# Patient Record
Sex: Female | Born: 1980 | Race: Black or African American | Hispanic: No | Marital: Single | Smoking: Never smoker
Health system: Southern US, Community
[De-identification: ages and names within clinical notes are randomized; demographics above are authoritative.]

## PROBLEM LIST (undated history)

## (undated) DIAGNOSIS — E119 Type 2 diabetes mellitus without complications: Secondary | ICD-10-CM

## (undated) DIAGNOSIS — R112 Nausea with vomiting, unspecified: Secondary | ICD-10-CM

## (undated) DIAGNOSIS — Z98811 Dental restoration status: Secondary | ICD-10-CM

## (undated) DIAGNOSIS — D352 Benign neoplasm of pituitary gland: Secondary | ICD-10-CM

## (undated) DIAGNOSIS — Z9889 Other specified postprocedural states: Secondary | ICD-10-CM

## (undated) DIAGNOSIS — F419 Anxiety disorder, unspecified: Secondary | ICD-10-CM

## (undated) DIAGNOSIS — G4730 Sleep apnea, unspecified: Secondary | ICD-10-CM

## (undated) DIAGNOSIS — N63 Unspecified lump in unspecified breast: Secondary | ICD-10-CM

## (undated) DIAGNOSIS — I10 Essential (primary) hypertension: Secondary | ICD-10-CM

## (undated) DIAGNOSIS — K8591 Acute pancreatitis with uninfected necrosis, unspecified: Secondary | ICD-10-CM

## (undated) DIAGNOSIS — T8859XA Other complications of anesthesia, initial encounter: Secondary | ICD-10-CM

## (undated) DIAGNOSIS — J302 Other seasonal allergic rhinitis: Secondary | ICD-10-CM

## (undated) DIAGNOSIS — G43909 Migraine, unspecified, not intractable, without status migrainosus: Secondary | ICD-10-CM

## (undated) HISTORY — DX: Sleep apnea, unspecified: G47.30

## (undated) HISTORY — DX: Essential (primary) hypertension: I10

## (undated) HISTORY — PX: TONSILLECTOMY: SUR1361

## (undated) HISTORY — DX: Benign neoplasm of pituitary gland: D35.2

## (undated) HISTORY — PX: MICROLARYNGOSCOPY WITH LASER: SHX5972

---

## 1999-12-31 ENCOUNTER — Encounter: Payer: Self-pay | Admitting: Otolaryngology

## 1999-12-31 ENCOUNTER — Encounter: Admission: RE | Admit: 1999-12-31 | Discharge: 1999-12-31 | Payer: Self-pay | Admitting: Otolaryngology

## 2000-04-28 ENCOUNTER — Ambulatory Visit (HOSPITAL_BASED_OUTPATIENT_CLINIC_OR_DEPARTMENT_OTHER): Admission: RE | Admit: 2000-04-28 | Discharge: 2000-04-28 | Payer: Self-pay | Admitting: Otolaryngology

## 2000-12-17 ENCOUNTER — Ambulatory Visit (HOSPITAL_BASED_OUTPATIENT_CLINIC_OR_DEPARTMENT_OTHER): Admission: RE | Admit: 2000-12-17 | Discharge: 2000-12-17 | Payer: Self-pay | Admitting: Otolaryngology

## 2000-12-17 ENCOUNTER — Encounter (INDEPENDENT_AMBULATORY_CARE_PROVIDER_SITE_OTHER): Payer: Self-pay | Admitting: *Deleted

## 2001-03-31 ENCOUNTER — Encounter: Payer: Self-pay | Admitting: *Deleted

## 2001-03-31 ENCOUNTER — Emergency Department (HOSPITAL_COMMUNITY): Admission: EM | Admit: 2001-03-31 | Discharge: 2001-03-31 | Payer: Self-pay | Admitting: *Deleted

## 2001-08-03 ENCOUNTER — Ambulatory Visit (HOSPITAL_COMMUNITY): Admission: RE | Admit: 2001-08-03 | Discharge: 2001-08-03 | Payer: Self-pay | Admitting: Family Medicine

## 2001-08-03 ENCOUNTER — Encounter: Payer: Self-pay | Admitting: Family Medicine

## 2001-08-25 ENCOUNTER — Other Ambulatory Visit: Admission: RE | Admit: 2001-08-25 | Discharge: 2001-08-25 | Payer: Self-pay | Admitting: Family Medicine

## 2001-12-31 ENCOUNTER — Emergency Department (HOSPITAL_COMMUNITY): Admission: EM | Admit: 2001-12-31 | Discharge: 2001-12-31 | Payer: Self-pay | Admitting: Emergency Medicine

## 2001-12-31 ENCOUNTER — Encounter: Payer: Self-pay | Admitting: Emergency Medicine

## 2002-01-13 ENCOUNTER — Encounter (HOSPITAL_COMMUNITY): Admission: RE | Admit: 2002-01-13 | Discharge: 2002-02-12 | Payer: Self-pay | Admitting: Family Medicine

## 2002-02-27 ENCOUNTER — Emergency Department (HOSPITAL_COMMUNITY): Admission: EM | Admit: 2002-02-27 | Discharge: 2002-02-27 | Payer: Self-pay | Admitting: Emergency Medicine

## 2003-02-21 ENCOUNTER — Encounter: Payer: Self-pay | Admitting: Family Medicine

## 2003-02-21 ENCOUNTER — Ambulatory Visit (HOSPITAL_COMMUNITY): Admission: RE | Admit: 2003-02-21 | Discharge: 2003-02-21 | Payer: Self-pay | Admitting: Family Medicine

## 2004-08-22 ENCOUNTER — Ambulatory Visit: Payer: Self-pay | Admitting: Family Medicine

## 2004-10-23 ENCOUNTER — Ambulatory Visit: Payer: Self-pay | Admitting: Family Medicine

## 2004-12-23 ENCOUNTER — Ambulatory Visit: Payer: Self-pay | Admitting: Family Medicine

## 2004-12-24 ENCOUNTER — Encounter (INDEPENDENT_AMBULATORY_CARE_PROVIDER_SITE_OTHER): Payer: Self-pay | Admitting: *Deleted

## 2004-12-24 LAB — CONVERTED CEMR LAB: Pap Smear: NORMAL

## 2004-12-26 ENCOUNTER — Ambulatory Visit (HOSPITAL_COMMUNITY): Admission: RE | Admit: 2004-12-26 | Discharge: 2004-12-26 | Payer: Self-pay | Admitting: Family Medicine

## 2005-03-28 ENCOUNTER — Ambulatory Visit: Payer: Self-pay | Admitting: Family Medicine

## 2005-04-29 ENCOUNTER — Ambulatory Visit: Payer: Self-pay | Admitting: Family Medicine

## 2005-05-06 ENCOUNTER — Ambulatory Visit (HOSPITAL_COMMUNITY): Admission: RE | Admit: 2005-05-06 | Discharge: 2005-05-06 | Payer: Self-pay | Admitting: Family Medicine

## 2005-07-28 ENCOUNTER — Ambulatory Visit: Payer: Self-pay | Admitting: Family Medicine

## 2006-06-10 ENCOUNTER — Ambulatory Visit: Payer: Self-pay | Admitting: Family Medicine

## 2006-10-27 ENCOUNTER — Ambulatory Visit (HOSPITAL_COMMUNITY): Admission: RE | Admit: 2006-10-27 | Discharge: 2006-10-27 | Payer: Self-pay | Admitting: Family Medicine

## 2006-10-27 ENCOUNTER — Ambulatory Visit: Payer: Self-pay | Admitting: Family Medicine

## 2006-12-20 ENCOUNTER — Emergency Department (HOSPITAL_COMMUNITY): Admission: EM | Admit: 2006-12-20 | Discharge: 2006-12-20 | Payer: Self-pay | Admitting: Emergency Medicine

## 2007-01-14 ENCOUNTER — Ambulatory Visit: Payer: Self-pay | Admitting: Family Medicine

## 2007-01-14 LAB — HM MAMMOGRAPHY: HM Mammogram: NORMAL

## 2007-01-16 ENCOUNTER — Encounter: Payer: Self-pay | Admitting: Family Medicine

## 2007-02-23 ENCOUNTER — Ambulatory Visit: Payer: Self-pay | Admitting: Family Medicine

## 2007-10-21 ENCOUNTER — Encounter: Payer: Self-pay | Admitting: Family Medicine

## 2008-02-08 ENCOUNTER — Ambulatory Visit: Payer: Self-pay | Admitting: Family Medicine

## 2008-02-17 ENCOUNTER — Encounter (INDEPENDENT_AMBULATORY_CARE_PROVIDER_SITE_OTHER): Payer: Self-pay | Admitting: *Deleted

## 2008-02-17 DIAGNOSIS — D352 Benign neoplasm of pituitary gland: Secondary | ICD-10-CM | POA: Insufficient documentation

## 2008-02-17 DIAGNOSIS — D353 Benign neoplasm of craniopharyngeal duct: Secondary | ICD-10-CM

## 2008-02-17 DIAGNOSIS — J329 Chronic sinusitis, unspecified: Secondary | ICD-10-CM | POA: Insufficient documentation

## 2008-02-17 DIAGNOSIS — E669 Obesity, unspecified: Secondary | ICD-10-CM | POA: Insufficient documentation

## 2008-02-17 DIAGNOSIS — J302 Other seasonal allergic rhinitis: Secondary | ICD-10-CM | POA: Insufficient documentation

## 2008-02-17 DIAGNOSIS — N62 Hypertrophy of breast: Secondary | ICD-10-CM | POA: Insufficient documentation

## 2008-02-17 DIAGNOSIS — J3089 Other allergic rhinitis: Secondary | ICD-10-CM | POA: Insufficient documentation

## 2008-05-03 ENCOUNTER — Encounter: Payer: Self-pay | Admitting: Family Medicine

## 2008-05-03 LAB — CONVERTED CEMR LAB
ALT: 20 units/L (ref 0–35)
AST: 15 units/L (ref 0–37)
Albumin: 4.4 g/dL (ref 3.5–5.2)
Alkaline Phosphatase: 39 units/L (ref 39–117)
BUN: 10 mg/dL (ref 6–23)
Basophils Absolute: 0.1 10*3/uL (ref 0.0–0.1)
Basophils Relative: 1 % (ref 0–1)
Bilirubin, Direct: 0.1 mg/dL (ref 0.0–0.3)
CO2: 22 meq/L (ref 19–32)
Calcium: 9.7 mg/dL (ref 8.4–10.5)
Chloride: 105 meq/L (ref 96–112)
Cholesterol: 177 mg/dL (ref 0–200)
Creatinine, Ser: 0.68 mg/dL (ref 0.40–1.20)
Eosinophils Absolute: 0.1 10*3/uL (ref 0.0–0.7)
Eosinophils Relative: 2 % (ref 0–5)
Glucose, Bld: 94 mg/dL (ref 70–99)
HCT: 41.5 % (ref 36.0–46.0)
HDL: 37 mg/dL — ABNORMAL LOW (ref >39)
Hemoglobin: 12.9 g/dL (ref 12.0–15.0)
Indirect Bilirubin: 0.5 mg/dL (ref 0.0–0.9)
LDL Cholesterol: 121 mg/dL — ABNORMAL HIGH (ref 0–99)
Lymphocytes Relative: 45 % (ref 12–46)
Lymphs Abs: 2.6 10*3/uL (ref 0.7–4.0)
MCHC: 31.1 g/dL (ref 30.0–36.0)
MCV: 80.9 fL (ref 78.0–100.0)
Monocytes Absolute: 0.5 10*3/uL (ref 0.1–1.0)
Monocytes Relative: 8 % (ref 3–12)
Neutro Abs: 2.6 10*3/uL (ref 1.7–7.7)
Neutrophils Relative %: 44 % (ref 43–77)
Platelets: 402 10*3/uL — ABNORMAL HIGH (ref 150–400)
Potassium: 3.9 meq/L (ref 3.5–5.3)
Prolactin: 84.3 ng/mL
RBC: 5.13 M/uL — ABNORMAL HIGH (ref 3.87–5.11)
RDW: 13.4 % (ref 11.5–15.5)
Sodium: 141 meq/L (ref 135–145)
TSH: 2.292 microintl units/mL (ref 0.350–4.50)
Total Bilirubin: 0.6 mg/dL (ref 0.3–1.2)
Total CHOL/HDL Ratio: 4.8
Total Protein: 7 g/dL (ref 6.0–8.3)
Triglycerides: 94 mg/dL (ref <150)
VLDL: 19 mg/dL (ref 0–40)
WBC: 5.8 10*3/uL (ref 4.0–10.5)

## 2008-05-04 ENCOUNTER — Ambulatory Visit: Payer: Self-pay | Admitting: Family Medicine

## 2008-05-04 ENCOUNTER — Encounter: Payer: Self-pay | Admitting: Family Medicine

## 2008-05-04 ENCOUNTER — Other Ambulatory Visit: Admission: RE | Admit: 2008-05-04 | Discharge: 2008-05-04 | Payer: Self-pay | Admitting: Family Medicine

## 2008-05-06 ENCOUNTER — Encounter: Payer: Self-pay | Admitting: Family Medicine

## 2008-05-06 LAB — CONVERTED CEMR LAB
Candida species: NEGATIVE
Chlamydia, DNA Probe: NEGATIVE
GC Probe Amp, Genital: NEGATIVE
Gardnerella vaginalis: NEGATIVE
Trichomonal Vaginitis: NEGATIVE

## 2008-05-10 LAB — CONVERTED CEMR LAB: Pap Smear: NORMAL

## 2008-05-11 ENCOUNTER — Ambulatory Visit (HOSPITAL_COMMUNITY): Admission: RE | Admit: 2008-05-11 | Discharge: 2008-05-11 | Payer: Self-pay | Admitting: Family Medicine

## 2008-05-31 ENCOUNTER — Ambulatory Visit: Payer: Self-pay | Admitting: Endocrinology

## 2008-05-31 DIAGNOSIS — R519 Headache, unspecified: Secondary | ICD-10-CM | POA: Insufficient documentation

## 2008-05-31 DIAGNOSIS — E229 Hyperfunction of pituitary gland, unspecified: Secondary | ICD-10-CM | POA: Insufficient documentation

## 2008-05-31 DIAGNOSIS — R51 Headache: Secondary | ICD-10-CM

## 2008-05-31 DIAGNOSIS — N643 Galactorrhea not associated with childbirth: Secondary | ICD-10-CM | POA: Insufficient documentation

## 2008-06-05 ENCOUNTER — Telehealth (INDEPENDENT_AMBULATORY_CARE_PROVIDER_SITE_OTHER): Payer: Self-pay | Admitting: *Deleted

## 2008-07-03 ENCOUNTER — Ambulatory Visit: Payer: Self-pay | Admitting: Endocrinology

## 2008-07-17 ENCOUNTER — Ambulatory Visit: Payer: Self-pay | Admitting: Family Medicine

## 2008-07-17 DIAGNOSIS — N949 Unspecified condition associated with female genital organs and menstrual cycle: Secondary | ICD-10-CM

## 2008-07-17 DIAGNOSIS — N925 Other specified irregular menstruation: Secondary | ICD-10-CM | POA: Insufficient documentation

## 2008-07-17 DIAGNOSIS — I10 Essential (primary) hypertension: Secondary | ICD-10-CM | POA: Insufficient documentation

## 2008-07-17 DIAGNOSIS — N938 Other specified abnormal uterine and vaginal bleeding: Secondary | ICD-10-CM | POA: Insufficient documentation

## 2008-08-22 ENCOUNTER — Encounter: Payer: Self-pay | Admitting: Endocrinology

## 2008-08-24 ENCOUNTER — Encounter: Payer: Self-pay | Admitting: Family Medicine

## 2008-08-24 ENCOUNTER — Ambulatory Visit: Payer: Self-pay | Admitting: Endocrinology

## 2008-08-24 LAB — CONVERTED CEMR LAB: Prolactin: 92.8 ng/mL

## 2008-09-19 ENCOUNTER — Ambulatory Visit: Payer: Self-pay | Admitting: Family Medicine

## 2008-09-19 DIAGNOSIS — M542 Cervicalgia: Secondary | ICD-10-CM | POA: Insufficient documentation

## 2009-05-01 ENCOUNTER — Ambulatory Visit: Payer: Self-pay | Admitting: Family Medicine

## 2009-05-06 DIAGNOSIS — J0100 Acute maxillary sinusitis, unspecified: Secondary | ICD-10-CM | POA: Insufficient documentation

## 2009-07-26 ENCOUNTER — Ambulatory Visit: Payer: Self-pay | Admitting: Family Medicine

## 2009-07-26 DIAGNOSIS — N760 Acute vaginitis: Secondary | ICD-10-CM | POA: Insufficient documentation

## 2009-07-26 DIAGNOSIS — R5383 Other fatigue: Secondary | ICD-10-CM

## 2009-07-26 DIAGNOSIS — R5381 Other malaise: Secondary | ICD-10-CM | POA: Insufficient documentation

## 2009-07-27 ENCOUNTER — Encounter: Payer: Self-pay | Admitting: Family Medicine

## 2009-07-27 LAB — CONVERTED CEMR LAB
Chlamydia, DNA Probe: NEGATIVE
GC Probe Amp, Genital: NEGATIVE

## 2009-07-28 ENCOUNTER — Encounter: Payer: Self-pay | Admitting: Family Medicine

## 2009-08-03 ENCOUNTER — Encounter: Payer: Self-pay | Admitting: Family Medicine

## 2009-08-08 LAB — CONVERTED CEMR LAB
Candida species: NEGATIVE
Gardnerella vaginalis: POSITIVE — AB

## 2009-08-21 ENCOUNTER — Telehealth: Payer: Self-pay | Admitting: Family Medicine

## 2009-10-24 ENCOUNTER — Encounter: Payer: Self-pay | Admitting: Family Medicine

## 2009-10-24 ENCOUNTER — Encounter: Payer: Self-pay | Admitting: Endocrinology

## 2009-10-25 ENCOUNTER — Other Ambulatory Visit: Admission: RE | Admit: 2009-10-25 | Discharge: 2009-10-25 | Payer: Self-pay | Admitting: Family Medicine

## 2009-10-25 ENCOUNTER — Ambulatory Visit: Payer: Self-pay | Admitting: Family Medicine

## 2009-11-12 ENCOUNTER — Telehealth: Payer: Self-pay | Admitting: Family Medicine

## 2009-11-13 ENCOUNTER — Ambulatory Visit: Payer: Self-pay | Admitting: Endocrinology

## 2009-11-14 ENCOUNTER — Encounter: Payer: Self-pay | Admitting: Family Medicine

## 2010-08-27 ENCOUNTER — Telehealth: Payer: Self-pay | Admitting: Family Medicine

## 2010-08-27 ENCOUNTER — Emergency Department (HOSPITAL_COMMUNITY): Admission: EM | Admit: 2010-08-27 | Discharge: 2010-08-27 | Payer: Self-pay | Admitting: Emergency Medicine

## 2010-09-27 ENCOUNTER — Telehealth (INDEPENDENT_AMBULATORY_CARE_PROVIDER_SITE_OTHER): Payer: Self-pay | Admitting: *Deleted

## 2010-10-02 ENCOUNTER — Telehealth (INDEPENDENT_AMBULATORY_CARE_PROVIDER_SITE_OTHER): Payer: Self-pay | Admitting: *Deleted

## 2010-10-16 ENCOUNTER — Ambulatory Visit: Payer: Self-pay | Admitting: Family Medicine

## 2010-10-17 LAB — HM MAMMOGRAPHY

## 2010-11-10 ENCOUNTER — Encounter: Payer: Self-pay | Admitting: Family Medicine

## 2010-11-19 NOTE — Letter (Signed)
Summary: x rays  x rays   Imported By: Lind Guest 03/01/2010 14:18:34  _____________________________________________________________________  External Attachment:    Type:   Image     Comment:   External Document

## 2010-11-19 NOTE — Assessment & Plan Note (Signed)
Summary: f/u appt/#/cd   Vital Signs:  Patient profile:   30 year old female Menstrual status:  irregular Height:      64.5 inches (163.83 cm) Weight:      209.38 pounds (95.17 kg) O2 Sat:      97 % on Room air Temp:     97.5 degrees F (36.39 degrees C) oral Pulse rate:   94 / minute BP sitting:   118 / 72  (left arm) Cuff size:   large  Vitals Entered By: Josph Macho CMA (November 13, 2009 1:12 PM)  O2 Flow:  Room air CC: Follow-up visit/ CF   Referring Provider:  Dr. Lodema Hong Primary Provider:  Syliva Overman MD  CC:  Follow-up visit/ CF.  History of Present Illness: pt has been off cabergoline "for a long time--i do not even remember."  she still has intermittent headache.  she says she is not as risk for pregnancy, and does not wish to have a pregnancy.  she denies galactorrhea.  her last menstrual period was 2005.    Current Medications (verified): 1)  Maxzide-25 37.5-25 Mg Tabs (Triamterene-Hctz) .... Take 1 Tablet By Mouth Once A Day 2)  Ibuprofen 800 Mg Tabs (Ibuprofen) .... Take 1 Tablet By Mouth Two Times A Day As Needed Maximum Twice Weekly For Severe Headache1 3)  Restoril 30 Mg Caps (Temazepam) .... Take 1 Capsule By Mouth At Bedtime 4)  Singulair 10 Mg Tabs (Montelukast Sodium) .... Take 1 Tablet By Mouth Once A Day 5)  Astepro 0.15 % Soln (Azelastine Hcl) .... 2 Puffs Per Nostril Twice Daily  Allergies (verified): 1)  ! 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    Physical Exam  General:  obese.  no distress  Msk:  gait is normal and steady. Extremities:  no edema. Additional Exam:  tsh=2.4 prolactin=115   Impression & Recommendations:  Problem # 1:  HYPERPROLACTINEMIA (ICD-253.1) needs  increased rx  Medications Added to Medication List This Visit: 1)  Cabergoline 0.5 Mg Tabs (Cabergoline) .Marland Kitchen.. 1 tab twice a week  Other Orders: Est. Patient Level III (04540)  Patient Instructions: 1)  resume cabergoline 1 mg twice a week (the first 2 weeks, take just 1/2 pill twice a week). 2)  return here 6 weeks.   3)  cc dr cousins and to [facility]. 4)  please note the side effects of the cabergoline:  nausea and dizziness--they resolve with time.  this is why you should start with 1/2 pill at a time. 5)  you should also be prepared for possible resumption of your menstrual periods and fertility.   Prescriptions: CABERGOLINE 0.5 MG TABS (CABERGOLINE) 1 tab twice a week  #10 x 5   Entered and Authorized by:   Minus Breeding MD   Signed by:   Minus Breeding MD on 11/13/2009   Method used:   [facility] [address] [phone]* (retail)       [address]       [place], [place]  [address]       Ph: [phone] or [phone]       Fax: [phone]   RxID:   [id number]

## 2010-11-19 NOTE — Assessment & Plan Note (Signed)
Summary: office visit   Vital Signs:  Patient profile:   30 year old female Menstrual status:  irregular Height:      64.5 inches Weight:      204.56 pounds BMI:     34.70 O2 Sat:      97 % on Room air Pulse rate:   89 / minute Pulse rhythm:   regular Resp:     16 per minute BP sitting:   140 / 100  (left arm) Cuff size:   large  Vitals Entered By: Everitt Amber (May 01, 2009 1:57 PM)  Nutrition Counseling: Patient's BMI is greater than 25 and therefore counseled on weight management options.  O2 Flow:  Room air CC: she has been having extremely bad headaches that began yesterday in the forehead area and sometimes on the side Is Patient Diabetic? No  years   days  Menstrual Status irregular Last PAP Result normal   Primary Care Provider:  Syliva Overman MD  CC:  she has been having extremely bad headaches that began yesterday in the forehead area and sometimes on the side.  History of Present Illness: 2 day h/o disabling frontal headache, pounding, associated with nausea, photo and phonphobia. She is unable to work as a result of her pain. She denies localised weakness weakness or numbness. She denies using prophylactic meds for her jeadaches and seems surprised to hear about this option, which I seriouslydoubt as she has seen me on several occasions about her headaches as well as 2 disfferent neurologists. The impt of compliance with prophylaxis is stressed. She dneies any recent fever or chills. She has been having incsinus pressure with yellow post nasal drainage in the past week. She is inconsistent in lifstyle change and has gained weight.    Current Medications (verified): 1)  Benadryl 25 Mg  Tabs (Diphenhydramine Hcl) .... Take 1 Qhs 2)  Klor-Con M20 20 Meq  Cr-Tabs (Potassium Chloride Crys Cr) .... Take 1 By Mouth Qd 3)  Hydrochlorothiazide 25 Mg  Tabs (Hydrochlorothiazide) .... Take 1 By Mouth Qd  Allergies (verified): 1)  ! Parlodel (Bromocriptine  Mesylate)  Review of Systems      See HPI ENT:  Complains of nasal congestion and sinus pressure; chronic stuffiness and drainage with right max pressure, yellow nasal drainage for 1 week. CV:  Denies chest pain or discomfort, difficulty breathing while lying down, swelling of feet, and swelling of hands. Resp:  Complains of cough and sputum productive; clear phlegm. GI:  Complains of nausea; denies abdominal pain, constipation, diarrhea, and vomiting. GU:  Denies dysuria and urinary frequency. MS:  Denies joint pain and stiffness. Neuro:  Complains of headaches; denies poor balance, seizures, sensation of room spinning, and tingling; 2 day h/o uncontrolled 8 plus headache pressure, nasal area and occiput, nausea, phono and photophobia no emesis, disab;ing, she has been using 2 advil daily for weeks/months, no prophylaxis, no specific migraine med.Marland Kitchen Psych:  Denies anxiety and easily angered. Allergy:  Complains of itching eyes, seasonal allergies, and sneezing; year- round uncontrolled, has had normal allergy testing.  Physical Exam  General:  alert, well-hydrated, and overweight-appearing.  HEENT: No facial asymmetry,  EOMI,R maxillary sinus tenderness, TM's Clear, oropharynx  pink and moist.   Chest: Clear to auscultation bilaterally.  CVS: S1, S2, No murmurs, No S3.   Abd: Soft, Nontender.  MS: Adequate ROM spine, hips, shoulders and knees.  Ext: No edema.   CNS: CN 2-12 intact, power tone and sensation normal throughout.  Skin: Intact, no visible lesions or rashes.  Psych: Good eye contact, normal affect.  Memory intact, not anxious or depressed appearing.     Impression & Recommendations:  Problem # 1:  HEADACHE (ICD-784.0) Assessment Deteriorated  The following medications were removed from the medication list:    Ibuprofen 800 Mg Tabs (Ibuprofen) .Marland Kitchen... Take 1 tablet by mouth two times a day Her updated medication list for this problem includes:    Ibuprofen 800 Mg Tabs  (Ibuprofen) .Marland Kitchen... Take 1 tablet by mouth two times a day as needed maximum twice weekly for severe headache1  Orders: Depo- Medrol 80mg  (J1040) Ketorolac-Toradol 15mg  (Z6109) Admin of Therapeutic Inj  intramuscular or subcutaneous (60454)  Problem # 2:  HYPERTENSION, ESSENTIAL (ICD-401.9) Assessment: Deteriorated  The following medications were removed from the medication list:    Hydrochlorothiazide 25 Mg Tabs (Hydrochlorothiazide) .Marland Kitchen... Take 1 by mouth qd Her updated medication list for this problem includes:    Maxzide-25 37.5-25 Mg Tabs (Triamterene-hctz) .Marland Kitchen... Take 1 tablet by mouth once a day  BP today: 140/100 Prior BP: 124/90 (09/19/2008)  Labs Reviewed: K+: 3.9 (05/03/2008) Creat: : 0.68 (05/03/2008)   Chol: 177 (05/03/2008)   HDL: 37 (05/03/2008)   LDL: 121 (05/03/2008)   TG: 94 (05/03/2008)  Problem # 3:  OBESITY (ICD-278.00) Assessment: Deteriorated  Orders: T-Lipid Profile (09811-91478)  Ht: 64.5 (05/01/2009)   Wt: 204.56 (05/01/2009)   BMI: 34.70 (05/01/2009)  Problem # 4:  ACUTE MAXILLARY SINUSITIS (ICD-461.0) Assessment: Comment Only  Her updated medication list for this problem includes:    Septra Ds 800-160 Mg Tabs (Sulfamethoxazole-trimethoprim) .Marland Kitchen... Take 1 tablet by mouth two times a day  Complete Medication List: 1)  Benadryl 25 Mg Tabs (Diphenhydramine hcl) .... Take 1 qhs 2)  Prednisone (pak) 5 Mg Tabs (Prednisone) .... Use as directed 3)  Topiramate 25 Mg Tabs (Topiramate) .... Take 1 tablet by mouth two times a day 4)  Septra Ds 800-160 Mg Tabs (Sulfamethoxazole-trimethoprim) .... Take 1 tablet by mouth two times a day 5)  Maxzide-25 37.5-25 Mg Tabs (Triamterene-hctz) .... Take 1 tablet by mouth once a day 6)  Fluconazole 150 Mg Tabs (Fluconazole) .... Take 1 tablet by mouth once a day as needed 7)  Ibuprofen 800 Mg Tabs (Ibuprofen) .... Take 1 tablet by mouth two times a day as needed maximum twice weekly for severe headache1  Other  Orders: T-Basic Metabolic Panel [phone]) T-CBC w/Diff ([phone])  Patient Instructions: 1)  Please schedule a complete physical exam  in 2 months. 2)  It is important that you exercise regularly at least 30 minutes 6 times a week. If you develop chest pain, have severe difficulty breathing, or feel very tired , stop exercising immediately and seek medical attention. 3)  You need to lose weight. Consider a lower calorie diet and regular exercise.  4)  You are being treated for migraines, sinusitis and uncontrolled blood pressure. Your new BP med is sent in , pls d/c the HCTZ and potassium. 5)  pls start a headache diary Prescriptions: IBUPROFEN 800 MG TABS (IBUPROFEN) Take 1 tablet by mouth two times a day as needed maximum twice weekly for severe headache1  #40 x 1   Entered and Authorized by:   Syliva Overman MD   Signed by:   Syliva Overman MD on 05/01/2009   Method used:   [facility]        [facility] [phone][facility] (retail)       [address]  [place], [place]  [address]       Ph: [phone] or [phone]       Fax: [phone]   RxID:   [id number] FLUCONAZOLE 150 MG TABS (FLUCONAZOLE) Take 1 tablet by mouth once a day as needed  #3 x 0   Entered and Authorized by:   Syliva Overman MD   Signed by:   Syliva Overman MD on 05/01/2009   Method used:   [facility]ronically to        [facility] [phone]* (retail)       [address]       [place], [place]  [address]       Ph: [phone] or [phone]       Fax: [phone]   RxID:   [id number] MAXZIDE-25 37.5-25 MG TABS (TRIAMTERENE-HCTZ) Take 1 tablet by mouth once a day  #30 x 3   Entered and Authorized by:   Syliva Overman MD   Signed by:   Syliva Overman MD on 05/01/2009   Method used:   [facility]        [facility] [address] [phone]* (retail)       [address]       [place], [place]  [address]       Ph: [phone] or [phone]        Fax: [phone]   RxID:   [id number] SEPTRA DS 800-160 MG TABS (SULFAMETHOXAZOLE-TRIMETHOPRIM) Take 1 tablet by mouth two times a day  #20 x 0   Entered and Authorized by:   Syliva Overman MD   Signed by:   Syliva Overman MD on 05/01/2009   Method used:   [facility]        [facility] [address] [phone]* (retail)       [address]       [place], [place]  [address]       Ph: [phone] or [phone]       Fax: [phone]   RxID:   [id number] TOPIRAMATE 25 MG TABS (TOPIRAMATE) Take 1 tablet by mouth two times a day  #60 x 3   Entered and Authorized by:   Syliva Overman MD   Signed by:   Syliva Overman MD on 05/01/2009   Method used:   [facility]ronically to        [facility] [phone]* (retail)       [address]       [place], [place]  [address]       Ph: [phone] or [phone]       Fax: [phone]   RxID:   [id number] PREDNISONE (PAK) 5 MG TABS (PREDNISONE) Use as directed  #21 x 0   Entered and Authorized by:   Syliva Overman MD   Signed by:   Syliva Overman MD on 05/01/2009   Method used:   Electronically to        [facility] [phone]* (retail)       [address]       [place], [place]  [address]       Ph: [phone] or [phone]       Fax: [phone]   RxID:   [id number]    Medication Administration  Injection #  1:    Medication: Depo- Medrol 80mg     Diagnosis: HEADACHE (ICD-784.0)    Route: IM    Site: RUOQ gluteus    Exp Date: 12/12    Lot #: [id number]    Mfr: Pharmacia    Patient tolerated injection without complications    Given by: Worthy Keeler LPN (May 01, 2009 2:47 PM)  Injection # 2:    Medication: Ketorolac-Toradol 15mg     Diagnosis: HEADACHE (ICD-784.0)    Route: IM    Site: LUOQ gluteus    Exp Date: 11/20/2010    Lot #: [id number]    Mfr: novaplus    Comments: toradol 60mg  given    Patient tolerated injection without complications    Given by: Worthy Keeler  LPN (May 01, 2009 2:48 PM)  Orders Added: 1)  Est. Patient Level IV [99214] 2)  T-Basic Metabolic Panel [45409-81191] 3)  T-Lipid Profile [80061-22930] 4)  T-CBC w/Diff [478[address]-56213] 5)  Depo- Medrol 80mg  [J1040] 6)  Ketorolac-Toradol 15mg  [J1885] 7)  Admin of Therapeutic Inj  intramuscular or subcutaneous [08657]

## 2010-11-19 NOTE — Letter (Signed)
Summary: office notes  office notes   Imported By: Lind Guest 03/01/2010 14:15:47  _____________________________________________________________________  External Attachment:    Type:   Image     Comment:   External Document

## 2010-11-19 NOTE — Assessment & Plan Note (Signed)
Summary: office visit   Vital Signs:  Patient Profile:   30 Years Old Female Height:     64.5 inches Weight:      197 pounds O2 Sat:      98 % Pulse rate:   120 / minute Resp:     16 per minute BP sitting:   124 / 90  (left arm)  Vitals Entered By: Everitt Amber (September 19, 2008 4:45 PM)                 Referred by:  Dr. Lodema Hong PCP:  Syliva Overman MD  Chief Complaint:  Follow up and neck and head hurting for almost 2 weeks.  History of Present Illness: Pt. c/o increased headache, primarily posterior assoc with neck spasm. She denies vision loss or muscle weakness. The pt denies depression , anxiety or insomnia. Lowella Bandy has been very inconsitent in lifestyle changes foir weight loss with no real change in her weight, she understands the need to change this behavior.     Current Allergies: ! PARLODEL (BROMOCRIPTINE MESYLATE)     Review of Systems  ENT      Denies hoarseness and nasal congestion.  CV      Denies chest pain or discomfort, palpitations, and swelling of feet.  Resp      Denies cough, sputum productive, and wheezing.  GI      Denies abdominal pain, constipation, diarrhea, nausea, and vomiting.  GU      Complains of abnormal vaginal bleeding.      Denies dysuria and urinary hesitancy.  MS      Complains of joint pain and stiffness.  Neuro      Complains of headaches.      Denies falling down, seizures, and sensation of room spinning.  Psych      Denies anxiety and depression.  Endo      Denies cold intolerance, excessive hunger, excessive thirst, excessive urination, heat intolerance, polyuria, and weight change.  Heme      Denies abnormal bruising, bleeding, enlarge lymph nodes, fevers, pallor, and skin discoloration.  Allergy      Complains of seasonal allergies.   Physical Exam  General:     obese in no distress Head:     Normocephalic and atraumatic without obvious abnormalities. No apparent alopecia or balding. Eyes:    vision grossly intact.   Ears:     External ear exam shows no significant lesions or deformities.  Otoscopic examination reveals clear canals, tympanic membranes are intact bilaterally without bulging, retraction, inflammation or discharge. Hearing is grossly normal bilaterally. Nose:     no external deformity and no nasal discharge.   Mouth:     Oral mucosa and oropharynx without lesions or exudates.  Teeth in good repair. Neck:     No deformities, masses, or tenderness noted. Lungs:     Normal respiratory effort, chest expands symmetrically. Lungs are clear to auscultation, no crackles or wheezes. Heart:     Normal rate and regular rhythm. S1 and S2 normal without gallop, murmur, click, rub or other extra sounds. Abdomen:     soft and non-tender.   Msk:     decreased RONM neck with bilateral trapezius spasm Extremities:     No clubbing, cyanosis, edema, or deformity noted with normal full range of motion of all joints.   Neurologic:     alert & oriented X3, cranial nerves II-XII intact, strength normal in all extremities, and sensation intact to light touch.  Skin:     Intact without suspicious lesions or rashes Psych:     Cognition and judgment appear intact. Alert and cooperative with normal attention span and concentration. No apparent delusions, illusions, hallucinations    Impression & Recommendations:  Problem # 1:  HYPERTENSION, ESSENTIAL (ICD-401.9) Assessment: Comment Only  Her updated medication list for this problem includes:    Hydrochlorothiazide 25 Mg Tabs (Hydrochlorothiazide) .Marland Kitchen... Take 1 by mouth qd  Orders: T-Lipid Profile (95621-30865)  BP today: 124/90 Prior BP: 136/92 (08/24/2008)  BP is still uncontrolled, lifestyl modification only at this time  Labs Reviewed: Creat: 0.68 (05/03/2008) Chol: 177 (05/03/2008)   HDL: 37 (05/03/2008)   LDL: 121 (05/03/2008)   TG: 94 (05/03/2008)   Problem # 2:  HEADACHE (ICD-784.0) Assessment:  Deteriorated  Her updated medication list for this problem includes:    Ibuprofen 800 Mg Tabs (Ibuprofen) .Marland Kitchen... Take 1 tablet by mouth two times a day   Problem # 3:  GYNECOMASTIA (ICD-611.1) Assessment: Comment Only pt still awaiting approval from her ins for surgery  Problem # 4:  OBESITY (ICD-278.00) Assessment: Unchanged advised if no significant weight loss, then no more phentermine will be prescribed  Problem # 5:  NECK PAIN (ICD-723.1) Assessment: Comment Only  Her updated medication list for this problem includes:    Ibuprofen 800 Mg Tabs (Ibuprofen) .Marland Kitchen... Take 1 tablet by mouth two times a day    Flexeril 10 Mg Tabs (Cyclobenzaprine hcl) .Marland Kitchen... Take 1 tab by mouth at bedtime as needed   Complete Medication List: 1)  Benadryl 25 Mg Tabs (Diphenhydramine hcl) .... Take 1 qhs 2)  Klor-con M20 20 Meq Cr-tabs (Potassium chloride crys cr) .... Take 1 by mouth qd 3)  Hydrochlorothiazide 25 Mg Tabs (Hydrochlorothiazide) .... Take 1 by mouth qd 4)  Cabergoline 0.5 Mg Tabs (Cabergoline) .Marland Kitchen.. 1 tab biw 5)  Phentermine Hcl 37.5 Mg Caps (Phentermine hcl) .... Take 1 capsule by mouth once a day 6)  Ibuprofen 800 Mg Tabs (Ibuprofen) .... Take 1 tablet by mouth two times a day 7)  Flexeril 10 Mg Tabs (Cyclobenzaprine hcl) .... Take 1 tab by mouth at bedtime as needed   Patient Instructions: 1)  Please schedule a follow-up appointment in 4 months. 2)  It is important that you exercise regularly at least 20 minutes 5 times a week. If you develop chest pain, have severe difficulty breathing, or feel very tired , stop exercising immediately and seek medical attention. 3)  You need to lose weight. Consider a lower calorie diet and regular exercise.  4)  BP today 1s124/90, this needs to be lower. 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  6)  Lipid Panel prior to visit,  ICD-9:   Prescriptions: FLEXERIL 10 MG TABS (CYCLOBENZAPRINE HCL) Take 1 tab by mouth at bedtime as needed  #30 x 3   Entered by:   Everitt Amber   Authorized by:   Syliva Overman MD   Signed by:   Syliva Overman MD on 09/24/2008   Method used:   Handwritten   RxID:   [id number] IBUPROFEN 800 MG TABS (IBUPROFEN) Take 1 tablet by mouth two times a day  #40 x 1   Entered and Authorized by:   Syliva Overman MD   Signed by:   Syliva Overman MD on 09/24/2008   Method used:   Handwritten   RxID:  [id number]  ]  

## 2010-11-19 NOTE — Progress Notes (Signed)
Summary: flu  Phone Note Call from Patient   Summary of Call: thinks she has the flu   Initial call taken by: Lind Guest,  August 27, 2010 8:07 AM  Follow-up for Phone Call        patient was advised to go to er or urgent care Follow-up by: Adella Hare LPN,  August 27, 2010 10:05 AM

## 2010-11-19 NOTE — Letter (Signed)
Summary: Out of Work  All     ,     Phone:   Fax:     May 01, 2009   Employee:  [employer]OWRON    To Whom It May Concern:   For Medical reasons, please excuse the above named employee from work for the following dates:  Start:   05/01/09 End:   05/02/09  If you need additional information, please feel free to contact our office.         Sincerely,    Worthy Keeler LPN

## 2010-11-19 NOTE — Letter (Signed)
Summary: x rays  x rays   Imported By: Lind Guest 03/01/2010 14:12:04  _____________________________________________________________________  External Attachment:    Type:   Image     Comment:   External Document

## 2010-11-19 NOTE — Letter (Signed)
Summary: consults  consults   Imported By: Lind Guest 03/01/2010 14:06:46  _____________________________________________________________________  External Attachment:    Type:   Image     Comment:   External Document

## 2010-11-19 NOTE — Progress Notes (Signed)
Summary: referral  Phone Note Other Incoming   Caller: dr simpsonm Details for Reason: needs appt with endo Summary of Call: pls contact the pt, let her know i discussed her ase with dr cousins, she needs to see the endocrinologist about her lack of a period , she can either return to dr. Everardo All wo has seen her in the past , or get anew appt with another endo Dr. Lurene Shadow, unless she takes the treatment form the endo, her periods will not improve. It is very impt that you speak directly with her , if you need to get me to the phone, if cant get her lv her a msg and mail letter for her to call us pls Initial call taken by: Syliva Overman MD,  August 21, 2009 9:29 AM  Follow-up for Phone Call        called pt and told her what message said. she stated that she would go to dr. Everardo All and would call his office and make appt and call me back with date and time. pt aware Follow-up by: Rudene Anda,  August 22, 2009 4:24 PM

## 2010-11-19 NOTE — Progress Notes (Signed)
Summary:  PARLODEL  causing headcahes  Phone Note Call from Patient Call back at Home Phone [phone]   Caller: Patient/[phone] Call For: dr ellsion Complaint: Headache Summary of Call: per pt call stated the medication  PARLODEL 2.5 MG  TABS (BROMOCRIPTINE MESYLATE) qhs   is  causing  her  to have very bad headache can he  change her medication she stop taking it    [facility] #[id number] [address] [place], [place]  [address] Ph: [phone] or [phone] Fax: [phone] Initial call taken by: Shelbie Proctor,  June 05, 2008 9:12 AM  Follow-up for Phone Call        is this headache different from before the pralodel ??? - she was apparently having headache before taking the parlodel per dr Everardo All Follow-up by: Corwin Levins MD,  June 05, 2008 9:29 AM  Additional Follow-up for Phone Call Additional follow up Details #1::        per pt call the headache are very extreme , only during the  night , sharp pain  Additional Follow-up by: Shelbie Proctor,  June 05, 2008 10:24 AM    Additional Follow-up for Phone Call Additional follow up Details #2::    same question as before ; the question was not answered Follow-up by: Corwin Levins MD,  June 05, 2008 10:33 AM  Additional Follow-up for Phone Call Additional follow up Details #3:: Details for Additional Follow-up Action Taken: ...yes pain is different they are extreme , only during the  night , sharp pain  after she takes the pralodel per pt  Additional Follow-up by: Shelbie Proctor,  June 05, 2008 10:38 AM  ok to stop the parlodel for now. will let dr Everardo All know  dr Jonny Ruiz Additional Follow-up by: Shelbie Proctor,  June 05, 2008 11: 07AM called pt to inform left msg on machine of what dr Jonny Ruiz advised   Appended Document:  PARLODEL  causing headcahes please advise her to stop x 1 week, then resume at just 1/2 tab at bedtime ret as scheduled  Appended Document:  PARLODEL  causing  headcahes  called  pt to inform  spoke with pt made aware

## 2010-11-19 NOTE — Progress Notes (Signed)
Summary: DR. COUSINS  DR. COUSINS   Imported By: Lind Guest 12/26/2008 09:38:20  _____________________________________________________________________  External Attachment:    Type:   Image     Comment:   External Document

## 2010-11-19 NOTE — Letter (Signed)
Summary: phone notes  phone notes   Imported By: Lind Guest 03/01/2010 14:10:54  _____________________________________________________________________  External Attachment:    Type:   Image     Comment:   External Document

## 2010-11-19 NOTE — Letter (Signed)
Summary: demo  demo   Imported By: Lind Guest 03/01/2010 14:23:10  _____________________________________________________________________  External Attachment:    Type:   Image     Comment:   External Document

## 2010-11-19 NOTE — Assessment & Plan Note (Signed)
Summary: office visit   Vital Signs:  Patient Profile:   30 Years Old Female Height:     64.5 inches Weight:      196 pounds BMI:     33.24 O2 Sat:      97 % Pulse rate:   97 / minute Resp:     16 per minute BP sitting:   118 / 88  Pt. in pain?   no  Vitals Entered By: Everitt Amber (July 17, 2008 4:21 PM)                  Referred by:  Dr. Lodema Hong PCP:  Syliva Overman MD  Chief Complaint:  Follow up.  History of Present Illness: Kaylee Montgomery is here to f/ou on obesity for which she is now taking phentermine with only a 2 pound weight loss since her last visit. She does report inconsistency in both exercise and dietary change but plans to change this. The pt. is with a new endocrinologist and reports satifaction with her care..she is still awaiting a final decision on surgery for gynecomastia. She still has had no menstrual cycle in several years and is interested in a gynae eval.    Current Allergies: ! PARLODEL (BROMOCRIPTINE MESYLATE)    Risk Factors:  PAP Smear History:     Date of Last PAP Smear:  05/10/2008    Results:  normal    Review of Systems  ENT      Denies nasal congestion, sinus pressure, and sore throat.  CV      Denies chest pain or discomfort, palpitations, shortness of breath with exertion, and swelling of feet.  Resp      Denies cough, shortness of breath, sputum productive, and wheezing.  GI      Denies abdominal pain, constipation, diarrhea, nausea, and vomiting.  GU      Denies dysuria and urinary frequency.  MS      Denies joint pain and stiffness.   Physical Exam  General:     overweight-appearing.   Head:     Normocephalic and atraumatic without obvious abnormalities. No apparent alopecia or balding. Eyes:     vision grossly intact.   Ears:     R ear normal and L ear normal.   Nose:     no external erythema and no nasal discharge.   Mouth:     Oral mucosa and oropharynx without lesions or exudates.  Teeth in good  repair. Neck:     No deformities, masses, or tenderness noted. Lungs:     Normal respiratory effort, chest expands symmetrically. Lungs are clear to auscultation, no crackles or wheezes. Heart:     Normal rate and regular rhythm. S1 and S2 normal without gallop, murmur, click, rub or other extra sounds. Abdomen:     soft and non-tender.   Extremities:     No clubbing, cyanosis, edema, or deformity noted with normal full range of motion of all joints.      Impression & Recommendations:  Problem # 1:  HYPERTENSION, ESSENTIAL (ICD-401.9) Assessment: Improved  Her updated medication list for this problem includes:    Hydrochlorothiazide 25 Mg Tabs (Hydrochlorothiazide) .Marland Kitchen... Take 1 by mouth qd   Problem # 2:  DYSFUNCTIONAL UTERINE BLEEDING (ICD-626.8) Assessment: Comment Only  Orders: Gynecologic Referral (Gyn) pt. haas amenoreah for >4 years. 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 One tab by mouth once daily 2)  Benadryl 25 Mg Tabs (Diphenhydramine hcl) .... Take 1 qhs 3)  Amoxicillin 500 Mg Caps (Amoxicillin) .... Take 2 qd 4)  Klor-con M20 20 Meq Cr-tabs (Potassium chloride crys cr) .... Take 1 by mouth qd 5)  Hydrochlorothiazide 25 Mg Tabs (Hydrochlorothiazide) .... Take 1 by mouth qd 6)  Cabergoline 0.5 Mg Tabs (Cabergoline) .... 1/2 biw 7)  Phentermine Hcl 37.5 Mg Caps (Phentermine hcl) .... Take 1 capsule by mouth once a day   Patient Instructions: 1)  Please schedule a follow-up appointment in 2 months. 2)  It is important that you exercise regularly at least 20 minutes 5 times a week. If you develop chest pain, have severe difficulty breathing, or feel very tired , stop exercising immediately and seek medical attention. 3)  You need to lose weight. Consider a lower calorie diet and regular exercise.  4)  You  are being referred to Dr. Cherly Hensen about your cycles. 5)  Tarfget  weight loss is a minimum of 6 weeks.   Prescriptions: PHENTERMINE HCL 37.5 MG CAPS (PHENTERMINE HCL) Take 1 capsule by mouth once a day  #42 x 0   Entered and Authorized by:   Syliva Overman MD   Signed by:   Worthy Keeler LPN on 95/62/1308   Method used:   Printed then faxed to ...       [facility]  [address] [phone][facility] (retail)       [address]       [place], [place]  [address]       Ph: [phone] or [phone]       Fax: [phone]   RxID:   [id number]  ]  Preventive Care Screening  Pap Smear:    Date:  05/10/2008    Next Due:  05/2009    Results:  normal

## 2010-11-19 NOTE — Progress Notes (Signed)
Summary: needs a letter  Phone Note Call from Patient   Summary of Call: HER AND DR HAD TALKED ABOUT HER SEATING ARRANGEMENTS AT WORK and she needs a letter from the dr about this  call [phone] Initial call taken by: Lind Guest,  November 12, 2009 12:53 PM  Follow-up for Phone Call        needs new seating at work, air is hitting her causing recurrent sinus trouble Follow-up by: Worthy Keeler LPN,  November 12, 2009 1:49 PM  Additional Follow-up for Phone Call Additional follow up Details #1::        letter written and in your box, I will review and signe Additional Follow-up by: Syliva Overman MD,  November 12, 2009 6:47 PM    Additional Follow-up for Phone Call Additional follow up Details #2::    letter typed and provided to doctor Follow-up by: Worthy Keeler LPN,  November 13, 2009 2:59 PM

## 2010-11-19 NOTE — Letter (Signed)
Summary: Letter  Letter   Imported By: Lind Guest 11/14/2009 09:06:35  _____________________________________________________________________  External Attachment:    Type:   Image     Comment:   External Document

## 2010-11-19 NOTE — Assessment & Plan Note (Signed)
Summary: 21-30-DY FU---$50---STC   Vital Signs:  Patient Profile:   30 Years Old Female Height:     64.5 inches Weight:      195.0 pounds O2 Sat:      97 % Temp:     97.4 degrees F oral Pulse rate:   97 / minute BP sitting:   136 / 92  (left arm) Cuff size:   regular  Pt. in pain?   no  Vitals Entered By: Orlan Leavens (August 24, 2008 10:07 AM)                  Referred by:  Dr. Lodema Hong PCP:  Syliva Overman MD  Chief Complaint:  1 month follow-up.  History of Present Illness: pt says she temporarily stopped the cabergoline due to an intercurrent illness, but has otherwise taken it as rx'ed.  other than the illness, has had no side-effects from it.    Prior Medications Reviewed Using: Patient Recall  Current Allergies (reviewed today): ! 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 1 tab biw   Patient Instructions: 1)  increase cabergoline to 1 mg biw 2)  ret 2 mos   Prescriptions: CABERGOLINE 0.5 MG TABS (CABERGOLINE) 1 tab biw  #9 x 11   Entered and Authorized by:   Minus Breeding MD   Signed by:   Minus Breeding MD on 08/27/2008   Method used:   [facility]        [facility] [address] [phone]* (retail)       [address]       [place], [place]  [address]       Ph: [phone] or [phone]       Fax: [phone]   RxID:   [id number]  ]

## 2010-11-19 NOTE — Letter (Signed)
Summary: Letter  Letter   Imported By: Lind Guest 08/03/2009 14:12:19  _____________________________________________________________________  External Attachment:    Type:   Image     Comment:   External Document

## 2010-11-19 NOTE — Assessment & Plan Note (Signed)
Summary: office visit   Vital Signs:  Patient profile:   30 year old female Menstrual status:  irregular Height:      64.5 inches Weight:      206.56 pounds Pulse rate:   99 / minute Pulse rhythm:   regular Resp:     16 per minute BP sitting:   120 / 80  (left arm)  Vitals Entered By: Worthy Keeler LPN (July 26, 2009 3:55 PM) CC: follow-up visit Is Patient Diabetic? No Pain Assessment Patient in pain? no        Primary Care Provider:  Syliva Overman MD  CC:  follow-up visit.  History of Present Illness: Pt has had uncontrolled headache for approx 3 days and also c/o increased frequency and severity of her headaches. She should have been re-evaluated by neurologysome time back, for this problem, but unfortunately did not get to the appointment. Her situation iscompounded by the fact that shehas a pituitary tumor, a past h/o galactorreah and has had no cycle for 6 yrs. She ahs in the past seen endocrine , gynae and neurology for these probs, but nothing has changed. She also c/o worseningupper backand neck pain, i believe this is dueto gynecomastia, and suggest reptd attempt on her part  to gwet clearnace for breast reduction surgery.    Current Medications (verified): 1)  Benadryl 25 Mg  Tabs (Diphenhydramine Hcl) .... Take 1 Qhs 2)  Maxzide-25 37.5-25 Mg Tabs (Triamterene-Hctz) .... Take 1 Tablet By Mouth Once A Day 3)  Ibuprofen 800 Mg Tabs (Ibuprofen) .... Take 1 Tablet By Mouth Two Times A Day As Needed Maximum Twice Weekly For Severe Headache1  Allergies (verified): 1)  ! Parlodel (Bromocriptine Mesylate)  Review of Systems      See HPI General:  Complains of fatigue; denies chills and fever. ENT:  Denies hoarseness, nasal congestion, sinus pressure, and sore throat. CV:  Denies chest pain or discomfort, difficulty breathing while lying down, palpitations, and swelling of feet. Resp:  Denies cough and sputum productive. GI:  Complains of nausea; denies  abdominal pain, constipation, diarrhea, and vomiting. GU:  Complains of discharge; no menses in 6 yrs, known pituitary tumor and headaches.Dyspauerenia. MS:  Complains of thoracic pain; neck andupper back pain, cant get breast reduction surgery done. Neuro:  Complains of headaches; sore feeling in the crownof her head, this Monday she had very bad  headache on Monday, and went to work late, left periorbital pain evry night. Psych:  Denies anxiety and depression. Allergy:  Complains of seasonal allergies.  Physical Exam  General:  Well-developed,obes female in no acute distress; alert,appropriate and cooperative throughout examination. pt in pain from headache. HEENT: No facial asymmetry,  EOMI, No sinus tenderness, TM's Clear, oropharynx  pink and moist.   Chest: Clear to auscultation bilaterally.  CVS: S1, S2, No murmurs, No S3.   Abd: Soft, Nontender.  MS: Adequate ROM spine, hips, shoulders and knees.  Ext: No edema.   CNS: CN 2-12 intact, power tone and sensation normal throughout. Fundoscopy is negative for hemmorhage or papiledema  Skin: Intact, no visible lesions or rashes.  Psych: Good eye contact, normal affect.  Memory intact, not anxious or depressed appearing.    Impression & Recommendations:  Problem # 1:  VULVOVAGINITIS (ICD-616.10) Assessment Comment Only  The following medications were removed from the medication list:    Septra Ds 800-160 Mg Tabs (Sulfamethoxazole-trimethoprim) .Marland Kitchen... Take 1 tablet by mouth two times a day  Orders: T-Wet Prep by [employer]  Probe [id number]) T-Chlamydia & GC Probe, Genital ([id number])  Problem # 2:  HEADACHE (ICD-784.0) Assessment: Deteriorated  Her updated medication list for this problem includes:    Ibuprofen 800 Mg Tabs (Ibuprofen) .Marland Kitchen... Take 1 tablet by mouth two times a day as needed maximum twice weekly for severe headache1 increase dose of topamax and neurology re-eval,   Problem # 3:  HYPERTENSION, ESSENTIAL  (ICD-401.9) Assessment: Improved  Her updated medication list for this problem includes:    Maxzide-25 37.5-25 Mg Tabs (Triamterene-hctz) .Marland Kitchen... Take 1 tablet by mouth once a day  Orders: T-Basic Metabolic Panel [id number])  BP today: 120/80 Prior BP: 140/100 (05/01/2009)  Labs Reviewed: K+: 3.9 (05/03/2008) Creat: : 0.68 (05/03/2008)   Chol: 177 (05/03/2008)   HDL: 37 (05/03/2008)   LDL: 121 (05/03/2008)   TG: 94 (05/03/2008)  Problem # 4:  GYNECOMASTIA (ICD-611.1) Assessment: Unchanged recommend contacting her ins company for approval again, she needs this n medical grounds  Problem # 5:  OBESITY (ICD-278.00) Assessment: Deteriorated  Orders: T-Hepatic Function ([id number])  Ht: 64.5 (07/26/2009)   Wt: 206.56 (07/26/2009)   BMI: 34.70 (05/01/2009)  Complete Medication List: 1)  Benadryl 25 Mg Tabs (Diphenhydramine hcl) .... Take 1 qhs 2)  Maxzide-25 37.5-25 Mg Tabs (Triamterene-hctz) .... Take 1 tablet by mouth once a day 3)  Ibuprofen 800 Mg Tabs (Ibuprofen) .... Take 1 tablet by mouth two times a day as needed maximum twice weekly for severe headache1 4)  Topamax 50 Mg Tabs (Topiramate) .... Take 1 tablet by mouth two times a day  Other Orders: T-Lipid Profile ([id number]) T-CBC w/Diff [id number]) T-TSH [id number]) T-Prolactin [id number]) T-LH [phone]) T-FSH [id number]) T-CBC w/Diff [id number]) T-Prolactin [id number]) T-TSH [phone]) T-LH [phone]) T-FSH [id number]) T-Lipid Profile ([id number]) Neurology Referral (Neuro)  Patient Instructions: 1)  cPE in 2 months 2)  BMP prior to visit, ICD-9: 3)  Hepatic Panel prior to visit, ICD-9: 4)  Lipid Panel prior to visit, ICD-9: 5)  TSH prior to visit, ICD-9: 6)  CBC w/ Diff prior to visit, ICD-9: 7)  prlactin level, FSH and LH 8)  you will be referred to a neurologist and I recommend you call about breast reduction surgery.  9)  I will call gynae about your cycles. 10)  Dose  increase on the topamax Prescriptions: TOPAMAX 50 MG TABS (TOPIRAMATE) Take 1 tablet by mouth two times a day  #60 x 4   Entered and Authorized by:   Syliva Overman MD   Signed by:   Syliva Overman MD on 07/26/2009   Method used:   Electronically to        [facility] [phone]* (retail)       [address]       [place], [place]  [address]       Ph: [phone] or [phone]       Fax: [phone]   RxID:   [id number]

## 2010-11-19 NOTE — Assessment & Plan Note (Signed)
Summary: 1 MTH FU/$50/JSS   Vital Signs:  Patient Profile:   30 Years Old Female Weight:      198.4 pounds Temp:     98.1 degrees F oral Pulse rate:   102 / minute BP sitting:   128 / 90  (left arm) Cuff size:   regular  Pt. in pain?   no  Vitals Entered By: Orlan Leavens (July 03, 2008 10:02 AM)                  Referred by:  Dr. Lodema Hong PCP:  Syliva Overman MD  Chief Complaint:  1 month follow-up/ Had to sto taking Parlodel gave her bad headaches.  History of Present Illness: stopped parlodel due to persistent headache.  no associated n/v.    Current Allergies: ! 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     Physical Exam  General:     obese.   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 1/2 biw   Patient Instructions: 1)  cabergoline 1/2 of 0.5 mg twice a week 2)  ret 21-30d   Prescriptions: CABERGOLINE 0.5 MG TABS (CABERGOLINE) 1/2 biw  #5 x 1   Entered and Authorized by:   Minus Breeding MD   Signed by:   Minus Breeding MD on 07/03/2008   Method used:   [facility]ically to        [facility]  [address] [phone]* (retail)       [address]       [place], [place]  [address]       Ph: [phone] or [phone]       Fax: [phone]   RxID:   [id number]  ]

## 2010-11-19 NOTE — Assessment & Plan Note (Signed)
Summary: NEW ENDO/[employer]/PAPER WORK/$50/PN   Vital Signs:  Patient Profile:   30 Years Old Female Weight:      198.0 pounds Temp:     98.4 degrees F oral Pulse rate:   85 / minute BP sitting:   120 / 86  (left arm) Cuff size:   regular  Pt. in pain?   no  Vitals Entered By: Orlan Leavens (May 31, 2008 9:46 AM)                  Referred by:  Dr. Lodema Hong PCP:  Syliva Overman MD  Chief Complaint:  NEW ENDO.  History of Present Illness: pt states 30d of intermittent moderate bilat galactorrhea.  also amenorrhea x 3 years.  has a 70 year old child.  no h/o infertility or urolithiasis.   also chronic headaches.      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  The patient denies depression.         denies eye pain, palpitations, sob, diarrhea, excessive tremor, anxiety, bruising, rhinorrhea, hypoglycemia.  has occ mild myalgias, and excessive diaphoresis    Physical Exam  General:     obese.   Head:     head is normocephalic eyes: no scleral icterus no periorbital swelling perrl no acromegalic features external ears are normal nose normal externally mouth has no lesion, including normal tongue  Neck:     no masses, thyromegaly, or abnormal cervical nodes Lungs:     clear to auscultation.  no respiratory distress  Heart:     regular rate  and rhythm, S1, S2 without murmurs, rubs, gallops, or clicks Abdomen:     abdomen is soft, nontender.  no hepatosplenomegaly.   not distended.  no hernia  Msk:     no deformity or scoliosis noted with normal posture and gait.  no acromegalic features Neurologic:     cn 2-12 grossly intact.   readily moves all 4's.   sensation is intact to touch on the feet  Skin:     normal texture and temp.  no rash.  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 Take 1 qhs 2)  Amoxicillin 500 Mg Caps (Amoxicillin) .... Take 2 qd 3)  Klor-con M20 20 Meq Cr-tabs (Potassium chloride crys cr) .... Take 1 by mouth qd 4)  Hydrochlorothiazide 25 Mg Tabs (Hydrochlorothiazide) .... Take 1 by mouth qd 5)  Parlodel 2.5 Mg Tabs (Bromocriptine mesylate) .... Qhs   Patient Instructions: 1)  parlodel 2.5 mg 1/2 at bedtime x7 days, then 1 thereafter 2)  ret 30d 3)  pt advised to be prepared for  menstruation and fertility to resume   Prescriptions: PARLODEL 2.5 MG  TABS (BROMOCRIPTINE MESYLATE) qhs  #30 x 1   Entered and Authorized by:   Minus Breeding MD   Signed by:   Minus Breeding MD on 05/31/2008   Method used:   Electronically sent to ...       [facility]. [phone]*       [address]       [place], [place]  [address]       Ph: [phone] or [phone]       Fax: [phone]   RxID:   [id number]  ]

## 2010-11-19 NOTE — Letter (Signed)
Summary: misc  misc   Imported By: Lind Guest 03/01/2010 14:08:36  _____________________________________________________________________  External Attachment:    Type:   Image     Comment:   External Document

## 2010-11-19 NOTE — Letter (Signed)
Summary: labs  labs   Imported By: Lind Guest 03/01/2010 14:13:43  _____________________________________________________________________  External Attachment:    Type:   Image     Comment:   External Document

## 2010-11-19 NOTE — Assessment & Plan Note (Signed)
Summary: physical   Vital Signs:  Patient profile:   30 year old female Menstrual status:  irregular Height:      64.5 inches Weight:      207.50 pounds BMI:     35.19 O2 Sat:      98 % on Room air Pulse rate:   106 / minute Pulse rhythm:   regular Resp:     16 per minute BP sitting:   114 / 84  Vitals Entered By: Everitt Amber (October 25, 2009 4:19 PM)  Nutrition Counseling: Patient's BMI is greater than 25 and therefore counseled on weight management options.  O2 Flow:  Room air CC: CPE  Vision Screening:Left eye w/o correction: 20 / 25 Right Eye w/o correction: 20 / 25 Both eyes w/o correction:  20/ 20  Color vision testing: normal      Vision Entered By: Everitt Amber (October 25, 2009 4:21 PM)   Primary Care Provider:  Syliva Overman MD  CC:  CPE.  History of Present Illness: Reports  that  she has been doing fairly well. she does however have concerns about her headaches which are daily, and are uncoontrolled. She was referred to neurology, states she never heard from the office, and she herself did not call to f/u on the appt. She brings in colored pictures showing the different aras of her head that she experiences pain, which are mainly right sided, from frontal area to occiput and nape. She is still amenorreic , and she does have an upcoming appt withendocrine for f/u on her pituitary tumor she understands that for her menses to return she has to have this under control . Denies recent fever or chills. Denies ear pain or sore throat. Denies chest congestion, or cough productive of sputum. Denies chest pain, palpitations, PND, orthopnea or leg swelling. Denies abdominal pain, nausea, vomitting, diarrhea or constipation. Denies change in bowel movements or bloody stool. Denies dysuria , frequency, incontinence or hesitancy. Denies  joint pain, swelling, or reduced mobility.  Denies depression, anxiety or insomnia. Denies  rash, lesions, or itch. she is  not exercising regularly , and has not really changed her diet to facilitat weight loss.     Current Medications (verified): 1)  Benadryl 25 Mg  Tabs (Diphenhydramine Hcl) .... Take 1 Qhs 2)  Maxzide-25 37.5-25 Mg Tabs (Triamterene-Hctz) .... Take 1 Tablet By Mouth Once A Day 3)  Ibuprofen 800 Mg Tabs (Ibuprofen) .... Take 1 Tablet By Mouth Two Times A Day As Needed Maximum Twice Weekly For Severe Headache1  Allergies (verified): 1)  ! Parlodel (Bromocriptine Mesylate)  Review of Systems      See HPI General:  Complains of sleep disorder; unable to stay asleep progtressively worsening, multiple awakenings , on avg 4 to 5 times . ENT:  Complains of hoarseness, nasal congestion, postnasal drainage, and sinus pressure; nasal congestion and sinus pressure conc stant, temp sensitive. MS:  Complains of joint pain; popping in left knee when climbing stairs . Neuro:  Complains of headaches; denies seizures and sensation of room spinning; uncontrolled headaches in different areas of the head  and neck. Endo:  Denies cold intolerance, excessive hunger, excessive thirst, excessive urination, heat intolerance, polyuria, and weight change. Heme:  Denies abnormal bruising and bleeding. Allergy:  Complains of seasonal allergies; uncontrolled, chronic head pressure, nasal cogestion .  Physical Exam  General:  Well-developed,obese,in no acute distress; alert,appropriate and cooperative throughout examination Head:  Normocephalic and atraumatic without obvious abnormalities. No apparent  alopecia or balding. Eyes:  vision grossly intact, pupils equal, pupils round, and pupils reactive to light.   Ears:  External ear exam shows no significant lesions or deformities.  Otoscopic examination reveals clear canals, tympanic membranes are intact bilaterally without bulging, retraction, inflammation or discharge. Hearing is grossly normal bilaterally. Nose:  External nasal examination shows no deformity or  inflammation. Nasal mucosa erythematous andedmatous. Mouth:  Oral mucosa and oropharynx without lesions or exudates.  Teeth in good repair. Neck:  No deformities, masses, or tenderness noted. Chest Wall:  No deformities, masses, or tenderness noted. Breasts:  No mass, nodules, thickening, tenderness, bulging, retraction, inflamation, nipple discharge or skin changes noted.   Lungs:  Normal respiratory effort, chest expands symmetrically. Lungs are clear to auscultation, no crackles or wheezes. Heart:  Normal rate and regular rhythm. S1 and S2 normal without gallop, murmur, click, rub or other extra sounds. Abdomen:  Bowel sounds positive,abdomen soft and non-tender without masses, organomegaly or hernias noted. Genitalia:  Normal introitus for age, no external lesions, no vaginal discharge, mucosa pink and moist, no vaginal or cervical lesions, no vaginal atrophy, no friaility or hemorrhage, normal uterus size and position, no adnexal masses or tenderness Msk:  No deformity or scoliosis noted of thoracic or lumbar spine.   Pulses:  R and L carotid,radial,femoral,dorsalis pedis and posterior tibial pulses are full and equal bilaterally Extremities:  No clubbing, cyanosis, edema, or deformity noted with normal full range of motion of all joints.   Neurologic:  No cranial nerve deficits noted. Station and gait are normal. Plantar reflexes are down-going bilaterally. DTRs are symmetrical throughout. Sensory, motor and coordinative functions appear intact. Skin:  Intact without suspicious lesions or rashes Cervical Nodes:  No lymphadenopathy noted Axillary Nodes:  No palpable lymphadenopathy Inguinal Nodes:  No significant adenopathy Psych:  Cognition and judgment appear intact. Alert and cooperative with normal attention span and concentration. No apparent delusions, illusions, hallucinations   Impression & Recommendations:  Problem # 1:  PHYSICAL EXAMINATION (ICD-V70.0) Assessment Comment  Only pap sent. Safe sex counselling done. Impt of regular exercise and weight loss through reduced caloric intake stressed.  Problem # 2:  HEADACHE (ICD-784.0) Assessment: Deteriorated  Her updated medication list for this problem includes:    Ibuprofen 800 Mg Tabs (Ibuprofen) .Marland Kitchen... Take 1 tablet by mouth two times a day as needed maximum twice weekly for severe headache1 pt to call neurologist's office about appt which was requested since last Fall  Problem # 3:  HYPERTENSION, ESSENTIAL (ICD-401.9) Assessment: Unchanged  Her updated medication list for this problem includes:    Maxzide-25 37.5-25 Mg Tabs (Triamterene-hctz) .Marland Kitchen... Take 1 tablet by mouth once a day  Orders: T-Basic Metabolic Panel [id number])  BP today: 114/84 Prior BP: 120/80 (07/26/2009)  Labs Reviewed: K+: 3.9 (05/03/2008) Creat: : 0.68 (05/03/2008)   Chol: 177 (05/03/2008)   HDL: 37 (05/03/2008)   LDL: 121 (05/03/2008)   TG: 94 (05/03/2008)  Problem # 4:  OBESITY (ICD-278.00) Assessment: Unchanged  Ht: 64.5 (10/25/2009)   Wt: 207.50 (10/25/2009)   BMI: 35.19 (10/25/2009)  Problem # 5:  ALLERGIC RHINITIS (ICD-477.9) Assessment: Deteriorated  The following medications were removed from the medication list:    Benadryl 25 Mg Tabs (Diphenhydramine hcl) .Marland Kitchen... Take 1 qhs Her updated medication list for this problem includes:    Astepro 0.15 % Soln (Azelastine hcl) .Marland Kitchen... 2 puffs per nostril twice daily  Complete Medication List: 1)  Maxzide-25 37.5-25 Mg Tabs (Triamterene-hctz) .... Take 1 tablet by mouth  once a day 2)  Ibuprofen 800 Mg Tabs (Ibuprofen) .... Take 1 tablet by mouth two times a day as needed maximum twice weekly for severe headache1 3)  Restoril 30 Mg Caps (Temazepam) .... Take 1 capsule by mouth at bedtime 4)  Singulair 10 Mg Tabs (Montelukast sodium) .... Take 1 tablet by mouth once a day 5)  Astepro 0.15 % Soln (Azelastine hcl) .... 2 puffs per nostril twice daily  Other Orders: T-Lipid  Profile ([id number]) T-CBC w/Diff ([id number]) T-TSH [id number])  Patient Instructions: 1)  Please schedule a follow-up appointment in 4 months. 2)  It is important that you exercise regularly at least 20 minutes 5 times a week. If you develop chest pain, have severe difficulty breathing, or feel very tired , stop exercising immediately and seek medical attention. 3)  You need to lose weight. Consider a lower calorie diet and regular exercise.  4)  BMP prior to visit, ICD-9: 5)  Lipid Panel prior to visit, ICD-9:  fasting asap 6)  TSH prior to visit, ICD-9: 7)  CBC w/ Diff prior to visit, ICD-9: 8)  please use your new meds for allergies every day. 9)  pls call the neurologist for appt re your headaches. 10)  New med for sleep Prescriptions: ASTEPRO 0.15 % SOLN (AZELASTINE HCL) 2 puffs per nostril twice daily  #1 x 3   Entered and Authorized by:   Syliva Overman MD   Signed by:   Syliva Overman MD on 10/25/2009   Method used:   [facility]        [facility] [address] [phone]* (retail)       [address]       [place], [place]  [address]       Ph: [phone] or [phone]       Fax: [phone]   RxID:   [id number] SINGULAIR 10 MG TABS (MONTELUKAST SODIUM) Take 1 tablet by mouth once a day  #30 x 3   Entered and Authorized by:   Syliva Overman MD   Signed by:   Syliva Overman MD on 10/25/2009   Method used:   [facility]        [facility] [address] [phone]* (retail)       [address]       [place], [place]  [address]       Ph: [phone] or [phone]       Fax: [phone]   RxID:   [id number] RESTORIL 30 MG CAPS (TEMAZEPAM) Take 1 capsule by mouth at bedtime  #30 x 5   Entered and Authorized by:   Syliva Overman MD   Signed by:   Syliva Overman MD on 10/25/2009   Method used:   Printed then [facility]ed [facility] ...       [facility]  [address] [phone]* (retail)       [address]       [place], [place]   [address]       Ph: [phone] or [phone]       Fax: [phone]   RxID:   [id number]   Prevention & Chronic Care Immunizations   Influenza vaccine: Not documented    Tetanus booster: 10/20/1997: Td    Pneumococcal vaccine: Not documented  Other Screening   Pap smear: normal  (05/10/2008)   Pap smear due: 05/2009   Smoking status: never  (  02/17/2008)  Hypertension   Last Blood Pressure: 114 / 84  (10/25/2009)   Serum creatinine: 0.68  (05/03/2008)   Serum potassium 3.9  (05/03/2008)  Self-Management Support :    Hypertension self-management support: Not documented

## 2010-11-21 NOTE — Assessment & Plan Note (Signed)
Summary: ov   Vital Signs:  Patient profile:   30 year old female Menstrual status:  irregular Height:      64.5 inches Weight:      218.25 pounds BMI:     37.02 O2 Sat:      98 % on Room air Pulse rate:   118 / minute Pulse rhythm:   regular Resp:     16 per minute BP sitting:   120 / 80  (left arm)  Vitals Entered By: Adella Hare LPN (October 16, 2010 1:39 PM)  Nutrition Counseling: Patient's BMI is greater than 25 and therefore counseled on weight management options.  O2 Flow:  Room air CC: follow-up visit Is Patient Diabetic? No   Primary Care Provider:  Syliva Overman MD  CC:  follow-up visit.  History of Present Illness: Reports  that she has not beendoing well as far as consitence in lifestyle change is concerned, her weight has increased. She continues to have headaches daily, denies current galactorreah, but has not ben following up with the neurologist, as she should Denies recent fever or chills. Denies sinus pressure, nasal congestion , ear pain or sore throat. Denies chest congestion, or cough productive of sputum. Denies chest pain, palpitations, PND, orthopnea or leg swelling. Denies abdominal pain, nausea, vomitting, diarrhea or constipation. Denies change in bowel movements or bloody stool. Denies dysuria , frequency, incontinence or hesitancy. Denies  joint pain, swelling, or reduced mDenies headaches, vertigo, seizures. Denies depression, anxiety or insomnia. Denies  rash, lesions, or itch.     Current Medications (verified): 1)  Maxzide-25 37.5-25 Mg Tabs (Triamterene-Hctz) .... Take 1 Tablet By Mouth Once A Day 2)  Ibuprofen 800 Mg Tabs (Ibuprofen) .... Take 1 Tablet By Mouth Two Times A Day As Needed Maximum Twice Weekly For Severe Headache1  Allergies (verified): 1)  ! 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 Eyes:  Denies blurring, discharge, eye pain, and red eye. Endo:  Denies cold intolerance, excessive hunger, excessive thirst, and heat intolerance. Heme:  Denies abnormal bruising and bleeding. Allergy:  Complains of seasonal allergies.  Physical Exam  General:  Well-developed,obese,in no acute distress; alert,appropriate and cooperative throughout examination HEENT: No facial asymmetry,  EOMI, No sinus tenderness, TM's Clear, oropharynx  pink and moist.   Chest: Clear to auscultation bilaterally.  CVS: S1, S2, No murmurs, No S3.   Abd: Soft, Nontender.  MS: Adequate ROM spine, hips, shoulders and knees.  Ext: No edema.   CNS: CN 2-12 intact, power tone and sensation normal throughout.   Skin: Intact, no visible lesions or rashes.  Psych: Good eye contact, normal affect.  Memory intact, not anxious or depressed appearing.    Impression & Recommendations:  Problem # 1:  HEADACHE (ICD-784.0) Assessment Unchanged  Her updated medication list for this problem includes:    Ibuprofen 800 Mg Tabs (Ibuprofen) .Marland Kitchen... Take 1 tablet by mouth two times a day as needed maximum twice weekly for severe headache1 pt advised to be re-evaluated by neurology  Problem # 2:  OBESITY (ICD-278.00) Assessment: Deteriorated  Ht: 64.5 (10/16/2010)   Wt: 218.25 (10/16/2010)   BMI: 37.02 (10/16/2010) therapeutic  lifestyle change discussed and encouraged pt to start phentermine half daily, this doescurb her apetitiew and she can tolerate the med  Problem # 3:  HYPERPROLACTINEMIA (ICD-253.1) Assessment: Comment Only  Orders: T-Prolactin ([id number])pt to return to endocrinologist/neurologist for f/u, she will call  Problem  # 4:  HYPERTENSION, ESSENTIAL (ICD-401.9) Assessment: Unchanged  Her updated medication list for this problem includes:    Maxzide-25 37.5-25 Mg Tabs (Triamterene-hctz) .Marland Kitchen... Take 1 tablet by mouth once a day  Orders: T-Basic Metabolic Panel [id number])  BP today: 120/80 Prior BP: 118/72 (11/13/2009)  Labs Reviewed: K+: 3.9 (05/03/2008) Creat: : 0.68 (05/03/2008)   Chol: 177 (05/03/2008)   HDL: 37 (05/03/2008)   LDL: 121 (05/03/2008)   TG: 94 (05/03/2008)  Complete Medication List: 1)  Maxzide-25 37.5-25 Mg Tabs (Triamterene-hctz) .... Take 1 tablet by mouth once a day 2)  Ibuprofen 800 Mg Tabs (Ibuprofen) .... Take 1 tablet by mouth two times a day as needed maximum twice weekly for severe headache1 3)  Phentermine Hcl 37.5 Mg Tabs (Phentermine hcl) .... Take 1 tablet by mouth once a day  Other Orders: T-Lipid Profile ([id number]) T-CBC w/Diff [phone]) T- Hemoglobin A1C ([id number]) T-TSH ([id number])  Patient Instructions: 1)  cPE in 2 months. 2)  BMP prior to visit, ICD-9: 3)  Lipid Panel prior to visit, ICD-9: 4)  TSH prior to visit, ICD-9:  fasting in 2 months 5)  CBC w/ Diff prior to visit, ICD-9: 6)  HbgA1C prior to visit, 7)  prolactin level ICD-9: 8)  med as discusssed. 9)  pls call the neurologist for re-evaluation of headaches and the pituitary tumor Prescriptions: PHENTERMINE HCL 37.5 MG TABS (PHENTERMINE HCL) Take 1 tablet by mouth once a day  #30 x 0   Entered and Authorized by:   Syliva Overman MD   Signed by:   Syliva Overman MD on 10/16/2010   Method used:   Print[facility] ...       [facility]  [address] [phone]* (retail)       [address]       [place], [place]  [address]       Ph: [phone] or [phone]       Fax: [phone]   RxID:   [id number]    Orders Added: 1)  T-Basic Metabolic Panel [id number] 2)  T-Lipid Profile [[id number]] 3)  T-CBC w/Diff [[id number]] 4)  T- Hemoglobin A1C  [83036-23375] 5)  T-Prolactin [[id number]] 6)  T-TSH [02585-27782] 7)  Est. Patient Level IV [42353]

## 2010-11-21 NOTE — Progress Notes (Signed)
Summary: bp pills  Phone Note Call from Patient   Summary of Call: made a appt for 12.28.11 can she get her bp pills sent in  call back and leave message to let her know  Initial call taken by: Lind Guest,  October 02, 2010 8:49 AM  Follow-up for Phone Call        please ask patient who prescribed the pills, i do not have record of this Follow-up by: Adella Hare LPN,  October 02, 2010 4:00 PM  Additional Follow-up for Phone Call Additional follow up Details #1::        Dr. Lodema Hong prescribed them to her.  The name of them are Triam/hctz 37.5/25 tablet. Additional Follow-up by: Curtis Sites,  October 04, 2010 10:10 AM    Prescriptions: MAXZIDE-25 37.5-25 MG TABS (TRIAMTERENE-HCTZ) Take 1 tablet by mouth once a day  #30 Tablet x 0   Entered by:   Adella Hare LPN   Authorized by:   Syliva Overman MD   Signed by:   Adella Hare LPN on 16/07/9603   Method used:   Electronically to        [facility] [phone]* (retail)       [address]       [place], [place]  [address]       Ph: [phone] or [phone]       Fax: [phone]   RxID:   [id number]

## 2010-11-21 NOTE — Progress Notes (Signed)
Summary: triamterene  Phone Note Call from Patient   Summary of Call: [facility] [place] [facility] please I told her from now on that she would need to have her pharmacy fax over refill request. Initial call taken by: Curtis Sites,  September 27, 2010 4:51 PM  Follow-up for Phone Call        patient needs ov before refill Follow-up by: Adella Hare LPN,  September 28, 2010 3:22 PM  Additional Follow-up for Phone Call Additional follow up Details #1::        left msg for patient to return my call so I can schedule her an appt. Additional Follow-up by: Curtis Sites,  September 30, 2010 3:54 PM    Additional Follow-up for Phone Call Additional follow up Details #2::    appt. 121.28.11 Follow-up by: Lind Guest,  October 02, 2010 8:40 AM

## 2010-12-18 ENCOUNTER — Encounter: Payer: Self-pay | Admitting: Family Medicine

## 2010-12-18 ENCOUNTER — Encounter (INDEPENDENT_AMBULATORY_CARE_PROVIDER_SITE_OTHER): Payer: 59 | Admitting: Family Medicine

## 2010-12-18 DIAGNOSIS — B379 Candidiasis, unspecified: Secondary | ICD-10-CM

## 2010-12-18 DIAGNOSIS — E669 Obesity, unspecified: Secondary | ICD-10-CM

## 2010-12-23 ENCOUNTER — Other Ambulatory Visit: Payer: Self-pay | Admitting: Family Medicine

## 2010-12-23 DIAGNOSIS — R519 Headache, unspecified: Secondary | ICD-10-CM

## 2010-12-23 DIAGNOSIS — M542 Cervicalgia: Secondary | ICD-10-CM

## 2010-12-27 ENCOUNTER — Ambulatory Visit (HOSPITAL_COMMUNITY)
Admission: RE | Admit: 2010-12-27 | Discharge: 2010-12-27 | Disposition: A | Discharge status: Home / Self Care | Payer: 59 | Source: Ambulatory Visit | Attending: Family Medicine | Admitting: Family Medicine

## 2010-12-27 ENCOUNTER — Telehealth: Payer: Self-pay | Admitting: Family Medicine

## 2010-12-27 DIAGNOSIS — R51 Headache: Secondary | ICD-10-CM | POA: Insufficient documentation

## 2010-12-27 DIAGNOSIS — E236 Other disorders of pituitary gland: Secondary | ICD-10-CM | POA: Insufficient documentation

## 2010-12-27 DIAGNOSIS — M542 Cervicalgia: Secondary | ICD-10-CM

## 2010-12-27 DIAGNOSIS — R519 Headache, unspecified: Secondary | ICD-10-CM

## 2010-12-27 LAB — CREATININE, SERUM
Creatinine, Ser: 0.64 mg/dL (ref 0.4–1.2)
GFR calc Af Amer: >60 mL/min (ref >60)
GFR calc non Af Amer: >60 mL/min (ref >60)

## 2010-12-27 MED ORDER — GADOBENATE DIMEGLUMINE 529 MG/ML IV SOLN: 10.0000 mL | Freq: Once | INTRAVENOUS | Status: DC | PRN

## 2010-12-31 NOTE — Progress Notes (Signed)
Summary: creatine [facility]  Phone Note Call from Patient   Summary of Call: need a stat creatine sent to [facility]. pt there now! Initial call taken by: Rudene Anda,  December 27, 2010 10:06 AM  Follow-up for Phone Call        order sent Follow-up by: Adella Hare LPN,  December 27, 2010 10:27 AM

## 2010-12-31 NOTE — Assessment & Plan Note (Signed)
Summary: cpe 2 mths   Vital Signs:  Patient profile:   30 year old female Menstrual status:  irregular Height:      64.5 inches Weight:      221 pounds BMI:     37.48 O2 Sat:      96 % Pulse rate:   83 / minute Pulse rhythm:   regular Resp:     16 per minute BP sitting:   130 / 98  (left arm) Cuff size:   large  Vitals Entered By: Everitt Amber LPN (December 18, 2010 3:43 PM)  Nutrition Counseling: Patient's BMI is greater than 25 and therefore counseled on weight management options. CC: Follow up, Wants to reschedule CPE    Primary Care Provider:  Syliva Overman MD  CC:  Follow up and Wants to reschedule CPE .  History of Present Illness: Skin complaint, intermittent puritic rash under the breasts, worse in the warm months, no current flare  States ever since throat surgery, x 2 she has had progressive difficulty with vomitting as though she cannot breathe, also states she has pain at the nape of her head when she coughs. 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 Reports neck pain to both shoulders daily    Denies recent fever or chills. Denies sinus pressure, nasal congestion , ear pain or sore throat. Denies chest congestion, or cough productive of sputum. Denies chest pain, palpitations, PND, orthopnea or leg swelling. Denies abdominal pain, nausea, vomitting, diarrhea or constipation. Denies change in bowel movements or bloody stool. Denies dysuria , frequency, incontinence or hesitancy. Denies  joint pain, swelling, or reduced mobility. Denies headaches, vertigo, seizures. Denies depression, anxiety or insomnia.    Current Medications (verified): 1)  Maxzide-25 37.5-25 Mg Tabs (Triamterene-Hctz) .... Take 1 Tablet By Mouth Once A Day 2)  Ibuprofen 800 Mg Tabs (Ibuprofen) .... Take 1 Tablet By Mouth Two Times A Day As Needed Maximum Twice Weekly For Severe Headache1 3)  Phentermine Hcl 37.5 Mg Tabs (Phentermine  Hcl) .... Take 1 Tablet By Mouth Once A Day  Allergies (verified): 1)  ! Parlodel (Bromocriptine Mesylate)  Past History:  Past Surgical History: Caesarean section in 1996 Tonsillectomy  1999, pt reports she had to have corrective surgery following the tonsillectomy by a second surgeon  Review of Systems      See HPI Eyes:  Denies blurring and discharge. Derm:  Complains of rash; puritic malodoros rash under breastsintermittently x 3 weeks, with redness, pt has used neosporin . Neuro:  Complains of headaches. Endo:  Denies cold intolerance, excessive hunger, excessive thirst, and excessive urination. Heme:  Denies abnormal bruising, bleeding, enlarge lymph nodes, and fevers. Allergy:  Complains of seasonal allergies; denies hives or rash and itching eyes.  Physical Exam  General:  Well-developed,obese,in no acute distress; alert,appropriate and cooperative throughout examination HEENT: No facial asymmetry,  EOMI, No sinus tenderness, TM's Clear, oropharynx  pink and moist. decreased ROM neck  Chest: Clear to auscultation bilaterally.  CVS: S1, S2, No murmurs, No S3.   Abd: Soft, Nontender.  MS: Adequate ROM spine, hips, shoulders and knees.  Ext: No edema.   CNS: CN 2-12 intact, power tone and sensation normal throughout.   Skin: Intact, no visible lesions or rashes. h/o fungal /yeast rash under breasts intermittently Psych: Good eye contact, normal affect.  Memory intact, not anxious or depressed appearing.    Impression & Recommendations:  Problem #  1:  NECK PAIN (ICD-723.1) Assessment Deteriorated  Her updated medication list for this problem includes:    Ibuprofen 800 Mg Tabs (Ibuprofen) .Marland Kitchen... Take 1 tablet by mouth two times a day as needed maximum twice weekly for severe headache1  Future Orders: Radiology Referral (Radiology) ... 12/23/2010  Problem # 2:  HEADACHE (ICD-784.0) Assessment: Deteriorated  Her updated medication list for this problem includes:     Ibuprofen 800 Mg Tabs (Ibuprofen) .Marland Kitchen... Take 1 tablet by mouth two times a day as needed maximum twice weekly for severe headache1  Future Orders: Radiology Referral (Radiology) ... 12/23/2010  Problem # 3:  CANDIDIASIS (ICD-112.9) Assessment: Comment Only nystatin prescribed for as needed use  Problem # 4:  OBESITY (ICD-278.00) Assessment: Unchanged  Ht: 64.5 (12/18/2010)   Wt: 221 (12/18/2010)   BMI: 37.48 (12/18/2010) therapeutic lifestyle change discussed and encouraged  Complete Medication List: 1)  Maxzide-25 37.5-25 Mg Tabs (Triamterene-hctz) .... Take 1 tablet by mouth once a day 2)  Ibuprofen 800 Mg Tabs (Ibuprofen) .... Take 1 tablet by mouth two times a day as needed maximum twice weekly for severe headache1 3)  Phentermine Hcl 37.5 Mg Tabs (Phentermine hcl) .... Take 1 tablet by mouth once a day 4)  Nystop 100000 Unit/gm Powd (Nystatin) .... Apply twice daily for 5 days to rash under breast when needed  Patient Instructions: 1)  cPE in 2 months. 2)  you are being referred for a brain and neck scan. 3)  Fasting labs asap 4)  Nystatin powder is sent in for use for 5 days when the rash flares up, otherwise pls use a cornstarch based powder, and keep the area as dry as possible Prescriptions: MAXZIDE-25 37.5-25 MG TABS (TRIAMTERENE-HCTZ) Take 1 tablet by mouth once a day  #30 Tablet x 3   Entered by:   Adella Hare LPN   Authorized by:   Syliva Overman MD   Signed by:   Adella Hare LPN on 10/22/7251   Method used:   [facility]        [facility] [phone]* (retail)       [address]       [place], [place]  [address]       Ph: [phone]       Fax: [phone]   RxID:   [id number] NYSTOP 100000 UNIT/GM POWD (NYSTATIN) apply twice daily for 5 days to rash under breast when needed  #60gm1 x 1   Entered and Authorized by:   Syliva Overman MD   Signed by:   Syliva Overman MD on 12/18/2010   Method used:   [facility] to         [facility] [phone]* (retail)       [address]       [place], [place]  [address]       Ph: [phone]       Fax: [phone]   RxID:   [id number]    Orders Added: 1)  Est. Patient Level IV [03500] 2)  Radiology Referral [Radiology] 3)  Radiology Referral [Radiology]

## 2011-01-03 ENCOUNTER — Telehealth: Payer: Self-pay | Admitting: Family Medicine

## 2011-01-06 ENCOUNTER — Telehealth: Payer: Self-pay | Admitting: Family Medicine

## 2011-01-16 NOTE — Progress Notes (Signed)
Summary: returning call  Phone Note Call from Patient   Summary of Call: pt returning call [phone] Initial call taken by: Rudene Anda,  January 06, 2011 4:34 PM  Follow-up for Phone Call        see previous msg Follow-up by: Everitt Amber LPN,  January 06, 2011 4:43 PM

## 2011-01-16 NOTE — Progress Notes (Signed)
Summary: please advise  Phone Note Call from Patient   Summary of Call: Patient called in wanted to talk to the nurse, about discussing with Dr. Lodema Hong something for pain she states she is hurting in her back.  [facility] [place] [facility] # is [phone] Initial call taken by: Curtis Sites,  January 03, 2011 2:06 PM  Follow-up for Phone Call        returned call, went straight to voicemail Follow-up by: Adella Hare LPN,  January 03, 2011 2:28 PM  Additional Follow-up for Phone Call Additional follow up Details #1::        please notify the patient and the pharmacy of the medication . The new script is entered historically, please send after speaking with the patient.  Additional Follow-up by: Syliva Overman MD,  January 04, 2011 11:34 PM    Additional Follow-up for Phone Call Additional follow up Details #2::    called patient left message  Follow-up by: Everitt Amber LPN,  January 06, 2011 12:58 PM  Additional Follow-up for Phone Call Additional follow up Details #3:: Details for Additional Follow-up Action Taken: called patient no answer Additional Follow-up by: Everitt Amber LPN,  January 06, 2011 3:22 PM  New/Updated Medications: TRAMADOL HCL 50 MG TABS (TRAMADOL HCL) Take 1 tablet by mouth two times a day as needed for uncontrolled pain Prescriptions: TRAMADOL HCL 50 MG TABS (TRAMADOL HCL) Take 1 tablet by mouth two times a day as needed for uncontrolled pain  #40 x 0   Entered by:   Everitt Amber LPN   Authorized by:   Syliva Overman MD   Signed by:   Everitt Amber LPN on 45/40/9811   Method used:   Electronically to        [facility] [phone]* (retail)       [address]       [place], [place]  [address]       Ph: [phone]       Fax: [phone]   RxID:   [id number] TRAMADOL HCL 50 MG TABS (TRAMADOL HCL) Take 1 tablet by mouth two times a day as needed for uncontrolled pain  #40 x 0   Entered and Authorized by:   Syliva Overman MD  Signed by:   Syliva Overman MD on 01/04/2011   Method used:   Historical   RxID:   [id number]

## 2011-02-12 ENCOUNTER — Encounter: Payer: Self-pay | Admitting: Family Medicine

## 2011-02-17 ENCOUNTER — Encounter: Payer: Self-pay | Admitting: Family Medicine

## 2011-03-07 NOTE — Op Note (Signed)
[place]  Patient:    Kaylee Montgomery, Kaylee Montgomery                  MRN: [id number] Proc. Date: 04/28/00 Attending:  Fernande Boyden                           Operative Report  PREOPERATIVE DIAGNOSIS:  Left laryngeal intubation granuloma.  POSTOPERATIVE DIAGNOSIS:  Left laryngeal internal laryngocele.  PROCEDURE PERFORMED:  Microdirect laryngoscopy and biopsy and then laser excision, left laryngocele ______  ______ .  BLOOD LOSS:  Minimal.  COMPLICATIONS:  None.   INCOMPLETE DICTATION. DD:  04/29/99 TD:  04/28/00 Job: [id number]

## 2011-03-07 NOTE — Op Note (Signed)
. [facility]  Patient:    SHAHED, Kaylee Montgomery                  MRN: [id number] Proc. Date: 12/17/00 Adm. Date:  [id number] Attending:  Fernande Boyden CC:         Kerri Perches, MD, [place]   Operative Report  PREOPERATIVE DIAGNOSIS:  Left laryngeal false vocal cord granuloma, status post prior surgery.  POSTOPERATIVE DIAGNOSIS:  Left laryngeal false vocal cord granuloma, status post prior surgery.  OPERATION PERFORMED:  Microdirect laryngoscopy and laser excision, laryngeal granuloma.  SURGEON:  Gloris Manchester. Lazarus Salines, M.D.  ANESTHESIA:  General orotracheal.  ESTIMATED BLOOD LOSS:  Minimal.  COMPLICATIONS:  None.  FINDINGS:  A 4 to 5 mm pedunculated granuloma of the anterior left false vocal cord at the previous surgical site.  Otherwise, normal vocal cords.  DESCRIPTION OF PROCEDURE:  With the patient in a comfortable supine position, general orotracheal anesthesia was administered using a metal endotracheal tube.  At an appropriate level, the table was turned 90 degrees and the patient was placed in a slightly reversed Trendelenburg.  Saline saturated eye pads were placed over minimal taping.  Head drape was used to secure these in position.  A rubber tooth guard was placed.  Taking care to protect lips, teeth and endotracheal tube, the Dedo laryngoscope was introduced, passed into the endolarynx.  The lesion was visualized.  The laryncoscope was suspended in the standard fashion.  The patients entire face including the laryngoscope were draped out in saline saturated towels.  The laser had been previously tested for aim and function.  All personnel in the room had protective eye gear.  A saline moistened pledget was placed in the subglottis to protect the endotracheal tube cuff.  4% cocaine moistened cottonoid was placed against the lesion for intraoperative hemostasis for several minutes.  This was removed. Photographs  were taken.  Using the laser on a 10 watt 0.05 second pulse mode, the lesion was grasped and retracted medially and its base was amputated using the laser.  The lesion was delivered piecemeal and sent for specimen.  The remaining nubbins of lesion were coagulated using the laser.  Hemostasis was observed.  A cocaine cottonoid was removed and photos were taken once again. At this point the procedure was completed.  The laryngoscope was unsuspended and removed.  The tooth guard was removed.  The dental status was intact.  The patient was returned to anesthesia, awakened, extubated and transferred to recovery in stable condition.  COMMENT:  [name] with a finding eight months ago of an internal laryngocele of the left false vocal cord removed/marsupialized with laser.  In the last month or two, she has developed increased hoarseness and on examination had a pedunculated granuloma, hence the indication for todays procedure.  Anticipate a routine postoperative recovery with attention to analgesia, voice rest and use of proton pump inhibitors to prevent any aggravation from reflux.  Given low anticipated risk of postanesthetic or postsurgical complications, I feel an outpatient venue is appropriate. DD:  12/17/00 TD:  12/17/00 Job: [id number]

## 2011-03-15 ENCOUNTER — Other Ambulatory Visit: Payer: Self-pay | Admitting: Family Medicine

## 2011-03-15 LAB — LIPID PANEL
Cholesterol: 187 mg/dL (ref 0–200)
HDL: 38 mg/dL — ABNORMAL LOW (ref >39)
LDL Cholesterol: 136 mg/dL — ABNORMAL HIGH (ref 0–99)
Total CHOL/HDL Ratio: 4.9 Ratio
Triglycerides: 65 mg/dL (ref <150)
VLDL: 13 mg/dL (ref 0–40)

## 2011-03-15 LAB — BASIC METABOLIC PANEL
BUN: 7 mg/dL (ref 6–23)
CO2: 21 mEq/L (ref 19–32)
Calcium: 9.5 mg/dL (ref 8.4–10.5)
Chloride: 104 mEq/L (ref 96–112)
Creat: 0.59 mg/dL (ref 0.40–1.20)
Glucose, Bld: 93 mg/dL (ref 70–99)
Potassium: 3.8 mEq/L (ref 3.5–5.3)
Sodium: 138 mEq/L (ref 135–145)

## 2011-03-15 LAB — CBC WITH DIFFERENTIAL/PLATELET
Basophils Absolute: 0 10*3/uL (ref 0.0–0.1)
Basophils Relative: 1 % (ref 0–1)
Eosinophils Absolute: 0.1 10*3/uL (ref 0.0–0.7)
Eosinophils Relative: 1 % (ref 0–5)
HCT: 37.5 % (ref 36.0–46.0)
Hemoglobin: 12.3 g/dL (ref 12.0–15.0)
Lymphocytes Relative: 45 % (ref 12–46)
Lymphs Abs: 2.2 10*3/uL (ref 0.7–4.0)
MCH: 25.6 pg — ABNORMAL LOW (ref 26.0–34.0)
MCHC: 32.8 g/dL (ref 30.0–36.0)
MCV: 78.1 fL (ref 78.0–100.0)
Monocytes Absolute: 0.3 10*3/uL (ref 0.1–1.0)
Monocytes Relative: 6 % (ref 3–12)
Neutro Abs: 2.3 10*3/uL (ref 1.7–7.7)
Neutrophils Relative %: 47 % (ref 43–77)
Platelets: 412 10*3/uL — ABNORMAL HIGH (ref 150–400)
RBC: 4.8 MIL/uL (ref 3.87–5.11)
RDW: 13.1 % (ref 11.5–15.5)
WBC: 4.9 10*3/uL (ref 4.0–10.5)

## 2011-03-15 LAB — TSH: TSH: 1.52 u[IU]/mL (ref 0.350–4.500)

## 2011-03-15 LAB — PROLACTIN: Prolactin: 99.3 ng/mL

## 2011-03-15 LAB — HEMOGLOBIN A1C
Hgb A1c MFr Bld: 5.9 % — ABNORMAL HIGH (ref <5.7)
Mean Plasma Glucose: 123 mg/dL — ABNORMAL HIGH (ref <117)

## 2011-03-26 ENCOUNTER — Encounter: Payer: Self-pay | Admitting: Family Medicine

## 2011-03-27 ENCOUNTER — Encounter: Payer: Self-pay | Admitting: Family Medicine

## 2011-03-27 ENCOUNTER — Other Ambulatory Visit (HOSPITAL_COMMUNITY)
Admission: RE | Admit: 2011-03-27 | Discharge: 2011-03-27 | Disposition: A | Discharge status: Home / Self Care | Payer: 59 | Source: Ambulatory Visit | Attending: Family Medicine | Admitting: Family Medicine

## 2011-03-27 ENCOUNTER — Ambulatory Visit (INDEPENDENT_AMBULATORY_CARE_PROVIDER_SITE_OTHER): Payer: 59 | Admitting: Family Medicine

## 2011-03-27 VITALS — BP 110/80 | HR 65 | Resp 16 | Ht 65.0 in | Wt 217.1 lb

## 2011-03-27 DIAGNOSIS — Z0000 Encounter for general adult medical examination without abnormal findings: Secondary | ICD-10-CM

## 2011-03-27 DIAGNOSIS — Z124 Encounter for screening for malignant neoplasm of cervix: Secondary | ICD-10-CM

## 2011-03-27 DIAGNOSIS — N62 Hypertrophy of breast: Secondary | ICD-10-CM

## 2011-03-27 DIAGNOSIS — E669 Obesity, unspecified: Secondary | ICD-10-CM

## 2011-03-27 DIAGNOSIS — Z01419 Encounter for gynecological examination (general) (routine) without abnormal findings: Secondary | ICD-10-CM | POA: Insufficient documentation

## 2011-03-27 DIAGNOSIS — N760 Acute vaginitis: Secondary | ICD-10-CM

## 2011-03-27 DIAGNOSIS — I10 Essential (primary) hypertension: Secondary | ICD-10-CM

## 2011-03-27 DIAGNOSIS — J309 Allergic rhinitis, unspecified: Secondary | ICD-10-CM

## 2011-03-27 MED ORDER — METHYLPREDNISOLONE ACETATE 80 MG/ML IJ SUSP
80.0000 mg | Freq: Once | INTRAMUSCULAR | Status: AC
  Administered 2011-03-27: 80 mg via INTRAMUSCULAR

## 2011-03-27 MED ORDER — PREDNISONE (PAK) 5 MG PO TABS: 5.0000 mg | ORAL_TABLET | ORAL | Status: DC

## 2011-03-27 NOTE — Patient Instructions (Addendum)
F/U in 4 months  It is important that you exercise regularly at least 30 minutes 5 times a week. If you develop chest pain, have severe difficulty breathing, or feel very tired, stop exercising immediately and seek medical attention  A healthy diet is rich in fruit, vegetables and whole grains. Poultry fish, nuts and beans are a healthy choice for protein rather then red meat. A low sodium diet and drinking 64 ounces of water daily is generally recommended. Oils and sweet should be limited. Carbohydrates especially for those who are diabetic or overweight, should be limited to 34-45 gram per meal. It is important to eat on a regular schedule, at least 3 times daily. Snacks should be primarily fruits, vegetables or nuts.   HBA1C and chem7 in 4 months.   For  uncontrolled allergies,you will get injection in the office and meds a re sent to the pharmacy

## 2011-03-29 LAB — WET PREP BY MOLECULAR PROBE
Candida species: NEGATIVE
Gardnerella vaginalis: NEGATIVE
Trichomonas vaginosis: NEGATIVE

## 2011-03-29 LAB — GC/CHLAMYDIA PROBE AMP, GENITAL
Chlamydia, DNA Probe: NEGATIVE
GC Probe Amp, Genital: NEGATIVE

## 2011-04-01 ENCOUNTER — Other Ambulatory Visit: Payer: Self-pay

## 2011-04-01 ENCOUNTER — Telehealth: Payer: Self-pay | Admitting: Family Medicine

## 2011-04-01 ENCOUNTER — Encounter: Payer: Self-pay | Admitting: *Deleted

## 2011-04-01 DIAGNOSIS — J309 Allergic rhinitis, unspecified: Secondary | ICD-10-CM

## 2011-04-01 MED ORDER — PREDNISONE (PAK) 5 MG PO TABS: 5.0000 mg | ORAL_TABLET | ORAL | Status: AC

## 2011-04-01 NOTE — Telephone Encounter (Signed)
Resent in

## 2011-04-05 NOTE — Assessment & Plan Note (Signed)
Unchanged, has unsuccesfully attempted in the past to have reduction

## 2011-04-05 NOTE — Assessment & Plan Note (Signed)
Controlled, no change in medication  

## 2011-04-05 NOTE — Progress Notes (Signed)
  Subjective:    Patient ID: Kaylee Montgomery, female    DOB: [birth year], 30 y.o.   MRN: [id number]  HPI The PT is here for annual exam  and re-evaluation of chronic medical conditions, medication management and review of any  radiology data.  Preventive health is updated, specifically  Cancer screening, and Immunization.   Questions or concerns regarding consultations or procedures which the PT has had in the interim are  addressed. The PT denies any adverse reactions to current medications since the last visit.  C/o increased and uncontrolled allergies, nasal congestion, difficulty breathing, denies fever or chills or wheezing. Frustrated with inability to [employer]	[place] despite attempts    Review of Systems Denies recent fever or chills. c/o sinus pressure, nasal congestion,  And difficulty breathing. Denies chest congestion, productive cough or wheezing. Denies chest pains, palpitations, paroxysmal nocturnal dyspnea, orthopnea and leg swelling Denies abdominal pain, nausea, vomiting,diarrhea or constipation.  Denies rectal bleeding or change in bowel movement. Denies dysuria, frequency, hesitancy or incontinence. Denies joint pain, swelling and limitation in mobility. Denies headaches, seizure, numbness, or tingling. Denies depression, anxiety or insomnia. Denies skin break down or rash.       Objective:   Physical Exam Pleasant well nourished female, alert and oriented x 3, in no cardio-pulmonary distress. Afebrile. HEENT No facial trauma or asymetry.  No sinus tenderness. Erythema and edema of nasal mucosa EOMI, PERTL, fundoscopic exam is normal, no hemorhage or exudate. No papiledema External ears normal, tympanic membranes clear. Oropharynx moist, no exudate, good dentition. Neck: supple, no adenopathy,JVD or thyromegaly.No bruits.  Chest: Clear to ascultation bilaterally.No crackles or wheezes. Non tender to palpation  Breast: No asymetry,no masses. No nipple  discharge or inversion. No axillary or supraclavicular adenopathy  Cardiovascular system; Heart sounds normal,  S1 and  S2 ,no S3.  No murmur, or thrill. Apical beat not displaced Peripheral pulses normal.  Abdomen: Soft, non tender, no organomegaly or masses. No bruits. Bowel sounds normal. No guarding, tenderness or rebound.  GU: External genitalia normal. No lesions. Vaginal canal normal.No discharge. Uterus normal size, no adnexal masses, no cervical motion or adnexal tenderness.  Musculoskeletal exam: Full ROM of spine, hips , shoulders and knees. No deformity ,swelling or crepitus noted. No muscle wasting or atrophy.   Neurologic: Cranial nerves 2 to 12 intact. Power, tone ,sensation and reflexes normal throughout. No disturbance in gait. No tremor.  Skin: Intact, no ulceration, erythema , scaling or rash noted. Pigmentation normal throughout  Psych; Normal mood and affect. Judgement and concentration normal        Assessment & Plan:

## 2011-04-05 NOTE — Assessment & Plan Note (Signed)
Uncontrolled prednisone prescribed

## 2011-04-24 ENCOUNTER — Encounter: Payer: Self-pay | Admitting: Family Medicine

## 2011-04-24 ENCOUNTER — Encounter: Payer: Self-pay | Admitting: *Deleted

## 2011-04-24 ENCOUNTER — Ambulatory Visit (INDEPENDENT_AMBULATORY_CARE_PROVIDER_SITE_OTHER): Payer: 59 | Admitting: Family Medicine

## 2011-04-24 VITALS — BP 132/94 | HR 91 | Resp 16 | Ht 65.5 in | Wt 219.0 lb

## 2011-04-24 DIAGNOSIS — R51 Headache: Secondary | ICD-10-CM

## 2011-04-24 DIAGNOSIS — M542 Cervicalgia: Secondary | ICD-10-CM

## 2011-04-24 DIAGNOSIS — E669 Obesity, unspecified: Secondary | ICD-10-CM

## 2011-04-24 DIAGNOSIS — I10 Essential (primary) hypertension: Secondary | ICD-10-CM

## 2011-04-24 MED ORDER — TIZANIDINE HCL 4 MG PO TABS: ORAL_TABLET | ORAL | Status: DC

## 2011-04-24 MED ORDER — IBUPROFEN 800 MG PO TABS: 800.0000 mg | ORAL_TABLET | Freq: Three times a day (TID) | ORAL | Status: DC | PRN

## 2011-04-24 MED ORDER — METHYLPREDNISOLONE ACETATE 80 MG/ML IJ SUSP
80.0000 mg | Freq: Once | INTRAMUSCULAR | Status: AC
  Administered 2011-04-24: 80 mg via INTRAMUSCULAR

## 2011-04-24 MED ORDER — KETOROLAC TROMETHAMINE 60 MG/2ML IM SOLN
60.0000 mg | Freq: Once | INTRAMUSCULAR | Status: AC
  Administered 2011-04-24: 60 mg via INTRAMUSCULAR

## 2011-04-24 MED ORDER — HYDROCODONE-ACETAMINOPHEN 5-500 MG PO TABS: ORAL_TABLET | ORAL | Status: AC

## 2011-04-24 NOTE — Assessment & Plan Note (Signed)
Deteriorated, however no change in med, I believe  eelvated due to pain

## 2011-04-24 NOTE — Assessment & Plan Note (Signed)
Deteriorated , toradol and depomedrol in the office, and vicodin prescribed for as needed use, pt to use muscle relaxer at night

## 2011-04-24 NOTE — Assessment & Plan Note (Signed)
Deteriorated. Patient re-educated about  the importance of commitment to a  minimum of 150 minutes of exercise per week. The importance of healthy food choices with portion control discussed. Encouraged to start a food diary, count calories and to consider  joining a support group. Sample diet sheets offered. Goals set by the patient for the next several months.    

## 2011-04-24 NOTE — Patient Instructions (Signed)
You are being treated for headache, and will get injections in the office, toradol and depomedrol, als med is sent to your pharmacy.  There are no neurologic deficits on exam, however , if you have worsening of headache or repeat episode of wekaness or numbness you need to go the ED,   You are being referred to neurology.Impt tht you go.  Work excuse for 7/5 and 7/6 to return 04/26/2011.

## 2011-04-24 NOTE — Progress Notes (Signed)
  Subjective:    Patient ID: Kaylee Montgomery, female    DOB: 04-12-1981, 30 y.o.   MRN: [id number]  HPI Pt was awakened from her sleep approx 5 am yesterday with sharp pains, primarily ibvolvmng the right side of her head down right arm to the fingers, rated 10 plus at the time. Used hot compresses, took 800mg  ibuprofen around 12 md, at which time she had started feeling better, however by 5p she started feeling worse again, did not feel like herself, as though she would pass out, chills occured , but no documented fever. She denies any recent symptoms of infection, however , she just generally felt so badly, she wondered if she had been bitten by a spider, or a tick, she has examined her body and found no puncture sites.   Review of Systems Denies recent fever or chills. Denies sinus pressure, nasal congestion, ear pain or sore throat. Denies chest congestion, productive cough or wheezing. Denies chest pains, palpitations, paroxysmal nocturnal dyspnea, orthopnea and leg swelling Denies abdominal pain, nausea, vomiting,diarrhea or constipation.  . Denies dysuria, frequency, hesitancy or incontinence. C/o tenderness over right posterior shoulder , with slight limitation in movement of her neck to the sides Denies  seizure, numbness, or tingling. Denies depression, anxiety or insomnia. Denies skin break down or rash.        Objective:   Physical Exam Patient alert and oriented and in no Cardiopulmonary distress.Pt in pain  HEENT: No facial asymmetry, EOMI, no sinus tenderness, TM's clear, Oropharynx pink and moist.  Neck supple no adenopathy.Fundoscopy negative for hemorhage, vessels appear normal  Chest: Clear to auscultation bilaterally.  CVS: S1, S2 no murmurs, no S3.  ABD: Soft non tender. Bowel sounds normal.  Ext: No edema  MS: Adequate ROM spine, shoulders, hips and knees.  Skin: Intact, no ulcerations or rash noted.  Psych: Good eye contact, normal affect. Memory  intact not anxious or depressed appearing.  CNS: CN 2-12 intact, power, tone and sensation normal throughout.        Assessment & Plan:

## 2011-04-28 ENCOUNTER — Telehealth: Payer: Self-pay

## 2011-04-28 NOTE — Telephone Encounter (Signed)
Patient states she received shot in office last week in the right hip and its a slightly raised area and it itches. I asked if she'd ever received the shots before and she didn't think so. I told her the doc was out of the office this week so she could go to the urgent care or use some cream for the itching and I would let the doctor know

## 2011-05-09 NOTE — Telephone Encounter (Signed)
Message left to f/u on complaint, I had called earlier this week, also, no msg left at that time, no answer when call was made

## 2011-07-02 ENCOUNTER — Other Ambulatory Visit: Payer: Self-pay | Admitting: Family Medicine

## 2011-07-29 ENCOUNTER — Encounter: Payer: Self-pay | Admitting: Family Medicine

## 2011-08-23 ENCOUNTER — Other Ambulatory Visit: Payer: Self-pay | Admitting: Family Medicine

## 2011-09-08 ENCOUNTER — Encounter: Payer: Self-pay | Admitting: Family Medicine

## 2011-09-16 ENCOUNTER — Encounter: Payer: Self-pay | Admitting: Family Medicine

## 2011-09-16 ENCOUNTER — Ambulatory Visit: Payer: 59 | Admitting: Family Medicine

## 2011-11-06 ENCOUNTER — Ambulatory Visit (INDEPENDENT_AMBULATORY_CARE_PROVIDER_SITE_OTHER): Payer: 59 | Admitting: Family Medicine

## 2011-11-06 ENCOUNTER — Encounter: Payer: Self-pay | Admitting: Family Medicine

## 2011-11-06 VITALS — BP 112/84 | HR 111 | Temp 99.4°F | Resp 16 | Ht 65.5 in | Wt 223.4 lb

## 2011-11-06 DIAGNOSIS — I10 Essential (primary) hypertension: Secondary | ICD-10-CM

## 2011-11-06 DIAGNOSIS — J209 Acute bronchitis, unspecified: Secondary | ICD-10-CM | POA: Insufficient documentation

## 2011-11-06 DIAGNOSIS — E669 Obesity, unspecified: Secondary | ICD-10-CM

## 2011-11-06 DIAGNOSIS — J329 Chronic sinusitis, unspecified: Secondary | ICD-10-CM

## 2011-11-06 DIAGNOSIS — R7301 Impaired fasting glucose: Secondary | ICD-10-CM

## 2011-11-06 DIAGNOSIS — J40 Bronchitis, not specified as acute or chronic: Secondary | ICD-10-CM

## 2011-11-06 MED ORDER — BENZONATATE 100 MG PO CAPS: 100.0000 mg | ORAL_CAPSULE | Freq: Four times a day (QID) | ORAL | Status: DC | PRN

## 2011-11-06 MED ORDER — PENICILLIN V POTASSIUM 500 MG PO TABS: 500.0000 mg | ORAL_TABLET | Freq: Three times a day (TID) | ORAL | Status: AC

## 2011-11-06 MED ORDER — PREDNISONE (PAK) 5 MG PO TABS: 5.0000 mg | ORAL_TABLET | ORAL | Status: DC

## 2011-11-06 MED ORDER — FLUCONAZOLE 150 MG PO TABS
ORAL_TABLET | ORAL | Status: AC
Start: 2011-11-06 — End: 2011-11-07

## 2011-11-06 MED ORDER — PROMETHAZINE-DM 6.25-15 MG/5ML PO SYRP
ORAL_SOLUTION | ORAL | Status: AC
Start: 2011-11-06 — End: 2011-11-13

## 2011-11-06 NOTE — Patient Instructions (Signed)
cPE June 8 or after.  You are being treated for sinusitis, and bronchitis, medication is sent to your pharmacy  HBA1C  Today.or as soon as possible  Please use "my fitness pal to assist with weight loss Work excuse to return 01/21 from 11/03/2011  It is important that you exercise regularly at least 30 minutes 5 times a week. If you develop chest pain, have severe difficulty breathing, or feel very tired, stop exercising immediately and seek medical attention  A healthy diet is rich in fruit, vegetables and whole grains. Poultry fish, nuts and beans are a healthy choice for protein rather then red meat. A low sodium diet and drinking 64 ounces of water daily is generally recommended. Oils and sweet should be limited. Carbohydrates especially for those who are diabetic or overweight, should be limited to 34-45 gram per meal. It is important to eat on a regular schedule, at least 3 times daily. Snacks should be primarily fruits, vegetables or nuts.

## 2011-11-07 ENCOUNTER — Other Ambulatory Visit: Payer: Self-pay | Admitting: Family Medicine

## 2011-11-07 ENCOUNTER — Telehealth: Payer: Self-pay

## 2011-11-07 LAB — HEMOGLOBIN A1C
Hgb A1c MFr Bld: 5.8 % — ABNORMAL HIGH (ref <5.7)
Mean Plasma Glucose: 120 mg/dL — ABNORMAL HIGH (ref <117)

## 2011-11-07 MED ORDER — ALBUTEROL SULFATE HFA 108 (90 BASE) MCG/ACT IN AERS: 2.0000 | INHALATION_SPRAY | Freq: Four times a day (QID) | RESPIRATORY_TRACT | Status: DC | PRN

## 2011-11-07 NOTE — Telephone Encounter (Signed)
States she has been taking the medicine but she is still having sob and wheezing and wants to know if [facility] will [facility]

## 2011-11-07 NOTE — Telephone Encounter (Signed)
Med has been sent pls let her know

## 2011-11-07 NOTE — Telephone Encounter (Signed)
Patient aware.

## 2011-11-08 NOTE — Assessment & Plan Note (Signed)
Acute episode antibiotic prescribed 

## 2011-11-08 NOTE — Assessment & Plan Note (Signed)
Acute episode, decongestants, antibiotics, inhaler and cough suppressant prescribed

## 2011-11-08 NOTE — Assessment & Plan Note (Signed)
Controlled, no change in medication  

## 2011-11-08 NOTE — Assessment & Plan Note (Signed)
Deteriorated. Patient re-educated about  the importance of commitment to a  minimum of 150 minutes of exercise per week. The importance of healthy food choices with portion control discussed. Encouraged to start a food diary, count calories and to consider  joining a support group. Sample diet sheets offered. Goals set by the patient for the next several months.    

## 2011-11-08 NOTE — Progress Notes (Signed)
  Subjective:    Patient ID: Kaylee Montgomery, female    DOB: 05/26/1981, 31 y.o.   MRN: [id number]  HPI 4 day h/o increased head and chest congestion with yellow nasal drainage, sore throat and cough productive of yellow sputum, symptoms have worsened, she reports excessive nocturnal cough disturbing her sleep, and also wheezing and chest tightness.she reports intermittent chills, no documented fever, generalized aches. Prior to this she has been well, headaches still occur, she has not returned to neurology, despite having this in the plan.and being referred on more than one occasion. He has not started her weight program yet either   Review of Systems See HPI  Denies chest pains, palpitations and leg swelling Denies abdominal pain, nausea, vomiting,diarrhea or constipation.   Denies dysuria, frequency, hesitancy or incontinence.  Denies headaches, seizures, numbness, or tingling. Denies depression, anxiety or insomnia. Denies skin break down or rash.        Objective:   Physical Exam Patient alert and oriented and in no cardiopulmonary distress.  HEENT: No facial asymmetry, EOMI, frontal and maxillary sinus tenderness,  oropharynx pink and moist.  Neck supple bilateral cervical adenopathy.  Chest: decreased air entry, scattered crackles and wheezes  CVS: S1, S2 no murmurs, no S3.  ABD: Soft non tender. Bowel sounds normal.  Ext: No edema  MS: Adequate ROM spine, shoulders, hips and knees.  Skin: Intact, no ulcerations or rash noted.  Psych: Good eye contact, normal affect. Memory intact not anxious or depressed appearing.  CNS: CN 2-12 intact, power, tone and sensation normal throughout.        Assessment & Plan:

## 2011-11-09 ENCOUNTER — Emergency Department (HOSPITAL_COMMUNITY)
Admission: EM | Admit: 2011-11-09 | Discharge: 2011-11-09 | Disposition: A | Discharge status: Home / Self Care | Payer: 59 | Attending: Emergency Medicine | Admitting: Emergency Medicine

## 2011-11-09 ENCOUNTER — Emergency Department (HOSPITAL_COMMUNITY): Payer: 59

## 2011-11-09 ENCOUNTER — Encounter (HOSPITAL_COMMUNITY): Payer: Self-pay

## 2011-11-09 DIAGNOSIS — J329 Chronic sinusitis, unspecified: Secondary | ICD-10-CM | POA: Insufficient documentation

## 2011-11-09 DIAGNOSIS — J069 Acute upper respiratory infection, unspecified: Secondary | ICD-10-CM | POA: Insufficient documentation

## 2011-11-09 DIAGNOSIS — Z79899 Other long term (current) drug therapy: Secondary | ICD-10-CM | POA: Insufficient documentation

## 2011-11-09 DIAGNOSIS — H9209 Otalgia, unspecified ear: Secondary | ICD-10-CM | POA: Insufficient documentation

## 2011-11-09 DIAGNOSIS — R0602 Shortness of breath: Secondary | ICD-10-CM | POA: Insufficient documentation

## 2011-11-09 LAB — RAPID STREP SCREEN (MED CTR MEBANE ONLY): Streptococcus, Group A Screen (Direct): NEGATIVE

## 2011-11-09 MED ORDER — PSEUDOEPHEDRINE HCL 60 MG PO TABS
60.0000 mg | ORAL_TABLET | Freq: Once | ORAL | Status: AC
  Administered 2011-11-09: 60 mg via ORAL
  Filled 2011-11-09: qty 1

## 2011-11-09 MED ORDER — PSEUDOEPHEDRINE HCL 60 MG PO TABS: 60.0000 mg | ORAL_TABLET | Freq: Four times a day (QID) | ORAL | Status: AC | PRN

## 2011-11-09 MED ORDER — HYDROCODONE-ACETAMINOPHEN 5-325 MG PO TABS: ORAL_TABLET | ORAL | Status: DC

## 2011-11-09 MED ORDER — HYDROCODONE-ACETAMINOPHEN 5-325 MG PO TABS
2.0000 | ORAL_TABLET | Freq: Once | ORAL | Status: AC
  Administered 2011-11-09: 2 via ORAL
  Filled 2011-11-09: qty 2

## 2011-11-09 NOTE — ED Notes (Signed)
Pt presents with cough, congestion, runny nose, right ear pain, sore throat, and SOB x 1 week. Pt has been seen by PMD and started taking PCN since Thursday with no improvement in symptoms. NAD at this time.

## 2011-11-09 NOTE — ED Notes (Signed)
Pt a/ox4. Resp even and unlabored. NAD at this time. D/C instructions reviewed with pt. Pt verbalized understanding. Pt ambulated to lobby with steady gate.  

## 2011-11-09 NOTE — ED Provider Notes (Signed)
History     CSN: [id number]  Arrival date & time 11/09/11  1129   None     Chief Complaint  Patient presents with  . Cough  . Sore Throat  . Shortness of Breath  . Otalgia  . Nasal Congestion    (Consider location/radiation/quality/duration/timing/severity/associated sxs/prior treatment) HPI Comments: Pt has had cold symptoms for the past 7 days. She was seen by Dr Lodema Hong 3 days ago  Patient is a 31 y.o. female presenting with cough, pharyngitis, shortness of breath, and ear pain.  Cough This is a new problem. The current episode started more than 1 week ago. The problem occurs hourly. The problem has been gradually worsening. The cough is productive of sputum. The maximum temperature recorded prior to her arrival was 100 to 100.9 F. Associated symptoms include chills, sweats, ear pain, headaches, rhinorrhea, sore throat, myalgias and shortness of breath. She has tried cough syrup for the symptoms. The treatment provided no relief. She is not a smoker. Her past medical history is significant for bronchitis. Her past medical history does not include pneumonia, emphysema or asthma.  Sore Throat Associated symptoms include chills, coughing, headaches, myalgias and a sore throat.  Shortness of Breath  Associated symptoms include rhinorrhea, sore throat, cough and shortness of breath. Her past medical history does not include asthma.  Otalgia Associated symptoms include headaches, rhinorrhea, sore throat and cough.    Past Medical History  Diagnosis Date  . Pituitary adenoma   . Gynecomastia   . ALLERGIC RHINITIS   . Sinusitis   . Obesity     Past Surgical History  Procedure Date  . Caesarean   . Tonsillectomy     No family history on file.  History  Substance Use Topics  . Smoking status: Never Smoker   . Smokeless tobacco: Not on file  . Alcohol Use: No    OB History    Grav Para Term Preterm Abortions TAB SAB Ect Mult Living                  Review of  Systems  Constitutional: Positive for chills.  HENT: Positive for ear pain, sore throat and rhinorrhea.   Respiratory: Positive for cough and shortness of breath.   Musculoskeletal: Positive for myalgias.  Neurological: Positive for headaches.    Allergies  Bromocriptine mesylate  Home Medications   Current Outpatient Rx  Name Route Sig Dispense Refill  . ALBUTEROL SULFATE HFA 108 (90 BASE) MCG/ACT IN AERS Inhalation Inhale 2 puffs into the lungs every 6 (six) hours as needed for wheezing. 18 g 0  . BENZONATATE 100 MG PO CAPS Oral Take 1 capsule (100 mg total) by mouth every 6 (six) hours as needed for cough. 30 capsule 0  . HYDROCODONE-ACETAMINOPHEN 5-325 MG PO TABS  1 or 2 po q4h prn pain 20 tablet 0  . IBUPROFEN 800 MG PO TABS  TAKE 1 TABLET BY MOUTH EVERY 8 HOURS AS NEEDED FOR PAIN. 30 tablet 3  . NYSTOP 100000 UNIT/GM EX POWD Topical Apply topically. Apply twice a day for 5 days to rash under the breast when needed     . PENICILLIN V POTASSIUM 500 MG PO TABS Oral Take 1 tablet (500 mg total) by mouth 3 (three) times daily. 30 tablet 0  . PHENTERMINE HCL 37.5 MG PO CAPS Oral Take 37.5 mg by mouth daily. Phentermine HCL 37.5 mg tabs     . PREDNISONE (PAK) 5 MG PO TABS Oral Take 1 tablet (  5 mg total) by mouth as directed. Use as directed 21 tablet 0  . PROMETHAZINE-DM 6.25-15 MG/5ML PO SYRP  5 cc every 8 hours as needed for cough 240 mL 0  . PSEUDOEPHEDRINE HCL 60 MG PO TABS Oral Take 1 tablet (60 mg total) by mouth every 6 (six) hours as needed for congestion. 30 tablet 0  . TIZANIDINE HCL 4 MG PO TABS  One tablet at bedtime for shoulder spasm, and neck spasm for 1 week, then as needed 30 tablet 0  . TRAMADOL HCL 50 MG PO TABS Oral Take 50 mg by mouth. Take one tablet by mouth two times a day as needed for uncontrolled pain     . TRIAMTERENE-HCTZ 37.5-25 MG PO TABS  TAKE 1 TABLET BY MOUTH ONCE A DAY 30 tablet 3    BP 138/96  Pulse 107  Temp(Src) 99.9 F (37.7 C) (Oral)  Resp 22   Ht 5\' 5"  (1.651 m)  Wt 223 lb (101.152 kg)  BMI 37.11 kg/m2  SpO2 97%  Physical Exam  Nursing note and vitals reviewed. Constitutional: She is oriented to person, place, and time. She appears well-developed and well-nourished.  Non-toxic appearance.  HENT:  Head: Normocephalic.  Right Ear: Tympanic membrane and external ear normal.  Left Ear: Tympanic membrane and external ear normal.       Nasal congestion. Uvula mild to moderately enlarged. No exudate. No bulging TM. Mild redness of the right TM.  Eyes: EOM and lids are normal. Pupils are equal, round, and reactive to light.  Neck: Normal range of motion. Neck supple. Carotid bruit is not present.  Cardiovascular: Normal rate, regular rhythm, normal heart sounds, intact distal pulses and normal pulses.   Pulmonary/Chest: Breath sounds normal. No respiratory distress.       Scattered rhonchi noted.   Abdominal: Soft. Bowel sounds are normal. There is no tenderness. There is no guarding.  Musculoskeletal: Normal range of motion.  Lymphadenopathy:       Head (right side): No submandibular adenopathy present.       Head (left side): No submandibular adenopathy present.    She has no cervical adenopathy.  Neurological: She is alert and oriented to person, place, and time. She has normal strength. No cranial nerve deficit or sensory deficit.  Skin: Skin is warm and dry.  Psychiatric: She has a normal mood and affect. Her speech is normal.    ED Course  Procedures (including critical care time)   Labs Reviewed  RAPID STREP SCREEN   Dg Chest 2 View  11/09/2011  *RADIOLOGY REPORT*  Clinical Data: Cough  CHEST - 2 VIEW  Comparison: 08/27/2010  Findings: Lungs are clear.  No pleural effusion or pneumothorax.  The heart is top normal in size.  Visualized osseous structures are within normal limits.  IMPRESSION: No evidence of acute cardiopulmonary disease.  Original Report Authenticated By: Charline Bills, M.D.     1. Sinusitis   2.  URI (upper respiratory infection)       MDM  I have reviewed nursing notes, vital signs, and all appropriate lab and imaging results for this patient.  Pt to continue meds from Dr Lodema Hong. Add Norco and sudafed.      Kathie Dike, PA 11/11/11 1610  Kathie Dike, PA 11/11/11 0900

## 2011-11-09 NOTE — ED Notes (Signed)
Pt presents with cough, sore throat, nasal congestion, SOB and right ear pain x 1 week. Pt saw PMD Thursday and was placed on PCN, Phenergan cough syrup and Prednisone. Pt states medications have not improved symptoms. Pt actively coughing. Pt with slight wheezing. NAD at this time.

## 2011-11-11 NOTE — ED Provider Notes (Signed)
Medical screening examination/treatment/procedure(s) were performed by non-physician practitioner and as supervising physician I was immediately available for consultation/collaboration. Devoria Albe, MD, Armando Gang   Ward Givens, MD 11/11/11 1032

## 2012-02-09 ENCOUNTER — Encounter: Payer: Self-pay | Admitting: Family Medicine

## 2012-02-09 ENCOUNTER — Ambulatory Visit (INDEPENDENT_AMBULATORY_CARE_PROVIDER_SITE_OTHER): Payer: 59 | Admitting: Family Medicine

## 2012-02-09 VITALS — BP 134/98 | HR 122 | Temp 99.7°F | Resp 16 | Ht 65.5 in | Wt 217.0 lb

## 2012-02-09 DIAGNOSIS — J329 Chronic sinusitis, unspecified: Secondary | ICD-10-CM

## 2012-02-09 DIAGNOSIS — R5383 Other fatigue: Secondary | ICD-10-CM

## 2012-02-09 DIAGNOSIS — R5381 Other malaise: Secondary | ICD-10-CM

## 2012-02-09 DIAGNOSIS — E669 Obesity, unspecified: Secondary | ICD-10-CM

## 2012-02-09 DIAGNOSIS — Z13220 Encounter for screening for lipoid disorders: Secondary | ICD-10-CM

## 2012-02-09 DIAGNOSIS — J40 Bronchitis, not specified as acute or chronic: Secondary | ICD-10-CM

## 2012-02-09 DIAGNOSIS — I10 Essential (primary) hypertension: Secondary | ICD-10-CM

## 2012-02-09 DIAGNOSIS — J0110 Acute frontal sinusitis, unspecified: Secondary | ICD-10-CM | POA: Insufficient documentation

## 2012-02-09 DIAGNOSIS — Z139 Encounter for screening, unspecified: Secondary | ICD-10-CM

## 2012-02-09 MED ORDER — IBUPROFEN 800 MG PO TABS: ORAL_TABLET | ORAL | Status: DC

## 2012-02-09 MED ORDER — SULFAMETHOXAZOLE-TRIMETHOPRIM 800-160 MG PO TABS: 1.0000 | ORAL_TABLET | Freq: Two times a day (BID) | ORAL | Status: AC

## 2012-02-09 MED ORDER — TRIAMTERENE-HCTZ 37.5-25 MG PO TABS: ORAL_TABLET | ORAL | Status: DC

## 2012-02-09 MED ORDER — BENZONATATE 100 MG PO CAPS: 100.0000 mg | ORAL_CAPSULE | Freq: Four times a day (QID) | ORAL | Status: DC | PRN

## 2012-02-09 MED ORDER — PHENTERMINE HCL 37.5 MG PO CAPS: 37.5000 mg | ORAL_CAPSULE | Freq: Every day | ORAL | Status: DC

## 2012-02-09 NOTE — Patient Instructions (Signed)
cPE in 3 month.  Fasting lipid, cmp, HBA1C and TSH and vit d in 3 month  You are treated for acute sinusitis and bronchitis, med is sent to your pharmacy.  Work excuse from 4/22 to 02/10/2012  It is important that you exercise regularly at least 30 minutes 5 times a week. If you develop chest pain, have severe difficulty breathing, or feel very tired, stop exercising immediately and seek medical attention   A healthy diet is rich in fruit, vegetables and whole grains. Poultry fish, nuts and beans are a healthy choice for protein rather then red meat. A low sodium diet and drinking 64 ounces of water daily is generally recommended. Oils and sweet should be limited. Carbohydrates especially for those who are diabetic or overweight, should be limited to 0-45 gram per meal. It is important to eat on a regular schedule, at least 3 times daily. Snacks should be primarily fruits, vegetables or nuts.  Weight loss goal of 3 to 4 pounds per month

## 2012-02-09 NOTE — Progress Notes (Signed)
  Subjective:    Patient ID: Kaylee Montgomery, female    DOB: [birth year], 31 y.o.   MRN: [id number]  HPI 3 day h/o head and chest congestion, brown nasal discharge and sputum, chills, fatigue , no fever documented   Review of Systems See HPI Denies chest pains, palpitations and leg swelling Denies abdominal pain, nausea, vomiting,diarrhea or constipation.   Denies dysuria, frequency, hesitancy or incontinence. Denies joint pain, swelling and limitation in mobility. Denies headaches, seizures, numbness, or tingling. Denies depression, anxiety or insomnia. Denies skin break down or rash.        Objective:   Physical Exam Patient alert and oriented and in no cardiopulmonary distress.Ill appearing  HEENT: No facial asymmetry, EOMI, frontal  sinus tenderness,  oropharynx pink and moist.  Neck supple bilateral anterior cervical  adenopathy.  Chest: decreased air entry, scattered crackles, few wheezes CVS: S1, S2 no murmurs, no S3.  ABD: Soft non tender. Bowel sounds normal.  Ext: No edema  MS: Adequate ROM spine, shoulders, hips and knees.  Skin: Intact, no ulcerations or rash noted.  Psych: Good eye contact, normal affect. Memory intact not anxious or depressed appearing.  CNS: CN 2-12 intact, power, tone and sensation normal throughout.        Assessment & Plan:

## 2012-02-23 NOTE — Assessment & Plan Note (Signed)
Antibiotics prescribed 

## 2012-02-23 NOTE — Assessment & Plan Note (Signed)
Antibiotic and decongestant prescribed 

## 2012-02-23 NOTE — Assessment & Plan Note (Signed)
Controlled, no change in medication  

## 2012-02-23 NOTE — Assessment & Plan Note (Signed)
Improved. Pt applauded on succesful weight loss through lifestyle change, and encouraged to continue same. Weight loss goal set for the next several months.  

## 2012-05-06 ENCOUNTER — Ambulatory Visit (INDEPENDENT_AMBULATORY_CARE_PROVIDER_SITE_OTHER): Payer: 59 | Admitting: Family Medicine

## 2012-05-06 ENCOUNTER — Encounter: Payer: Self-pay | Admitting: Family Medicine

## 2012-05-06 VITALS — BP 120/80 | HR 88 | Resp 16 | Ht 65.5 in | Wt 224.8 lb

## 2012-05-06 DIAGNOSIS — R5383 Other fatigue: Secondary | ICD-10-CM

## 2012-05-06 DIAGNOSIS — M542 Cervicalgia: Secondary | ICD-10-CM

## 2012-05-06 DIAGNOSIS — B009 Herpesviral infection, unspecified: Secondary | ICD-10-CM

## 2012-05-06 DIAGNOSIS — J309 Allergic rhinitis, unspecified: Secondary | ICD-10-CM

## 2012-05-06 DIAGNOSIS — I10 Essential (primary) hypertension: Secondary | ICD-10-CM

## 2012-05-06 DIAGNOSIS — M899 Disorder of bone, unspecified: Secondary | ICD-10-CM

## 2012-05-06 DIAGNOSIS — J329 Chronic sinusitis, unspecified: Secondary | ICD-10-CM

## 2012-05-06 DIAGNOSIS — R5381 Other malaise: Secondary | ICD-10-CM

## 2012-05-06 DIAGNOSIS — M949 Disorder of cartilage, unspecified: Secondary | ICD-10-CM

## 2012-05-06 DIAGNOSIS — E669 Obesity, unspecified: Secondary | ICD-10-CM

## 2012-05-06 DIAGNOSIS — B001 Herpesviral vesicular dermatitis: Secondary | ICD-10-CM

## 2012-05-06 MED ORDER — IBUPROFEN 800 MG PO TABS: ORAL_TABLET | ORAL | Status: DC

## 2012-05-06 MED ORDER — PREDNISONE (PAK) 5 MG PO TABS: 5.0000 mg | ORAL_TABLET | ORAL | Status: DC

## 2012-05-06 MED ORDER — DIAZEPAM 5 MG PO TABS: ORAL_TABLET | ORAL | Status: DC

## 2012-05-06 NOTE — Patient Instructions (Addendum)
cpe in 3 month  CBC, fasting lipid, chem 7, tSH, vit D and HbA1C in 3 month before next visit  You are being treated with anti inflammatories and muscle relaxant for neck pain and spasm Tylenol , 2 tablets will be given in the office.  Prednisone will help sinus pressure, please do saline nasal flushes once  daily and you may take sudafed one daily for uncontrolled pressure and drainage  Blood pressure is good  Medication is sent in for fever blister  It is important that you exercise regularly at least 30 minutes 5 times a week. If you develop chest pain, have severe difficulty breathing, or feel very tired, stop exercising immediately and seek medical attention   A healthy diet is rich in fruit, vegetables and whole grains. Poultry fish, nuts and beans are a healthy choice for protein rather then red meat. A low sodium diet and drinking 64 ounces of water daily is generally recommended. Oils and sweet should be limited. Carbohydrates especially for those who are diabetic or overweight, should be limited to 30-45 gram per meal. It is important to eat on a regular schedule, at least 3 times daily. Snacks should be primarily fruits, vegetables or nuts.

## 2012-05-06 NOTE — Progress Notes (Signed)
  Subjective:    Patient ID: Kaylee Montgomery, female    DOB: 06-23-1981, 31 y.o.   MRN: [id number]  HPI 1 week h/o neck painn radiating up the back of head and to both shoulders, rated 6, assocd with muscle spasm in the shoulders, no blurry vision, weakness or numbness, no fever or chills , no neck stiffness. 3 day h/o painful blister at corner of right upper lip Still uncomited to regular exercise and dietary change hence weight is unchanged   Review of Systems See HPI Denies recent fever or chills. C/o frontal  sinus pressure and  nasal congestion,denies ear pain  or sore throat.Drainage , when obtained , is clear Denies chest congestion, productive cough or wheezing. Denies chest pains, palpitations and leg swelling Denies abdominal pain, nausea, vomiting,diarrhea or constipation.   Denies dysuria, frequency, hesitancy or incontinence.  Denies headaches, seizures, numbness, or tingling. Denies depression, anxiety or insomnia.       Objective:   Physical Exam  Patient alert and oriented and in no cardiopulmonary distress.  HEENT: No facial asymmetry, EOMI, no sinus tenderness,  oropharynx pink and moist.  Neck decreased ROM, spasm in shoulder muscles, no adenopathy.Nasal mucosa erythematous and edematous, TM clear  Chest: Clear to auscultation bilaterally.  CVS: S1, S2 no murmurs, no S3.  ABD: Soft non tender. Bowel sounds normal.  Ext: No edema  MS: Adequate ROM spine, shoulders, hips and knees.  Skin: Intact, no ulcerations or rash noted.  Psych: Good eye contact, normal affect. Memory intact not anxious or depressed appearing.  CNS: CN 2-12 intact, power, tone and sensation normal throughout.       Assessment & Plan:

## 2012-05-07 ENCOUNTER — Telehealth: Payer: Self-pay | Admitting: *Deleted

## 2012-05-07 DIAGNOSIS — B001 Herpesviral vesicular dermatitis: Secondary | ICD-10-CM | POA: Insufficient documentation

## 2012-05-07 MED ORDER — ACYCLOVIR 400 MG PO TABS: 400.0000 mg | ORAL_TABLET | Freq: Three times a day (TID) | ORAL | Status: AC

## 2012-05-07 NOTE — Telephone Encounter (Signed)
pls let pt know acyclovir has been called in and valium also sent

## 2012-05-07 NOTE — Telephone Encounter (Signed)
Pt aware.

## 2012-05-08 NOTE — Assessment & Plan Note (Signed)
Prednisone dose pack and nasal flushes at least once daily

## 2012-05-08 NOTE — Assessment & Plan Note (Signed)
unchanged Patient re-educated about  the importance of commitment to a  minimum of 150 minutes of exercise per week. The importance of healthy food choices with portion control discussed. Encouraged to start a food diary, count calories and to consider  joining a support group. Sample diet sheets offered. Goals set by the patient for the next several months.    

## 2012-05-08 NOTE — Assessment & Plan Note (Signed)
Controlled, no change in medication  

## 2012-05-08 NOTE — Assessment & Plan Note (Signed)
Acute episode right upper lip, antiviral prescribed

## 2012-05-08 NOTE — Assessment & Plan Note (Signed)
Acute flare with spasm, anti inflammatories and muscle relaxant prscribed

## 2012-05-10 ENCOUNTER — Encounter: Payer: 59 | Admitting: Family Medicine

## 2012-05-12 ENCOUNTER — Other Ambulatory Visit: Payer: Self-pay | Admitting: Family Medicine

## 2012-05-12 ENCOUNTER — Telehealth: Payer: Self-pay | Admitting: Family Medicine

## 2012-05-12 DIAGNOSIS — R51 Headache: Secondary | ICD-10-CM

## 2012-05-12 NOTE — Telephone Encounter (Signed)
Referral entered pls sched appt with nerurologist of pt's choice

## 2012-05-25 ENCOUNTER — Other Ambulatory Visit: Payer: Self-pay | Admitting: Family Medicine

## 2012-05-26 ENCOUNTER — Telehealth: Payer: Self-pay | Admitting: Family Medicine

## 2012-05-28 NOTE — Telephone Encounter (Signed)
Patient is aware 

## 2012-06-16 ENCOUNTER — Ambulatory Visit (INDEPENDENT_AMBULATORY_CARE_PROVIDER_SITE_OTHER): Payer: 59 | Admitting: Family Medicine

## 2012-06-16 ENCOUNTER — Encounter: Payer: Self-pay | Admitting: Family Medicine

## 2012-06-16 VITALS — BP 120/88 | HR 95 | Resp 16 | Ht 65.5 in | Wt 223.4 lb

## 2012-06-16 DIAGNOSIS — E669 Obesity, unspecified: Secondary | ICD-10-CM

## 2012-06-16 DIAGNOSIS — M542 Cervicalgia: Secondary | ICD-10-CM

## 2012-06-16 DIAGNOSIS — I10 Essential (primary) hypertension: Secondary | ICD-10-CM

## 2012-06-16 DIAGNOSIS — R51 Headache: Secondary | ICD-10-CM

## 2012-06-16 MED ORDER — METHYLPREDNISOLONE ACETATE 80 MG/ML IJ SUSP
80.0000 mg | Freq: Once | INTRAMUSCULAR | Status: AC
  Administered 2012-06-16: 80 mg via INTRAMUSCULAR

## 2012-06-16 MED ORDER — CYCLOBENZAPRINE HCL 10 MG PO TABS: 10.0000 mg | ORAL_TABLET | Freq: Three times a day (TID) | ORAL | Status: DC | PRN

## 2012-06-16 MED ORDER — KETOROLAC TROMETHAMINE 60 MG/2ML IJ SOLN
60.0000 mg | Freq: Once | INTRAMUSCULAR | Status: AC
  Administered 2012-06-16: 60 mg via INTRAMUSCULAR

## 2012-06-16 MED ORDER — PREDNISONE (PAK) 5 MG PO TABS: 5.0000 mg | ORAL_TABLET | ORAL | Status: DC

## 2012-06-16 NOTE — Patient Instructions (Signed)
F/u as before.  You have received toradol and depo medrol in the office today for headache.I am also sending in a prednisone dose pack You need to take muscle relaxant 3 times daily for the next 3 days, I will also let Dr Anne Hahn know of your current flare, to see if he wants to see you sooner.  I will refer you for physical therapy for your neck   You are referred for Brain and neck MRI due to increased frequency and severity in your headaches in the past 4 to 6 months  Work excuse from 8/28 to return 06/21/2012  Continue medication for headache control as before , from the neurologist.  Use vicodin from the dentist only if you have severe pain

## 2012-06-17 ENCOUNTER — Ambulatory Visit (HOSPITAL_COMMUNITY)
Admission: RE | Admit: 2012-06-17 | Discharge: 2012-06-17 | Disposition: A | Discharge status: Home / Self Care | Payer: 59 | Source: Ambulatory Visit | Attending: Family Medicine | Admitting: Family Medicine

## 2012-06-17 DIAGNOSIS — R51 Headache: Secondary | ICD-10-CM | POA: Insufficient documentation

## 2012-06-17 DIAGNOSIS — M542 Cervicalgia: Secondary | ICD-10-CM | POA: Insufficient documentation

## 2012-06-17 DIAGNOSIS — M25519 Pain in unspecified shoulder: Secondary | ICD-10-CM | POA: Insufficient documentation

## 2012-06-21 NOTE — Progress Notes (Signed)
  Subjective:    Patient ID: Kaylee Montgomery, female    DOB: 13-May-1981, 31 y.o.   MRN: [id number]  HPI 3 day h/o uncontroled and severe posterior headache radiating down neck, no associated trauma, increased stress, or localized neurologic symptoms Recently evaluated by neurology, started on tricyclic and referred for PT for neck pain She denies fever, chills , or sinus drainage. States unable to work due t symptoms, in particular neck stiffness, neck pain radiates down upper extremities  Review of Systems See HPI Denies recent fever or chills. Denies sinus pressure, nasal congestion, ear pain or sore throat. Denies chest congestion, productive cough or wheezing. Denies chest pains, palpitations and leg swelling Denies abdominal pain, nausea, vomiting,diarrhea or constipation.   Denies dysuria, frequency, hesitancy or incontinence.  Denies depression, anxiety or insomnia. Denies skin break down or rash.        Objective:   Physical Exam Patient alert and oriented and in no cardiopulmonary distress.Pt in pain  HEENT: No facial asymmetry, EOMI, no sinus tenderness,  oropharynx pink and moist.  Neck decreased ROM, with spasm, no adenopathy.Fundoscopy: no hemmorhage noted  Chest: Clear to auscultation bilaterally.  CVS: S1, S2 no murmurs, no S3.  ABD: Soft non tender. Bowel sounds normal.  Ext: No edema  MS: Adequate ROM thoracolumbar spine, shoulders, hips and knees.  Skin: Intact, no ulcerations or rash noted.  Psych: Good eye contact, normal affect. Memory intact mildlyxious not depressed appearing.  CNS: CN 2-12 intact, power, tone and sensation normal throughout.        Assessment & Plan:

## 2012-06-21 NOTE — Assessment & Plan Note (Addendum)
Unchanged. Patient re-educated about  the importance of commitment to a  minimum of 150 minutes of exercise per week. The importance of healthy food choices with portion control discussed. Encouraged to start a food diary, count calories and to consider  joining a support group. Sample diet sheets offered. Goals set by the patient for the next several months.    

## 2012-06-21 NOTE — Assessment & Plan Note (Signed)
Controlled, no change in medication  

## 2012-06-21 NOTE — Assessment & Plan Note (Signed)
Severe and disabling, symptoms of radiculopathy, sched MRI neck, also refer to PT, aggressive course of muscle relaxants also

## 2012-06-21 NOTE — Assessment & Plan Note (Signed)
Deteriorated and uncontrolled, will refer for brain scan, anti inflammatories  In office with aggressive oral course, also muscle relaxants Work excuse based on symptomatology and exam

## 2012-06-24 ENCOUNTER — Ambulatory Visit (HOSPITAL_COMMUNITY)
Admission: RE | Admit: 2012-06-24 | Discharge: 2012-06-24 | Disposition: A | Discharge status: Home / Self Care | Payer: 59 | Source: Ambulatory Visit | Attending: Family Medicine | Admitting: Family Medicine

## 2012-06-24 DIAGNOSIS — M542 Cervicalgia: Secondary | ICD-10-CM | POA: Insufficient documentation

## 2012-06-24 DIAGNOSIS — R519 Headache, unspecified: Secondary | ICD-10-CM | POA: Diagnosis present

## 2012-06-24 DIAGNOSIS — IMO0001 Reserved for inherently not codable concepts without codable children: Secondary | ICD-10-CM | POA: Insufficient documentation

## 2012-06-24 DIAGNOSIS — R51 Headache: Secondary | ICD-10-CM | POA: Insufficient documentation

## 2012-06-24 DIAGNOSIS — M6281 Muscle weakness (generalized): Secondary | ICD-10-CM | POA: Insufficient documentation
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
## 2012-06-28 ENCOUNTER — Ambulatory Visit (HOSPITAL_COMMUNITY)
Admission: RE | Admit: 2012-06-28 | Discharge: 2012-06-28 | Disposition: A | Discharge status: Home / Self Care | Payer: 59 | Source: Ambulatory Visit | Attending: Family Medicine | Admitting: Family Medicine

## 2012-06-28 NOTE — Progress Notes (Signed)
Physical Therapy Treatment Patient Details  Name: Kaylee Montgomery MRN: [id number] Date of Birth: [birth year]  Today's Date: 06/28/2012 Time: 0807-0900 PT Time Calculation (min): 53 min  Visit#: 2  of 12   Re-eval: 07/24/12 Charges: Therex x 24' Manual x 15' MHP x 10'  Subjective: Symptoms/Limitations Symptoms: Pt reports HEP compliance. Pain Assessment Currently in Pain?: Yes Pain Score:   5 Pain Location: Neck Pain Orientation: Right  Exercise/Treatments Stretches Upper Trapezius Stretch: 2 reps;30 seconds Levator Stretch: 2 reps;30 seconds Corner Stretch: 2 reps;30 seconds Theraband Exercises Scapula Retraction: 10 reps;Green Shoulder Extension: 10 reps;Green Rows: 10 reps;Green Standing Exercises Other Standing Exercises: Corner elbow press 10x 5" Seated Exercises Neck Retraction: 5 reps Other Seated Exercise: Scap retraction x10 Supine Exercises Neck Retraction: 10 reps;5 secs  Manual Therapy Manual Therapy: Massage Massage: STM with some MFR techniques to B upper trap and subocciptial region; focus greater on R than L  Physical Therapy Assessment and Plan PT Assessment and Plan Clinical Impression Statement: Pt requires multimodal cueing with scapular strengthening exercise to avoid UT compensation. Pt presents with forward head posture and requires frequent cueing to correct this while completing therex. Massage completed to B upper trap and suboccipital region to decrease tightness and pain. MHP applied at end of session to decreased pain. Pt reports pain decrease to 3-4/10 at end of session. Pt will benefit from skilled therapeutic intervention in order to improve on the following deficits:  (musculoskeletal headaches) PT Plan: Continue to decrease pain and improve strength/posture per PT POC.    Goals    Problem List Patient Active Problem List  Diagnosis  . PITUITARY ADENOMA  . HYPERPROLACTINEMIA  . OBESITY  . HYPERTENSION, ESSENTIAL  . SINUSITIS  .  ALLERGIC RHINITIS  . GYNECOMASTIA  . GALACTORRHEA  . VULVOVAGINITIS  . DYSFUNCTIONAL UTERINE BLEEDING  . NECK PAIN  . FATIGUE  . Headache  . Bronchitis  . Fever blister    PT - End of Session Activity Tolerance: Patient tolerated treatment well General Behavior During Session: [facility] for tasks performed Cognition: [facility] for tasks performed PT Plan of Care PT Patient Instructions: Pt educated on log rolling technique when going from supine to sit to decrease stress on neck and back.  Seth Bake, PTA 06/28/2012, 9:31 AM

## 2012-06-30 ENCOUNTER — Ambulatory Visit (HOSPITAL_COMMUNITY)
Admission: RE | Admit: 2012-06-30 | Discharge: 2012-06-30 | Disposition: A | Discharge status: Home / Self Care | Payer: 59 | Source: Ambulatory Visit | Attending: Family Medicine | Admitting: Family Medicine

## 2012-06-30 NOTE — Progress Notes (Signed)
Physical Therapy Treatment Patient Details  Name: Kaylee Montgomery MRN: [id number] Date of Birth: 1981/02/12  Today's Date: 06/30/2012 Time: 0810-0900 PT Time Calculation (min): 50 min  Visit#: 3  of 12   Re-eval: 07/24/12  Charge: Therex 23', Manual 15, MHP 10'  Authorization:    Authorization Time Period:    Authorization Visit#:   of     Subjective: Symptoms/Limitations Symptoms: Pt 10 minutes late for apt.  Pt stated pain free at entrance today. Pain Assessment Currently in Pain?: No/denies  Objective:  Exercise/Treatments Stretches Upper Trapezius Stretch: 2 reps;30 seconds Levator Stretch: 2 reps;30 seconds Machines for Strengthening UBE (Upper Arm Bike): 4' backwards for posture Theraband Exercises Scapula Retraction: 10 reps;Green Shoulder Extension: 10 reps;Green Rows: 10 reps;Green Seated Exercises Neck Retraction: 10 reps W Back: 10 reps Postural Training: Educated on proper sitting posture Other Seated Exercise: Scap retraction x10  Modalities Modalities: Moist Heat Manual Therapy Massage: STM with some MFR techniques to B upper trap and subocciptial region; focus greater on R than L; Suboccipital release 3 reps x 2 min each= total 15' Moist Heat Therapy Number Minutes Moist Heat: 10 Minutes Moist Heat Location:  (neck)  Physical Therapy Assessment and Plan PT Assessment and Plan Clinical Impression Statement: Added postural strengthening exercises for scapular strengthening with multimodal cueing required to avoid UT compensation and for posture with sitting activities.  Suboccipital release complete at end of session wtih good results, pt encouraged to drink extra water throughout the day to reduce risk of headaches. PT Plan: Continue to decrease pain and improve strength/posture per PT POC    Goals    Problem List Patient Active Problem List  Diagnosis  . PITUITARY ADENOMA  . HYPERPROLACTINEMIA  . OBESITY  . HYPERTENSION, ESSENTIAL  .  SINUSITIS  . ALLERGIC RHINITIS  . GYNECOMASTIA  . GALACTORRHEA  . VULVOVAGINITIS  . DYSFUNCTIONAL UTERINE BLEEDING  . NECK PAIN  . FATIGUE  . Headache  . Bronchitis  . Fever blister    PT - End of Session Activity Tolerance: Patient tolerated treatment well General Behavior During Session: [facility] for tasks performed Cognition: [facility] for tasks performed  GP    Juel Burrow 06/30/2012, 12:07 PM

## 2012-07-01 ENCOUNTER — Ambulatory Visit (HOSPITAL_COMMUNITY)
Admission: RE | Admit: 2012-07-01 | Discharge: 2012-07-01 | Disposition: A | Discharge status: Home / Self Care | Payer: 59 | Source: Ambulatory Visit | Attending: Family Medicine | Admitting: Family Medicine

## 2012-07-01 NOTE — Progress Notes (Signed)
Physical Therapy Treatment Patient Details  Name: LAQUIDA COTRELL MRN: [id number] Date of Birth: [birth year]  Today's Date: 07/01/2012 Time: 0811-0853 PT Time Calculation (min): 42 min Charges: 1 hot pack, 25' Manual, 17' NMR Visit#: 4  of 12   Re-eval: 07/24/12   Subjective: Symptoms/Limitations Symptoms: Pt is 11 minutes late for apt today. She reports that she is not sure if it is the therapy or her migrane medication, but her migraines have not been as bad lately.  Pain Assessment Pain Score:   3 Pain Location: Head  Exercise/Treatments Seated Exercises Neck Retraction: 5 reps;Limitations (w/capital flexion ) Neck Retraction Limitations: cueing for diaphragmatic breathing between each rep Supine Exercises Other Supine Exercise: Star Gazers 3x1 min hold out, 3x10 sec hold in w/cueing for diaphragmatic breathing throughout   Manual Therapy Manual Therapy: Myofascial release Myofascial Release: To bilateral ear, cervical, pterygoid, forehead region w/STM after to decrease fascial restrcitons, subocciptial release 3x2-5 minute holds Other Manual Therapy: manual cervical traction 3x2 minu hold.  Manual x 25 minutes  Cervical Hot Pack x10 minutes to neck w/cueing for diaphragmatic breathing, tongue placement and relaxation techniques  Physical Therapy Assessment and Plan PT Assessment and Plan Clinical Impression Statement: Pt presented with significant fascial restrictions throughout entire cervical and fascial region which decreased by 75% after manual treatment.  Pt has increased anxiousness with manual therapy to anterior cervical region.  She had increased pain w/capital flexion exercises (likely due to muscle weakness).  She continues to have greatest limitations with posture.  Educated pt on posture in sleeping, sitting, standing.  Pt able to verbally report proper posture techniques.  PT Plan: Continue to decrease pain and improve strength/posture    Goals    Problem  List Patient Active Problem List  Diagnosis  . PITUITARY ADENOMA  . HYPERPROLACTINEMIA  . OBESITY  . HYPERTENSION, ESSENTIAL  . SINUSITIS  . ALLERGIC RHINITIS  . GYNECOMASTIA  . GALACTORRHEA  . VULVOVAGINITIS  . DYSFUNCTIONAL UTERINE BLEEDING  . NECK PAIN  . FATIGUE  . Headache  . Bronchitis  . Fever blister    PT - End of Session Activity Tolerance: Patient tolerated treatment well General Behavior During Session: [facility] for tasks performed Cognition: [facility] for tasks performed PT Plan of Care PT Patient Instructions: Educated pt on diaphragmatic breathing, tongue position, ear, shoulder hip position during stressful times.  Consulted and Agree with Plan of Care: Patient  Annett Fabian, PT 07/01/2012, 8:52 AM

## 2012-07-05 ENCOUNTER — Ambulatory Visit (HOSPITAL_COMMUNITY)
Admission: RE | Admit: 2012-07-05 | Discharge: 2012-07-05 | Disposition: A | Discharge status: Home / Self Care | Payer: 59 | Source: Ambulatory Visit | Attending: Family Medicine | Admitting: Family Medicine

## 2012-07-05 NOTE — Progress Notes (Signed)
Physical Therapy Treatment Patient Details  Name: Kaylee Montgomery MRN: [id number] Date of Birth: Jan 25, 1981  Today's Date: 07/05/2012 Time: 1478-2956 PT Time Calculation (min): 42 min  Visit#: 5  of 12   Re-eval: 07/24/12 Charges: Therex x 15' Manual x 25'   Subjective: Symptoms/Limitations Symptoms: Pt staets that she had a tooth restracted Saturday morning. She states that this made her tenses and increased her neck pain. Pain Assessment Currently in Pain?: Yes Pain Score:   6 Pain Location: Neck Pain Orientation: Right   Exercise/Treatments Theraband Exercises Scapula Retraction: 10 reps;Green Shoulder Extension: 10 reps;Green Rows: 10 reps;Green Seated Exercises Other Seated Exercise: Scap retraction x10 with vc's for form Supine Exercises Neck Retraction: 5 secs;5 reps (Stopped at 5 reps secondary to pain)  Manual Therapy Manual Therapy: Myofascial release Myofascial Release: To R cervical, pterygoid, forehead region w/STM after to decrease fascial restrcitons  Physical Therapy Assessment and Plan PT Assessment and Plan Clinical Impression Statement: Pt presents with increased pain this session. Tx focus on decreasing pain and tightness with manual techniques. Pt requires constant cueing to avoid forward head posture with therex. Pt reports pain decrease to 4/10 at end of session. PT Plan: Continue to decrease pain and improve strength/posture per PT POC.     Problem List Patient Active Problem List  Diagnosis  . PITUITARY ADENOMA  . HYPERPROLACTINEMIA  . OBESITY  . HYPERTENSION, ESSENTIAL  . SINUSITIS  . ALLERGIC RHINITIS  . GYNECOMASTIA  . GALACTORRHEA  . VULVOVAGINITIS  . DYSFUNCTIONAL UTERINE BLEEDING  . NECK PAIN  . FATIGUE  . Headache  . Bronchitis  . Fever blister    PT - End of Session Activity Tolerance: Patient tolerated treatment well;Patient limited by pain General Behavior During Session: [facility] for tasks performed Cognition: [facility] for  tasks performed  Seth Bake, PTA 07/05/2012, 9:33 AM

## 2012-07-07 ENCOUNTER — Inpatient Hospital Stay (HOSPITAL_COMMUNITY): Admission: RE | Admit: 2012-07-07 | Payer: 59 | Source: Ambulatory Visit

## 2012-07-08 ENCOUNTER — Ambulatory Visit (HOSPITAL_COMMUNITY)
Admission: RE | Admit: 2012-07-08 | Discharge: 2012-07-08 | Disposition: A | Discharge status: Home / Self Care | Payer: 59 | Source: Ambulatory Visit | Attending: Family Medicine | Admitting: Family Medicine

## 2012-07-08 NOTE — Progress Notes (Signed)
Physical Therapy Treatment Patient Details  Name: Kaylee Montgomery MRN: [id number] Date of Birth: 05-18-1981  Today's Date: 07/08/2012 Time: 0810-0848 PT Time Calculation (min): 38 min  Visit#: 6  of 12   Re-eval: 07/24/12  Charge: therex 23', manual 15'  Subjective: Symptoms/Limitations Symptoms: Pt 10 min late for apt today.  Pt stated pain free this session, no migrane today but did have a sharp one yesterday. Pt cancelled yesterday's session due to stomach ache.   Pain Assessment Currently in Pain?: No/denies  Objective:  Exercise/Treatments Theraband Exercises Scapula Retraction: 15 reps;Green Shoulder Extension: 15 reps;Green Rows: 15 reps;Green Seated Exercises Neck Retraction: 10 reps;Limitations Neck Retraction Limitations: cueing for diaphragmatic breathing between each rep W Back: 10 reps Money: 5 reps;5 secs  Manual Therapy Other Manual Therapy: MFR to R cervical, pterygoid, forehead region with STM following to decrease fascial restrictions; suboccipitial release 3x 2 min= 15'  Physical Therapy Assessment and Plan PT Assessment and Plan Clinical Impression Statement: Pt requires constant cueing to reduce forward head posture and tactile cueing to reduce UT activation.  Able to reduce fascial restrictions with improved cervical mobility, pt pain free at end of session. PT Plan: Continue to decrease pain and improve strength/posture per PT POC    Goals    Problem List Patient Active Problem List  Diagnosis  . PITUITARY ADENOMA  . HYPERPROLACTINEMIA  . OBESITY  . HYPERTENSION, ESSENTIAL  . SINUSITIS  . ALLERGIC RHINITIS  . GYNECOMASTIA  . GALACTORRHEA  . VULVOVAGINITIS  . DYSFUNCTIONAL UTERINE BLEEDING  . NECK PAIN  . FATIGUE  . Headache  . Bronchitis  . Fever blister    PT - End of Session Activity Tolerance: Patient tolerated treatment well General Behavior During Session: [facility] for tasks performed Cognition: [facility] for tasks performed  GP     Juel Burrow 07/08/2012, 9:50 AM

## 2012-07-12 ENCOUNTER — Ambulatory Visit (HOSPITAL_COMMUNITY)
Admission: RE | Admit: 2012-07-12 | Payer: 59 | Source: Ambulatory Visit | Attending: Family Medicine | Admitting: Family Medicine

## 2012-07-14 ENCOUNTER — Ambulatory Visit (HOSPITAL_COMMUNITY)
Admission: RE | Admit: 2012-07-14 | Discharge: 2012-07-14 | Disposition: A | Discharge status: Home / Self Care | Payer: 59 | Source: Ambulatory Visit | Attending: Family Medicine | Admitting: Family Medicine

## 2012-07-14 NOTE — Progress Notes (Signed)
Physical Therapy Treatment Patient Details  Name: Kaylee Montgomery MRN: [id number] Date of Birth: 1981-01-25  Today's Date: 07/14/2012 Time: 0815-0845 PT Time Calculation (min): 30 min  Visit#: 7  of 12   Re-eval: 07/24/12  Charge: therex 15', manual 15'  Subjective: Symptoms/Limitations Symptoms: Pt felt great following last session.  Pt 15 min late for apt today.  Pt stated pain scale 4/10 R UT, reported has been moving with work so increased pain.   Pain Assessment Currently in Pain?: Yes Pain Score:   4 Pain Location: Shoulder Pain Orientation: Right  Objective:   Exercise/Treatments Theraband Exercises Scapula Retraction: 15 reps;Green Shoulder Extension: 15 reps;Green Rows: 15 reps;Green Seated Exercises Neck Retraction: 10 reps;Limitations Neck Retraction Limitations: cueing for diaphragmatic breathing between each rep X to V: 10 reps W Back: 10 reps Money: 5 reps;5 secs    Physical Therapy Assessment and Plan PT Assessment and Plan Clinical Impression Statement: Pt with good mechanics with tband, pt given green tband and worksheet to progress exercises to HEP.  Pt continues to required constant cueing to reduce forward head posture and tactile cueing to reduce UT activation.  Added x to v exercise for posture strengthening, pt reported headache with activity, pt required cueing for diaphragmatic breathing with new activity, headache resolved following cues.  Good suboccipital release noted with manual, pt reported pain reduced at end of session. PT Plan: Continue to decrease pain and improve strength/posture per PT POC.  Add UBE backwards and prone activities for postural strengthening.    Goals    Problem List Patient Active Problem List  Diagnosis  . PITUITARY ADENOMA  . HYPERPROLACTINEMIA  . OBESITY  . HYPERTENSION, ESSENTIAL  . SINUSITIS  . ALLERGIC RHINITIS  . GYNECOMASTIA  . GALACTORRHEA  . VULVOVAGINITIS  . DYSFUNCTIONAL UTERINE BLEEDING  . NECK  PAIN  . FATIGUE  . Headache  . Bronchitis  . Fever blister    PT - End of Session Activity Tolerance: Patient tolerated treatment well General Behavior During Session: [facility] for tasks performed Cognition: [facility] for tasks performed  GP    Juel Burrow 07/14/2012, 10:37 AM

## 2012-07-16 ENCOUNTER — Ambulatory Visit (HOSPITAL_COMMUNITY): Payer: 59 | Admitting: *Deleted

## 2012-07-19 ENCOUNTER — Ambulatory Visit (HOSPITAL_COMMUNITY)
Admission: RE | Admit: 2012-07-19 | Discharge: 2012-07-19 | Disposition: A | Discharge status: Home / Self Care | Payer: 59 | Source: Ambulatory Visit | Attending: Family Medicine | Admitting: Family Medicine

## 2012-07-19 NOTE — Progress Notes (Signed)
Physical Therapy Treatment Patient Details  Name: Kaylee Montgomery MRN: [id number] Date of Birth: 1980-11-19  Today's Date: 07/19/2012 Time: 0816-0848 PT Time Calculation (min): 32 min  Visit#: 8  of 12   Re-eval: 07/23/12 Charges:  therex 15', manual 12'  Subjective: Symptoms/Limitations Symptoms: Pt. 15' late for appt. again today.  Pt. states she did well after last visit but had a rought day Saturday.  States she bought new pillows and slept on them Friday night so that was probably the source.  Currently without complaints of pain. Pain Assessment Currently in Pain?: No/denies   Exercise/Treatments Machines for Strengthening UBE (Upper Arm Bike): 4' backwards for posture Theraband Exercises Scapula Retraction: 15 reps;Green;Limitations Scapula Retraction Limitations: HEP Shoulder Extension: 15 reps;Green;Limitations Shoulder Extension Limitations: HEP Rows: 15 reps;Green;Limitations Rows Limitations: HEP Prone Exercises Neck Retraction: 10 reps W Back: 10 reps Shoulder Extension: 10 reps Rows: 10 reps Upper Extremity Flexion with Stabilization: 5 reps;Flexion;Limitations UE Flexion with Stabilization Limitations: alternating   Manual Therapy Massage: STM with some MFR techniques to B upper trap and subocciptial region; focus greater on R than L; Suboccipital release 3 reps x 2 min each= total 12'  Physical Therapy Assessment and Plan PT Assessment and Plan Clinical Impression Statement: Pt. able to perform tband exercises with good form and can now be discharged to HEP.  Added prone stab exercises with pt. displaying noted weakness in upper traps/cervical area.  Unable to complete seated exercises due to time (pt. was late for appt).  Less tightness noted in occipital region and no spasms found in upper trap region. PT Plan: continue to progress; decrease pain and improve strength and posture per PT POC.     Problem List Patient Active Problem List  Diagnosis  .  PITUITARY ADENOMA  . HYPERPROLACTINEMIA  . OBESITY  . HYPERTENSION, ESSENTIAL  . SINUSITIS  . ALLERGIC RHINITIS  . GYNECOMASTIA  . GALACTORRHEA  . VULVOVAGINITIS  . DYSFUNCTIONAL UTERINE BLEEDING  . NECK PAIN  . FATIGUE  . Headache  . Bronchitis  . Fever blister    PT - End of Session Activity Tolerance: Patient tolerated treatment well General Behavior During Session: [facility] for tasks performed Cognition: [facility] for tasks performed   Lurena Nida, PTA/CLT 07/19/2012, 8:52 AM

## 2012-07-21 ENCOUNTER — Ambulatory Visit (HOSPITAL_COMMUNITY)
Admission: RE | Admit: 2012-07-21 | Discharge: 2012-07-21 | Disposition: A | Discharge status: Home / Self Care | Payer: 59 | Source: Ambulatory Visit | Attending: Family Medicine | Admitting: Family Medicine

## 2012-07-21 DIAGNOSIS — M542 Cervicalgia: Secondary | ICD-10-CM | POA: Insufficient documentation

## 2012-07-21 DIAGNOSIS — R51 Headache: Secondary | ICD-10-CM | POA: Insufficient documentation

## 2012-07-21 DIAGNOSIS — IMO0001 Reserved for inherently not codable concepts without codable children: Secondary | ICD-10-CM | POA: Insufficient documentation

## 2012-07-21 DIAGNOSIS — M6281 Muscle weakness (generalized): Secondary | ICD-10-CM | POA: Insufficient documentation

## 2012-07-23 ENCOUNTER — Ambulatory Visit (HOSPITAL_COMMUNITY): Payer: 59 | Admitting: Physical Therapy

## 2012-07-27 ENCOUNTER — Ambulatory Visit (HOSPITAL_COMMUNITY): Payer: 59 | Admitting: Physical Therapy

## 2012-07-28 ENCOUNTER — Ambulatory Visit (HOSPITAL_COMMUNITY)
Admission: RE | Admit: 2012-07-28 | Discharge: 2012-07-28 | Disposition: A | Discharge status: Home / Self Care | Payer: 59 | Source: Ambulatory Visit | Attending: Family Medicine | Admitting: Family Medicine

## 2012-07-28 NOTE — Progress Notes (Signed)
Physical Therapy Re-evaluation  Patient Details  Name: Kaylee Montgomery MRN: [id number] Date of Birth: November 09, 1980  Today's Date: 07/28/2012 Time: 0815-0848 PT Time Calculation (min): 33 min  Visit#: 9  of 13   Re-eval: 08/11/12 Assessment Diagnosis: Cervical Pain Charge: mmt x 1 unit, self care 8', manual 10'  Subjective Symptoms/Limitations Symptoms: Pt 15 min late for appointment.  Pt stated pain scale 6/10 neck region today, reports no headache currently but did have one yesterday.  Pt believes her headaches are related to all the stress in her life. Pain Assessment Currently in Pain?: Yes Pain Score:   6 Pain Location: Neck Pain Orientation: Mid  Objective:   Cognition/Observation Observation/Other Assessments Observations: Improved posture does require cueing for posture when tired  Sensation/Coordination/Flexibility/Functional Tests Functional Tests Functional Tests: NDI: 34%  Assessment Cervical AROM Overall Cervical AROM Comments: WNL all directions without increased pain Cervical Strength Overall Cervical Strength Comments: Capital flexion: 4/5 (capital flexion was 3/5) Cervical Flexion:  (4+/5 was 4/5) Cervical Extension: 4/5 (was 4/5) Cervical - Right Side Bend:  (4+/5was 4/5) Cervical - Left Side Bend:  (4+/5was 4/5) Cervical - Right Rotation:  (4+/5was 3+/5) Cervical - Left Rotation:  (4+/5 was 3+/5)  Exercise/Treatments Machines for Strengthening UBE (Upper Arm Bike): 4' backwards for posture Seated Exercises Postural Training: Posture awareness x 5'  Physical Therapy Assessment and Plan PT Assessment and Plan Clinical Impression Statement: Re-eval complete.  Mrs. Monger has had 9 OPPT sessions over 4 weeks with the following findings:  Pt has met 2/4 STGs and 1/4 LTGs.  Pt independent with HEP 5x a week.  Pt reports neck pain average 4/10 with decreased sharp pain headaches to one a day (pt reports decreased 25%).  Pt presents with improved cervical  strength though continues to require cueing for posture awareness.  Pt reports she self cues herself for posture when thinks of it more frequently.  Pt with decreased neck pain disability index, scored 34%.  Palpated less fascial restrictions to occipital region, tightness noted in upper trap region, no spasms noted. Frequency: 2x/week Duration: 2 weeks PT Plan: Continue OPPT for 2 more weeks to improve cervical and postural strength and to reduce neck pain and decrease headache frequency.    Goals Home Exercise Program Pt will Perform Home Exercise Program: Independently PT Goal: Perform Home Exercise Program - Progress: Met PT Short Term Goals Time to Complete Short Term Goals: 3 weeks PT Short Term Goal 1: Pt will report pain less than 5/10 for 50% of her day with headaches decreased by 25% PT Short Term Goal 1 - Progress: Met (pain average 4/10, reports decreased headaches better AM/PM ) PT Short Term Goal 2: Pt will present with minimal muscular spasms PT Short Term Goal 2 - Progress: Progressing toward goal PT Short Term Goal 3: Pt will require min-mod cueing for proper posture during therapy regime.  PT Short Term Goal 3 - Progress: Progressing toward goal (still requires cues to reduce forward rolled shoulder/ head ) PT Short Term Goal 4: Pt will report decreased "sharp pain" to her head to less than 2x/day.  PT Short Term Goal 4 - Progress: Met (reports 1 a day) PT Long Term Goals Time to Complete Long Term Goals: Other (comment) (6 weeks) PT Long Term Goal 1: Pt will report a decrease in headaches by 50%. PT Long Term Goal 1 - Progress: Progressing toward goal (decreased 25%) PT Long Term Goal 2: Pt will improve cervical strength and postural strength to WNL  in order to indepedendently sit with appropriate posture for an entire therapy regime.  PT Long Term Goal 2 - Progress: Progressing toward goal Long Term Goal 3: Pt will improve her NDI to less than or equal to 40% for improved  percieved functional ability.  Long Term Goal 3 Progress: Met (NDI: 34%) Long Term Goal 4: Pt will present with fascial resctions to suboccipital and UT region in order to report no more than 2x/week "sharp pains"  to her head.  Long Term Goal 4 Progress: Progressing toward goal (decreased fascial restrictions to sub occ, UT spasms continu)  Problem List Patient Active Problem List  Diagnosis  . PITUITARY ADENOMA  . HYPERPROLACTINEMIA  . OBESITY  . HYPERTENSION, ESSENTIAL  . SINUSITIS  . ALLERGIC RHINITIS  . GYNECOMASTIA  . GALACTORRHEA  . VULVOVAGINITIS  . DYSFUNCTIONAL UTERINE BLEEDING  . NECK PAIN  . FATIGUE  . Headache  . Bronchitis  . Fever blister    PT - End of Session Activity Tolerance: Patient tolerated treatment well General Behavior During Session: [facility] for tasks performed Cognition: [facility] for tasks performed  GP    Juel Burrow 07/28/2012, 9:07 AM  Physician Documentation Your signature is required to indicate approval of the treatment plan as stated above.  Please sign and either send electronically or make a copy of this report for your files and return this physician signed original.   Please mark one 1.__approve of plan  2. ___approve of plan with the following conditions.   ______________________________                                                          _____________________ Physician Signature                                                                                                             Date

## 2012-08-03 ENCOUNTER — Ambulatory Visit (HOSPITAL_COMMUNITY)
Admission: RE | Admit: 2012-08-03 | Discharge: 2012-08-03 | Disposition: A | Discharge status: Home / Self Care | Payer: 59 | Source: Ambulatory Visit | Attending: Family Medicine | Admitting: Family Medicine

## 2012-08-03 NOTE — Progress Notes (Signed)
Physical Therapy Treatment Patient Details  Name: Kaylee Montgomery MRN: [id number] Date of Birth: 1981/05/18  Today's Date: 08/03/2012 Time: 7253-6644 PT Time Calculation (min): 32 min  Visit#: 10  of 13   Re-eval: 08/11/12  Charge: therex 15', manual 17'  Subjective: Symptoms/Limitations Symptoms: Pt late for appointment.  Pt stated pain scale 6/10 neck region and L shoulder today, reports 5 migraines at night in the last 5 days. Pain Assessment Currently in Pain?: Yes Pain Score:   6 Pain Location: Neck Pain Orientation: Posterior  Objective:   Exercise/Treatments Machines for Strengthening UBE (Upper Arm Bike): 4' backwards for posture Seated Exercises Neck Retraction: 10 reps;Limitations W Back: 10 reps Money: 10 reps Postural Training: Posture awareness x 5' Prone Exercises W Back: 10 reps Shoulder Extension: 10 reps Rows: 10 reps Other Prone Exercise: begin POE rotation next session.  Manual Therapy Massage: supine STM cervica region/upper trap with suboccipital release and manual traction x 17'  Physical Therapy Assessment and Plan PT Assessment and Plan Clinical Impression Statement: Pt continues to require cueing to improve posture, continues to show the forward head and forward rolled shoulders in between reps and at rest breaks.  Pt reported increased pain with transitional movements.  Pt did state pain decreased and no c/o migraines through session.  PT Plan: Begin POE with cervical rotation next session.  Continue to stress the importance of proper posture, continue cervical and postural strengthening, reduce pain and decrease headache frequency.    Goals    Problem List Patient Active Problem List  Diagnosis  . PITUITARY ADENOMA  . HYPERPROLACTINEMIA  . OBESITY  . HYPERTENSION, ESSENTIAL  . SINUSITIS  . ALLERGIC RHINITIS  . GYNECOMASTIA  . GALACTORRHEA  . VULVOVAGINITIS  . DYSFUNCTIONAL UTERINE BLEEDING  . NECK PAIN  . FATIGUE  . Headache    . Bronchitis  . Fever blister    PT - End of Session Activity Tolerance: Patient tolerated treatment well General Behavior During Session: [facility] for tasks performed Cognition: [facility] for tasks performed  GP    Juel Burrow 08/03/2012, 9:39 AM

## 2012-08-04 ENCOUNTER — Ambulatory Visit (HOSPITAL_COMMUNITY)
Admission: RE | Admit: 2012-08-04 | Discharge: 2012-08-04 | Disposition: A | Discharge status: Home / Self Care | Payer: 59 | Source: Ambulatory Visit | Attending: Family Medicine | Admitting: Family Medicine

## 2012-08-04 NOTE — Progress Notes (Signed)
Physical Therapy Treatment Patient Details  Name: Kaylee Montgomery MRN: [id number] Date of Birth: 08-30-1981  Today's Date: 08/04/2012 Time: 0810-0850 PT Time Calculation (min): 40 min  Visit#: 11  of 13   Re-eval: 08/11/12 Diagnosis: Cervical Pain Next MD Visit: 08/18/13 Charges:  therex 14', MHP with traction X 1 unit each  Subjective: Symptoms/Limitations Symptoms: Pt. 10' late for appt. today.  States her pain is up to 8/10.  Discussed trying traction and pt. is agreeable.   Exercise/Treatments Machines for Strengthening UBE (Upper Arm Bike): 4' backwards for posture Theraband Exercises Scapula Retraction: 15 reps;Green;Limitations Scapula Retraction Limitations: HEP Shoulder Extension: 15 reps;Green;Limitations Shoulder Extension Limitations: HEP Rows: 15 reps;Green;Limitations Rows Limitations: HEP Seated Exercises Neck Retraction: 10 reps W Back: 10 reps Money: 10 reps Prone Exercises W Back: Limitations W Back Limitations: held today Shoulder Extension: Limitations Shoulder Extension Limitations: held today Rows: Limitations Rows Limitations: held today Other Prone Exercise: POE rotation not added today; add next visit if pain decreased   Modalities Modalities: Moist Heat;Traction Moist Heat Therapy Number Minutes Moist Heat: 15 Minutes Moist Heat Location:  (cervical with traction) Traction Type of Traction: Cervical Min (lbs): n/a Max (lbs): 16 Hold Time: static, 4 levels up Rest Time: n/a Time: 31'  Physical Therapy Assessment and Plan PT Assessment and Plan Clinical Impression Statement: Pt. continues to require max multimodal cues with therex due to poor form and posture. Constant forward head and shoulders despite cues (only able to hold for short periods). Pt. with high pain level today, trial of mechanical traction with MHP to see if decreases symptoms/pain. Reported 0/10 immediately following session, however reported soreness to touch in L  cervical area. PT Plan: Begin POE with cervical rotation next session and resume prone exercises if pain decreased.  Continue to stress the importance of proper posture. Assess results of traction next visit.     Problem List Patient Active Problem List  Diagnosis  . PITUITARY ADENOMA  . HYPERPROLACTINEMIA  . OBESITY  . HYPERTENSION, ESSENTIAL  . SINUSITIS  . ALLERGIC RHINITIS  . GYNECOMASTIA  . GALACTORRHEA  . VULVOVAGINITIS  . DYSFUNCTIONAL UTERINE BLEEDING  . NECK PAIN  . FATIGUE  . Headache  . Bronchitis  . Fever blister    PT - End of Session Activity Tolerance: Patient tolerated treatment well General Behavior During Session: [facility] for tasks performed Cognition: [facility] for tasks performed   Lurena Nida, PTA/CLT 08/04/2012, 9:02 AM

## 2012-08-06 ENCOUNTER — Inpatient Hospital Stay (HOSPITAL_COMMUNITY): Admission: RE | Admit: 2012-08-06 | Payer: 59 | Source: Ambulatory Visit | Admitting: *Deleted

## 2012-08-10 ENCOUNTER — Ambulatory Visit (HOSPITAL_COMMUNITY)
Admission: RE | Admit: 2012-08-10 | Discharge: 2012-08-10 | Disposition: A | Discharge status: Home / Self Care | Payer: 59 | Source: Ambulatory Visit | Attending: Family Medicine | Admitting: Family Medicine

## 2012-08-10 NOTE — Progress Notes (Signed)
Physical Therapy Treatment Patient Details  Name: Kaylee Montgomery MRN: [id number] Date of Birth: 1981-05-05  Today's Date: 08/10/2012 Time: 4098-1191 PT Time Calculation (min): 47 min Charges: 15' TE, 15' Manual, 1 traction, 1 heat  Visit#: 12  of 13   Re-eval: 08/11/12   Subjective: Symptoms/Limitations Symptoms: Pt 15' late for apt today.  Pt stated pain free at entrance, did report 1 headache yesterday night. Pain Assessment Currently in Pain?: No/denies  Precautions/Restrictions     Exercise/Treatments Machines for Strengthening UBE (Upper Arm Bike): 6' backwards for posture Prone Exercises Neck Retraction: 10 reps;5 secs;Limitations Neck Retraction Limitations: chin tuck head lift Shoulder Extension: 15 reps Rows: 15 reps Other Prone Exercise: POE rotation 5x 10", PNF D1 5x 10"  Modalities Modalities: Moist Heat;Traction Manual Therapy Manual Therapy: Myofascial release Myofascial Release: suboccipital release to decrease headaches and decrease fascial restrictions with STM to follow.  Moist Heat Therapy Number Minutes Moist Heat: 17 Minutes Moist Heat Location: Other (comment) (cervical with traction) Traction Type of Traction: Cervical Min (lbs): n/a Max (lbs): 17 Hold Time: static, 4 levels up Rest Time: n/a Time: 17'  Physical Therapy Assessment and Plan PT Assessment and Plan Clinical Impression Statement: Added prone exercises to improve cervical and shoulder strength to improve posture.  Pt continues to demonstrate weakness and fatigue with exercises.  Added to HEP to continue to gain strength.  Overall her intensity of headaches has decreased.  PT Plan: Cont x2 visits and then re-eval.  Add POE exercises and shoulder strengthening next visit.     Goals    Problem List Patient Active Problem List  Diagnosis  . PITUITARY ADENOMA  . HYPERPROLACTINEMIA  . OBESITY  . HYPERTENSION, ESSENTIAL  . SINUSITIS  . ALLERGIC RHINITIS  . GYNECOMASTIA  .  GALACTORRHEA  . VULVOVAGINITIS  . DYSFUNCTIONAL UTERINE BLEEDING  . NECK PAIN  . FATIGUE  . Headache  . Bronchitis  . Fever blister    PT - End of Session Activity Tolerance: Patient tolerated treatment well General Behavior During Session: [facility] for tasks performed Cognition: [facility] for tasks performed  Isel Skufca, PT 08/10/2012, 8:49 AM

## 2012-08-12 ENCOUNTER — Inpatient Hospital Stay (HOSPITAL_COMMUNITY): Admission: RE | Admit: 2012-08-12 | Payer: 59 | Source: Ambulatory Visit

## 2012-08-13 ENCOUNTER — Ambulatory Visit (HOSPITAL_COMMUNITY)
Admission: RE | Admit: 2012-08-13 | Discharge: 2012-08-13 | Disposition: A | Discharge status: Home / Self Care | Payer: 59 | Source: Ambulatory Visit | Attending: Family Medicine | Admitting: Family Medicine

## 2012-08-13 DIAGNOSIS — I10 Essential (primary) hypertension: Secondary | ICD-10-CM | POA: Insufficient documentation

## 2012-08-13 DIAGNOSIS — IMO0001 Reserved for inherently not codable concepts without codable children: Secondary | ICD-10-CM | POA: Insufficient documentation

## 2012-08-13 DIAGNOSIS — M6281 Muscle weakness (generalized): Secondary | ICD-10-CM | POA: Insufficient documentation

## 2012-08-13 DIAGNOSIS — M542 Cervicalgia: Secondary | ICD-10-CM | POA: Insufficient documentation

## 2012-08-13 NOTE — Progress Notes (Signed)
Physical Therapy Treatment Patient Details  Name: Kaylee Montgomery MRN: [id number] Date of Birth: 1981/09/24  Today's Date: 08/13/2012 Time: 0815-0905 PT Time Calculation (min): 50 min  Visit#: 13  of 13   Re-eval: 08/11/12 Assessment Diagnosis: Cervical Pain Next MD Visit: 08/18/12 Charge: Manual 23', MMT 1 unit, ROM Measurement x 1 unit, MHP x , therex x 10'   Subjective: Symptoms/Limitations Symptoms: Pt 15' late for apt today.  Stated yesterday she was really sick and hurting all over.  Pain today posterior cervical region 5/10.  Pt stated she has improved 65-70% overall with therapy.  Objective:   08/13/12 0800  Cervical AROM  Overall Cervical AROM Comments WNL all directions w/o pain except for R SB  Cervical Strength  Overall Cervical Strength Comments Capital flexion 4+/5 (Capital flexion 4/5)  Cervical Flexion 4/5 (was 4/5)  Cervical Extension 4/5 (was 4/5)  Cervical - Right Side Bend 4/5 (was 4/5)  Cervical - Left Side Bend 4/5 (was 4/5)  Cervical - Right Rotation 4/5 (was 3+/5)  Cervical - Left Rotation 4/5 (was 3+/5)   Exercise/Treatments Machines for Strengthening UBE (Upper Arm Bike): 6' backwards for posture Seated Exercises Neck Retraction: 10 reps;5 secs Supine Exercises Neck Retraction: 10 reps;5 secs  Modalities Modalities: Moist Heat Manual Therapy Manual Therapy: Myofascial release Myofascial Release: suboccipital release to decrease headaches and decrease fascial restrictions Other Manual Therapy: Manual cervical traction Moist Heat Therapy Number Minutes Moist Heat: 10 Minutes Moist Heat Location:  (cervical)  Physical Therapy Assessment and Plan PT Assessment and Plan Clinical Impression Statement: Reassessment complete for [name] with the following findings:  pt has met 3/4 STGs and 3/4 LTGs.  Pt reported pain less than 5/10 with average pain scale 3/10.  Pt stated headaches have decreased by 50%, no longer has sharp pain but does  complain of piercing headahces one time a day.  Palapated minimal spasms and decreased fascial restrictions in suboccipitial and upper trap region.  Cervical ROM WNL without pain except for discomfort with R sidebending, improved strength with 4/5 overall for all motions, pt continues to require min cueing for posture to decrease forward head and forward rolled shoulders.  NDI 34% for improved perceived  functional abilities.  Pt believes she can continue cueing herself for posture, has been given theraband for postural strengthening and HEP worksheet for prone exercises to continue at home. PT Plan: D/C to HEP.    Goals Home Exercise Program: Independently: Met (once a day) Goals: 3 weeks Goal 1: Pt will report pain less than 5/10 for 50% of her day with headaches decreased by 25%:  Met Goal 2: Pt will present with minimal muscular spasms: Met Goal 3: Pt will require min-mod cueing for proper posture during therapy regime.: Progressing toward goal (min cueing still require to reduce forward head, rolled shoulders) Goal 4: Pt will report decreased "sharp pain" to her head to less than 2x/day.: Met (piercing headaches 1x a day) PT Long Term Goals 6 weeks  Goal 1: Pt will report a decrease in headaches by 50%.: Met Goal 2: Pt will improve cervical strength and postural strength to WNL in order to indepedendently sit with appropriate posture for an entire therapy regime. : Progressing toward goal (strength overall 4/5, still requires cueing for posture ) Goal 3: Pt will improve her NDI to less than or equal to 40% for improved percieved functional ability. : Met (NDI: 34%) Goal 4: Pt will present with fascial resctions to suboccipital and UT region in  order to report no more than 2x/week "sharp pains"  to her head. Met (decreased restrictions/spasms no more sharp pain headaches)  Problem List Patient Active Problem List  Diagnosis  . PITUITARY ADENOMA  . HYPERPROLACTINEMIA  . OBESITY  .  HYPERTENSION, ESSENTIAL  . SINUSITIS  . ALLERGIC RHINITIS  . GYNECOMASTIA  . GALACTORRHEA  . VULVOVAGINITIS  . DYSFUNCTIONAL UTERINE BLEEDING  . NECK PAIN  . FATIGUE  . Headache  . Bronchitis  . Fever blister    PT - End of Session Activity Tolerance: Patient tolerated treatment well General Behavior During Session: [facility] for tasks performed Cognition: [facility] for tasks performed  Juel Burrow 08/13/2012, 9:23 AM

## 2012-08-18 ENCOUNTER — Encounter: Payer: 59 | Admitting: Family Medicine

## 2012-08-27 ENCOUNTER — Telehealth: Payer: Self-pay | Admitting: Family Medicine

## 2012-08-27 ENCOUNTER — Other Ambulatory Visit: Payer: Self-pay

## 2012-08-27 DIAGNOSIS — M542 Cervicalgia: Secondary | ICD-10-CM

## 2012-08-27 MED ORDER — CYCLOBENZAPRINE HCL 10 MG PO TABS: 10.0000 mg | ORAL_TABLET | Freq: Three times a day (TID) | ORAL | Status: DC | PRN

## 2012-08-27 NOTE — Telephone Encounter (Signed)
Med refilled.

## 2012-11-05 ENCOUNTER — Telehealth: Payer: Self-pay | Admitting: Family Medicine

## 2012-11-05 NOTE — Telephone Encounter (Signed)
Advice given is sufficient and correct. If she worsens with fever , chills , green drainage etsc will need Ov for evaluation . Please let her know

## 2012-11-05 NOTE — Telephone Encounter (Signed)
Called patient and left message for them to return call at the office   

## 2012-11-05 NOTE — Telephone Encounter (Signed)
Pt aware.

## 2012-11-05 NOTE — Telephone Encounter (Signed)
Yellowish sinus drainage, sneezing, watery eyes Tried sudafed and cold and flu meds.  Advised that she can try claritin and saline spray. Wants to know if she can get something prescription also [facility]

## 2012-11-10 ENCOUNTER — Other Ambulatory Visit: Payer: Self-pay

## 2012-11-10 ENCOUNTER — Other Ambulatory Visit: Payer: Self-pay | Admitting: Family Medicine

## 2012-11-10 MED ORDER — TRIAMTERENE-HCTZ 37.5-25 MG PO TABS: ORAL_TABLET | ORAL | Status: DC

## 2012-12-22 ENCOUNTER — Encounter: Payer: Self-pay | Admitting: Family Medicine

## 2012-12-22 ENCOUNTER — Ambulatory Visit (INDEPENDENT_AMBULATORY_CARE_PROVIDER_SITE_OTHER): Payer: 59 | Admitting: Family Medicine

## 2012-12-22 VITALS — BP 124/86 | HR 100 | Resp 18 | Ht 65.5 in | Wt 238.0 lb

## 2012-12-22 DIAGNOSIS — J309 Allergic rhinitis, unspecified: Secondary | ICD-10-CM

## 2012-12-22 DIAGNOSIS — Z13220 Encounter for screening for lipoid disorders: Secondary | ICD-10-CM

## 2012-12-22 DIAGNOSIS — R51 Headache: Secondary | ICD-10-CM

## 2012-12-22 DIAGNOSIS — R7301 Impaired fasting glucose: Secondary | ICD-10-CM

## 2012-12-22 DIAGNOSIS — I10 Essential (primary) hypertension: Secondary | ICD-10-CM

## 2012-12-22 DIAGNOSIS — E229 Hyperfunction of pituitary gland, unspecified: Secondary | ICD-10-CM

## 2012-12-22 DIAGNOSIS — J329 Chronic sinusitis, unspecified: Secondary | ICD-10-CM

## 2012-12-22 DIAGNOSIS — E669 Obesity, unspecified: Secondary | ICD-10-CM

## 2012-12-22 MED ORDER — CETIRIZINE HCL 10 MG PO TBDP: 1.0000 | ORAL_TABLET | Freq: Every day | ORAL | Status: DC

## 2012-12-22 MED ORDER — FLUTICASONE PROPIONATE 50 MCG/ACT NA SUSP: 2.0000 | Freq: Every day | NASAL | Status: DC

## 2012-12-22 MED ORDER — SULFAMETHOXAZOLE-TRIMETHOPRIM 800-160 MG PO TABS: 1.0000 | ORAL_TABLET | Freq: Two times a day (BID) | ORAL | Status: AC

## 2012-12-22 MED ORDER — FLUCONAZOLE 150 MG PO TABS: ORAL_TABLET | ORAL | Status: DC

## 2012-12-22 MED ORDER — PREDNISONE 5 MG PO TABS: 5.0000 mg | ORAL_TABLET | Freq: Two times a day (BID) | ORAL | Status: DC

## 2012-12-22 NOTE — Assessment & Plan Note (Signed)
Uncontrolled, re educated re daily use of allergy medications

## 2012-12-22 NOTE — Patient Instructions (Addendum)
CPE in 4 month  You are being treated for chronic sinusitis. You NEED toake the septra (antibiotic) for 3 weeks total CALL in 3 weeks if no better, you will be referred for scan of your sinsues.  DAILY meds for allergies, flonase, and zyrtec. Saline nasal flushes twice daily. You will get directtions as to how to make the solution to flush your nostrils. OK to use sudafed one daily for 3 to 5 days when there is excessive drainage. Prednisone is prescribed for 5 days   Fasting labs past due you need them, HBa1C, lipid, chem 7 and TSh and VItD please get the week of 01/03/2013  It is important that you exercise regularly at least 30 minutes 5 times a week. If you develop chest pain, have severe difficulty breathing, or feel very tired, stop exercising immediately and seek medical attention   A healthy diet is rich in fruit, vegetables and whole grains. Poultry fish, nuts and beans are a healthy choice for protein rather then red meat. A low sodium diet and drinking 64 ounces of water daily is generally recommended. Oils and sweet should be limited. Carbohydrates especially for those who are diabetic or overweight, should be limited to 30-45 gram per meal. It is important to eat on a regular schedule, at least 3 times daily. Snacks should be primarily fruits, vegetables or nuts.

## 2012-12-22 NOTE — Assessment & Plan Note (Signed)
Sub optimal control, no med change DASH diet and commitment to daily physical activity for a minimum of 30 minutes discussed and encouraged, as a part of hypertension management. The importance of attaining a healthy weight is also discussed.  

## 2012-12-22 NOTE — Assessment & Plan Note (Signed)
Improved interms of frequency and severity, pt to continue with headache clinic

## 2012-12-22 NOTE — Assessment & Plan Note (Signed)
Deteriorated. Patient re-educated about  the importance of commitment to a  minimum of 150 minutes of exercise per week. The importance of healthy food choices with portion control discussed. Encouraged to start a food diary, count calories and to consider  joining a support group. Sample diet sheets offered. Goals set by the patient for the next several months.    

## 2012-12-22 NOTE — Assessment & Plan Note (Signed)
3 month h/o sinus pressure and drainage, pt to take a 3 week course of antibiotics, if still symptomatic, then she will have ct sinus Aggressive daily treatment of uncontrolled allergies, flonase and oral meds as well as saline nasal washes, and sudafed as needed for exccess drainage

## 2012-12-22 NOTE — Progress Notes (Signed)
  Subjective:    Patient ID: Kaylee Montgomery, female    DOB: Nov 23, 1980, 32 y.o.   MRN: [id number]  HPI The PT is here for follow up and re-evaluation of chronic medical conditions, medication management and review of any available recent lab and radiology data.  Preventive health is updated, specifically  Cancer screening and Immunization.   Questions or concerns regarding consultations or procedures which the PT has had in the interim are  Addressed.She is being followed by headache clinic and reports less frequent headaches on current regime. Also states she will have breast reduction surgery in the Fall C/o 3 month h/o constant post nasal drainage, always feels as though she is choking and has started losing her voice, no throat pain. Drainage at times is thick and yellow. No documented fever or significant chills. Denies ear pain, at times coughs up yellow sputum      Review of Systems See HPI . Denies chest pains, palpitations and leg swelling Denies abdominal pain, nausea, vomiting,diarrhea or constipation.   Denies dysuria, frequency, hesitancy or incontinence. Denies joint pain, swelling and limitation in mobility. Denies headache, seizures, numbness, or tingling. Denies depression, anxiety or insomnia. Denies skin break down or rash.        Objective:   Physical Exam Patient alert and oriented and in no cardiopulmonary distress.  HEENT: No facial asymmetry, EOMI, frontal and maxillary  sinus tenderness,  oropharynx pink and moist.  Neck supple anterior cervical  Adenopathy.Eryhtema and edema of nasal mucosa. TM clear  Chest: Clear to auscultation bilaterally.  CVS: S1, S2 no murmurs, no S3.  ABD: Soft non tender. Bowel sounds normal.  Ext: No edema  MS: Adequate ROM spine, shoulders, hips and knees.  Skin: Intact, no ulcerations or rash noted.  Psych: Good eye contact, normal affect. Memory intact not anxious or depressed appearing.  CNS: CN 2-12 intact,  power, tone and sensation normal throughout.        Assessment & Plan:

## 2013-02-24 ENCOUNTER — Telehealth: Payer: Self-pay | Admitting: Family Medicine

## 2013-02-24 ENCOUNTER — Ambulatory Visit (INDEPENDENT_AMBULATORY_CARE_PROVIDER_SITE_OTHER): Payer: Self-pay | Admitting: Nurse Practitioner

## 2013-02-24 ENCOUNTER — Telehealth: Payer: Self-pay | Admitting: *Deleted

## 2013-02-24 ENCOUNTER — Encounter: Payer: Self-pay | Admitting: Nurse Practitioner

## 2013-02-24 VITALS — BP 128/83 | HR 99 | Ht 65.5 in | Wt 242.0 lb

## 2013-02-24 DIAGNOSIS — R51 Headache: Secondary | ICD-10-CM

## 2013-02-24 DIAGNOSIS — M542 Cervicalgia: Secondary | ICD-10-CM

## 2013-02-24 MED ORDER — TOPIRAMATE 25 MG PO TABS: 25.0000 mg | ORAL_TABLET | Freq: Every day | ORAL | Status: DC

## 2013-02-24 NOTE — Telephone Encounter (Signed)
I NOTICED PT IS SCHEDULED WITH DR. Anne Hahn FOR NEXT FOLLOW UP

## 2013-02-24 NOTE — Progress Notes (Signed)
HPI: Patient returns for followup after last visit 09/10/2012. She has a history of headaches which are mainly occipital in nature and bitemporal they can be sharp at times. She also has a different type of headache that starts in the neck and moves and she thinks that is due to her shoulder discomfort.  She also has allergies and a recent bout of sinusitis.  The patient has not had any focal weakness, she has not had trouble controlling her bowels or bladder. She has a prolactin secreting pituitary adenoma and  her MRIs are followed every 2-3 years by Dr. Lodema Hong. Her last MRI was in 2012 and was without change from previous in 2009. She has been unable to tolerate medications secondary to her adenoma. She is currently on nortriptyline taking 50 mg at night but still has several days a week where she has headaches. She is also noted to snore and wakes up with headaches. I have discussed having sleep study at her last visit however she wanted to hold off. She is now ready to have that done. Previous ESS score was 17, BMI is 39.66, weight 242.  ROS:  - weight gain, snoring, increasted thrist, aching muscles, runny nose, not enough sleep, decreased energy, insomnia, headache,itching  Physical Exam General: well developed, obese female, seated, in no evident distress Head: head normocephalic and atraumatic. Oropharynx benign Neck: supple with no carotid or supraclavicular bruits Cardiovascular: regular rate and rhythm, no murmurs  Neurologic Exam Mental Status: Awake and fully alert. Mood and affect appropriate. Cooperative during exam Cranial Nerves: Fundoscopic exam reveals sharp disc margins. Pupils equal, briskly reactive to light. Extraocular movements full without nystagmus. Visual fields full to confrontation. Hearing intact and symmetric to finger snap. Facial sensation intact. Face, tongue, palate move normally and symmetrically. Neck flexion and extension normal.  Motor: Normal bulk and tone.  Normal strength in all tested extremity muscles. No focal weakness Sensory.: intact to touch and pinprick and vibratory.  Coordination: Rapid alternating movements normal in all extremities. Finger-to-nose and heel-to-shin performed accurately bilaterally. Gait and Station: Arises from chair without difficulty. Stance is normal. Gait demonstrates normal stride length and balance . Able to heel, toe and tandem walk without difficulty.  Reflexes: 1+ and symmetric. Toes downgoing.     ASSESSMENT: Chronic  headache Obesity, previous ESS score 17, weight 242, BMI 39.66. 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   Nilda Riggs, GNP-BC APRN

## 2013-02-24 NOTE — Telephone Encounter (Signed)
Is an order on file ? Would you please add the general neurologist at [facility] - carolyn is  seeing this patient for.  PCP Simpson.

## 2013-02-24 NOTE — Telephone Encounter (Signed)
This patient qualifies for a sleep consult , and based on HIGH Epworth score  May still qualify for in lab testing if narcolepsy is in the differential.

## 2013-02-24 NOTE — Progress Notes (Signed)
I have read the note, and I agree with the clinical assessment and plan.  

## 2013-02-24 NOTE — Telephone Encounter (Signed)
MD SLEEP REVIEW for 32 year old female Kaylee Montgomery  REFERRING MD:  Darrol Angel, NP PCP:  Syliva Overman, MD INSURANCE COVERAGE: [employer] HEIGHT:  5'5"  WEIGHT:  242 lb BMI:  39.64  SLEEP SYMPTOMS:  Excessive daytime sleepiness, Epworth is 17/24, snoring, decreased energy, insomnia,   MEDICAL HISTORY:  Hypertension, Morbid obesity with recent 20 pound weight gain, headaches, allergic rhinitis, prolactin secreting pituitary adenoma, chronic sinusitis, Pt has had tonsillectomy,  MEDICATIONS:   Topiramate 25 mg titrated weekly to 100 mg Nortriptyline 50 mg  Coverage information:  Per [employer] POLICY [employer] will likely approve a HST for this patient based upon her BMI and medical history, less likely to approve an in lab study without documented suspicion of complex sleep disorder such as Narcolepsy, Parasomnia, PLMD or significant comorbid condition that would degrade HST results.  Comprehensive Sleep Consult would be required prior to scheduling HST.    NOTES:  Sleep study previously discussed with patient at her last visit but pt chose to wait, she is now ready to go ahead and pursue sleep testing.

## 2013-02-24 NOTE — Patient Instructions (Addendum)
Begin topiramate 25 mg daily for one week and follow directions on the bottle for titration Will schedule a sleep study Please send in your headache diary to my attention Call in 2 months and will begin decreasing nortriptyline.

## 2013-02-25 ENCOUNTER — Telehealth: Payer: Self-pay | Admitting: *Deleted

## 2013-02-25 MED ORDER — TRIAMTERENE-HCTZ 37.5-25 MG PO TABS: ORAL_TABLET | ORAL | Status: DC

## 2013-02-25 NOTE — Telephone Encounter (Signed)
Sent in

## 2013-02-25 NOTE — Telephone Encounter (Signed)
No answer, no voicemail - will have to try again later. -sh

## 2013-02-26 ENCOUNTER — Telehealth: Payer: Self-pay | Admitting: *Deleted

## 2013-02-26 NOTE — Telephone Encounter (Signed)
I called and left a message for the patient to callback to the office to schedulea sleep consult with Dr. Vickey Huger.

## 2013-02-28 NOTE — Telephone Encounter (Signed)
This patient qualifies for attended sleep study based on clinical history.  Patient will need a sPLIT at AHI 10 and  3% scoring. [employer] patient .   Offer lunesta prn,unless she has a sleep aid she can bring herself. EDS is very high.   BMI is high ,  CO2 necessary.   Please schedule a ST for apnea screening. Sleep consult should not be needed if a patient was seen in office and has good documentation.   CM/   dr Anne Hahn   Addresses  this first. CD

## 2013-03-01 NOTE — Telephone Encounter (Signed)
LM for pt on mobile asked her to call us at sleep lab, explained need to ask a couple of questions (re: differential dx for other sleep disorder such as: Cataplexy, hypnogogic hallucinations, sleep attacks, vivid dreams, limb movements during sleep, unwanted behaviors during sleep) - depending on how pt answers these questions, will schedule HST appt or sleep consult. -S. Cerise Lieber, RPSGT

## 2013-03-08 ENCOUNTER — Other Ambulatory Visit: Payer: Self-pay | Admitting: *Deleted

## 2013-03-08 DIAGNOSIS — G4710 Hypersomnia, unspecified: Secondary | ICD-10-CM

## 2013-03-16 ENCOUNTER — Ambulatory Visit (INDEPENDENT_AMBULATORY_CARE_PROVIDER_SITE_OTHER): Payer: 59 | Admitting: Neurology

## 2013-03-16 VITALS — BP 137/97 | HR 113 | Ht 64.0 in | Wt 240.0 lb

## 2013-03-16 DIAGNOSIS — G4710 Hypersomnia, unspecified: Secondary | ICD-10-CM

## 2013-03-16 DIAGNOSIS — G4733 Obstructive sleep apnea (adult) (pediatric): Secondary | ICD-10-CM

## 2013-03-17 ENCOUNTER — Telehealth: Payer: Self-pay | Admitting: Family Medicine

## 2013-03-17 NOTE — Telephone Encounter (Signed)
pls refill x 2 and let her know 

## 2013-03-17 NOTE — Telephone Encounter (Signed)
Is it ok to refill nystatin powder?

## 2013-03-18 ENCOUNTER — Other Ambulatory Visit: Payer: Self-pay | Admitting: Family Medicine

## 2013-03-18 NOTE — Telephone Encounter (Signed)
Patient aware.

## 2013-03-18 NOTE — Telephone Encounter (Signed)
Med refilled.

## 2013-04-05 ENCOUNTER — Encounter: Payer: Self-pay | Admitting: *Deleted

## 2013-04-05 ENCOUNTER — Telehealth: Payer: Self-pay | Admitting: *Deleted

## 2013-04-05 DIAGNOSIS — G4733 Obstructive sleep apnea (adult) (pediatric): Secondary | ICD-10-CM

## 2013-04-05 NOTE — Telephone Encounter (Signed)
Left message for patient regarding sleep study results, asked patient to call me back to discuss results and have questions answered.  Explained that a copy of the sleep study was sent to referring physician and copy of study is coming to them in the mail.  Briefly explained Dr. Vickey Huger had requested the pt start CPAP therapy at home and orders would be sent to [facility] who would contact the patient within the next 7 days. -sh

## 2013-04-05 NOTE — Progress Notes (Signed)
See media tab for full report  

## 2013-04-07 ENCOUNTER — Other Ambulatory Visit: Payer: Self-pay | Admitting: Neurology

## 2013-04-26 ENCOUNTER — Ambulatory Visit (INDEPENDENT_AMBULATORY_CARE_PROVIDER_SITE_OTHER): Payer: 59 | Admitting: Family Medicine

## 2013-04-26 ENCOUNTER — Encounter: Payer: Self-pay | Admitting: Family Medicine

## 2013-04-26 VITALS — BP 110/78 | HR 110 | Temp 99.2°F | Resp 16 | Ht 65.5 in | Wt 238.8 lb

## 2013-04-26 DIAGNOSIS — R7309 Other abnormal glucose: Secondary | ICD-10-CM

## 2013-04-26 DIAGNOSIS — R51 Headache: Secondary | ICD-10-CM

## 2013-04-26 DIAGNOSIS — I10 Essential (primary) hypertension: Secondary | ICD-10-CM

## 2013-04-26 DIAGNOSIS — N61 Mastitis without abscess: Secondary | ICD-10-CM | POA: Insufficient documentation

## 2013-04-26 DIAGNOSIS — E669 Obesity, unspecified: Secondary | ICD-10-CM

## 2013-04-26 DIAGNOSIS — R5381 Other malaise: Secondary | ICD-10-CM

## 2013-04-26 DIAGNOSIS — R5383 Other fatigue: Secondary | ICD-10-CM

## 2013-04-26 DIAGNOSIS — J42 Unspecified chronic bronchitis: Secondary | ICD-10-CM | POA: Insufficient documentation

## 2013-04-26 DIAGNOSIS — R7303 Prediabetes: Secondary | ICD-10-CM

## 2013-04-26 DIAGNOSIS — R7301 Impaired fasting glucose: Secondary | ICD-10-CM

## 2013-04-26 DIAGNOSIS — J329 Chronic sinusitis, unspecified: Secondary | ICD-10-CM

## 2013-04-26 LAB — CBC WITH DIFFERENTIAL/PLATELET
Basophils Absolute: 0 10*3/uL (ref 0.0–0.1)
Basophils Relative: 0 % (ref 0–1)
Eosinophils Absolute: 0.1 10*3/uL (ref 0.0–0.7)
Eosinophils Relative: 1 % (ref 0–5)
HCT: 37.8 % (ref 36.0–46.0)
Hemoglobin: 12.9 g/dL (ref 12.0–15.0)
Lymphocytes Relative: 35 % (ref 12–46)
Lymphs Abs: 3.2 10*3/uL (ref 0.7–4.0)
MCH: 25.5 pg — ABNORMAL LOW (ref 26.0–34.0)
MCHC: 34.1 g/dL (ref 30.0–36.0)
MCV: 74.9 fL — ABNORMAL LOW (ref 78.0–100.0)
Monocytes Absolute: 0.9 10*3/uL (ref 0.1–1.0)
Monocytes Relative: 10 % (ref 3–12)
Neutro Abs: 4.8 10*3/uL (ref 1.7–7.7)
Neutrophils Relative %: 54 % (ref 43–77)
Platelets: 504 10*3/uL — ABNORMAL HIGH (ref 150–400)
RBC: 5.05 MIL/uL (ref 3.87–5.11)
RDW: 13.6 % (ref 11.5–15.5)
WBC: 9 10*3/uL (ref 4.0–10.5)

## 2013-04-26 LAB — BASIC METABOLIC PANEL
BUN: 7 mg/dL (ref 6–23)
CO2: 27 mEq/L (ref 19–32)
Calcium: 9.8 mg/dL (ref 8.4–10.5)
Chloride: 100 mEq/L (ref 96–112)
Creat: 0.76 mg/dL (ref 0.50–1.10)
Glucose, Bld: 104 mg/dL — ABNORMAL HIGH (ref 70–99)
Potassium: 3.1 mEq/L — ABNORMAL LOW (ref 3.5–5.3)
Sodium: 137 mEq/L (ref 135–145)

## 2013-04-26 MED ORDER — CEFTRIAXONE SODIUM 1 G IJ SOLR
500.0000 mg | Freq: Once | INTRAMUSCULAR | Status: AC
  Administered 2013-04-26: 500 mg via INTRAMUSCULAR

## 2013-04-26 MED ORDER — PREDNISONE 10 MG PO TABS: 10.0000 mg | ORAL_TABLET | Freq: Two times a day (BID) | ORAL | Status: AC

## 2013-04-26 MED ORDER — BENZONATATE 100 MG PO CAPS: 100.0000 mg | ORAL_CAPSULE | Freq: Four times a day (QID) | ORAL | Status: DC | PRN

## 2013-04-26 MED ORDER — FLUCONAZOLE 150 MG PO TABS: ORAL_TABLET | ORAL | Status: AC

## 2013-04-26 MED ORDER — DOXYCYCLINE HYCLATE 100 MG PO TABS: 100.0000 mg | ORAL_TABLET | Freq: Two times a day (BID) | ORAL | Status: AC

## 2013-04-26 NOTE — Progress Notes (Signed)
  Subjective:    Patient ID: Kaylee Montgomery, female    DOB: Jun 01, 1981, 32 y.o.   MRN: [id number]  HPI Boil on left breast x 3 days, seems to be getting moore red, no drainage, or fever, max dia approx 4 inches Coughing excessively with sinus drainage and pressure, x 3 weeks, since past 3 weeks, not much relief with prednisone, was treated in urgent care where she was also prescribed a z pack which she is taking Having difficulty speaking due to excessive cough, back and head hurt , intermittent chills  Review of Systems See HPI Denies chest pains, palpitations and leg swelling Denies abdominal pain, nausea, vomiting,diarrhea or constipation.   Denies dysuria, frequency, hesitancy or incontinence. Denies joint pain, swelling and limitation in mobility. Denies  seizures, numbness, or tingling. Denies depression, anxiety or insomnia.         Objective:   Physical Exam  Patient alert and oriented and in no cardiopulmonary distress.  HEENT: No facial asymmetry, EOMI, frontal and maxillary  sinus tenderness,  oropharynx pink and moist.  Neck supple no adenopathy.TM mildly erythematous bilaterally  Chest: Adequate air entry, scattered crackles, few wheezes  CVS: S1, S2 no murmurs, no S3.  ABD: Soft non tender. Bowel sounds normal.  Ext: No edema  MS: Adequate ROM spine, shoulders, hips and knees.  Skin: Intact, no ulcerations or rash noted.  Psych: Good eye contact, normal affect. Memory intact not anxious or depressed appearing.  CNS: CN 2-12 intact, power, tone and sensation normal throughout.       Assessment & Plan:

## 2013-04-26 NOTE — Patient Instructions (Addendum)
Pelvic and breast in 3.5 month, call if you need me before.  Rocephin 500mg  Im today for infection of lest breast followed by doxycycline antibiotic.Do NOT squeeze the area. If the area increases in size or pain or redness, call, you will be referred to a surgeon .  For recurrent/chronic sinusitis and bronchitis, you are referred for scan of sinuses, doxycycline is prescribed for 3 weeks, also 5 day course of  Prednisone and decongestant  to help with breathing. Take sudafed one daily for the next 3 to 7 days to help reduce nasal drainage please  Please get CXr and labs , today   Labs, CBC and diff, chem 7, TSH, HBA1C  I believe that you need re evaluation by allergist, since head congestion is a recurrent uncontrolled problem for you

## 2013-04-27 LAB — HEMOGLOBIN A1C
Hgb A1c MFr Bld: 6.6 % — ABNORMAL HIGH (ref <5.7)
Mean Plasma Glucose: 143 mg/dL — ABNORMAL HIGH (ref <117)

## 2013-04-27 LAB — TSH: TSH: 1 u[IU]/mL (ref 0.350–4.500)

## 2013-04-29 ENCOUNTER — Telehealth: Payer: Self-pay

## 2013-04-29 ENCOUNTER — Other Ambulatory Visit: Payer: Self-pay | Admitting: Family Medicine

## 2013-04-29 MED ORDER — PROMETHAZINE-DM 6.25-15 MG/5ML PO SYRP: ORAL_SOLUTION | ORAL | Status: AC

## 2013-04-29 MED ORDER — POTASSIUM CHLORIDE CRYS ER 20 MEQ PO TBCR: 20.0000 meq | EXTENDED_RELEASE_TABLET | Freq: Two times a day (BID) | ORAL | Status: DC

## 2013-04-29 NOTE — Telephone Encounter (Signed)
Still coughing, almost constantly now, producing white/yellow phlegm. Meds not helping at all. Feels like something is wrong other than allergies or a cold  Cell- [phone].

## 2013-04-29 NOTE — Telephone Encounter (Signed)
Spoke with pt, phenergan DM sent in, she is to use the albuterol inhaler which was prescribed the last visit every 6 to 8 hours, and also to get her CXR done

## 2013-04-30 DIAGNOSIS — R7303 Prediabetes: Secondary | ICD-10-CM | POA: Insufficient documentation

## 2013-04-30 NOTE — Assessment & Plan Note (Signed)
Mildly inflamed left breast left lower quadrant with possible infected cyst, aggressive antibiotic course, pt to call if no improvement

## 2013-04-30 NOTE — Assessment & Plan Note (Signed)
Acute symptoms, excessive cough , sputum , production is difficult, wheeze at times  CXR and decongestants, oral prednisone and cough suppressant

## 2013-04-30 NOTE — Assessment & Plan Note (Signed)
Deteriorated. Patient re-educated about  the importance of commitment to a  minimum of 150 minutes of exercise per week. The importance of healthy food choices with portion control discussed. Encouraged to start a food diary, count calories and to consider  joining a support group. Sample diet sheets offered. Goals set by the patient for the next several months.    

## 2013-04-30 NOTE — Assessment & Plan Note (Signed)
Improved being treated by neurology

## 2013-04-30 NOTE — Assessment & Plan Note (Signed)
Patient educated about the importance of limiting  Carbohydrate intake , the need to commit to daily physical activity for a minimum of 30 minutes , and to commit weight loss. The fact that changes in all these areas will reduce or eliminate all together the development of diabetes is stressed.    

## 2013-04-30 NOTE — Assessment & Plan Note (Signed)
Treated wit 3 weeks antibiotics, less than 4 months ago , now wit recurrent symptoms, though she states she "had recovered fully" i am suspicious for possibility of chronic sinus infection untreated, will equest sinus scan. Also aggressive antibioiotic course

## 2013-04-30 NOTE — Assessment & Plan Note (Signed)
Controlled, no change in medication DASH diet and commitment to daily physical activity for a minimum of 30 minutes discussed and encouraged, as a part of hypertension management. The importance of attaining a healthy weight is also discussed.  

## 2013-05-26 ENCOUNTER — Ambulatory Visit (INDEPENDENT_AMBULATORY_CARE_PROVIDER_SITE_OTHER): Payer: 59 | Admitting: Family Medicine

## 2013-05-26 ENCOUNTER — Encounter: Payer: Self-pay | Admitting: Family Medicine

## 2013-05-26 VITALS — BP 106/78 | HR 100 | Resp 18 | Ht 65.5 in | Wt 236.1 lb

## 2013-05-26 DIAGNOSIS — R7303 Prediabetes: Secondary | ICD-10-CM

## 2013-05-26 DIAGNOSIS — J309 Allergic rhinitis, unspecified: Secondary | ICD-10-CM

## 2013-05-26 DIAGNOSIS — I10 Essential (primary) hypertension: Secondary | ICD-10-CM

## 2013-05-26 DIAGNOSIS — N61 Mastitis without abscess: Secondary | ICD-10-CM

## 2013-05-26 DIAGNOSIS — R7309 Other abnormal glucose: Secondary | ICD-10-CM

## 2013-05-26 DIAGNOSIS — J329 Chronic sinusitis, unspecified: Secondary | ICD-10-CM

## 2013-05-26 DIAGNOSIS — E669 Obesity, unspecified: Secondary | ICD-10-CM

## 2013-05-26 MED ORDER — METFORMIN HCL 500 MG PO TABS: 500.0000 mg | ORAL_TABLET | Freq: Two times a day (BID) | ORAL | Status: DC

## 2013-05-26 NOTE — Progress Notes (Signed)
  Subjective:    Patient ID: Kaylee Montgomery, female    DOB: 05-15-1981, 32 y.o.   MRN: [id number]  HPI  Pt in primarily to discuss her new dx of possible diabetes, and the fact that her HBA1C is now over the limit of prediabetes. She has been on multiple courses of prednisone in recent times, and I believe this is palying a large part in the problem Cough has improved, Still her head feels as though she is "in a barrell" needs sinus scan She has been independently trying to help herself, ahs started swimming lessons, and also signed up for kick boxing, checked on diabetic ed classes on line, and is happy to be able to have individual time in the office ,a s well as to learn of local support plans to use this Skin infection on breast cleared rapidly with antibiotics  Review of Systems See HPI Denies recent fever or chills. Chronic sinus congestion and nasal congestion unchanged, denies sore throat or ear pain Denies chest congestion, productive cough or wheezing.Still has intermittent dry cough mainly experienced as a post nasal drainage Denies chest pains, palpitations and leg swelling Denies abdominal pain, nausea, vomiting,diarrhea or constipation.   Denies dysuria, frequency, hesitancy or incontinence. Denies joint pain, swelling and limitation in mobility. Denies headaches, seizures, numbness, or tingling. Denies depression, anxiety or insomnia. Denies skin break down or rash.        Objective:   Physical Exam  Patient alert and oriented and in no cardiopulmonary distress.  HEENT: No facial asymmetry, EOMI, maxillary and frontal sinus tenderness,moist.  Neck supple no adenopathy.Nasal mucosa erythematous and edematous  Chest: Clear to auscultation bilaterally.  CVS: S1, S2 no murmurs, no S3.  ABD: Soft non tender. Bowel sounds normal.  Ext: No edema  MS: Adequate ROM spine, shoulders, hips and knees.  Skin: Intact, no ulcerations or rash noted.  Psych: Good eye  contact, normal affect. Memory intact not anxious or depressed appearing.  CNS: CN 2-12 intact, power, tone and sensation normal throughout.       Assessment & Plan:

## 2013-05-26 NOTE — Patient Instructions (Addendum)
F/ub October 11 or after  HBA1C , fasting lipid, chem 7 , October 8 or after  New med for help with blood sugar is metformin 1 twice daily  GREAT that you have committed to fun exercise, and changing food choices, you really HAVE to do this to improve your health

## 2013-05-29 NOTE — Assessment & Plan Note (Signed)
counselled in house by nurse as well as myself  Also referred for individual counseling will also start metformin

## 2013-05-29 NOTE — Assessment & Plan Note (Signed)
Resolved following antibiotic course 

## 2013-05-29 NOTE — Assessment & Plan Note (Signed)
May need immunotherapy will see what sinus films look like

## 2013-05-29 NOTE — Assessment & Plan Note (Signed)
Controlled, no change in medication DASH diet and commitment to daily physical activity for a minimum of 30 minutes discussed and encouraged, as a part of hypertension management. The importance of attaining a healthy weight is also discussed.  

## 2013-05-29 NOTE — Assessment & Plan Note (Signed)
Ongoing sinus pressure and head congestion despite recent multiple antibiotic courses, needs scan

## 2013-05-29 NOTE — Assessment & Plan Note (Signed)
Improved. Pt applauded on succesful weight loss through lifestyle change, and encouraged to continue same. Weight loss goal set for the next several months.  

## 2013-06-02 ENCOUNTER — Telehealth (HOSPITAL_COMMUNITY): Payer: Self-pay | Admitting: Dietician

## 2013-06-02 ENCOUNTER — Encounter (HOSPITAL_COMMUNITY): Payer: Self-pay | Admitting: Dietician

## 2013-06-02 NOTE — Progress Notes (Signed)
[facility] Diabetes Class Completion  Date:June 02, 2013  Time: 1730  Pt attended [facility] Education Class on June 02, 2013.   Patient was educated on the following topics:   -Survival skills (signs and symptoms of hyperglycemia and hypoglycemia, treatment for hypoglycemia, ideal levels for fasting and postprandial blood sugars, goal Hgb A1c level, foot care basics)  -Recommendations for physical activity   -Carbohydrate metabolism in relation to diabetes   -Meal planning (sources of carbohydrate, carbohydrate counting, meal planning strategies, food label reading, and portion control).  Handouts provided:  -"Diabetes and You: Taking Charge of Your Health"  -"Carbohydrate Counting and Meal Planning"  -"Blood Sugar Diary"  -"Your Guide to Better Office Visits"   Ade Stmarie A. Nelia Rogoff, RD, LDN   

## 2013-06-02 NOTE — Telephone Encounter (Signed)
Received call from pt at 1445. Registered for DM class this evening at 1730.

## 2013-06-03 ENCOUNTER — Telehealth: Payer: Self-pay | Admitting: Family Medicine

## 2013-06-03 NOTE — Telephone Encounter (Signed)
Patient is aware 

## 2013-06-07 ENCOUNTER — Other Ambulatory Visit: Payer: Self-pay | Admitting: Family Medicine

## 2013-06-07 ENCOUNTER — Ambulatory Visit (HOSPITAL_COMMUNITY)
Admission: RE | Admit: 2013-06-07 | Discharge: 2013-06-07 | Disposition: A | Discharge status: Home / Self Care | Payer: 59 | Source: Ambulatory Visit | Attending: Family Medicine | Admitting: Family Medicine

## 2013-06-07 DIAGNOSIS — Z889 Allergy status to unspecified drugs, medicaments and biological substances status: Secondary | ICD-10-CM

## 2013-06-07 DIAGNOSIS — J3489 Other specified disorders of nose and nasal sinuses: Secondary | ICD-10-CM | POA: Insufficient documentation

## 2013-06-07 DIAGNOSIS — J329 Chronic sinusitis, unspecified: Secondary | ICD-10-CM | POA: Insufficient documentation

## 2013-08-04 ENCOUNTER — Encounter: Payer: Self-pay | Admitting: Internal Medicine

## 2013-08-04 ENCOUNTER — Ambulatory Visit (INDEPENDENT_AMBULATORY_CARE_PROVIDER_SITE_OTHER): Payer: 59 | Admitting: Internal Medicine

## 2013-08-04 ENCOUNTER — Other Ambulatory Visit: Payer: 59

## 2013-08-04 VITALS — BP 122/68 | HR 105 | Ht 65.0 in | Wt 230.0 lb

## 2013-08-04 DIAGNOSIS — R51 Headache: Secondary | ICD-10-CM

## 2013-08-04 DIAGNOSIS — J302 Other seasonal allergic rhinitis: Secondary | ICD-10-CM

## 2013-08-04 DIAGNOSIS — J309 Allergic rhinitis, unspecified: Secondary | ICD-10-CM

## 2013-08-04 DIAGNOSIS — J3089 Other allergic rhinitis: Secondary | ICD-10-CM

## 2013-08-04 MED ORDER — BECLOMETHASONE DIPROPIONATE 80 MCG/ACT NA AERS: 2.0000 | INHALATION_SPRAY | Freq: Every day | NASAL | Status: DC

## 2013-08-04 NOTE — Patient Instructions (Addendum)
Order- lab- Allergy profile  Sample Qnasl nasal spray   1-2 puffs each nostril once daily at bedtime

## 2013-08-04 NOTE — Progress Notes (Signed)
08/04/13 31 yoF never smoker referred by Dr Lodema Hong  Asks allergy evaluation because of frequent headaches affected by weather and environment. For 5 years she reports postnasal drip which is no longer seasonal. Sneezing, nasal congestion, itching of eyes. Made worse by indoor heat, seasonal pollens , grass being mowed, strong fragrances. denies cough or wheeze. No benefit from nasal sprays or antihistamines. Allergy skin tested years ago without allergy vaccine. Past surgery for tonsillectomy. A surgical complication required a second procedure for repair. Not diagnosed with asthma.  Prior to Admission medications   Medication Sig Start Date End Date Taking? Authorizing Provider  Cetirizine HCl 10 MG TBDP Take 1 tablet by mouth daily. 12/22/12  Yes Kerri Perches, MD  diphenhydrAMINE (BENADRYL) 25 MG tablet Take 25 mg by mouth at bedtime.   Yes Historical Provider, MD  fluticasone (FLONASE) 50 MCG/ACT nasal spray Place 2 sprays into the nose as needed. 12/22/12 12/22/13 Yes Kerri Perches, MD  ibuprofen (ADVIL,MOTRIN) 800 MG tablet TAKE 1 TABLET BY MOUTH EVERY 8 HOURS AS NEEDED FOR PAIN. 05/06/12  Yes Kerri Perches, MD  metFORMIN (GLUCOPHAGE) 500 MG tablet Take 1 tablet (500 mg total) by mouth 2 (two) times daily with a meal. 05/26/13 09/25/13 Yes Kerri Perches, MD  Multiple Vitamin (MULTIVITAMIN) tablet Take 1 tablet by mouth daily.   Yes Historical Provider, MD  nystatin (MYCOSTATIN/NYSTOP) 100000 UNIT/GM POWD Apply twice daily to rash under breast when needed  03/18/13  Yes Kerri Perches, MD  topiramate (TOPAMAX) 25 MG tablet Take 1 tablet (25 mg total) by mouth daily. 25mg  daily for 1 week then increase by 25mg  weekly until dose is 100mg . 02/24/13  Yes Nilda Riggs, NP  triamterene-hydrochlorothiazide (MAXZIDE-25) 37.5-25 MG per tablet TAKE 1 TABLET BY MOUTH ONCE A DAY 02/25/13  Yes Kerri Perches, MD  Beclomethasone Dipropionate (QNASL) 80 MCG/ACT AERS Place 2 puffs into the  nose at bedtime. 08/04/13   Waymon Budge, MD   Past Medical History  Diagnosis Date  . Pituitary adenoma   . Gynecomastia   . ALLERGIC RHINITIS   . Sinusitis   . Obesity   . Headache(784.0)   . Hypertension   . Sleep apnea    Past Surgical History  Procedure Laterality Date  . Caesarean    . Tonsillectomy     Family History  Problem Relation Age of Onset  . Diabetes Maternal Grandfather   . Heart disease Father     stent   History   Social History  . Marital Status: Single    Spouse Name: N/A    Number of Children: 1  . Years of Education: N/A   Occupational History  . STUDENT    . CASEWORKER    Social History Main Topics  . Smoking status: Never Smoker   . Smokeless tobacco: Never Used  . Alcohol Use: Yes     Comment: occasionally-wine or liquor  . Drug Use: No  . Sexual Activity: Not on file   Other Topics Concern  . Not on file   Social History Narrative  . No narrative on file   ROS-see HPI Constitutional:   No-   weight loss, night sweats, fevers, chills, fatigue, lassitude. HEENT:   + headaches, No-difficulty swallowing, tooth/dental problems, sore throat,       +sneezing, itching, ear ache, +nasal congestion, +post nasal drip,  CV:  No-   chest pain, orthopnea, PND, swelling in lower extremities, anasarca, dizziness, palpitations Resp: No-  shortness of breath with exertion or at rest.              No-   productive cough,  No non-productive cough,  No- coughing up of blood.              No-   change in color of mucus.  No- wheezing.   Skin: No-   rash or lesions. GI:  No-   heartburn, indigestion, abdominal pain, nausea, vomiting, diarrhea,                 change in bowel habits, loss of appetite GU: No-   dysuria, change in color of urine, no urgency or frequency.  No- flank pain. MS:  No-   joint pain or swelling.  No- decreased range of motion.  No- back pain. Neuro-     nothing unusual Psych:  No- change in mood or affect. No depression  or anxiety.  No memory loss.  OBJ- Physical Exam General- Alert, Oriented, Affect-appropriate, Distress- none acute.  obese Skin- rash-none, lesions- none, excoriation- none. +dry Lymphadenopathy- none Head- atraumatic            Eyes- Gross vision intact, PERRLA, conjunctivae and secretions clear            Ears- Hearing, canals-normal            Nose- Clear, no-Septal dev, mucus, polyps, erosion, perforation             Throat- Mallampati III-IV , mucosa clear , drainage- none, tonsils- atrophic Neck- flexible , trachea midline, no stridor , thyroid nl, carotid no bruit Chest - symmetrical excursion , unlabored           Heart/CV- RRR , no murmur , no gallop  , no rub, nl s1 s2                           - JVD- none , edema- none, stasis changes- none, varices- none           Lung- clear to P&A, wheeze- none, cough- none , dullness-none, rub- none           Chest wall-  Abd- tender-no, distended-no, bowel sounds-present, HSM- no Br/ Gen/ Rectal- Not done, not indicated Extrem- cyanosis- none, clubbing, none, atrophy- none, strength- nl Neuro- grossly intact to observation

## 2013-08-05 LAB — ALLERGY FULL PROFILE
Allergen, D pternoyssinus,d7: <0.1 kU/L
Allergen,Goose feathers, e70: <0.1 kU/L
Alternaria Alternata: <0.1 kU/L
Aspergillus fumigatus, m3: <0.1 kU/L
Bahia Grass: <0.1 kU/L
Bermuda Grass: <0.1 kU/L
Box Elder IgE: <0.1 kU/L
Candida Albicans: <0.1 kU/L
Cat Dander: <0.1 kU/L
Common Ragweed: <0.1 kU/L
Curvularia lunata: <0.1 kU/L
D. farinae: <0.1 kU/L
Dog Dander: <0.1 kU/L
Elm IgE: <0.1 kU/L
Fescue: <0.1 kU/L
G005 Rye, Perennial: <0.1 kU/L
G009 Red Top: <0.1 kU/L
Goldenrod: <0.1 kU/L
Helminthosporium halodes: <0.1 kU/L
House Dust Hollister: <0.1 kU/L
IgE (Immunoglobulin E), Serum: 26.7 IU/mL (ref 0.0–180.0)
Lamb's Quarters: <0.1 kU/L
Oak: <0.1 kU/L
Plantain: <0.1 kU/L
Stemphylium Botryosum: <0.1 kU/L
Sycamore Tree: <0.1 kU/L
Timothy Grass: <0.1 kU/L

## 2013-08-16 ENCOUNTER — Telehealth: Payer: Self-pay | Admitting: Internal Medicine

## 2013-08-16 NOTE — Telephone Encounter (Signed)
Allergy profile- no elevated allergy antibody levels against the common allergy triggers on the panel          Pt aware of results and aware that results are released to MyChart and will send me a message stating she got them and no further questions or concerns.

## 2013-08-16 NOTE — Progress Notes (Signed)
Quick Note:  Pt aware of results. ______ 

## 2013-08-19 NOTE — Assessment & Plan Note (Signed)
Her headaches may not be related to either her history of adenoma, or of rhinitis

## 2013-08-19 NOTE — Assessment & Plan Note (Signed)
Allergic or vasomotor rhinitis. We discussed possible reflux causing a throat sensation mistaken for reflux Plan- allergy profile,  If positive we will schedule skin testing, try Qnasl, knowing that she strongly dislikes nasal sprays. She wants to be on allergy vaccine but we will discuss treatment considerations as we have lab results.

## 2013-08-25 ENCOUNTER — Encounter: Payer: 59 | Admitting: Family Medicine

## 2013-09-27 ENCOUNTER — Ambulatory Visit (INDEPENDENT_AMBULATORY_CARE_PROVIDER_SITE_OTHER): Payer: 59 | Admitting: Internal Medicine

## 2013-09-27 ENCOUNTER — Encounter: Payer: Self-pay | Admitting: Internal Medicine

## 2013-09-27 VITALS — BP 124/64 | HR 90 | Ht 65.0 in | Wt 234.4 lb

## 2013-09-27 DIAGNOSIS — J309 Allergic rhinitis, unspecified: Secondary | ICD-10-CM

## 2013-09-27 DIAGNOSIS — J302 Other seasonal allergic rhinitis: Secondary | ICD-10-CM

## 2013-09-27 DIAGNOSIS — J3089 Other allergic rhinitis: Secondary | ICD-10-CM

## 2013-09-27 DIAGNOSIS — G4733 Obstructive sleep apnea (adult) (pediatric): Secondary | ICD-10-CM

## 2013-09-27 MED ORDER — BECLOMETHASONE DIPROPIONATE 80 MCG/ACT NA AERS: 2.0000 | INHALATION_SPRAY | Freq: Every day | NASAL | Status: DC

## 2013-09-27 MED ORDER — AZELASTINE-FLUTICASONE 137-50 MCG/ACT NA SUSP: 1.0000 | Freq: Every day | NASAL | Status: DC

## 2013-09-27 NOTE — Patient Instructions (Signed)
Sample Dymista nasal spray     1-2 puffs each nostril once daily at bedtime  Script to get Qnasl if you decide you like it better than Dymista  Try otc decongestant for stuffiness    Like Sudafed-PE   Once each morning to relieve stuffiness  Schedule allergy skin testing    Antihistamines like Dymista and like allergy, cold and sinus products, some cough syrups and sleep medicines, will block allergy testing. Please- no antihistamines for 3 days before skin testing. Call and ask to talk with my nurse if you aren't sure what you take is ok.

## 2013-09-27 NOTE — Progress Notes (Signed)
08/04/13 31 yoF never smoker referred by Dr Lodema Hong  Asks allergy evaluation because of frequent headaches affected by weather and environment. For 5 years she reports postnasal drip which is no longer seasonal. Sneezing, nasal congestion, itching of eyes. Made worse by indoor heat, seasonal pollens , grass being mowed, strong fragrances. denies cough or wheeze. No benefit from nasal sprays or antihistamines. Allergy skin tested years ago without allergy vaccine. Past surgery for tonsillectomy. A surgical complication required a second procedure for repair. Not diagnosed with asthma.  09/27/13- 32 yoF never smoker followed for allergic rhinitis, complicated by OSA(Neurology manages) FOLLOWS FOR: continues to have flare ups; review labs in detail with patient. Sneezing and some retro-orbital/nasal pressure. Like Qnasl better- no drainage. Says sleep study was positive for sleep apnea by [facility]. Neurology is managing.  ROS-see HPI Constitutional:   No-   weight loss, night sweats, fevers, chills, fatigue, lassitude. HEENT:   + headaches, No-difficulty swallowing, tooth/dental problems, sore throat,       +sneezing, itching, ear ache, +nasal congestion, +post nasal drip,  CV:  No-   chest pain, orthopnea, PND, swelling in lower extremities, anasarca, dizziness, palpitations Resp: No-   shortness of breath with exertion or at rest.              No-   productive cough,  No non-productive cough,  No- coughing up of blood.              No-   change in color of mucus.  No- wheezing.   Skin: No-   rash or lesions. GI:  No-   heartburn, indigestion, abdominal pain, nausea, vomiting,  GU:  MS:  No-   joint pain or swelling.   Neuro-     nothing unusual Psych:  No- change in mood or affect. No depression or anxiety.  No memory loss.  OBJ- Physical Exam General- Alert, Oriented, Affect-appropriate, Distress- none acute.  obese Skin- rash-none, lesions- none, excoriation- none.  +dry Lymphadenopathy- none Head- atraumatic            Eyes- Gross vision intact, PERRLA, conjunctivae and secretions clear            Ears- Hearing, canals-normal            Nose- Clear, no-Septal dev, mucus, polyps, erosion, perforation             Throat- Mallampati III-IV , mucosa clear , drainage- none, tonsils- atrophic Neck- flexible , trachea midline, no stridor , thyroid nl, carotid no bruit Chest - symmetrical excursion , unlabored           Heart/CV- RRR , no murmur , no gallop  , no rub, nl s1 s2                           - JVD- none , edema- none, stasis changes- none, varices- none           Lung- clear to P&A, wheeze- none, cough- none , dullness-none, rub- none           Chest wall-  Abd-  Br/ Gen/ Rectal- Not done, not indicated Extrem- cyanosis- none, clubbing, none, atrophy- none, strength- nl Neuro- grossly intact to observation

## 2013-09-29 ENCOUNTER — Encounter: Payer: 59 | Admitting: Family Medicine

## 2013-10-05 ENCOUNTER — Other Ambulatory Visit: Payer: Self-pay | Admitting: Family Medicine

## 2013-10-18 ENCOUNTER — Telehealth: Payer: Self-pay

## 2013-10-18 MED ORDER — PREDNISONE (PAK) 5 MG PO TABS: 5.0000 mg | ORAL_TABLET | ORAL | Status: DC

## 2013-10-18 NOTE — Telephone Encounter (Addendum)
States has not worked in 2 days symptoms started 4 days, states has headache periorbital and over nose  And headache and shoulders , rates it at  A 9,no sputum, chills, fever, will send in pred dose pack, she is to contact me on Thursday if not better for Friday work in, go to urgent care if deteriorates  Needs labs that were ordered in August faxed to the lab she should get them within the next week pls send

## 2013-10-19 DIAGNOSIS — G4733 Obstructive sleep apnea (adult) (pediatric): Secondary | ICD-10-CM | POA: Insufficient documentation

## 2013-10-19 NOTE — Assessment & Plan Note (Signed)
Plan refill Qnasl, but compares sample Dymista nasal spray. Schedule allergy skin testing.

## 2013-10-19 NOTE — Telephone Encounter (Signed)
Noted and lab order sent

## 2013-10-21 ENCOUNTER — Emergency Department (HOSPITAL_COMMUNITY)
Admission: EM | Admit: 2013-10-21 | Discharge: 2013-10-21 | Disposition: A | Discharge status: Home / Self Care | Payer: 59 | Attending: Emergency Medicine | Admitting: Emergency Medicine

## 2013-10-21 ENCOUNTER — Encounter (HOSPITAL_COMMUNITY): Payer: Self-pay | Admitting: Emergency Medicine

## 2013-10-21 DIAGNOSIS — J329 Chronic sinusitis, unspecified: Secondary | ICD-10-CM

## 2013-10-21 DIAGNOSIS — Z3202 Encounter for pregnancy test, result negative: Secondary | ICD-10-CM | POA: Insufficient documentation

## 2013-10-21 DIAGNOSIS — R51 Headache: Secondary | ICD-10-CM | POA: Insufficient documentation

## 2013-10-21 DIAGNOSIS — R42 Dizziness and giddiness: Secondary | ICD-10-CM

## 2013-10-21 DIAGNOSIS — Z79899 Other long term (current) drug therapy: Secondary | ICD-10-CM | POA: Insufficient documentation

## 2013-10-21 DIAGNOSIS — Z8742 Personal history of other diseases of the female genital tract: Secondary | ICD-10-CM | POA: Insufficient documentation

## 2013-10-21 DIAGNOSIS — N390 Urinary tract infection, site not specified: Secondary | ICD-10-CM

## 2013-10-21 DIAGNOSIS — E669 Obesity, unspecified: Secondary | ICD-10-CM | POA: Insufficient documentation

## 2013-10-21 DIAGNOSIS — Z8669 Personal history of other diseases of the nervous system and sense organs: Secondary | ICD-10-CM | POA: Insufficient documentation

## 2013-10-21 DIAGNOSIS — I10 Essential (primary) hypertension: Secondary | ICD-10-CM | POA: Insufficient documentation

## 2013-10-21 DIAGNOSIS — R519 Headache, unspecified: Secondary | ICD-10-CM

## 2013-10-21 LAB — URINALYSIS, ROUTINE W REFLEX MICROSCOPIC
Bilirubin Urine: NEGATIVE
Glucose, UA: NEGATIVE mg/dL
Ketones, ur: NEGATIVE mg/dL
Nitrite: NEGATIVE
Protein, ur: NEGATIVE mg/dL
Specific Gravity, Urine: <1.005 — ABNORMAL LOW (ref 1.005–1.030)
Urobilinogen, UA: 0.2 mg/dL (ref 0.0–1.0)
pH: 6 (ref 5.0–8.0)

## 2013-10-21 LAB — URINE MICROSCOPIC-ADD ON

## 2013-10-21 LAB — PREGNANCY, URINE: Preg Test, Ur: NEGATIVE

## 2013-10-21 MED ORDER — NAPROXEN 500 MG PO TABS: 500.0000 mg | ORAL_TABLET | Freq: Two times a day (BID) | ORAL | Status: DC

## 2013-10-21 MED ORDER — CEPHALEXIN 500 MG PO CAPS: 500.0000 mg | ORAL_CAPSULE | Freq: Four times a day (QID) | ORAL | Status: DC

## 2013-10-21 MED ORDER — AZITHROMYCIN 250 MG PO TABS
500.0000 mg | ORAL_TABLET | Freq: Once | ORAL | Status: AC
  Administered 2013-10-21: 500 mg via ORAL

## 2013-10-21 MED ORDER — KETOROLAC TROMETHAMINE 60 MG/2ML IM SOLN
60.0000 mg | Freq: Once | INTRAMUSCULAR | Status: AC
  Administered 2013-10-21: 60 mg via INTRAMUSCULAR

## 2013-10-21 NOTE — ED Notes (Signed)
Patient states she thought she had a sinus infection and her MD gave her medicine, but she feels like she is not getting any better.  Patient c/o headache.  Denies N/V/D.

## 2013-10-21 NOTE — Discharge Instructions (Signed)
Keflex 4 times daily - naprosyn twice daily, return for worsening symptoms.  Please call your doctor for a followup appointment within 24-48 hours. When you talk to your doctor please let them know that you were seen in the emergency department and have them acquire all of your records so that they can discuss the findings with you and formulate a treatment plan to fully care for your new and ongoing problems.

## 2013-10-21 NOTE — ED Provider Notes (Signed)
CSN: [id number]     Arrival date & time 10/21/13  0043 History   First MD Initiated Contact with Patient 10/21/13 0113     Chief Complaint  Patient presents with  . Dizziness   (Consider location/radiation/quality/duration/timing/severity/associated sxs/prior Treatment) HPI Comments: 33 y/o female - dx of recent sinusitis - on prednisone X 2 days - states it is not helping.  CC of headaches, shortness of breath and dizziness tonight when she tried to take a shower.  He states that her symptoms were gradual in onset several days ago, and persistent, her dizziness was new this evening and stated that when she tried to stand up to take a shower she became lightheaded and felt as though she was going to pass out. This resolved with sitting down. She has been taking prednisone for the last 36 hours which she states is not helping her symptoms very much.  She has no vertigo, no CP, no n/v.  Has hx of Migraines - states that the pain that she has in the back of her head is located at the top of the neck and is tender to the touch  The history is provided by the patient.    Past Medical History  Diagnosis Date  . Pituitary adenoma   . Gynecomastia   . ALLERGIC RHINITIS   . Sinusitis   . Obesity   . Headache(784.0)   . Hypertension   . Sleep apnea    Past Surgical History  Procedure Laterality Date  . Caesarean    . Tonsillectomy     Family History  Problem Relation Age of Onset  . Diabetes Maternal Grandfather   . Heart disease Father     stent   History  Substance Use Topics  . Smoking status: Never Smoker   . Smokeless tobacco: Never Used  . Alcohol Use: Yes     Comment: occasionally-wine or liquor   OB History   Grav Para Term Preterm Abortions TAB SAB Ect Mult Living                 Review of Systems  All other systems reviewed and are negative.    Allergies  Bromocriptine mesylate  Home Medications   Current Outpatient Rx  Name  Route  Sig  Dispense  Refill  .  Azelastine-Fluticasone (DYMISTA) 137-50 MCG/ACT SUSP   Nasal   Place 1-2 puffs into the nose at bedtime.   1 Bottle   0   . Beclomethasone Dipropionate (QNASL) 80 MCG/ACT AERS   Nasal   Place 2 puffs into the nose at bedtime.   4.2 g   prn   . Cetirizine HCl 10 MG TBDP   Oral   Take 1 tablet by mouth daily.   30 tablet   5   . diphenhydrAMINE (BENADRYL) 25 MG tablet   Oral   Take 25 mg by mouth at bedtime.         . fluticasone (FLONASE) 50 MCG/ACT nasal spray   Nasal   Place 2 sprays into the nose as needed.         Marland Kitchen ibuprofen (ADVIL,MOTRIN) 800 MG tablet      TAKE 1 TABLET BY MOUTH EVERY 8 HOURS AS NEEDED FOR PAIN.   30 tablet   3   . nystatin (MYCOSTATIN/NYSTOP) 100000 UNIT/GM POWD      Apply twice daily to rash under breast when needed    60 g   2   . predniSONE (STERAPRED UNI-PAK) 5  MG TABS tablet   Oral   Take 1 tablet (5 mg total) by mouth as directed.   21 tablet   0   . topiramate (TOPAMAX) 25 MG tablet   Oral   Take 1 tablet (25 mg total) by mouth daily. 25mg  daily for 1 week then increase by 25mg  weekly until dose is 100mg .   120 tablet   6   . triamterene-hydrochlorothiazide (MAXZIDE-25) 37.5-25 MG per tablet      TAKE 1 TABLET BY MOUTH ONCE A DAY   30 tablet   1   . cephALEXin (KEFLEX) 500 MG capsule   Oral   Take 1 capsule (500 mg total) by mouth 4 (four) times daily.   28 capsule   0   . EXPIRED: metFORMIN (GLUCOPHAGE) 500 MG tablet   Oral   Take 1 tablet (500 mg total) by mouth 2 (two) times daily with a meal.   60 tablet   3   . Multiple Vitamin (MULTIVITAMIN) tablet   Oral   Take 1 tablet by mouth daily.         . naproxen (NAPROSYN) 500 MG tablet   Oral   Take 1 tablet (500 mg total) by mouth 2 (two) times daily with a meal.   30 tablet   0    BP 120/80  Pulse 113  Temp(Src) 97.9 F (36.6 C) (Oral)  Resp 22  Ht 5\' 4"  (1.626 m)  Wt 222 lb (100.699 kg)  BMI 38.09 kg/m2  SpO2 99% Physical Exam  Nursing  note and vitals reviewed. Constitutional: She appears well-developed and well-nourished. No distress.  HENT:  Head: Normocephalic and atraumatic.  Mouth/Throat: Oropharynx is clear and moist. No oropharyngeal exudate.  Mild ttp in the hairline posteriorly - no masses / LAD or rash.  Mild sinus ttp over max sinuses  Eyes: Conjunctivae and EOM are normal. Pupils are equal, round, and reactive to light. Right eye exhibits no discharge. Left eye exhibits no discharge. No scleral icterus.  Neck: Normal range of motion. Neck supple. No JVD present. No thyromegaly present.  Cardiovascular: Normal rate, regular rhythm, normal heart sounds and intact distal pulses.  Exam reveals no gallop and no friction rub.   No murmur heard. Pulmonary/Chest: Effort normal and breath sounds normal. No respiratory distress. She has no wheezes. She has no rales.  Abdominal: Soft. Bowel sounds are normal. She exhibits no distension and no mass. There is no tenderness.  Musculoskeletal: Normal range of motion. She exhibits no edema and no tenderness.  Lymphadenopathy:    She has no cervical adenopathy.  Neurological: She is alert. Coordination normal.  Skin: Skin is warm and dry. No rash noted. No erythema.  Psychiatric: She has a normal mood and affect. Her behavior is normal.    ED Course  Procedures (including critical care time) Labs Review Labs Reviewed  URINALYSIS, ROUTINE W REFLEX MICROSCOPIC - Abnormal; Notable for the following:    Color, Urine STRAW (*)    Specific Gravity, Urine <1.005 (*)    Hgb urine dipstick TRACE (*)    Leukocytes, UA MODERATE (*)    All other components within normal limits  URINE MICROSCOPIC-ADD ON - Abnormal; Notable for the following:    Squamous Epithelial / LPF MANY (*)    Bacteria, UA MANY (*)    All other components within normal limits  URINE CULTURE  PREGNANCY, URINE   Imaging Review No results found.  EKG Interpretation   None  MDM   1. Headache    2. Dizziness   3. Sinusitis   4. UTI (lower urinary tract infection)    Lungs clear, no fever or hypoxia, will given pain meds, check UA to r/o UTI / dehdyration - pt's concern of being dehdyrated b/c of dizziness this evening was primary reason for visit.  UTI present, pt improved with meds, stable for d/c.  Meds given in ED:  Medications  ketorolac (TORADOL) injection 60 mg (60 mg Intramuscular Given 10/21/13 0140)  azithromycin (ZITHROMAX) tablet 500 mg (500 mg Oral Given 10/21/13 0140)    Discharge Medication List as of 10/21/2013  2:40 AM    START taking these medications   Details  cephALEXin (KEFLEX) 500 MG capsule Take 1 capsule (500 mg total) by mouth 4 (four) times daily., Starting 10/21/2013, Until Discontinued, Print    naproxen (NAPROSYN) 500 MG tablet Take 1 tablet (500 mg total) by mouth 2 (two) times daily with a meal., Starting 10/21/2013, Until Discontinued, Print          Johnna Acosta, MD 10/21/13 [phone]

## 2013-10-22 LAB — URINE CULTURE: Colony Count: 50000

## 2013-10-23 ENCOUNTER — Telehealth: Payer: Self-pay | Admitting: Family Medicine

## 2013-10-23 NOTE — Telephone Encounter (Signed)
Sick since  Sinus pressure was started on treatment the following Wenesday via tele, nearly passed out on Friday night nearly passed out seen in Ed , dx with UTi and was prescribed pain pills. Continues to feel weak with no significant improvement, also noted bright red blood post urination this am, first tepisdoe . New meds form Ed keflex and naproxen, just completing pred dose pack for allergic symptoms Advised to come in as a work in tomorrow at [employer] She is to bring all meds with her

## 2013-10-24 ENCOUNTER — Ambulatory Visit (HOSPITAL_COMMUNITY)
Admission: RE | Admit: 2013-10-24 | Discharge: 2013-10-24 | Disposition: A | Discharge status: Home / Self Care | Payer: 59 | Source: Ambulatory Visit | Attending: Family Medicine | Admitting: Family Medicine

## 2013-10-24 ENCOUNTER — Ambulatory Visit (INDEPENDENT_AMBULATORY_CARE_PROVIDER_SITE_OTHER): Payer: 59 | Admitting: Family Medicine

## 2013-10-24 ENCOUNTER — Other Ambulatory Visit (HOSPITAL_COMMUNITY)
Admission: RE | Admit: 2013-10-24 | Discharge: 2013-10-24 | Disposition: A | Discharge status: Home / Self Care | Payer: 59 | Source: Ambulatory Visit | Attending: Family Medicine | Admitting: Family Medicine

## 2013-10-24 ENCOUNTER — Encounter: Payer: Self-pay | Admitting: Family Medicine

## 2013-10-24 VITALS — BP 122/82 | HR 91 | Temp 98.7°F | Resp 16 | Ht 64.0 in | Wt 223.0 lb

## 2013-10-24 DIAGNOSIS — R1013 Epigastric pain: Secondary | ICD-10-CM | POA: Insufficient documentation

## 2013-10-24 DIAGNOSIS — N760 Acute vaginitis: Secondary | ICD-10-CM

## 2013-10-24 DIAGNOSIS — K7689 Other specified diseases of liver: Secondary | ICD-10-CM | POA: Insufficient documentation

## 2013-10-24 DIAGNOSIS — K3189 Other diseases of stomach and duodenum: Secondary | ICD-10-CM

## 2013-10-24 DIAGNOSIS — R10813 Right lower quadrant abdominal tenderness: Secondary | ICD-10-CM | POA: Insufficient documentation

## 2013-10-24 DIAGNOSIS — R109 Unspecified abdominal pain: Secondary | ICD-10-CM

## 2013-10-24 DIAGNOSIS — R51 Headache: Secondary | ICD-10-CM

## 2013-10-24 DIAGNOSIS — E669 Obesity, unspecified: Secondary | ICD-10-CM

## 2013-10-24 DIAGNOSIS — Z113 Encounter for screening for infections with a predominantly sexual mode of transmission: Secondary | ICD-10-CM | POA: Insufficient documentation

## 2013-10-24 DIAGNOSIS — Z139 Encounter for screening, unspecified: Secondary | ICD-10-CM

## 2013-10-24 DIAGNOSIS — J302 Other seasonal allergic rhinitis: Secondary | ICD-10-CM

## 2013-10-24 DIAGNOSIS — R7309 Other abnormal glucose: Secondary | ICD-10-CM

## 2013-10-24 DIAGNOSIS — R5383 Other fatigue: Secondary | ICD-10-CM

## 2013-10-24 DIAGNOSIS — J309 Allergic rhinitis, unspecified: Secondary | ICD-10-CM

## 2013-10-24 DIAGNOSIS — N3090 Cystitis, unspecified without hematuria: Secondary | ICD-10-CM

## 2013-10-24 DIAGNOSIS — N925 Other specified irregular menstruation: Secondary | ICD-10-CM

## 2013-10-24 DIAGNOSIS — I10 Essential (primary) hypertension: Secondary | ICD-10-CM

## 2013-10-24 DIAGNOSIS — J3089 Other allergic rhinitis: Secondary | ICD-10-CM

## 2013-10-24 DIAGNOSIS — R7303 Prediabetes: Secondary | ICD-10-CM

## 2013-10-24 DIAGNOSIS — N949 Unspecified condition associated with female genital organs and menstrual cycle: Secondary | ICD-10-CM

## 2013-10-24 DIAGNOSIS — N938 Other specified abnormal uterine and vaginal bleeding: Secondary | ICD-10-CM

## 2013-10-24 DIAGNOSIS — R319 Hematuria, unspecified: Secondary | ICD-10-CM | POA: Insufficient documentation

## 2013-10-24 DIAGNOSIS — M542 Cervicalgia: Secondary | ICD-10-CM

## 2013-10-24 DIAGNOSIS — R5381 Other malaise: Secondary | ICD-10-CM

## 2013-10-24 LAB — LIPID PANEL
Cholesterol: 174 mg/dL (ref 0–200)
HDL: 33 mg/dL — ABNORMAL LOW (ref >39)
LDL Cholesterol: 117 mg/dL — ABNORMAL HIGH (ref 0–99)
Total CHOL/HDL Ratio: 5.3 Ratio
Triglycerides: 118 mg/dL (ref <150)
VLDL: 24 mg/dL (ref 0–40)

## 2013-10-24 LAB — BASIC METABOLIC PANEL
BUN: 13 mg/dL (ref 6–23)
CO2: 26 mEq/L (ref 19–32)
Calcium: 10.2 mg/dL (ref 8.4–10.5)
Chloride: 97 mEq/L (ref 96–112)
Creat: 0.88 mg/dL (ref 0.50–1.10)
Glucose, Bld: 89 mg/dL (ref 70–99)
Potassium: 3.4 mEq/L — ABNORMAL LOW (ref 3.5–5.3)
Sodium: 139 mEq/L (ref 135–145)

## 2013-10-24 LAB — HEPATIC FUNCTION PANEL
ALT: 46 U/L — ABNORMAL HIGH (ref 0–35)
AST: 22 U/L (ref 0–37)
Albumin: 4.6 g/dL (ref 3.5–5.2)
Alkaline Phosphatase: 46 U/L (ref 39–117)
Bilirubin, Direct: 0.2 mg/dL (ref 0.0–0.3)
Indirect Bilirubin: 0.7 mg/dL (ref 0.0–0.9)
Total Bilirubin: 0.9 mg/dL (ref 0.3–1.2)
Total Protein: 7.1 g/dL (ref 6.0–8.3)

## 2013-10-24 LAB — POCT URINALYSIS DIPSTICK
Bilirubin, UA: NEGATIVE
Glucose, UA: NEGATIVE
Ketones, UA: NEGATIVE
Leukocytes, UA: NEGATIVE
Nitrite, UA: NEGATIVE
Spec Grav, UA: 1.025
Urobilinogen, UA: 1
pH, UA: 6

## 2013-10-24 LAB — CBC WITH DIFFERENTIAL/PLATELET
Basophils Absolute: 0 10*3/uL (ref 0.0–0.1)
Basophils Relative: 0 % (ref 0–1)
Eosinophils Absolute: 0.1 10*3/uL (ref 0.0–0.7)
Eosinophils Relative: 1 % (ref 0–5)
HCT: 41.2 % (ref 36.0–46.0)
Hemoglobin: 13.8 g/dL (ref 12.0–15.0)
Lymphocytes Relative: 41 % (ref 12–46)
Lymphs Abs: 4 10*3/uL (ref 0.7–4.0)
MCH: 26.6 pg (ref 26.0–34.0)
MCHC: 33.5 g/dL (ref 30.0–36.0)
MCV: 79.5 fL (ref 78.0–100.0)
Monocytes Absolute: 0.9 10*3/uL (ref 0.1–1.0)
Monocytes Relative: 9 % (ref 3–12)
Neutro Abs: 4.7 10*3/uL (ref 1.7–7.7)
Neutrophils Relative %: 48 % (ref 43–77)
Platelets: 445 10*3/uL — ABNORMAL HIGH (ref 150–400)
RBC: 5.18 MIL/uL — ABNORMAL HIGH (ref 3.87–5.11)
RDW: 13 % (ref 11.5–15.5)
WBC: 9.8 10*3/uL (ref 4.0–10.5)

## 2013-10-24 LAB — AMYLASE: Amylase: 70 U/L (ref 0–105)

## 2013-10-24 LAB — LIPASE: Lipase: 40 U/L (ref 11–59)

## 2013-10-24 LAB — HEMOGLOBIN A1C
Hgb A1c MFr Bld: 6.2 % — ABNORMAL HIGH (ref <5.7)
Mean Plasma Glucose: 131 mg/dL — ABNORMAL HIGH (ref <117)

## 2013-10-24 MED ORDER — TRIAMTERENE-HCTZ 37.5-25 MG PO TABS: ORAL_TABLET | ORAL | Status: DC

## 2013-10-24 MED ORDER — RANITIDINE HCL 150 MG/10ML PO SYRP: 150.0000 mg | ORAL_SOLUTION | Freq: Two times a day (BID) | ORAL | Status: DC

## 2013-10-24 MED ORDER — IOHEXOL 300 MG/ML  SOLN
100.0000 mL | Freq: Once | INTRAMUSCULAR | Status: AC | PRN
Start: 2013-10-24 — End: 2013-10-24
  Administered 2013-10-24: 20 mL via INTRAVENOUS

## 2013-10-24 MED ORDER — METHOCARBAMOL 750 MG PO TABS: ORAL_TABLET | ORAL | Status: DC

## 2013-10-24 NOTE — Progress Notes (Signed)
Subjective:    Patient ID: Kaylee Montgomery, female    DOB: 1981-10-15, 33 y.o.   MRN: [id number]  HPI  Pt developed sinus pressure on 12/27 called in for help the following Wednesday and was placed on antibiottics. Symptoms were periorbital pressure, pain was as much as a 10, kept her out Mon and Tusesday 29 and 30 Dec. Went to work on 12/31 , and called that same day.Symptoms have not been relieved.  However on 01/02/ while attempting to shower  Around midnight in an attempt to feel better, states she had stayed in bed the entire day, also had a  poor appetite, she attempted to shower, got vertigo and light headed and went to the ED, dx with a UTI, and was also told she had a sinus infection. Yesterday for first time noted blood after urination and only when she wipes after urination C/o epigastric discomfort  Rated at a 5 since yesterday, poor appetite. Major concern is severe fatigue and shortness of breath with minimal activity Posterior neck pain  Since 12/27 rated at a 9, feels like a knot in back of head, unable to sleep, used a sleeping pad last night not much relief    Review of Systems See HPI . Denies chest congestion, productive cough or wheezing. Denies chest pains, palpitations and leg swelling .   Denies dysuria, frequency, hesitancy or incontinence. Denies joint pain, swelling and limitation in mobility. C/o headache uncontrolled . Denies depression, anxiety or insomnia. Denies skin break down or rash.        Objective:   Physical Exam BP 122/82  Pulse 91  Temp(Src) 98.7 F (37.1 C) (Oral)  Resp 16  Ht 5\' 4"  (1.626 m)  Wt 223 lb (101.152 kg)  BMI 38.26 kg/m2  SpO2 99% Patient alert and oriented and in no cardiopulmonary distress.Ill appearing , anxious  HEENT: No facial asymmetry, EOMI, no sinus tenderness,  oropharynx pink and moist.  Neck decreased ROM, no adenopathy.No JVD  Chest: Clear to auscultation bilaterally.  CVS: S1, S2 no murmurs, no  S3.  ABD: Soft generalized tenderness , no guarding or rebound Foley catheter drained clear urine with no visible blood Ext: No edema  MS: Adequate ROM spine, shoulders, hips and knees.  Skin: Intact, no ulcerations or rash noted.  Psych: Good eye contact, normal affect. Memory intact  anxious not  depressed appearing.  CNS: CN 2-12 intact, power, tone and sensation normal throughout.        Assessment & Plan:  HYPERTENSION, ESSENTIAL Controlled, no change in medication DASH diet and commitment to daily physical activity for a minimum of 30 minutes discussed and encouraged, as a part of hypertension management. The importance of attaining a healthy weight is also discussed. \  Headache(784.0) Uncontrolled, needs to d/c anti inflammatories, robaxin recommended and prescribed  OBESITY Unchanged. Patient re-educated about  the importance of commitment to a  minimum of 150 minutes of exercise per week. The importance of healthy food choices with portion control discussed. Encouraged to start a food diary, count calories and to consider  joining a support group. Sample diet sheets offered. Goals set by the patient for the next several months.     Prediabetes Improved, did not stay on metformin prescribed Patient educated about the importance of limiting  Carbohydrate intake , the need to commit to daily physical activity for a minimum of 30 minutes , and to commit weight loss. The fact that changes in all these areas will reduce  or eliminate all together the development of diabetes is stressed.     DYSFUNCTIONAL UTERINE BLEEDING Pt to be evaluated by gynecology as she has started bleeding after having no menses for several years referral entered. Female hormoines checked also  Seasonal and perennial allergic rhinitis Uncontrolled pt encouraged to use medication daily for improved control of symptoms  Cystitis Cath UA in office negative for infection

## 2013-10-24 NOTE — Patient Instructions (Addendum)
F/u  As before.  You need a scan of your abdomen and pelvis today due to pain   On exam, bleeding is from your womb. Cath urine specimen is checked today, small amt of blood is present in urine , this will be repeated before urology evaluation is done   CBc and diff stat, chem 7 lipase and amylase stat.  Not stat HBA1C, FSH , LH, lipid , hepatic panel, h pylori  Work excuse  To cover 12/29, 12/30, 01/02, 01/05 to return 10/31/2013  You will be contacted with results as soon as available  For neck and head pain pls stop ibuprofen, naproxen and prednisone, as likely irritating your stomach.Robaxin is prescribed for neck pain and headache, also call your neurologist for evaluation of the headache  Zantac is prescribed for upper abdominal pain  c

## 2013-10-25 ENCOUNTER — Other Ambulatory Visit: Payer: Self-pay | Admitting: Family Medicine

## 2013-10-25 DIAGNOSIS — N939 Abnormal uterine and vaginal bleeding, unspecified: Secondary | ICD-10-CM

## 2013-10-25 LAB — H. PYLORI ANTIBODY, IGG: H Pylori IgG: 0.52 {ISR}

## 2013-10-25 LAB — LUTEINIZING HORMONE: LH: <0.1 m[IU]/mL

## 2013-10-25 LAB — FOLLICLE STIMULATING HORMONE: FSH: 0.7 m[IU]/mL

## 2013-10-25 MED ORDER — METRONIDAZOLE 500 MG PO TABS: 500.0000 mg | ORAL_TABLET | Freq: Two times a day (BID) | ORAL | Status: DC

## 2013-10-26 ENCOUNTER — Telehealth: Payer: Self-pay | Admitting: Family Medicine

## 2013-10-26 ENCOUNTER — Ambulatory Visit: Payer: 59 | Admitting: Internal Medicine

## 2013-10-27 NOTE — Telephone Encounter (Signed)
Patient is aware 

## 2013-10-28 ENCOUNTER — Ambulatory Visit (HOSPITAL_COMMUNITY): Admission: RE | Admit: 2013-10-28 | Payer: 59 | Source: Ambulatory Visit

## 2013-10-28 ENCOUNTER — Ambulatory Visit (HOSPITAL_COMMUNITY)
Admission: RE | Admit: 2013-10-28 | Discharge: 2013-10-28 | Disposition: A | Discharge status: Home / Self Care | Payer: 59 | Source: Ambulatory Visit | Attending: Family Medicine | Admitting: Family Medicine

## 2013-10-28 ENCOUNTER — Other Ambulatory Visit: Payer: Self-pay | Admitting: Family Medicine

## 2013-10-28 DIAGNOSIS — N939 Abnormal uterine and vaginal bleeding, unspecified: Secondary | ICD-10-CM

## 2013-10-28 DIAGNOSIS — N926 Irregular menstruation, unspecified: Secondary | ICD-10-CM | POA: Insufficient documentation

## 2013-11-17 ENCOUNTER — Other Ambulatory Visit (HOSPITAL_COMMUNITY)
Admission: RE | Admit: 2013-11-17 | Discharge: 2013-11-17 | Disposition: A | Discharge status: Home / Self Care | Payer: 59 | Source: Ambulatory Visit | Attending: Family Medicine | Admitting: Family Medicine

## 2013-11-17 ENCOUNTER — Ambulatory Visit (INDEPENDENT_AMBULATORY_CARE_PROVIDER_SITE_OTHER): Payer: 59 | Admitting: Family Medicine

## 2013-11-17 ENCOUNTER — Encounter: Payer: Self-pay | Admitting: Family Medicine

## 2013-11-17 VITALS — BP 120/82 | HR 90 | Resp 16 | Ht 64.0 in | Wt 233.0 lb

## 2013-11-17 DIAGNOSIS — Z01419 Encounter for gynecological examination (general) (routine) without abnormal findings: Secondary | ICD-10-CM | POA: Insufficient documentation

## 2013-11-17 DIAGNOSIS — Z0000 Encounter for general adult medical examination without abnormal findings: Secondary | ICD-10-CM

## 2013-11-17 DIAGNOSIS — R7303 Prediabetes: Secondary | ICD-10-CM

## 2013-11-17 DIAGNOSIS — E669 Obesity, unspecified: Secondary | ICD-10-CM

## 2013-11-17 DIAGNOSIS — R51 Headache: Secondary | ICD-10-CM

## 2013-11-17 DIAGNOSIS — Z1151 Encounter for screening for human papillomavirus (HPV): Secondary | ICD-10-CM | POA: Insufficient documentation

## 2013-11-17 DIAGNOSIS — N938 Other specified abnormal uterine and vaginal bleeding: Secondary | ICD-10-CM

## 2013-11-17 DIAGNOSIS — R7309 Other abnormal glucose: Secondary | ICD-10-CM

## 2013-11-17 DIAGNOSIS — I10 Essential (primary) hypertension: Secondary | ICD-10-CM

## 2013-11-17 DIAGNOSIS — Z124 Encounter for screening for malignant neoplasm of cervix: Secondary | ICD-10-CM

## 2013-11-17 DIAGNOSIS — N925 Other specified irregular menstruation: Secondary | ICD-10-CM

## 2013-11-17 DIAGNOSIS — N949 Unspecified condition associated with female genital organs and menstrual cycle: Secondary | ICD-10-CM

## 2013-11-17 MED ORDER — TOPIRAMATE 50 MG PO TABS: 50.0000 mg | ORAL_TABLET | Freq: Two times a day (BID) | ORAL | Status: DC

## 2013-11-17 NOTE — Assessment & Plan Note (Addendum)
Worsening headaches new right sided "lancinating headaches" and uncontrolled migraines.Topamax dose increase to 100mg  daily Referred back to neurology for re eval and management, pt has a pituitary tumor also

## 2013-11-17 NOTE — Assessment & Plan Note (Addendum)
Annual exam as documented.Pap sent Counseling done  re healthy lifestyle involving commitment to 150 minutes exercise per week, heart healthy diet, and attaining healthy weight.The importance of adequate sleep also discussed. Regular seat belt use and safe storage  of firearms if patient has them, is also discussed. Changes in health habits are decided on by the patient with goals and time frames  set for achieving them. Immunization and cancer screening needs are specifically addressed at this visit.

## 2013-11-17 NOTE — Patient Instructions (Addendum)
F/u in 4 month,  Take metformin one daily , congrats on improved blood sugar  It is important that you exercise regularly at least 30 minutes 5 times a week. If you develop chest pain, have severe difficulty breathing, or feel very tired, stop exercising immediately and seek medical attention   A healthy diet is rich in fruit, vegetables and whole grains. Poultry fish, nuts and beans are a healthy choice for protein rather then red meat. A low sodium diet and drinking 64 ounces of water daily is generally recommended. Oils and sweet should be limited. Carbohydrates especially for those who are diabetic or overweight, should be limited to 34-45 gram per meal. It is important to eat on a regular schedule, at least 3 times daily. Snacks should be primarily fruits, vegetables or nuts.  Cyst under right eye lid, dee eye specialist if it gets bigger, antibiotics will not help now   For the new type of shooting headaches, you are referred to neurology, and since you have migraines about 3 times per month, increase topamax to 50mg twice daily  You are referred to Dr Cousins re menstrual bleeding years after you had a period for further eval Nonfasting hBa1C and chem 7 and EGFR in 4 month, just before f/u  Weight loss goal of 2 to 3 pounds per month  

## 2013-11-17 NOTE — Assessment & Plan Note (Signed)
Improved. Patient educated about the importance of limiting  Carbohydrate intake , the need to commit to daily physical activity for a minimum of 30 minutes , and to commit weight loss. The fact that changes in all these areas will reduce or eliminate all together the development of diabetes is stressed.   Will try to take metformin consistently , will call if unable

## 2013-11-17 NOTE — Assessment & Plan Note (Signed)
Pt has had  a cycle in Jan 2015, first time in over 5 years, unclear what the trigger is, of note was recently started on metformin, which she took irregularly. Pelvic US and fSh and LH and TSH are within normal will refer to gyne for further e val

## 2013-11-17 NOTE — Progress Notes (Signed)
   Subjective:    Patient ID: Kaylee Montgomery, female    DOB: 05-23-1981, 33 y.o.   MRN: [id number]  HPI Pt in for annual exam and review of recent lab data. Sh had discontinued her metformin, due to adverse s/e of bowel movements while at work , which she did not like . States she will try to take the medication later. Her blood sugar has improved with dietary change and an exercise commitment, weight has increased unfortunately, states she has "been overeating" once she got over her recent acute illness . Still has concerns of new 'sharp lancinating" headaches on right side of head, which are different form her migraines. Migraines are still 3 to 4 per month, she is taking 50 mg topamax, I have advised topamax be increased to 100mg  daily and will refer her back to neurology for headache re evaluation. She has a pituitary tumor which needs to be followed by neurology also. Earlier this year between Jan 3 to 7 approx she had a regular period, with cramps and irritability, first time in over 5 years .Ultrasound and levels of female hormones are none diagnostic, she asks about the possible connection wit the pituitary tumor and I expalain that neurology will need to give their input  Of interest though she did not consistently take the metformin, she was introduced to the medic ation in the preceding 3 months, and this may actually have played a role in  Inducing ovulation, I will have gyne further evaluate C/o painless cyst under right eye in past several days seems bigger, no drainage , no h/o trauma Sinus infection has cleared andneeds to return to allergist for allergy testing   Review of Systems See HPI Denies recent fever or chills. Denies sinus pressure, nasal congestion, ear pain or sore throat. Denies chest congestion, productive cough or wheezing. Denies chest pains, palpitations and leg swelling Denies abdominal pain, nausea, vomiting,diarrhea or constipation.   Denies dysuria,  frequency, hesitancy or incontinence. Denies joint pain, swelling and limitation in mobility. Denies seizures, numbness, or tingling. Denies depression, anxiety or insomnia.         Objective:   Physical Exam  Pleasant obese female, alert and oriented x 3, in no cardio-pulmonary distress. Afebrile. HEENT No facial trauma or asymetry. Sinuses non tender.  EOMI, PERTL.  External ears normal, tympanic membranes clear. Oropharynx moist, no exudate, good dentition. Neck: supple, no adenopathy,JVD or thyromegaly.No bruits.  Chest: Clear to ascultation bilaterally.No crackles or wheezes. Non tender to palpation  Breast: No asymetry,no masses. No nipple discharge or inversion. No axillary or supraclavicular adenopathy  Cardiovascular system; Heart sounds normal,  S1 and  S2 ,no S3.  No murmur, or thrill. Apical beat not displaced Peripheral pulses normal.  Abdomen: Soft, non tender, no organomegaly or masses. No bruits. Bowel sounds normal. No guarding, tenderness or rebound.    GU: External genitalia normal. No lesions. Vaginal canal normal.Physiologic discharge. Uterus normal size, no adnexal masses, no cervical motion or adnexal tenderness.  Musculoskeletal exam: Full ROM of spine, hips , shoulders and knees. No deformity ,swelling or crepitus noted. No muscle wasting or atrophy.   Neurologic: Cranial nerves 2 to 12 intact. Power, tone ,sensation  normal throughout. No disturbance in gait. No tremor.  Skin: Intact, no ulceration, erythema , scaling or rash noted. Pigmentation normal throughout. Non infected cyst on right lower eyelid  Psych; Normal mood and affect. Judgement and concentration normal       Assessment & Plan:

## 2013-11-17 NOTE — Assessment & Plan Note (Signed)
Improved. Pt applauded on succesful weight loss through lifestyle change, and encouraged to continue same. Weight loss goal set for the next several months.  

## 2013-11-21 ENCOUNTER — Encounter: Payer: Self-pay | Admitting: Family Medicine

## 2013-11-21 ENCOUNTER — Telehealth: Payer: Self-pay | Admitting: Neurology

## 2013-11-23 NOTE — Telephone Encounter (Signed)
Left msg for patient call office appt avail 11/24/13. 1:30, 2:45.

## 2013-12-15 DIAGNOSIS — N3090 Cystitis, unspecified without hematuria: Secondary | ICD-10-CM | POA: Insufficient documentation

## 2013-12-15 NOTE — Assessment & Plan Note (Signed)
Controlled, no change in medication DASH diet and commitment to daily physical activity for a minimum of 30 minutes discussed and encouraged, as a part of hypertension management. The importance of attaining a healthy weight is also discussed.  

## 2013-12-15 NOTE — Assessment & Plan Note (Signed)
Cath UA in office negative for infection

## 2013-12-15 NOTE — Assessment & Plan Note (Signed)
Pt to be evaluated by gynecology as she has started bleeding after having no menses for several years referral entered. Female hormoines checked also

## 2013-12-15 NOTE — Assessment & Plan Note (Signed)
Improved, did not stay on metformin prescribed Patient educated about the importance of limiting  Carbohydrate intake , the need to commit to daily physical activity for a minimum of 30 minutes , and to commit weight loss. The fact that changes in all these areas will reduce or eliminate all together the development of diabetes is stressed.

## 2013-12-15 NOTE — Assessment & Plan Note (Signed)
Unchanged. Patient re-educated about  the importance of commitment to a  minimum of 150 minutes of exercise per week. The importance of healthy food choices with portion control discussed. Encouraged to start a food diary, count calories and to consider  joining a support group. Sample diet sheets offered. Goals set by the patient for the next several months.    

## 2013-12-15 NOTE — Assessment & Plan Note (Signed)
Uncontrolled, needs to d/c anti inflammatories, robaxin recommended and prescribed

## 2013-12-15 NOTE — Assessment & Plan Note (Signed)
Uncontrolled pt encouraged to use medication daily for improved control of symptoms

## 2014-01-06 NOTE — Progress Notes (Signed)
   Subjective:    Patient ID: Kaylee Montgomery, female    DOB: Feb 22, 1981, 33 y.o.   MRN: [id number]  HPI    Review of Systems     Objective:   Physical Exam Abdominal exam: Generalized tenderness, no guarding or rebound.  Bowel sounds present and normal Unable to appreciate organomegaly or mass,ited exam due large abdominal girth       Assessment & Plan:  Acute abdominal pain with h/o poor appetite , nausea and hematuria. Of note, pt has had no menstrual flow for over 5 years. Foley catheter inserted and urine drained was negative for blood Scan of abdomen and pelvis to further  evaluate GI and urinary symptoms

## 2014-01-19 ENCOUNTER — Telehealth: Payer: Self-pay

## 2014-01-19 MED ORDER — CLONAZEPAM 0.5 MG PO TABS: ORAL_TABLET | ORAL | Status: DC

## 2014-01-19 NOTE — Telephone Encounter (Signed)
Med sent to pharmacy and patient made aware.

## 2014-01-19 NOTE — Telephone Encounter (Signed)
Advise klonopin low dose short term bedtime only, needs OV early May about this is f persists, pls fax and let her know

## 2014-05-22 ENCOUNTER — Other Ambulatory Visit: Payer: Self-pay | Admitting: Family Medicine

## 2014-07-03 ENCOUNTER — Other Ambulatory Visit: Payer: Self-pay | Admitting: Family Medicine

## 2014-09-09 ENCOUNTER — Other Ambulatory Visit: Payer: Self-pay | Admitting: Family Medicine

## 2014-11-25 ENCOUNTER — Other Ambulatory Visit: Payer: Self-pay | Admitting: Family Medicine

## 2015-01-10 ENCOUNTER — Ambulatory Visit (INDEPENDENT_AMBULATORY_CARE_PROVIDER_SITE_OTHER): Payer: 59 | Admitting: Family Medicine

## 2015-01-10 ENCOUNTER — Encounter: Payer: Self-pay | Admitting: Family Medicine

## 2015-01-10 VITALS — BP 122/86 | HR 100 | Resp 18 | Ht 64.0 in | Wt 242.0 lb

## 2015-01-10 DIAGNOSIS — R7303 Prediabetes: Secondary | ICD-10-CM

## 2015-01-10 DIAGNOSIS — R7309 Other abnormal glucose: Secondary | ICD-10-CM

## 2015-01-10 DIAGNOSIS — I10 Essential (primary) hypertension: Secondary | ICD-10-CM | POA: Diagnosis not present

## 2015-01-10 DIAGNOSIS — E669 Obesity, unspecified: Secondary | ICD-10-CM | POA: Diagnosis not present

## 2015-01-10 DIAGNOSIS — G4733 Obstructive sleep apnea (adult) (pediatric): Secondary | ICD-10-CM

## 2015-01-10 DIAGNOSIS — J3089 Other allergic rhinitis: Secondary | ICD-10-CM

## 2015-01-10 DIAGNOSIS — J309 Allergic rhinitis, unspecified: Secondary | ICD-10-CM

## 2015-01-10 DIAGNOSIS — J302 Other seasonal allergic rhinitis: Secondary | ICD-10-CM

## 2015-01-10 MED ORDER — AZELASTINE HCL 0.1 % NA SOLN: 2.0000 | Freq: Two times a day (BID) | NASAL | Status: DC

## 2015-01-10 MED ORDER — PHENTERMINE HCL 37.5 MG PO TBDP: ORAL_TABLET | ORAL | Status: DC

## 2015-01-10 NOTE — Patient Instructions (Addendum)
F/ in 4 month, call if you need me before  Continue blood pressure med,  But pls commit to 30 mins exercise every day an reduce salt, fatty foods and sguar in your diet  New is phentermine HALF daily  Aim for 64 ounces water every day  You are referred to [facility] allergy as discussed  Pls have recent labs and immunizations sent here for your medical record  You will get info re setting up appt for bariaitric surgery  You need your sleep apnea evaluated and treated

## 2015-01-11 LAB — HEMOGLOBIN A1C
Hgb A1c MFr Bld: 6.2 % — ABNORMAL HIGH (ref <5.7)
Mean Plasma Glucose: 131 mg/dL — ABNORMAL HIGH (ref <117)

## 2015-01-11 LAB — COMPLETE METABOLIC PANEL WITH GFR
ALT: 67 U/L — ABNORMAL HIGH (ref 0–35)
AST: 35 U/L (ref 0–37)
Albumin: 4.9 g/dL (ref 3.5–5.2)
Alkaline Phosphatase: 38 U/L — ABNORMAL LOW (ref 39–117)
BUN: 9 mg/dL (ref 6–23)
CO2: 26 mEq/L (ref 19–32)
Calcium: 9.7 mg/dL (ref 8.4–10.5)
Chloride: 103 mEq/L (ref 96–112)
Creat: 0.59 mg/dL (ref 0.50–1.10)
GFR, Est African American: >89 mL/min
GFR, Est Non African American: >89 mL/min
Glucose, Bld: 82 mg/dL (ref 70–99)
Potassium: 3.6 mEq/L (ref 3.5–5.3)
Sodium: 139 mEq/L (ref 135–145)
Total Bilirubin: 0.7 mg/dL (ref 0.2–1.2)
Total Protein: 6.9 g/dL (ref 6.0–8.3)

## 2015-01-11 LAB — TSH: TSH: 1.744 u[IU]/mL (ref 0.350–4.500)

## 2015-01-15 ENCOUNTER — Encounter: Payer: Self-pay | Admitting: Family Medicine

## 2015-01-25 ENCOUNTER — Ambulatory Visit (INDEPENDENT_AMBULATORY_CARE_PROVIDER_SITE_OTHER): Payer: 59 | Admitting: Internal Medicine

## 2015-01-25 ENCOUNTER — Encounter: Payer: Self-pay | Admitting: Internal Medicine

## 2015-01-25 VITALS — BP 130/74 | HR 95 | Ht 65.0 in | Wt 241.4 lb

## 2015-01-25 DIAGNOSIS — J309 Allergic rhinitis, unspecified: Secondary | ICD-10-CM | POA: Diagnosis not present

## 2015-01-25 DIAGNOSIS — J302 Other seasonal allergic rhinitis: Secondary | ICD-10-CM

## 2015-01-25 DIAGNOSIS — J3089 Other allergic rhinitis: Secondary | ICD-10-CM

## 2015-01-25 MED ORDER — METHYLPREDNISOLONE ACETATE 80 MG/ML IJ SUSP
80.0000 mg | Freq: Once | INTRAMUSCULAR | Status: AC
  Administered 2015-01-25: 80 mg via INTRAMUSCULAR

## 2015-01-25 NOTE — Patient Instructions (Signed)
Neb neo nasal  Depo 80  Sample Dymista nasal spray   1-2 puffs each nostril once daily at bedtime      Try this for now instead of the azelastine nasal spray. When you finish the Dymista, go back to the azelastine nasal spray.

## 2015-01-25 NOTE — Progress Notes (Signed)
08/04/13 31 yoF never smoker referred by Dr Moshe Cipro  Asks allergy evaluation because of frequent headaches affected by weather and environment. For 5 years she reports postnasal drip which is no longer seasonal. Sneezing, nasal congestion, itching of eyes. Made worse by indoor heat, seasonal pollens , grass being mowed, strong fragrances. denies cough or wheeze. No benefit from nasal sprays or antihistamines. Allergy skin tested years ago without allergy vaccine. Past surgery for tonsillectomy. A surgical complication required a second procedure for repair. Not diagnosed with asthma.  09/27/13- 59 yoF never smoker followed for allergic rhinitis, complicated by OSA(Neurology manages) FOLLOWS FOR: continues to have flare ups; review labs in detail with patient. Sneezing and some retro-orbital/nasal pressure. Like Qnasl better- no drainage. Says sleep study was positive for sleep apnea by [facility]. Neurology is managing.  01/25/15- 89 yoF never smoker followed for allergic rhinitis, complicated by OSA(Neurology manages) FOLLOWS FOR: per Dr Moshe Cipro; allergies getting worse-walking outdoors and has noticed migraines coming on as well as not being able to breathe. Pt has noticed she is not able to bend as well due to facial/head pain. Allergy profile 08/04/13- Total IgE 26.7, Negative Frontal sinus pain worse bending over. Eyes itch and run now in spring pollen season. Shifting nasal congestion. Benadryl at bedtime and azelastine nasal spray.  ROS-see HPI Constitutional:   No-   weight loss, night sweats, fevers, chills, fatigue, lassitude. HEENT:   + headaches, No-difficulty swallowing, tooth/dental problems, sore throat,       +sneezing, +itching, ear ache, +nasal congestion, +post nasal drip,  CV:  No-   chest pain, orthopnea, PND, swelling in lower extremities, anasarca, dizziness, palpitations Resp: No-   shortness of breath with exertion or at rest.              No-   productive cough,   No non-productive cough,  No- coughing up of blood.              No-   change in color of mucus.  No- wheezing.   Skin: No-   rash or lesions. GI:  No-   heartburn, indigestion, abdominal pain, nausea, vomiting,  GU:  MS:  No-   joint pain or swelling.   Neuro-     nothing unusual Psych:  No- change in mood or affect. No depression or anxiety.  No memory loss.  OBJ- Physical Exam General- Alert, Oriented, Affect-appropriate, Distress- none acute.  obese Skin- rash-none, lesions- none, excoriation- none. +dry Lymphadenopathy- none Head- atraumatic            Eyes- Gross vision intact, PERRLA, conjunctivae and secretions clear            Ears- Hearing, canals-normal            Nose- + turbinate edema, +sniffing, no-Septal dev, mucus, polyps, erosion, perforation             Throat- Mallampati III-IV , mucosa clear , drainage- none, tonsils- atrophic Neck- flexible , trachea midline, no stridor , thyroid nl, carotid no bruit Chest - symmetrical excursion , unlabored           Heart/CV- RRR , no murmur , no gallop  , no rub, nl s1 s2                           - JVD- none , edema- none, stasis changes- none, varices- none  Lung- clear to P&A, wheeze- none, cough- none , dullness-none, rub- none           Chest wall-  Abd-  Br/ Gen/ Rectal- Not done, not indicated Extrem- cyanosis- none, clubbing, none, atrophy- none, strength- nl Neuro- grossly intact to observation

## 2015-01-28 NOTE — Assessment & Plan Note (Signed)
Chronic uncontrolled allergies with obstruction of upper airway and snoring , refer for re eval by allergist she has seen in the past and wants to continue with, also needs sleep apnea eval

## 2015-01-28 NOTE — Assessment & Plan Note (Signed)
Controlled, no change in medication DASH diet and commitment to daily physical activity for a minimum of 30 minutes discussed and encouraged, as a part of hypertension management. The importance of attaining a healthy weight is also discussed.  BP/Weight 01/25/2015 01/10/2015 11/17/2013 10/24/2013 10/21/2013 09/27/2013 67/34/1937  Systolic BP 902 409 735 329 924 268 341  Diastolic BP 74 86 82 82 80 64 68  Wt. (Lbs) 241.4 242 233 223 222 234.4 230  BMI 40.17 41.52 39.97 38.26 38.09 39.01 38.27

## 2015-01-28 NOTE — Assessment & Plan Note (Signed)
Seasonal exacerbation but description of headaches suggest possible sinusitis Plan-treat for the acute rhinitis component. If persistent symptoms require, consider limited CT

## 2015-01-28 NOTE — Progress Notes (Signed)
Subjective:    Patient ID: Kaylee Montgomery, female    DOB: [birth year], 34 y.o.   MRN: [id number]  HPI The PT is here for follow up and re-evaluation of chronic medical conditions, medication management and review of any available recent lab and radiology data.  Preventive health is updated, specifically  Cancer screening and Immunization.    The PT denies any adverse reactions to current medications since the last visit.  C/o increased and uncontrolled allergy symptoms and excessive and fatigue, excess snoring , has established dx of sleep apnea but not being treated. Concerned about ongoing weight gain and is interested in bariatric surgery, also wants to resume phentermine      Review of Systems See HPI Denies recent fever or chills. Denies sinus pressure, nasal congestion, ear pain or sore throat. Denies chest congestion, productive cough or wheezing. Denies chest pains, palpitations and leg swelling Denies abdominal pain, nausea, vomiting,diarrhea or constipation.   Denies dysuria, frequency, hesitancy or incontinence. Denies joint pain, swelling and limitation in mobility. Denies headaches, seizures, numbness, or tingling. Denies depression, anxiety or insomnia. Denies skin break down or rash.        Objective:   Physical Exam BP 122/86 mmHg  Pulse 100  Resp 18  Ht 5\' 4"  (1.626 m)  Wt 242 lb (109.77 kg)  BMI 41.52 kg/m2  SpO2 96% Patient alert and oriented and in no cardiopulmonary distress.  HEENT: No facial asymmetry, EOMI,   oropharynx pink and moist.  Neck supple no JVD, no mass.  Chest: Clear to auscultation bilaterally.  CVS: S1, S2 no murmurs, no S3.Regular rate.  ABD: Soft non tender.   Ext: No edema  MS: Adequate ROM spine, shoulders, hips and knees.  Skin: Intact, no ulcerations or rash noted.  Psych: Good eye contact, normal affect. Memory intact not anxious or depressed appearing.  CNS: CN 2-12 intact, power,  normal throughout.no focal  deficits noted.        Assessment & Plan:  Essential hypertension Controlled, no change in medication DASH diet and commitment to daily physical activity for a minimum of 30 minutes discussed and encouraged, as a part of hypertension management. The importance of attaining a healthy weight is also discussed.  BP/Weight 01/25/2015 01/10/2015 11/17/2013 10/24/2013 10/21/2013 09/27/2013 49/67/5916  Systolic BP 384 665 993 570 177 939 030  Diastolic BP 74 86 82 82 80 64 68  Wt. (Lbs) 241.4 242 233 223 222 234.4 230  BMI 40.17 41.52 39.97 38.26 38.09 39.01 38.27         Prediabetes Unchanged Patient educated about the importance of limiting  Carbohydrate intake , the need to commit to daily physical activity for a minimum of 30 minutes , and to commit weight loss. The fact that changes in all these areas will reduce or eliminate all together the development of diabetes is stressed.   Diabetic Labs Latest Ref Rng 01/10/2015 10/24/2013 04/26/2013 11/06/2011 03/15/2011  HbA1c <5.7 % 6.2(H) 6.2(H) 6.6(H) 5.8(H) 5.9(H)  Chol 0 - 200 mg/dL - 174 - - 187  HDL >39 mg/dL - 33(L) - - 38(L)  Calc LDL 0 - 99 mg/dL - 117(H) - - 136(H)  Triglycerides <150 mg/dL - 118 - - 65  Creatinine 0.50 - 1.10 mg/dL 0.59 0.88 0.76 - 0.59   BP/Weight 01/25/2015 01/10/2015 11/17/2013 10/24/2013 10/21/2013 09/27/2013 07/12/3006  Systolic BP 622 633 354 562 563 893 734  Diastolic BP 74 86 82 82 80 64 68  Wt. (Lbs) 241.4 242  233 223 222 234.4 230  BMI 40.17 41.52 39.97 38.26 38.09 39.01 38.27   No flowsheet data found.          Obesity Deteriorated. Patient re-educated about  the importance of commitment to a  minimum of 150 minutes of exercise per week.  The importance of healthy food choices with portion control discussed. Encouraged to start a food diary, count calories and to consider  joining a support group. Sample diet sheets offered. Goals set by the patient for the next several months. Start half phentermine  daily Provided with info re bariatric surgery which she will benefit greatly from   Weight /BMI 01/25/2015 01/10/2015 11/17/2013  WEIGHT 241 lb 6.4 oz 242 lb 233 lb  HEIGHT 5\' 5"  5\' 4"  5\' 4"   BMI 40.17 kg/m2 41.52 kg/m2 39.97 kg/m2    Current exercise per week 40 minutes.    Seasonal and perennial allergic rhinitis Chronic uncontrolled allergies with obstruction of upper airway and snoring , refer for re eval by allergist she has seen in the past and wants to continue with, also needs sleep apnea eval   Obstructive sleep apnea Pt has had positive sleep study, in 2014 but did not follow up with neurology, she will now be followed by by pulmonary for management of this condition also

## 2015-01-28 NOTE — Assessment & Plan Note (Addendum)
Deteriorated. Patient re-educated about  the importance of commitment to a  minimum of 150 minutes of exercise per week.  The importance of healthy food choices with portion control discussed. Encouraged to start a food diary, count calories and to consider  joining a support group. Sample diet sheets offered. Goals set by the patient for the next several months. Start half phentermine daily Provided with info re bariatric surgery which she will benefit greatly from   Weight /BMI 01/25/2015 01/10/2015 11/17/2013  WEIGHT 241 lb 6.4 oz 242 lb 233 lb  HEIGHT 5\' 5"  5\' 4"  5\' 4"   BMI 40.17 kg/m2 41.52 kg/m2 39.97 kg/m2    Current exercise per week 40 minutes.

## 2015-01-28 NOTE — Assessment & Plan Note (Signed)
Pt has had positive sleep study, in 2014 but did not follow up with neurology, she will now be followed by by pulmonary for management of this condition also

## 2015-01-28 NOTE — Assessment & Plan Note (Addendum)
Unchanged Patient educated about the importance of limiting  Carbohydrate intake , the need to commit to daily physical activity for a minimum of 30 minutes , and to commit weight loss. The fact that changes in all these areas will reduce or eliminate all together the development of diabetes is stressed.   Diabetic Labs Latest Ref Rng 01/10/2015 10/24/2013 04/26/2013 11/06/2011 03/15/2011  HbA1c <5.7 % 6.2(H) 6.2(H) 6.6(H) 5.8(H) 5.9(H)  Chol 0 - 200 mg/dL - 174 - - 187  HDL >39 mg/dL - 33(L) - - 38(L)  Calc LDL 0 - 99 mg/dL - 117(H) - - 136(H)  Triglycerides <150 mg/dL - 118 - - 65  Creatinine 0.50 - 1.10 mg/dL 0.59 0.88 0.76 - 0.59   BP/Weight 01/25/2015 01/10/2015 11/17/2013 10/24/2013 10/21/2013 09/27/2013 37/85/8850  Systolic BP 277 412 878 676 720 947 096  Diastolic BP 74 86 82 82 80 64 68  Wt. (Lbs) 241.4 242 233 223 222 234.4 230  BMI 40.17 41.52 39.97 38.26 38.09 39.01 38.27   No flowsheet data found.

## 2015-01-29 ENCOUNTER — Telehealth: Payer: Self-pay | Admitting: Internal Medicine

## 2015-01-29 NOTE — Telephone Encounter (Signed)
Spoke with pt. States having a possible reaction to the Depo injection on Thursday. Reports itching and redness at the injection site. Symptoms started yesterday.  Allergies  Allergen Reactions  . Bromocriptine Mesylate     REACTION: nausea    CY - please advise. Thanks.

## 2015-01-29 NOTE — Telephone Encounter (Signed)
Suggest using a poison ivy lotion on the site, like Caladryl. This will help itching and dry it up We will note this reaction to depomedrol on her allergy list

## 2015-01-29 NOTE — Telephone Encounter (Signed)
Called pt and is aware of recs. This has been noted in allergies. Nothing further needed

## 2015-02-14 ENCOUNTER — Encounter: Payer: Self-pay | Admitting: Family Medicine

## 2015-02-14 ENCOUNTER — Ambulatory Visit (INDEPENDENT_AMBULATORY_CARE_PROVIDER_SITE_OTHER): Payer: 59 | Admitting: Family Medicine

## 2015-02-14 ENCOUNTER — Other Ambulatory Visit: Payer: Self-pay | Admitting: Family Medicine

## 2015-02-14 ENCOUNTER — Ambulatory Visit (HOSPITAL_COMMUNITY)
Admission: RE | Admit: 2015-02-14 | Discharge: 2015-02-14 | Disposition: A | Discharge status: Home / Self Care | Payer: 59 | Source: Ambulatory Visit | Attending: Family Medicine | Admitting: Family Medicine

## 2015-02-14 VITALS — BP 126/82 | HR 82 | Resp 18 | Ht 64.0 in | Wt 240.0 lb

## 2015-02-14 DIAGNOSIS — M542 Cervicalgia: Secondary | ICD-10-CM | POA: Insufficient documentation

## 2015-02-14 DIAGNOSIS — J3089 Other allergic rhinitis: Secondary | ICD-10-CM

## 2015-02-14 DIAGNOSIS — I10 Essential (primary) hypertension: Secondary | ICD-10-CM | POA: Diagnosis not present

## 2015-02-14 DIAGNOSIS — M546 Pain in thoracic spine: Secondary | ICD-10-CM

## 2015-02-14 DIAGNOSIS — D1809 Hemangioma of other sites: Secondary | ICD-10-CM | POA: Insufficient documentation

## 2015-02-14 DIAGNOSIS — R7303 Prediabetes: Secondary | ICD-10-CM

## 2015-02-14 DIAGNOSIS — D352 Benign neoplasm of pituitary gland: Secondary | ICD-10-CM

## 2015-02-14 DIAGNOSIS — R519 Headache, unspecified: Secondary | ICD-10-CM

## 2015-02-14 DIAGNOSIS — N62 Hypertrophy of breast: Secondary | ICD-10-CM

## 2015-02-14 DIAGNOSIS — E669 Obesity, unspecified: Secondary | ICD-10-CM

## 2015-02-14 DIAGNOSIS — J309 Allergic rhinitis, unspecified: Secondary | ICD-10-CM

## 2015-02-14 DIAGNOSIS — R51 Headache: Secondary | ICD-10-CM | POA: Diagnosis not present

## 2015-02-14 DIAGNOSIS — D353 Benign neoplasm of craniopharyngeal duct: Secondary | ICD-10-CM

## 2015-02-14 DIAGNOSIS — R7309 Other abnormal glucose: Secondary | ICD-10-CM

## 2015-02-14 DIAGNOSIS — J302 Other seasonal allergic rhinitis: Secondary | ICD-10-CM

## 2015-02-14 MED ORDER — BACLOFEN 10 MG PO TABS: 10.0000 mg | ORAL_TABLET | Freq: Two times a day (BID) | ORAL | Status: DC

## 2015-02-14 MED ORDER — PREDNISONE 5 MG PO TABS: 5.0000 mg | ORAL_TABLET | Freq: Two times a day (BID) | ORAL | Status: AC

## 2015-02-14 NOTE — Patient Instructions (Signed)
F/u as before  Work excuse for today  Xray of neck and upper back today please  You will be referred for brain scan to follow up pituitary adenoma, we will call with appt  You are referred to neurologist on urgent basis  Prednisone and muscle relaxant sent to your pharmacy  Thanks for choosing [facility], we consider it a privelige to serve you.

## 2015-02-16 ENCOUNTER — Other Ambulatory Visit: Payer: Self-pay

## 2015-02-16 DIAGNOSIS — M542 Cervicalgia: Secondary | ICD-10-CM | POA: Insufficient documentation

## 2015-02-16 DIAGNOSIS — I10 Essential (primary) hypertension: Secondary | ICD-10-CM

## 2015-02-16 NOTE — Assessment & Plan Note (Addendum)
New onset disabling mid posterior chest paion, lancinating and radiating to entire head, x ray of thoracic spine to assess for disc disease, also trial of muscle relaxant for spasm and short course of prednisone for pain

## 2015-02-16 NOTE — Progress Notes (Signed)
Subjective:    Patient ID: Kaylee Montgomery, female    DOB: 1981/03/28, 34 y.o.   MRN: [id number]  HPI Pt in with 10 day h/o sharp and disabling neck and shoulder pains, radiates to involve her entire face,  worse in the past 5 days, rated at a 10, causes her to stop what she is doing, pounding, has awakened her from sleep on occasion. Feels a constant "grinding, twisting" sensation in h a specific area of right upper back where she identifies as area of origination of the pain. Denies any other neurologic symptoms, has a long h/o headaches and also of a pituitary adenoma, needs to re establish with neurology States her allergies are better with new treatment still has rash where steroid was injected by allergist   Review of Systems    See HPI` Denies recent fever or chills. Denies sinus pressure, nasal congestion, ear pain or sore throat. Denies chest congestion, productive cough or wheezing. Denies chest pains, palpitations and leg swelling Denies abdominal pain, nausea, vomiting,diarrhea or constipation.   Denies dysuria, frequency, hesitancy or incontinence.  Denies depression, anxiety or insomnia.      Objective:   Physical Exam  BP 126/82 mmHg  Pulse 82  Resp 18  Ht 5\' 4"  (1.626 m)  Wt 240 lb (108.863 kg)  BMI 41.18 kg/m2  SpO2 97% Patient alert and oriented and in no cardiopulmonary distress.  HEENT: No facial asymmetry, EOMI,   oropharynx pink and moist.  Neck supple no JVD, no mass.  Chest: Clear to auscultation bilaterally.  CVS: S1, S2 no murmurs, no S3.Regular rate.  ABD: Soft non tender.   Ext: No edema  MS: Adequate ROM spine, shoulders, hips and knees.Localized area of pain over right posterior chest in upper one third  Skin: Intact, no ulcerations or rash noted.  Psych: Good eye contact, normal affect. Memory intact not anxious or depressed appearing.  CNS: CN 2-12 intact, power,  normal throughout.no focal deficits noted.       Assessment  & Plan:  Headache New onset disabling headache seeming to originate from post chest right side, sharp lancinating pains Headache awakened patient from her sleep . Has pituitary microadenoma, will refer for rept imaging of this as well as for neurology eval based on headache complaint.   Thoracic spine pain New onset disabling mid posterior chest paion, lancinating and radiating to entire head, x ray of thoracic spine to assess for disc disease, also trial of muscle relaxant for spasm and short course of prednisone for pain   Essential hypertension Controlled, no change in medication DASH diet and commitment to daily physical activity for a minimum of 30 minutes discussed and encouraged, as a part of hypertension management. The importance of attaining a healthy weight is also discussed.  BP/Weight 02/14/2015 01/25/2015 01/10/2015 11/17/2013 10/24/2013 10/21/2013 03/0/0923  Systolic BP 300 762 263 335 456 256 389  Diastolic BP 82 74 86 82 82 80 64  Wt. (Lbs) 240 241.4 242 233 223 222 234.4  BMI 41.18 40.17 41.52 39.97 38.26 38.09 39.01         Seasonal and perennial allergic rhinitis Improved symptoms with medication change, recently started by allergist   Prediabetes Patient educated about the importance of limiting  Carbohydrate intake , the need to commit to daily physical activity for a minimum of 30 minutes , and to commit weight loss. The fact that changes in all these areas will reduce or eliminate all together the development of diabetes  is stressed.   Diabetic Labs Latest Ref Rng 01/10/2015 10/24/2013 04/26/2013 11/06/2011 03/15/2011  HbA1c <5.7 % 6.2(H) 6.2(H) 6.6(H) 5.8(H) 5.9(H)  Chol 0 - 200 mg/dL - 174 - - 187  HDL >39 mg/dL - 33(L) - - 38(L)  Calc LDL 0 - 99 mg/dL - 117(H) - - 136(H)  Triglycerides <150 mg/dL - 118 - - 65  Creatinine 0.50 - 1.10 mg/dL 0.59 0.88 0.76 - 0.59   BP/Weight 02/14/2015 01/25/2015 01/10/2015 11/17/2013 10/24/2013 10/21/2013 72/05/2059  Systolic BP 156 153  794 327 614 709 295  Diastolic BP 82 74 86 82 82 80 64  Wt. (Lbs) 240 241.4 242 233 223 222 234.4  BMI 41.18 40.17 41.52 39.97 38.26 38.09 39.01   No flowsheet data found.      Posterior neck pain New onset sharp lancinating posterior neck pain involving entire head, will order c spine to evaluate for potential c spine disease as a cause     Obesity Unchanged Patient re-educated about  the importance of commitment to a  minimum of 150 minutes of exercise per week.  The importance of healthy food choices with portion control discussed. Encouraged to start a food diary, count calories and to consider  joining a support group. Sample diet sheets offered. Goals set by the patient for the next several months.   Weight /BMI 02/14/2015 01/25/2015 01/10/2015  WEIGHT 240 lb 241 lb 6.4 oz 242 lb  HEIGHT 5\' 4"  5\' 5"  5\' 4"   BMI 41.18 kg/m2 40.17 kg/m2 41.52 kg/m2    Current exercise per week 60 minutes.    GYNECOMASTIA Very likely contributor to thoracic pain   PITUITARY ADENOMA Needs re imaging study and neurology follow up

## 2015-02-16 NOTE — Assessment & Plan Note (Signed)
Controlled, no change in medication DASH diet and commitment to daily physical activity for a minimum of 30 minutes discussed and encouraged, as a part of hypertension management. The importance of attaining a healthy weight is also discussed.  BP/Weight 02/14/2015 01/25/2015 01/10/2015 11/17/2013 10/24/2013 10/21/2013 94/10/7406  Systolic BP 144 818 563 149 702 637 858  Diastolic BP 82 74 86 82 82 80 64  Wt. (Lbs) 240 241.4 242 233 223 222 234.4  BMI 41.18 40.17 41.52 39.97 38.26 38.09 39.01

## 2015-02-16 NOTE — Assessment & Plan Note (Signed)
Very likely contributor to thoracic pain

## 2015-02-16 NOTE — Assessment & Plan Note (Signed)
Needs re imaging study and neurology follow up

## 2015-02-16 NOTE — Assessment & Plan Note (Signed)
New onset disabling headache seeming to originate from post chest right side, sharp lancinating pains Headache awakened patient from her sleep . Has pituitary microadenoma, will refer for rept imaging of this as well as for neurology eval based on headache complaint.

## 2015-02-16 NOTE — Assessment & Plan Note (Signed)
Patient educated about the importance of limiting  Carbohydrate intake , the need to commit to daily physical activity for a minimum of 30 minutes , and to commit weight loss. The fact that changes in all these areas will reduce or eliminate all together the development of diabetes is stressed.   Diabetic Labs Latest Ref Rng 01/10/2015 10/24/2013 04/26/2013 11/06/2011 03/15/2011  HbA1c <5.7 % 6.2(H) 6.2(H) 6.6(H) 5.8(H) 5.9(H)  Chol 0 - 200 mg/dL - 174 - - 187  HDL >39 mg/dL - 33(L) - - 38(L)  Calc LDL 0 - 99 mg/dL - 117(H) - - 136(H)  Triglycerides <150 mg/dL - 118 - - 65  Creatinine 0.50 - 1.10 mg/dL 0.59 0.88 0.76 - 0.59   BP/Weight 02/14/2015 01/25/2015 01/10/2015 11/17/2013 10/24/2013 10/21/2013 49/04/5299  Systolic BP 511 021 117 356 701 410 301  Diastolic BP 82 74 86 82 82 80 64  Wt. (Lbs) 240 241.4 242 233 223 222 234.4  BMI 41.18 40.17 41.52 39.97 38.26 38.09 39.01   No flowsheet data found.

## 2015-02-16 NOTE — Assessment & Plan Note (Signed)
New onset sharp lancinating posterior neck pain involving entire head, will order c spine to evaluate for potential c spine disease as a cause

## 2015-02-16 NOTE — Assessment & Plan Note (Signed)
Unchanged Patient re-educated about  the importance of commitment to a  minimum of 150 minutes of exercise per week.  The importance of healthy food choices with portion control discussed. Encouraged to start a food diary, count calories and to consider  joining a support group. Sample diet sheets offered. Goals set by the patient for the next several months.   Weight /BMI 02/14/2015 01/25/2015 01/10/2015  WEIGHT 240 lb 241 lb 6.4 oz 242 lb  HEIGHT 5\' 4"  5\' 5"  5\' 4"   BMI 41.18 kg/m2 40.17 kg/m2 41.52 kg/m2    Current exercise per week 60 minutes.

## 2015-02-16 NOTE — Assessment & Plan Note (Addendum)
Improved symptoms with medication change, recently started by allergist

## 2015-02-22 ENCOUNTER — Other Ambulatory Visit (HOSPITAL_COMMUNITY): Payer: 59

## 2015-02-23 ENCOUNTER — Ambulatory Visit (INDEPENDENT_AMBULATORY_CARE_PROVIDER_SITE_OTHER): Payer: 59 | Admitting: Neurology

## 2015-02-23 ENCOUNTER — Encounter: Payer: Self-pay | Admitting: Neurology

## 2015-02-23 VITALS — BP 113/70 | HR 106 | Ht 65.0 in | Wt 242.2 lb

## 2015-02-23 DIAGNOSIS — G43019 Migraine without aura, intractable, without status migrainosus: Secondary | ICD-10-CM | POA: Insufficient documentation

## 2015-02-23 MED ORDER — SUMATRIPTAN SUCCINATE 100 MG PO TABS: 100.0000 mg | ORAL_TABLET | Freq: Two times a day (BID) | ORAL | Status: DC | PRN

## 2015-02-23 MED ORDER — PROPRANOLOL HCL 20 MG PO TABS: ORAL_TABLET | ORAL | Status: DC

## 2015-02-23 NOTE — Progress Notes (Signed)
Reason for visit: Migraine headache  Referring physician: Dr. Daryll Drown is a 34 y.o. female  History of present illness:  Kaylee Montgomery is a 34 year old right-handed black female with a history of intractable headaches. The patient was last seen in 2014 for headaches. The patient began having daily headaches in 2012. She indicates that her headaches started when she was in college. The headaches typically will start in the occipital area, and spread to the temporal regions bilaterally. Last week, the patient began having sharp jabbing pains that will begin in the right shoulder, spread to the back of the head, with a sensation of discomfort into the gums. The headache within go into her typical headache pain into the temporal regions. The patient indicates that the sharp jabbing pains will last about 2 minutes and are incapacitating. The patient missed some work last week. The episodes are occurring almost daily. The patient was given a course of prednisone which seemed to improve the headaches, she has not had any sharp pains in 4 days. The patient denies any nausea or vomiting or any photophobia or phonophobia with the headache. She occasionally will have some numbness of the left foot. She has been set up for MRI evaluation of the brain, this has not yet been done. The patient does have some allergy symptoms at times. She has also noted that her son is beginning to have very similar headaches as described above. She is sent to this office for further evaluation. The last MRI of the brain was done in 2013, and was relatively unremarkable. The patient does not note any activating factors for the headache. She will take Advil when the headache comes on. In the past, she has not responded well to Topamax and the use of nortriptyline did not completely control her headaches.  Past Medical History  Diagnosis Date  . Pituitary adenoma   . Gynecomastia   . ALLERGIC RHINITIS   . Sinusitis    . Obesity   . Headache(784.0)   . Hypertension   . Sleep apnea   . Common migraine with intractable migraine 02/23/2015    Past Surgical History  Procedure Laterality Date  . Caesarean    . Tonsillectomy      Family History  Problem Relation Age of Onset  . Diabetes Maternal Grandfather   . Heart disease Father     stent  . Migraines Father   . Migraines Son     Social history:  reports that she has never smoked. She has never used smokeless tobacco. She reports that she drinks alcohol. She reports that she does not use illicit drugs.  Medications:  Prior to Admission medications   Medication Sig Start Date End Date Taking? Authorizing Provider  azelastine (ASTELIN) 0.1 % nasal spray Place 2 sprays into both nostrils 2 (two) times daily. Use in each nostril as directed 01/10/15  Yes [name], MD  baclofen (LIORESAL) 10 MG tablet Take 1 tablet (10 mg total) by mouth 2 (two) times daily. 02/14/15  Yes [name], MD  diphenhydrAMINE (BENADRYL) 25 mg capsule Take 25 mg by mouth at bedtime as needed.   Yes Historical Provider, MD  Multiple Vitamin (MULTIVITAMIN) tablet Take 1 tablet by mouth daily.   Yes Historical Provider, MD  Phentermine HCl 37.5 MG TBDP One tablet once daily 01/10/15  Yes [name], MD  triamterene-hydrochlorothiazide (MAXZIDE-25) 37.5-25 MG per tablet TAKE 1 TABLET BY MOUTH ONCE A DAY 11/27/14  Yes [name], MD  propranolol (INDERAL) 20 MG tablet One tablet twice a day for 2 weeks, then take 2 tablets twice a day 02/23/15   Kathrynn Ducking, MD  SUMAtriptan (IMITREX) 100 MG tablet Take 1 tablet (100 mg total) by mouth 2 (two) times daily as needed for migraine. 02/23/15   Kathrynn Ducking, MD      Allergies  Allergen Reactions  . Bromocriptine Mesylate     REACTION: nausea  . Depo-Medrol [Methylprednisolone Acetate]     Reports itching and redness at the injection site    ROS:  Out of a complete 14 system review of  symptoms, the patient complains only of the following symptoms, and all other reviewed systems are negative.  Joint pain, muscle cramps, aching muscles Allergies Headache Not enough sleep, decreased energy  Blood pressure 113/70, pulse 106, height 5\' 5"  (1.651 m), weight 242 lb 3.2 oz (109.861 kg).  Physical Exam  General: The patient is alert and cooperative at the time of the examination.  Eyes: Pupils are equal, round, and reactive to light. Discs are flat bilaterally.  Neck: The neck is supple, no carotid bruits are noted.  Respiratory: The respiratory examination is clear.  Cardiovascular: The cardiovascular examination reveals a regular rate and rhythm, no obvious murmurs or rubs are noted.  Skin: Extremities are without significant edema.  Neurologic Exam  Mental status: The patient is alert and oriented x 3 at the time of the examination. The patient has apparent normal recent and remote memory, with an apparently normal attention span and concentration ability.  Cranial nerves: Facial symmetry is present. There is good sensation of the face to pinprick and soft touch bilaterally. The strength of the facial muscles and the muscles to head turning and shoulder shrug are normal bilaterally. Speech is well enunciated, no aphasia or dysarthria is noted. Extraocular movements are full. Visual fields are full. The tongue is midline, and the patient has symmetric elevation of the soft palate. No obvious hearing deficits are noted.  Motor: The motor testing reveals 5 over 5 strength of all 4 extremities. Good symmetric motor tone is noted throughout.  Sensory: Sensory testing is intact to pinprick, soft touch, vibration sensation, and position sense on all 4 extremities. No evidence of extinction is noted.  Coordination: Cerebellar testing reveals good finger-nose-finger and heel-to-shin bilaterally.  Gait and station: Gait is normal. Tandem gait is normal. Romberg is negative. No  drift is seen.  Reflexes: Deep tendon reflexes are symmetric and normal bilaterally. Toes are downgoing bilaterally.   Assessment/Plan:  1. Intractable headache, migraine  The patient is having frequent headaches. The patient is having sharp jabbing pain in the occipital area spreading into the temporal regions bilaterally. The patient has had very frequent headaches since 2012. She has been set up for MRI evaluation of the brain. I will place her on propranolol at this time, working up on the dose. We will try Imitrex when the headache comes on. If the medications are not effective, the patient will be brought back in for occipital nerve injections and she can potentially be a candidate for Botox injections. She otherwise will follow-up in 4 months.  Jill Alexanders MD 02/23/2015 8:06 PM  [facility] [address] [place], [place] [address]  Phone [phone] Fax [phone]

## 2015-02-23 NOTE — Patient Instructions (Signed)

## 2015-02-27 ENCOUNTER — Ambulatory Visit (HOSPITAL_COMMUNITY): Admission: RE | Admit: 2015-02-27 | Payer: 59 | Source: Ambulatory Visit

## 2015-03-29 ENCOUNTER — Ambulatory Visit (INDEPENDENT_AMBULATORY_CARE_PROVIDER_SITE_OTHER): Payer: 59 | Admitting: Internal Medicine

## 2015-03-29 ENCOUNTER — Encounter: Payer: Self-pay | Admitting: Internal Medicine

## 2015-03-29 VITALS — BP 112/80 | HR 85 | Ht 65.0 in | Wt 243.8 lb

## 2015-03-29 DIAGNOSIS — J302 Other seasonal allergic rhinitis: Secondary | ICD-10-CM

## 2015-03-29 DIAGNOSIS — G4733 Obstructive sleep apnea (adult) (pediatric): Secondary | ICD-10-CM | POA: Diagnosis not present

## 2015-03-29 DIAGNOSIS — J309 Allergic rhinitis, unspecified: Secondary | ICD-10-CM

## 2015-03-29 DIAGNOSIS — J3089 Other allergic rhinitis: Secondary | ICD-10-CM

## 2015-03-29 MED ORDER — PHENYLEPHRINE HCL 1 % NA SOLN
3.0000 [drp] | Freq: Once | NASAL | Status: AC
  Administered 2015-03-29: 3 [drp] via NASAL

## 2015-03-29 NOTE — Patient Instructions (Addendum)
Order- referral to Dr Oneal Grout, Orthodontist  Consider oral appliance for sleep apnea  Ok to cancel June 13 appointment and reschedule in 3 months for allergy and sleep follow-up  Neb neo nasal  Try otc nasal steroid spray Flonase/ fluticasone

## 2015-03-29 NOTE — Progress Notes (Signed)
08/04/13 31 yoF never smoker referred by Dr Moshe Cipro  Asks allergy evaluation because of frequent headaches affected by weather and environment. For 5 years she reports postnasal drip which is no longer seasonal. Sneezing, nasal congestion, itching of eyes. Made worse by indoor heat, seasonal pollens , grass being mowed, strong fragrances. denies cough or wheeze. No benefit from nasal sprays or antihistamines. Allergy skin tested years ago without allergy vaccine. Past surgery for tonsillectomy. A surgical complication required a second procedure for repair. Not diagnosed with asthma.  09/27/13- 19 yoF never smoker followed for allergic rhinitis, complicated by OSA(Neurology manages) FOLLOWS FOR: continues to have flare ups; review labs in detail with patient. Sneezing and some retro-orbital/nasal pressure. Like Qnasl better- no drainage. Says sleep study was positive for sleep apnea by [facility]. Neurology is managing.  01/25/15- 33 yoF never smoker followed for allergic rhinitis, complicated by OSA(Neurology manages) FOLLOWS FOR: per Dr Moshe Cipro; allergies getting worse-walking outdoors and has noticed migraines coming on as well as not being able to breathe. Pt has noticed she is not able to bend as well due to facial/head pain. Allergy profile 08/04/13- Total IgE 26.7, Negative Frontal sinus pain worse bending over. Eyes itch and run now in spring pollen season. Shifting nasal congestion. Benadryl at bedtime and azelastine nasal spray.  03/29/15- 43 yoF never smoker followed for allergic rhinitis, complicated by OSA Self referral-pt had sleep study about 3 yrs at [facility] and was given CPAP-not using due to mask issues. DME is [facility] she is asking to have [place] take over management of her obstructive sleep apnea as a new problem for [place]. NPSG 03/16/13 A[facility] 14.4/ hr, weight 242 lbs, CPAP titrated to 8 at [facility]  Original sleep evaluation was for problem of migraine  headache. Nasal pillows mask irritated her nostrils and the airflow was uncomfortable so she stopped wearing it. Bedtime 11 PM, up around 6 AM. Short sleep latency. Waking 2 or 3 times during the night. Weight gain 15-20 pounds since the original sleep study done. 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 Dymista nasal spray helps but she dislikes the taste. Lives alone with nobody to report sleep problems.  ROS-see HPI Constitutional:   No-   weight loss, night sweats, fevers, chills, +fatigue, lassitude. HEENT:   + headaches, No-difficulty swallowing, tooth/dental problems, sore throat,       +sneezing, +itching, ear ache, +nasal congestion, +post nasal drip,  CV:  No-   chest pain, orthopnea, PND, swelling in lower extremities, anasarca, dizziness, palpitations Resp: No-   shortness of breath with exertion or at rest.              No-   productive cough,  No non-productive cough,  No- coughing up of blood.              No-   change in color of mucus.  No- wheezing.   Skin: No-   rash or lesions. GI:  No-   heartburn, indigestion, abdominal pain, nausea, vomiting,  GU:  MS:  No-   joint pain or swelling.   Neuro-     nothing unusual Psych:  No- change in mood or affect. No depression or anxiety.  No memory loss.  OBJ- Physical Exam General- Alert, Oriented, Affect-appropriate, Distress- none acute.  obese Skin- rash-none, lesions- none, excoriation- none.  Lymphadenopathy- none Head- atraumatic            Eyes- Gross  vision intact, PERRLA, conjunctivae and secretions clear            Ears- Hearing, canals-normal            Nose- + turbinate edema, sniffing, no-Septal dev, mucus, polyps, erosion, perforation             Throat- Mallampati III-IV , mucosa clear , drainage- none, tonsils- atrophic Neck- flexible , trachea midline, no stridor , thyroid nl, carotid no bruit Chest - symmetrical excursion , unlabored            Heart/CV- RRR , no murmur , no gallop  , no rub, nl s1 s2                           - JVD- none , edema- none, stasis changes- none, varices- none           Lung- clear to P&A, wheeze- none, cough- none , dullness-none, rub- none           Chest wall-  Abd-  Br/ Gen/ Rectal- Not done, not indicated Extrem- cyanosis- none, clubbing, none, atrophy- none, strength- nl Neuro- grossly intact to observation

## 2015-04-01 NOTE — Assessment & Plan Note (Signed)
She disliked her initial experience with nasal pillows CPAP mask. It sounds as if the pillows were not sized correctly for her nostrils but also the airflow objective was unpleasant because of her underlying rhinitis. She would probably do better with a nasal or full face mask. This was discussed. She would prefer to try management with an oral appliance. Plan-refer for consideration of an oral appliance to manage obstructive sleep apnea

## 2015-04-01 NOTE — Assessment & Plan Note (Signed)
Dymista nasal spray was helpful but she disliked the taste. Plan-try regular use of Flonase supplemented with an antihistamine if needed.

## 2015-04-02 ENCOUNTER — Ambulatory Visit: Payer: 59 | Admitting: Internal Medicine

## 2015-05-14 ENCOUNTER — Encounter: Payer: Self-pay | Admitting: *Deleted

## 2015-05-14 ENCOUNTER — Ambulatory Visit: Payer: 59 | Admitting: Family Medicine

## 2015-05-16 ENCOUNTER — Other Ambulatory Visit: Payer: Self-pay | Admitting: General Surgery

## 2015-05-16 DIAGNOSIS — R7303 Prediabetes: Secondary | ICD-10-CM

## 2015-06-14 ENCOUNTER — Encounter: Payer: 59 | Attending: General Surgery | Admitting: Dietician

## 2015-06-14 ENCOUNTER — Encounter: Payer: Self-pay | Admitting: Dietician

## 2015-06-14 VITALS — Ht 64.75 in | Wt 244.4 lb

## 2015-06-14 DIAGNOSIS — Z6841 Body Mass Index (BMI) 40.0 and over, adult: Secondary | ICD-10-CM | POA: Insufficient documentation

## 2015-06-14 DIAGNOSIS — E669 Obesity, unspecified: Secondary | ICD-10-CM | POA: Insufficient documentation

## 2015-06-14 DIAGNOSIS — Z713 Dietary counseling and surveillance: Secondary | ICD-10-CM | POA: Diagnosis not present

## 2015-06-14 NOTE — Progress Notes (Signed)
  Pre-Op Assessment Visit:  Pre-Operative Sleeve Gastrectomy Surgery  Medical Nutrition Therapy:  Appt start time: 1400   End time:  1430.  Patient was seen on 06/14/2015 for Pre-Operative Nutrition Assessment. Assessment and letter of approval faxed to [facility] coordinator on 06/14/2015.   Preferred Learning Style:   No preference indicated   Learning Readiness:   Ready  Handouts given during visit include:  Pre-Op Goals Bariatric Surgery Protein Shakes   During the appointment today the following Pre-Op Goals were reviewed with the patient: Maintain or lose weight as instructed by your surgeon Make healthy food choices Begin to limit portion sizes Limited concentrated sugars and fried foods Keep fat/sugar in the single digits per serving on   food labels Practice CHEWING your food  (aim for 30 chews per bite or until applesauce consistency) Practice not drinking 15 minutes before, during, and 30 minutes after each meal/snack Avoid all carbonated beverages  Avoid/limit caffeinated beverages  Avoid all sugar-sweetened beverages Consume 3 meals per day; eat every 3-5 hours Make a list of non-food related activities Aim for 64-100 ounces of FLUID daily  Aim for at least 60-80 grams of PROTEIN daily Look for a liquid protein source that contain ?15 g protein and ?5 g carbohydrate  (ex: shakes, drinks, shots)  Patient-Centered Goals: Goals: overall healthier, better skin, not get tired on stairs  8 level of confidence, 10 level of importance  Demonstrated degree of understanding via:  Teach Back  Teaching Method Utilized:  Visual Auditory Hands on  Barriers to learning/adherence to lifestyle change: none  Patient to call the [facility] to enroll in Pre-Op and Post-Op Nutrition Education when surgery date is scheduled.

## 2015-06-14 NOTE — Patient Instructions (Signed)

## 2015-07-03 ENCOUNTER — Ambulatory Visit (INDEPENDENT_AMBULATORY_CARE_PROVIDER_SITE_OTHER): Payer: 59 | Admitting: Neurology

## 2015-07-03 ENCOUNTER — Ambulatory Visit (INDEPENDENT_AMBULATORY_CARE_PROVIDER_SITE_OTHER): Payer: 59 | Admitting: Internal Medicine

## 2015-07-03 ENCOUNTER — Encounter: Payer: Self-pay | Admitting: Internal Medicine

## 2015-07-03 ENCOUNTER — Encounter: Payer: Self-pay | Admitting: Neurology

## 2015-07-03 ENCOUNTER — Ambulatory Visit: Payer: 59 | Admitting: Neurology

## 2015-07-03 VITALS — BP 132/82 | HR 99 | Ht 64.0 in | Wt 247.5 lb

## 2015-07-03 VITALS — BP 120/80 | HR 78 | Ht 65.0 in | Wt 244.8 lb

## 2015-07-03 DIAGNOSIS — G4733 Obstructive sleep apnea (adult) (pediatric): Secondary | ICD-10-CM | POA: Diagnosis not present

## 2015-07-03 DIAGNOSIS — J309 Allergic rhinitis, unspecified: Secondary | ICD-10-CM

## 2015-07-03 DIAGNOSIS — G43019 Migraine without aura, intractable, without status migrainosus: Secondary | ICD-10-CM

## 2015-07-03 DIAGNOSIS — J3089 Other allergic rhinitis: Secondary | ICD-10-CM

## 2015-07-03 DIAGNOSIS — J302 Other seasonal allergic rhinitis: Secondary | ICD-10-CM

## 2015-07-03 MED ORDER — METOPROLOL SUCCINATE ER 25 MG PO TB24: 25.0000 mg | ORAL_TABLET | Freq: Every day | ORAL | Status: DC

## 2015-07-03 NOTE — Patient Instructions (Signed)
Suggest you call Dr Ron Parker' office and ask status of the insurance application for getting an oral appliance for your sleep apnea.  Sample Qnasl nasal spray     1-2 puffs each nostril once daily at bedtime during allergy season, while needed  Consider trying an otc nasal saline gel to soothe the inside of your nose, as discussed  Please call as needed

## 2015-07-03 NOTE — Assessment & Plan Note (Signed)
Some dry discomfort high in her nose began with the season change. It may be mostly vasomotor/dryness but a seasonal allergic component is not excluded. She is particular about what nose sprays she can tolerate. Plan-sample Qnasl for trial. Can also try saline nasal spray or gel as discussed.

## 2015-07-03 NOTE — Patient Instructions (Addendum)
   We will stop the propranolol and change to Toprol 25 mg at night.  Migraine Headache A migraine headache is an intense, throbbing pain on one or both sides of your head. A migraine can last for 30 minutes to several hours. CAUSES  The exact cause of a migraine headache is not always known. However, a migraine may be caused when nerves in the brain become irritated and release chemicals that cause inflammation. This causes pain. Certain things may also trigger migraines, such as:  Alcohol.  Smoking.  Stress.  Menstruation.  Aged cheeses.  Foods or drinks that contain nitrates, glutamate, aspartame, or tyramine.  Lack of sleep.  Chocolate.  Caffeine.  Hunger.  Physical exertion.  Fatigue.  Medicines used to treat chest pain (nitroglycerine), birth control pills, estrogen, and some blood pressure medicines. SIGNS AND SYMPTOMS  Pain on one or both sides of your head.  Pulsating or throbbing pain.  Severe pain that prevents daily activities.  Pain that is aggravated by any physical activity.  Nausea, vomiting, or both.  Dizziness.  Pain with exposure to bright lights, loud noises, or activity.  General sensitivity to bright lights, loud noises, or smells. Before you get a migraine, you may get warning signs that a migraine is coming (aura). An aura may include:  Seeing flashing lights.  Seeing bright spots, halos, or zigzag lines.  Having tunnel vision or blurred vision.  Having feelings of numbness or tingling.  Having trouble talking.  Having muscle weakness. DIAGNOSIS  A migraine headache is often diagnosed based on:  Symptoms.  Physical exam.  A CT scan or MRI of your head. These imaging tests cannot diagnose migraines, but they can help rule out other causes of headaches. TREATMENT Medicines may be given for pain and nausea. Medicines can also be given to help prevent recurrent migraines.  HOME CARE INSTRUCTIONS  Only take  over-the-counter or prescription medicines for pain or discomfort as directed by your health care provider. The use of long-term narcotics is not recommended.  Lie down in a dark, quiet room when you have a migraine.  Keep a journal to find out what may trigger your migraine headaches. For example, write down:  What you eat and drink.  How much sleep you get.  Any change to your diet or medicines.  Limit alcohol consumption.  Quit smoking if you smoke.  Get 7-9 hours of sleep, or as recommended by your health care provider.  Limit stress.  Keep lights dim if bright lights bother you and make your migraines worse. SEEK IMMEDIATE MEDICAL CARE IF:   Your migraine becomes severe.  You have a fever.  You have a stiff neck.  You have vision loss.  You have muscular weakness or loss of muscle control.  You start losing your balance or have trouble walking.  You feel faint or pass out.  You have severe symptoms that are different from your first symptoms. MAKE SURE YOU:   Understand these instructions.  Will watch your condition.  Will get help right away if you are not doing well or get worse. Document Released: 10/06/2005 Document Revised: 02/20/2014 Document Reviewed: 06/13/2013 [facility] Patient Information 2015 [place], [place]. This information is not intended to replace advice given to you by your health care provider. Make sure you discuss any questions you have with your health care provider.

## 2015-07-03 NOTE — Assessment & Plan Note (Signed)
Did not tolerate CPAP. I encouraged her to call Dr. Ron Parker office to see where they are with insurance application. Encourage weight loss.

## 2015-07-03 NOTE — Progress Notes (Signed)
Reason for visit: Migraine headache  Kaylee Montgomery is an 34 y.o. female  History of present illness:  Kaylee Montgomery is a 34 year old right-handed black female with a history of common migraine. She has been having daily migraine headaches since 2012. The patient was placed on relatively low-dose propranolol, the patient is only on 20 mg once a day at this time. She indicates that the headaches have significant improved, the sharp headaches are much more rare, and she is having only 16 headache days a month at this time. She will have 2 severe headaches a month, she was given a prescription for Imitrex, but the patient does not recall getting this prescription. She will take Advil, 800 mg for the headache which seems to be helpful. She indicates that odors such as perfumes and exercise may be activators for her headache. Red food dye and MSG are also activators. She is pleased with her progress so far. She comes into the office today for an evaluation.  Past Medical History  Diagnosis Date  . Pituitary adenoma   . Gynecomastia   . ALLERGIC RHINITIS   . Sinusitis   . Obesity   . Headache(784.0)   . Hypertension   . Sleep apnea   . Common migraine with intractable migraine 02/23/2015    Past Surgical History  Procedure Laterality Date  . Caesarean    . Tonsillectomy      Family History  Problem Relation Age of Onset  . Diabetes Maternal Grandfather   . Heart disease Father     stent  . Migraines Father   . Migraines Son     Social history:  reports that she has never smoked. She has never used smokeless tobacco. She reports that she drinks alcohol. She reports that she does not use illicit drugs.    Allergies  Allergen Reactions  . Bromocriptine Mesylate     REACTION: nausea  . Depo-Medrol [Methylprednisolone Acetate]     Reports itching and redness at the injection site  . Imitrex [Sumatriptan] Other (See Comments)    Severe upset stomach, nausea feelings     Medications:  Prior to Admission medications   Medication Sig Start Date End Date Taking? Authorizing Provider  Beclomethasone Dipropionate 80 MCG/ACT AERS Place 1-2 puffs into the nose at bedtime.   Yes Historical Provider, MD  diphenhydrAMINE (BENADRYL) 25 mg capsule Take 25 mg by mouth at bedtime.    Yes Historical Provider, MD  Multiple Vitamin (MULTIVITAMIN) tablet Take 1 tablet by mouth daily.   Yes Historical Provider, MD  triamterene-hydrochlorothiazide (MAXZIDE-25) 37.5-25 MG per tablet TAKE 1 TABLET BY MOUTH ONCE A DAY 11/27/14  Yes [name], MD  metoprolol succinate (TOPROL XL) 25 MG 24 hr tablet Take 1 tablet (25 mg total) by mouth daily. 07/03/15   Kathrynn Ducking, MD    ROS:  Out of a complete 14 system review of symptoms, the patient complains only of the following symptoms, and all other reviewed systems are negative.  Headache  Blood pressure 132/82, pulse 99, height 5\' 4"  (1.626 m), weight 247 lb 8 oz (112.265 kg).  Physical Exam  General: The patient is alert and cooperative at the time of the examination. The patient is markedly obese.  Skin: No significant peripheral edema is noted.   Neurologic Exam  Mental status: The patient is alert and oriented x 3 at the time of the examination. The patient has apparent normal recent and remote memory, with an apparently normal  attention span and concentration ability.   Cranial nerves: Facial symmetry is present. Speech is normal, no aphasia or dysarthria is noted. Extraocular movements are full. Visual fields are full.  Motor: The patient has good strength in all 4 extremities.  Sensory examination: Soft touch sensation is symmetric on the face, arms, and legs.  Coordination: The patient has good finger-nose-finger and heel-to-shin bilaterally.  Gait and station: The patient has a normal gait. Tandem gait is normal. Romberg is negative. No drift is seen.  Reflexes: Deep tendon reflexes are  symmetric.   Assessment/Plan:  1. Common migraine headache  The patient has had a good response to a relatively low dose of propranolol, increased doses seemed to result in some dizziness with standing. The patient will be switched to low-dose Toprol, taking the 25 mg tablet at nighttime. The patient will contact me if she wants a prescription for the Imitrex. Otherwise, she will follow-up in 4-6 months.  Kaylee Alexanders MD 07/03/2015 7:13 PM  [facility] [address] [place], [place] [address]  Phone [phone] Fax [phone]

## 2015-07-03 NOTE — Progress Notes (Signed)
08/04/13 31 yoF never smoker referred by Dr Moshe Cipro  Asks allergy evaluation because of frequent headaches affected by weather and environment. For 5 years she reports postnasal drip which is no longer seasonal. Sneezing, nasal congestion, itching of eyes. Made worse by indoor heat, seasonal pollens , grass being mowed, strong fragrances. denies cough or wheeze. No benefit from nasal sprays or antihistamines. Allergy skin tested years ago without allergy vaccine. Past surgery for tonsillectomy. A surgical complication required a second procedure for repair. Not diagnosed with asthma.  09/27/13- 47 yoF never smoker followed for allergic rhinitis, complicated by OSA(Neurology manages) FOLLOWS FOR: continues to have flare ups; review labs in detail with patient. Sneezing and some retro-orbital/nasal pressure. Like Qnasl better- no drainage. Says sleep study was positive for sleep apnea by [facility]. Neurology is managing.  01/25/15- 30 yoF never smoker followed for allergic rhinitis, complicated by OSA(Neurology manages) FOLLOWS FOR: per Dr Moshe Cipro; allergies getting worse-walking outdoors and has noticed migraines coming on as well as not being able to breathe. Pt has noticed she is not able to bend as well due to facial/head pain. Allergy profile 08/04/13- Total IgE 26.7, Negative Frontal sinus pain worse bending over. Eyes itch and run now in spring pollen season. Shifting nasal congestion. Benadryl at bedtime and azelastine nasal spray.  03/29/15- 25 yoF never smoker followed for allergic rhinitis, complicated by OSA Self referral-pt had sleep study about 3 yrs at [facility] and was given CPAP-not using due to mask issues. DME is [facility] she is asking to have [place] take over management of her obstructive sleep apnea as a new problem for [place]. NPSG 03/16/13 AHI 14.4/ hr, weight 242 lbs, CPAP titrated to 8 at [facility]  Original sleep evaluation was for problem of migraine  headache. Nasal pillows mask irritated her nostrils and the airflow was uncomfortable so she stopped wearing it. Bedtime 11 PM, up around 6 AM. Short sleep latency. Waking 2 or 3 times during the night. Weight gain 15-20 pounds since the original sleep study done. 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 Dymista nasal spray helps but she dislikes the taste. Lives alone with nobody to report sleep problems.  07/03/15- 64 yoF never smoker followed for allergic rhinitis,  OSA, complicated by HBP, obesity, migraine, hx pituitary adenoma Follows For: pt states her sinus has been bothering her for 1 week. pt has a lot of pain in the nose, burning and pressure and tender to the touch. pt states she was fitted for a new oral appliance but has not received it yet and has not been using the CPAP. pt states she has been sleeping ok without CPAP. DME: [facility]. She is waiting to hear if insurance will cover oral appliance from Dr. Ron Parker. Plans flu vaccine at work Bridge of nose is tender, not stuffy and no drainage now. She dislikes the sense of drainage into her throat from azelastine and Flonase nasal sprays and preferred dry nasal spray(Qnasl).   ROS-see HPI Constitutional:   No-   weight loss, night sweats, fevers, chills, +fatigue, lassitude. HEENT:   + headaches, No-difficulty swallowing, tooth/dental problems, sore throat,       +sneezing, +itching, ear ache, +nasal congestion, +post nasal drip,  CV:  No-   chest pain, orthopnea, PND, swelling in lower extremities, anasarca, dizziness, palpitations Resp: No-   shortness of breath with exertion or at rest.  No-   productive cough,  No non-productive cough,  No- coughing up of blood.              No-   change in color of mucus.  No- wheezing.   Skin: No-   rash or lesions. GI:  No-   heartburn, indigestion, abdominal pain, nausea, vomiting,  GU:  MS:  No-   joint pain or swelling.    Neuro-     nothing unusual Psych:  No- change in mood or affect. No depression or anxiety.  No memory loss.  OBJ- Physical Exam General- Alert, Oriented, Affect-appropriate, Distress- none acute.  + obese Skin- rash-none, lesions- none, excoriation- none.  Lymphadenopathy- none Head- atraumatic            Eyes- Gross vision intact, PERRLA, conjunctivae and secretions clear            Ears- Hearing, canals-normal            Nose- + turbinate edema, , no-Septal dev, mucus, polyps, erosion, perforation             Throat- Mallampati III-IV , mucosa clear , drainage- none, tonsils- atrophic Neck- flexible , trachea midline, no stridor , thyroid nl, carotid no bruit Chest - symmetrical excursion , unlabored           Heart/CV- RRR , no murmur , no gallop  , no rub, nl s1 s2                           - JVD- none , edema- none, stasis changes- none, varices- none           Lung- clear to P&A, wheeze- none, cough- none , dullness-none, rub- none           Chest wall-  Abd-  Br/ Gen/ Rectal- Not done, not indicated Extrem- cyanosis- none, clubbing, none, atrophy- none, strength- nl Neuro- grossly intact to observation

## 2015-07-17 ENCOUNTER — Encounter: Payer: Self-pay | Admitting: Dietician

## 2015-07-17 ENCOUNTER — Encounter: Payer: 59 | Attending: General Surgery | Admitting: Dietician

## 2015-07-17 VITALS — Ht 64.0 in | Wt 241.6 lb

## 2015-07-17 DIAGNOSIS — E669 Obesity, unspecified: Secondary | ICD-10-CM | POA: Diagnosis present

## 2015-07-17 DIAGNOSIS — Z6841 Body Mass Index (BMI) 40.0 and over, adult: Secondary | ICD-10-CM | POA: Insufficient documentation

## 2015-07-17 DIAGNOSIS — Z713 Dietary counseling and surveillance: Secondary | ICD-10-CM | POA: Insufficient documentation

## 2015-07-17 NOTE — Patient Instructions (Addendum)
Continue to cut back on soda (tried coffee with splenda/equal/stevia). Continue walking 3-4 x week, try increasing pace. Continue to work on portion sizes. Continue working chewing well. Start working on not drinking 15 minutes before, during, and up through 30 minutes after eating.

## 2015-07-17 NOTE — Progress Notes (Signed)
  6 Months Supervised Weight Loss Visit:   Pre-Operative Sleeve Gastrectomy Surgery  Medical Nutrition Therapy:  Appt start time: 1020 end time:  3833.  Primary concerns today: Supervised Weight Loss Visit. Returns with a 3 lbs weight loss since last month. Has been working on chewing well, eating slowly, cutting back on soda. Has been doing smaller portions. Eating 3 meals per day.   Had a few deaths in the family which she thinks set her back a bit. Has a family reunion coming up. 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   Tried spin class but she was really out of breath.   241.6 BMI: 41.5  Preferred Learning Style:   No preference indicated   Learning Readiness:   Ready  Recent physical activity:  Walking 3-4 x week for 30-45 minutes   Progress Towards Goal(s):  In progress.  Handouts given during visit include:  none   Nutritional Diagnosis:  [place]-3.3 Obesity related to past poor dietary habits and physical inactivity as evidenced by patient attending supervised weight loss for insurance approval of bariatric surgery.    Intervention:  Nutrition counseling provided.  Teaching Method Utilized:  Visual Auditory Hands on  Barriers to learning/adherence to lifestyle change: none  Demonstrated degree of understanding via:  Teach Back   Monitoring/Evaluation:  Dietary intake, exercise, and body weight. Follow up in 1 months for 6 month supervised weight loss visit.

## 2015-08-09 ENCOUNTER — Encounter: Payer: 59 | Attending: General Surgery | Admitting: Dietician

## 2015-08-09 ENCOUNTER — Encounter: Payer: Self-pay | Admitting: Dietician

## 2015-08-09 VITALS — Ht 64.0 in | Wt 244.6 lb

## 2015-08-09 DIAGNOSIS — Z713 Dietary counseling and surveillance: Secondary | ICD-10-CM | POA: Insufficient documentation

## 2015-08-09 DIAGNOSIS — Z6841 Body Mass Index (BMI) 40.0 and over, adult: Secondary | ICD-10-CM | POA: Diagnosis not present

## 2015-08-09 DIAGNOSIS — E669 Obesity, unspecified: Secondary | ICD-10-CM | POA: Insufficient documentation

## 2015-08-09 NOTE — Progress Notes (Signed)
  6 Months Supervised Weight Loss Visit:   Pre-Operative Sleeve Gastrectomy Surgery  Medical Nutrition Therapy:  Appt start time: 5208 end time:  1140  Primary concerns today: Supervised Weight Loss Visit #2. Returns with a 3 lbs weight gain since last month. Had more deaths in the family and has been stressed out. Tried decaf coffee. Has not been able to walk as much since she has been busy with her second job. Trying to get back into regular walking. Still  working on chewing well, eating slowly, cutting back on soda. Feels like she is eating less than before. Eating about 2 meals per day and sometimes will have snacks (apples with peanut butter sometimes). Started to work on not drinking during meals.   Weight: 244.6 BMI: 42.0  Preferred Learning Style:   No preference indicated   Learning Readiness:   Ready  Recent physical activity:  Walking 3-4 x week for 30-45 minutes   Progress Towards Goal(s):  In progress.  Handouts given during visit include:  none   Nutritional Diagnosis:  [place]-3.3 Obesity related to past poor dietary habits and physical inactivity as evidenced by patient attending supervised weight loss for insurance approval of bariatric surgery.    Intervention:  Nutrition counseling provided.  Teaching Method Utilized:  Visual Auditory Hands on  Barriers to learning/adherence to lifestyle change: none  Demonstrated degree of understanding via:  Teach Back   Monitoring/Evaluation:  Dietary intake, exercise, and body weight. Follow up in 1 months for 6 month supervised weight loss visit.

## 2015-08-09 NOTE — Patient Instructions (Addendum)
Continue to cut back on soda and having decaf coffee. Plan to walk 3-4 x week, try increasing pace. Get back to having 3 meals per day.  Continue working chewing well. Continue working on not drinking 15 minutes before, during, and up through 30 minutes after eating.

## 2015-09-10 ENCOUNTER — Encounter: Payer: 59 | Attending: General Surgery | Admitting: Dietician

## 2015-09-10 ENCOUNTER — Encounter: Payer: Self-pay | Admitting: Dietician

## 2015-09-10 VITALS — Ht 64.0 in | Wt 246.5 lb

## 2015-09-10 DIAGNOSIS — Z6841 Body Mass Index (BMI) 40.0 and over, adult: Secondary | ICD-10-CM | POA: Diagnosis not present

## 2015-09-10 DIAGNOSIS — E669 Obesity, unspecified: Secondary | ICD-10-CM | POA: Diagnosis present

## 2015-09-10 DIAGNOSIS — Z713 Dietary counseling and surveillance: Secondary | ICD-10-CM | POA: Insufficient documentation

## 2015-09-10 NOTE — Progress Notes (Signed)
Supervised Weight Loss Class:  Appt start time: 1600   End time:  1630.  Patient was seen on 09/10/2015 for the "Mindful Eating" Supervised Weight Loss Class at [facility].   Surgery type: Sleeve Start weight at [facility]: 241 lbs Weight today: 246.5 lbs Weight change: 1.9 lbs gain   The following learning objectives were met by the patient during this class:  Objectives: -Discuss masticating foods to appropriate consistency  -Helps food fit in pouch and prevents pain and vomiting  -Eating slowly and taking tiny bites help brain register fullness -Encourage patients to begin to think about what diet will be like after surgery -Encourage patients to begin to consider portion sizes and listen to hunger/fullness cues -Review importance of regular meals and snacks -Discuss diet advancement after surgery -Review causes and symptoms of dumping syndrome  Goals: -Begin limiting fried and other high fat foods and foods high in sugar -Eat proteins first, then vegetables, then starch -Practice chewing foods to applesauce consistency -Aim to spend 20 minutes eating meals -Work on eating 3 meals a day (or every 3-5 hours) if you are not already doing so  Handouts given: Mindful Eating brochure

## 2015-09-11 ENCOUNTER — Ambulatory Visit: Payer: 59 | Admitting: Dietician

## 2015-09-20 ENCOUNTER — Telehealth: Payer: Self-pay | Admitting: Family Medicine

## 2015-09-20 ENCOUNTER — Other Ambulatory Visit: Payer: Self-pay | Admitting: Family Medicine

## 2015-09-20 MED ORDER — TRIAMTERENE-HCTZ 37.5-25 MG PO TABS: 1.0000 | ORAL_TABLET | Freq: Every day | ORAL | Status: DC

## 2015-09-20 NOTE — Telephone Encounter (Signed)
Patient notified

## 2015-09-20 NOTE — Telephone Encounter (Signed)
Patient is scheduled for January 9th but she is asking for a medication refill on triamterene-hydrochlorothiazide (MAXZIDE-25) 37.5-25 MG per tablet  Before then, she states that she will be going on a cruise the 2nd week in Dec and she was also asking for something for motion sickness, please advise?

## 2015-09-20 NOTE — Telephone Encounter (Signed)
Medication sent to her pharmacy, please let her know

## 2015-09-21 ENCOUNTER — Telehealth: Payer: Self-pay | Admitting: Family Medicine

## 2015-09-21 NOTE — Telephone Encounter (Signed)
Pls let pt know that I see that she had an inquiry as to if there is "a shot" available for motion sickness as she will be on a cruise. Pls let her know none is available, offer trans scopolamine patch, let me know if she wants this, I will send to her pharmacy. Thanks

## 2015-09-25 NOTE — Telephone Encounter (Signed)
Patient aware and appreciative.  Would like to come in for appt and get rx at this time.

## 2015-09-27 ENCOUNTER — Ambulatory Visit (INDEPENDENT_AMBULATORY_CARE_PROVIDER_SITE_OTHER): Payer: 59 | Admitting: Family Medicine

## 2015-09-27 ENCOUNTER — Encounter: Payer: Self-pay | Admitting: Family Medicine

## 2015-09-27 VITALS — BP 118/78 | HR 94 | Resp 16 | Ht 64.0 in | Wt 244.0 lb

## 2015-09-27 DIAGNOSIS — F411 Generalized anxiety disorder: Secondary | ICD-10-CM

## 2015-09-27 DIAGNOSIS — I10 Essential (primary) hypertension: Secondary | ICD-10-CM | POA: Diagnosis not present

## 2015-09-27 DIAGNOSIS — R7303 Prediabetes: Secondary | ICD-10-CM

## 2015-09-27 DIAGNOSIS — E559 Vitamin D deficiency, unspecified: Secondary | ICD-10-CM

## 2015-09-27 DIAGNOSIS — Z1159 Encounter for screening for other viral diseases: Secondary | ICD-10-CM | POA: Diagnosis not present

## 2015-09-27 DIAGNOSIS — T753XXA Motion sickness, initial encounter: Secondary | ICD-10-CM

## 2015-09-27 DIAGNOSIS — B958 Unspecified staphylococcus as the cause of diseases classified elsewhere: Secondary | ICD-10-CM

## 2015-09-27 DIAGNOSIS — L089 Local infection of the skin and subcutaneous tissue, unspecified: Secondary | ICD-10-CM

## 2015-09-27 MED ORDER — SCOPOLAMINE 1 MG/3DAYS TD PT72: 1.0000 | MEDICATED_PATCH | TRANSDERMAL | Status: DC

## 2015-09-27 MED ORDER — BUSPIRONE HCL 5 MG PO TABS: 5.0000 mg | ORAL_TABLET | Freq: Three times a day (TID) | ORAL | Status: DC

## 2015-09-27 MED ORDER — MUPIROCIN 2 % EX OINT: 1.0000 "application " | TOPICAL_OINTMENT | Freq: Two times a day (BID) | CUTANEOUS | Status: DC

## 2015-09-27 NOTE — Patient Instructions (Signed)
F/u in 3 month, call if you need me sooner  New for anxiety is buspar 3 times daily  Transcopalimne ordered for motion sickness  Complete antibiotic tablets prescribed for boil  New to use when you start getting skininfection is bactroban ointment twice daily for 3 to 5 days    Fasting lipid, cmp , HBA1C, TSH , vit D and HIV in 3 month It is important that you exercise regularly at least 30 minutes 5 times a week. If you develop chest pain, have severe difficulty breathing, or feel very tired, stop exercising immediately and seek medical attention    Thanks for choosing [facility], we consider it a privelige to serve you.  All the best

## 2015-10-08 ENCOUNTER — Encounter: Payer: 59 | Attending: General Surgery | Admitting: Dietician

## 2015-10-08 VITALS — Ht 64.0 in | Wt 244.3 lb

## 2015-10-08 DIAGNOSIS — Z6841 Body Mass Index (BMI) 40.0 and over, adult: Secondary | ICD-10-CM | POA: Insufficient documentation

## 2015-10-08 DIAGNOSIS — Z713 Dietary counseling and surveillance: Secondary | ICD-10-CM | POA: Insufficient documentation

## 2015-10-08 DIAGNOSIS — E669 Obesity, unspecified: Secondary | ICD-10-CM | POA: Diagnosis present

## 2015-10-09 ENCOUNTER — Encounter: Payer: Self-pay | Admitting: Dietician

## 2015-10-09 NOTE — Progress Notes (Signed)
Supervised Weight Loss Class:  Appt start time: 1600   End time:  1630.  Patient was seen on 10/08/2015 for the "Protein" Supervised Weight Loss Class at the [facility].   Surgery type: sleeve gastrectomy Start weight at [facility]: 244 lbs on 06/14/15 Weight today: 244.3 lbs Weight change: 0 lbs   The following learning objectives were met by the patient during this class:  Objectives: -Discuss importance of protein after surgery  -Promotes healing  -Helps preserve lean mass (muscles and organs) as you lose fat -Identify foods that contain protein -Discuss recommended daily protein goals for men and women after surgery -Educate patients on body composition scale and purpose   Goals: -Begin to be mindful of how much protein is in the foods you are eating -Know how much protein is recommended daily after surgery -Begin having a protein food with each meal and snack  Handouts given: "Protein" brochure

## 2015-10-15 ENCOUNTER — Encounter: Payer: Self-pay | Admitting: Family Medicine

## 2015-10-15 DIAGNOSIS — B958 Unspecified staphylococcus as the cause of diseases classified elsewhere: Secondary | ICD-10-CM | POA: Insufficient documentation

## 2015-10-15 DIAGNOSIS — L089 Local infection of the skin and subcutaneous tissue, unspecified: Secondary | ICD-10-CM | POA: Insufficient documentation

## 2015-10-15 DIAGNOSIS — F411 Generalized anxiety disorder: Secondary | ICD-10-CM | POA: Insufficient documentation

## 2015-10-15 DIAGNOSIS — T753XXA Motion sickness, initial encounter: Secondary | ICD-10-CM | POA: Insufficient documentation

## 2015-10-15 NOTE — Progress Notes (Signed)
Subjective:    Patient ID: Kaylee Montgomery, female    DOB: 03/21/1981, 34 y.o.   MRN: [id number]  HPI   Kaylee Montgomery     MRN: [id number]      DOB: [birth year]   HPI Kaylee Montgomery is here for follow up and re-evaluation of chronic medical conditions, medication management and review of any available recent lab and radiology data.  Preventive health is updated, specifically  Cancer screening and Immunization.   Questions or concerns regarding consultations or procedures which the PT has had in the interim are  addressed. The PT denies any adverse reactions to current medications since the last visit.  Incompletely treated boil on inner thigh which has since recurred, concerned about recurrences C/o increased anxiety , wants help with this, negative depression screen Working on weight , considering surgery.  Has upcoming cruise , wants medication for motion sicknesss  ROS Denies recent fever or chills. Denies sinus pressure, nasal congestion, ear pain or sore throat. Denies chest congestion, productive cough or wheezing. Denies chest pains, palpitations and leg swelling Denies abdominal pain, nausea, vomiting,diarrhea or constipation.   Denies dysuria, frequency, hesitancy or incontinence. Denies joint pain, swelling and limitation in mobility. Denies headaches, seizures, numbness, or tingling.    PE  BP 118/78 mmHg  Pulse 94  Resp 16  Ht 5\' 4"  (1.626 m)  Wt 244 lb (110.678 kg)  BMI 41.86 kg/m2  SpO2 96%  Patient alert and oriented and in no cardiopulmonary distress.  HEENT: No facial asymmetry, EOMI,   oropharynx pink and moist.  Neck supple no JVD, no mass.  Chest: Clear to auscultation bilaterally.  CVS: S1, S2 no murmurs, no S3.Regular rate.  ABD: Soft non tender.   Ext: No edema  MS: Adequate ROM spine, shoulders, hips and knees.  Skin: cellulitis with small draining  abscess on left inner thigh, diameter of involved area approx 4cm  Psych: Good eye  contact, normal affect. Memory intact  anxious not depressed appearing.  CNS: CN 2-12 intact, power,  normal throughout.no focal deficits noted.   Assessment & Plan   Essential hypertension Controlled, no change in medication DASH diet and commitment to daily physical activity for a minimum of 30 minutes discussed and encouraged, as a part of hypertension management. The importance of attaining a healthy weight is also discussed.  BP/Weight 10/08/2015 09/27/2015 09/10/2015 08/09/2015 07/17/2015 07/03/2015 [id number]  Systolic BP - 123456 - - - Q000111Q 123456  Diastolic BP - 78 - - - 82 80  Wt. (Lbs) 244.3 244 246.5 244.6 241.6 247.5 244.8  BMI 41.91 41.86 42.29 41.96 41.45 42.46 40.74        Prediabetes Patient educated about the importance of limiting  Carbohydrate intake , the need to commit to daily physical activity for a minimum of 30 minutes , and to commit weight loss. The fact that changes in all these areas will reduce or eliminate all together the development of diabetes is stressed.  Updated lab needed at/ before next visit.   Diabetic Labs Latest Ref Rng 01/10/2015 10/24/2013 04/26/2013 11/06/2011 03/15/2011  HbA1c <5.7 % 6.2(H) 6.2(H) 6.6(H) 5.8(H) 5.9(H)  Chol 0 - 200 mg/dL - 174 - - 187  HDL >39 mg/dL - 33(L) - - 38(L)  Calc LDL 0 - 99 mg/dL - 117(H) - - 136(H)  Triglycerides <150 mg/dL - 118 - - 65  Creatinine 0.50 - 1.10 mg/dL 0.59 0.88 0.76 - 0.59   BP/Weight 10/08/2015 09/27/2015 09/10/2015 08/09/2015 07/17/2015 07/03/2015  [id number]  Systolic BP - 123456 - - - Q000111Q 123456  Diastolic BP - 78 - - - 82 80  Wt. (Lbs) 244.3 244 246.5 244.6 241.6 247.5 244.8  BMI 41.91 41.86 42.29 41.96 41.45 42.46 40.74   No flowsheet data found.     Morbid obesity ([place]) Unchnaged Patient re-educated about  the importance of commitment to a  minimum of 150 minutes of exercise per week.  The importance of healthy food choices with portion control discussed. Encouraged to start a food diary, count  calories and to consider  joining a support group. Sample diet sheets offered. Goals set by the patient for the next several months.   Weight /BMI 10/08/2015 09/27/2015 09/10/2015  WEIGHT 244 lb 4.8 oz 244 lb 246 lb 8 oz  HEIGHT 5\' 4"  5\' 4"  5\' 4"   BMI 41.91 kg/m2 41.86 kg/m2 42.29 kg/m2    Current exercise per week 90 minutes.   Staph skin infection Recurrent/ incompletely treated staph infection of inner thigh, pt non compliant with regime , did not complete antibiotic course. Importance of same is discussed, also new will be topical antibiotic for prophylaxis early on in infection  Motion sickness Anticipated the need for medication during upcoming cruise, script for trans scopolamine provided and discussed appropriate use  GAD (generalized anxiety disorder) Requests need for GAD symptoms which are not habit forming, buspar started and techniques to deal with anxiety also discussed       Review of Systems     Objective:   Physical Exam        Assessment & Plan:

## 2015-10-15 NOTE — Assessment & Plan Note (Signed)
Requests need for GAD symptoms which are not habit forming, buspar started and techniques to deal with anxiety also discussed

## 2015-10-15 NOTE — Assessment & Plan Note (Signed)
Patient educated about the importance of limiting  Carbohydrate intake , the need to commit to daily physical activity for a minimum of 30 minutes , and to commit weight loss. The fact that changes in all these areas will reduce or eliminate all together the development of diabetes is stressed.  Updated lab needed at/ before next visit.   Diabetic Labs Latest Ref Rng 01/10/2015 10/24/2013 04/26/2013 11/06/2011 03/15/2011  HbA1c <5.7 % 6.2(H) 6.2(H) 6.6(H) 5.8(H) 5.9(H)  Chol 0 - 200 mg/dL - 174 - - 187  HDL >39 mg/dL - 33(L) - - 38(L)  Calc LDL 0 - 99 mg/dL - 117(H) - - 136(H)  Triglycerides <150 mg/dL - 118 - - 65  Creatinine 0.50 - 1.10 mg/dL 0.59 0.88 0.76 - 0.59   BP/Weight 10/08/2015 09/27/2015 09/10/2015 08/09/2015 07/17/2015 07/03/2015 [id number]  Systolic BP - 123456 - - - Q000111Q 123456  Diastolic BP - 78 - - - 82 80  Wt. (Lbs) 244.3 244 246.5 244.6 241.6 247.5 244.8  BMI 41.91 41.86 42.29 41.96 41.45 42.46 40.74   No flowsheet data found.

## 2015-10-15 NOTE — Assessment & Plan Note (Signed)
Anticipated the need for medication during upcoming cruise, script for trans scopolamine provided and discussed appropriate use

## 2015-10-15 NOTE — Assessment & Plan Note (Signed)
Controlled, no change in medication DASH diet and commitment to daily physical activity for a minimum of 30 minutes discussed and encouraged, as a part of hypertension management. The importance of attaining a healthy weight is also discussed.  BP/Weight 10/08/2015 09/27/2015 09/10/2015 08/09/2015 07/17/2015 07/03/2015 [id number]  Systolic BP - 123456 - - - Q000111Q 123456  Diastolic BP - 78 - - - 82 80  Wt. (Lbs) 244.3 244 246.5 244.6 241.6 247.5 244.8  BMI 41.91 41.86 42.29 41.96 41.45 42.46 40.74

## 2015-10-15 NOTE — Assessment & Plan Note (Signed)
Recurrent/ incompletely treated staph infection of inner thigh, pt non compliant with regime , did not complete antibiotic course. Importance of same is discussed, also new will be topical antibiotic for prophylaxis early on in infection

## 2015-10-15 NOTE — Assessment & Plan Note (Signed)
Unchnaged Patient re-educated about  the importance of commitment to a  minimum of 150 minutes of exercise per week.  The importance of healthy food choices with portion control discussed. Encouraged to start a food diary, count calories and to consider  joining a support group. Sample diet sheets offered. Goals set by the patient for the next several months.   Weight /BMI 10/08/2015 09/27/2015 09/10/2015  WEIGHT 244 lb 4.8 oz 244 lb 246 lb 8 oz  HEIGHT 5\' 4"  5\' 4"  5\' 4"   BMI 41.91 kg/m2 41.86 kg/m2 42.29 kg/m2    Current exercise per week 90 minutes.

## 2015-10-29 ENCOUNTER — Ambulatory Visit: Payer: 59 | Admitting: Family Medicine

## 2015-11-06 ENCOUNTER — Encounter: Payer: Self-pay | Admitting: Internal Medicine

## 2015-11-06 ENCOUNTER — Ambulatory Visit (INDEPENDENT_AMBULATORY_CARE_PROVIDER_SITE_OTHER): Payer: 59 | Admitting: Internal Medicine

## 2015-11-06 DIAGNOSIS — J309 Allergic rhinitis, unspecified: Secondary | ICD-10-CM

## 2015-11-06 DIAGNOSIS — G4733 Obstructive sleep apnea (adult) (pediatric): Secondary | ICD-10-CM | POA: Diagnosis not present

## 2015-11-06 DIAGNOSIS — J302 Other seasonal allergic rhinitis: Secondary | ICD-10-CM

## 2015-11-06 DIAGNOSIS — J3089 Other allergic rhinitis: Secondary | ICD-10-CM

## 2015-11-06 NOTE — Assessment & Plan Note (Signed)
Nonseasonal drainage, nonspecific Plan-sample Dymista nasal spray for trial with discussion

## 2015-11-06 NOTE — Progress Notes (Signed)
08/04/13 31 yoF never smoker referred by Dr Moshe Cipro  Asks allergy evaluation because of frequent headaches affected by weather and environment. For 5 years she reports postnasal drip which is no longer seasonal. Sneezing, nasal congestion, itching of eyes. Made worse by indoor heat, seasonal pollens , grass being mowed, strong fragrances. denies cough or wheeze. No benefit from nasal sprays or antihistamines. Allergy skin tested years ago without allergy vaccine. Past surgery for tonsillectomy. A surgical complication required a second procedure for repair. Not diagnosed with asthma.  09/27/13- 57 yoF never smoker followed for allergic rhinitis, complicated by OSA(Neurology manages) FOLLOWS FOR: continues to have flare ups; review labs in detail with patient. Sneezing and some retro-orbital/nasal pressure. Like Qnasl better- no drainage. Says sleep study was positive for sleep apnea by [facility]. Neurology is managing.  01/25/15- 64 yoF never smoker followed for allergic rhinitis, complicated by OSA(Neurology manages) FOLLOWS FOR: per Dr Moshe Cipro; allergies getting worse-walking outdoors and has noticed migraines coming on as well as not being able to breathe. Pt has noticed she is not able to bend as well due to facial/head pain. Allergy profile 08/04/13- Total IgE 26.7, Negative Frontal sinus pain worse bending over. Eyes itch and run now in spring pollen season. Shifting nasal congestion. Benadryl at bedtime and azelastine nasal spray.  03/29/15- 46 yoF never smoker followed for allergic rhinitis, complicated by OSA Self referral-pt had sleep study about 3 yrs at [facility] and was given CPAP-not using due to mask issues. DME is [facility] she is asking to have [place] take over management of her obstructive sleep apnea as a new problem for [place]. NPSG 03/16/13 AHI 14.4/ hr, weight 242 lbs, CPAP titrated to 8 at [facility]  Original sleep evaluation was for problem of migraine  headache. Nasal pillows mask irritated her nostrils and the airflow was uncomfortable so she stopped wearing it. Bedtime 11 PM, up around 6 AM. Short sleep latency. Waking 2 or 3 times during the night. Weight gain 15-20 pounds since the original sleep study done. 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 Dymista nasal spray helps but she dislikes the taste. Lives alone with nobody to report sleep problems.  07/03/15- 68 yoF never smoker followed for allergic rhinitis,  OSA, complicated by HBP, obesity, migraine, hx pituitary adenoma Follows For: pt states her sinus has been bothering her for 1 week. pt has a lot of pain in the nose, burning and pressure and tender to the touch. pt states she was fitted for a new oral appliance but has not received it yet and has not been using the CPAP. pt states she has been sleeping ok without CPAP. DME: [facility]. She is waiting to hear if insurance will cover oral appliance from Dr. Ron Parker. Plans flu vaccine at work Bridge of nose is tender, not stuffy and no drainage now. She dislikes the sense of drainage into her throat from azelastine and Flonase nasal sprays and preferred dry nasal spray(Qnasl).  11/06/2015-35 year old female never smoker followed for allergic rhinitis, OSA, complicated by HBP, obesity, migraine, history of pituitary adenoma FOLLOWS FOR:Pt states she is not wearing her CPAP at all. DME is [facility] and no DL's due to this.  Blames dry indoor heat for stuffy nose. Her main complaint now is nasal stuffiness and postnasal drip Has not used CPAP in 2 years. Did see Dr. Ron Parker but somehow follow-up never made and status unknown. I asked her to call him.  ROS-see  HPI Constitutional:   No-   weight loss, night sweats, fevers, chills, +fatigue, lassitude. HEENT:   + headaches, No-difficulty swallowing, tooth/dental problems, sore throat,       +sneezing, +itching, ear ache, +nasal congestion, +post nasal  drip,  CV:  No-   chest pain, orthopnea, PND, swelling in lower extremities, anasarca, dizziness, palpitations Resp: No-   shortness of breath with exertion or at rest.              No-   productive cough,  No non-productive cough,  No- coughing up of blood.              No-   change in color of mucus.  No- wheezing.   Skin: No-   rash or lesions. GI:  No-   heartburn, indigestion, abdominal pain, nausea, vomiting,  GU:  MS:  No-   joint pain or swelling.   Neuro-     nothing unusual Psych:  No- change in mood or affect. No depression or anxiety.  No memory loss.  OBJ- Physical Exam General- Alert, Oriented, Affect-appropriate, Distress- none acute.  + obese Skin- rash-none, lesions- none, excoriation- none.  Lymphadenopathy- none Head- atraumatic            Eyes- Gross vision intact, PERRLA, conjunctivae and secretions clear            Ears- Hearing, canals-normal            Nose- + turbinate edema, , no-Septal dev, mucus, polyps, erosion, perforation             Throat- Mallampati III-IV , mucosa clear , drainage- none, tonsils- atrophic Neck- flexible , trachea midline, no stridor , thyroid nl, carotid no bruit Chest - symmetrical excursion , unlabored           Heart/CV- RRR , no murmur , no gallop  , no rub, nl s1 s2                           - JVD- none , edema- none, stasis changes- none, varices- none           Lung- clear to P&A, wheeze- none, cough- none , dullness-none, rub- none           Chest wall-  Abd-  Br/ Gen/ Rectal- Not done, not indicated Extrem- cyanosis- none, clubbing, none, atrophy- none, strength- nl Neuro- grossly intact to observation

## 2015-11-06 NOTE — Assessment & Plan Note (Signed)
Never compliant with CPAP. Educational discussion done. Alternatives discussed. I encouraged her to follow-up with Dr. Ahmed Prima about trial of an oral advancement device

## 2015-11-06 NOTE — Patient Instructions (Signed)
Sample Dymista nasal spray    Try 1-2 puffs each nostril once daily at bedtime. See if it helps the feeling of head congestion and post-nasal drip   Consider contacting Dr Ron Parker' orthodontic office to see if he meant you to follow up about a mouth piece for sleep apnea   Consider seeing an ENT doctor again if you keep the pressure sensation in your sinuses

## 2015-11-06 NOTE — Assessment & Plan Note (Signed)
Weight loss encouraged 

## 2015-11-12 ENCOUNTER — Encounter: Payer: 59 | Attending: General Surgery | Admitting: Dietician

## 2015-11-12 DIAGNOSIS — Z6841 Body Mass Index (BMI) 40.0 and over, adult: Secondary | ICD-10-CM | POA: Insufficient documentation

## 2015-11-12 DIAGNOSIS — Z713 Dietary counseling and surveillance: Secondary | ICD-10-CM | POA: Insufficient documentation

## 2015-11-12 DIAGNOSIS — E669 Obesity, unspecified: Secondary | ICD-10-CM | POA: Diagnosis not present

## 2015-11-13 ENCOUNTER — Encounter: Payer: Self-pay | Admitting: Dietician

## 2015-11-13 ENCOUNTER — Other Ambulatory Visit: Payer: Self-pay | Admitting: Neurology

## 2015-11-13 NOTE — Progress Notes (Signed)
Supervised Weight Loss Class:  Appt start time: 1600   End time:  1630.  Patient was seen on 11/12/2015 for the "Protein Shakes" Supervised Weight Loss Class at the [facility].   Surgery type: sleeve gastrectomy Start weight at [facility]: 244 lbs on 06/14/15 Weight today: 245.7 lbs Weight change: 1 lbs gain  The following learning objectives were met by the patient during this class:  Objectives: -Review when protein shakes will be recommended in the surgery process -Identify criteria for acceptable protein shakes -Provide examples of acceptable and unacceptable protein shakes -Remind patients that taste preferences may change after surgery  Goals: -Find 1 or more protein shakes that meet the criteria  -Look for a shake that is acceptable for taste and budget  Handouts given: "Protein shakes" pamphlet

## 2015-11-16 ENCOUNTER — Emergency Department (HOSPITAL_COMMUNITY)
Admission: EM | Admit: 2015-11-16 | Discharge: 2015-11-16 | Disposition: A | Discharge status: Home / Self Care | Payer: 59 | Attending: Emergency Medicine | Admitting: Emergency Medicine

## 2015-11-16 ENCOUNTER — Encounter (HOSPITAL_COMMUNITY): Payer: Self-pay

## 2015-11-16 DIAGNOSIS — Z85841 Personal history of malignant neoplasm of brain: Secondary | ICD-10-CM | POA: Insufficient documentation

## 2015-11-16 DIAGNOSIS — R51 Headache: Secondary | ICD-10-CM | POA: Diagnosis present

## 2015-11-16 DIAGNOSIS — Z79899 Other long term (current) drug therapy: Secondary | ICD-10-CM | POA: Insufficient documentation

## 2015-11-16 DIAGNOSIS — Z8709 Personal history of other diseases of the respiratory system: Secondary | ICD-10-CM | POA: Insufficient documentation

## 2015-11-16 DIAGNOSIS — Z792 Long term (current) use of antibiotics: Secondary | ICD-10-CM | POA: Diagnosis not present

## 2015-11-16 DIAGNOSIS — Z8742 Personal history of other diseases of the female genital tract: Secondary | ICD-10-CM | POA: Insufficient documentation

## 2015-11-16 DIAGNOSIS — E669 Obesity, unspecified: Secondary | ICD-10-CM | POA: Insufficient documentation

## 2015-11-16 DIAGNOSIS — I10 Essential (primary) hypertension: Secondary | ICD-10-CM | POA: Insufficient documentation

## 2015-11-16 DIAGNOSIS — G43909 Migraine, unspecified, not intractable, without status migrainosus: Secondary | ICD-10-CM | POA: Diagnosis not present

## 2015-11-16 DIAGNOSIS — G43009 Migraine without aura, not intractable, without status migrainosus: Secondary | ICD-10-CM

## 2015-11-16 MED ORDER — KETOROLAC TROMETHAMINE 30 MG/ML IJ SOLN
30.0000 mg | Freq: Once | INTRAMUSCULAR | Status: AC
  Administered 2015-11-16: 30 mg via INTRAVENOUS
  Filled 2015-11-16: qty 1

## 2015-11-16 MED ORDER — DIPHENHYDRAMINE HCL 50 MG/ML IJ SOLN
50.0000 mg | Freq: Once | INTRAMUSCULAR | Status: AC
  Administered 2015-11-16: 50 mg via INTRAVENOUS
  Filled 2015-11-16: qty 1

## 2015-11-16 MED ORDER — METOCLOPRAMIDE HCL 5 MG/ML IJ SOLN
10.0000 mg | Freq: Once | INTRAMUSCULAR | Status: AC
  Administered 2015-11-16: 10 mg via INTRAVENOUS
  Filled 2015-11-16: qty 2

## 2015-11-16 MED ORDER — DEXAMETHASONE SODIUM PHOSPHATE 10 MG/ML IJ SOLN
10.0000 mg | Freq: Once | INTRAMUSCULAR | Status: AC
  Administered 2015-11-16: 10 mg via INTRAVENOUS
  Filled 2015-11-16: qty 1

## 2015-11-16 MED ORDER — SODIUM CHLORIDE 0.9 % IV BOLUS (SEPSIS)
1000.0000 mL | Freq: Once | INTRAVENOUS | Status: AC
  Administered 2015-11-16: 1000 mL via INTRAVENOUS

## 2015-11-16 NOTE — ED Notes (Signed)
Headache started last night, states shooting pain at times, pain in back of head and into neck

## 2015-11-16 NOTE — ED Notes (Signed)
Pt c/o shooting pain from back of head to neck that started last night.

## 2015-11-16 NOTE — Discharge Instructions (Signed)

## 2015-11-16 NOTE — ED Provider Notes (Signed)
TIME SEEN: 3:35 AM  CHIEF COMPLAINT: Headache  HPI: Pt is a 35 y.o. female with history of obesity, migraine headaches who presents emergency department with diffuse, sharp and throbbing headache worse posteriorly. Described as similar to her prior migraines. Try Tylenol Extra Strength at home without relief. No photophobia or phonophobia. No nausea. No head injury recently. No anticoagulation. No numbness, tingling or focal weakness. No fever. Feels similar to headache she has had in the past.  ROS: See HPI Constitutional: no fever  Eyes: no drainage  ENT: no runny nose   Cardiovascular:  no chest pain  Resp: no SOB  GI: no vomiting GU: no dysuria Integumentary: no rash  Allergy: no hives  Musculoskeletal: no leg swelling  Neurological: no slurred speech ROS otherwise negative  PAST MEDICAL HISTORY/PAST SURGICAL HISTORY:  Past Medical History  Diagnosis Date  . Pituitary adenoma ([place])   . Gynecomastia   . ALLERGIC RHINITIS   . Sinusitis   . Obesity   . Headache(784.0)   . Hypertension   . Sleep apnea   . Common migraine with intractable migraine 02/23/2015    MEDICATIONS:  Prior to Admission medications   Medication Sig Start Date End Date Taking? Authorizing Provider  diphenhydrAMINE (BENADRYL) 25 mg capsule Take 25 mg by mouth at bedtime.    Yes Historical Provider, MD  metoprolol succinate (TOPROL-XL) 25 MG 24 hr tablet TAKE 1 TABLET BY MOUTH DAILY. 11/13/15  Yes Kathrynn Ducking, MD  mupirocin ointment (BACTROBAN) 2 % Apply 1 application topically 2 (two) times daily. 09/27/15  Yes [name], MD  triamterene-hydrochlorothiazide (MAXZIDE-25) 37.5-25 MG tablet Take 1 tablet by mouth daily. 09/20/15  Yes [name], MD  busPIRone (BUSPAR) 5 MG tablet Take 1 tablet (5 mg total) by mouth 3 (three) times daily. 09/27/15   [name], MD    ALLERGIES:  Allergies  Allergen Reactions  . Bromocriptine Mesylate     REACTION: nausea  . Depo-Medrol  [Methylprednisolone Acetate]     Reports itching and redness at the injection site  . Imitrex [Sumatriptan] Other (See Comments)    Severe upset stomach, nausea feelings    SOCIAL HISTORY:  Social History  Substance Use Topics  . Smoking status: Never Smoker   . Smokeless tobacco: Never Used  . Alcohol Use: 0.0 oz/week    0 Standard drinks or equivalent per week     Comment: occasionally    FAMILY HISTORY: Family History  Problem Relation Age of Onset  . Diabetes Maternal Grandfather   . Heart disease Father     stent  . Migraines Father   . Migraines Son     EXAM: BP 122/76 mmHg  Pulse 101  Temp(Src) 98.7 F (37.1 C) (Oral)  Resp 22  Ht 5\' 4"  (1.626 m)  Wt 245 lb (111.131 kg)  BMI 42.03 kg/m2  SpO2 100% CONSTITUTIONAL: Alert and oriented and responds appropriately to questions. Well-appearing; well-nourished HEAD: Normocephalic; patient does have some tenderness at the base of her scalp without swelling, erythema or warmth EYES: Conjunctivae clear, PERRL ENT: normal nose; no rhinorrhea; moist mucous membranes; pharynx without lesions noted NECK: Supple, no meningismus, no LAD; no midline spinal tenderness or step-off or deformity CARD: RRR; S1 and S2 appreciated; no murmurs, no clicks, no rubs, no gallops RESP: Normal chest excursion without splinting or tachypnea; breath sounds clear and equal bilaterally; no wheezes, no rhonchi, no rales, no hypoxia or respiratory distress, speaking full sentences ABD/GI: Normal bowel sounds; non-distended;  soft, non-tender, no rebound, no guarding, no peritoneal signs BACK:  The back appears normal and is non-tender to palpation, there is no CVA tenderness EXT: Normal ROM in all joints; non-tender to palpation; no edema; normal capillary refill; no cyanosis, no calf tenderness or swelling; normal gait   SKIN: Normal color for age and race; warm NEURO: Moves all extremities equally, sensation to light touch intact diffusely, cranial  nerves II through XII intact PSYCH: The patient's mood and manner are appropriate. Grooming and personal hygiene are appropriate.  MEDICAL DECISION MAKING: Patient here with typical chronic headache. Neurologically intact. Afebrile without nuchal rigidity. Doubt infectious etiology, intracranial hemorrhage. Do not feel she needs emergent head imaging. We'll give Toradol, Benadryl, Reglan, fluids and Decadron and reassess.  ED PROGRESS: Patient reports her headache is gone after above medications. She reports feeling better. I feel she is safe to be discharged home with outpatient follow-up. Discussed return precautions. She verbalized understanding and is comfortable with this plan.     [place], DO 11/16/15 [phone]

## 2015-11-19 ENCOUNTER — Ambulatory Visit (INDEPENDENT_AMBULATORY_CARE_PROVIDER_SITE_OTHER): Payer: 59 | Admitting: Family Medicine

## 2015-11-19 ENCOUNTER — Telehealth: Payer: Self-pay

## 2015-11-19 ENCOUNTER — Encounter: Payer: Self-pay | Admitting: Family Medicine

## 2015-11-19 VITALS — BP 130/82 | HR 100 | Resp 18 | Ht 64.0 in | Wt 248.0 lb

## 2015-11-19 DIAGNOSIS — R7303 Prediabetes: Secondary | ICD-10-CM | POA: Diagnosis not present

## 2015-11-19 DIAGNOSIS — I10 Essential (primary) hypertension: Secondary | ICD-10-CM | POA: Diagnosis not present

## 2015-11-19 DIAGNOSIS — G43019 Migraine without aura, intractable, without status migrainosus: Secondary | ICD-10-CM | POA: Diagnosis not present

## 2015-11-19 MED ORDER — DIPHENHYDRAMINE HCL 50 MG/ML IJ SOLN
50.0000 mg | Freq: Once | INTRAMUSCULAR | Status: AC
  Administered 2015-11-19: 50 mg via INTRAMUSCULAR

## 2015-11-19 MED ORDER — PREDNISONE 5 MG PO TABS
5.0000 mg | ORAL_TABLET | Freq: Two times a day (BID) | ORAL | Status: AC
Start: 2015-11-19 — End: 2015-11-23

## 2015-11-19 MED ORDER — KETOROLAC TROMETHAMINE 60 MG/2ML IM SOLN
60.0000 mg | Freq: Once | INTRAMUSCULAR | Status: AC
  Administered 2015-11-19: 60 mg via INTRAMUSCULAR

## 2015-11-19 MED ORDER — BUTALBITAL-APAP-CAFFEINE 50-325-40 MG PO TABS: ORAL_TABLET | ORAL | Status: DC

## 2015-11-19 NOTE — Progress Notes (Signed)
   Subjective:    Patient ID: Kaylee Montgomery, female    DOB: 1981/07/10, 35 y.o.   MRN: [id number]  HPI 4 day h/o disabling lancinating headache from posterior neck to right side of head a up to crown. At a 10 she is incapable of functioning. Seen in ED on 1/27, as she "fwelt she would die" due  To hedache, meds administered provided relief, but on her attempt too return to work today, she had to leave and is being seen due to ongoing sharp disabling shooting pains. Tearful during the visit No weakness or numbness of any extremity, no change in vision.    Review of Systems See HPI Denies recent fever or chills. Denies sinus pressure, nasal congestion, ear pain or sore throat. Denies chest congestion, productive cough or wheezing. Denies chest pains, palpitations and leg swelling Denies abdominal pain, nausea, vomiting,diarrhea or constipation.   Denies dysuria, frequency, hesitancy or incontinence. Denies joint pain, swelling and limitation in mobility. Denies depression, anxiety or insomnia. Denies skin break down or rash.        Objective:   Physical Exam BP 130/82 mmHg  Pulse 100  Resp 18  Ht 5\' 4"  (1.626 m)  Wt 248 lb (112.492 kg)  BMI 42.55 kg/m2  SpO2 98% Patient alert and oriented and in no cardiopulmonary distress.Tearful, anxious and in pain  HEENT: No facial asymmetry, EOMI,   oropharynx pink and moist.  Neck supple no JVD, no mass.  Chest: Clear to auscultation bilaterally.  CVS: S1, S2 no murmurs, no S3.Regular rate.  ABD: Soft non tender.   Ext: No edema  MS: Adequate ROM spine, shoulders, hips and knees.  Skin: Intact, no ulcerations or rash noted.  Psych: Good eye contact, . Memory intact   CNS: CN 2-12 intact, power,  normal throughout.no focal deficits noted.        Assessment & Plan:  Common migraine with intractable migraine Uncontrolled, needs to be out of work and sooner appt with neurology, toradol and benaryl im in office followed  by short course of oral prednisone and fioricet also prescribed. Non focal neuro exam at visit  Essential hypertension Controlled, no change in medication   Morbid obesity ([place]) Unchanged  In the process of getting bariatric surgery, which she needs Weight /BMI 11/19/2015 11/16/2015 11/12/2015  WEIGHT 248 lb 245 lb 245 lb 11.2 oz  HEIGHT 5\' 4"  5\' 4"  5\' 4"   BMI 42.55 kg/m2 42.03 kg/m2 42.15 kg/m2      Prediabetes Updated lab needed at/ before next visit.

## 2015-11-19 NOTE — Patient Instructions (Signed)
F/u in March as before, call if you need me sooner  Toradol and benadryl in the office today for headache  Prednisone sent in and also fioricet to local pharmacy  Work note from today return this Thursday  yoou are referred urgently to Dr Jannifer Franklin since this headache is persisting and preventing you from being able to function  I Ammon SOON>

## 2015-11-19 NOTE — Telephone Encounter (Signed)
Seen in office today  

## 2015-11-19 NOTE — Assessment & Plan Note (Addendum)
Uncontrolled, needs to be out of work and sooner appt with neurology, toradol and benaryl im in office followed by short course of oral prednisone and fioricet also prescribed. Non focal neuro exam at visit

## 2015-11-20 NOTE — Assessment & Plan Note (Signed)
Updated lab needed at/ before next visit.   

## 2015-11-20 NOTE — Assessment & Plan Note (Signed)
Controlled, no change in medication  

## 2015-11-20 NOTE — Assessment & Plan Note (Signed)
Unchanged  In the process of getting bariatric surgery, which she needs Weight /BMI 11/19/2015 11/16/2015 11/12/2015  WEIGHT 248 lb 245 lb 245 lb 11.2 oz  HEIGHT 5\' 4"  5\' 4"  5\' 4"   BMI 42.55 kg/m2 42.03 kg/m2 42.15 kg/m2

## 2015-11-23 ENCOUNTER — Ambulatory Visit (INDEPENDENT_AMBULATORY_CARE_PROVIDER_SITE_OTHER): Payer: 59 | Admitting: Neurology

## 2015-11-23 ENCOUNTER — Encounter: Payer: Self-pay | Admitting: Neurology

## 2015-11-23 VITALS — BP 114/73 | HR 84 | Ht 65.0 in | Wt 244.5 lb

## 2015-11-23 DIAGNOSIS — G43019 Migraine without aura, intractable, without status migrainosus: Secondary | ICD-10-CM | POA: Diagnosis not present

## 2015-11-23 DIAGNOSIS — M5481 Occipital neuralgia: Secondary | ICD-10-CM

## 2015-11-23 MED ORDER — GABAPENTIN 300 MG PO CAPS: 300.0000 mg | ORAL_CAPSULE | Freq: Two times a day (BID) | ORAL | Status: DC

## 2015-11-23 MED ORDER — BETAMETHASONE SOD PHOS & ACET 6 (3-3) MG/ML IJ SUSP
18.0000 mg | Freq: Once | INTRAMUSCULAR | Status: AC
Start: 2015-11-23 — End: 2015-11-23
  Administered 2015-11-23: 18 mg via INTRAMUSCULAR

## 2015-11-23 NOTE — Procedures (Signed)
   History: Kaylee Montgomery is a 35 year old patient with severe worsening of her right occipital headaches with discomfort into the right shoulder. She has required recent emergency room visitation for control of pain. The pain has been significant over the last week prior to this evaluation. She is coming in for trigger point injections and occipital nerve block for treatment of the headache.    Bupivicaine and betamethasone injection protocol for headache  Bupivacaine 0.5% mixed with betamethasone (30 mg/ 5 cc) mixed one-to-one was injected on the scalp and shoulder on the right at several locations:  -On the occipital area of the head, 3 injections the right side, 1 cc per injection at the midpoint between the mastoid process and the occipital protuberance. 2 other injections were done one finger breadth from the initial injection, one at a 10 o'clock position and the other at a 2 o'clock position.  -3 trigger point injections were done along the upper trapezius muscle on the right shoulder to include the mid cervical level. 1 mL was injected at each site.  The patient tolerated the injections well, no complications of the procedure were noted. Injections were made with a 27-gauge needle. A total of 6 mL of solution were injected.  Bupivacaine 0.5%:  NDC number is [phone].  Lot number is [id number]. Expriation date is 05/20/2017.  Betamethasone 30mg [name]:  [facility] # [id number]  Lot number: [id number]  Expiration date: April 2018

## 2015-11-23 NOTE — Progress Notes (Signed)
Reason for visit: Headache  Referring physician: Dr. Daryll Drown is a 35 y.o. female  History of present illness:  Ms. Ruston is a 35 year old right-handed black female with a history of headache. The patient has had a worsening of her headache over the last 1 week. Her headaches are described as a sharp jabbing pain that comes up from her right shoulder into the right neck and into the back of the head on the right side. The episodes of pain may be very intense, but only last for a few minutes, then dissipate. The patient denies any severe neck stiffness, but she does have some soreness in the right shoulder area and trigger points in this region. She reports no discomfort down the right arm. The patient denies any tingling sensations in the head. She has no photophobia or phonophobia with the headache, she denies any nausea or vomiting. There are no visual disturbances. The patient has had multiple episodes of pain daily, she has gone to the emergency room on the 27th of January 2017, she got a headache cocktail at that time. She has been placed on a prednisone Dosepak after seeing Dr. Moshe Cipro, this has been beneficial for several days, but the headache has come back on within the last 24 hours. The patient comes to this office for an evaluation. She is on Toprol 25 mg tablets, this has been somewhat beneficial up until just recently.  Past Medical History  Diagnosis Date  . 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 Gynecomastia   . ALLERGIC RHINITIS   . Sinusitis   . Obesity   . Headache(784.0)   . Hypertension   . Sleep apnea   . Common migraine with intractable migraine 02/23/2015    Past Surgical History  Procedure Laterality Date  . Caesarean    . Tonsillectomy      Family History  Problem Relation Age of Onset  . Diabetes Maternal Grandfather   . Heart disease Father     stent  . Migraines Father   . Migraines Son     Social history:  reports that she has never  smoked. She has never used smokeless tobacco. She reports that she drinks alcohol. She reports that she does not use illicit drugs.  Medications:  Prior to Admission medications   Medication Sig Start Date End Date Taking? Authorizing Provider  busPIRone (BUSPAR) 5 MG tablet Take 1 tablet (5 mg total) by mouth 3 (three) times daily. 09/27/15  Yes [name], MD  butalbital-acetaminophen-caffeine [facility]) [phone] MG tablet One to two  tablet every 8 hours as needed, for uncontrolled headache 11/19/15  Yes [name], MD  diphenhydrAMINE (BENADRYL) 25 mg capsule Take 25 mg by mouth at bedtime.    Yes Historical Provider, MD  metoprolol succinate (TOPROL-XL) 25 MG 24 hr tablet TAKE 1 TABLET BY MOUTH DAILY. 11/13/15  Yes Kathrynn Ducking, MD  predniSONE (DELTASONE) 5 MG tablet Take 1 tablet (5 mg total) by mouth 2 (two) times daily. 11/19/15 11/23/15 Yes [name], MD  triamterene-hydrochlorothiazide (MAXZIDE-25) 37.5-25 MG tablet Take 1 tablet by mouth daily. 09/20/15  Yes [name], MD      Allergies  Allergen Reactions  . Bromocriptine Mesylate     REACTION: nausea  . Depo-Medrol [Methylprednisolone Acetate]     Reports itching and redness at the injection site  . Imitrex [Sumatriptan] Other (See Comments)    Severe upset stomach, nausea feelings    ROS:  Out of a complete 14 system review of symptoms, the patient complains only of the following symptoms, and all other reviewed systems are negative.  Back pain, achy muscles, neck pain, neck stiffness Anxiety Headache  Blood pressure 114/73, pulse 84, height 5\' 5"  (1.651 m), weight 244 lb 8 oz (110.904 kg).  Physical Exam  General: The patient is alert and cooperative at the time of the examination. The patient is moderately obese.  Eyes: Pupils are equal, round, and reactive to light. Discs are flat bilaterally.  Neck: The neck is supple, no carotid bruits are noted.  Respiratory: The  respiratory examination is clear.  Cardiovascular: The cardiovascular examination reveals a regular rate and rhythm, no obvious murmurs or rubs are noted.  Neuromuscular: Range of movement of the cervical spine is full.  Skin: Extremities are without significant edema.  Neurologic Exam  Mental status: The patient is alert and oriented x 3 at the time of the examination. The patient has apparent normal recent and remote memory, with an apparently normal attention span and concentration ability.  Cranial nerves: Facial symmetry is present. There is good sensation of the face to pinprick and soft touch bilaterally. The strength of the facial muscles and the muscles to head turning and shoulder shrug are normal bilaterally. Speech is well enunciated, no aphasia or dysarthria is noted. Extraocular movements are full. Visual fields are full. The tongue is midline, and the patient has symmetric elevation of the soft palate. No obvious hearing deficits are noted.  Motor: The motor testing reveals 5 over 5 strength of all 4 extremities. Good symmetric motor tone is noted throughout.  Sensory: Sensory testing is intact to pinprick, soft touch, vibration sensation, and position sense on all 4 extremities. No evidence of extinction is noted.  Coordination: Cerebellar testing reveals good finger-nose-finger and heel-to-shin bilaterally.  Gait and station: Gait is normal. Tandem gait is normal. Romberg is negative. No drift is seen.  Reflexes: Deep tendon reflexes are symmetric and normal bilaterally. Toes are downgoing bilaterally.   Assessment/Plan:  1. Right occipital headache  The patient is having a significant worsening of her headache recently. The headache appears to have some features of occipital neuralgia, but the pain begins in the right shoulder area with trigger points in this region. The patient will be placed on gabapentin taking 300 mg twice daily. I will do trigger point injections  on the right shoulder and a right occipital nerve injection today. The patient will follow-up for her routine revisit in March 2017. The patient will contact me if she has ongoing issues.  Jill Alexanders MD 11/23/2015 12:45 PM  [facility] [address] [place], [place] [address]  Phone [phone] Fax [phone]

## 2015-12-10 ENCOUNTER — Encounter: Payer: 59 | Attending: General Surgery | Admitting: Dietician

## 2015-12-10 ENCOUNTER — Encounter: Payer: Self-pay | Admitting: Dietician

## 2015-12-10 DIAGNOSIS — E669 Obesity, unspecified: Secondary | ICD-10-CM | POA: Diagnosis not present

## 2015-12-10 DIAGNOSIS — Z6841 Body Mass Index (BMI) 40.0 and over, adult: Secondary | ICD-10-CM | POA: Diagnosis not present

## 2015-12-10 DIAGNOSIS — Z713 Dietary counseling and surveillance: Secondary | ICD-10-CM | POA: Insufficient documentation

## 2015-12-10 NOTE — Progress Notes (Signed)
Supervised Weight Loss Class:  Appt start time: 1600   End time:  1630.  Patient was seen on 12/10/2015 for the "Fluid" Supervised Weight Loss Class at [facility].   Surgery type: sleeve gastrectomy Start weight at [facility]: 244 lbs on 06/14/15 Weight today: 242.8 lbs Weight change: 3 lbs loss  Objectives: -Discuss importance of fluid and hydration status after surgery -Identify appropriate fluids after surgery -Discuss importance of limiting caffeine in the perioperative period -Review importance of avoiding drinking while eating  Goals: -Begin to wean off of caffeine -Practice avoiding fluids 15 minutes before, during, and 30 minutes after meals -Begin to work your way up to 64 ounces of sugar free, caffeine free, non-carbonated fluids  Handouts given: "Fluid" handout

## 2015-12-24 ENCOUNTER — Other Ambulatory Visit: Payer: Self-pay

## 2015-12-24 ENCOUNTER — Other Ambulatory Visit: Payer: Self-pay | Admitting: Family Medicine

## 2015-12-24 MED ORDER — TRIAMTERENE-HCTZ 37.5-25 MG PO TABS: 1.0000 | ORAL_TABLET | Freq: Every day | ORAL | Status: DC

## 2015-12-27 ENCOUNTER — Encounter: Payer: Self-pay | Admitting: Family Medicine

## 2015-12-27 ENCOUNTER — Ambulatory Visit (INDEPENDENT_AMBULATORY_CARE_PROVIDER_SITE_OTHER): Payer: 59 | Admitting: Family Medicine

## 2015-12-27 VITALS — BP 110/80 | HR 98 | Resp 18 | Ht 64.0 in | Wt 248.0 lb

## 2015-12-27 DIAGNOSIS — I10 Essential (primary) hypertension: Secondary | ICD-10-CM

## 2015-12-27 DIAGNOSIS — J302 Other seasonal allergic rhinitis: Secondary | ICD-10-CM

## 2015-12-27 DIAGNOSIS — G4733 Obstructive sleep apnea (adult) (pediatric): Secondary | ICD-10-CM

## 2015-12-27 DIAGNOSIS — J309 Allergic rhinitis, unspecified: Secondary | ICD-10-CM

## 2015-12-27 DIAGNOSIS — J3089 Other allergic rhinitis: Secondary | ICD-10-CM

## 2015-12-27 DIAGNOSIS — M542 Cervicalgia: Secondary | ICD-10-CM | POA: Diagnosis not present

## 2015-12-27 MED ORDER — RANITIDINE HCL 300 MG PO TABS: 300.0000 mg | ORAL_TABLET | Freq: Every day | ORAL | Status: DC

## 2015-12-27 MED ORDER — CYCLOBENZAPRINE HCL 10 MG PO TABS: ORAL_TABLET | ORAL | Status: DC

## 2015-12-27 MED ORDER — PREDNISONE 5 MG PO TABS: 5.0000 mg | ORAL_TABLET | Freq: Two times a day (BID) | ORAL | Status: AC

## 2015-12-27 MED ORDER — KETOROLAC TROMETHAMINE 60 MG/2ML IM SOLN
60.0000 mg | Freq: Once | INTRAMUSCULAR | Status: AC
  Administered 2015-12-27: 60 mg via INTRAMUSCULAR

## 2015-12-27 MED ORDER — IBUPROFEN 800 MG PO TABS: 800.0000 mg | ORAL_TABLET | Freq: Three times a day (TID) | ORAL | Status: DC

## 2015-12-27 NOTE — Progress Notes (Signed)
   Subjective:    Patient ID: Kaylee Montgomery, female    DOB: Nov 16, 1980, 35 y.o.   MRN: [id number]  HPI  4 day h/o neck and upper back pain, worsening wants help Has upcoming bariatric surgery , which she is looking forward to having, though still "has fear " about the procedure   Review of Systems See HPI Denies recent fever or chills. Denies sinus pressure, nasal congestion, ear pain or sore throat. Denies chest congestion, productive cough or wheezing. Denies chest pains, palpitations and leg swelling Denies abdominal pain, nausea, vomiting,diarrhea or constipation.   Denies dysuria, frequency, hesitancy or incontinence. Denies headaches, seizures, numbness, or tingling. Denies depression, anxiety or insomnia. Denies skin break down or rash.        Objective:   Physical Exam  BP 110/80 mmHg  Pulse 98  Resp 18  Ht 5\' 4"  (1.626 m)  Wt 248 lb (112.492 kg)  BMI 42.55 kg/m2  SpO2 99% Patient alert and oriented and in no cardiopulmonary distress.Crying in pain  HEENT: No facial asymmetry, EOMI,   oropharynx pink and moist.  Neck decreased ROM with bilateral spasm, no JVD, no mass.  Chest: Clear to auscultation bilaterally.  CVS: S1, S2 no murmurs, no S3.Regular rate.  ABD: Soft non tender.   Ext: No edema  MS: Adequate ROM spine, shoulders, hips and knees.  Skin: Intact, no ulcerations or rash noted.  Psych: Good eye contact, normal affect. Memory intact mildly  anxious not  depressed appearing.  CNS: CN 2-12 intact, power,  normal throughout.no focal deficits noted.       Assessment & Plan:  Bilateral neck pain Increased and uncontrolled pain x 4 days, toradol in office followed by ibuprofen  And  flexeril   Morbid obesity ([place]) Deteriorated. Patient re-educated about  the importance of commitment to a  minimum of 150 minutes of exercise per week.  The importance of healthy food choices with portion control discussed. Encouraged to start a food diary,  count calories and to consider  joining a support group. Sample diet sheets offered. Goals set by the patient for the next several months.   Weight /BMI 12/27/2015 12/10/2015 11/23/2015  WEIGHT 248 lb 242 lb 12.8 oz 244 lb 8 oz  HEIGHT 5\' 4"  5\' 4"  5\' 5"   BMI 42.55 kg/m2 41.66 kg/m2 40.69 kg/m2    Current exercise per week 90 minutes.   Essential hypertension Controlled, no change in medication DASH diet and commitment to daily physical activity for a minimum of 30 minutes discussed and encouraged, as a part of hypertension management. The importance of attaining a healthy weight is also discussed.  BP/Weight 12/27/2015 12/10/2015 11/23/2015 11/19/2015 11/16/2015 11/12/2015 [id number]  Systolic BP [id number] - [id number] - [id number]  Diastolic BP 80 - 73 82 76 - 72  Wt. (Lbs) 248 242.8 244.5 248 245 245.7 243.2  BMI 42.55 41.66 40.69 42.55 42.03 42.15 40.47        Obstructive sleep apnea Compliance daily is stressed, still adjusting to appliance  Seasonal and perennial allergic rhinitis No current flare

## 2015-12-27 NOTE — Patient Instructions (Signed)
F/u in 4 month, call if you need me sooner  You have recived torADOL IN OFFICE FOR PAIN, AND IBUPROFEN, PREDNISONE, RANITINE AND FLEXERIL HAVE BEEN SENT TO YOUR PHARMACY  cONTINUIE CURENT MEDICATION  ALL THE BEST WITH UPCOMING SURGERY    bp IS GOOD  Thanks for choosing [facility], we consider it a privelige to serve you.

## 2016-01-01 ENCOUNTER — Ambulatory Visit (HOSPITAL_COMMUNITY): Payer: 59

## 2016-01-01 ENCOUNTER — Telehealth: Payer: Self-pay

## 2016-01-01 ENCOUNTER — Ambulatory Visit: Payer: 59 | Admitting: Adult Health

## 2016-01-01 ENCOUNTER — Other Ambulatory Visit (HOSPITAL_COMMUNITY): Payer: 59

## 2016-01-01 NOTE — Telephone Encounter (Signed)
I called pt's cell phone again to advise her of her appointment needing to be changed. No answer, left another message asking her to call us back. This is the third attempt at rescheduling.

## 2016-01-01 NOTE — Telephone Encounter (Signed)
I also left a message at pt's work number asking her to give me a call back.

## 2016-01-01 NOTE — Telephone Encounter (Signed)
This encounter was created in error - please disregard.

## 2016-01-01 NOTE — Telephone Encounter (Signed)
I called pt to reschedule her appt today with Jinny Blossom, NP because Jinny Blossom is out sick. No answer, left a message asking her to call me back. Pt may be scheduled with Hoyle Sauer, NP today if there are openings.

## 2016-01-02 ENCOUNTER — Ambulatory Visit (HOSPITAL_COMMUNITY): Payer: 59

## 2016-01-03 ENCOUNTER — Ambulatory Visit (HOSPITAL_COMMUNITY)
Admission: RE | Admit: 2016-01-03 | Discharge: 2016-01-03 | Disposition: A | Discharge status: Home / Self Care | Payer: 59 | Source: Ambulatory Visit | Attending: General Surgery | Admitting: General Surgery

## 2016-01-03 DIAGNOSIS — K760 Fatty (change of) liver, not elsewhere classified: Secondary | ICD-10-CM | POA: Diagnosis not present

## 2016-01-03 DIAGNOSIS — I517 Cardiomegaly: Secondary | ICD-10-CM | POA: Diagnosis not present

## 2016-01-03 DIAGNOSIS — R7303 Prediabetes: Secondary | ICD-10-CM

## 2016-01-03 DIAGNOSIS — Z01818 Encounter for other preprocedural examination: Secondary | ICD-10-CM | POA: Insufficient documentation

## 2016-01-13 NOTE — Assessment & Plan Note (Signed)
Compliance daily is stressed, still adjusting to appliance

## 2016-01-13 NOTE — Assessment & Plan Note (Signed)
Increased and uncontrolled pain x 4 days, toradol in office followed by ibuprofen  And  flexeril

## 2016-01-13 NOTE — Assessment & Plan Note (Signed)
No current flare 

## 2016-01-13 NOTE — Assessment & Plan Note (Signed)
Deteriorated. Patient re-educated about  the importance of commitment to a  minimum of 150 minutes of exercise per week.  The importance of healthy food choices with portion control discussed. Encouraged to start a food diary, count calories and to consider  joining a support group. Sample diet sheets offered. Goals set by the patient for the next several months.   Weight /BMI 12/27/2015 12/10/2015 11/23/2015  WEIGHT 248 lb 242 lb 12.8 oz 244 lb 8 oz  HEIGHT 5\' 4"  5\' 4"  5\' 5"   BMI 42.55 kg/m2 41.66 kg/m2 40.69 kg/m2    Current exercise per week 90 minutes.

## 2016-01-13 NOTE — Assessment & Plan Note (Signed)
Controlled, no change in medication DASH diet and commitment to daily physical activity for a minimum of 30 minutes discussed and encouraged, as a part of hypertension management. The importance of attaining a healthy weight is also discussed.  BP/Weight 12/27/2015 12/10/2015 11/23/2015 11/19/2015 11/16/2015 11/12/2015 [id number]  Systolic BP A999333 - [id number] - [id number]  Diastolic BP 80 - 73 82 76 - 72  Wt. (Lbs) 248 242.8 244.5 248 245 245.7 243.2  BMI 42.55 41.66 40.69 42.55 42.03 42.15 40.47

## 2016-01-15 ENCOUNTER — Ambulatory Visit (INDEPENDENT_AMBULATORY_CARE_PROVIDER_SITE_OTHER): Payer: 59 | Admitting: Adult Health

## 2016-01-15 ENCOUNTER — Encounter: Payer: Self-pay | Admitting: Adult Health

## 2016-01-15 VITALS — BP 118/73 | HR 95 | Resp 16 | Ht 65.0 in | Wt 245.0 lb

## 2016-01-15 DIAGNOSIS — G43009 Migraine without aura, not intractable, without status migrainosus: Secondary | ICD-10-CM

## 2016-01-15 DIAGNOSIS — M5481 Occipital neuralgia: Secondary | ICD-10-CM

## 2016-01-15 MED ORDER — GABAPENTIN 300 MG PO CAPS: 300.0000 mg | ORAL_CAPSULE | Freq: Two times a day (BID) | ORAL | Status: DC

## 2016-01-15 MED ORDER — METOPROLOL SUCCINATE ER 25 MG PO TB24: 25.0000 mg | ORAL_TABLET | Freq: Every day | ORAL | Status: DC

## 2016-01-15 NOTE — Progress Notes (Signed)
I have read the note, and I agree with the clinical assessment and plan.  Kaylee Montgomery   

## 2016-01-15 NOTE — Patient Instructions (Signed)
Continue gabapentin and metoprolol. Try taking 1 tablet of the gabapentin in the morning If your symptoms worsen or you develop new symptoms please let us know.

## 2016-01-15 NOTE — Progress Notes (Signed)
PATIENT: Kaylee Montgomery DOB: January 30, 1981  REASON FOR VISIT: follow up- headaches, occipital neuraliga HISTORY FROM: patient  HISTORY OF PRESENT ILLNESS: Ms. Montgomery is a 35 year old female with a history of headaches and occipital neuralgia. She returns today for follow-up. She has been taking gabapentin 300 mg at bedtime. She states that she never took the morning dose. She is also on metoprolol. She states that she has not had any more of the sharp shooting pains in the occipital region however she does have tension that occurs in the back of the head as if she's going to get the sharp shooting pains. She states that this is very sporadic. She states occasionally she can go weeks without having this tension and other times she will have it consecutively. She reported that the trigger point injections were beneficial. She reports that she does have some neck and shoulder discomfort. 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 She does try to stretch throughout the day. She denies any new neurological symptoms. She returns today for follow-up.  HISTORY 11/23/15: Kaylee Montgomery is a 35 year old right-handed black female with a history of headache. The patient has had a worsening of her headache over the last 1 week. Her headaches are described as a sharp jabbing pain that comes up from her right shoulder into the right neck and into the back of the head on the right side. The episodes of pain may be very intense, but only last for a few minutes, then dissipate. The patient denies any severe neck stiffness, but she does have some soreness in the right shoulder area and trigger points in this region. She reports no discomfort down the right arm. The patient denies any tingling sensations in the head. She has no photophobia or phonophobia with the headache, she denies any nausea or vomiting. There are no visual disturbances. The patient has had multiple episodes of pain daily, she has gone to the emergency  room on the 27th of January 2017, she got a headache cocktail at that time. She has been placed on a prednisone Dosepak after seeing Dr. Moshe Cipro, this has been beneficial for several days, but the headache has come back on within the last 24 hours. The patient comes to this office for an evaluation. She is on Toprol 25 mg tablets, this has been somewhat beneficial up until just recently  REVIEW OF SYSTEMS: Out of a complete 14 system review of symptoms, the patient complains only of the following symptoms, and all other reviewed systems are negative.  Neck pain, moles, headache, environmental allergies  ALLERGIES: Allergies  Allergen Reactions  . Bromocriptine Mesylate     REACTION: nausea  . Depo-Medrol [Methylprednisolone Acetate]     Reports itching and redness at the injection site  . Imitrex [Sumatriptan] Other (See Comments)    Severe upset stomach, nausea feelings    HOME MEDICATIONS: Outpatient Prescriptions Prior to Visit  Medication Sig Dispense Refill  . gabapentin (NEURONTIN) 300 MG capsule Take 1 capsule (300 mg total) by mouth 2 (two) times daily. 60 capsule 2  . ibuprofen (ADVIL,MOTRIN) 800 MG tablet Take 1 tablet (800 mg total) by mouth 3 (three) times daily. (Patient taking differently: Take 800 mg by mouth as needed. ) 21 tablet 0  . metoprolol succinate (TOPROL-XL) 25 MG 24 hr tablet TAKE 1 TABLET BY MOUTH DAILY. 30 tablet 1  . triamterene-hydrochlorothiazide (MAXZIDE-25) 37.5-25 MG tablet Take 1 tablet by mouth daily. 90 tablet 1  . cyclobenzaprine (  FLEXERIL) 10 MG tablet One tablet at bedtime, for muscle spasm , for 5 days, then as  needed (Patient not taking: Reported on 01/15/2016) 30 tablet 0  . diphenhydrAMINE (BENADRYL) 25 mg capsule Take 25 mg by mouth at bedtime. Reported on 01/15/2016    . ranitidine (ZANTAC) 300 MG tablet Take 1 tablet (300 mg total) by mouth at bedtime. (Patient not taking: Reported on 01/15/2016) 14 tablet 0   No facility-administered  medications prior to visit.    PAST MEDICAL HISTORY: Past Medical History  Diagnosis Date  . Pituitary adenoma ([place])   . Gynecomastia   . ALLERGIC RHINITIS   . Sinusitis   . Obesity   . Headache(784.0)   . Hypertension   . Sleep apnea   . Common migraine with intractable migraine 02/23/2015    PAST SURGICAL HISTORY: Past Surgical History  Procedure Laterality Date  . Caesarean    . Tonsillectomy      FAMILY HISTORY: Family History  Problem Relation Age of Onset  . Diabetes Maternal Grandfather   . Heart disease Father     stent  . Migraines Father   . Migraines Son     SOCIAL HISTORY: Social History   Social History  . Marital Status: Single    Spouse Name: N/A  . Number of Children: 1  . Years of Education: N/A   Occupational History  . STUDENT    . CASEWORKER    Social History Main Topics  . Smoking status: Never Smoker   . Smokeless tobacco: Never Used  . Alcohol Use: 0.0 oz/week    0 Standard drinks or equivalent per week     Comment: occasionally  . Drug Use: No  . Sexual Activity: Not on file   Other Topics Concern  . Not on file   Social History Narrative   Patient is right handed.   Patient drinks 1-2 cups of caffeine daily.      PHYSICAL EXAM  Filed Vitals:   01/15/16 1431  BP: 118/73  Pulse: 95  Resp: 16  Height: '5\' 5"'$  (1.651 m)  Weight: 245 lb (111.131 kg)   Body mass index is 40.77 kg/(m^2).  Generalized: Well developed, in no acute distress   Neurological examination  Mentation: Alert oriented to time, place, history taking. Follows all commands speech and language fluent Cranial nerve II-XII: Pupils were equal round reactive to light. Extraocular movements were full, visual field were full on confrontational test. Facial sensation and strength were normal. Uvula tongue midline. Head turning and shoulder shrug  were normal and symmetric. Motor: The motor testing reveals 5 over 5 strength of all 4 extremities. Good symmetric  motor tone is noted throughout.  Sensory: Sensory testing is intact to soft touch on all 4 extremities. No evidence of extinction is noted.  Coordination: Cerebellar testing reveals good finger-nose-finger and heel-to-shin bilaterally.  Gait and station: Gait is normal. Tandem gait is normal. Romberg is negative. No drift is seen.  Reflexes: Deep tendon reflexes are symmetric and normal bilaterally.   DIAGNOSTIC DATA (LABS, IMAGING, TESTING) - I reviewed patient records, labs, notes, testing and imaging myself where available.      Component Value Date/Time   NA 139 01/10/2015 1617   K 3.6 01/10/2015 1617   CL 103 01/10/2015 1617   CO2 26 01/10/2015 1617   GLUCOSE 82 01/10/2015 1617   BUN 9 01/10/2015 1617   CREATININE 0.59 01/10/2015 1617   CREATININE 0.64 12/27/2010 1040   CALCIUM 9.7 01/10/2015  1617   PROT 6.9 01/10/2015 1617   ALBUMIN 4.9 01/10/2015 1617   AST 35 01/10/2015 1617   ALT 67* 01/10/2015 1617   ALKPHOS 38* 01/10/2015 1617   BILITOT 0.7 01/10/2015 1617   GFRNONAA >89 01/10/2015 1617   GFRNONAA >60 12/27/2010 1040   GFRAA >89 01/10/2015 1617   GFRAA  12/27/2010 1040    >60        The eGFR has been calculated using the MDRD equation. This calculation has not been validated in all clinical situations. eGFR's persistently <60 mL/min signify possible Chronic Kidney Disease.   Lab Results  Component Value Date   CHOL 174 10/24/2013   HDL 33* 10/24/2013   LDLCALC 117* 10/24/2013   TRIG 118 10/24/2013   CHOLHDL 5.3 10/24/2013   Lab Results  Component Value Date   HGBA1C 6.2* 01/10/2015    Lab Results  Component Value Date   TSH 1.744 01/10/2015      ASSESSMENT AND PLAN 35 y.o. year old female  has a past medical history of Pituitary adenoma ([place]); Gynecomastia; ALLERGIC RHINITIS; Sinusitis; Obesity; Headache(784.0); Hypertension; Sleep apnea; and Common migraine with intractable migraine (02/23/2015). here with:  1. Migraine headaches 2. Occipital  neuralgia  Overall the patient is doing well. She'll continue on gabapentin. Encouraged her to try taking the morning dose of gabapentin. She verbalized understanding. She will continue on metoprolol. The patient does have a lot of neck and shoulder tension. In the future we may have to try a muscle relaxer such as tizanidine. For now she will try increasing her gabapentin to taking it as prescribed twice daily. If this is not beneficial or she is unable to tolerate it she will let us know. She will follow-up in 6 months or sooner if needed.     Ward Givens, MSN, NP-C 01/15/2016, 2:46 PM [facility] [address], [place], [place] [address] [phone]

## 2016-01-28 ENCOUNTER — Encounter: Payer: 59 | Attending: General Surgery

## 2016-01-28 DIAGNOSIS — Z6841 Body Mass Index (BMI) 40.0 and over, adult: Secondary | ICD-10-CM | POA: Diagnosis not present

## 2016-01-28 DIAGNOSIS — E669 Obesity, unspecified: Secondary | ICD-10-CM | POA: Insufficient documentation

## 2016-01-28 DIAGNOSIS — Z713 Dietary counseling and surveillance: Secondary | ICD-10-CM | POA: Diagnosis not present

## 2016-01-28 NOTE — Progress Notes (Signed)
  Pre-Operative Nutrition Class:  Appt start time: 9507   End time:  1830.  Patient was seen on 01/28/2016 for Pre-Operative Bariatric Surgery Education at the [facility].   Surgery date:  Surgery type: sleeve gastrectomy [facility] weight at [facility]: 244 lbs on 06/14/15 Weight today: 250 lbs  TANITA  BODY COMP RESULTS  01/28/16   BMI (kg/m^2) 42.9   Fat Mass (lbs) 127.5   Fat Free Mass (lbs) 122.5   Total Body Water (lbs) 89.5   Samples given per MNT protocol. Patient educated on appropriate usage: Premier protein shake (vanilla - qty 1) Lot #: [id number] Exp: 09/2016  Bariatric Advantage Calcium Citrate chew (tropical orange - qty 1) Lot #: [id number] Exp: 05/2016  Celebrate Vitamins Multivitamin (pineapple strawberry - qty 1) Lot #: [id number] Exp: 09/2016  Renee Pain Protein Powder (unflavored - qty 1) Lot #: [id number] Exp: 04/2017  The following the learning objectives were met by the patient during this course:  Identify Pre-Op Dietary Goals and will begin 2 weeks pre-operatively  Identify appropriate sources of fluids and proteins   State protein recommendations and appropriate sources pre and post-operatively  Identify Post-Operative Dietary Goals and will follow for 2 weeks post-operatively  Identify appropriate multivitamin and calcium sources  Describe the need for physical activity post-operatively and will follow MD recommendations  State when to call healthcare provider regarding medication questions or post-operative complications  Handouts given during class include:  Pre-Op Bariatric Surgery Diet Handout  Protein Shake Handout  Post-Op Bariatric Surgery Nutrition Handout  BELT Program Information Flyer  Support Group Information Flyer  [facility] Bariatric Supplements Price List  Follow-Up Plan: Patient will follow-up at [facility] 2 weeks post operatively for diet advancement per MD.

## 2016-02-04 ENCOUNTER — Ambulatory Visit: Payer: Self-pay | Admitting: General Surgery

## 2016-02-12 ENCOUNTER — Telehealth: Payer: Self-pay | Admitting: Adult Health

## 2016-02-12 NOTE — Telephone Encounter (Signed)
Pt called sts she is having surgery 02/18/16 and she has been advised to stop a certain medication. Pt does not know the name of the medication, said she will look it up on mychart. Operator explained she could send email with questions to provider. Pt was not aware and will send email.

## 2016-02-13 NOTE — Telephone Encounter (Signed)
Noted. Will wait for her to email or call with drug name.

## 2016-02-13 NOTE — Progress Notes (Signed)
CHEST XRAY 01-03-16 EPIC

## 2016-02-13 NOTE — Patient Instructions (Addendum)
Kaylee Montgomery  02/13/2016   Your procedure is scheduled on:   Report to [facility] Main  Entrance take [facility]  elevators to 3rd floor to  [place] at 515 AM.  Call this number if you have problems the morning of surgery [phone]   Remember: ONLY 1 PERSON MAY GO WITH YOU TO SHORT STAY TO GET  READY MORNING OF [place].  Do not eat food or drink liquids :After Midnight.  BRING CPAP MASK AND TUBING   Take these medicines the morning of surgery with A SIP OF WATER: FLONASE NASAL SPRAY IF NEEDED, OCEAN NASAL SPRAY IF NEEDED, GABAPENTIN              You may not have any metal on your body including hair pins and              piercings  Do not wear jewelry, make-up, lotions, powders or perfumes, deodorant             Do not wear nail polish.  Do not shave  48 hours prior to surgery.              Men may shave face and neck.   Do not bring valuables to the hospital. [place].  Contacts, dentures or bridgework may not be worn into surgery.  Leave suitcase in the car. After surgery it may be brought to your room.                  Please read over the following fact sheets you were given: _____________________________________________________________________             [facility] - Preparing for Surgery Before surgery, you can play an important role.  Because skin is not sterile, your skin needs to be as free of germs as possible.  You can reduce the number of germs on your skin by washing with CHG (chlorahexidine gluconate) soap before surgery.  CHG is an antiseptic cleaner which kills germs and bonds with the skin to continue killing germs even after washing. Please DO NOT use if you have an allergy to CHG or antibacterial soaps.  If your skin becomes reddened/irritated stop using the CHG and inform your nurse when you arrive at Short Stay. Do not shave (including legs and underarms) for at least 48  hours prior to the first CHG shower.  You may shave your face/neck. Please follow these instructions carefully:  1.  Shower with CHG Soap the night before surgery and the  morning of Surgery.  2.  If you choose to wash your hair, wash your hair first as usual with your  normal  shampoo.  3.  After you shampoo, rinse your hair and body thoroughly to remove the  shampoo.                           4.  Use CHG as you would any other liquid soap.  You can apply chg directly  to the skin and wash                       Gently with a scrungie or clean washcloth.  5.  Apply the CHG Soap to your body ONLY  FROM THE NECK DOWN.   Do not use on face/ open                           Wound or open sores. Avoid contact with eyes, ears mouth and genitals (private parts).                       Wash face,  Genitals (private parts) with your normal soap.             6.  Wash thoroughly, paying special attention to the area where your surgery  will be performed.  7.  Thoroughly rinse your body with warm water from the neck down.  8.  DO NOT shower/wash with your normal soap after using and rinsing off  the CHG Soap.                9.  Pat yourself dry with a clean towel.            10.  Wear clean pajamas.            11.  Place clean sheets on your bed the night of your first shower and do not  sleep with pets. Day of Surgery : Do not apply any lotions/deodorants the morning of surgery.  Please wear clean clothes to the hospital/surgery center.  FAILURE TO FOLLOW THESE INSTRUCTIONS MAY RESULT IN THE CANCELLATION OF YOUR SURGERY PATIENT SIGNATURE_________________________________  NURSE SIGNATURE__________________________________  ________________________________________________________________________

## 2016-02-14 ENCOUNTER — Encounter (HOSPITAL_COMMUNITY): Payer: Self-pay

## 2016-02-14 ENCOUNTER — Encounter (HOSPITAL_COMMUNITY)
Admission: RE | Admit: 2016-02-14 | Discharge: 2016-02-14 | Disposition: A | Discharge status: Home / Self Care | Payer: 59 | Source: Ambulatory Visit | Attending: General Surgery | Admitting: General Surgery

## 2016-02-14 DIAGNOSIS — Z01818 Encounter for other preprocedural examination: Secondary | ICD-10-CM | POA: Diagnosis present

## 2016-02-14 HISTORY — DX: Other complications of anesthesia, initial encounter: T88.59XA

## 2016-02-14 LAB — COMPREHENSIVE METABOLIC PANEL
ALT: 61 U/L — ABNORMAL HIGH (ref 14–54)
AST: 34 U/L (ref 15–41)
Albumin: 4.6 g/dL (ref 3.5–5.0)
Alkaline Phosphatase: 38 U/L (ref 38–126)
Anion gap: 11 (ref 5–15)
BUN: 12 mg/dL (ref 6–20)
CO2: 26 mmol/L (ref 22–32)
Calcium: 9.7 mg/dL (ref 8.9–10.3)
Chloride: 102 mmol/L (ref 101–111)
Creatinine, Ser: 0.56 mg/dL (ref 0.44–1.00)
GFR calc Af Amer: >60 mL/min (ref >60)
GFR calc non Af Amer: >60 mL/min (ref >60)
Glucose, Bld: 80 mg/dL (ref 65–99)
Potassium: 3.3 mmol/L — ABNORMAL LOW (ref 3.5–5.1)
Sodium: 139 mmol/L (ref 135–145)
Total Bilirubin: 1 mg/dL (ref 0.3–1.2)
Total Protein: 7.8 g/dL (ref 6.5–8.1)

## 2016-02-14 LAB — CBC WITH DIFFERENTIAL/PLATELET
Basophils Absolute: 0 10*3/uL (ref 0.0–0.1)
Basophils Relative: 1 %
Eosinophils Absolute: 0.1 10*3/uL (ref 0.0–0.7)
Eosinophils Relative: 2 %
HCT: 39.7 % (ref 36.0–46.0)
Hemoglobin: 12.8 g/dL (ref 12.0–15.0)
Lymphocytes Relative: 41 %
Lymphs Abs: 3.2 10*3/uL (ref 0.7–4.0)
MCH: 25.7 pg — ABNORMAL LOW (ref 26.0–34.0)
MCHC: 32.2 g/dL (ref 30.0–36.0)
MCV: 79.6 fL (ref 78.0–100.0)
Monocytes Absolute: 0.8 10*3/uL (ref 0.1–1.0)
Monocytes Relative: 10 %
Neutro Abs: 3.7 10*3/uL (ref 1.7–7.7)
Neutrophils Relative %: 46 %
Platelets: 441 10*3/uL — ABNORMAL HIGH (ref 150–400)
RBC: 4.99 MIL/uL (ref 3.87–5.11)
RDW: 13.1 % (ref 11.5–15.5)
WBC: 7.9 10*3/uL (ref 4.0–10.5)

## 2016-02-18 ENCOUNTER — Encounter (HOSPITAL_COMMUNITY)
Admission: RE | Disposition: A | Discharge status: Home / Self Care | Payer: Self-pay | Source: Ambulatory Visit | Attending: General Surgery

## 2016-02-18 ENCOUNTER — Inpatient Hospital Stay (HOSPITAL_COMMUNITY): Payer: 59 | Admitting: Certified Registered Nurse Anesthetist

## 2016-02-18 ENCOUNTER — Encounter (HOSPITAL_COMMUNITY): Payer: Self-pay | Admitting: *Deleted

## 2016-02-18 ENCOUNTER — Inpatient Hospital Stay (HOSPITAL_COMMUNITY)
Admission: RE | Admit: 2016-02-18 | Discharge: 2016-02-20 | DRG: 621 | Disposition: A | Discharge status: Home / Self Care | Payer: 59 | Source: Ambulatory Visit | Attending: General Surgery | Admitting: General Surgery

## 2016-02-18 DIAGNOSIS — R7303 Prediabetes: Secondary | ICD-10-CM | POA: Diagnosis present

## 2016-02-18 DIAGNOSIS — E669 Obesity, unspecified: Secondary | ICD-10-CM | POA: Diagnosis present

## 2016-02-18 DIAGNOSIS — Z8249 Family history of ischemic heart disease and other diseases of the circulatory system: Secondary | ICD-10-CM | POA: Diagnosis not present

## 2016-02-18 DIAGNOSIS — K760 Fatty (change of) liver, not elsewhere classified: Secondary | ICD-10-CM | POA: Diagnosis present

## 2016-02-18 DIAGNOSIS — M542 Cervicalgia: Secondary | ICD-10-CM

## 2016-02-18 DIAGNOSIS — Z9884 Bariatric surgery status: Secondary | ICD-10-CM

## 2016-02-18 DIAGNOSIS — Z833 Family history of diabetes mellitus: Secondary | ICD-10-CM | POA: Diagnosis not present

## 2016-02-18 DIAGNOSIS — G4733 Obstructive sleep apnea (adult) (pediatric): Secondary | ICD-10-CM | POA: Diagnosis present

## 2016-02-18 DIAGNOSIS — E66811 Obesity, class 1: Secondary | ICD-10-CM | POA: Diagnosis present

## 2016-02-18 DIAGNOSIS — E876 Hypokalemia: Secondary | ICD-10-CM | POA: Diagnosis not present

## 2016-02-18 DIAGNOSIS — Z6841 Body Mass Index (BMI) 40.0 and over, adult: Secondary | ICD-10-CM

## 2016-02-18 DIAGNOSIS — G43019 Migraine without aura, intractable, without status migrainosus: Secondary | ICD-10-CM | POA: Diagnosis present

## 2016-02-18 DIAGNOSIS — I10 Essential (primary) hypertension: Secondary | ICD-10-CM | POA: Diagnosis present

## 2016-02-18 HISTORY — PX: LAPAROSCOPIC GASTRIC SLEEVE RESECTION: SHX5895

## 2016-02-18 LAB — HEMOGLOBIN AND HEMATOCRIT, BLOOD
HCT: 39.8 % (ref 36.0–46.0)
Hemoglobin: 12.8 g/dL (ref 12.0–15.0)

## 2016-02-18 LAB — GLUCOSE, CAPILLARY
Glucose-Capillary: 174 mg/dL — ABNORMAL HIGH (ref 65–99)
Glucose-Capillary: 141 mg/dL — ABNORMAL HIGH (ref 65–99)
Glucose-Capillary: 134 mg/dL — ABNORMAL HIGH (ref 65–99)
Glucose-Capillary: 139 mg/dL — ABNORMAL HIGH (ref 65–99)

## 2016-02-18 LAB — PREGNANCY, URINE: Preg Test, Ur: NEGATIVE

## 2016-02-18 SURGERY — GASTRECTOMY, SLEEVE, LAPAROSCOPIC
Anesthesia: General | Site: Abdomen

## 2016-02-18 MED ORDER — CHLORHEXIDINE GLUCONATE 4 % EX LIQD: 60.0000 mL | Freq: Once | CUTANEOUS | Status: DC

## 2016-02-18 MED ORDER — ONDANSETRON HCL 4 MG/2ML IJ SOLN
4.0000 mg | INTRAMUSCULAR | Status: DC | PRN
  Administered 2016-02-18 – 2016-02-20 (×4): 4 mg via INTRAVENOUS
  Filled 2016-02-18 (×4): qty 2

## 2016-02-18 MED ORDER — HYDROMORPHONE HCL 1 MG/ML IJ SOLN
0.2500 mg | INTRAMUSCULAR | Status: DC | PRN
  Administered 2016-02-18: 0.25 mg via INTRAVENOUS

## 2016-02-18 MED ORDER — PREMIER PROTEIN SHAKE
2.0000 [oz_av] | Freq: Four times a day (QID) | ORAL | Status: DC
Start: 2016-02-20 — End: 2016-02-20
  Administered 2016-02-20 (×3): 2 [oz_av] via ORAL

## 2016-02-18 MED ORDER — KCL IN DEXTROSE-NACL 20-5-0.45 MEQ/L-%-% IV SOLN
INTRAVENOUS | Status: DC
  Administered 2016-02-18: 125 mL/h via INTRAVENOUS
  Administered 2016-02-18 – 2016-02-19 (×2): via INTRAVENOUS
  Administered 2016-02-19: 1000 mL via INTRAVENOUS
  Administered 2016-02-19: 18:00:00 via INTRAVENOUS
  Administered 2016-02-20: 1000 mL via INTRAVENOUS
  Filled 2016-02-18 (×7): qty 1000

## 2016-02-18 MED ORDER — PROPOFOL 10 MG/ML IV BOLUS
INTRAVENOUS | Status: DC | PRN
  Administered 2016-02-18: 200 mg via INTRAVENOUS

## 2016-02-18 MED ORDER — SODIUM CHLORIDE 0.9 % IJ SOLN
INTRAMUSCULAR | Status: AC
  Filled 2016-02-18: qty 50

## 2016-02-18 MED ORDER — PHENYLEPHRINE HCL 10 MG/ML IJ SOLN
INTRAMUSCULAR | Status: AC
  Filled 2016-02-18: qty 1

## 2016-02-18 MED ORDER — MORPHINE SULFATE (PF) 2 MG/ML IV SOLN
2.0000 mg | INTRAVENOUS | Status: DC | PRN
  Administered 2016-02-18: 2 mg via INTRAVENOUS
  Filled 2016-02-18 (×3): qty 1

## 2016-02-18 MED ORDER — HYDROMORPHONE HCL 1 MG/ML IJ SOLN
INTRAMUSCULAR | Status: AC
  Filled 2016-02-18: qty 1

## 2016-02-18 MED ORDER — LACTATED RINGERS IR SOLN
Status: DC | PRN
  Administered 2016-02-18: 1000 mL

## 2016-02-18 MED ORDER — ACETAMINOPHEN 160 MG/5ML PO SOLN
650.0000 mg | ORAL | Status: DC | PRN
Start: 2016-02-19 — End: 2016-02-20

## 2016-02-18 MED ORDER — ENOXAPARIN SODIUM 30 MG/0.3ML ~~LOC~~ SOLN
30.0000 mg | Freq: Two times a day (BID) | SUBCUTANEOUS | Status: DC
  Administered 2016-02-19 – 2016-02-20 (×3): 30 mg via SUBCUTANEOUS
  Filled 2016-02-18 (×6): qty 0.3

## 2016-02-18 MED ORDER — INSULIN ASPART 100 UNIT/ML ~~LOC~~ SOLN
0.0000 [IU] | SUBCUTANEOUS | Status: DC
  Administered 2016-02-18 – 2016-02-20 (×7): 2 [IU] via SUBCUTANEOUS

## 2016-02-18 MED ORDER — ACETAMINOPHEN 10 MG/ML IV SOLN
1000.0000 mg | Freq: Once | INTRAVENOUS | Status: AC
  Administered 2016-02-18: 1000 mg via INTRAVENOUS

## 2016-02-18 MED ORDER — PANTOPRAZOLE SODIUM 40 MG IV SOLR
40.0000 mg | Freq: Every day | INTRAVENOUS | Status: DC
  Administered 2016-02-18 – 2016-02-19 (×2): 40 mg via INTRAVENOUS
  Filled 2016-02-18 (×3): qty 40

## 2016-02-18 MED ORDER — SUCCINYLCHOLINE CHLORIDE 20 MG/ML IJ SOLN
INTRAMUSCULAR | Status: DC | PRN
  Administered 2016-02-18: 100 mg via INTRAVENOUS

## 2016-02-18 MED ORDER — CEFOTETAN DISODIUM-DEXTROSE 2-2.08 GM-% IV SOLR
2.0000 g | INTRAVENOUS | Status: AC
  Administered 2016-02-18: 2 g via INTRAVENOUS

## 2016-02-18 MED ORDER — LIDOCAINE HCL (CARDIAC) 20 MG/ML IV SOLN
INTRAVENOUS | Status: AC
  Filled 2016-02-18: qty 5

## 2016-02-18 MED ORDER — BUPIVACAINE LIPOSOME 1.3 % IJ SUSP
INTRAMUSCULAR | Status: DC | PRN
  Administered 2016-02-18: 20 mL

## 2016-02-18 MED ORDER — BUPIVACAINE-EPINEPHRINE 0.25% -1:200000 IJ SOLN
INTRAMUSCULAR | Status: AC
  Filled 2016-02-18: qty 1

## 2016-02-18 MED ORDER — ROCURONIUM BROMIDE 50 MG/5ML IV SOLN
INTRAVENOUS | Status: AC
  Filled 2016-02-18: qty 1

## 2016-02-18 MED ORDER — FENTANYL CITRATE (PF) 100 MCG/2ML IJ SOLN
INTRAMUSCULAR | Status: DC | PRN
  Administered 2016-02-18 (×2): 50 ug via INTRAVENOUS
  Administered 2016-02-18: 100 ug via INTRAVENOUS
  Administered 2016-02-18: 50 ug via INTRAVENOUS

## 2016-02-18 MED ORDER — CEFOTETAN DISODIUM-DEXTROSE 2-2.08 GM-% IV SOLR
INTRAVENOUS | Status: AC
  Filled 2016-02-18: qty 50

## 2016-02-18 MED ORDER — OXYCODONE HCL 5 MG/5ML PO SOLN
5.0000 mg | ORAL | Status: DC | PRN
  Administered 2016-02-19 – 2016-02-20 (×3): 10 mg via ORAL
  Filled 2016-02-18 (×3): qty 10

## 2016-02-18 MED ORDER — ACETAMINOPHEN 160 MG/5ML PO SOLN: 325.0000 mg | ORAL | Status: DC | PRN

## 2016-02-18 MED ORDER — SODIUM CHLORIDE 0.9 % IJ SOLN
INTRAMUSCULAR | Status: DC | PRN
  Administered 2016-02-18: 50 mL via INTRAVENOUS

## 2016-02-18 MED ORDER — HEPARIN SODIUM (PORCINE) 5000 UNIT/ML IJ SOLN
5000.0000 [IU] | INTRAMUSCULAR | Status: AC
  Administered 2016-02-18: 5000 [IU] via SUBCUTANEOUS
  Filled 2016-02-18: qty 1

## 2016-02-18 MED ORDER — MIDAZOLAM HCL 5 MG/5ML IJ SOLN
INTRAMUSCULAR | Status: DC | PRN
  Administered 2016-02-18: 2 mg via INTRAVENOUS

## 2016-02-18 MED ORDER — SUGAMMADEX SODIUM 200 MG/2ML IV SOLN
INTRAVENOUS | Status: DC | PRN
  Administered 2016-02-18: 400 mg via INTRAVENOUS

## 2016-02-18 MED ORDER — SODIUM CHLORIDE 0.9 % IJ SOLN
INTRAMUSCULAR | Status: AC
  Filled 2016-02-18: qty 10

## 2016-02-18 MED ORDER — SUGAMMADEX SODIUM 500 MG/5ML IV SOLN
INTRAVENOUS | Status: AC
  Filled 2016-02-18: qty 5

## 2016-02-18 MED ORDER — ACETAMINOPHEN 10 MG/ML IV SOLN
1000.0000 mg | Freq: Four times a day (QID) | INTRAVENOUS | Status: AC
  Administered 2016-02-18 – 2016-02-19 (×4): 1000 mg via INTRAVENOUS
  Filled 2016-02-18 (×4): qty 100

## 2016-02-18 MED ORDER — MIDAZOLAM HCL 2 MG/2ML IJ SOLN
INTRAMUSCULAR | Status: AC
  Filled 2016-02-18: qty 2

## 2016-02-18 MED ORDER — FENTANYL CITRATE (PF) 250 MCG/5ML IJ SOLN
INTRAMUSCULAR | Status: AC
  Filled 2016-02-18: qty 5

## 2016-02-18 MED ORDER — ONDANSETRON HCL 4 MG/2ML IJ SOLN
INTRAMUSCULAR | Status: DC | PRN
  Administered 2016-02-18: 4 mg via INTRAVENOUS

## 2016-02-18 MED ORDER — LIDOCAINE HCL (CARDIAC) 20 MG/ML IV SOLN
INTRAVENOUS | Status: DC | PRN
  Administered 2016-02-18: 100 mg via INTRAVENOUS

## 2016-02-18 MED ORDER — PROCHLORPERAZINE EDISYLATE 5 MG/ML IJ SOLN: 10.0000 mg | Freq: Four times a day (QID) | INTRAMUSCULAR | Status: DC | PRN

## 2016-02-18 MED ORDER — ACETAMINOPHEN 10 MG/ML IV SOLN
INTRAVENOUS | Status: AC
  Filled 2016-02-18: qty 100

## 2016-02-18 MED ORDER — METOPROLOL TARTRATE 5 MG/5ML IV SOLN
5.0000 mg | Freq: Three times a day (TID) | INTRAVENOUS | Status: DC
  Administered 2016-02-18 – 2016-02-20 (×6): 5 mg via INTRAVENOUS
  Filled 2016-02-18 (×6): qty 5

## 2016-02-18 MED ORDER — BUPIVACAINE LIPOSOME 1.3 % IJ SUSP
Freq: Once | INTRAMUSCULAR | Status: DC
  Filled 2016-02-18 (×2): qty 20

## 2016-02-18 MED ORDER — DEXAMETHASONE SODIUM PHOSPHATE 10 MG/ML IJ SOLN
INTRAMUSCULAR | Status: AC
  Filled 2016-02-18: qty 1

## 2016-02-18 MED ORDER — ROCURONIUM BROMIDE 100 MG/10ML IV SOLN
INTRAVENOUS | Status: DC | PRN
  Administered 2016-02-18: 10 mg via INTRAVENOUS
  Administered 2016-02-18: 40 mg via INTRAVENOUS
  Administered 2016-02-18: 10 mg via INTRAVENOUS

## 2016-02-18 MED ORDER — METOCLOPRAMIDE HCL 5 MG/ML IJ SOLN
10.0000 mg | Freq: Three times a day (TID) | INTRAMUSCULAR | Status: DC | PRN
Start: 2016-02-18 — End: 2016-02-20
  Administered 2016-02-18 – 2016-02-19 (×2): 10 mg via INTRAVENOUS
  Filled 2016-02-18 (×3): qty 2

## 2016-02-18 MED ORDER — LACTATED RINGERS IV SOLN
INTRAVENOUS | Status: DC | PRN
  Administered 2016-02-18 (×2): via INTRAVENOUS

## 2016-02-18 MED ORDER — PHENYLEPHRINE HCL 10 MG/ML IJ SOLN
INTRAMUSCULAR | Status: DC | PRN
Start: 2016-02-18 — End: 2016-02-18
  Administered 2016-02-18 (×5): 80 ug via INTRAVENOUS

## 2016-02-18 MED ORDER — ONDANSETRON HCL 4 MG/2ML IJ SOLN
INTRAMUSCULAR | Status: AC
  Filled 2016-02-18: qty 2

## 2016-02-18 MED ORDER — EPHEDRINE SULFATE 50 MG/ML IJ SOLN
INTRAMUSCULAR | Status: AC
  Filled 2016-02-18: qty 1

## 2016-02-18 MED ORDER — PROPOFOL 10 MG/ML IV BOLUS
INTRAVENOUS | Status: AC
  Filled 2016-02-18: qty 20

## 2016-02-18 SURGICAL SUPPLY — 65 items
ADH SKN CLS APL DERMABOND .7 (GAUZE/BANDAGES/DRESSINGS) ×1
APL SRG 32X5 SNPLK LF DISP (MISCELLANEOUS)
APPLICATOR COTTON TIP 6IN STRL (MISCELLANEOUS) IMPLANT
APPLIER CLIP ROT 10 11.4 M/L (STAPLE)
APR CLP MED LRG 11.4X10 (STAPLE)
BLADE SURG SZ11 CARB STEEL (BLADE) ×2 IMPLANT
CABLE HIGH FREQUENCY MONO STRZ (ELECTRODE) IMPLANT
CHLORAPREP W/TINT 26ML (MISCELLANEOUS) ×4 IMPLANT
CLIP APPLIE ROT 10 11.4 M/L (STAPLE) IMPLANT
COVER SURGICAL LIGHT HANDLE (MISCELLANEOUS) ×2 IMPLANT
DERMABOND ADVANCED (GAUZE/BANDAGES/DRESSINGS) ×1
DERMABOND ADVANCED .7 DNX12 (GAUZE/BANDAGES/DRESSINGS) ×1 IMPLANT
DEVICE SUT QUICK LOAD TK 5 (STAPLE) IMPLANT
DEVICE SUT TI-KNOT TK 5X26 (MISCELLANEOUS) IMPLANT
DEVICE SUTURE ENDOST 10MM (ENDOMECHANICALS) IMPLANT
DEVICE TROCAR PUNCTURE CLOSURE (ENDOMECHANICALS) IMPLANT
DRAPE UTILITY XL STRL (DRAPES) ×4 IMPLANT
ELECT REM PT RETURN 9FT ADLT (ELECTROSURGICAL) ×2
ELECTRODE REM PT RTRN 9FT ADLT (ELECTROSURGICAL) ×1 IMPLANT
GAUZE SPONGE 4X4 12PLY STRL (GAUZE/BANDAGES/DRESSINGS) IMPLANT
GLOVE BIO SURGEON STRL SZ7.5 (GLOVE) ×2 IMPLANT
GLOVE INDICATOR 8.0 STRL GRN (GLOVE) ×2 IMPLANT
GOWN STRL REUS W/TWL XL LVL3 (GOWN DISPOSABLE) ×6 IMPLANT
HOVERMATT SINGLE USE (MISCELLANEOUS) ×2 IMPLANT
KIT BASIN OR (CUSTOM PROCEDURE TRAY) ×2 IMPLANT
MARKER SKIN DUAL TIP RULER LAB (MISCELLANEOUS) ×2 IMPLANT
NDL SPNL 22GX3.5 QUINCKE BK (NEEDLE) ×1 IMPLANT
NEEDLE SPNL 22GX3.5 QUINCKE BK (NEEDLE) ×2 IMPLANT
PACK UNIVERSAL I (CUSTOM PROCEDURE TRAY) ×2 IMPLANT
RELOAD STAPLE 60 3.6 BLU REG (STAPLE) IMPLANT
RELOAD STAPLE 60 3.8 GOLD REG (STAPLE) IMPLANT
RELOAD STAPLE 60 4.1 GRN THCK (STAPLE) IMPLANT
RELOAD STAPLER BLUE 60MM (STAPLE) ×2 IMPLANT
RELOAD STAPLER GOLD 60MM (STAPLE) ×1 IMPLANT
RELOAD STAPLER GREEN 60MM (STAPLE) ×2 IMPLANT
SCISSORS LAP 5X45 EPIX DISP (ENDOMECHANICALS) ×2 IMPLANT
SEALANT SURGICAL APPL DUAL CAN (MISCELLANEOUS) IMPLANT
SET IRRIG TUBING LAPAROSCOPIC (IRRIGATION / IRRIGATOR) ×2 IMPLANT
SHEARS HARMONIC ACE PLUS 45CM (MISCELLANEOUS) ×2 IMPLANT
SLEEVE ADV FIXATION 5X100MM (TROCAR) ×4 IMPLANT
SLEEVE GASTRECTOMY 40FR VISIGI (MISCELLANEOUS) ×2 IMPLANT
SLEEVE XCEL OPT CAN 5 100 (ENDOMECHANICALS) ×1 IMPLANT
SOLUTION ANTI FOG 6CC (MISCELLANEOUS) ×2 IMPLANT
SPONGE LAP 18X18 X RAY DECT (DISPOSABLE) ×2 IMPLANT
STAPLER ECHELON BIOABSB 60 FLE (MISCELLANEOUS) ×7 IMPLANT
STAPLER ECHELON LONG 60 440 (INSTRUMENTS) ×1 IMPLANT
STAPLER RELOAD BLUE 60MM (STAPLE) ×4
STAPLER RELOAD GOLD 60MM (STAPLE) ×2
STAPLER RELOAD GREEN 60MM (STAPLE) ×4
SUT MNCRL AB 4-0 PS2 18 (SUTURE) ×2 IMPLANT
SUT SURGIDAC NAB ES-9 0 48 120 (SUTURE) IMPLANT
SUT VICRYL 0 TIES 12 18 (SUTURE) ×2 IMPLANT
SYR 10ML ECCENTRIC (SYRINGE) ×2 IMPLANT
SYR 20CC LL (SYRINGE) ×2 IMPLANT
SYR 50ML LL SCALE MARK (SYRINGE) ×2 IMPLANT
TOWEL OR 17X26 10 PK STRL BLUE (TOWEL DISPOSABLE) ×2 IMPLANT
TOWEL OR NON WOVEN STRL DISP B (DISPOSABLE) ×2 IMPLANT
TRAY FOLEY W/METER SILVER 14FR (SET/KITS/TRAYS/PACK) IMPLANT
TRAY FOLEY W/METER SILVER 16FR (SET/KITS/TRAYS/PACK) IMPLANT
TROCAR ADV FIXATION 5X100MM (TROCAR) ×2 IMPLANT
TROCAR BLADELESS 15MM (ENDOMECHANICALS) ×2 IMPLANT
TROCAR BLADELESS OPT 5 100 (ENDOMECHANICALS) ×2 IMPLANT
TUBING CONNECTING 10 (TUBING) ×2 IMPLANT
TUBING ENDO SMARTCAP (MISCELLANEOUS) ×2 IMPLANT
TUBING INSUF HEATED (TUBING) ×2 IMPLANT

## 2016-02-18 NOTE — Transfer of Care (Signed)
Immediate Anesthesia Transfer of Care Note  Patient: Kaylee Montgomery  Procedure(s) Performed: Procedure(s): LAPAROSCOPIC GASTRIC SLEEVE RESECTION, UPPER ENDO (N/A)  Patient Location: PACU  Anesthesia Type:General  Level of Consciousness:  sedated, patient cooperative and responds to stimulation  Airway & Oxygen Therapy:Patient Spontanous Breathing and Patient connected to face mask oxgen  Post-op Assessment:  Report given to PACU RN and Post -op Vital signs reviewed and stable  Post vital signs:  Reviewed and stable  Last Vitals:  Filed Vitals:   02/18/16 0525  BP: 112/78  Pulse: 88  Temp: 37 C  Resp: 18    Complications: No apparent anesthesia complications

## 2016-02-18 NOTE — Op Note (Signed)
02/18/2016 Kaylee Montgomery 08-05-1981 [id number]   PRE-OPERATIVE DIAGNOSIS:     Morbid obesity    Essential hypertension   Prediabetes   Obstructive sleep apnea   Common migraine with intractable migraine   Fatty liver  POST-OPERATIVE DIAGNOSIS:  same  PROCEDURE:  Procedure(s): LAPAROSCOPIC SLEEVE GASTRECTOMY  UPPER GI ENDOSCOPY  SURGEON:  Surgeon(s): [name], MD FACS FASMBS  ASSISTANTS: Johnathan Hausen MD FACS  ANESTHESIA:   general  DRAINS: none   BOUGIE: 28 fr ViSiGi  LOCAL MEDICATIONS USED:  MARCAINE + Exparel  SPECIMEN:  Source of Specimen:  Greater curvature of stomach  DISPOSITION OF SPECIMEN:  PATHOLOGY  COUNTS:  YES  INDICATION FOR PROCEDURE: This is a very pleasant 35 year old morbidly obese female who has had unsuccessful attempts for sustained weight loss. She presents today for a planned laparoscopic sleeve gastrectomy with upper endoscopy. We have discussed the risk and benefits of the procedure extensively preoperatively. Please see my separate notes.  PROCEDURE: After obtaining informed consent and receiving 5000 units of subcutaneous heparin, the patient was brought to the operating room at [facility] and placed supine on the operating room table. General endotracheal anesthesia was established. Sequential compression devices were placed. A orogastric tube was placed. The patient's abdomen was prepped and draped in the usual standard surgical fashion. She received preoperative IV antibiotics. A surgical timeout was performed.  Access to the abdomen was achieved using a 5 mm 0 laparoscope thru a 5 mm trocar In the left upper Quadrant 2 fingerbreadths below the left subcostal margin using the Optiview technique. Pneumoperitoneum was smoothly established up to 15 mm of mercury. The laparoscope was advanced and the abdominal cavity was surveilled. The patient was then placed in reverse Trendelenburg. There was no evidence of a hiatal hernia on  laparoscopy - gap in the left and right crus anteriorly.  A 5 mm trocar was placed slightly above and to the left of the umbilicus under direct visualization.  The [facility] liver retractor was placed under the left lobe of the liver through a 5 mm trocar incision site in the subxiphoid position. A 5 mm trocar was placed in the lateral right upper quadrant along with a 15 mm trocar in the mid right abdomen. A final 5 mm trocar was placed in the lateral LUQ.  All under direct visualization after local had been infiltrated.  The stomach was inspected. It was completely decompressed and the orogastric tube was removed.  There was no anterior dimple that was obviously visible. Her preop UGI showed no hiatal hernia. The calibration tube was placed in the oropharynx and guided down into the stomach by the CRNA. 10 mL of air was insufflated into the calibration balloon. The calibration tubing was then gently pulled back by the CRNA and it did not slide past the GE junction. There was no evidence of a hiatal hernia.   We identified the pylorus and measured 6 cm proximal to the pylorus and identified an area of where we would start taking down the short gastric vessels. Harmonic scalpel was used to take down the short gastric vessels along the greater curvature of the stomach. We were able to enter the lesser sac. We continued to march along the greater curvature of the stomach taking down the short gastrics. As we approached the gastrosplenic ligament we took care in this area not to injure the spleen. We were able to take down the entire gastrosplenic ligament. We then mobilized the fundus away from the  left crus of diaphragm. There were not any significant posterior gastric avascular attachments. This left the stomach completely mobilized. No vessels had been taken down along the lesser curvature of the stomach.  We then reidentified the pylorus. A 40Fr ViSiGi was then placed in the oropharynx and advanced down  into the stomach and placed in the distal antrum and positioned along the lesser curvature. It was placed under suction which secured the 40Fr ViSiGi in place along the lesser curve. Then using the [employer] echelon 60 mm stapler with a green load with Seamguard, I placed a stapler along the antrum approximately 5 cm from the pylorus. The stapler was angled so that there is ample room at the angularis incisura. I then fired the first staple load after inspecting it posteriorly to ensure adequate space both anteriorly and posteriorly. At this point I still was not completely past the angularis so with another green load with Seamguard, I placed the stapler in position just inside the prior stapleline. We then rotated the stomach to insure that there was adequate anteriorly as well as posteriorly. The stapler was then fired.  At this point I started using 60 mm gold load staple cartridges with Seamguard. The echelon stapler was then repositioned with a 60 mm gold load with Seamguard and we continued to march up along the [place]. My assistant was holding traction along the greater curvature stomach along the cauterized short gastric vessels ensuring that the stomach was symmetrically retracted. Prior to each firing of the staple, we rotated the stomach to ensure that there is adequate stomach left.  As we approached the fundus, I used 60 mm blue cartridge with Seamguard aiming slightly lateral to the esophageal fat pad. Although the staples on this fire had completely gone thru the last part of the stomach it had not completely cut it. Therefore 1 additional 60 blue load was used to free the remaining stomach. The sleeve was inspected. There is no evidence of cork screw. The staple line appeared hemostatic. The CRNA inflated the ViSiGi to the green zone and the upper abdomen was flooded with saline. There were no bubbles. The sleeve was decompressed and the ViSiGi removed. My assistant scrubbed out and performed an upper  endoscopy. The sleeve easily distended with air and the scope was easily advanced to the pylorus. There is no evidence of internal bleeding or cork screwing. There was no narrowing at the angularis. There is no evidence of bubbles. Please see his operative note for further details. The gastric sleeve was decompressed and the endoscope was removed.  The greater curvature the stomach was grasped with a laparoscopic grasper and removed from the 15 mm trocar site.  The liver retractor was removed. I then closed the 15 mm trocar site with 1 interrupted 0 Vicryl sutures through the fascia using the endoclose. The closure was viewed laparoscopically and it was airtight. 70 cc of Exparel was then infiltrated in the preperitoneal spaces around the trocar sites. Pneumoperitoneum was released. All trocar sites were closed with a 4-0 Monocryl in a subcuticular fashion followed by the application of Dermabond. The patient was extubated and taken to the recovery room in stable condition. All needle, instrument, and sponge counts were correct x2. There are no immediate complications  (2) 60 mm green with Seamguard (1) 60 mm gold with seamguard (2) 60 mm blue with  seamguard  PLAN OF CARE: Admit to inpatient   PATIENT DISPOSITION:  PACU - hemodynamically stable.   Delay start of  Pharmacological VTE agent (>24hrs) due to surgical blood loss or risk of bleeding:  no  Leighton Ruff. Redmond Pulling, MD, FACS FASMBS General, Bariatric, & Minimally Invasive Surgery [facility], [place]

## 2016-02-18 NOTE — Anesthesia Preprocedure Evaluation (Addendum)
Anesthesia Evaluation  Patient identified by MRN, date of birth, ID band Patient awake    Reviewed: Allergy & Precautions, NPO status   Airway Mallampati: II  TM Distance: >3 FB Neck ROM: Full    Dental   Pulmonary sleep apnea ,    breath sounds clear to auscultation       Cardiovascular hypertension, Pt. on medications  Rhythm:Regular Rate:Normal     Neuro/Psych    GI/Hepatic negative GI ROS, Neg liver ROS, GERD  Medicated,  Endo/Other  negative endocrine ROS  Renal/GU negative Renal ROS     Musculoskeletal   Abdominal   Peds  Hematology   Anesthesia Other Findings   Reproductive/Obstetrics                          Anesthesia Physical Anesthesia Plan  ASA: III  Anesthesia Plan: General   Post-op Pain Management:    Induction: Intravenous  Airway Management Planned: Oral ETT  Additional Equipment:   Intra-op Plan:   Post-operative Plan: Extubation in OR  Informed Consent: I have reviewed the patients History and Physical, chart, labs and discussed the procedure including the risks, benefits and alternatives for the proposed anesthesia with the patient or authorized representative who has indicated his/her understanding and acceptance.   Dental advisory given  Plan Discussed with: CRNA and Anesthesiologist  Anesthesia Plan Comments:         Anesthesia Quick Evaluation

## 2016-02-18 NOTE — Anesthesia Procedure Notes (Signed)
Procedure Name: Intubation Date/Time: 02/18/2016 7:23 AM Performed by: Maxwell Caul Pre-anesthesia Checklist: Patient identified, Emergency Drugs available, Suction available and Patient being monitored Patient Re-evaluated:Patient Re-evaluated prior to inductionOxygen Delivery Method: Circle System Utilized Preoxygenation: Pre-oxygenation with 100% oxygen Intubation Type: IV induction Ventilation: Mask ventilation without difficulty Laryngoscope Size: Mac and 4 Grade View: Grade II Tube type: Oral Tube size: 7.5 mm Number of attempts: 1 Airway Equipment and Method: Stylet Placement Confirmation: ETT inserted through vocal cords under direct vision,  positive ETCO2 and breath sounds checked- equal and bilateral Secured at: 21 cm Tube secured with: Tape Dental Injury: Teeth and Oropharynx as per pre-operative assessment

## 2016-02-18 NOTE — Anesthesia Postprocedure Evaluation (Signed)
Anesthesia Post Note  Patient: Kaylee Montgomery  Procedure(s) Performed: Procedure(s) (LRB): LAPAROSCOPIC GASTRIC SLEEVE RESECTION, UPPER ENDO (N/A)  Patient location during evaluation: PACU Anesthesia Type: General Level of consciousness: awake Pain management: pain level controlled Vital Signs Assessment: post-procedure vital signs reviewed and stable Respiratory status: spontaneous breathing Cardiovascular status: stable Anesthetic complications: no    Last Vitals:  Filed Vitals:   02/18/16 1003 02/18/16 1100  BP: 114/93 161/103  Pulse: 93 91  Temp: 36.5 C   Resp: 18     Last Pain:  Filed Vitals:   02/18/16 1122  PainSc: Asleep                 EDWARDS,Rena Hunke

## 2016-02-18 NOTE — Op Note (Signed)
Kaylee Montgomery [id number] Oct 01, 1981 02/18/2016  Preoperative diagnosis: sleeve gastrectomy in progress  Postoperative diagnosis: Same   Procedure: Upper endoscopy   Surgeon: Catalina Antigua B. Hassell Done  M.D., FACS   Anesthesia: Gen.   Indications for procedure: This patient was undergoing a sleeve gastrostomy and endoscopy to check for bleeding and leaks.    Description of procedure: The endoscopy was placed in the mouth and into the oropharynx and under endoscopic vision it was advanced to the esophagogastric junction.  The pouch was insufflated and passed down to the pylorus.   No bleeding or leaks were detected.  The scope was withdrawn without difficulty.     Matt B. Hassell Done, MD, FACS General, Bariatric, & Minimally Invasive Surgery [facility], [place]

## 2016-02-18 NOTE — H&P (Signed)
Kaylee Montgomery is an 35 y.o. female.   Chief Complaint: here for surgery HPI: The patient is a 35 year old female who presents for a bariatric surgery evaluation. She is referred by Dr Moshe Cipro for evaluation of weight loss surgery. She is specifically interested in a sleeve gastrectomy. Several coworkers and friends have had the surgery and have been successful. Her primary motivation for weight loss surgery is to become healthier. She wants to be more active. She completed our Neurosurgeon. She states that she has struggled with her weight for the past 20 years ever since she became pregnant. Despite numerous attempts for sustained weight loss she has been unsuccessful. She has been to bariatric clinics and on B12, beta hCG injections. She has tried low-calorie and low carbohydrate diets. She has also seen a nutritionist in the past. She is also been on phentermine in the past as well. All of these efforts did not result in any long-term significant sustained weight loss.  Her comorbidities include hypertension, obstructive sleep apnea , prediabetes, migraines, and right shoulder pain.  She denies any chest pain, chest pressure, shortness of breath, dyspnea on exertion, orthopnea, paroxysmal nocturnal dyspnea. She denies any prior blood clots. She states that she does have sleep apnea. She is in the process of getting fitted for a mouthpiece. She had trouble with the nasal piece. She denies any reflux. She may have it on a rare occasion. She denies any abdominal pain. She denies any melena or hematochezia. She denies constipation. She reports daily bowel movements. She has had some mildly elevated AST and ALT labs in the past. She has had a C-section. She is a G1 P1.  She does endorse some right shoulder pain. She states that she has been told she is prediabetic. She does have migraines and takes propranolol. She denies any tobacco or drugs. She drinks alcohol on a social  occasion. She works for [employer].  She states that she was diagnosed with pituitary gland tumor. This was a few years ago. She started having spontaneous bilateral nipple discharge. 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 They did not operate.  She had blood work done in March 2016. Her hemoglobin A1c was 6.2. Her comprehensive metabolic panel was within normal limits except for an ALT level of 67. Her TSH level was normal at 1.74  Her UGI was normal. CXR was normal.  Past Medical History  Diagnosis Date  . Pituitary adenoma ([place])   . Gynecomastia   . ALLERGIC RHINITIS   . Sinusitis   . Obesity   . Hypertension   . Sleep apnea     uses cpap occasionally  . Headache(784.0)   . Common migraine with intractable migraine 02/23/2015  . Complication of anesthesia     high tolererance to meds, takes alot sometimes with c section, and dental work    Past Surgical History  Procedure Laterality Date  . Caesarean      x 1  . Corrective surgery from tonsillectomy  age 84  . Tonsillectomy  age 26    Family History  Problem Relation Age of Onset  . Diabetes Maternal Grandfather   . Heart disease Father     stent  . Migraines Father   . Migraines Son    Social History:  reports that she has never smoked. She has never used smokeless tobacco. She reports that she drinks alcohol. She reports that she does not use illicit drugs.  Allergies:  Allergies  Allergen  Reactions  . Bromocriptine Mesylate     REACTION: nausea  . Depo-Medrol [Methylprednisolone Acetate]     Reports itching and redness at the injection site  . Imitrex [Sumatriptan] Other (See Comments)    Severe upset stomach, nausea feelings    Medications Prior to Admission  Medication Sig Dispense Refill  . cyclobenzaprine (FLEXERIL) 10 MG tablet One tablet at bedtime, for muscle spasm , for 5 days, then as  needed (Patient taking differently: Take 10 mg by mouth at bedtime as needed for muscle spasms. One tablet  at bedtime, for muscle spasm , for 5 days, then as  needed) 30 tablet 0  . fluticasone (FLONASE) 50 MCG/ACT nasal spray Place 1 spray into both nostrils daily.    Marland Kitchen gabapentin (NEURONTIN) 300 MG capsule Take 1 capsule (300 mg total) by mouth 2 (two) times daily. (Patient taking differently: Take 300 mg by mouth at bedtime. ) 60 capsule 11  . metoprolol succinate (TOPROL-XL) 25 MG 24 hr tablet Take 1 tablet (25 mg total) by mouth daily. (Patient taking differently: Take 25 mg by mouth at bedtime. ) 30 tablet 11  . mupirocin cream (BACTROBAN) 2 % Apply 1 application topically 2 (two) times daily.    . sodium chloride (OCEAN) 0.65 % SOLN nasal spray Place 1 spray into both nostrils as needed for congestion.    . triamterene-hydrochlorothiazide (MAXZIDE-25) 37.5-25 MG tablet Take 1 tablet by mouth daily. (Patient taking differently: Take 1 tablet by mouth at bedtime. ) 90 tablet 1  . ibuprofen (ADVIL,MOTRIN) 800 MG tablet Take 1 tablet (800 mg total) by mouth 3 (three) times daily. (Patient taking differently: Take 800 mg by mouth as needed. ) 21 tablet 0  . ranitidine (ZANTAC) 300 MG tablet Take 1 tablet (300 mg total) by mouth at bedtime. 14 tablet 0    Results for orders placed or performed during the hospital encounter of 02/18/16 (from the past 48 hour(s))  Pregnancy, urine STAT morning of surgery     Status: None   Collection Time: 02/18/16  5:50 AM  Result Value Ref Range   Preg Test, Ur NEGATIVE NEGATIVE    Comment:        THE SENSITIVITY OF THIS METHODOLOGY IS >20 mIU/mL.    No results found.  Review of Systems  Constitutional: Negative for weight loss.  HENT: Negative for nosebleeds.   Eyes: Negative for blurred vision.  Respiratory: Negative for shortness of breath.        +osa but hasn't been using CPAP for awhile  Cardiovascular: Negative for chest pain, palpitations, orthopnea and PND.       Denies DOE  Gastrointestinal: Negative for heartburn, nausea, vomiting and abdominal  pain.  Genitourinary: Negative for dysuria and hematuria.  Musculoskeletal: Positive for joint pain.  Skin: Negative for itching and rash.  Neurological: Positive for headaches (chronic migraines). Negative for dizziness, focal weakness, seizures and loss of consciousness.       Denies TIAs, amaurosis fugax  Endo/Heme/Allergies: Does not bruise/bleed easily.  Psychiatric/Behavioral: The patient is not nervous/anxious.     Blood pressure 112/78, pulse 88, temperature 98.6 F (37 C), temperature source Oral, resp. rate 18, height 5' 4.5" (1.638 m), weight 109.77 kg (242 lb), SpO2 96 %. Physical Exam  Vitals reviewed. Constitutional: She is oriented to person, place, and time. She appears well-developed and well-nourished. No distress.  HENT:  Head: Normocephalic and atraumatic.  Right Ear: External ear normal.  Left Ear: External ear normal.  Eyes: Conjunctivae  are normal. No scleral icterus.  Neck: Normal range of motion. Neck supple. No tracheal deviation present. No thyromegaly present.  Cardiovascular: Normal rate and normal heart sounds.   Respiratory: Effort normal and breath sounds normal. No stridor. No respiratory distress. She has no wheezes.  GI: Soft. She exhibits no distension. There is no tenderness.  Musculoskeletal: She exhibits no edema or tenderness.  Lymphadenopathy:    She has no cervical adenopathy.  Neurological: She is alert and oriented to person, place, and time. She exhibits normal muscle tone.  Skin: Skin is warm and dry. No rash noted. She is not diaphoretic. No erythema. No pallor.  Psychiatric: She has a normal mood and affect. Her behavior is normal. Judgment and thought content normal.     Assessment/Plan Morbid obesity Prediabetes htn osa Joint pain  To OR for sleeve gastrectomy, upper endo All questions asked and answered No hiatal hernia on UGI. No reflux.  Discussed typical postop course  Leighton Ruff. Redmond Pulling, MD, [place], Bariatric, &  Minimally Invasive Surgery [facility], [place]   [name], MD 02/18/2016, 7:14 AM

## 2016-02-18 NOTE — Progress Notes (Signed)
Vomited 50 cc reddish  brown liquid. Prn for nausea given

## 2016-02-19 LAB — COMPREHENSIVE METABOLIC PANEL
ALT: 82 U/L — ABNORMAL HIGH (ref 14–54)
AST: 53 U/L — ABNORMAL HIGH (ref 15–41)
Albumin: 4.5 g/dL (ref 3.5–5.0)
Alkaline Phosphatase: 38 U/L (ref 38–126)
Anion gap: 10 (ref 5–15)
BUN: <5 mg/dL — ABNORMAL LOW (ref 6–20)
CO2: 27 mmol/L (ref 22–32)
Calcium: 9.4 mg/dL (ref 8.9–10.3)
Chloride: 103 mmol/L (ref 101–111)
Creatinine, Ser: 0.66 mg/dL (ref 0.44–1.00)
GFR calc Af Amer: >60 mL/min (ref >60)
GFR calc non Af Amer: >60 mL/min (ref >60)
Glucose, Bld: 136 mg/dL — ABNORMAL HIGH (ref 65–99)
Potassium: 3.1 mmol/L — ABNORMAL LOW (ref 3.5–5.1)
Sodium: 140 mmol/L (ref 135–145)
Total Bilirubin: 1 mg/dL (ref 0.3–1.2)
Total Protein: 7.6 g/dL (ref 6.5–8.1)

## 2016-02-19 LAB — GLUCOSE, CAPILLARY
Glucose-Capillary: 109 mg/dL — ABNORMAL HIGH (ref 65–99)
Glucose-Capillary: 116 mg/dL — ABNORMAL HIGH (ref 65–99)
Glucose-Capillary: 118 mg/dL — ABNORMAL HIGH (ref 65–99)
Glucose-Capillary: 120 mg/dL — ABNORMAL HIGH (ref 65–99)
Glucose-Capillary: 130 mg/dL — ABNORMAL HIGH (ref 65–99)
Glucose-Capillary: 139 mg/dL — ABNORMAL HIGH (ref 65–99)
Glucose-Capillary: 139 mg/dL — ABNORMAL HIGH (ref 65–99)

## 2016-02-19 LAB — CBC WITH DIFFERENTIAL/PLATELET
Basophils Absolute: 0 10*3/uL (ref 0.0–0.1)
Basophils Relative: 0 %
Eosinophils Absolute: 0 10*3/uL (ref 0.0–0.7)
Eosinophils Relative: 0 %
HCT: 39.1 % (ref 36.0–46.0)
Hemoglobin: 12.7 g/dL (ref 12.0–15.0)
Lymphocytes Relative: 25 %
Lymphs Abs: 2.2 10*3/uL (ref 0.7–4.0)
MCH: 25.7 pg — ABNORMAL LOW (ref 26.0–34.0)
MCHC: 32.5 g/dL (ref 30.0–36.0)
MCV: 79 fL (ref 78.0–100.0)
Monocytes Absolute: 0.7 10*3/uL (ref 0.1–1.0)
Monocytes Relative: 8 %
Neutro Abs: 6.1 10*3/uL (ref 1.7–7.7)
Neutrophils Relative %: 67 %
Platelets: 437 10*3/uL — ABNORMAL HIGH (ref 150–400)
RBC: 4.95 MIL/uL (ref 3.87–5.11)
RDW: 13.2 % (ref 11.5–15.5)
WBC: 9.1 10*3/uL (ref 4.0–10.5)

## 2016-02-19 LAB — HEMOGLOBIN AND HEMATOCRIT, BLOOD
HCT: 36.2 % (ref 36.0–46.0)
Hemoglobin: 11.9 g/dL — ABNORMAL LOW (ref 12.0–15.0)

## 2016-02-19 MED ORDER — POTASSIUM CHLORIDE 10 MEQ/100ML IV SOLN
10.0000 meq | INTRAVENOUS | Status: AC
  Administered 2016-02-19 (×3): 10 meq via INTRAVENOUS
  Filled 2016-02-19 (×3): qty 100

## 2016-02-19 NOTE — Progress Notes (Signed)
Patient alert and oriented, Post op day 1.  Provided support and encouragement.  Encouraged pulmonary toilet, ambulation and small sips of liquids.  All questions answered.  Will continue to monitor. 

## 2016-02-19 NOTE — Progress Notes (Signed)
1 Day Post-Op  Subjective: Had some mild abd pain yesterday. Ambulated. Also had several small episodes of emesis yesterday but none overnight.   Objective: Vital signs in last 24 hours: Temp:  [97.7 F (36.5 C)-99.7 F (37.6 C)] 99.2 F (37.3 C) (05/02 0500) Pulse Rate:  [91-107] 92 (05/02 0500) Resp:  [18-22] 18 (05/02 0500) BP: (114-161)/(74-103) 146/88 mmHg (05/02 0500) SpO2:  [93 %-100 %] 98 % (05/02 0500)    Intake/Output from previous day: 05/01 0701 - 05/02 0700 In: 2800 [I.V.:2700; IV Piggyback:100] Out: 2300 [Urine:2300] Intake/Output this shift:    Awake, alert, nontoxic Symmetric chest rise Soft, approp TTP, incisions c/d/i No edema  Lab Results:   Recent Labs  02/18/16 0944 02/19/16 0440  WBC  --  9.1  HGB 12.8 12.7  HCT 39.8 39.1  PLT  --  437*   BMET  Recent Labs  02/19/16 0440  NA 140  K 3.1*  CL 103  CO2 27  GLUCOSE 136*  BUN <5*  CREATININE 0.66  CALCIUM 9.4   PT/INR No results for input(s): LABPROT, INR in the last 72 hours. ABG No results for input(s): PHART, HCO3 in the last 72 hours.  Invalid input(s): PCO2, PO2  Studies/Results: No results found.  Anti-infectives: Anti-infectives    Start     Dose/Rate Route Frequency Ordered Stop   02/18/16 0549  cefoTEtan in Dextrose 5% (CEFOTAN) IVPB 2 g     2 g Intravenous On call to O.R. 02/18/16 0549 02/18/16 0730      Assessment/Plan: s/p Procedure(s): LAPAROSCOPIC GASTRIC SLEEVE RESECTION, UPPER ENDO (N/A)  Nausea as expected but o/w looks well. No fever. Labs ok except for potassium. Will start POD 1 diet Cont pulm toilet, IS Chemical vte prophylaxis Hypokalemia - replace with IV potassium  Leighton Ruff. Redmond Pulling, MD, FACS General, Bariatric, & Minimally Invasive Surgery [facility], [place]   LOS: 1 day    [name] 02/19/2016

## 2016-02-19 NOTE — Discharge Instructions (Signed)
° ° ° °GASTRIC BYPASS/SLEEVE ° Home Care Instructions ° ° These instructions are to help you care for yourself when you go home. ° °Call: If you have any problems. °• Call [phone] and ask for the surgeon on call °• If you need immediate assistance come to the ER at [facility]. Tell the ER staff you are a new post-op gastric bypass or gastric sleeve patient  °Signs and symptoms to report: • Severe  vomiting or nausea °o If you cannot handle clear liquids for longer than 1 day, call your surgeon °• Abdominal pain which does not get better after taking your pain medication °• Fever greater than 100.4°  F and chills °• Heart rate over 100 beats a minute °• Trouble breathing °• Chest pain °• Redness,  swelling, drainage, or foul odor at incision (surgical) sites °• If your incisions open or pull apart °• Swelling or pain in calf (lower leg) °• Diarrhea (Loose bowel movements that happen often), frequent watery, uncontrolled bowel movements °• Constipation, (no bowel movements for 3 days) if this happens: °o Take Milk of Magnesia, 2 tablespoons by mouth, 3 times a day for 2 days if needed °o Stop taking Milk of Magnesia once you have had a bowel movement °o Call your doctor if constipation continues °Or °o Take Miralax  (instead of Milk of Magnesia) following the label instructions °o Stop taking Miralax once you have had a bowel movement °o Call your doctor if constipation continues °• Anything you think is “abnormal for you” °  °Normal side effects after surgery: • Unable to sleep at night or unable to concentrate °• Irritability °• Being tearful (crying) or depressed ° °These are common complaints, possibly related to your anesthesia, stress of surgery, and change in lifestyle, that usually go away a few weeks after surgery. If these feelings continue, call your medical doctor.  °Wound Care: You may have surgical glue, steri-strips, or staples over your incisions after surgery °• Surgical glue: Looks like clear  film over your incisions and will wear off a little at a time °• Steri-strips: Adhesive strips of tape over your incisions. You may notice a yellowish color on skin under the steri-strips. This is used to make the steri-strips stick better. Do not pull the steri-strips off - let them fall off °• Staples: Staples may be removed before you leave the hospital °o If you go home with staples, call [facility] for an appointment with your surgeon’s nurse to have staples removed 10 days after surgery, [phone] °• Showering: You may shower two (2) days after your surgery unless your surgeon tells you differently °o Wash gently around incisions with warm soapy water, rinse well, and gently pat dry °o If you have a drain (tube from your incision), you may need someone to hold this while you shower °o No tub baths until staples are removed and incisions are healed °  °Medications: • Medications should be liquid or crushed if larger than the size of a dime °• Extended release pills (medication that releases a little bit at a time through the  day) should not be crushed °• Depending on the size and number of medications you take, you may need to space (take a few throughout the day)/change the time you take your medications so that you do not over-fill your pouch (smaller stomach) °• Make sure you follow-up with you primary care physician to make medication changes needed during rapid weight loss and life -style changes °•   If you have diabetes, follow up with your doctor that orders your diabetes medication(s) within one week after surgery and check your blood sugar regularly   Do not drive while taking narcotics (pain medications)   Do not take acetaminophen (Tylenol) and Roxicet or Lortab Elixir at the same time since these pain medications contain acetaminophen   Diet:  First 2 Weeks You will see the nutritionist about two (2) weeks after your surgery. The nutritionist will increase the types of  foods you can eat if you are handling liquids well:  If you have severe vomiting or nausea and cannot handle clear liquids lasting longer than 1 day call your surgeon Protein Shake  Drink at least 2 ounces of shake 5-6 times per day  Each serving of protein shakes (usually 8-12 ounces) should have a minimum of: o 15 grams of protein o And no more than 5 grams of carbohydrate  Goal for protein each day: o Men = 80 grams per day o Women = 60 grams per day     Protein powder may be added to fluids such as non-fat milk or Lactaid milk or Soy milk (limit to 35 grams added protein powder per serving)  Hydration  Slowly increase the amount of water and other clear liquids as tolerated (See Acceptable Fluids)  Slowly increase the amount of protein shake as tolerated  Sip fluids slowly and throughout the day  May use sugar substitutes in small amounts (no more than 6-8 packets per day; i.e. Splenda)  Fluid Goal  The first goal is to drink at least 8 ounces of protein shake/drink per day (or as directed by the nutritionist); some examples of protein shakes are Johnson [employer] Johnson, AMR [employer], EAS Edge HP, and Unjury. - See handout from pre-op Bariatric Education Class: o Slowly increase the amount of protein shake you drink as tolerated o You may find it easier to slowly sip shakes throughout the day o It is important to get your proteins in first  Your fluid goal is to drink 64-100 ounces of fluid daily o It may take a few weeks to build up to this   32 oz. (or more) should be clear liquids And  32 oz. (or more) should be full liquids (see below for examples)  Liquids should not contain sugar, caffeine, or carbonation  Clear Liquids:  Water of Sugar-free flavored water (i.e. Fruit HO, Propel)  Decaffeinated coffee or tea (sugar-free)  Crystal lite, Wylers Lite, Minute Maid Lite  Sugar-free Jell-O  Bouillon or broth  Sugar-free Popsicle:    - Less than 20 calories  each; Limit 1 per day  Full Liquids:                   Protein Shakes/Drinks + 2 choices per day of other full liquids  Full liquids must be: o No More Than 15 grams of Carbs per serving o No More Than 3 grams of Fat per serving  Strained low-fat cream soup  Non-Fat milk  Fat-free Lactaid Milk  Sugar-free yogurt (Dannon Lite & Fit, Greek yogurt)    Vitamins and Minerals  Start 1 day after surgery unless otherwise directed by your surgeon  2 Chewable Multivitamin / Multimineral Supplement with iron (i.e. Centrum for Adults)  Vitamin B-12, 350-500 micrograms sub-lingual (place tablet under the tongue) each day  Chewable Calcium Citrate with Vitamin D-3 (Example: 3 Chewable Calcium  Plus 600 with Vitamin D-3) o Take 500 mg three (3) times a day for  a total of 1500 mg each day o Do not take all 3 doses of calcium at one time as it may cause constipation, and you can only absorb 500 mg at a time o Do not mix multivitamins containing iron with calcium supplements;  take 2 hours apart o Do not substitute Tums (calcium carbonate) for your calcium  Menstruating women and those at risk for anemia ( a blood disease that causes weakness) may need extra iron o Talk to your doctor to see if you need more iron  If you need extra iron: Total daily Iron recommendation (including Vitamins) is 50 to 100 mg Iron/day  Do not stop taking or change any vitamins or minerals until you talk to your nutritionist or surgeon  Your nutritionist and/or surgeon must approve all vitamin and mineral supplements   Activity and Exercise: It is important to continue walking at home. Limit your physical activity as instructed by your doctor. During this time, use these guidelines:  Do not lift anything greater than ten  (10) pounds for at least two (2) weeks  Do not go back to work or drive until Engineer, production says you can  You may have sex when you feel comfortable o It is VERY important for female  patients to use a reliable birth control method; fertility often increase after surgery o Do not get pregnant for at least 18 months  Start exercising as soon as your doctor tells you that you can o Make sure your doctor approves any physical activity  Start with a simple walking program  Walk 5-15 minutes each day, 7 days per week  Slowly increase until you are walking 30-45 minutes per day  Consider joining our [place] program. [phone] or email [email or web address]   Special Instructions Things to remember:  Free counseling is available for you and your family through collaboration between [facility] and [facility]. Please call [phone] and leave a message  Use your CPAP when sleeping if this applies to you  Consider buying a medical alert bracelet that says you had lap-band surgery     You will likely have your first fill (fluid added to your band) 6 - 8 weeks after surgery  [facility] has a free Bariatric Surgery Support Group that meets monthly, the 3rd Thursday, [place]. You can see classes online at [email or web address]  It is very important to keep all follow up appointments with your surgeon, nutritionist, primary care physician, and behavioral health practitioner o After the first year, please follow up with your bariatric surgeon and nutritionist at least once a year in order to maintain best weight loss results                    [facility]:  [place]: [phone]               Bariatric Nurse Coordinator: [phone]  Gastric Bypass/Sleeve Home Care Instructions  Rev. 11/2012                                                         Reviewed and Vilinda Boehringer  by [facility] Patient Education Committee, Jan, 2014 ° ° ° ° ° ° ° ° ° °

## 2016-02-19 NOTE — Progress Notes (Signed)
Pt refuses CPAP 

## 2016-02-19 NOTE — Progress Notes (Signed)
Pt refused cpap again tonight.  Machine not in room.  Pt was advised that RT is available all night and encouraged her to call should she change her mind.  RN aware.

## 2016-02-19 NOTE — Progress Notes (Signed)
Patient alert and oriented, pain is controlled. Patient is tolerating fluids, plan to advanced to protein shake tomorrow.  Reviewed Gastric sleeve discharge instructions with patient and patient is able to articulate understanding.  Provided information on BELT program, Support Group and [facility] outpatient pharmacy. All questions answered, will continue to monitor.

## 2016-02-20 LAB — BASIC METABOLIC PANEL
Anion gap: 11 (ref 5–15)
BUN: 5 mg/dL — ABNORMAL LOW (ref 6–20)
CO2: 25 mmol/L (ref 22–32)
Calcium: 9 mg/dL (ref 8.9–10.3)
Chloride: 104 mmol/L (ref 101–111)
Creatinine, Ser: 0.63 mg/dL (ref 0.44–1.00)
GFR calc Af Amer: >60 mL/min (ref >60)
GFR calc non Af Amer: >60 mL/min (ref >60)
Glucose, Bld: 112 mg/dL — ABNORMAL HIGH (ref 65–99)
Potassium: 3.1 mmol/L — ABNORMAL LOW (ref 3.5–5.1)
Sodium: 140 mmol/L (ref 135–145)

## 2016-02-20 LAB — GLUCOSE, CAPILLARY
Glucose-Capillary: 126 mg/dL — ABNORMAL HIGH (ref 65–99)
Glucose-Capillary: 131 mg/dL — ABNORMAL HIGH (ref 65–99)

## 2016-02-20 LAB — CBC WITH DIFFERENTIAL/PLATELET
Basophils Absolute: 0.1 10*3/uL (ref 0.0–0.1)
Basophils Relative: 1 %
Eosinophils Absolute: 0.1 10*3/uL (ref 0.0–0.7)
Eosinophils Relative: 1 %
HCT: 38.5 % (ref 36.0–46.0)
Hemoglobin: 12.4 g/dL (ref 12.0–15.0)
Lymphocytes Relative: 37 %
Lymphs Abs: 3.2 10*3/uL (ref 0.7–4.0)
MCH: 25.8 pg — ABNORMAL LOW (ref 26.0–34.0)
MCHC: 32.2 g/dL (ref 30.0–36.0)
MCV: 80.2 fL (ref 78.0–100.0)
Monocytes Absolute: 0.9 10*3/uL (ref 0.1–1.0)
Monocytes Relative: 10 %
Neutro Abs: 4.3 10*3/uL (ref 1.7–7.7)
Neutrophils Relative %: 51 %
Platelets: 369 10*3/uL (ref 150–400)
RBC: 4.8 MIL/uL (ref 3.87–5.11)
RDW: 13.2 % (ref 11.5–15.5)
WBC: 8.5 10*3/uL (ref 4.0–10.5)

## 2016-02-20 LAB — MAGNESIUM: Magnesium: 1.8 mg/dL (ref 1.7–2.4)

## 2016-02-20 MED ORDER — GABAPENTIN 300 MG PO CAPS: 300.0000 mg | ORAL_CAPSULE | Freq: Every day | ORAL | Status: DC

## 2016-02-20 MED ORDER — OXYCODONE HCL 5 MG/5ML PO SOLN: 5.0000 mg | ORAL | Status: DC | PRN

## 2016-02-20 MED ORDER — CYCLOBENZAPRINE HCL 10 MG PO TABS: 10.0000 mg | ORAL_TABLET | Freq: Every evening | ORAL | Status: DC | PRN

## 2016-02-20 MED ORDER — ONDANSETRON 4 MG PO TBDP: 4.0000 mg | ORAL_TABLET | Freq: Three times a day (TID) | ORAL | Status: DC | PRN

## 2016-02-20 MED ORDER — SIMETHICONE 40 MG/0.6ML PO SUSP
40.0000 mg | Freq: Four times a day (QID) | ORAL | Status: DC | PRN
  Filled 2016-02-20: qty 0.6

## 2016-02-20 NOTE — Progress Notes (Signed)
Patient alert and oriented with pain controlled. Patient given discharge instructions on bariatric diet and encouraged to increase intake. Patient educated on importance of protein intake via liquids and to maintain adequate hydration status. Patient verbalized understanding of all instructions. All questions and concerns answered.

## 2016-02-20 NOTE — Plan of Care (Signed)
Problem: Food- and Nutrition-Related Knowledge Deficit (NB-1.1) Goal: Nutrition education Formal process to instruct or train a patient/client in a skill or to impart knowledge to help patients/clients voluntarily manage or modify food choices and eating behavior to maintain or improve health. Outcome: Completed/Met Date Met:  02/20/16 Nutrition Education Note  Received consult for diet education per DROP protocol.   Discussed 2 week post op diet with pt. Emphasized that liquids must be non carbonated, non caffeinated, and sugar free. Fluid goals discussed. Reviewed progression of diet to include soft proteins at 7-10 days post-op. Pt to follow up with outpatient bariatric RD for further diet progression after 2 weeks. Multivitamins and minerals also reviewed. Teach back method used, pt expressed understanding, expect good compliance.   Diet: First 2 Weeks  You will see the dietitian about two (2) weeks after your surgery. The dietitian will increase the types of foods you can eat if you are handling liquids well:  If you have severe vomiting or nausea and cannot handle clear liquids lasting longer than 1 day, call your surgeon  Protein Shake  Drink at least 2 ounces of shake 5-6 times per day  Each serving of protein shakes (usually 8 - 12 ounces) should have a minimum of:  15 grams of protein  And no more than 5 grams of carbohydrate  Goal for protein each day:  Men = 80 grams per day  Women = 60 grams per day  Protein powder may be added to fluids such as non-fat milk or Lactaid milk or Soy milk (limit to 35 grams added protein powder per serving)   Hydration  Slowly increase the amount of water and other clear liquids as tolerated (See Acceptable Fluids)  Slowly increase the amount of protein shake as tolerated  Sip fluids slowly and throughout the day  May use sugar substitutes in small amounts (no more than 6 - 8 packets per day; i.e. Splenda)   Fluid Goal  The first goal is to  drink at least 8 ounces of protein shake/drink per day (or as directed by the nutritionist); some examples of protein shakes are Johnson & Johnson, AMR Corporation, EAS Edge HP, and Unjury. See handout from pre-op Bariatric Education Class:  Slowly increase the amount of protein shake you drink as tolerated  You may find it easier to slowly sip shakes throughout the day  It is important to get your proteins in first  Your fluid goal is to drink 64 - 100 ounces of fluid daily  It may take a few weeks to build up to this  32 oz (or more) should be clear liquids  And  32 oz (or more) should be full liquids (see below for examples)  Liquids should not contain sugar, caffeine, or carbonation   Clear Liquids:  Water or Sugar-free flavored water (i.e. Fruit H2O, Propel)  Decaffeinated coffee or tea (sugar-free)  Crystal Lite, Wyler's Lite, Minute Maid Lite  Sugar-free Jell-O  Bouillon or broth  Sugar-free Popsicle: *Less than 20 calories each; Limit 1 per day   Full Liquids:  Protein Shakes/Drinks + 2 choices per day of other full liquids  Full liquids must be:  No More Than 12 grams of Carbs per serving  No More Than 3 grams of Fat per serving  Strained low-fat cream soup  Non-Fat milk  Fat-free Lactaid Milk  Sugar-free yogurt (Dannon Lite & Fit, Greek yogurt)     Clayton Bibles, MS, RD, LDN Pager: [phone] After Hours Pager: [phone]

## 2016-02-20 NOTE — Discharge Summary (Signed)
Physician Discharge Summary  Kaylee Montgomery [id number] DOB: Sep 11, 1981 DOA: 02/18/2016  PCP: [name], MD  Admit date: 02/18/2016 Discharge date: 02/20/2016  Recommendations for Outpatient Follow-up:   Follow-up Information    Follow up with [name], MD. Go on 03/14/2016.   Specialty:  General Surgery   Why:  For Post-Op Check at 11:15 AM   Contact information:   [address] [place] [address] [phone]       Follow up with [name], MD. Go on 04/01/2016.   Specialty:  General Surgery   Why:  For Post-Op Check at 3:30 PM   Contact information:   [place] [address] [phone]      Discharge Diagnoses:  Principal Problem:   Morbid obesity ([place]) Active Problems:   Essential hypertension   Prediabetes   Obstructive sleep apnea   Common migraine with intractable migraine   Fatty liver   S/P laparoscopic sleeve gastrectomy   Surgical Procedure: Laparoscopic Sleeve Gastrectomy, upper endoscopy  Discharge Condition: Good Disposition: Home  Diet recommendation: Postoperative sleeve gastrectomy diet (liquids only)  Filed Weights   02/18/16 0525 02/19/16 1504  Weight: 109.77 kg (242 lb) 110.542 kg (243 lb 11.2 oz)     Hospital Course:  The patient was admitted for a planned laparoscopic sleeve gastrectomy. Please see operative note. Preoperatively the patient was given 5000 units of subcutaneous heparin for DVT prophylaxis. Postoperative prophylactic Lovenox dosing was started on the morning of postoperative day 1. On POD 1 she was afebrile, stable abdominal exam, not tachycardiac.  The patient was started on ice chips and water which they tolerated. On postoperative day 2 The patient's diet was advanced to protein shakes which they also tolerated. The patient was ambulating without difficulty. Their vital signs are stable without fever or tachycardia. Their hemoglobin had remained stable. The patient refused  CPAP therapy. The patient had received discharge instructions and counseling. She took in an appropriate amount of protein shake on POD 2 prior to dc. They were deemed stable for discharge.  BP 115/92 mmHg  Pulse 110  Temp(Src) 98.3 F (36.8 C) (Oral)  Resp 18  Ht 5' 4.5" (1.638 m)  Wt 110.542 kg (243 lb 11.2 oz)  BMI 41.20 kg/m2  SpO2 95%  Gen: alert, NAD, non-toxic appearing; looks comfortable. Did well with water.  Pupils: equal, no scleral icterus Pulm: Lungs clear to auscultation, symmetric chest rise CV: regular rate and rhythm Abd: soft, mild approp tender, nondistended. No cellulitis. No incisional hernia Ext: no edema, no calf tenderness Skin: no rash, no jaundice   Discharge Instructions  Discharge Instructions    Ambulate hourly while awake    Complete by:  As directed      Call MD for:  difficulty breathing, headache or visual disturbances    Complete by:  As directed      Call MD for:  persistant dizziness or light-headedness    Complete by:  As directed      Call MD for:  persistant nausea and vomiting    Complete by:  As directed      Call MD for:  redness, tenderness, or signs of infection (pain, swelling, redness, odor or green/yellow discharge around incision site)    Complete by:  As directed      Call MD for:  severe uncontrolled pain    Complete by:  As directed      Call MD for:  temperature >101 F  Complete by:  As directed      Diet bariatric full liquid    Complete by:  As directed      Discharge instructions    Complete by:  As directed   See bariatric discharge instructions     Incentive spirometry    Complete by:  As directed   Perform hourly while awake            Medication List    STOP taking these medications        ibuprofen 800 MG tablet  Commonly known as:  ADVIL,MOTRIN     triamterene-hydrochlorothiazide 37.5-25 MG tablet  Commonly known as:  MAXZIDE-25      TAKE these medications        cyclobenzaprine 10 MG tablet   Commonly known as:  FLEXERIL  Take 1 tablet (10 mg total) by mouth at bedtime as needed for muscle spasms. One tablet at bedtime, for muscle spasm , for 5 days, then as  needed     fluticasone 50 MCG/ACT nasal spray  Commonly known as:  FLONASE  Place 1 spray into both nostrils daily.     gabapentin 300 MG capsule  Commonly known as:  NEURONTIN  Take 1 capsule (300 mg total) by mouth at bedtime.     metoprolol succinate 25 MG 24 hr tablet  Commonly known as:  TOPROL-XL  Take 1 tablet (25 mg total) by mouth daily.     mupirocin cream 2 %  Commonly known as:  BACTROBAN  Apply 1 application topically 2 (two) times daily.     ondansetron 4 MG disintegrating tablet  Commonly known as:  ZOFRAN ODT  Take 1 tablet (4 mg total) by mouth every 8 (eight) hours as needed for nausea or vomiting.     oxyCODONE 5 MG/5ML solution  Commonly known as:  ROXICODONE  Take 5-10 mLs (5-10 mg total) by mouth every 4 (four) hours as needed for moderate pain or severe pain.     ranitidine 300 MG tablet  Commonly known as:  ZANTAC  Take 1 tablet (300 mg total) by mouth at bedtime.     sodium chloride 0.65 % Soln nasal spray  Commonly known as:  OCEAN  Place 1 spray into both nostrils as needed for congestion.           Follow-up Information    Follow up with [name], MD. Go on 03/14/2016.   Specialty:  General Surgery   Why:  For Post-Op Check at 11:15 AM   Contact information:   [address] [place] [address] [phone]       Follow up with [name], MD. Go on 04/01/2016.   Specialty:  General Surgery   Why:  For Post-Op Check at 3:30 PM   Contact information:   [place] [address] [phone]        The results of significant diagnostics from this hospitalization (including imaging, microbiology, ancillary and laboratory) are listed below for reference.    Significant Diagnostic Studies: No results found.  Labs: Basic  Metabolic Panel:  Recent Labs Lab 02/14/16 1350 02/19/16 0440 02/20/16 0458  NA 139 140 140  K 3.3* 3.1* 3.1*  CL 102 103 104  CO2 26 27 25   GLUCOSE 80 136* 112*  BUN 12 <5* 5*  CREATININE 0.56 0.66 0.63  CALCIUM 9.7 9.4 9.0  MG  --   --  1.8   Liver Function Tests:  Recent Labs Lab 02/14/16 1350 02/19/16 0440  AST 34 53*  ALT 61* 82*  ALKPHOS 38 38  BILITOT 1.0 1.0  PROT 7.8 7.6  ALBUMIN 4.6 4.5    CBC:  Recent Labs Lab 02/14/16 1350 02/18/16 0944 02/19/16 0440 02/19/16 1537 02/20/16 0458  WBC 7.9  --  9.1  --  8.5  NEUTROABS 3.7  --  6.1  --  4.3  HGB 12.8 12.8 12.7 11.9* 12.4  HCT 39.7 39.8 39.1 36.2 38.5  MCV 79.6  --  79.0  --  80.2  PLT 441*  --  437*  --  369    CBG:  Recent Labs Lab 02/19/16 1624 02/19/16 2002 02/19/16 2359 02/20/16 0432 02/20/16 0759  GLUCAP 120* 109* 116* 126* 131*    Principal Problem:   Morbid obesity ([place]) Active Problems:   Essential hypertension   Prediabetes   Obstructive sleep apnea   Common migraine with intractable migraine   Fatty liver   S/P laparoscopic sleeve gastrectomy   Time coordinating discharge: 15 minutes  Signed:  [name], MD [facility] [place] 02/20/2016, 5:29 PM

## 2016-02-26 ENCOUNTER — Ambulatory Visit: Payer: 59

## 2016-02-29 ENCOUNTER — Telehealth (HOSPITAL_COMMUNITY): Payer: Self-pay

## 2016-02-29 NOTE — Telephone Encounter (Signed)

## 2016-03-04 ENCOUNTER — Encounter: Payer: 59 | Attending: General Surgery

## 2016-03-04 DIAGNOSIS — E669 Obesity, unspecified: Secondary | ICD-10-CM | POA: Insufficient documentation

## 2016-03-04 DIAGNOSIS — Z713 Dietary counseling and surveillance: Secondary | ICD-10-CM | POA: Diagnosis not present

## 2016-03-04 DIAGNOSIS — Z6841 Body Mass Index (BMI) 40.0 and over, adult: Secondary | ICD-10-CM | POA: Insufficient documentation

## 2016-03-04 NOTE — Progress Notes (Signed)
  Bariatric Class:  Appt start time: 1530 end time:  1630.  2 Week Post-Operative Nutrition Class  Patient was seen on 03/04/2016 for Post[facility]ative [facility].    Surgery date: 02/18/2016 Surgery type: sleeve gastrectomy Start weight at [facility]: 244 lbs on 06/14/15 Weight today: 230.6 lbs  Weight change: 19.4 lbs  TANITA  BODY COMP RESULTS  01/28/16 03/04/16   BMI (kg/m^2) 42.9 39.6   Fat Mass (lbs) 127.5 114.8   Fat Free Mass (lbs) 122.5 115.8   Total Body Water (lbs) 89.5 84.8    The following the learning objectives were met by the patient during this course:  Identifies Phase 3A (Soft, High Proteins) Dietary Goals and will begin from 2 weeks post-operatively to 2 months post-operatively  Identifies appropriate sources of fluids and proteins   States protein recommendations and appropriate sources post-operatively  Identifies the need for appropriate texture modifications, mastication, and bite sizes when consuming solids  Identifies appropriate multivitamin and calcium sources post-operatively  Describes the need for physical activity post-operatively and will follow MD recommendations  States when to call healthcare provider regarding medication questions or post-operative complications  Handouts given during class include:  Phase 3A: Soft, High Protein Diet Handout  Follow-Up Plan: Patient will follow-up at [facility] in 6 weeks for 2 month post-op nutrition visit for diet advancement per MD.

## 2016-03-31 ENCOUNTER — Telehealth: Payer: Self-pay | Admitting: Family Medicine

## 2016-03-31 NOTE — Telephone Encounter (Signed)
Tele contact to f/u recent surgery, states she became somewhat depressed due to restricted eating but this is improving also now off BP meds, will keep f/u appt here

## 2016-04-17 ENCOUNTER — Encounter: Payer: 59 | Attending: General Surgery | Admitting: Dietician

## 2016-04-17 ENCOUNTER — Encounter: Payer: Self-pay | Admitting: Dietician

## 2016-04-17 DIAGNOSIS — Z713 Dietary counseling and surveillance: Secondary | ICD-10-CM | POA: Diagnosis not present

## 2016-04-17 DIAGNOSIS — E669 Obesity, unspecified: Secondary | ICD-10-CM | POA: Insufficient documentation

## 2016-04-17 DIAGNOSIS — Z6841 Body Mass Index (BMI) 40.0 and over, adult: Secondary | ICD-10-CM | POA: Insufficient documentation

## 2016-04-17 NOTE — Progress Notes (Signed)
  Follow-up visit:  8 Weeks Post-Operative sleeve gastrectomy Surgery  Medical Nutrition Therapy:  Appt start time: 1010 end time:  1110.  Primary concerns today: Post-operative Bariatric Surgery Nutrition Management. Kaylee Montgomery returns having lost a total of 24 pounds. She reports that she felt depressed after surgery during the liquid diet phase. She has not had any vomiting. Kaylee Montgomery just started a new job and routine is off; she has begun skipping breakfast. She reports frustration when others take interest in her food choices.  Samples provided and patient instructed on proper use:  Bariatric Advantage Multivitamin Chew (qty 2 - Dark cherry) Lot#: [id number] Exp: 01/2017  Bariatric Advantage Calcium citrate Chew (qty 2 - coconut) Lot#: [id number] Exp: 04/2016  Bariatric Advantage Multivitamin Chew (qty 2 - Dark cherry) Lot#: [id number] Exp: 01/2017  Bariatric Advantage Calcium citrate Chew (qty 2 - strawberry) Lot#: [id number] Exp: 09/2016  Bariatric Advantage Calcium citrate Chew (qty 2 - chocolate) Lot#: [id number] Exp: 08/2016  Surgery date: 02/18/2016 Surgery type: sleeve gastrectomy Start weight at [facility]: 244 lbs on 06/14/15 Weight today: 220 lbs Weight change: 10.6 lbs Total weight loss: 24 lbs  TANITA  BODY COMP RESULTS  01/28/16 03/04/16 04/17/16   BMI (kg/m^2) 42.9 39.6 37.8   Fat Mass (lbs) 127.5 114.8 106.4   Fat Free Mass (lbs) 122.5 115.8 113.6   Total Body Water (lbs) 89.5 84.8 82.8    Preferred Learning Style:   No preference indicated   Learning Readiness:   Ready  24-hr recall: B (AM): sausage patty OR skips (0-7g) Snk (AM): sometimes cheese (7g) L (1 PM): 1-2 oz chicken with Ranch dressing or BBQ sauce (7-14g) Snk (PM): cheese (7g) D (PM): hamburger steak with pinto beans, sometimes mashed potatoes (10-14g) Snk (PM):   Fluid intake: Lactaid milk, water with lemon or lime or other fruit, orange juice, lemonade (amount unknown)  Estimated total protein intake:  35-50g  Medications: reduced blood pressure medications  Supplementation: does not take Calcium ("too chalky")  Using straws: no Drinking while eating: no Hair loss: did not assess Carbonated beverages: no N/V/D/C: irregular bowel movements, sometimes has soft stools (patient states she had irregular bowel movements prior to surgery) Dumping syndrome: none  Recent physical activity:  Did not assess  Progress Towards Goal(s):  In progress.  Handouts given during visit include:  Phase 3B lean protein + non starchy vegetables   Nutritional Diagnosis:  [place]-3.3 Overweight/obesity related to past poor dietary habits and physical inactivity as evidenced by patient w/ recent sleeve gastrectomy surgery following dietary guidelines for continued weight loss.     Intervention:  Nutrition counseling provided.  Teaching Method Utilized:  Visual Auditory Hands on  Barriers to learning/adherence to lifestyle change: none  Demonstrated degree of understanding via:  Teach Back   Monitoring/Evaluation:  Dietary intake, exercise, and body weight. Follow up in 6 weeks for 3.5 month post-op visit.

## 2016-04-17 NOTE — Patient Instructions (Addendum)
Goals:  Follow Phase 3B: High Protein + Non-Starchy Vegetables  Eat 3-6 small meals/snacks, every 3-5 hrs  Increase lean protein foods to meet 60g goal  Increase fluid intake to 64oz +  Avoid drinking 15 minutes before, during and 30 minutes after eating  Aim for >30 min of physical activity daily  Develop a routine that includes breakfast (glass of milk for breakfast)  Bring your own sauces to work (Skinny Girl, [place])  Avoid starches like potatoes and stick to sugar free fluids  Try Citracal Petites (3x a day, 2 hours apart)  Surgery date: 02/18/2016 Surgery type: sleeve gastrectomy Start weight at [facility]: 244 lbs on 06/14/15 Weight today: 220 lbs Weight change: 10.6 lbs Total weight loss: 24 lbs  TANITA  BODY COMP RESULTS  01/28/16 03/04/16 04/17/16   BMI (kg/m^2) 42.9 39.6 37.8   Fat Mass (lbs) 127.5 114.8 106.4   Fat Free Mass (lbs) 122.5 115.8 113.6   Total Body Water (lbs) 89.5 84.8 82.8

## 2016-05-02 ENCOUNTER — Encounter: Payer: Self-pay | Admitting: Adult Health

## 2016-05-02 ENCOUNTER — Telehealth: Payer: Self-pay | Admitting: Neurology

## 2016-05-02 MED ORDER — PREDNISONE 5 MG PO TABS: ORAL_TABLET | ORAL | Status: DC

## 2016-05-02 NOTE — Telephone Encounter (Signed)
Patient is calling. Patient reports worsening of pain in her head running from the back of her head to neck and shoulders. The patient did send Kaylee Montgomery an e-mail. She is requesting an appointment for today.Please call to advise.

## 2016-05-02 NOTE — Telephone Encounter (Signed)
I called patient, I left a message, I will call back later.

## 2016-05-02 NOTE — Telephone Encounter (Signed)
I called patient. The patient is having some left-sided neck and head discomfort associated with stiffness of the neck that began 2 days ago. The patient works hours in front of the computer, I have indicated she needs to stretch every 30 minutes. We may try prednisone Dosepak for the next 6 days, if she is no better, she is to call our office. The patient is unable to tolerate higher doses of gabapentin secondary to drowsiness. She takes 300 mg at night.

## 2016-05-06 ENCOUNTER — Ambulatory Visit (INDEPENDENT_AMBULATORY_CARE_PROVIDER_SITE_OTHER): Payer: 59 | Admitting: Family Medicine

## 2016-05-06 ENCOUNTER — Encounter: Payer: Self-pay | Admitting: Family Medicine

## 2016-05-06 VITALS — BP 124/82 | HR 80 | Resp 18 | Ht 64.0 in | Wt 219.0 lb

## 2016-05-06 DIAGNOSIS — R7303 Prediabetes: Secondary | ICD-10-CM

## 2016-05-06 DIAGNOSIS — M542 Cervicalgia: Secondary | ICD-10-CM

## 2016-05-06 DIAGNOSIS — G44019 Episodic cluster headache, not intractable: Secondary | ICD-10-CM

## 2016-05-06 DIAGNOSIS — I10 Essential (primary) hypertension: Secondary | ICD-10-CM | POA: Diagnosis not present

## 2016-05-06 DIAGNOSIS — E559 Vitamin D deficiency, unspecified: Secondary | ICD-10-CM

## 2016-05-06 DIAGNOSIS — Z114 Encounter for screening for human immunodeficiency virus [HIV]: Secondary | ICD-10-CM

## 2016-05-06 DIAGNOSIS — Z9884 Bariatric surgery status: Secondary | ICD-10-CM

## 2016-05-06 MED ORDER — OXYCODONE HCL 5 MG/5ML PO SOLN: 5.0000 mg | Freq: Every evening | ORAL | Status: DC | PRN

## 2016-05-06 NOTE — Assessment & Plan Note (Signed)
Patient educated about the importance of limiting  Carbohydrate intake , the need to commit to daily physical activity for a minimum of 30 minutes , and to commit weight loss. The fact that changes in all these areas will reduce or eliminate all together the development of diabetes is stressed.   Diabetic Labs Latest Ref Rng 02/20/2016 02/19/2016 02/14/2016 01/10/2015 10/24/2013  HbA1c <5.7 % - - - 6.2(H) 6.2(H)  Chol 0 - 200 mg/dL - - - - 174  HDL >39 mg/dL - - - - 33(L)  Calc LDL 0 - 99 mg/dL - - - - 117(H)  Triglycerides <150 mg/dL - - - - 118  Creatinine 0.44 - 1.00 mg/dL 0.63 0.66 0.56 0.59 0.88   BP/Weight 05/06/2016 04/17/2016 03/04/2016 02/20/2016 02/19/2016 02/18/2016 [id number]  Systolic BP A999333 - - [id number] - - 98  Diastolic BP 82 - - 92 - - 69  Wt. (Lbs) 219 220 230.6 - 243.7 - 244.6  BMI 37.57 37.74 39.56 - - 41.2 41.35   No flowsheet data found.   Updated lab needed at/ before next visit.

## 2016-05-06 NOTE — Patient Instructions (Addendum)
F/u in 4 month, call if you need me sooner  You are prescribed short course of pain medication for use for neck pain   You are referred to spine surgeon.please take muscle relaxant flexeril at bedtime for next 1 week for neck spasm  Fasting lipid, hBA1C, chem 7 , TSH and Vit D and HIV in 4 month

## 2016-05-06 NOTE — Assessment & Plan Note (Signed)
Excellent weight loss and good recovery to date, states she is now accustomed to new diet although she was initially depressed , intends to commit to more structured exercise program in the near future

## 2016-05-06 NOTE — Assessment & Plan Note (Signed)
Improved. Pt applauded on succesful weight loss through lifestyle change, and encouraged to continue same. Weight loss goal set for the next several months.  

## 2016-05-06 NOTE — Assessment & Plan Note (Addendum)
1 week h/o neck pain and spasm on left side, this has been recurrent and sever for apst 6 months, being evaluated by neurology also , recent c spine reported as normal Limited amount of oxycodone prescribed for bedtime use for severe [pain, she is to commit to muscle relaxant daily for next 1 week Refer to ortho ( spine) for help with management

## 2016-05-06 NOTE — Assessment & Plan Note (Signed)
chroniuc headache and left sided neck and shoulder pain being managed in part by neurology, will get opinion of ortho spine

## 2016-05-06 NOTE — Progress Notes (Signed)
OMUNIQUE OSTRAND     MRN: [id number]      DOB: 1981/03/28   HPI Kaylee Montgomery is here for follow up and re-evaluation of chronic medical conditions, medication management and review of any available recent lab and radiology data.  Preventive health is updated, specifically  Cancer screening and Immunization.   Questions or concerns regarding consultations or procedures which the PT has had in the interim are  Addressed.Had gastric bypass approx 3 months ago and has lost 29 pounds to date, and is coping better with her new diet. Needs to commit to muscle toning and strengthening exercise. Ongoing c/o right neck pain and spasm, started prednisone orally last week prescribed by her neurologist, taking the flexeril inconsistently. Of note c spine films recently done are normal She has c/o of significant left sided neck pain predominantly for last 6 months , no clear underlying significant pathology identified The PT denies any adverse reactions to current medications since the last visit.     ROS Denies recent fever or chills. Denies sinus pressure, nasal congestion, ear pain or sore throat. Denies chest congestion, productive cough or wheezing. Denies chest pains, palpitations and leg swelling Denies abdominal pain, nausea, vomiting,diarrhea or constipation.   Denies dysuria, frequency, hesitancy or incontinence. Denies depression, anxiety or insomnia. Denies skin break down or rash.   PE  BP 124/82 mmHg  Pulse 80  Resp 18  Ht 5\' 4"  (1.626 m)  Wt 219 lb (99.338 kg)  BMI 37.57 kg/m2  SpO2 98%  Patient alert and oriented and in no cardiopulmonary distress.  HEENT: No facial asymmetry, EOMI,   oropharynx pink and moist.  Neck decreased ROM with left neck spasm and trapezius tenderness no JVD, no mass.  Chest: Clear to auscultation bilaterally.  CVS: S1, S2 no murmurs, no S3.Regular rate.  ABD: Soft non tender.   Ext: No edema  MS: Adequate ROM spine, shoulders, hips and  knees.  Skin: Intact, no ulcerations or rash noted.  Psych: Good eye contact, normal affect. Memory intact not anxious or depressed appearing.  CNS: CN 2-12 intact, power,  normal throughout.no focal deficits noted.   Assessment & Plan  Neck pain on left side 1 week h/o neck pain and spasm on left side, this has been recurrent and sever for apst 6 months, being evaluated by neurology also , recent c spine reported as normal Limited amount of oxycodone prescribed for bedtime use for severe [pain, she is to commit to muscle relaxant daily for next 1 week Refer to ortho ( spine) for help with management  S/P laparoscopic sleeve gastrectomy Excellent weight loss and good recovery to date, states she is now accustomed to new diet although she was initially depressed , intends to commit to more structured exercise program in the near future  Bilateral neck pain Chronic , ongoing and at times debilitating refer to spine surgeon  Morbid obesity ([place]) Improved. Pt applauded on succesful weight loss through lifestyle change, and encouraged to continue same. Weight loss goal set for the next several months.   Essential hypertension Controlled, no change in medication DASH diet and commitment to daily physical activity for a minimum of 30 minutes discussed and encouraged, as a part of hypertension management. The importance of attaining a healthy weight is also discussed.  BP/Weight 05/06/2016 04/17/2016 03/04/2016 02/20/2016 02/19/2016 02/18/2016 [id number]  Systolic BP A999333 - - [id number] - - 98  Diastolic BP 82 - - 92 - - 69  Wt. (Lbs) 219 220  230.6 - 243.7 - 244.6  BMI 37.57 37.74 39.56 - - 41.2 41.35        Headache chroniuc headache and left sided neck and shoulder pain being managed in part by neurology, will get opinion of ortho spine   Prediabetes Patient educated about the importance of limiting  Carbohydrate intake , the need to commit to daily physical activity for a minimum of 30  minutes , and to commit weight loss. The fact that changes in all these areas will reduce or eliminate all together the development of diabetes is stressed.   Diabetic Labs Latest Ref Rng 02/20/2016 02/19/2016 02/14/2016 01/10/2015 10/24/2013  HbA1c <5.7 % - - - 6.2(H) 6.2(H)  Chol 0 - 200 mg/dL - - - - 174  HDL >39 mg/dL - - - - 33(L)  Calc LDL 0 - 99 mg/dL - - - - 117(H)  Triglycerides <150 mg/dL - - - - 118  Creatinine 0.44 - 1.00 mg/dL 0.63 0.66 0.56 0.59 0.88   BP/Weight 05/06/2016 04/17/2016 03/04/2016 02/20/2016 02/19/2016 02/18/2016 [id number]  Systolic BP A999333 - - [id number] - - 98  Diastolic BP 82 - - 92 - - 69  Wt. (Lbs) 219 220 230.6 - 243.7 - 244.6  BMI 37.57 37.74 39.56 - - 41.2 41.35   No flowsheet data found.   Updated lab needed at/ before next visit.

## 2016-05-06 NOTE — Assessment & Plan Note (Signed)
Chronic , ongoing and at times debilitating refer to spine surgeon

## 2016-05-06 NOTE — Assessment & Plan Note (Signed)
Controlled, no change in medication DASH diet and commitment to daily physical activity for a minimum of 30 minutes discussed and encouraged, as a part of hypertension management. The importance of attaining a healthy weight is also discussed.  BP/Weight 05/06/2016 04/17/2016 03/04/2016 02/20/2016 02/19/2016 02/18/2016 [id number]  Systolic BP A999333 - - [id number] - - 98  Diastolic BP 82 - - 92 - - 69  Wt. (Lbs) 219 220 230.6 - 243.7 - 244.6  BMI 37.57 37.74 39.56 - - 41.2 41.35

## 2016-05-29 ENCOUNTER — Ambulatory Visit: Payer: 59 | Admitting: Dietician

## 2016-07-01 ENCOUNTER — Telehealth: Payer: Self-pay | Admitting: *Deleted

## 2016-07-01 NOTE — Telephone Encounter (Signed)
Attempted to call pt and let her know FMLA would be filled out after she is seen 07-17-16.  I was not able to LM.

## 2016-07-03 NOTE — Telephone Encounter (Signed)
LMVM on her home # that will see her at 07-17-16 at 1130 appt prior to filling out her FMLA paperwork.

## 2016-07-08 ENCOUNTER — Telehealth: Payer: Self-pay

## 2016-07-09 ENCOUNTER — Ambulatory Visit (INDEPENDENT_AMBULATORY_CARE_PROVIDER_SITE_OTHER): Payer: 59 | Admitting: Family Medicine

## 2016-07-09 ENCOUNTER — Encounter: Payer: Self-pay | Admitting: Family Medicine

## 2016-07-09 VITALS — BP 118/82 | HR 74 | Resp 16 | Ht 64.0 in | Wt 216.0 lb

## 2016-07-09 DIAGNOSIS — J0100 Acute maxillary sinusitis, unspecified: Secondary | ICD-10-CM

## 2016-07-09 DIAGNOSIS — J329 Chronic sinusitis, unspecified: Secondary | ICD-10-CM | POA: Insufficient documentation

## 2016-07-09 DIAGNOSIS — J209 Acute bronchitis, unspecified: Secondary | ICD-10-CM

## 2016-07-09 MED ORDER — FLUCONAZOLE 150 MG PO TABS: 150.0000 mg | ORAL_TABLET | Freq: Once | ORAL | 0 refills | Status: AC

## 2016-07-09 MED ORDER — AZITHROMYCIN 250 MG PO TABS: ORAL_TABLET | ORAL | 0 refills | Status: DC

## 2016-07-09 MED ORDER — BENZONATATE 100 MG PO CAPS: 100.0000 mg | ORAL_CAPSULE | Freq: Two times a day (BID) | ORAL | 0 refills | Status: DC | PRN

## 2016-07-09 MED ORDER — PROMETHAZINE-DM 6.25-15 MG/5ML PO SYRP: ORAL_SOLUTION | ORAL | 0 refills | Status: DC

## 2016-07-09 NOTE — Patient Instructions (Addendum)
F/u in November, call if you need me before  Medications are sent to  your local pharmacy  Thank you  for choosing [facility]. We consider it a privelige to serve you.  Delivering excellent health care in a caring and  compassionate way is our goal.  Partnering with you,  so that together we can achieve this goal is our strategy.

## 2016-07-09 NOTE — Telephone Encounter (Signed)
appt today

## 2016-07-09 NOTE — Progress Notes (Signed)
   Kaylee Montgomery     MRN: [id number]      DOB: 11/06/1980   HPI 5 day  h/o worsening head and chest congestion, associated with fever and chills intermittently. Nasal drainage has thickened , and is yellowish green, aIncreasing fatigue , poor appetitie and sleep disturbed by cough. No improvement with OTC medication.   ROS Denies recent fever or chills. . Denies chest pains, palpitations and leg swelling Denies abdominal pain, nausea, vomiting,diarrhea or constipation.   Denies dysuria, frequency, hesitancy or incontinence. Denies joint pain, swelling and limitation in mobility. Denies headaches, seizures, numbness, or tingling. Denies depression, anxiety or insomnia. Denies skin break down or rash.   PE  BP 118/82   Pulse 74   Resp 16   Ht 5\' 4"  (1.626 m)   Wt 216 lb (98 kg)   SpO2 98%   BMI 37.08 kg/m   Patient alert and oriented and in no cardiopulmonary distress.  HEENT: No facial asymmetry, EOMI,   oropharynx pink and moist.  Neck supple no JVD, no mass.Maxillary sinus tenderness, TM clear bilaterally  Chest: Adequate air entry, scattered crackles, no wheezes  CVS: S1, S2 no murmurs, no S3.Regular rate.  ABD: Soft non tender.   Ext: No edema  MS: Adequate ROM spine, shoulders, hips and knees.  Skin: Intact, no ulcerations or rash noted.  Psych: Good eye contact, normal affect. Memory intact not anxious or depressed appearing.  CNS: CN 2-12 intact, power,  normal throughout.no focal deficits noted.   Assessment & Plan  Sinusitis Acute oinset with worsening symptoms, z pack prescribed  Acute bronchitis Decongestant and antibiotic prescribed  Morbid obesity ([place]) Improved. Pt applauded on succesful weight loss through lifestyle change, and encouraged to continue same. Weight loss goal set for the next several months.

## 2016-07-09 NOTE — Telephone Encounter (Signed)
Needs ov for antibiotioc , she can be seen today or tomorrow

## 2016-07-11 NOTE — Assessment & Plan Note (Signed)
Improved. Pt applauded on succesful weight loss through lifestyle change, and encouraged to continue same. Weight loss goal set for the next several months.  

## 2016-07-11 NOTE — Assessment & Plan Note (Signed)
Decongestant and antibiotic prescribed 

## 2016-07-11 NOTE — Assessment & Plan Note (Signed)
Acute oinset with worsening symptoms, z pack prescribed

## 2016-07-17 ENCOUNTER — Ambulatory Visit: Payer: 59 | Admitting: Adult Health

## 2016-08-13 ENCOUNTER — Ambulatory Visit: Payer: 59 | Admitting: Adult Health

## 2016-08-13 ENCOUNTER — Encounter: Payer: Self-pay | Admitting: Adult Health

## 2016-08-13 ENCOUNTER — Ambulatory Visit (INDEPENDENT_AMBULATORY_CARE_PROVIDER_SITE_OTHER): Payer: 59 | Admitting: Adult Health

## 2016-08-13 VITALS — BP 134/86 | Resp 16 | Ht 64.0 in | Wt 217.0 lb

## 2016-08-13 DIAGNOSIS — G44209 Tension-type headache, unspecified, not intractable: Secondary | ICD-10-CM

## 2016-08-13 DIAGNOSIS — M542 Cervicalgia: Secondary | ICD-10-CM | POA: Diagnosis not present

## 2016-08-13 MED ORDER — CYCLOBENZAPRINE HCL 5 MG PO TABS: 5.0000 mg | ORAL_TABLET | Freq: Every day | ORAL | 5 refills | Status: DC

## 2016-08-13 NOTE — Patient Instructions (Signed)
Try Flexeril 5 mg nightly. Can try taking 5 mg twice a day if headache dose not improve Continue gabapentin and metroprolol If no improvement in headache we will consider botox If your symptoms worsen or you develop new symptoms please let us know.

## 2016-08-13 NOTE — Progress Notes (Signed)
I have read the note, and I agree with the clinical assessment and plan.  Mertis Mosher KEITH   

## 2016-08-13 NOTE — Progress Notes (Signed)
PATIENT: MARLOW BOWE DOB: [birth year]  REASON FOR VISIT: follow up- headache HISTORY FROM: patient  HISTORY OF PRESENT ILLNESS: Mrs. Pyon is a 35 year old female with a history of headaches and occipital neuralgia. She returns today for follow-up. She states that she has a daily headache. Her pain is typically in the back of the neck radiating around to the forehead. She denies photophobia and phonophobia nausea and vomiting. Denies any visual changes. Denies any numbness or tingling in the extremities. No weakness in that she needs. She states in the past she's been on Flexeril she feels that it may have been beneficial. She states that she try increasing gabapentin however it made her too sleepy during the day. She states that she typically has to take 4 Advil to get relief during the day. She returns today for an evaluation.  HISTORY 01/15/16: Ms. Ohle is a 35 year old female with a history of headaches and occipital neuralgia. She returns today for follow-up. She has been taking gabapentin 300 mg at bedtime. She states that she never took the morning dose. She is also on metoprolol. She states that she has not had any more of the sharp shooting pains in the occipital region however she does have tension that occurs in the back of the head as if she's going to get the sharp shooting pains. She states that this is very sporadic. She states occasionally she can go weeks without having this tension and other times she will have it consecutively. She reported that the trigger point injections were beneficial. She reports that she does have some neck and shoulder discomfort. 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 She does try to stretch throughout the day. She denies any new neurological symptoms. She returns today for follow-up.  HISTORY 11/23/15: Ms. Kerschen is a 35 year old right-handed black female with a history of headache. The patient has had a worsening of her headache over the  last 1 week. Her headaches are described as a sharp jabbing pain that comes up from her right shoulder into the right neck and into the back of the head on the right side. The episodes of pain may be very intense, but only last for a few minutes, then dissipate. The patient denies any severe neck stiffness, but she does have some soreness in the right shoulder area and trigger points in this region. She reports no discomfort down the right arm. The patient denies any tingling sensations in the head. She has no photophobia or phonophobia with the headache, she denies any nausea or vomiting. There are no visual disturbances. The patient has had multiple episodes of pain daily, she has gone to the emergency room on the 27th of January 2017, she got a headache cocktail at that time. She has been placed on a prednisone Dosepak after seeing Dr. Moshe Cipro, this has been beneficial for several days, but the headache has come back on within the last 24 hours. The patient comes to this office for an evaluation. She is on Toprol 25 mg tablets, this has been somewhat beneficial up until just recently  REVIEW OF SYSTEMS: Out of a complete 14 system review of symptoms, the patient complains only of the following symptoms, and all other reviewed systems are negative.  Headache, neck pain, neck stiffness  ALLERGIES: Allergies  Allergen Reactions  . Bromocriptine Mesylate     REACTION: nausea  . Depo-Medrol [Methylprednisolone Acetate]     Reports itching and redness at the injection site  .  Imitrex [Sumatriptan] Other (See Comments)    Severe upset stomach, nausea feelings    HOME MEDICATIONS: Outpatient Medications Prior to Visit  Medication Sig Dispense Refill  . fluticasone (FLONASE) 50 MCG/ACT nasal spray Place 1 spray into both nostrils daily.    Marland Kitchen gabapentin (NEURONTIN) 300 MG capsule Take 1 capsule (300 mg total) by mouth at bedtime. 60 capsule 11  . metoprolol succinate (TOPROL-XL) 25 MG 24 hr tablet  Take 1 tablet (25 mg total) by mouth daily. (Patient taking differently: Take 25 mg by mouth at bedtime. ) 30 tablet 11  . sodium chloride (OCEAN) 0.65 % SOLN nasal spray Place 1 spray into both nostrils as needed for congestion.    Marland Kitchen azithromycin (ZITHROMAX) 250 MG tablet Two tablets on day one , then one tablet daily for an additional 4 days (Patient not taking: Reported on 08/13/2016) 6 tablet 0  . benzonatate (TESSALON) 100 MG capsule Take 1 capsule (100 mg total) by mouth 2 (two) times daily as needed for cough. (Patient not taking: Reported on 08/13/2016) 20 capsule 0  . promethazine-dextromethorphan (PROMETHAZINE-DM) 6.25-15 MG/5ML syrup One teaspoon at bedtime, as needed, for excessive cough (Patient not taking: Reported on 08/13/2016) 180 mL 0  . cyclobenzaprine (FLEXERIL) 10 MG tablet Take 1 tablet (10 mg total) by mouth at bedtime as needed for muscle spasms. One tablet at bedtime, for muscle spasm , for 5 days, then as  needed (Patient not taking: Reported on 08/13/2016) 30 tablet 0   No facility-administered medications prior to visit.     PAST MEDICAL HISTORY: Past Medical History:  Diagnosis Date  . ALLERGIC RHINITIS   . Common migraine with intractable migraine 02/23/2015  . Complication of anesthesia    high tolererance to meds, takes alot sometimes with c section, and dental work  . Gynecomastia   . Headache(784.0)   . Hypertension   . Obesity   . Pituitary adenoma ([place])   . Sinusitis   . Sleep apnea    uses cpap occasionally    PAST SURGICAL HISTORY: Past Surgical History:  Procedure Laterality Date  . CAESAREAN     x 1  . corrective surgery from tonsillectomy  age 84  . LAPAROSCOPIC GASTRIC SLEEVE RESECTION N/A 02/18/2016   Procedure: LAPAROSCOPIC GASTRIC SLEEVE RESECTION, UPPER ENDO;  Surgeon: Greer Pickerel, MD;  Location: [facility];  Service: General;  Laterality: N/A;  . TONSILLECTOMY  age 58    FAMILY HISTORY: Family History  Problem Relation Age of Onset  .  Diabetes Maternal Grandfather   . Heart disease Father     stent  . Migraines Father   . Migraines Son     SOCIAL HISTORY: Social History   Social History  . Marital status: Single    Spouse name: N/A  . Number of children: 1  . Years of education: N/A   Occupational History  . STUDENT    . [employer]   Social History Main Topics  . Smoking status: Never Smoker  . Smokeless tobacco: Never Used  . Alcohol use 0.0 oz/week     Comment: occasionally  . Drug use: No  . Sexual activity: Not on file   Other Topics Concern  . Not on file   Social History Narrative   Patient is right handed.   Patient drinks 1-2 cups of caffeine daily.      PHYSICAL EXAM  Vitals:   08/13/16 1409  BP: 134/86  Resp: 16  Weight: 217 lb (  98.4 kg)  Height: 5\' 4"  (1.626 m)   Body mass index is 37.25 kg/m.  Generalized: Well developed, in no acute distress   Neurological examination  Mentation: Alert oriented to time, place, history taking. Follows all commands speech and language fluent Cranial nerve II-XII: Pupils were equal round reactive to light. Extraocular movements were full, visual field were full on confrontational test. Facial sensation and strength were normal. Uvula tongue midline. Head turning and shoulder shrug  were normal and symmetric. Motor: The motor testing reveals 5 over 5 strength of all 4 extremities. Good symmetric motor tone is noted throughout.  Sensory: Sensory testing is intact to soft touch on all 4 extremities. No evidence of extinction is noted.  Coordination: Cerebellar testing reveals good finger-nose-finger and heel-to-shin bilaterally.  Gait and station: Gait is normal. Tandem gait is normal. Romberg is negative. No drift is seen.  Reflexes: Deep tendon reflexes are symmetric and normal bilaterally.   DIAGNOSTIC DATA (LABS, IMAGING, TESTING) - I reviewed patient records, labs, notes, testing and imaging myself where  available.  Lab Results  Component Value Date   WBC 8.5 02/20/2016   HGB 12.4 02/20/2016   HCT 38.5 02/20/2016   MCV 80.2 02/20/2016   PLT 369 02/20/2016      Component Value Date/Time   NA 140 02/20/2016 0458   K 3.1 (L) 02/20/2016 0458   CL 104 02/20/2016 0458   CO2 25 02/20/2016 0458   GLUCOSE 112 (H) 02/20/2016 0458   BUN 5 (L) 02/20/2016 0458   CREATININE 0.63 02/20/2016 0458   CREATININE 0.59 01/10/2015 1617   CALCIUM 9.0 02/20/2016 0458   PROT 7.6 02/19/2016 0440   ALBUMIN 4.5 02/19/2016 0440   AST 53 (H) 02/19/2016 0440   ALT 82 (H) 02/19/2016 0440   ALKPHOS 38 02/19/2016 0440   BILITOT 1.0 02/19/2016 0440   GFRNONAA >60 02/20/2016 0458   GFRNONAA >89 01/10/2015 1617   GFRAA >60 02/20/2016 0458   GFRAA >89 01/10/2015 1617    ASSESSMENT AND PLAN 35 y.o. year old female  has a past medical history of ALLERGIC RHINITIS; Common migraine with intractable migraine (02/23/2015); Complication of anesthesia; Gynecomastia; Headache(784.0); Hypertension; Obesity; Pituitary adenoma ([place]); Sinusitis; and Sleep apnea. here with:  1. Tension type headache  The patient's headaches seems to be consistent with the description of a tension-type headache. She will remain on gabapentin and metoprolol. I will add on Flexeril 5 mg at bedtime. Advised the patient that if this is not beneficial she can increase Flexeril to 1 tablet in the morning and at bedtime. She verbalized understanding. Advised the Flexeril can make her drowsy so she should exercise caution if driving after taking this medication. If this is not beneficial we may need to consider trigger point injections or Botox. Patient is amenable to this plan. She will follow-up in 6 months or sooner if needed.    Ward Givens, MSN, NP-C 08/13/2016, 2:32 PM [facility] [address] [place], [place] [address] [phone]

## 2016-08-18 ENCOUNTER — Telehealth: Payer: Self-pay | Admitting: *Deleted

## 2016-08-18 ENCOUNTER — Encounter: Payer: Self-pay | Admitting: Adult Health

## 2016-08-18 ENCOUNTER — Telehealth: Payer: Self-pay | Admitting: Adult Health

## 2016-08-18 ENCOUNTER — Ambulatory Visit (INDEPENDENT_AMBULATORY_CARE_PROVIDER_SITE_OTHER): Payer: 59 | Admitting: *Deleted

## 2016-08-18 ENCOUNTER — Encounter: Payer: Self-pay | Admitting: *Deleted

## 2016-08-18 VITALS — BP 118/84 | HR 82

## 2016-08-18 DIAGNOSIS — M5481 Occipital neuralgia: Secondary | ICD-10-CM

## 2016-08-18 MED ORDER — KETOROLAC TROMETHAMINE 60 MG/2ML IM SOLN
30.0000 mg | Freq: Once | INTRAMUSCULAR | Status: AC
  Administered 2016-08-18: 30 mg via INTRAMUSCULAR

## 2016-08-18 NOTE — Telephone Encounter (Signed)
I see that the prescription was a no print.  I LMVM for pharmacist at [facility] oupt re: prescription for Flexeril 5mg  po qhs #30 with 5 rf.   I responded to pt email about that, and did not know if you wanted to do anything else.

## 2016-08-18 NOTE — Progress Notes (Signed)
Pt here for Toradol injection.  Under aseptic technique Toradol 30mg  IM given L deltoid at 1506.   Tolerated well.  Bandaid applied.  Bp 118/84 and P-82.  Pain located in R base head, neck area.  Level 9 at times.  Gave note that was in clinic today.  Instructed to go and get flexeril and take tonight.  called in to Opal Sidles at [place].  Instructed pt to call us back as needed.

## 2016-08-18 NOTE — Telephone Encounter (Signed)
Pt is calling in regards to the email she sent earlier today. Pt said she is in a lot of pain and can not get the flexeril. She is wanting to get a "shot" or medication

## 2016-08-19 NOTE — Telephone Encounter (Signed)
Kaylee Montgomery, please call and check on patient today. In the past she has had trigger point injections with Dr. Jannifer Franklin. If she is still having discomfort we can get her setup for injections with Dr. Jannifer Franklin

## 2016-08-19 NOTE — Telephone Encounter (Signed)
Called pt and appt scheduled 11/17 for TPI. Pt agreed to arrive 15 min prior to appt time and voiced appreciation for call.

## 2016-08-19 NOTE — Telephone Encounter (Signed)
I called pt.  She was very appreciative of the call.  She stated she is better to day. Stayed home.  She did take flexeril last night.  She is interested in coming in for trigger point injections with Dr. Jannifer Franklin.  I will forward to [facility] , Dr. Tobey Grim nurse to schedule.

## 2016-08-20 NOTE — Telephone Encounter (Signed)
See other previous note.  

## 2016-08-28 ENCOUNTER — Ambulatory Visit (INDEPENDENT_AMBULATORY_CARE_PROVIDER_SITE_OTHER): Payer: 59 | Admitting: Neurology

## 2016-08-28 ENCOUNTER — Telehealth: Payer: Self-pay | Admitting: Neurology

## 2016-08-28 VITALS — BP 142/90 | HR 74 | Wt 217.0 lb

## 2016-08-28 DIAGNOSIS — G8929 Other chronic pain: Secondary | ICD-10-CM

## 2016-08-28 DIAGNOSIS — R51 Headache: Secondary | ICD-10-CM

## 2016-08-28 DIAGNOSIS — M542 Cervicalgia: Secondary | ICD-10-CM

## 2016-08-28 DIAGNOSIS — R519 Headache, unspecified: Secondary | ICD-10-CM

## 2016-08-28 NOTE — Progress Notes (Signed)
Nerve Block:  Marcaine 0.5% Lot: [id number] Expiration: 05/20/2018 NDC: [id number]  Betamethasone 30mg /27mL Lot: [id number] Expiration: 05/2017 NDC: 0517-0720-01  31mL syringesx4: 1.39mL Marcaine 0.5% and 1.5 mL Betamethasone 30mg /37mL  *Patient signed consent form in office for procedure and ABN form

## 2016-08-28 NOTE — Telephone Encounter (Signed)
Pt stopped by office. Dr. Jannifer Franklin is out today and tomorrow. No sooner appts available on his schedule for next week. Discussed w/ Megan NP - IM toradol vs IV Depacon. However, Dr. Jaynee Eagles had availability to do injections this am. See procedure note. Pt voiced appreciation.

## 2016-08-28 NOTE — Telephone Encounter (Signed)
Patient would like a sooner appointment for injection (11/17 is her sch date). Best call back is [phone]

## 2016-08-30 NOTE — Progress Notes (Signed)
Performed by Dr. Jaynee Eagles M.D. Ingredients placed in the 30-gauge needle was used. All procedures a documented below were medically necessary, reasonable and appropriate based on the patient's history, medical diagnosis and physician opinion. Verbal and written informed consent was obtained from the patient, patient was informed of potential risk of procedure, including bruising, bleeding, hematoma formation, infection, muscle weakness, muscle pain, numbness, transient hypertension, transient hyperglycemia and transient insomnia among others. All areas injected were topically clean with isopropyl rubbing alcohol. Nonsterile nonlatex gloves were worn during the procedure.  1. Greater occipital nerve block [phone]). The greater occipital nerve site was identified at the nuchal line medial to the occipital artery. Medication was injected into the left and right occipital nerve areas and suboccipital areas. Patient's condition is associated with inflammation of the greater occipital nerve and associated multiple groups. Injection was deemed medically necessary, reasonable and appropriate. Injection represents a separate and unique surgical service.  2. Lesser occipital nerve block [phone]). The lesser occipital nerve site was identified approximately 2 cm lateral to the greater occipital nerve. Occasion was injected into the left and right occipital nerve areas. Patient's condition is associated with inflammation of the lesser occipital nerve and associated muscle groups. Injection was deemed medically necessary, reasonable and appropriate. Injection represents a separate and unique surgical service.  3.  20553: bilateral trapezius and bilateral paraspinals and bilateral levator scapulae

## 2016-09-05 ENCOUNTER — Ambulatory Visit: Payer: Self-pay | Admitting: Neurology

## 2016-09-08 ENCOUNTER — Ambulatory Visit: Payer: 59 | Admitting: Family Medicine

## 2016-09-15 ENCOUNTER — Encounter: Payer: Self-pay | Admitting: Family Medicine

## 2016-09-15 ENCOUNTER — Ambulatory Visit (INDEPENDENT_AMBULATORY_CARE_PROVIDER_SITE_OTHER): Payer: 59 | Admitting: Family Medicine

## 2016-09-15 VITALS — BP 122/94 | HR 95 | Resp 16 | Ht 64.0 in | Wt 211.0 lb

## 2016-09-15 DIAGNOSIS — Z0000 Encounter for general adult medical examination without abnormal findings: Secondary | ICD-10-CM

## 2016-09-15 DIAGNOSIS — Z114 Encounter for screening for human immunodeficiency virus [HIV]: Secondary | ICD-10-CM

## 2016-09-15 DIAGNOSIS — R7303 Prediabetes: Secondary | ICD-10-CM

## 2016-09-15 DIAGNOSIS — I10 Essential (primary) hypertension: Secondary | ICD-10-CM

## 2016-09-15 DIAGNOSIS — J3089 Other allergic rhinitis: Secondary | ICD-10-CM

## 2016-09-15 DIAGNOSIS — J302 Other seasonal allergic rhinitis: Secondary | ICD-10-CM

## 2016-09-15 DIAGNOSIS — G43019 Migraine without aura, intractable, without status migrainosus: Secondary | ICD-10-CM

## 2016-09-15 MED ORDER — FLUTICASONE PROPIONATE 50 MCG/ACT NA SUSP: 1.0000 | Freq: Every day | NASAL | 6 refills | Status: DC

## 2016-09-15 NOTE — Patient Instructions (Addendum)
Annual  Physical exam in 4 months, call if you need me before.  Fasting lipid, cmp, TSH, HBA1C,  , HIV in 4 months  Weight loss goal of 15 pounds  Please take BP med, this is elevated  It is important that you exercise regularly at least 30 minutes 5 times a week. If you develop chest pain, have severe difficulty breathing, or feel very tired, stop exercising immediately and seek medical attention  Please work on good  health habits so that your health will improve. 1. Commitment to daily physical activity for 30 to 60  minutes, if you are able to do this.  2. Commitment to wise food choices. Aim for half of your  food intake to be vegetable and fruit, one quarter starchy foods, and one quarter protein. Try to eat on a regular schedule  3 meals per day, snacking between meals should be limited to vegetables or fruits or small portions of nuts. 64 ounces of water per day is generally recommended, unless you have specific health conditions, like heart failure or kidney failure where you will need to limit fluid intake.  3. Commitment to sufficient and a  good quality of physical and mental rest daily, generally between 6 to 8 hours per day.  WITH PERSISTANCE AND PERSEVERANCE, THE IMPOSSIBLE , BECOMES THE NORM!

## 2016-09-16 ENCOUNTER — Ambulatory Visit: Payer: Self-pay | Admitting: Neurology

## 2016-09-28 ENCOUNTER — Encounter: Payer: Self-pay | Admitting: Family Medicine

## 2016-09-28 NOTE — Assessment & Plan Note (Signed)
Improved, following sleeve gastrectomy Patient re-educated about  the importance of commitment to a  minimum of 150 minutes of exercise per week.  The importance of healthy food choices with portion control discussed. Encouraged to start a food diary, count calories and to consider  joining a support group. Sample diet sheets offered. Goals set by the patient for the next several months.   Weight /BMI 09/15/2016 08/28/2016 08/13/2016  WEIGHT 211 lb 217 lb 217 lb  HEIGHT 5\' 4"  - 5\' 4"   BMI 36.22 kg/m2 37.25 kg/m2 37.25 kg/m2

## 2016-09-28 NOTE — Assessment & Plan Note (Addendum)
Patient educated about the importance of limiting  Carbohydrate intake , the need to commit to daily physical activity for a minimum of 30 minutes , and to commit weight loss. The fact that changes in all these areas will reduce or eliminate all together the development of diabetes is stressed.  Updated lab needed at/ before next visit.   Diabetic Labs Latest Ref Rng & Units 02/20/2016 02/19/2016 02/14/2016 01/10/2015 10/24/2013  HbA1c <5.7 % - - - 6.2(H) 6.2(H)  Chol 0 - 200 mg/dL - - - - 174  HDL >39 mg/dL - - - - 33(L)  Calc LDL 0 - 99 mg/dL - - - - 117(H)  Triglycerides <150 mg/dL - - - - 118  Creatinine 0.44 - 1.00 mg/dL 0.63 0.66 0.56 0.59 0.88   BP/Weight 09/15/2016 08/28/2016 08/18/2016 08/13/2016 07/09/2016 05/06/2016 123456  Systolic BP 123XX123 A999333 123456 Q000111Q 123456 A999333 -  Diastolic BP 94 90 84 86 82 82 -  Wt. (Lbs) 211 217 - 217 216 219 220  BMI 36.22 37.25 - 37.25 37.08 37.57 37.74   No flowsheet data found.

## 2016-09-28 NOTE — Assessment & Plan Note (Signed)
Controlled, no change in medication  

## 2016-09-28 NOTE — Progress Notes (Signed)
Kaylee Montgomery     MRN: [id number]      DOB: 1981/01/03   HPI Kaylee Montgomery is here for follow up and re-evaluation of chronic medical conditions, medication management and review of any available recent lab and radiology data.  Preventive health is updated, specifically  Cancer screening and Immunization.   Questions or concerns regarding consultations or procedures which the PT has had in the interim are  addressed. The PT denies any adverse reactions to current medications since the last visit.  There are no new concerns.  There are no specific complaints   ROS Denies recent fever or chills. Denies sinus pressure, nasal congestion, ear pain or sore throat. Denies chest congestion, productive cough or wheezing. Denies chest pains, palpitations and leg swelling Denies abdominal pain, nausea, vomiting,diarrhea or constipation.   Denies dysuria, frequency, hesitancy or incontinence. Denies joint pain, swelling and limitation in mobility. Denies headaches, seizures, numbness, or tingling. Denies depression, anxiety or insomnia. Denies skin break down or rash.   PE  BP (!) 122/94   Pulse 95   Resp 16   Ht 5\' 4"  (1.626 m)   Wt 211 lb (95.7 kg)   SpO2 99%   BMI 36.22 kg/m   Patient alert and oriented and in no cardiopulmonary distress.  HEENT: No facial asymmetry, EOMI,   oropharynx pink and moist.  Neck supple no JVD, no mass.  Chest: Clear to auscultation bilaterally.  CVS: S1, S2 no murmurs, no S3.Regular rate.  ABD: Soft non tender.   Ext: No edema  MS: Adequate ROM spine, shoulders, hips and knees.  Skin: Intact, no ulcerations or rash noted.  Psych: Good eye contact, normal affect. Memory intact not anxious or depressed appearing.  CNS: CN 2-12 intact, power,  normal throughout.no focal deficits noted.   Assessment & Plan Essential hypertension Uncontrolled, needs to resum medication DASH diet and commitment to daily physical activity for a minimum of 30  minutes discussed and encouraged, as a part of hypertension management. The importance of attaining a healthy weight is also discussed.  BP/Weight 09/15/2016 08/28/2016 08/18/2016 08/13/2016 07/09/2016 05/06/2016 123456  Systolic BP 123XX123 A999333 123456 Q000111Q 123456 A999333 -  Diastolic BP 94 90 84 86 82 82 -  Wt. (Lbs) 211 217 - 217 216 219 220  BMI 36.22 37.25 - 37.25 37.08 37.57 37.74       Morbid obesity (HCC) Improved, following sleeve gastrectomy Patient re-educated about  the importance of commitment to a  minimum of 150 minutes of exercise per week.  The importance of healthy food choices with portion control discussed. Encouraged to start a food diary, count calories and to consider  joining a support group. Sample diet sheets offered. Goals set by the patient for the next several months.   Weight /BMI 09/15/2016 08/28/2016 08/13/2016  WEIGHT 211 lb 217 lb 217 lb  HEIGHT 5\' 4"  - 5\' 4"   BMI 36.22 kg/m2 37.25 kg/m2 37.25 kg/m2      Seasonal and perennial allergic rhinitis Controlled, no change in medication   Prediabetes Patient educated about the importance of limiting  Carbohydrate intake , the need to commit to daily physical activity for a minimum of 30 minutes , and to commit weight loss. The fact that changes in all these areas will reduce or eliminate all together the development of diabetes is stressed.  Updated lab needed at/ before next visit.   Diabetic Labs Latest Ref Rng & Units 02/20/2016 02/19/2016 02/14/2016 01/10/2015 10/24/2013  HbA1c <5.7 % - - -  6.2(H) 6.2(H)  Chol 0 - 200 mg/dL - - - - 174  HDL >39 mg/dL - - - - 33(L)  Calc LDL 0 - 99 mg/dL - - - - 117(H)  Triglycerides <150 mg/dL - - - - 118  Creatinine 0.44 - 1.00 mg/dL 0.63 0.66 0.56 0.59 0.88   BP/Weight 09/15/2016 08/28/2016 08/18/2016 08/13/2016 07/09/2016 05/06/2016 123456  Systolic BP 123XX123 A999333 123456 Q000111Q 123456 A999333 -  Diastolic BP 94 90 84 86 82 82 -  Wt. (Lbs) 211 217 - 217 216 219 220  BMI 36.22 37.25 - 37.25  37.08 37.57 37.74   No flowsheet data found.    Common migraine with intractable migraine Controlled, no change in medication

## 2016-09-28 NOTE — Assessment & Plan Note (Signed)
Uncontrolled, needs to resum medication DASH diet and commitment to daily physical activity for a minimum of 30 minutes discussed and encouraged, as a part of hypertension management. The importance of attaining a healthy weight is also discussed.  BP/Weight 09/15/2016 08/28/2016 08/18/2016 08/13/2016 07/09/2016 05/06/2016 123456  Systolic BP 123XX123 A999333 123456 Q000111Q 123456 A999333 -  Diastolic BP 94 90 84 86 82 82 -  Wt. (Lbs) 211 217 - 217 216 219 220  BMI 36.22 37.25 - 37.25 37.08 37.57 37.74

## 2016-10-20 HISTORY — PX: BREAST EXCISIONAL BIOPSY: SUR124

## 2016-11-18 ENCOUNTER — Encounter: Payer: Self-pay | Admitting: Neurology

## 2016-11-18 ENCOUNTER — Ambulatory Visit (INDEPENDENT_AMBULATORY_CARE_PROVIDER_SITE_OTHER): Payer: 59 | Admitting: Neurology

## 2016-11-18 VITALS — BP 135/91 | HR 75 | Ht 64.0 in | Wt 217.5 lb

## 2016-11-18 DIAGNOSIS — G43019 Migraine without aura, intractable, without status migrainosus: Secondary | ICD-10-CM

## 2016-11-18 MED ORDER — METOPROLOL SUCCINATE ER 50 MG PO TB24: 50.0000 mg | ORAL_TABLET | Freq: Every day | ORAL | 3 refills | Status: DC

## 2016-11-18 NOTE — Progress Notes (Signed)
Reason for visit: Headache  Kaylee Montgomery is an 36 y.o. female  History of present illness:  Kaylee Montgomery is a 36 year old right-handed black female with a history of intractable migraine headache. The patient has improved in the headache frequency, she is now having about 12 headache days a month. The patient is on Toprol 25 mg tablets and she takes Flexeril 5 mg at night and some gabapentin 300 mg at night, she cannot take gabapentin during the day secondary to drowsiness. The patient in the past has been on Imitrex and baclofen as well without complete benefit. The patient got some benefit from propranolol. She is trying to get into exercising, she has dizziness if she stoops over and then comes back up again. She has not had any fainting episode. The patient has some neck stiffness associated with the headache. Overall, she feels that she is doing fairly well. She occasionally will have sharp jabbing pains in the back of the head, she has not had any of these types of pain since mid December 2017.  Past Medical History:  Diagnosis Date  . ALLERGIC RHINITIS   . Common migraine with intractable migraine 02/23/2015  . Complication of anesthesia    high tolererance to meds, takes alot sometimes with c section, and dental work  . Gynecomastia   . Headache(784.0)   . Hypertension   . Obesity   . Pituitary adenoma ([place])   . Sinusitis   . Sleep apnea    uses cpap occasionally    Past Surgical History:  Procedure Laterality Date  . CAESAREAN     x 1  . corrective surgery from tonsillectomy  age 45  . LAPAROSCOPIC GASTRIC SLEEVE RESECTION N/A 02/18/2016   Procedure: LAPAROSCOPIC GASTRIC SLEEVE RESECTION, UPPER ENDO;  Surgeon: Greer Pickerel, MD;  Location: [facility];  Service: General;  Laterality: N/A;  . TONSILLECTOMY  age 8    Family History  Problem Relation Age of Onset  . Heart disease Father     stent  . Migraines Father   . Migraines Son   . Diabetes Maternal Grandfather      Social history:  reports that she has never smoked. She has never used smokeless tobacco. She reports that she drinks alcohol. She reports that she does not use drugs.    Allergies  Allergen Reactions  . Bromocriptine Mesylate     REACTION: nausea  . Depo-Medrol [Methylprednisolone Acetate]     Reports itching and redness at the injection site  . Imitrex [Sumatriptan] Other (See Comments)    Severe upset stomach, nausea feelings    Medications:  Prior to Admission medications   Medication Sig Start Date End Date Taking? Authorizing Provider  cyclobenzaprine (FLEXERIL) 5 MG tablet Take 1 tablet (5 mg total) by mouth at bedtime. 08/13/16  Yes Ward Givens, NP  fluticasone (FLONASE) 50 MCG/ACT nasal spray Place 1 spray into both nostrils daily. 09/15/16  Yes [name], MD  gabapentin (NEURONTIN) 300 MG capsule Take 1 capsule (300 mg total) by mouth at bedtime. 02/20/16  Yes Greer Pickerel, MD  metoprolol succinate (TOPROL-XL) 25 MG 24 hr tablet Take 1 tablet (25 mg total) by mouth daily. Patient taking differently: Take 25 mg by mouth at bedtime.  01/15/16  Yes Ward Givens, NP  sodium chloride (OCEAN) 0.65 % SOLN nasal spray Place 1 spray into both nostrils as needed for congestion.   Yes Historical Provider, MD    ROS:  Out of a complete 14  system review of symptoms, the patient complains only of the following symptoms, and all other reviewed systems are negative.  Neck pain  Blood pressure (!) 135/91, pulse 75, height 5\' 4"  (1.626 m), weight 217 lb 8 oz (98.7 kg).  Physical Exam  General: The patient is alert and cooperative at the time of the examination. The patient is markedly obese.  Skin: No significant peripheral edema is noted.   Neurologic Exam  Mental status: The patient is alert and oriented x 3 at the time of the examination. The patient has apparent normal recent and remote memory, with an apparently normal attention span and concentration  ability.   Cranial nerves: Facial symmetry is present. Speech is normal, no aphasia or dysarthria is noted. Extraocular movements are full. Visual fields are full.  Motor: The patient has good strength in all 4 extremities.  Sensory examination: Soft touch sensation is symmetric on the face, arms, and legs.  Coordination: The patient has good finger-nose-finger and heel-to-shin bilaterally.  Gait and station: The patient has a normal gait. Tandem gait is normal. Romberg is negative. No drift is seen.  Reflexes: Deep tendon reflexes are symmetric.   Assessment/Plan:  1. Intractable migraine headache  The patient will continue her current medications, we will go up on the dose of the Toprol taking 50 mg at night. If the neck discomfort continues, we may consider addition of nortriptyline and discontinue the Flexeril. She will follow-up in 5 or 6 months, sooner if needed.  Jill Alexanders MD 11/18/2016 12:47 PM  [facility] [address] [place], [place] [address]  Phone [phone] Fax [phone]

## 2016-12-24 ENCOUNTER — Encounter (HOSPITAL_COMMUNITY): Payer: Self-pay

## 2017-01-13 ENCOUNTER — Ambulatory Visit (INDEPENDENT_AMBULATORY_CARE_PROVIDER_SITE_OTHER): Payer: 59 | Admitting: Family Medicine

## 2017-01-13 ENCOUNTER — Other Ambulatory Visit (HOSPITAL_COMMUNITY)
Admission: RE | Admit: 2017-01-13 | Discharge: 2017-01-13 | Disposition: A | Discharge status: Home / Self Care | Payer: 59 | Source: Ambulatory Visit | Attending: Family Medicine | Admitting: Family Medicine

## 2017-01-13 ENCOUNTER — Encounter: Payer: Self-pay | Admitting: Family Medicine

## 2017-01-13 VITALS — BP 122/90 | HR 86 | Resp 15 | Ht 64.0 in | Wt 220.0 lb

## 2017-01-13 DIAGNOSIS — Z124 Encounter for screening for malignant neoplasm of cervix: Secondary | ICD-10-CM | POA: Diagnosis not present

## 2017-01-13 DIAGNOSIS — R922 Inconclusive mammogram: Secondary | ICD-10-CM

## 2017-01-13 DIAGNOSIS — D352 Benign neoplasm of pituitary gland: Secondary | ICD-10-CM | POA: Diagnosis not present

## 2017-01-13 DIAGNOSIS — Z0000 Encounter for general adult medical examination without abnormal findings: Secondary | ICD-10-CM

## 2017-01-13 DIAGNOSIS — J3089 Other allergic rhinitis: Secondary | ICD-10-CM

## 2017-01-13 DIAGNOSIS — Z01419 Encounter for gynecological examination (general) (routine) without abnormal findings: Secondary | ICD-10-CM | POA: Insufficient documentation

## 2017-01-13 DIAGNOSIS — D353 Benign neoplasm of craniopharyngeal duct: Secondary | ICD-10-CM

## 2017-01-13 DIAGNOSIS — J302 Other seasonal allergic rhinitis: Secondary | ICD-10-CM

## 2017-01-13 DIAGNOSIS — Z1151 Encounter for screening for human papillomavirus (HPV): Secondary | ICD-10-CM | POA: Insufficient documentation

## 2017-01-13 NOTE — Patient Instructions (Addendum)
f/u in 5 months, call if  you need me before  Fasting labs this week please  I will ask Dr Jannifer Franklin about following your aneurysm and send you a message  Start daily saline nasal flushes and OTC loratidine one daily for right sinus pressure and allergy control  Food choice and support group to help with this needed  Continue exercise   You are being referred for mammogram due to abnormal left breast exam, very dense breast tissue, at the Breast center Thank you  for choosing [facility]. We consider it a privelige to serve you.  Delivering excellent health care in a caring and  compassionate way is our goal.  Partnering with you,  so that together we can achieve this goal is our strategy.

## 2017-01-13 NOTE — Progress Notes (Signed)
Kaylee Montgomery     MRN: [id number]      DOB: 04-22-1981  HPI: Patient is in for annual physical exam. c/o right ethmoid pressur x 1 week, denies fever or chills, c/o clear drainage, denies sore throat, ear pain or productive cough Labs are past due , will get them in next week. Weight gain despite exercise , needs to address this. Chronic headache and neck pain, no menses x 10 years states related to pituitary tumor, has not had f/u on this for over 3 years but sees neurology , will ask him to review and follow   PE:  BP 122/90   Pulse 86   Resp 15   Ht 5\' 4"  (1.626 m)   Wt 220 lb (99.8 kg)   SpO2 98%   BMI 37.76 kg/m  Pleasant  female, alert and oriented x 3, in no cardio-pulmonary distress. Afebrile. HEENT No facial trauma or asymetry.  Right ethmoid Sinus tender, otherwise no sinus tenderness.  Extra occullar muscles intact, pupils equally reactive to light. External ears normal, tympanic membranes clear. Oropharynx moist, no exudate. Neck: supple, no adenopathy,JVD or thyromegaly.No bruits.  Chest: Clear to ascultation bilaterally.No crackles or wheezes. Non tender to palpation  Breast: No asymetry,. No tenderness. No nipple discharge or inversion.very dense breast tissue ,  Left more than right, questionable possible nodule at 7 o clock position on left breast No axillary or supraclavicular adenopathy  Cardiovascular system; Heart sounds normal,  S1 and  S2 ,no S3.  No murmur, or thrill. Apical beat not displaced Peripheral pulses normal.  Abdomen: Soft, non tender, no organomegaly or masses. No bruits. Bowel sounds normal. No guarding, tenderness or rebound.  .  GU: External genitalia normal female genitalia , normal female distribution of hair. No lesions. Urethral meatus normal in size, no  Prolapse, no lesions visibly  Present. Bladder non tender. Vagina pink and moist , with no visible lesions , discharge present . Adequate pelvic support no   cystocele or rectocele noted Cervix pink and appears healthy, no lesions or ulcerations noted, no discharge noted from os Uterus normal size, no adnexal masses, no cervical motion or adnexal tenderness.   Musculoskeletal exam: Full ROM of spine, hips , shoulders and knees. No deformity ,swelling or crepitus noted. No muscle wasting or atrophy.   Neurologic: Cranial nerves 2 to 12 intact. Power, tone ,sensation and reflexes normal throughout. No disturbance in gait. No tremor.  Skin: Intact, no ulceration, erythema , scaling or rash noted. Pigmentation normal throughout  Psych; Normal mood and affect. Judgement and concentration normal   Assessment & Plan:  Annual physical exam Annual exam as documented. Counseling done  re healthy lifestyle involving commitment to 150 minutes exercise per week, heart healthy diet, and attaining healthy weight.The importance of adequate sleep also discussed.  Changes in health habits are decided on by the patient with goals and time frames  set for achieving them. Immunization and cancer screening needs are specifically addressed at this visit.   Morbid obesity ([place]) Deteriorated. Patient re-educated about  the importance of commitment to a  minimum of 150 minutes of exercise per week.  The importance of healthy food choices with portion control discussed. Encouraged to start a food diary, count calories and to consider  joining a support group. Sample diet sheets offered. Goals set by the patient for the next several months.   Weight /BMI 01/13/2017 11/18/2016 09/15/2016  WEIGHT 220 lb 217 lb 8 oz 211 lb  HEIGHT 5\' 4"  5\' 4"  5\' 4"   BMI 37.76 kg/m2 37.33 kg/m2 36.22 kg/m2      PITUITARY ADENOMA Review of most recent  MRI in 2013 is normal, so imaging not indicated   Dense breast tissue Palpable very dense left breast tissue at the 7 o'clock position ,  And bilateral gynecomastia. Paternal Aunt had breast cancer in her 69's. Mammogram  screen  Seasonal and perennial allergic rhinitis Uncontrolled with right ethmoid pressure, start daily saline nasal flushes and commit to oral allergy medication

## 2017-01-13 NOTE — Assessment & Plan Note (Signed)
Palpable very dense left breast tissue at the 7 o'clock position ,  And bilateral gynecomastia. Paternal Aunt had breast cancer in her 49's. Mammogram screen

## 2017-01-13 NOTE — Assessment & Plan Note (Signed)
Deteriorated. Patient re-educated about  the importance of commitment to a  minimum of 150 minutes of exercise per week.  The importance of healthy food choices with portion control discussed. Encouraged to start a food diary, count calories and to consider  joining a support group. Sample diet sheets offered. Goals set by the patient for the next several months.   Weight /BMI 01/13/2017 11/18/2016 09/15/2016  WEIGHT 220 lb 217 lb 8 oz 211 lb  HEIGHT 5\' 4"  5\' 4"  5\' 4"   BMI 37.76 kg/m2 37.33 kg/m2 36.22 kg/m2

## 2017-01-13 NOTE — Assessment & Plan Note (Signed)
Review of most recent  MRI in 2013 is normal, so imaging not indicated

## 2017-01-13 NOTE — Assessment & Plan Note (Signed)
Uncontrolled with right ethmoid pressure, start daily saline nasal flushes and commit to oral allergy medication

## 2017-01-13 NOTE — Assessment & Plan Note (Signed)
Annual exam as documented. Counseling done  re healthy lifestyle involving commitment to 150 minutes exercise per week, heart healthy diet, and attaining healthy weight.The importance of adequate sleep also discussed. Changes in health habits are decided on by the patient with goals and time frames  set for achieving them. Immunization and cancer screening needs are specifically addressed at this visit. 

## 2017-01-16 LAB — CYTOLOGY - PAP
Adequacy: ABSENT
Diagnosis: NEGATIVE
HPV: NOT DETECTED

## 2017-01-18 ENCOUNTER — Encounter: Payer: Self-pay | Admitting: Family Medicine

## 2017-01-26 ENCOUNTER — Other Ambulatory Visit: Payer: Self-pay | Admitting: Family Medicine

## 2017-01-26 DIAGNOSIS — R922 Inconclusive mammogram: Secondary | ICD-10-CM

## 2017-02-02 ENCOUNTER — Ambulatory Visit
Admission: RE | Admit: 2017-02-02 | Discharge: 2017-02-02 | Disposition: A | Discharge status: Home / Self Care | Payer: 59 | Source: Ambulatory Visit | Attending: Family Medicine | Admitting: Family Medicine

## 2017-02-02 ENCOUNTER — Other Ambulatory Visit: Payer: Self-pay | Admitting: Family Medicine

## 2017-02-02 DIAGNOSIS — R922 Inconclusive mammogram: Secondary | ICD-10-CM

## 2017-02-02 DIAGNOSIS — N6489 Other specified disorders of breast: Secondary | ICD-10-CM | POA: Diagnosis not present

## 2017-02-02 DIAGNOSIS — R921 Mammographic calcification found on diagnostic imaging of breast: Secondary | ICD-10-CM

## 2017-02-02 DIAGNOSIS — R928 Other abnormal and inconclusive findings on diagnostic imaging of breast: Secondary | ICD-10-CM | POA: Diagnosis not present

## 2017-02-05 ENCOUNTER — Ambulatory Visit
Admission: RE | Admit: 2017-02-05 | Discharge: 2017-02-05 | Disposition: A | Discharge status: Home / Self Care | Payer: 59 | Source: Ambulatory Visit | Attending: Family Medicine | Admitting: Family Medicine

## 2017-02-05 ENCOUNTER — Other Ambulatory Visit: Payer: Self-pay | Admitting: Family Medicine

## 2017-02-05 DIAGNOSIS — R921 Mammographic calcification found on diagnostic imaging of breast: Secondary | ICD-10-CM

## 2017-02-05 DIAGNOSIS — N6092 Unspecified benign mammary dysplasia of left breast: Secondary | ICD-10-CM | POA: Diagnosis not present

## 2017-02-05 DIAGNOSIS — N6012 Diffuse cystic mastopathy of left breast: Secondary | ICD-10-CM | POA: Diagnosis not present

## 2017-02-13 DIAGNOSIS — N6320 Unspecified lump in the left breast, unspecified quadrant: Secondary | ICD-10-CM | POA: Diagnosis not present

## 2017-02-14 ENCOUNTER — Ambulatory Visit: Payer: Self-pay | Admitting: Surgery

## 2017-02-14 DIAGNOSIS — N6321 Unspecified lump in the left breast, upper outer quadrant: Secondary | ICD-10-CM

## 2017-02-14 NOTE — H&P (Signed)
History of Present Illness Kaylee Montgomery. Kaylee Shoun MD; 02/14/2017 10:13 PM) The patient is a 36 year old female who presents with a breast mass. Referred by Dr. [name] for evaluation of left breast mass  This is a 36 yo female who presents after a recent physical examination in which her PCP felt a possible mass in the lateral left breast. She was referred for her initial mammogram and ultrasound. This showed four small groups of tiny microcalcifications in the upper outer quadrant of the left breast spanning an area measuring 2.8 x 2.6 x 1.5 cm. Ultrasound was unremarkable. She underwent stereotactic biopsy x 2 of the left upper outer quadrant. The more posterior biopsy was marked by a coil-shaped marker. 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 The more anterior biopsy was marked by an x-shaped marker and showed a sclerotic papillary lesion with microcalcifications. She is referred now for surgical evaluation.   The patient has a pituitary adenoma that is being monitored. This was first diagnosed when she had unexplained bilateral lactation. This has resolved.  Menarche - 21 First pregnancy - 13 Breastfeed - no Hormones - 10 years LMP age 32 Family history negative for breast cancer  CLINICAL DATA: Dense tissue felt on recent physical examination in the 7 o'clock position of the left breast.  EXAM: 2D DIGITAL DIAGNOSTIC BILATERAL MAMMOGRAM WITH CAD AND ADJUNCT TOMO  ULTRASOUND LEFT BREAST  COMPARISON: None.  ACR Breast Density Category b: There are scattered areas of fibroglandular density.  FINDINGS: Four small groups of tiny microcalcifications in the upper outer quadrant of the left breast, spanning an area measuring 2.8 x 2.6 x 1.5 cm. These are punctate and rounded in configuration with no linear forms or linearly arranged calcifications. There are no similar calcifications elsewhere in either breast and no other mammographic  findings in either breast suspicious for malignancy.  Mammographic images were processed with CAD.  On physical exam, there is thick palpable soft tissue in the 6-8 o'clock position of the left breast, 1-3 cm from the nipple. No discrete palpable mass is present.  Targeted ultrasound is performed, showing normal appearing breast tissue throughout the 6-8 o'clock positions of the left breast in the areas of thick palpable soft tissue.  IMPRESSION: Four small groups of tiny microcalcifications in the upper outer quadrant of the left breast spanning an area measuring 2.8 x 2.6 x 1.5 cm. These are indeterminate for malignancy. No evidence of malignancy elsewhere in either breast.  RECOMMENDATION: Stereotactic guided core needle biopsy of the indeterminate microcalcifications in the upper-outer quadrant of the left breast. This has been discussed with the patient and scheduled at 9:30 a.m. on 02/05/2017.  I have discussed the findings and recommendations with the patient. Results were also provided in writing at the conclusion of the visit. If applicable, a reminder letter will be sent to the patient regarding the next appointment.  BI-RADS CATEGORY 4: Suspicious.   Electronically Signed By: Claudie Revering M.D. On: 02/02/2017 17:00  CLINICAL DATA: 36 year old female presenting for stereotactic biopsy of left breast calcifications.  EXAM: LEFT BREAST STEREOTACTIC CORE NEEDLE BIOPSY  COMPARISON: Previous exams.  FINDINGS: The patient and I discussed the procedure of stereotactic-guided biopsy including benefits and alternatives. We discussed the high likelihood of a successful procedure. We discussed the risks of the procedure including infection, bleeding, tissue injury, clip migration, and inadequate sampling. Informed written consent was given. The usual time out protocol was performed immediately prior to the procedure.  Using sterile technique and  1% Lidocaine as  local anesthetic, under stereotactic guidance, a 9 gauge vacuum assisted device was used to perform core needle biopsy of calcifications in the upper-outer quadrant of the left breast, posterior depth using a superior approach. Specimen radiograph was performed showing the majority of the calcifications within 1 core sample. Specimens with calcifications are identified for pathology.  Lesion quadrant: Upper-outer quadrant, posterior depth  At the conclusion of the procedure, a coil shaped tissue marker clip was deployed into the biopsy cavity.  Using sterile technique and 1% Lidocaine as local anesthetic, under stereotactic guidance, a 9 gauge vacuum assisted device was used to perform core needle biopsy of calcifications in the upper-outer quadrant of the left breast, anterior depth using a superior approach. Specimen radiograph was performed showing the calcifications predominantly in 1 core sample. Specimens with calcifications are identified for pathology.  Lesion quadrant: Upper-outer quadrant, anterior depth  At the conclusion of the procedure, a X shaped tissue marker clip was deployed into the biopsy cavity. Follow-up 2-view mammogram was performed and dictated separately.  IMPRESSION: 1. Stereotactic-guided biopsy of 2 sites of calcifications in the upper-outer quadrant; 1 in the posterior depth and the other in the anterior depth. No apparent complications.  Electronically Signed: By: Ammie Ferrier M.D. On: 02/05/2017 10:46  ADDENDUM: Pathology revealed usual ductal hyperplasia with microcalcifications in the posterior upper outer quadrant of the left breast. The anterior upper outer quadrant of the left breast showed a sclerotic papillary lesion with microcalcifications, fibrocystic change and fibroadenomatoid change. This was found to be concordant by Dr. Ammie Ferrier.  Excision is recommended for the anterior site, with bracketing to include the two  groups of calcifications between the two biopsy sites which have not been biopsied. Pathology results were discussed with the patient by telephone. The patient reported doing well after the biopsies with tenderness at the sites. Post biopsy instructions and care were reviewed and questions were answered.  The patient was encouraged to call [facility] for any additional concerns. Surgical consultation has been arranged with Dr. Donnie Mesa at [facility] on February 13, 2017.  Pathology results reported by Susa Raring RN, BSN on 02/06/2017.   Electronically Signed By: Ammie Ferrier M.D. On: 02/06/2017 13:48   Allergies Malachy Moan, RMA; 02/13/2017 10:10 AM) Bromocriptine Mesylate *ANTIPARKINSON AGENTS* Depo-Medrol (PF) *CORTICOSTEROIDS* Imitrex *MIGRAINE PRODUCTS*  Medication History Malachy Moan, RMA; 02/13/2017 10:11 AM) Metoprolol Succinate ER (50MG  Tablet ER 24HR, Oral) Active. Cyclobenzaprine HCl (5MG  Tablet, Oral) Active. Gabapentin (300MG  Capsule, Oral) Active. Azelastine HCl (0.1% Solution, Nasal) Active. Multiple Vitamin (Oral) Active. Medications Reconciled    Vitals Malachy Moan RMA; 02/13/2017 10:11 AM) 02/13/2017 10:11 AM Weight: 220 lb Height: 64.75in Body Surface Area: 2.05 m Body Mass Index: 36.89 kg/m  Temp.: 97.83F  Pulse: 106 (Regular)  BP: 150/110 (Sitting, Left Arm, Standard)      Physical Exam Rodman Key K. Freyja Govea MD; 02/14/2017 10:16 PM)  The physical exam findings are as follows: Note:WDWN in NAD Eyes: Pupils equal, round; sclera anicteric HENT: Oral mucosa moist; good dentition Neck: No masses palpated, no thyromegaly Lungs: CTA bilaterally; normal respiratory effort Breasts: symmetric; no nipple retraction or discharge; bilateral fibrocystic changes with ropy-feeing breast tissue; no dominant masses; slight bruising lateral left breast; no axillary  lymphadenopathy CV: Regular rate and rhythm; no murmurs; extremities well-perfused with no edema Abd: +bowel sounds, soft, non-tender, no palpable organomegaly; no palpable hernias Skin: Warm, dry; no sign of jaundice Psychiatric - alert and oriented x  4; calm mood and affect    Assessment & Plan Rodman Key K. Elliet Goodnow MD; 02/13/2017 10:56 AM)  MASS OF LEFT BREAST ON MAMMOGRAM (N63.20)  Current Plans Schedule for Surgery - Left radioactive seed localized lumpectomy. The surgical procedure has been discussed with the patient. Potential risks, benefits, alternative treatments, and expected outcomes have been explained. All of the patient's questions at this time have been answered. The likelihood of reaching the patient's treatment goal is good. The patient understand the proposed surgical procedure and wishes to proceed.  Kaylee Montgomery. Georgette Dover, MD, [facility] Surgery  General/ Trauma Surgery  02/14/2017 10:22 PM

## 2017-02-16 ENCOUNTER — Telehealth: Payer: Self-pay

## 2017-02-16 NOTE — Telephone Encounter (Signed)
Patient called regarding elevated BP and wants to resume BP med. Needs nurse visit blood pressure check sometime today or tomorrow in office.

## 2017-02-17 ENCOUNTER — Ambulatory Visit: Payer: 59

## 2017-02-17 VITALS — BP 124/88

## 2017-02-17 DIAGNOSIS — I10 Essential (primary) hypertension: Secondary | ICD-10-CM

## 2017-02-17 DIAGNOSIS — N63 Unspecified lump in unspecified breast: Secondary | ICD-10-CM

## 2017-02-17 HISTORY — DX: Unspecified lump in unspecified breast: N63.0

## 2017-02-17 NOTE — Progress Notes (Signed)
Patient advised that BP was stable by MD and to keep her next appt for follow up

## 2017-02-27 ENCOUNTER — Encounter (HOSPITAL_BASED_OUTPATIENT_CLINIC_OR_DEPARTMENT_OTHER): Payer: Self-pay | Admitting: *Deleted

## 2017-02-27 ENCOUNTER — Other Ambulatory Visit: Payer: Self-pay | Admitting: Surgery

## 2017-02-27 DIAGNOSIS — N6321 Unspecified lump in the left breast, upper outer quadrant: Secondary | ICD-10-CM

## 2017-02-27 NOTE — Pre-Procedure Instructions (Signed)
To come for EKG and to pick up Boost Breeze 8 oz. - to drink Boost by 0745 DOS

## 2017-03-02 ENCOUNTER — Encounter (HOSPITAL_BASED_OUTPATIENT_CLINIC_OR_DEPARTMENT_OTHER)
Admission: RE | Admit: 2017-03-02 | Discharge: 2017-03-02 | Disposition: A | Discharge status: Home / Self Care | Payer: 59 | Source: Ambulatory Visit | Attending: Surgery | Admitting: Surgery

## 2017-03-02 DIAGNOSIS — Z6836 Body mass index (BMI) 36.0-36.9, adult: Secondary | ICD-10-CM | POA: Diagnosis not present

## 2017-03-02 DIAGNOSIS — I10 Essential (primary) hypertension: Secondary | ICD-10-CM | POA: Diagnosis not present

## 2017-03-02 DIAGNOSIS — D352 Benign neoplasm of pituitary gland: Secondary | ICD-10-CM | POA: Diagnosis not present

## 2017-03-02 DIAGNOSIS — D242 Benign neoplasm of left breast: Secondary | ICD-10-CM | POA: Diagnosis not present

## 2017-03-02 DIAGNOSIS — E669 Obesity, unspecified: Secondary | ICD-10-CM | POA: Diagnosis not present

## 2017-03-02 DIAGNOSIS — Z79899 Other long term (current) drug therapy: Secondary | ICD-10-CM | POA: Diagnosis not present

## 2017-03-02 DIAGNOSIS — R51 Headache: Secondary | ICD-10-CM | POA: Diagnosis not present

## 2017-03-02 DIAGNOSIS — N6092 Unspecified benign mammary dysplasia of left breast: Secondary | ICD-10-CM | POA: Diagnosis not present

## 2017-03-02 DIAGNOSIS — G4730 Sleep apnea, unspecified: Secondary | ICD-10-CM | POA: Diagnosis not present

## 2017-03-02 NOTE — Progress Notes (Signed)
Boost drink given with instructions to complete by [facility], pt verbalized understanding.   EKG done.

## 2017-03-04 ENCOUNTER — Other Ambulatory Visit: Payer: Self-pay | Admitting: Surgery

## 2017-03-04 ENCOUNTER — Ambulatory Visit
Admission: RE | Admit: 2017-03-04 | Discharge: 2017-03-04 | Disposition: A | Discharge status: Home / Self Care | Payer: 59 | Source: Ambulatory Visit | Attending: Surgery | Admitting: Surgery

## 2017-03-04 ENCOUNTER — Other Ambulatory Visit: Payer: Self-pay | Admitting: Adult Health

## 2017-03-04 DIAGNOSIS — N6321 Unspecified lump in the left breast, upper outer quadrant: Secondary | ICD-10-CM

## 2017-03-04 DIAGNOSIS — M542 Cervicalgia: Secondary | ICD-10-CM

## 2017-03-04 DIAGNOSIS — R928 Other abnormal and inconclusive findings on diagnostic imaging of breast: Secondary | ICD-10-CM | POA: Diagnosis not present

## 2017-03-04 DIAGNOSIS — R921 Mammographic calcification found on diagnostic imaging of breast: Secondary | ICD-10-CM | POA: Diagnosis not present

## 2017-03-05 ENCOUNTER — Ambulatory Visit (HOSPITAL_BASED_OUTPATIENT_CLINIC_OR_DEPARTMENT_OTHER): Payer: 59 | Admitting: Anesthesiology

## 2017-03-05 ENCOUNTER — Encounter (HOSPITAL_BASED_OUTPATIENT_CLINIC_OR_DEPARTMENT_OTHER)
Admission: RE | Disposition: A | Discharge status: Home / Self Care | Payer: Self-pay | Source: Ambulatory Visit | Attending: Surgery

## 2017-03-05 ENCOUNTER — Ambulatory Visit (HOSPITAL_BASED_OUTPATIENT_CLINIC_OR_DEPARTMENT_OTHER)
Admission: RE | Admit: 2017-03-05 | Discharge: 2017-03-05 | Disposition: A | Discharge status: Home / Self Care | Payer: 59 | Source: Ambulatory Visit | Attending: Surgery | Admitting: Surgery

## 2017-03-05 ENCOUNTER — Ambulatory Visit
Admission: RE | Admit: 2017-03-05 | Discharge: 2017-03-05 | Disposition: A | Discharge status: Home / Self Care | Payer: 59 | Source: Ambulatory Visit | Attending: Surgery | Admitting: Surgery

## 2017-03-05 ENCOUNTER — Encounter (HOSPITAL_BASED_OUTPATIENT_CLINIC_OR_DEPARTMENT_OTHER): Payer: Self-pay | Admitting: Anesthesiology

## 2017-03-05 DIAGNOSIS — G4733 Obstructive sleep apnea (adult) (pediatric): Secondary | ICD-10-CM | POA: Diagnosis not present

## 2017-03-05 DIAGNOSIS — D352 Benign neoplasm of pituitary gland: Secondary | ICD-10-CM | POA: Diagnosis not present

## 2017-03-05 DIAGNOSIS — D242 Benign neoplasm of left breast: Secondary | ICD-10-CM | POA: Diagnosis not present

## 2017-03-05 DIAGNOSIS — N6092 Unspecified benign mammary dysplasia of left breast: Secondary | ICD-10-CM | POA: Insufficient documentation

## 2017-03-05 DIAGNOSIS — Z6836 Body mass index (BMI) 36.0-36.9, adult: Secondary | ICD-10-CM | POA: Insufficient documentation

## 2017-03-05 DIAGNOSIS — E669 Obesity, unspecified: Secondary | ICD-10-CM | POA: Insufficient documentation

## 2017-03-05 DIAGNOSIS — Z79899 Other long term (current) drug therapy: Secondary | ICD-10-CM | POA: Insufficient documentation

## 2017-03-05 DIAGNOSIS — R921 Mammographic calcification found on diagnostic imaging of breast: Secondary | ICD-10-CM | POA: Diagnosis not present

## 2017-03-05 DIAGNOSIS — I10 Essential (primary) hypertension: Secondary | ICD-10-CM | POA: Insufficient documentation

## 2017-03-05 DIAGNOSIS — R51 Headache: Secondary | ICD-10-CM | POA: Insufficient documentation

## 2017-03-05 DIAGNOSIS — N6320 Unspecified lump in the left breast, unspecified quadrant: Secondary | ICD-10-CM | POA: Diagnosis not present

## 2017-03-05 DIAGNOSIS — G4730 Sleep apnea, unspecified: Secondary | ICD-10-CM | POA: Insufficient documentation

## 2017-03-05 DIAGNOSIS — N6012 Diffuse cystic mastopathy of left breast: Secondary | ICD-10-CM | POA: Diagnosis not present

## 2017-03-05 DIAGNOSIS — N6321 Unspecified lump in the left breast, upper outer quadrant: Secondary | ICD-10-CM

## 2017-03-05 HISTORY — DX: Other seasonal allergic rhinitis: J30.2

## 2017-03-05 HISTORY — DX: Unspecified lump in unspecified breast: N63.0

## 2017-03-05 HISTORY — DX: Migraine, unspecified, not intractable, without status migrainosus: G43.909

## 2017-03-05 HISTORY — PX: BREAST LUMPECTOMY WITH RADIOACTIVE SEED LOCALIZATION: SHX6424

## 2017-03-05 HISTORY — DX: Dental restoration status: Z98.811

## 2017-03-05 SURGERY — BREAST LUMPECTOMY WITH RADIOACTIVE SEED LOCALIZATION
Anesthesia: General | Site: Breast | Laterality: Left

## 2017-03-05 MED ORDER — PHENYLEPHRINE HCL 10 MG/ML IJ SOLN
INTRAMUSCULAR | Status: DC | PRN
  Administered 2017-03-05 (×6): 80 ug via INTRAVENOUS

## 2017-03-05 MED ORDER — CEFAZOLIN SODIUM-DEXTROSE 2-4 GM/100ML-% IV SOLN
2.0000 g | INTRAVENOUS | Status: AC
  Administered 2017-03-05: 2 g via INTRAVENOUS

## 2017-03-05 MED ORDER — PHENYLEPHRINE 40 MCG/ML (10ML) SYRINGE FOR IV PUSH (FOR BLOOD PRESSURE SUPPORT)
PREFILLED_SYRINGE | INTRAVENOUS | Status: AC
  Filled 2017-03-05: qty 10

## 2017-03-05 MED ORDER — BUPIVACAINE HCL (PF) 0.25 % IJ SOLN
INTRAMUSCULAR | Status: DC | PRN
  Administered 2017-03-05: 10 mL

## 2017-03-05 MED ORDER — 0.9 % SODIUM CHLORIDE (POUR BTL) OPTIME
TOPICAL | Status: DC | PRN
  Administered 2017-03-05: 1000 mL

## 2017-03-05 MED ORDER — FENTANYL CITRATE (PF) 100 MCG/2ML IJ SOLN
INTRAMUSCULAR | Status: AC
  Filled 2017-03-05: qty 2

## 2017-03-05 MED ORDER — LACTATED RINGERS IV SOLN
INTRAVENOUS | Status: DC
  Administered 2017-03-05 (×2): via INTRAVENOUS

## 2017-03-05 MED ORDER — LIDOCAINE 2% (20 MG/ML) 5 ML SYRINGE
INTRAMUSCULAR | Status: DC | PRN
  Administered 2017-03-05: 100 mg via INTRAVENOUS

## 2017-03-05 MED ORDER — MIDAZOLAM HCL 2 MG/2ML IJ SOLN
1.0000 mg | INTRAMUSCULAR | Status: DC | PRN
  Administered 2017-03-05: 2 mg via INTRAVENOUS

## 2017-03-05 MED ORDER — CEFAZOLIN SODIUM-DEXTROSE 2-4 GM/100ML-% IV SOLN
INTRAVENOUS | Status: AC
  Filled 2017-03-05: qty 100

## 2017-03-05 MED ORDER — GABAPENTIN 300 MG PO CAPS
300.0000 mg | ORAL_CAPSULE | ORAL | Status: AC
  Administered 2017-03-05: 300 mg via ORAL

## 2017-03-05 MED ORDER — PROPOFOL 10 MG/ML IV BOLUS
INTRAVENOUS | Status: DC | PRN
  Administered 2017-03-05: 200 mg via INTRAVENOUS

## 2017-03-05 MED ORDER — CHLORHEXIDINE GLUCONATE CLOTH 2 % EX PADS: 6.0000 | MEDICATED_PAD | Freq: Once | CUTANEOUS | Status: DC

## 2017-03-05 MED ORDER — PROMETHAZINE HCL 25 MG/ML IJ SOLN: 6.2500 mg | INTRAMUSCULAR | Status: DC | PRN

## 2017-03-05 MED ORDER — CELECOXIB 400 MG PO CAPS
400.0000 mg | ORAL_CAPSULE | ORAL | Status: AC
  Administered 2017-03-05: 400 mg via ORAL

## 2017-03-05 MED ORDER — ACETAMINOPHEN 500 MG PO TABS
ORAL_TABLET | ORAL | Status: AC
  Filled 2017-03-05: qty 2

## 2017-03-05 MED ORDER — ONDANSETRON HCL 4 MG/2ML IJ SOLN
INTRAMUSCULAR | Status: DC | PRN
  Administered 2017-03-05: 4 mg via INTRAVENOUS

## 2017-03-05 MED ORDER — FENTANYL CITRATE (PF) 100 MCG/2ML IJ SOLN
25.0000 ug | INTRAMUSCULAR | Status: DC | PRN
  Administered 2017-03-05 (×2): 50 ug via INTRAVENOUS

## 2017-03-05 MED ORDER — SCOPOLAMINE 1 MG/3DAYS TD PT72: 1.0000 | MEDICATED_PATCH | Freq: Once | TRANSDERMAL | Status: DC | PRN

## 2017-03-05 MED ORDER — MIDAZOLAM HCL 2 MG/2ML IJ SOLN
INTRAMUSCULAR | Status: AC
  Filled 2017-03-05: qty 2

## 2017-03-05 MED ORDER — ACETAMINOPHEN 500 MG PO TABS
1000.0000 mg | ORAL_TABLET | ORAL | Status: AC
  Administered 2017-03-05: 1000 mg via ORAL

## 2017-03-05 MED ORDER — FENTANYL CITRATE (PF) 100 MCG/2ML IJ SOLN
50.0000 ug | INTRAMUSCULAR | Status: DC | PRN
  Administered 2017-03-05: 100 ug via INTRAVENOUS
  Administered 2017-03-05: 25 ug via INTRAVENOUS

## 2017-03-05 MED ORDER — DEXAMETHASONE SODIUM PHOSPHATE 10 MG/ML IJ SOLN
INTRAMUSCULAR | Status: AC
Start: 2017-03-05 — End: 2017-03-05
  Filled 2017-03-05: qty 1

## 2017-03-05 MED ORDER — DEXAMETHASONE SODIUM PHOSPHATE 4 MG/ML IJ SOLN
INTRAMUSCULAR | Status: DC | PRN
  Administered 2017-03-05: 10 mg via INTRAVENOUS

## 2017-03-05 MED ORDER — PROPOFOL 10 MG/ML IV BOLUS
INTRAVENOUS | Status: AC
  Filled 2017-03-05: qty 20

## 2017-03-05 MED ORDER — CELECOXIB 200 MG PO CAPS
ORAL_CAPSULE | ORAL | Status: AC
  Filled 2017-03-05: qty 2

## 2017-03-05 MED ORDER — HYDROCODONE-ACETAMINOPHEN 5-325 MG PO TABS: 1.0000 | ORAL_TABLET | Freq: Four times a day (QID) | ORAL | 0 refills | Status: DC | PRN

## 2017-03-05 MED ORDER — LIDOCAINE 2% (20 MG/ML) 5 ML SYRINGE
INTRAMUSCULAR | Status: AC
  Filled 2017-03-05: qty 5

## 2017-03-05 MED ORDER — GABAPENTIN 300 MG PO CAPS
ORAL_CAPSULE | ORAL | Status: AC
  Filled 2017-03-05: qty 1

## 2017-03-05 MED ORDER — ONDANSETRON HCL 4 MG/2ML IJ SOLN
INTRAMUSCULAR | Status: AC
  Filled 2017-03-05: qty 2

## 2017-03-05 MED ORDER — FENTANYL CITRATE (PF) 100 MCG/2ML IJ SOLN
INTRAMUSCULAR | Status: AC
Start: 2017-03-05 — End: 2017-03-05
  Filled 2017-03-05: qty 2

## 2017-03-05 SURGICAL SUPPLY — 57 items
APL SKNCLS STERI-STRIP NONHPOA (GAUZE/BANDAGES/DRESSINGS) ×1
APPLIER CLIP 9.375 MED OPEN (MISCELLANEOUS) ×3
APR CLP MED 9.3 20 MLT OPN (MISCELLANEOUS) ×1
BENZOIN TINCTURE PRP APPL 2/3 (GAUZE/BANDAGES/DRESSINGS) ×3 IMPLANT
BLADE HEX COATED 2.75 (ELECTRODE) ×3 IMPLANT
BLADE SURG 15 STRL LF DISP TIS (BLADE) ×1 IMPLANT
BLADE SURG 15 STRL SS (BLADE) ×3
CANISTER SUC SOCK COL 7IN (MISCELLANEOUS) IMPLANT
CANISTER SUCT 1200ML W/VALVE (MISCELLANEOUS) ×3 IMPLANT
CHLORAPREP W/TINT 26ML (MISCELLANEOUS) ×3 IMPLANT
CLIP APPLIE 9.375 MED OPEN (MISCELLANEOUS) ×1 IMPLANT
CLOSURE WOUND 1/2 X4 (GAUZE/BANDAGES/DRESSINGS) ×1
COVER BACK TABLE 60X90IN (DRAPES) ×3 IMPLANT
COVER MAYO STAND STRL (DRAPES) ×3 IMPLANT
COVER PROBE W GEL 5X96 (DRAPES) ×3 IMPLANT
DECANTER SPIKE VIAL GLASS SM (MISCELLANEOUS) IMPLANT
DEVICE DUBIN W/COMP PLATE 8390 (MISCELLANEOUS) ×3 IMPLANT
DRAPE LAPAROTOMY 100X72 PEDS (DRAPES) ×3 IMPLANT
DRAPE UTILITY XL STRL (DRAPES) ×3 IMPLANT
DRSG TEGADERM 4X4.75 (GAUZE/BANDAGES/DRESSINGS) ×3 IMPLANT
ELECT BLADE 6.5 .24CM SHAFT (ELECTRODE) ×2 IMPLANT
ELECT REM PT RETURN 9FT ADLT (ELECTROSURGICAL) ×3
ELECTRODE REM PT RTRN 9FT ADLT (ELECTROSURGICAL) ×1 IMPLANT
GAUZE SPONGE 4X4 12PLY STRL LF (GAUZE/BANDAGES/DRESSINGS) IMPLANT
GLOVE BIO SURGEON STRL SZ7 (GLOVE) ×3 IMPLANT
GLOVE BIOGEL PI IND STRL 6.5 (GLOVE) IMPLANT
GLOVE BIOGEL PI IND STRL 7.5 (GLOVE) ×1 IMPLANT
GLOVE BIOGEL PI INDICATOR 6.5 (GLOVE) ×2
GLOVE BIOGEL PI INDICATOR 7.5 (GLOVE) ×2
GLOVE SURG SS PI 6.5 STRL IVOR (GLOVE) ×2 IMPLANT
GOWN STRL REUS W/ TWL LRG LVL3 (GOWN DISPOSABLE) ×2 IMPLANT
GOWN STRL REUS W/TWL LRG LVL3 (GOWN DISPOSABLE) ×6
ILLUMINATOR WAVEGUIDE N/F (MISCELLANEOUS) IMPLANT
KIT MARKER MARGIN INK (KITS) ×3 IMPLANT
LIGHT WAVEGUIDE WIDE FLAT (MISCELLANEOUS) ×2 IMPLANT
NDL HYPO 25X1 1.5 SAFETY (NEEDLE) ×1 IMPLANT
NEEDLE HYPO 25X1 1.5 SAFETY (NEEDLE) ×3 IMPLANT
NS IRRIG 1000ML POUR BTL (IV SOLUTION) ×3 IMPLANT
PACK BASIN DAY SURGERY FS (CUSTOM PROCEDURE TRAY) ×3 IMPLANT
PENCIL BUTTON HOLSTER BLD 10FT (ELECTRODE) ×3 IMPLANT
SLEEVE SCD COMPRESS KNEE MED (MISCELLANEOUS) ×3 IMPLANT
SPONGE GAUZE 2X2 8PLY STER LF (GAUZE/BANDAGES/DRESSINGS) ×1
SPONGE GAUZE 2X2 8PLY STRL LF (GAUZE/BANDAGES/DRESSINGS) ×1 IMPLANT
SPONGE LAP 18X18 X RAY DECT (DISPOSABLE) IMPLANT
SPONGE LAP 4X18 X RAY DECT (DISPOSABLE) ×3 IMPLANT
STRIP CLOSURE SKIN 1/2X4 (GAUZE/BANDAGES/DRESSINGS) ×2 IMPLANT
SUT MON AB 4-0 PC3 18 (SUTURE) ×3 IMPLANT
SUT SILK 2 0 SH (SUTURE) IMPLANT
SUT VIC AB 3-0 SH 27 (SUTURE) ×3
SUT VIC AB 3-0 SH 27X BRD (SUTURE) ×1 IMPLANT
SYR BULB 3OZ (MISCELLANEOUS) IMPLANT
SYR CONTROL 10ML LL (SYRINGE) ×3 IMPLANT
TOWEL OR 17X24 6PK STRL BLUE (TOWEL DISPOSABLE) ×3 IMPLANT
TOWEL OR NON WOVEN STRL DISP B (DISPOSABLE) ×3 IMPLANT
TUBE CONNECTING 20'X1/4 (TUBING) ×1
TUBE CONNECTING 20X1/4 (TUBING) ×2 IMPLANT
YANKAUER SUCT BULB TIP NO VENT (SUCTIONS) ×3 IMPLANT

## 2017-03-05 NOTE — Transfer of Care (Signed)
Immediate Anesthesia Transfer of Care Note  Patient: Kaylee Montgomery  Procedure(s) Performed: Procedure(s): LEFT BREAST LUMPECTOMY WITH RADIOACTIVE SEED LOCALIZATION (Left)  Patient Location: PACU  Anesthesia Type:General  Level of Consciousness: awake  Airway & Oxygen Therapy: Patient Spontanous Breathing and Patient connected to face mask oxygen  Post-op Assessment: Report given to RN and Post -op Vital signs reviewed and stable  Post vital signs: Reviewed and stable  Last Vitals:  Vitals:   03/05/17 1015  BP: (!) 134/94  Pulse: 80  Resp: 18  Temp: 37.1 C    Last Pain:  Vitals:   03/05/17 1015  TempSrc: Oral  PainSc:          Complications: No apparent anesthesia complications

## 2017-03-05 NOTE — H&P (View-Only) (Signed)
History of Present Illness Kaylee Montgomery. Kaylee Facchini MD; 02/14/2017 10:13 PM) The patient is a 36 year old female who presents with a breast mass. Referred by Dr. [name] for evaluation of left breast mass  This is a 36 yo female who presents after a recent physical examination in which her PCP felt a possible mass in the lateral left breast. She was referred for her initial mammogram and ultrasound. This showed four small groups of tiny microcalcifications in the upper outer quadrant of the left breast spanning an area measuring 2.8 x 2.6 x 1.5 cm. Ultrasound was unremarkable. She underwent stereotactic biopsy x 2 of the left upper outer quadrant. The more posterior biopsy was marked by a coil-shaped marker. 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 The more anterior biopsy was marked by an x-shaped marker and showed a sclerotic papillary lesion with microcalcifications. She is referred now for surgical evaluation.   The patient has a pituitary adenoma that is being monitored. This was first diagnosed when she had unexplained bilateral lactation. This has resolved.  Menarche - 22 First pregnancy - 13 Breastfeed - no Hormones - 10 years LMP age 5 Family history negative for breast cancer  CLINICAL DATA: Dense tissue felt on recent physical examination in the 7 o'clock position of the left breast.  EXAM: 2D DIGITAL DIAGNOSTIC BILATERAL MAMMOGRAM WITH CAD AND ADJUNCT TOMO  ULTRASOUND LEFT BREAST  COMPARISON: None.  ACR Breast Density Category b: There are scattered areas of fibroglandular density.  FINDINGS: Four small groups of tiny microcalcifications in the upper outer quadrant of the left breast, spanning an area measuring 2.8 x 2.6 x 1.5 cm. These are punctate and rounded in configuration with no linear forms or linearly arranged calcifications. There are no similar calcifications elsewhere in either breast and no other mammographic  findings in either breast suspicious for malignancy.  Mammographic images were processed with CAD.  On physical exam, there is thick palpable soft tissue in the 6-8 o'clock position of the left breast, 1-3 cm from the nipple. No discrete palpable mass is present.  Targeted ultrasound is performed, showing normal appearing breast tissue throughout the 6-8 o'clock positions of the left breast in the areas of thick palpable soft tissue.  IMPRESSION: Four small groups of tiny microcalcifications in the upper outer quadrant of the left breast spanning an area measuring 2.8 x 2.6 x 1.5 cm. These are indeterminate for malignancy. No evidence of malignancy elsewhere in either breast.  RECOMMENDATION: Stereotactic guided core needle biopsy of the indeterminate microcalcifications in the upper-outer quadrant of the left breast. This has been discussed with the patient and scheduled at 9:30 a.m. on 02/05/2017.  I have discussed the findings and recommendations with the patient. Results were also provided in writing at the conclusion of the visit. If applicable, a reminder letter will be sent to the patient regarding the next appointment.  BI-RADS CATEGORY 4: Suspicious.   Electronically Signed By: Claudie Revering M.D. On: 02/02/2017 17:00  CLINICAL DATA: 36 year old female presenting for stereotactic biopsy of left breast calcifications.  EXAM: LEFT BREAST STEREOTACTIC CORE NEEDLE BIOPSY  COMPARISON: Previous exams.  FINDINGS: The patient and I discussed the procedure of stereotactic-guided biopsy including benefits and alternatives. We discussed the high likelihood of a successful procedure. We discussed the risks of the procedure including infection, bleeding, tissue injury, clip migration, and inadequate sampling. Informed written consent was given. The usual time out protocol was performed immediately prior to the procedure.  Using sterile technique and  1% Lidocaine as  local anesthetic, under stereotactic guidance, a 9 gauge vacuum assisted device was used to perform core needle biopsy of calcifications in the upper-outer quadrant of the left breast, posterior depth using a superior approach. Specimen radiograph was performed showing the majority of the calcifications within 1 core sample. Specimens with calcifications are identified for pathology.  Lesion quadrant: Upper-outer quadrant, posterior depth  At the conclusion of the procedure, a coil shaped tissue marker clip was deployed into the biopsy cavity.  Using sterile technique and 1% Lidocaine as local anesthetic, under stereotactic guidance, a 9 gauge vacuum assisted device was used to perform core needle biopsy of calcifications in the upper-outer quadrant of the left breast, anterior depth using a superior approach. Specimen radiograph was performed showing the calcifications predominantly in 1 core sample. Specimens with calcifications are identified for pathology.  Lesion quadrant: Upper-outer quadrant, anterior depth  At the conclusion of the procedure, a X shaped tissue marker clip was deployed into the biopsy cavity. Follow-up 2-view mammogram was performed and dictated separately.  IMPRESSION: 1. Stereotactic-guided biopsy of 2 sites of calcifications in the upper-outer quadrant; 1 in the posterior depth and the other in the anterior depth. No apparent complications.  Electronically Signed: By: Ammie Ferrier M.D. On: 02/05/2017 10:46  ADDENDUM: Pathology revealed usual ductal hyperplasia with microcalcifications in the posterior upper outer quadrant of the left breast. The anterior upper outer quadrant of the left breast showed a sclerotic papillary lesion with microcalcifications, fibrocystic change and fibroadenomatoid change. This was found to be concordant by Dr. Ammie Ferrier.  Excision is recommended for the anterior site, with bracketing to include the two  groups of calcifications between the two biopsy sites which have not been biopsied. Pathology results were discussed with the patient by telephone. The patient reported doing well after the biopsies with tenderness at the sites. Post biopsy instructions and care were reviewed and questions were answered.  The patient was encouraged to call [facility] for any additional concerns. Surgical consultation has been arranged with Dr. Donnie Mesa at [facility] on February 13, 2017.  Pathology results reported by Susa Raring RN, BSN on 02/06/2017.   Electronically Signed By: Ammie Ferrier M.D. On: 02/06/2017 13:48   Allergies Malachy Moan, RMA; 02/13/2017 10:10 AM) Bromocriptine Mesylate *ANTIPARKINSON AGENTS* Depo-Medrol (PF) *CORTICOSTEROIDS* Imitrex *MIGRAINE PRODUCTS*  Medication History Malachy Moan, RMA; 02/13/2017 10:11 AM) Metoprolol Succinate ER (50MG  Tablet ER 24HR, Oral) Active. Cyclobenzaprine HCl (5MG  Tablet, Oral) Active. Gabapentin (300MG  Capsule, Oral) Active. Azelastine HCl (0.1% Solution, Nasal) Active. Multiple Vitamin (Oral) Active. Medications Reconciled    Vitals Malachy Moan RMA; 02/13/2017 10:11 AM) 02/13/2017 10:11 AM Weight: 220 lb Height: 64.75in Body Surface Area: 2.05 m Body Mass Index: 36.89 kg/m  Temp.: 97.2F  Pulse: 106 (Regular)  BP: 150/110 (Sitting, Left Arm, Standard)      Physical Exam Rodman Key K. Pattie Flaharty MD; 02/14/2017 10:16 PM)  The physical exam findings are as follows: Note:WDWN in NAD Eyes: Pupils equal, round; sclera anicteric HENT: Oral mucosa moist; good dentition Neck: No masses palpated, no thyromegaly Lungs: CTA bilaterally; normal respiratory effort Breasts: symmetric; no nipple retraction or discharge; bilateral fibrocystic changes with ropy-feeing breast tissue; no dominant masses; slight bruising lateral left breast; no axillary  lymphadenopathy CV: Regular rate and rhythm; no murmurs; extremities well-perfused with no edema Abd: +bowel sounds, soft, non-tender, no palpable organomegaly; no palpable hernias Skin: Warm, dry; no sign of jaundice Psychiatric - alert and oriented x  4; calm mood and affect    Assessment & Plan Rodman Key K. Wyman Meschke MD; 02/13/2017 10:56 AM)  MASS OF LEFT BREAST ON MAMMOGRAM (N63.20)  Current Plans Schedule for Surgery - Left radioactive seed localized lumpectomy. The surgical procedure has been discussed with the patient. Potential risks, benefits, alternative treatments, and expected outcomes have been explained. All of the patient's questions at this time have been answered. The likelihood of reaching the patient's treatment goal is good. The patient understand the proposed surgical procedure and wishes to proceed.  Kaylee Montgomery. Georgette Dover, MD, [facility]  General/ Trauma Surgery  02/14/2017 10:22 PM

## 2017-03-05 NOTE — Anesthesia Preprocedure Evaluation (Signed)
Anesthesia Evaluation  Patient identified by MRN, date of birth, ID band Patient awake    Reviewed: Allergy & Precautions, NPO status , Patient's Chart, lab work & pertinent test results, reviewed documented beta blocker date and time   Airway Mallampati: II  TM Distance: >3 FB Neck ROM: Full    Dental  (+) Teeth Intact, Dental Advisory Given, Caps   Pulmonary sleep apnea ,    Pulmonary exam normal breath sounds clear to auscultation       Cardiovascular hypertension, Pt. on home beta blockers Normal cardiovascular exam Rhythm:Regular Rate:Normal     Neuro/Psych  Headaches, Pituitary adenoma negative psych ROS   GI/Hepatic negative GI ROS, Neg liver ROS,   Endo/Other  Obesity   Renal/GU negative Renal ROS     Musculoskeletal negative musculoskeletal ROS (+)   Abdominal   Peds  Hematology negative hematology ROS (+)   Anesthesia Other Findings Day of surgery medications reviewed with the patient.  Left breast mass  Reproductive/Obstetrics                             Anesthesia Physical Anesthesia Plan  ASA: II  Anesthesia Plan: General   Post-op Pain Management:    Induction: Intravenous  Airway Management Planned: LMA  Additional Equipment:   Intra-op Plan:   Post-operative Plan: Extubation in OR  Informed Consent: I have reviewed the patients History and Physical, chart, labs and discussed the procedure including the risks, benefits and alternatives for the proposed anesthesia with the patient or authorized representative who has indicated his/her understanding and acceptance.   Dental advisory given  Plan Discussed with: CRNA  Anesthesia Plan Comments: (Risks/benefits of general anesthesia discussed with patient including risk of damage to teeth, lips, gum, and tongue, nausea/vomiting, allergic reactions to medications, and the possibility of heart attack, stroke and  death.  All patient questions answered.  Patient wishes to proceed.)        Anesthesia Quick Evaluation

## 2017-03-05 NOTE — Anesthesia Procedure Notes (Signed)
Procedure Name: LMA Insertion Date/Time: 03/05/2017 11:51 AM Performed by: Toula Moos L Pre-anesthesia Checklist: Patient identified, Emergency Drugs available, Suction available and Patient being monitored Patient Re-evaluated:Patient Re-evaluated prior to inductionOxygen Delivery Method: Circle system utilized Preoxygenation: Pre-oxygenation with 100% oxygen Intubation Type: IV induction Ventilation: Mask ventilation without difficulty LMA: LMA inserted LMA Size: 4.0 Number of attempts: 1 Airway Equipment and Method: Bite block Placement Confirmation: positive ETCO2 and breath sounds checked- equal and bilateral Tube secured with: Tape Dental Injury: Teeth and Oropharynx as per pre-operative assessment

## 2017-03-05 NOTE — Interval H&P Note (Signed)
History and Physical Interval Note:  03/05/2017 11:27 AM  Kaylee Montgomery  has presented today for surgery, with the diagnosis of Left breast mass  The various methods of treatment have been discussed with the patient and family. After consideration of risks, benefits and other options for treatment, the patient has consented to  Procedure(s): LEFT BREAST LUMPECTOMY WITH RADIOACTIVE SEED LOCALIZATION (Left) as a surgical intervention .  The patient's history has been reviewed, patient examined, no change in status, stable for surgery.  I have reviewed the patient's chart and labs.  Questions were answered to the patient's satisfaction.     Faten Frieson K.

## 2017-03-05 NOTE — Op Note (Signed)
Pre-op Diagnosis: Sclerotic papillary lesion with microcalcifications - left breast Post-op Diagnosis: same Procedure:  Left radioactive seed localized lumpectomy Surgeon:  , K. Anesthesia:  GEN - LMA Indications:  This is a 35 yo female who presents after a recent physical examination in which her PCP felt a possible mass in the lateral left breast. She was referred for her initial mammogram and ultrasound. This showed four small groups of tiny microcalcifications in the upper outer quadrant of the left breast spanning an area measuring 2.8 x 2.6 x 1.5 cm. Ultrasound was unremarkable. She underwent stereotactic biopsy x 2 of the left upper outer quadrant. The more posterior biopsy was marked by a coil-shaped marker. 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 The more anterior biopsy was marked by an x-shaped marker and showed a sclerotic papillary lesion with microcalcifications.  This anterior area is bracketed with two clips.  Description of procedure: The patient is brought to the operating room placed in supine position on the operating room table. After an adequate level of general anesthesia was obtained, her left breast was prepped with ChloraPrep and draped sterile fashion. A timeout was taken to ensure the proper patient and proper procedure. We interrogated the breast with the neoprobe. We made an inframammary crease incision in the lateral part of the breast after infiltrating with 0.25% Marcaine. Dissection was carried down in the breast tissue with cautery. We used the neoprobe to guide us towards the radioactive seed. We excised an area of tissue around the radioactive seed about 3 x 2 x 4 cm in size. The specimen was removed and was oriented with a paint kit. Specimen mammogram showed the two radioactive seeds as well as the biopsy clip within the specimen. This was sent for pathologic examination. There is no residual radioactivity within the  biopsy cavity. We inspected carefully for hemostasis. The wound was thoroughly irrigated. The wound was closed with a deep layer of 3-0 Vicryl and a subcuticular layer of 4-0 Monocryl. Benzoin Steri-Strips were applied. The patient was then extubated and brought to the recovery room in stable condition. All sponge, instrument, and needle counts are correct.   K. , MD, FACS [facility]  General/ Trauma Surgery  03/05/2017 1:06 PM    

## 2017-03-05 NOTE — Discharge Instructions (Signed)
[facility] °Office Phone Number [phone] ° °BREAST BIOPSY/ PARTIAL MASTECTOMY: POST OP INSTRUCTIONS ° °Always review your discharge instruction sheet given to you by the facility where your surgery was performed. ° °IF YOU HAVE DISABILITY OR FAMILY LEAVE FORMS, YOU MUST BRING THEM TO THE OFFICE FOR PROCESSING.  DO NOT GIVE THEM TO YOUR DOCTOR. ° °1. A prescription for pain medication may be given to you upon discharge.  Take your pain medication as prescribed, if needed.  If narcotic pain medicine is not needed, then you may take acetaminophen (Tylenol) or ibuprofen (Advil) as needed. °2. Take your usually prescribed medications unless otherwise directed °3. If you need a refill on your pain medication, please contact your pharmacy.  They will contact our office to request authorization.  Prescriptions will not be filled after 5pm or on week-ends. °4. You should eat very light the first 24 hours after surgery, such as soup, crackers, pudding, etc.  Resume your normal diet the day after surgery. °5. Most patients will experience some swelling and bruising in the breast.  Ice packs and a good support bra will help.  Swelling and bruising can take several days to resolve.  °6. It is common to experience some constipation if taking pain medication after surgery.  Increasing fluid intake and taking a stool softener will usually help or prevent this problem from occurring.  A mild laxative (Milk of Magnesia or Miralax) should be taken according to package directions if there are no bowel movements after 48 hours. °7. Unless discharge instructions indicate otherwise, you may remove your bandages 24-48 hours after surgery, and you may shower at that time.  You may have steri-strips (small skin tapes) in place directly over the incision.  These strips should be left on the skin for 7-10 days.  If your surgeon used skin glue on the incision, you may shower in 24 hours.  The glue will flake off over the  next 2-3 weeks.  Any sutures or staples will be removed at the office during your follow-up visit. °8. ACTIVITIES:  You may resume regular daily activities (gradually increasing) beginning the next day.  Wearing a good support bra or sports bra minimizes pain and swelling.  You may have sexual intercourse when it is comfortable. °a. You may drive when you no longer are taking prescription pain medication, you can comfortably wear a seatbelt, and you can safely maneuver your car and apply brakes. °b. RETURN TO WORK:  ______________________________________________________________________________________ °9. You should see your doctor in the office for a follow-up appointment approximately two weeks after your surgery.  Your doctor’s nurse will typically make your follow-up appointment when she calls you with your pathology report.  Expect your pathology report 2-3 business days after your surgery.  You may call to check if you do not hear from us after three days. °10. OTHER INSTRUCTIONS: _______________________________________________________________________________________________ _____________________________________________________________________________________________________________________________________ °_____________________________________________________________________________________________________________________________________ °_____________________________________________________________________________________________________________________________________ ° °WHEN TO CALL YOUR DOCTOR: °1. Fever over 101.0 °2. Nausea and/or vomiting. °3. Extreme swelling or bruising. °4. Continued bleeding from incision. °5. Increased pain, redness, or drainage from the incision. ° °The clinic staff is available to answer your questions during regular business hours.  Please don’t hesitate to call and ask to speak to one of the nurses for clinical concerns.  If you have a medical emergency, go to the nearest  emergency room or call 911.  A surgeon from [facility] is always on call at the hospital. ° °For further questions, please visit [email or web address]  ° ° ° ° °  Post Anesthesia Home Care Instructions ° °Activity: °Get plenty of rest for the remainder of the day. A responsible individual must stay with you for 24 hours following the procedure.  °For the next 24 hours, DO NOT: °-Drive a car °-Operate machinery °-Drink alcoholic beverages °-Take any medication unless instructed by your physician °-Make any legal decisions or sign important papers. ° °Meals: °Start with liquid foods such as gelatin or soup. Progress to regular foods as tolerated. Avoid greasy, spicy, heavy foods. If nausea and/or vomiting occur, drink only clear liquids until the nausea and/or vomiting subsides. Call your physician if vomiting continues. ° °Special Instructions/Symptoms: °Your throat may feel dry or sore from the anesthesia or the breathing tube placed in your throat during surgery. If this causes discomfort, gargle with warm salt water. The discomfort should disappear within 24 hours. ° °If you had a scopolamine patch placed behind your ear for the management of post- operative nausea and/or vomiting: ° °1. The medication in the patch is effective for 72 hours, after which it should be removed.  Wrap patch in a tissue and discard in the trash. Wash hands thoroughly with soap and water. °2. You may remove the patch earlier than 72 hours if you experience unpleasant side effects which may include dry mouth, dizziness or visual disturbances. °3. Avoid touching the patch. Wash your hands with soap and water after contact with the patch. °  ° °

## 2017-03-06 ENCOUNTER — Encounter (HOSPITAL_BASED_OUTPATIENT_CLINIC_OR_DEPARTMENT_OTHER): Payer: Self-pay | Admitting: Surgery

## 2017-03-06 NOTE — Anesthesia Postprocedure Evaluation (Signed)
Anesthesia Post Note  Patient: Kaylee Montgomery  Procedure(s) Performed: Procedure(s) (LRB): LEFT BREAST LUMPECTOMY WITH RADIOACTIVE SEED LOCALIZATION (Left)  Patient location during evaluation: PACU Anesthesia Type: General Level of consciousness: awake and alert Pain management: pain level controlled Vital Signs Assessment: post-procedure vital signs reviewed and stable Respiratory status: spontaneous breathing, nonlabored ventilation, respiratory function stable and patient connected to nasal cannula oxygen Cardiovascular status: blood pressure returned to baseline and stable Postop Assessment: no signs of nausea or vomiting Anesthetic complications: no       Last Vitals:  Vitals:   03/05/17 1345 03/05/17 1434  BP: (!) 134/96 (!) 138/97  Pulse: 86 81  Resp: 16 16  Temp:  36.4 C    Last Pain:  Vitals:   03/06/17 1152  TempSrc:   PainSc: 1                  Catalina Gravel

## 2017-03-06 NOTE — Telephone Encounter (Signed)
Has appt 05-26-17 at 1300 with MM/NP.  It looks like pt has been taking the gabapetin 300 po qhs.  [name] Pulling has prescribed previously but does make mention in Dr. Tobey Grim last note.

## 2017-03-20 DIAGNOSIS — K8591 Acute pancreatitis with uninfected necrosis, unspecified: Secondary | ICD-10-CM

## 2017-03-20 HISTORY — DX: Acute pancreatitis with uninfected necrosis, unspecified: K85.91

## 2017-04-09 ENCOUNTER — Other Ambulatory Visit: Payer: Self-pay | Admitting: Neurology

## 2017-04-09 DIAGNOSIS — M542 Cervicalgia: Secondary | ICD-10-CM

## 2017-04-09 MED ORDER — CYCLOBENZAPRINE HCL 5 MG PO TABS: 5.0000 mg | ORAL_TABLET | Freq: Every day | ORAL | 3 refills | Status: DC

## 2017-04-12 ENCOUNTER — Inpatient Hospital Stay (HOSPITAL_COMMUNITY)
Admission: EM | Admit: 2017-04-12 | Discharge: 2017-04-29 | DRG: 438 | Disposition: A | Discharge status: Home Health | Payer: 59 | Attending: Internal Medicine | Admitting: Internal Medicine

## 2017-04-12 ENCOUNTER — Encounter (HOSPITAL_COMMUNITY): Payer: Self-pay | Admitting: Emergency Medicine

## 2017-04-12 ENCOUNTER — Emergency Department (HOSPITAL_COMMUNITY): Payer: 59

## 2017-04-12 DIAGNOSIS — J302 Other seasonal allergic rhinitis: Secondary | ICD-10-CM | POA: Diagnosis present

## 2017-04-12 DIAGNOSIS — R310 Gross hematuria: Secondary | ICD-10-CM | POA: Diagnosis not present

## 2017-04-12 DIAGNOSIS — Z9119 Patient's noncompliance with other medical treatment and regimen: Secondary | ICD-10-CM

## 2017-04-12 DIAGNOSIS — K863 Pseudocyst of pancreas: Secondary | ICD-10-CM | POA: Diagnosis present

## 2017-04-12 DIAGNOSIS — R823 Hemoglobinuria: Secondary | ICD-10-CM | POA: Diagnosis not present

## 2017-04-12 DIAGNOSIS — R309 Painful micturition, unspecified: Secondary | ICD-10-CM | POA: Diagnosis present

## 2017-04-12 DIAGNOSIS — K859 Acute pancreatitis without necrosis or infection, unspecified: Secondary | ICD-10-CM | POA: Diagnosis not present

## 2017-04-12 DIAGNOSIS — Z79899 Other long term (current) drug therapy: Secondary | ICD-10-CM

## 2017-04-12 DIAGNOSIS — D353 Benign neoplasm of craniopharyngeal duct: Secondary | ICD-10-CM

## 2017-04-12 DIAGNOSIS — Z8249 Family history of ischemic heart disease and other diseases of the circulatory system: Secondary | ICD-10-CM

## 2017-04-12 DIAGNOSIS — R7989 Other specified abnormal findings of blood chemistry: Secondary | ICD-10-CM | POA: Diagnosis present

## 2017-04-12 DIAGNOSIS — E876 Hypokalemia: Secondary | ICD-10-CM | POA: Diagnosis present

## 2017-04-12 DIAGNOSIS — R7401 Elevation of levels of liver transaminase levels: Secondary | ICD-10-CM

## 2017-04-12 DIAGNOSIS — R945 Abnormal results of liver function studies: Secondary | ICD-10-CM | POA: Diagnosis present

## 2017-04-12 DIAGNOSIS — K8511 Biliary acute pancreatitis with uninfected necrosis: Principal | ICD-10-CM | POA: Diagnosis present

## 2017-04-12 DIAGNOSIS — R7303 Prediabetes: Secondary | ICD-10-CM | POA: Diagnosis present

## 2017-04-12 DIAGNOSIS — R531 Weakness: Secondary | ICD-10-CM | POA: Diagnosis not present

## 2017-04-12 DIAGNOSIS — Z9884 Bariatric surgery status: Secondary | ICD-10-CM

## 2017-04-12 DIAGNOSIS — E669 Obesity, unspecified: Secondary | ICD-10-CM | POA: Diagnosis present

## 2017-04-12 DIAGNOSIS — R740 Nonspecific elevation of levels of transaminase and lactic acid dehydrogenase [LDH]: Secondary | ICD-10-CM | POA: Diagnosis not present

## 2017-04-12 DIAGNOSIS — A419 Sepsis, unspecified organism: Secondary | ICD-10-CM

## 2017-04-12 DIAGNOSIS — Z6841 Body Mass Index (BMI) 40.0 and over, adult: Secondary | ICD-10-CM

## 2017-04-12 DIAGNOSIS — Z95828 Presence of other vascular implants and grafts: Secondary | ICD-10-CM

## 2017-04-12 DIAGNOSIS — F419 Anxiety disorder, unspecified: Secondary | ICD-10-CM | POA: Diagnosis present

## 2017-04-12 DIAGNOSIS — Z4659 Encounter for fitting and adjustment of other gastrointestinal appliance and device: Secondary | ICD-10-CM

## 2017-04-12 DIAGNOSIS — D352 Benign neoplasm of pituitary gland: Secondary | ICD-10-CM | POA: Diagnosis present

## 2017-04-12 DIAGNOSIS — J9811 Atelectasis: Secondary | ICD-10-CM | POA: Diagnosis present

## 2017-04-12 DIAGNOSIS — Z833 Family history of diabetes mellitus: Secondary | ICD-10-CM

## 2017-04-12 DIAGNOSIS — G4733 Obstructive sleep apnea (adult) (pediatric): Secondary | ICD-10-CM | POA: Diagnosis present

## 2017-04-12 DIAGNOSIS — R6510 Systemic inflammatory response syndrome (SIRS) of non-infectious origin without acute organ dysfunction: Secondary | ICD-10-CM | POA: Diagnosis present

## 2017-04-12 DIAGNOSIS — K8591 Acute pancreatitis with uninfected necrosis, unspecified: Secondary | ICD-10-CM

## 2017-04-12 DIAGNOSIS — D649 Anemia, unspecified: Secondary | ICD-10-CM

## 2017-04-12 DIAGNOSIS — N170 Acute kidney failure with tubular necrosis: Secondary | ICD-10-CM | POA: Diagnosis not present

## 2017-04-12 DIAGNOSIS — Z79891 Long term (current) use of opiate analgesic: Secondary | ICD-10-CM

## 2017-04-12 DIAGNOSIS — K567 Ileus, unspecified: Secondary | ICD-10-CM | POA: Diagnosis present

## 2017-04-12 DIAGNOSIS — J3089 Other allergic rhinitis: Secondary | ICD-10-CM | POA: Diagnosis present

## 2017-04-12 DIAGNOSIS — K760 Fatty (change of) liver, not elsewhere classified: Secondary | ICD-10-CM | POA: Diagnosis present

## 2017-04-12 DIAGNOSIS — J9601 Acute respiratory failure with hypoxia: Secondary | ICD-10-CM | POA: Diagnosis present

## 2017-04-12 DIAGNOSIS — R109 Unspecified abdominal pain: Secondary | ICD-10-CM

## 2017-04-12 DIAGNOSIS — J9600 Acute respiratory failure, unspecified whether with hypoxia or hypercapnia: Secondary | ICD-10-CM

## 2017-04-12 DIAGNOSIS — I10 Essential (primary) hypertension: Secondary | ICD-10-CM | POA: Diagnosis present

## 2017-04-12 DIAGNOSIS — J90 Pleural effusion, not elsewhere classified: Secondary | ICD-10-CM | POA: Diagnosis present

## 2017-04-12 DIAGNOSIS — D6489 Other specified anemias: Secondary | ICD-10-CM | POA: Diagnosis present

## 2017-04-12 DIAGNOSIS — I82890 Acute embolism and thrombosis of other specified veins: Secondary | ICD-10-CM | POA: Diagnosis present

## 2017-04-12 DIAGNOSIS — N63 Unspecified lump in unspecified breast: Secondary | ICD-10-CM | POA: Diagnosis present

## 2017-04-12 DIAGNOSIS — F1010 Alcohol abuse, uncomplicated: Secondary | ICD-10-CM | POA: Diagnosis present

## 2017-04-12 DIAGNOSIS — G43909 Migraine, unspecified, not intractable, without status migrainosus: Secondary | ICD-10-CM | POA: Diagnosis present

## 2017-04-12 DIAGNOSIS — E872 Acidosis: Secondary | ICD-10-CM | POA: Diagnosis present

## 2017-04-12 LAB — COMPREHENSIVE METABOLIC PANEL
ALT: 263 U/L — ABNORMAL HIGH (ref 14–54)
AST: 332 U/L — ABNORMAL HIGH (ref 15–41)
Albumin: 4.6 g/dL (ref 3.5–5.0)
Alkaline Phosphatase: 94 U/L (ref 38–126)
Anion gap: 11 (ref 5–15)
BUN: 9 mg/dL (ref 6–20)
CO2: 25 mmol/L (ref 22–32)
Calcium: 10 mg/dL (ref 8.9–10.3)
Chloride: 107 mmol/L (ref 101–111)
Creatinine, Ser: 0.59 mg/dL (ref 0.44–1.00)
GFR calc Af Amer: >60 mL/min (ref >60)
GFR calc non Af Amer: >60 mL/min (ref >60)
Glucose, Bld: 204 mg/dL — ABNORMAL HIGH (ref 65–99)
Potassium: 2.8 mmol/L — ABNORMAL LOW (ref 3.5–5.1)
Sodium: 143 mmol/L (ref 135–145)
Total Bilirubin: 2.3 mg/dL — ABNORMAL HIGH (ref 0.3–1.2)
Total Protein: 7.9 g/dL (ref 6.5–8.1)

## 2017-04-12 LAB — CBC
HCT: 42.9 % (ref 36.0–46.0)
Hemoglobin: 14.1 g/dL (ref 12.0–15.0)
MCH: 25.7 pg — ABNORMAL LOW (ref 26.0–34.0)
MCHC: 32.9 g/dL (ref 30.0–36.0)
MCV: 78.3 fL (ref 78.0–100.0)
Platelets: 391 10*3/uL (ref 150–400)
RBC: 5.48 MIL/uL — ABNORMAL HIGH (ref 3.87–5.11)
RDW: 13.3 % (ref 11.5–15.5)
WBC: 14.7 10*3/uL — ABNORMAL HIGH (ref 4.0–10.5)

## 2017-04-12 LAB — DIFFERENTIAL
Basophils Absolute: 0 10*3/uL (ref 0.0–0.1)
Basophils Relative: 0 %
Eosinophils Absolute: 0 10*3/uL (ref 0.0–0.7)
Eosinophils Relative: 0 %
Lymphocytes Relative: 14 %
Lymphs Abs: 2 10*3/uL (ref 0.7–4.0)
Monocytes Absolute: 1.7 10*3/uL — ABNORMAL HIGH (ref 0.1–1.0)
Monocytes Relative: 12 %
Neutro Abs: 10.8 10*3/uL — ABNORMAL HIGH (ref 1.7–7.7)
Neutrophils Relative %: 74 %

## 2017-04-12 LAB — HCG, SERUM, QUALITATIVE: Preg, Serum: NEGATIVE

## 2017-04-12 LAB — LIPASE, BLOOD: Lipase: 6774 U/L — ABNORMAL HIGH (ref 11–51)

## 2017-04-12 MED ORDER — MORPHINE SULFATE (PF) 4 MG/ML IV SOLN
4.0000 mg | Freq: Once | INTRAVENOUS | Status: AC
  Administered 2017-04-12: 4 mg via INTRAMUSCULAR
  Filled 2017-04-12: qty 1

## 2017-04-12 MED ORDER — SODIUM CHLORIDE 0.9 % IV BOLUS (SEPSIS)
500.0000 mL | Freq: Once | INTRAVENOUS | Status: AC
  Administered 2017-04-12: 500 mL via INTRAVENOUS

## 2017-04-12 MED ORDER — HYDROMORPHONE HCL 1 MG/ML IJ SOLN
1.0000 mg | INTRAMUSCULAR | Status: AC | PRN
  Administered 2017-04-13 (×2): 1 mg via INTRAVENOUS
  Filled 2017-04-12 (×3): qty 1

## 2017-04-12 MED ORDER — POTASSIUM CHLORIDE 10 MEQ/100ML IV SOLN
10.0000 meq | Freq: Once | INTRAVENOUS | Status: AC
  Administered 2017-04-12: 10 meq via INTRAVENOUS
  Filled 2017-04-12: qty 100

## 2017-04-12 MED ORDER — SODIUM CHLORIDE 0.9 % IV SOLN
INTRAVENOUS | Status: DC
  Administered 2017-04-12: 22:00:00 via INTRAVENOUS

## 2017-04-12 MED ORDER — ONDANSETRON HCL 4 MG/2ML IJ SOLN
4.0000 mg | INTRAMUSCULAR | Status: AC | PRN
  Administered 2017-04-12 – 2017-04-13 (×2): 4 mg via INTRAVENOUS
  Filled 2017-04-12 (×2): qty 2

## 2017-04-12 MED ORDER — MORPHINE SULFATE (PF) 4 MG/ML IV SOLN
4.0000 mg | INTRAVENOUS | Status: AC | PRN
  Administered 2017-04-12 – 2017-04-13 (×2): 4 mg via INTRAVENOUS
  Filled 2017-04-12 (×2): qty 1

## 2017-04-12 MED ORDER — ONDANSETRON 4 MG PO TBDP
4.0000 mg | ORAL_TABLET | Freq: Once | ORAL | Status: AC | PRN
  Administered 2017-04-12: 4 mg via ORAL
  Filled 2017-04-12: qty 1

## 2017-04-12 MED ORDER — FAMOTIDINE IN NACL 20-0.9 MG/50ML-% IV SOLN
20.0000 mg | Freq: Once | INTRAVENOUS | Status: AC
  Administered 2017-04-12: 20 mg via INTRAVENOUS
  Filled 2017-04-12: qty 50

## 2017-04-12 NOTE — ED Notes (Signed)
Patient transported to CT 

## 2017-04-12 NOTE — ED Notes (Signed)
Marita Kansas, RN attempted x 2 IV access with no success.

## 2017-04-12 NOTE — ED Notes (Signed)
Rip Harbour, RN at bedside for attempt at IV access

## 2017-04-12 NOTE — ED Notes (Signed)
IV access attempted x 2 with no success. Traci Sermon, RN at bedside for attempt now.

## 2017-04-12 NOTE — ED Notes (Signed)
Pt states she does not need to urinate at this time, aware of DO  

## 2017-04-12 NOTE — ED Provider Notes (Signed)
[facility] DEPT Provider Note   CSN: [id number] Arrival date & time: 04/12/17  1810     History   Chief Complaint Chief Complaint  Patient presents with  . Abdominal Pain    HPI Kaylee Montgomery is a 36 y.o. female.  HPI  Pt was seen at 2115.  Per pt, c/o gradual onset and persistence of constant upper abd "pain" since approximately 1130 this morning.  Has been associated with multiple intermittent episodes of N/V/D and "blood" in her emesis.  Describes the abd pain as "cramping."  Last meal was approximately 0830 (french toast) before her symptoms began. Denies fevers, no back pain, no rash, no CP/SOB, no black or blood in stools.      Past Medical History:  Diagnosis Date  . Breast mass 02/2017  . Dental crown present   . Hypertension    states BP fluctuates; has been on med. x 1 yr.  . Migraines   . Pituitary adenoma ([place])   . Seasonal allergies   . Sleep apnea    no CPAP use in > 6 mos.    Patient Active Problem List   Diagnosis Date Noted  . Dense breast tissue 01/13/2017  . Fatty liver 02/18/2016  . 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 Common migraine with intractable migraine 02/23/2015  . Annual physical exam 11/17/2013  . Obstructive sleep apnea 10/19/2013  . Prediabetes 04/30/2013  . Essential hypertension 07/17/2008  . 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 Headache 05/31/2008  . PITUITARY ADENOMA 02/17/2008  . Morbid obesity ([place]) 02/17/2008  . Seasonal and perennial allergic rhinitis 02/17/2008  . GYNECOMASTIA 02/17/2008    Past Surgical History:  Procedure Laterality Date  . BREAST LUMPECTOMY WITH RADIOACTIVE SEED LOCALIZATION Left 03/05/2017   Procedure: LEFT BREAST LUMPECTOMY WITH RADIOACTIVE SEED LOCALIZATION;  Surgeon: Donnie Mesa, MD;  Location: [facility];  Service: General;  Laterality: Left;  . CESAREAN SECTION    . LAPAROSCOPIC GASTRIC SLEEVE RESECTION N/A 02/18/2016    Procedure: LAPAROSCOPIC GASTRIC SLEEVE RESECTION, UPPER ENDO;  Surgeon: Greer Pickerel, MD;  Location: [facility];  Service: General;  Laterality: N/A;  . 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 TONSILLECTOMY  age 16    OB History    No data available       Home Medications    Prior to Admission medications   Medication Sig Start Date End Date Taking? Authorizing Provider  cyclobenzaprine (FLEXERIL) 5 MG tablet Take 1 tablet (5 mg total) by mouth at bedtime. 04/09/17   Kathrynn Ducking, MD  gabapentin (NEURONTIN) 300 MG capsule Take 1 capsule (300 mg total) by mouth at bedtime. 02/20/16   Greer Pickerel, MD  gabapentin (NEURONTIN) 300 MG capsule TAKE 1 CAPSULE BY MOUTH 2 TIMES DAILY. 03/06/17   Kathrynn Ducking, MD  HYDROcodone-acetaminophen (NORCO/VICODIN) 5-325 MG tablet Take 1-2 tablets by mouth every 6 (six) hours as needed for moderate pain. 03/05/17   Donnie Mesa, MD  metoprolol succinate (TOPROL XL) 50 MG 24 hr tablet Take 1 tablet (50 mg total) by mouth daily. Take with or immediately following a meal. 11/18/16   Kathrynn Ducking, MD    Family History Family History  Problem Relation Age of Onset  . Heart disease Father        stent  . Migraines Father   . Migraines Son   . Diabetes Maternal Grandfather  Social History Social History  Substance Use Topics  . Smoking status: Never Smoker  . Smokeless tobacco: Never Used  . Alcohol use 0.0 oz/week     Comment: occasionally     Allergies   Bromocriptine mesylate; Depo-medrol [methylprednisolone acetate]; and Imitrex [sumatriptan]   Review of Systems Review of Systems ROS: Statement: All systems negative except as marked or noted in the HPI; Constitutional: Negative for fever and chills. ; ; Eyes: Negative for eye pain, redness and discharge. ; ; ENMT: Negative for ear pain, hoarseness, nasal congestion, sinus pressure and sore throat. ; ;  Cardiovascular: Negative for chest pain, palpitations, diaphoresis, dyspnea and peripheral edema. ; ; Respiratory: Negative for cough, wheezing and stridor. ; ; Gastrointestinal: +N/V/D, abd pain, "blood in emesis." Negative for blood in stool, jaundice and rectal bleeding. . ; ; Genitourinary: Negative for dysuria, flank pain and hematuria. ; ; Musculoskeletal: Negative for back pain and neck pain. Negative for swelling and trauma.; ; Skin: Negative for pruritus, rash, abrasions, blisters, bruising and skin lesion.; ; Neuro: Negative for headache, lightheadedness and neck stiffness. Negative for weakness, altered level of consciousness, altered mental status, extremity weakness, paresthesias, involuntary movement, seizure and syncope.       Physical Exam Updated Vital Signs BP (!) 141/89 (BP Location: Right Arm)   Pulse (!) 108   Temp 97 F (36.1 C) (Oral)   Resp 20   Ht 5\' 3"  (1.6 m)   Wt 99.3 kg (219 lb)   SpO2 100%   BMI 38.79 kg/m   Physical Exam 2120: Physical examination:  Nursing notes reviewed; Vital signs and O2 SAT reviewed;  Constitutional: Well developed, Well nourished, Uncomfortable appearing.; Head:  Normocephalic, atraumatic; Eyes: EOMI, PERRL, No scleral icterus; ENMT: Mouth and pharynx normal, Mucous membranes dry; Neck: Supple, Full range of motion, No lymphadenopathy; Cardiovascular: Tachycardic rate and rhythm, No gallop; Respiratory: Breath sounds clear & equal bilaterally, No wheezes.  Speaking full sentences with ease, Normal respiratory effort/excursion; Chest: Nontender, Movement normal; Abdomen: Soft, +RUQ, mid-epigastric, LUQ tender to palp. No rebound or guarding. Nondistended, Normal bowel sounds; Genitourinary: No CVA tenderness; Extremities: Pulses normal, No tenderness, No edema, No calf edema or asymmetry.; Neuro: AA&Ox3, Major CN grossly intact.  Speech clear. No gross focal motor or sensory deficits in extremities.; Skin: Color normal, Warm, Dry.   ED  Treatments / Results  Labs (all labs ordered are listed, but only abnormal results are displayed)   EKG  EKG Interpretation None       Radiology   Procedures Procedures (including critical care time)  Medications Ordered in ED Medications  ondansetron (ZOFRAN-ODT) disintegrating tablet 4 mg (4 mg Oral Given 04/12/17 1837)     Initial Impression / Assessment and Plan / ED Course  I have reviewed the triage vital signs and the nursing notes.  Pertinent labs & imaging results that were available during my care of the patient were reviewed by me and considered in my medical decision making (see chart for details).  MDM Reviewed: previous chart, nursing note and vitals Reviewed previous: labs Interpretation: labs   Results for orders placed or performed during the hospital encounter of 04/12/17  Lipase, blood  Result Value Ref Range   Lipase 6,774 (H) 11 - 51 U/L  Comprehensive metabolic panel  Result Value Ref Range   Sodium 143 135 - 145 mmol/L   Potassium 2.8 (L) 3.5 - 5.1 mmol/L   Chloride 107 101 - 111 mmol/L   CO2 25 22 - 32  mmol/L   Glucose, Bld 204 (H) 65 - 99 mg/dL   BUN 9 6 - 20 mg/dL   Creatinine, Ser 0.59 0.44 - 1.00 mg/dL   Calcium 10.0 8.9 - 10.3 mg/dL   Total Protein 7.9 6.5 - 8.1 g/dL   Albumin 4.6 3.5 - 5.0 g/dL   AST 332 (H) 15 - 41 U/L   ALT 263 (H) 14 - 54 U/L   Alkaline Phosphatase 94 38 - 126 U/L   Total Bilirubin 2.3 (H) 0.3 - 1.2 mg/dL   GFR calc non Af Amer >60 >60 mL/min   GFR calc Af Amer >60 >60 mL/min   Anion gap 11 5 - 15  CBC  Result Value Ref Range   WBC 14.7 (H) 4.0 - 10.5 K/uL   RBC 5.48 (H) 3.87 - 5.11 MIL/uL   Hemoglobin 14.1 12.0 - 15.0 g/dL   HCT 42.9 36.0 - 46.0 %   MCV 78.3 78.0 - 100.0 fL   MCH 25.7 (L) 26.0 - 34.0 pg   MCHC 32.9 30.0 - 36.0 g/dL   RDW 13.3 11.5 - 15.5 %   Platelets 391 150 - 400 K/uL  Differential  Result Value Ref Range   Neutrophils Relative % 74 %   Neutro Abs 10.8 (H) 1.7 - 7.7 K/uL    Lymphocytes Relative 14 %   Lymphs Abs 2.0 0.7 - 4.0 K/uL   Monocytes Relative 12 %   Monocytes Absolute 1.7 (H) 0.1 - 1.0 K/uL   Eosinophils Relative 0 %   Eosinophils Absolute 0.0 0.0 - 0.7 K/uL   Basophils Relative 0 %   Basophils Absolute 0.0 0.0 - 0.1 K/uL  hCG, serum, qualitative  Result Value Ref Range   Preg, Serum NEGATIVE NEGATIVE    2355:  CT A/P pending. Will need admission. Sign out to Dr. Betsey Holiday.     Final Clinical Impressions(s) / ED Diagnoses   Final diagnoses:  None    New Prescriptions New Prescriptions   No medications on file     Francine Graven, DO 04/12/17 2355

## 2017-04-12 NOTE — ED Triage Notes (Signed)
Patient c/o upper abd pain. Per patient nausea, vomiting, and diarrhea. Denies any blood in stool but has bright red blood in emesis. Patient has generalized weakness. Denies any fevers.

## 2017-04-13 ENCOUNTER — Encounter (HOSPITAL_COMMUNITY): Payer: Self-pay | Admitting: Gastroenterology

## 2017-04-13 ENCOUNTER — Inpatient Hospital Stay (HOSPITAL_COMMUNITY): Payer: 59

## 2017-04-13 DIAGNOSIS — D649 Anemia, unspecified: Secondary | ICD-10-CM | POA: Diagnosis not present

## 2017-04-13 DIAGNOSIS — R109 Unspecified abdominal pain: Secondary | ICD-10-CM | POA: Diagnosis not present

## 2017-04-13 DIAGNOSIS — E872 Acidosis: Secondary | ICD-10-CM | POA: Diagnosis not present

## 2017-04-13 DIAGNOSIS — K8500 Idiopathic acute pancreatitis without necrosis or infection: Secondary | ICD-10-CM | POA: Diagnosis not present

## 2017-04-13 DIAGNOSIS — G43909 Migraine, unspecified, not intractable, without status migrainosus: Secondary | ICD-10-CM | POA: Diagnosis present

## 2017-04-13 DIAGNOSIS — R0600 Dyspnea, unspecified: Secondary | ICD-10-CM | POA: Diagnosis not present

## 2017-04-13 DIAGNOSIS — I10 Essential (primary) hypertension: Secondary | ICD-10-CM | POA: Diagnosis not present

## 2017-04-13 DIAGNOSIS — Z9884 Bariatric surgery status: Secondary | ICD-10-CM | POA: Diagnosis not present

## 2017-04-13 DIAGNOSIS — D352 Benign neoplasm of pituitary gland: Secondary | ICD-10-CM

## 2017-04-13 DIAGNOSIS — J9811 Atelectasis: Secondary | ICD-10-CM | POA: Diagnosis present

## 2017-04-13 DIAGNOSIS — G4733 Obstructive sleep apnea (adult) (pediatric): Secondary | ICD-10-CM | POA: Diagnosis not present

## 2017-04-13 DIAGNOSIS — D6489 Other specified anemias: Secondary | ICD-10-CM | POA: Diagnosis present

## 2017-04-13 DIAGNOSIS — R918 Other nonspecific abnormal finding of lung field: Secondary | ICD-10-CM | POA: Diagnosis not present

## 2017-04-13 DIAGNOSIS — F419 Anxiety disorder, unspecified: Secondary | ICD-10-CM | POA: Diagnosis present

## 2017-04-13 DIAGNOSIS — K863 Pseudocyst of pancreas: Secondary | ICD-10-CM | POA: Diagnosis not present

## 2017-04-13 DIAGNOSIS — J80 Acute respiratory distress syndrome: Secondary | ICD-10-CM | POA: Diagnosis not present

## 2017-04-13 DIAGNOSIS — J90 Pleural effusion, not elsewhere classified: Secondary | ICD-10-CM | POA: Diagnosis present

## 2017-04-13 DIAGNOSIS — K8511 Biliary acute pancreatitis with uninfected necrosis: Secondary | ICD-10-CM | POA: Diagnosis not present

## 2017-04-13 DIAGNOSIS — R740 Nonspecific elevation of levels of transaminase and lactic acid dehydrogenase [LDH]: Secondary | ICD-10-CM | POA: Diagnosis not present

## 2017-04-13 DIAGNOSIS — K859 Acute pancreatitis without necrosis or infection, unspecified: Secondary | ICD-10-CM | POA: Diagnosis not present

## 2017-04-13 DIAGNOSIS — A419 Sepsis, unspecified organism: Secondary | ICD-10-CM | POA: Diagnosis not present

## 2017-04-13 DIAGNOSIS — K8591 Acute pancreatitis with uninfected necrosis, unspecified: Secondary | ICD-10-CM | POA: Diagnosis present

## 2017-04-13 DIAGNOSIS — K858 Other acute pancreatitis without necrosis or infection: Secondary | ICD-10-CM | POA: Diagnosis not present

## 2017-04-13 DIAGNOSIS — K861 Other chronic pancreatitis: Secondary | ICD-10-CM | POA: Diagnosis not present

## 2017-04-13 DIAGNOSIS — R823 Hemoglobinuria: Secondary | ICD-10-CM | POA: Diagnosis not present

## 2017-04-13 DIAGNOSIS — I82890 Acute embolism and thrombosis of other specified veins: Secondary | ICD-10-CM | POA: Diagnosis present

## 2017-04-13 DIAGNOSIS — J9601 Acute respiratory failure with hypoxia: Secondary | ICD-10-CM | POA: Diagnosis not present

## 2017-04-13 DIAGNOSIS — E876 Hypokalemia: Secondary | ICD-10-CM | POA: Diagnosis not present

## 2017-04-13 DIAGNOSIS — R7303 Prediabetes: Secondary | ICD-10-CM | POA: Diagnosis present

## 2017-04-13 DIAGNOSIS — R6511 Systemic inflammatory response syndrome (SIRS) of non-infectious origin with acute organ dysfunction: Secondary | ICD-10-CM | POA: Diagnosis not present

## 2017-04-13 DIAGNOSIS — R1110 Vomiting, unspecified: Secondary | ICD-10-CM | POA: Diagnosis not present

## 2017-04-13 DIAGNOSIS — K8501 Idiopathic acute pancreatitis with uninfected necrosis: Secondary | ICD-10-CM | POA: Diagnosis not present

## 2017-04-13 DIAGNOSIS — R310 Gross hematuria: Secondary | ICD-10-CM | POA: Diagnosis not present

## 2017-04-13 DIAGNOSIS — R000 Tachycardia, unspecified: Secondary | ICD-10-CM | POA: Diagnosis not present

## 2017-04-13 DIAGNOSIS — Z452 Encounter for adjustment and management of vascular access device: Secondary | ICD-10-CM | POA: Diagnosis not present

## 2017-04-13 DIAGNOSIS — K567 Ileus, unspecified: Secondary | ICD-10-CM | POA: Diagnosis present

## 2017-04-13 DIAGNOSIS — K851 Biliary acute pancreatitis without necrosis or infection: Secondary | ICD-10-CM | POA: Diagnosis not present

## 2017-04-13 DIAGNOSIS — Z978 Presence of other specified devices: Secondary | ICD-10-CM | POA: Diagnosis not present

## 2017-04-13 DIAGNOSIS — D353 Benign neoplasm of craniopharyngeal duct: Secondary | ICD-10-CM | POA: Diagnosis not present

## 2017-04-13 DIAGNOSIS — J3089 Other allergic rhinitis: Secondary | ICD-10-CM | POA: Diagnosis present

## 2017-04-13 DIAGNOSIS — R6510 Systemic inflammatory response syndrome (SIRS) of non-infectious origin without acute organ dysfunction: Secondary | ICD-10-CM | POA: Diagnosis not present

## 2017-04-13 DIAGNOSIS — K8689 Other specified diseases of pancreas: Secondary | ICD-10-CM | POA: Diagnosis not present

## 2017-04-13 DIAGNOSIS — N170 Acute kidney failure with tubular necrosis: Secondary | ICD-10-CM | POA: Diagnosis not present

## 2017-04-13 DIAGNOSIS — M542 Cervicalgia: Secondary | ICD-10-CM | POA: Diagnosis not present

## 2017-04-13 DIAGNOSIS — R197 Diarrhea, unspecified: Secondary | ICD-10-CM | POA: Diagnosis not present

## 2017-04-13 DIAGNOSIS — N63 Unspecified lump in unspecified breast: Secondary | ICD-10-CM | POA: Diagnosis present

## 2017-04-13 DIAGNOSIS — R112 Nausea with vomiting, unspecified: Secondary | ICD-10-CM | POA: Diagnosis not present

## 2017-04-13 LAB — URINALYSIS, ROUTINE W REFLEX MICROSCOPIC
Bacteria, UA: NONE SEEN
Bilirubin Urine: NEGATIVE
Glucose, UA: 50 mg/dL — AB
Hgb urine dipstick: NEGATIVE
Ketones, ur: 20 mg/dL — AB
Leukocytes, UA: NEGATIVE
Nitrite: NEGATIVE
Protein, ur: 30 mg/dL — AB
Specific Gravity, Urine: >1.046 — ABNORMAL HIGH (ref 1.005–1.030)
pH: 6 (ref 5.0–8.0)

## 2017-04-13 LAB — CBC
HCT: 49.2 % — ABNORMAL HIGH (ref 36.0–46.0)
Hemoglobin: 16.2 g/dL — ABNORMAL HIGH (ref 12.0–15.0)
MCH: 25.4 pg — ABNORMAL LOW (ref 26.0–34.0)
MCHC: 32.9 g/dL (ref 30.0–36.0)
MCV: 77.2 fL — ABNORMAL LOW (ref 78.0–100.0)
Platelets: 364 10*3/uL (ref 150–400)
RBC: 6.37 MIL/uL — ABNORMAL HIGH (ref 3.87–5.11)
RDW: 14.6 % (ref 11.5–15.5)
WBC: 12.8 10*3/uL — ABNORMAL HIGH (ref 4.0–10.5)

## 2017-04-13 LAB — LIPID PANEL
Cholesterol: 156 mg/dL (ref 0–200)
HDL: 35 mg/dL — ABNORMAL LOW (ref >40)
LDL Cholesterol: 101 mg/dL — ABNORMAL HIGH (ref 0–99)
Total CHOL/HDL Ratio: 4.5 RATIO
Triglycerides: 101 mg/dL (ref <150)
VLDL: 20 mg/dL (ref 0–40)

## 2017-04-13 LAB — COMPREHENSIVE METABOLIC PANEL
ALT: 530 U/L — ABNORMAL HIGH (ref 14–54)
AST: 469 U/L — ABNORMAL HIGH (ref 15–41)
Albumin: 4.3 g/dL (ref 3.5–5.0)
Alkaline Phosphatase: 104 U/L (ref 38–126)
Anion gap: 15 (ref 5–15)
BUN: 8 mg/dL (ref 6–20)
CO2: 22 mmol/L (ref 22–32)
Calcium: 9.1 mg/dL (ref 8.9–10.3)
Chloride: 103 mmol/L (ref 101–111)
Creatinine, Ser: 0.56 mg/dL (ref 0.44–1.00)
GFR calc Af Amer: >60 mL/min (ref >60)
GFR calc non Af Amer: >60 mL/min (ref >60)
Glucose, Bld: 235 mg/dL — ABNORMAL HIGH (ref 65–99)
Potassium: 3.2 mmol/L — ABNORMAL LOW (ref 3.5–5.1)
Sodium: 140 mmol/L (ref 135–145)
Total Bilirubin: 4 mg/dL — ABNORMAL HIGH (ref 0.3–1.2)
Total Protein: 7.5 g/dL (ref 6.5–8.1)

## 2017-04-13 LAB — LIPASE, BLOOD: Lipase: 1167 U/L — ABNORMAL HIGH (ref 11–51)

## 2017-04-13 LAB — MRSA PCR SCREENING: MRSA by PCR: NEGATIVE

## 2017-04-13 LAB — TSH: TSH: 1.526 u[IU]/mL (ref 0.350–4.500)

## 2017-04-13 LAB — BILIRUBIN, DIRECT: Bilirubin, Direct: 1.2 mg/dL — ABNORMAL HIGH (ref 0.1–0.5)

## 2017-04-13 LAB — PREGNANCY, URINE: Preg Test, Ur: NEGATIVE

## 2017-04-13 LAB — GAMMA GT: GGT: 594 U/L — ABNORMAL HIGH (ref 7–50)

## 2017-04-13 MED ORDER — HYDRALAZINE HCL 20 MG/ML IJ SOLN: 5.0000 mg | Freq: Four times a day (QID) | INTRAMUSCULAR | Status: DC | PRN

## 2017-04-13 MED ORDER — LABETALOL HCL 5 MG/ML IV SOLN
10.0000 mg | Freq: Three times a day (TID) | INTRAVENOUS | Status: DC
  Administered 2017-04-13 (×3): 10 mg via INTRAVENOUS
  Filled 2017-04-13 (×3): qty 4

## 2017-04-13 MED ORDER — DEXTROSE IN LACTATED RINGERS 5 % IV SOLN
INTRAVENOUS | Status: DC
  Administered 2017-04-13 (×2): via INTRAVENOUS

## 2017-04-13 MED ORDER — HEPARIN SODIUM (PORCINE) 5000 UNIT/ML IJ SOLN
5000.0000 [IU] | Freq: Three times a day (TID) | INTRAMUSCULAR | Status: DC
  Administered 2017-04-13 (×3): 5000 [IU] via SUBCUTANEOUS
  Filled 2017-04-13 (×3): qty 1

## 2017-04-13 MED ORDER — LABETALOL HCL 5 MG/ML IV SOLN
5.0000 mg | Freq: Four times a day (QID) | INTRAVENOUS | Status: DC | PRN
  Administered 2017-04-13 – 2017-04-19 (×8): 5 mg via INTRAVENOUS
  Filled 2017-04-13 (×8): qty 4

## 2017-04-13 MED ORDER — SODIUM CHLORIDE 0.9 % IV SOLN
INTRAVENOUS | Status: DC
  Administered 2017-04-13 (×3): via INTRAVENOUS

## 2017-04-13 MED ORDER — DEXTROSE IN LACTATED RINGERS 5 % IV SOLN: INTRAVENOUS | Status: DC

## 2017-04-13 MED ORDER — METOPROLOL SUCCINATE ER 50 MG PO TB24
50.0000 mg | ORAL_TABLET | Freq: Every day | ORAL | Status: DC
  Administered 2017-04-13: 50 mg via ORAL
  Filled 2017-04-13: qty 1

## 2017-04-13 MED ORDER — IOPAMIDOL (ISOVUE-300) INJECTION 61%
100.0000 mL | Freq: Once | INTRAVENOUS | Status: AC | PRN
  Administered 2017-04-13: 100 mL via INTRAVENOUS

## 2017-04-13 MED ORDER — HYDROMORPHONE HCL 1 MG/ML IJ SOLN
1.0000 mg | Freq: Four times a day (QID) | INTRAMUSCULAR | Status: DC
  Administered 2017-04-13 – 2017-04-15 (×7): 1 mg via INTRAVENOUS
  Filled 2017-04-13 (×7): qty 1

## 2017-04-13 MED ORDER — HYDROMORPHONE HCL 1 MG/ML IJ SOLN
0.5000 mg | INTRAMUSCULAR | Status: DC | PRN
  Administered 2017-04-13 – 2017-04-14 (×5): 0.5 mg via INTRAVENOUS
  Filled 2017-04-13 (×5): qty 1

## 2017-04-13 MED ORDER — POTASSIUM CHLORIDE 10 MEQ/100ML IV SOLN
10.0000 meq | INTRAVENOUS | Status: AC
  Administered 2017-04-13 (×4): 10 meq via INTRAVENOUS
  Filled 2017-04-13 (×3): qty 100

## 2017-04-13 MED ORDER — ONDANSETRON HCL 4 MG/2ML IJ SOLN
4.0000 mg | Freq: Four times a day (QID) | INTRAMUSCULAR | Status: DC | PRN
  Administered 2017-04-13 – 2017-04-14 (×3): 4 mg via INTRAVENOUS
  Filled 2017-04-13: qty 2

## 2017-04-13 MED ORDER — ACETAMINOPHEN 325 MG PO TABS
650.0000 mg | ORAL_TABLET | Freq: Four times a day (QID) | ORAL | Status: DC | PRN
  Administered 2017-04-15 – 2017-04-20 (×14): 650 mg via ORAL
  Filled 2017-04-13 (×13): qty 2

## 2017-04-13 MED ORDER — FENTANYL CITRATE (PF) 100 MCG/2ML IJ SOLN
50.0000 ug | Freq: Once | INTRAMUSCULAR | Status: AC
  Administered 2017-04-13: 50 ug via INTRAVENOUS
  Filled 2017-04-13: qty 2

## 2017-04-13 MED ORDER — PANTOPRAZOLE SODIUM 40 MG IV SOLR
40.0000 mg | Freq: Two times a day (BID) | INTRAVENOUS | Status: DC
  Administered 2017-04-13 – 2017-04-16 (×6): 40 mg via INTRAVENOUS
  Filled 2017-04-13 (×6): qty 40

## 2017-04-13 MED ORDER — ONDANSETRON HCL 4 MG/2ML IJ SOLN
4.0000 mg | Freq: Three times a day (TID) | INTRAMUSCULAR | Status: DC
  Administered 2017-04-13 – 2017-04-18 (×17): 4 mg via INTRAVENOUS
  Filled 2017-04-13 (×17): qty 2

## 2017-04-13 MED ORDER — POTASSIUM CHLORIDE 10 MEQ/100ML IV SOLN
10.0000 meq | INTRAVENOUS | Status: AC
  Administered 2017-04-13 (×3): 10 meq via INTRAVENOUS
  Filled 2017-04-13 (×3): qty 100

## 2017-04-13 MED ORDER — DIGOXIN 0.25 MG/ML IJ SOLN
0.2500 mg | Freq: Once | INTRAMUSCULAR | Status: AC
  Administered 2017-04-13: 0.25 mg via INTRAVENOUS
  Filled 2017-04-13: qty 2

## 2017-04-13 MED ORDER — FENTANYL CITRATE (PF) 100 MCG/2ML IJ SOLN
50.0000 ug | INTRAMUSCULAR | Status: DC | PRN
  Administered 2017-04-13 (×6): 50 ug via INTRAVENOUS
  Filled 2017-04-13 (×6): qty 2

## 2017-04-13 NOTE — Progress Notes (Signed)
Inpatient Diabetes Program Recommendations  AACE/ADA: New Consensus Statement on Inpatient Glycemic Control (2015)  Target Ranges:  Prepandial:   less than 140 mg/dL      Peak postprandial:   less than 180 mg/dL (1-2 hours)      Critically ill patients:  140 - 180 mg/dL   Lab Results  Component Value Date   GLUCAP 131 (H) 02/20/2016   HGBA1C 6.2 (H) 01/10/2015    Review of Glycemic Control  Inpatient Diabetes Program Recommendations:  Noted hx prediabetes. Please consider Glycemic control orders with sensitive correction. Will follow.  Thank you, Nani Gasser. Yazir Koerber, RN, MSN, CDE  Diabetes Coordinator Inpatient Glycemic Control Team Team Pager [phone] (8am-5pm) 04/13/2017 2:37 PM

## 2017-04-13 NOTE — Consult Note (Signed)
Primary cardiologist: n/a Consulting cardiologist:Dr Carlyle Dolly MD Requesting Physician: Dr Leisa Lenz Indication: HTN, tachycardia   Clinical Summary Kaylee Montgomery is a 36 y.o.female history of morbid obesity, HTN, pituitraty tumor, OSA admitted with N/V and abomdinal pain. Being managed for pancreatitis by primary team. Cardiology is consulted for HTN and tachycardia.    Lipase 6700, K 2.8, Cr 0.59, Hgb 14.1, Plt 391, urine preg neg,  CXR no acute process EKG there is no EKG on file  Allergies  Allergen Reactions  . Bromocriptine Mesylate Nausea Only  . Depo-Medrol [Methylprednisolone Acetate] Itching and Rash  . Imitrex [Sumatriptan] Rash    Medications Scheduled Medications: . heparin  5,000 Units Subcutaneous Q8H  . metoprolol succinate  50 mg Oral Daily     Infusions: . sodium chloride 250 mL/hr at 04/13/17 0856  . potassium chloride 10 mEq (04/13/17 0856)     PRN Medications:  fentaNYL (SUBLIMAZE) injection, labetalol   Past Medical History:  Diagnosis Date  . Breast mass 02/2017  . Dental crown present   . Hypertension    states BP fluctuates; has been on med. x 1 yr.  . Migraines   . Pituitary adenoma ([place])   . Seasonal allergies   . Sleep apnea    no CPAP use in > 6 mos.    Past Surgical History:  Procedure Laterality Date  . BREAST LUMPECTOMY WITH RADIOACTIVE SEED LOCALIZATION Left 03/05/2017   Procedure: LEFT BREAST LUMPECTOMY WITH RADIOACTIVE SEED LOCALIZATION;  Surgeon: Donnie Mesa, MD;  Location: [facility];  Service: General;  Laterality: Left;  . CESAREAN SECTION    . LAPAROSCOPIC GASTRIC SLEEVE RESECTION N/A 02/18/2016   Procedure: LAPAROSCOPIC GASTRIC SLEEVE RESECTION, UPPER ENDO;  Surgeon: Greer Pickerel, MD;  Location: [facility];  Service: General;  Laterality: N/A;  . 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 TONSILLECTOMY  age 38    Family  History  Problem Relation Age of Onset  . Heart disease Father        stent  . Migraines Father   . Migraines Son   . Diabetes Maternal Grandfather     Social History [name] reports that she has never smoked. She has never used smokeless tobacco. [name] reports that she drinks alcohol.  Review of Systems CONSTITUTIONAL: No weight loss, fever, chills, weakness or fatigue.  HEENT: Eyes: No visual loss, blurred vision, double vision or yellow sclerae. No hearing loss, sneezing, congestion, runny nose or sore throat.  SKIN: No rash or itching.  CARDIOVASCULAR: No chest pain, chest pressure or chest discomfort. No palpitations or edema.  RESPIRATORY: No shortness of breath, cough or sputum.  GASTROINTESTINAL: No anorexia, nausea, vomiting or diarrhea. No abdominal pain or blood.  GENITOURINARY: no polyuria, no dysuria NEUROLOGICAL: No headache, dizziness, syncope, paralysis, ataxia, numbness or tingling in the extremities. No change in bowel or bladder control.  MUSCULOSKELETAL: No muscle, back pain, joint pain or stiffness.  HEMATOLOGIC: No anemia, bleeding or bruising.  LYMPHATICS: No enlarged nodes. No history of splenectomy.  PSYCHIATRIC: No history of depression or anxiety.      Physical Examination Blood pressure (!) 155/105, pulse (!) 123, temperature 98.6 F (37 C), temperature source Oral, resp. rate (!) 21, height 5\' 3"  (1.6 m), weight 218 lb 7.6 oz (99.1 kg), SpO2 91 %.  Intake/Output Summary (Last 24 hours) at 04/13/17 0921 Last data filed at 04/13/17 0856  Gross per 24  hour  Intake          2358.33 ml  Output              700 ml  Net          1658.33 ml    HEENT  Cardiovascular  Respiratory  GI  MSK  Neuro  Psych   Lab Results  Basic Metabolic Panel:  Recent Labs Lab 04/12/17 1858 04/13/17 0432  NA 143 140  K 2.8* 3.2*  CL 107 103  CO2 25 22  GLUCOSE 204* 235*  BUN 9 8  CREATININE 0.59 0.56  CALCIUM 10.0 9.1    Liver  Function Tests:  Recent Labs Lab 04/12/17 1858 04/13/17 0432  AST 332* 469*  ALT 263* 530*  ALKPHOS 94 104  BILITOT 2.3* 4.0*  PROT 7.9 7.5  ALBUMIN 4.6 4.3    CBC:  Recent Labs Lab 04/12/17 1834 04/12/17 1858 04/13/17 0432  WBC  --  14.7* 12.8*  NEUTROABS 10.8*  --   --   HGB  --  14.1 16.2*  HCT  --  42.9 49.2*  MCV  --  78.3 77.2*  PLT  --  391 364    Cardiac Enzymes: No results for input(s): CKTOTAL, CKMB, CKMBINDEX, TROPONINI in the last 168 hours.  BNP: Invalid input(s): POCBNP     Impression/;Recommendations 1. HTN/Tachcyardia - patient admitted with N/V in setting of pancreatitis. Continued 8/10 pain this morning.  - she is on Toprol XL at home. Denies any EtOH history - telemetry shows sinus tachycardia, no arrhythmias. N/V, she has had trouble keeping down her home beta blocker  - overall this looks to be a physiological response to her pancreatitis, driven by ongoing pain and inflammation. With her N/V has not been able to keep her oral beta blocker down. Would not be overally aggressive at suppressing her heart rates as this is a physiological resposne. We will stop oral metoprolol, start labetalol 10mg  IV q 8 hrs with hold parameters. No indication for additional cardiac testing at this time.   Carlyle Dolly, M.D.

## 2017-04-13 NOTE — ED Notes (Signed)
Pt returned from ct, requesting additional pain medication,

## 2017-04-13 NOTE — ED Provider Notes (Signed)
Patient signed out to me to follow-up on CT scan. Patient was seen this evening with complaints of upper abdominal pain that have been present through the course of the day. She has had nausea, vomiting with diarrhea. Blood work revealed significant elevation of lipase consistent with acute pancreatitis. She has a history of fatty liver, but no known history of pancreatitis. She denies alcohol intake. CT scan was performed and does show diffuse edematous changes of the pancreas consistent with pancreatitis. There is some pericholecystic fluid as well, but no obvious gall stone noted. No intrahepatic or extrahepatic ductal dilatation noted. Patient experiencing significant pain despite multiple doses of analgesia. She will require hospitalization for further management of acute pancreatitis of unclear etiology.  Results for orders placed or performed during the hospital encounter of 04/12/17  Lipase, blood  Result Value Ref Range   Lipase 6,774 (H) 11 - 51 U/L  Comprehensive metabolic panel  Result Value Ref Range   Sodium 143 135 - 145 mmol/L   Potassium 2.8 (L) 3.5 - 5.1 mmol/L   Chloride 107 101 - 111 mmol/L   CO2 25 22 - 32 mmol/L   Glucose, Bld 204 (H) 65 - 99 mg/dL   BUN 9 6 - 20 mg/dL   Creatinine, Ser 0.59 0.44 - 1.00 mg/dL   Calcium 10.0 8.9 - 10.3 mg/dL   Total Protein 7.9 6.5 - 8.1 g/dL   Albumin 4.6 3.5 - 5.0 g/dL   AST 332 (H) 15 - 41 U/L   ALT 263 (H) 14 - 54 U/L   Alkaline Phosphatase 94 38 - 126 U/L   Total Bilirubin 2.3 (H) 0.3 - 1.2 mg/dL   GFR calc non Af Amer >60 >60 mL/min   GFR calc Af Amer >60 >60 mL/min   Anion gap 11 5 - 15  CBC  Result Value Ref Range   WBC 14.7 (H) 4.0 - 10.5 K/uL   RBC 5.48 (H) 3.87 - 5.11 MIL/uL   Hemoglobin 14.1 12.0 - 15.0 g/dL   HCT 42.9 36.0 - 46.0 %   MCV 78.3 78.0 - 100.0 fL   MCH 25.7 (L) 26.0 - 34.0 pg   MCHC 32.9 30.0 - 36.0 g/dL   RDW 13.3 11.5 - 15.5 %   Platelets 391 150 - 400 K/uL  Differential  Result Value Ref Range    Neutrophils Relative % 74 %   Neutro Abs 10.8 (H) 1.7 - 7.7 K/uL   Lymphocytes Relative 14 %   Lymphs Abs 2.0 0.7 - 4.0 K/uL   Monocytes Relative 12 %   Monocytes Absolute 1.7 (H) 0.1 - 1.0 K/uL   Eosinophils Relative 0 %   Eosinophils Absolute 0.0 0.0 - 0.7 K/uL   Basophils Relative 0 %   Basophils Absolute 0.0 0.0 - 0.1 K/uL  hCG, serum, qualitative  Result Value Ref Range   Preg, Serum NEGATIVE NEGATIVE   Dg Chest 2 View  Result Date: 04/12/2017 CLINICAL DATA:  Patient c/o upper abd pain. Per patient nausea, vomiting w/ bright red blood, and diarrhea. Patient has generalized weakness. History of HTN, Breast mass. EXAM: CHEST  2 VIEW COMPARISON:  01/03/2016 FINDINGS: Cardiac silhouette is top-normal in size. No mediastinal or hilar masses. No evidence of adenopathy. Clear lungs.  No pleural effusion or pneumothorax. Skeletal structures are intact. IMPRESSION: No active cardiopulmonary disease. Electronically Signed   By: Lajean Manes M.D.   On: 04/12/2017 23:52   Ct Abdomen Pelvis W Contrast  Result Date: 04/13/2017 CLINICAL DATA:  Upper abdominal pain, nausea vomiting and diarrhea EXAM: CT ABDOMEN AND PELVIS WITH CONTRAST TECHNIQUE: Multidetector CT imaging of the abdomen and pelvis was performed using the standard protocol following bolus administration of intravenous contrast. CONTRAST:  170mL ISOVUE-300 IOPAMIDOL (ISOVUE-300) INJECTION 61% COMPARISON:  10/24/2013, mammogram 03/04/2017 FINDINGS: Lower chest: Lung bases demonstrate no acute consolidation or pleural effusion. Mild cardiomegaly. Partially visualized low-density lesion in the lateral aspect of left breast, likely corresponding to history of recent excision. Hepatobiliary: Mild steatosis. No biliary dilatation. Small amount of pericholecystic fluid. No calcified gallstones. Pancreas: Enlarged and edematous. Moderate-to-marked peripancreatic fluid and inflammatory change consistent with acute pancreatitis. No definite focal fluid  collections at the pancreas bed. Spleen: Normal in size without focal abnormality. Adrenals/Urinary Tract: Adrenal glands are unremarkable. Kidneys are normal, without renal calculi, focal lesion, or hydronephrosis. Bladder is unremarkable. Stomach/Bowel: Postsurgical changes of the stomach. Appendix appears normal. No evidence of bowel wall thickening, distention, or inflammatory changes. Vascular/Lymphatic: Normal portal vein and splenic vein enhancement. Non aneurysmal aorta. No significantly enlarged lymph nodes. Reproductive: Uterus and bilateral adnexa are unremarkable. Other: Small free fluid in the pelvis. Small to moderate fluid in the upper abdomen. No free air. Musculoskeletal: No acute or significant osseous findings. IMPRESSION: 1. Enlarged edematous pancreas with moderate-to-marked fluid and edema surrounding the pancreas consistent with acute pancreatitis. No focal or organized fluid collections are seen at this time. 2. Small amount of pericholecystic fluid, uncertain if this is related to generalized fluid in the upper abdomen from pancreatitis or if this is due to inflammation of the gallbladder. Suggest correlation with gallbladder ultrasound. 3. Small free fluid in the abdomen and pelvis. 4. Fatty liver 5. Focal hypodensity in the lateral aspect of the left breast, felt related to history of recent breast excision. Electronically Signed   By: Donavan Foil M.D.   On: 04/13/2017 00:20      Orpah Greek, MD 04/13/17 [phone]

## 2017-04-13 NOTE — Progress Notes (Signed)
eLink Physician-Brief Progress Note Patient Name: Kaylee Montgomery DOB: Feb 25, 1981 MRN: [id number]   Date of Service  04/13/2017  HPI/Events of Note  Patient admitted with acute pancreatitis. AST is significantly elevated. Mild leukocytosis. CT of the abdomen does show some pericholecystic fluid but no dilation of the common bile duct. Review of admission H&P indicates plan for hydration & pain control as well as right upper quadrant ultrasound. Patient does have hyperglycemia. No documented chronic alcohol abuse. Camera check shows patient resting recumbent in no acute distress.   eICU Interventions  Continuing current plan of care as per admitting service.      Intervention Category Evaluation Type: New Patient Evaluation  Tera Partridge 04/13/2017, 3:14 AM

## 2017-04-13 NOTE — Consult Note (Signed)
Referring Provider: Dr. Charlies Silvers  Primary Care Physician:  [name], MD Primary Gastroenterologist:  Dr. Oneida Alar   Date of Admission: 04/12/17 Date of Consultation: 04/13/17  Reason for Consultation:  Acute pancreatitis   HPI:  Kaylee Montgomery is a 36 y.o. year old female who presented to the ED with acute onset of upper abdominal pain, N/V, that started on Sunday around 1130am. Lipase was elevated greater than 6000 and improved this morning though still  elevated in the 1100 range. CT abd/pelvis with contrast showed enlarged edematous pancreas with moderate to marked fluid and edema consistent with acute pancreatitis. US abdomen with probable sludge within gallbladder, CBD 71mm diameter. Elevated transaminases on admission and increased with AST 469, ALT 530, Tbili 4.0. Leukocytosis improved from admission down from 14.7 to 12.8.   States she was [place] from [place] Sunday and developed acute onset of upper abdominal pain, N/V. Tried to take Tums, Gas-x, pepsi, to try to burp. No relief. Sought medical attention when symptoms did not improve. Medication is making her sleepy. Drowsy during consultation with her and does not appear in acute pain; however, her BP is consistently elevated both systolic and diastolic, and her heart rate remains in the 120s/130s even while sleeping. Cardiology evaluated patient this morning and felt that HTN/tachycardia was secondary to physiologic response to pancreatitis. Started on labetalol 10 mg IV every 8 hours.   She denies frequent ETOH use and states it is rare. Last glass of wine was in April 2018 per her report.   Past Medical History:  Diagnosis Date  . Breast mass 02/2017  . Dental crown present   . Hypertension    states BP fluctuates; has been on med. x 1 yr.  . Migraines   . Pituitary adenoma ([place])   . Seasonal allergies   . Sleep apnea    no CPAP use in > 6 mos.    Past Surgical History:  Procedure Laterality Date  . BREAST  LUMPECTOMY WITH RADIOACTIVE SEED LOCALIZATION Left 03/05/2017   Procedure: LEFT BREAST LUMPECTOMY WITH RADIOACTIVE SEED LOCALIZATION;  Surgeon: Donnie Mesa, MD;  Location: [facility];  Service: General;  Laterality: Left;  . CESAREAN SECTION    . LAPAROSCOPIC GASTRIC SLEEVE RESECTION N/A 02/18/2016   Procedure: LAPAROSCOPIC GASTRIC SLEEVE RESECTION, UPPER ENDO;  Surgeon: Greer Pickerel, MD;  Location: [facility];  Service: General;  Laterality: N/A;  . 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 TONSILLECTOMY  age 74    Prior to Admission medications   Medication Sig Start Date End Date Taking? Authorizing Provider  cyclobenzaprine (FLEXERIL) 5 MG tablet Take 1 tablet (5 mg total) by mouth at bedtime. 04/09/17  Yes Kathrynn Ducking, MD  gabapentin (NEURONTIN) 300 MG capsule Take 1 capsule (300 mg total) by mouth at bedtime. 02/20/16  Yes Greer Pickerel, MD  metoprolol succinate (TOPROL XL) 50 MG 24 hr tablet Take 1 tablet (50 mg total) by mouth daily. Take with or immediately following a meal. 11/18/16  Yes Kathrynn Ducking, MD    Current Facility-Administered Medications  Medication Dose Route Frequency Provider Last Rate Last Dose  . 0.9 %  sodium chloride infusion   Intravenous Continuous Orvan Falconer, MD 250 mL/hr at 04/13/17 1124    . acetaminophen (TYLENOL) tablet 650 mg  650 mg Oral Q6H PRN Robbie Lis, MD      . fentaNYL (SUBLIMAZE) injection 50 mcg  50 mcg Intravenous Q1H PRN Orvan Falconer, MD   50 mcg at 04/13/17 1235  . heparin injection 5,000 Units  5,000 Units Subcutaneous Q8H Orvan Falconer, MD   5,000 Units at 04/13/17 0600  . labetalol (NORMODYNE,TRANDATE) injection 10 mg  10 mg Intravenous Q8H Arnoldo Lenis, MD   10 mg at 04/13/17 1050  . labetalol (NORMODYNE,TRANDATE) injection 5 mg  5 mg Intravenous Q6H PRN Robbie Lis, MD   5 mg at 04/13/17 1157    Allergies as of 04/12/2017 - Review Complete  04/12/2017  Allergen Reaction Noted  . Bromocriptine mesylate Nausea Only 07/03/2008  . Depo-medrol [methylprednisolone acetate] Itching and Rash 01/29/2015  . Imitrex [sumatriptan] Rash 03/29/2015    Family History  Problem Relation Age of Onset  . Heart disease Father        stent  . Migraines Father   . Migraines Son   . Diabetes Maternal Grandfather     Social History   Social History  . Marital status: Single    Spouse name: N/A  . Number of children: 1  . Years of education: N/A   Occupational History  . STUDENT    . [employer]   Social History Main Topics  . Smoking status: Never Smoker  . Smokeless tobacco: Never Used  . Alcohol use 0.0 oz/week     Comment: occasionally  . Drug use: No  . Sexual activity: Not on file   Other Topics Concern  . Not on file   Social History Narrative   Patient is right handed.   Patient drinks 1-2 cups of caffeine daily.    Review of Systems: Gen: Denies fever, chills, loss of appetite, change in weight or weight loss CV: Denies chest pain, heart palpitations, syncope, edema  Resp: Denies shortness of breath with rest, cough, wheezing GI: see HPI  GU : Denies urinary burning, urinary frequency, urinary incontinence.  MS: Denies joint pain,swelling, cramping Derm: Denies rash, itching, dry skin Psych: Denies depression, anxiety,confusion, or memory loss Heme: Denies bruising, bleeding, and enlarged lymph nodes.  Physical Exam: Vital signs in last 24 hours: Temp:  [97 F (36.1 C)-99.1 F (37.3 C)] 99.1 F (37.3 C) (06/25 1153) Pulse Rate:  [94-142] 135 (06/25 1230) Resp:  [11-39] 24 (06/25 1230) BP: (141-186)/(89-131) 145/125 (06/25 1230) SpO2:  [91 %-100 %] 95 % (06/25 1230) Weight:  [218 lb 7.6 oz (99.1 kg)-219 lb (99.3 kg)] 218 lb 7.6 oz (99.1 kg) (06/25 0251)   General:  Drowsy but easily awakens. Alert and oriented X 4.  Head:  Normocephalic and atraumatic. Eyes:  Sclera clear, no  icterus.  Ears:  Normal auditory acuity. Nose:  No deformity, discharge,  or lesions. Mouth:  No deformity or lesions, dentition normal. Lungs:  Clear throughout to auscultation.   Heart:  Tachycardic Abdomen:  Soft, obese, hypoactive bowel sounds. TTP upper abdomen, fullness but without rebound or guarding.  Rectal:  Deferred    Msk:  Symmetrical without gross deformities. Normal posture. Extremities:  Without edema. Neurologic:  Alert and  oriented x4 Psych:   Normal mood and affect.  Intake/Output from previous day: 06/24 0701 - 06/25 0700 In: 1462.5 [I.V.:612.5; IV Piggyback:850] Out: 600 [Urine:600] Intake/Output this shift: Total I/O In: 895.8 [I.V.:795.8; IV Piggyback:100] Out: 200 [Urine:200]  Lab Results:  Recent Labs  04/12/17 1858 04/13/17 0432  WBC 14.7* 12.8*  HGB 14.1 16.2*  HCT 42.9 49.2*  PLT 391 364   BMET  Recent Labs  04/12/17 1858 04/13/17 0432  NA 143 140  K 2.8* 3.2*  CL 107 103  CO2 25 22  GLUCOSE 204* 235*  BUN 9 8  CREATININE 0.59 0.56  CALCIUM 10.0 9.1   LFT  Recent Labs  04/12/17 1858 04/13/17 0432  PROT 7.9 7.5  ALBUMIN 4.6 4.3  AST 332* 469*  ALT 263* 530*  ALKPHOS 94 104  BILITOT 2.3* 4.0*    Studies/Results: Dg Chest 2 View  Result Date: 04/12/2017 CLINICAL DATA:  Patient c/o upper abd pain. Per patient nausea, vomiting w/ bright red blood, and diarrhea. Patient has generalized weakness. History of HTN, Breast mass. EXAM: CHEST  2 VIEW COMPARISON:  01/03/2016 FINDINGS: Cardiac silhouette is top-normal in size. No mediastinal or hilar masses. No evidence of adenopathy. Clear lungs.  No pleural effusion or pneumothorax. Skeletal structures are intact. IMPRESSION: No active cardiopulmonary disease. Electronically Signed   By: Lajean Manes M.D.   On: 04/12/2017 23:52   US Abdomen Complete  Result Date: 04/13/2017 CLINICAL DATA:  Pancreatitis, nausea, vomiting, diarrhea, breast mass EXAM: ABDOMEN ULTRASOUND COMPLETE  COMPARISON:  CT abdomen pelvis 04/12/2017 FINDINGS: Gallbladder: Dependent nonshadowing echogenic material within gallbladder lumen question sludge. Gallbladder wall upper normal thickness 3 mm diameter. No sonographic Murphy sign or definite shadowing calculi. Common bile duct: Diameter: 3 mm diameter , normal Liver: Echogenic, likely fatty infiltration, though this can be seen with cirrhosis and certain infiltrative disorders. Area of focally decreased attenuation within liver adjacent to gallbladder fossa 3.4 x 2.9 x 3.5 cm question fatty sparing versus hypoechoic mass; no definite mass lesion seen at this site on prior CT, favor sparing. IVC: Short segment of intrahepatic IVC normal appearance, intrahepatic portion obscured by bowel gas Pancreas: Thickened and hypoechoic consistent with pancreatitis features seen on CT. No discrete mass, pseudocyst, or ductal dilatation. No shadowing calcification. Portions of the tail and uncinate inner obscured by bowel gas. Spleen: Normal appearance, 5.7 cm length Right Kidney: Length: 11.1 cm. Normal morphology without mass or hydronephrosis. Left Kidney: Length: 11.8 cm. Normal morphology without mass or hydronephrosis. Abdominal aorta: Normal caliber Other findings: No free fluid IMPRESSION: Enlarged pancreas consistent with pancreatitis features seen on CT. Incomplete visualization of distal pancreatic tail and portions of uncinate. Probable sludge within gallbladder with upper normal gallbladder wall thickness. No definite gallstones or sonographic Murphy sign. Hypoechoic region within liver 3.5 cm greatest size, question focal sparing within a background of fatty infiltration ; no definite hepatic mass was seen at this site on the preceding CT. Electronically Signed   By: Lavonia Dana M.D.   On: 04/13/2017 10:14   Ct Abdomen Pelvis W Contrast  Result Date: 04/13/2017 CLINICAL DATA:  Upper abdominal pain, nausea vomiting and diarrhea EXAM: CT ABDOMEN AND PELVIS WITH  CONTRAST TECHNIQUE: Multidetector CT imaging of the abdomen and pelvis was performed using the standard protocol following bolus administration of intravenous contrast. CONTRAST:  143mL ISOVUE-300 IOPAMIDOL (ISOVUE-300) INJECTION 61% COMPARISON:  10/24/2013, mammogram 03/04/2017 FINDINGS: Lower chest: Lung bases demonstrate no acute consolidation or pleural effusion. Mild cardiomegaly. Partially visualized low-density lesion in the lateral aspect of left breast, likely corresponding to history of recent excision. Hepatobiliary: Mild steatosis. No biliary dilatation. Small amount of pericholecystic fluid. No calcified gallstones. Pancreas: Enlarged and edematous. Moderate-to-marked peripancreatic fluid and inflammatory change consistent with acute pancreatitis. No definite focal fluid collections at the pancreas bed. Spleen: Normal in size without focal abnormality. Adrenals/Urinary Tract: Adrenal glands are unremarkable. Kidneys are normal, without renal calculi, focal lesion,  or hydronephrosis. Bladder is unremarkable. Stomach/Bowel: Postsurgical changes of the stomach. Appendix appears normal. No evidence of bowel wall thickening, distention, or inflammatory changes. Vascular/Lymphatic: Normal portal vein and splenic vein enhancement. Non aneurysmal aorta. No significantly enlarged lymph nodes. Reproductive: Uterus and bilateral adnexa are unremarkable. Other: Small free fluid in the pelvis. Small to moderate fluid in the upper abdomen. No free air. Musculoskeletal: No acute or significant osseous findings. IMPRESSION: 1. Enlarged edematous pancreas with moderate-to-marked fluid and edema surrounding the pancreas consistent with acute pancreatitis. No focal or organized fluid collections are seen at this time. 2. Small amount of pericholecystic fluid, uncertain if this is related to generalized fluid in the upper abdomen from pancreatitis or if this is due to inflammation of the gallbladder. Suggest correlation  with gallbladder ultrasound. 3. Small free fluid in the abdomen and pelvis. 4. Fatty liver 5. Focal hypodensity in the lateral aspect of the left breast, felt related to history of recent breast excision. Electronically Signed   By: Donavan Foil M.D.   On: 04/13/2017 00:20    Impression: 36 year old female presenting with acute abdominal pain, N/V, and found to have CT documented pancreatitis, lipase greater than 6000, and elevated transaminases/bilirubin. Suspect biliary etiology. Denies routine alcohol and states her last glass of wine was in April 2018, only drinking on social occasions. CT with normal CBD, US abdomen with probable sludge within gallbladder. Pain control has been an issue, although she continued to fall asleep during the consultation. Persistent tachycardia and hypertension noted with cardiology following and felt to be a physiologic response to acute illness; she had been unable to tolerate Toprol XL at home due to N/V. Labetalol has been started. NS IV fluids currently running at 250 ml/hour at time of consultation. No significant cardiac or pulmonary history, so this has been increased to 300 ml/hour for now. Check HFP, CBC, lipase, BMP in the morning and continue supportive measures.   Plan: Increase NS to 3100ml/hour Repeat CBC, lipase, CMP in am NPO for now Will continue to follow closely with you  Annitta Needs, PhD, ANP-BC [facility]      LOS: 0 days    04/13/2017, 1:49 PM

## 2017-04-13 NOTE — Progress Notes (Signed)
Patient admitted after midnight. For details please refer to admission note done 04/13/2017. Patient admitted with abdominal pain, nausea and vomiting, found to have severe acute pancreatitis. She is being monitor in step down unit. Continue IV fluids, analgesia and antiemetics as needed. Lipase has trended down from 6774 down to 1167. GI consulted. Another issue is persistent tachycardia and uncontrolled blood pressure for which reason cardiology consulted. Leisa Lenz [facility] [phone]

## 2017-04-13 NOTE — H&P (Signed)
History and Physical    Kaylee Montgomery UXL:[id number] DOB: 05-11-1981 DOA: 04/12/2017  PCP: Kerri Perches, MD  Patient coming from: Home.   Chief Complaint:   Abdominal pain, nausea and vomiting.   HPI: Kaylee Montgomery is an 36 y.o. female with hx of morbid obesity, hx of breast lump removal, s/p sleeve surgery, HTN, Pituary tumor, migraine, OSA not CPAP compliance, drank 1 cup of wine about a week ago, very rare alcohol use, presented to the ER with epigastric pain, nausea, and vomiting.  She was found to have Lipase of 6K, and abdominal CT confirmed acute pancreatitis with no pseudocyst, dilated CBD, or gallstone.  No abscess.  She has leukocytosis with WBC of 14K, normal Hb, normal Cr and K of 2.8.  She was given IVF, IV narcotics, and hospitalist was asked to admit for for acute pancreatitis.   Her Calcium is 10 mg per dL.  This is her first episode of acute pancreatitis.  No new meds.    ED Course:  See above.  Rewiew of Systems:  Constitutional: Negative for malaise, fever and chills. No significant weight loss or weight gain Eyes: Negative for eye pain, redness and discharge, diplopia, visual changes, or flashes of light. ENMT: Negative for ear pain, hoarseness, nasal congestion, sinus pressure and sore throat. No headaches; tinnitus, drooling, or problem swallowing. Cardiovascular: Negative for chest pain, palpitations, diaphoresis, dyspnea and peripheral edema. ; No orthopnea, PND Respiratory: Negative for cough, hemoptysis, wheezing and stridor. No pleuritic chestpain. Gastrointestinal: Negative for diarrhea, constipation,  melena, blood in stool, hematemesis, jaundice and rectal bleeding.    Genitourinary: Negative for frequency, dysuria, incontinence,flank pain and hematuria; Musculoskeletal: Negative for back pain and neck pain. Negative for swelling and trauma.;  Skin: . Negative for pruritus, rash, abrasions, bruising and skin lesion.; ulcerations Neuro: Negative for  headache, lightheadedness and neck stiffness. Negative for weakness, altered level of consciousness , altered mental status, extremity weakness, burning feet, involuntary movement, seizure and syncope.  Psych: negative for anxiety, depression, insomnia, tearfulness, panic attacks, hallucinations, paranoia, suicidal or homicidal ideation    Past Medical History:  Diagnosis Date  . Breast mass 02/2017  . Dental crown present   . Hypertension    states BP fluctuates; has been on med. x 1 yr.  . Migraines   . Pituitary adenoma (HCC)   . Seasonal allergies   . Sleep apnea    no CPAP use in > 6 mos.    Past Surgical History:  Procedure Laterality Date  . BREAST LUMPECTOMY WITH RADIOACTIVE SEED LOCALIZATION Left 03/05/2017   Procedure: LEFT BREAST LUMPECTOMY WITH RADIOACTIVE SEED LOCALIZATION;  Surgeon: Manus Rudd, MD;  Location: [facility];  Service: General;  Laterality: Left;  . CESAREAN SECTION    . LAPAROSCOPIC GASTRIC SLEEVE RESECTION N/A 02/18/2016   Procedure: LAPAROSCOPIC GASTRIC SLEEVE RESECTION, UPPER ENDO;  Surgeon: Gaynelle Adu, MD;  Location: [facility];  Service: General;  Laterality: N/A;  . 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 TONSILLECTOMY  age 18     reports that she has never smoked. She has never used smokeless tobacco. She reports that she drinks alcohol. She reports that she does not use drugs.  Allergies  Allergen Reactions  . Bromocriptine Mesylate Nausea Only  . Depo-Medrol [Methylprednisolone Acetate] Itching and Rash  . Imitrex [Sumatriptan] Rash    Family History  Problem Relation Age of Onset  . Heart disease  Father        stent  . Migraines Father   . Migraines Son   . Diabetes Maternal Grandfather      Prior to Admission medications   Medication Sig Start Date End Date Taking? Authorizing Provider  cyclobenzaprine (FLEXERIL) 5 MG tablet Take 1 tablet (5 mg  total) by mouth at bedtime. 04/09/17   York Spaniel, MD  gabapentin (NEURONTIN) 300 MG capsule Take 1 capsule (300 mg total) by mouth at bedtime. 02/20/16   Gaynelle Adu, MD  gabapentin (NEURONTIN) 300 MG capsule TAKE 1 CAPSULE BY MOUTH 2 TIMES DAILY. 03/06/17   York Spaniel, MD  HYDROcodone-acetaminophen (NORCO/VICODIN) 5-325 MG tablet Take 1-2 tablets by mouth every 6 (six) hours as needed for moderate pain. 03/05/17   Manus Rudd, MD  metoprolol succinate (TOPROL XL) 50 MG 24 hr tablet Take 1 tablet (50 mg total) by mouth daily. Take with or immediately following a meal. 11/18/16   York Spaniel, MD    Physical Exam: Vitals:   04/12/17 2310 04/12/17 2330 04/13/17 0030 04/13/17 0110  BP: (!) 168/107 (!) 165/100 (!) 175/113 (!) 178/122  Pulse: 99 (!) 101 (!) 102 (!) 108  Resp: 17 18 18 17   Temp:      TempSrc:      SpO2: 97% 98% 100% 98%  Weight:      Height:          Constitutional: NAD, calm, comfortable Vitals:   04/12/17 2310 04/12/17 2330 04/13/17 0030 04/13/17 0110  BP: (!) 168/107 (!) 165/100 (!) 175/113 (!) 178/122  Pulse: 99 (!) 101 (!) 102 (!) 108  Resp: 17 18 18 17   Temp:      TempSrc:      SpO2: 97% 98% 100% 98%  Weight:      Height:       Eyes: PERRL, lids and conjunctivae normal ENMT: Mucous membranes are moist. Posterior pharynx clear of any exudate or lesions.Normal dentition.  Neck: normal, supple, no masses, no thyromegaly Respiratory: clear to auscultation bilaterally, no wheezing, no crackles. Normal respiratory effort. No accessory muscle use.  Cardiovascular: Regular rate and rhythm, no murmurs / rubs / gallops. No extremity edema. 2+ pedal pulses. No carotid bruits.  Abdomen: tenderness over the epigastric area.  no masses palpated. No hepatosplenomegaly. Bowel sounds positive.  Musculoskeletal: no clubbing / cyanosis. No joint deformity upper and lower extremities. Good ROM, no contractures. Normal muscle tone.  Skin: no rashes, lesions,  ulcers. No induration Neurologic: CN 2-12 grossly intact. Sensation intact, DTR normal. Strength 5/5 in all 4.  Psychiatric: Normal judgment and insight. Alert and oriented x 3. Normal mood.     Labs on Admission: I have personally reviewed following labs and imaging studies  CBC:  Recent Labs Lab 04/12/17 1834 04/12/17 1858  WBC  --  14.7*  NEUTROABS 10.8*  --   HGB  --  14.1  HCT  --  42.9  MCV  --  78.3  PLT  --  391   Basic Metabolic Panel:  Recent Labs Lab 04/12/17 1858  NA 143  K 2.8*  CL 107  CO2 25  GLUCOSE 204*  BUN 9  CREATININE 0.59  CALCIUM 10.0   GFR: Estimated Creatinine Clearance: 108.2 mL/min (by C-G formula based on SCr of 0.59 mg/dL). Liver Function Tests:  Recent Labs Lab 04/12/17 1858  AST 332*  ALT 263*  ALKPHOS 94  BILITOT 2.3*  PROT 7.9  ALBUMIN 4.6    Recent  Labs Lab 04/12/17 1858  LIPASE 6,774*   Urine analysis:    Component Value Date/Time   COLORURINE STRAW (A) 10/21/2013 0035   APPEARANCEUR CLEAR 10/21/2013 0035   LABSPEC <1.005 (L) 10/21/2013 0035   PHURINE 6.0 10/21/2013 0035   GLUCOSEU NEGATIVE 10/21/2013 0035   HGBUR TRACE (A) 10/21/2013 0035   BILIRUBINUR neg 10/24/2013 1255   KETONESUR NEGATIVE 10/21/2013 0035   PROTEINUR 30mg /dl 47/42/5956 3875   PROTEINUR NEGATIVE 10/21/2013 0035   UROBILINOGEN 1.0 10/24/2013 1255   UROBILINOGEN 0.2 10/21/2013 0035   NITRITE neg 10/24/2013 1255   NITRITE NEGATIVE 10/21/2013 0035   LEUKOCYTESUR Negative 10/24/2013 1255   Radiological Exams on Admission: Dg Chest 2 View  Result Date: 04/12/2017 CLINICAL DATA:  Patient c/o upper abd pain. Per patient nausea, vomiting w/ bright red blood, and diarrhea. Patient has generalized weakness. History of HTN, Breast mass. EXAM: CHEST  2 VIEW COMPARISON:  01/03/2016 FINDINGS: Cardiac silhouette is top-normal in size. No mediastinal or hilar masses. No evidence of adenopathy. Clear lungs.  No pleural effusion or pneumothorax. Skeletal  structures are intact. IMPRESSION: No active cardiopulmonary disease. Electronically Signed   By: Amie Portland M.D.   On: 04/12/2017 23:52   Ct Abdomen Pelvis W Contrast  Result Date: 04/13/2017 CLINICAL DATA:  Upper abdominal pain, nausea vomiting and diarrhea EXAM: CT ABDOMEN AND PELVIS WITH CONTRAST TECHNIQUE: Multidetector CT imaging of the abdomen and pelvis was performed using the standard protocol following bolus administration of intravenous contrast. CONTRAST:  ISOVUE-300 IOPAMIDOL (ISOVUE-300) INJECTION 61% COMPARISON:  10/24/2013, mammogram 03/04/2017 FINDINGS: Lower chest: Lung bases demonstrate no acute consolidation or pleural effusion. Mild cardiomegaly. Partially visualized low-density lesion in the lateral aspect of left breast, likely corresponding to history of recent excision. Hepatobiliary: Mild steatosis. No biliary dilatation. Small amount of pericholecystic fluid. No calcified gallstones. Pancreas: Enlarged and edematous. Moderate-to-marked peripancreatic fluid and inflammatory change consistent with acute pancreatitis. No definite focal fluid collections at the pancreas bed. Spleen: Normal in size without focal abnormality. Adrenals/Urinary Tract: Adrenal glands are unremarkable. Kidneys are normal, without renal calculi, focal lesion, or hydronephrosis. Bladder is unremarkable. Stomach/Bowel: Postsurgical changes of the stomach. Appendix appears normal. No evidence of bowel wall thickening, distention, or inflammatory changes. Vascular/Lymphatic: Normal portal vein and splenic vein enhancement. Non aneurysmal aorta. No significantly enlarged lymph nodes. Reproductive: Uterus and bilateral adnexa are unremarkable. Other: Small free fluid in the pelvis. Small to moderate fluid in the upper abdomen. No free air. Musculoskeletal: No acute or significant osseous findings. IMPRESSION: 1. Enlarged edematous pancreas with moderate-to-marked fluid and edema surrounding the pancreas  consistent with acute pancreatitis. No focal or organized fluid collections are seen at this time. 2. Small amount of pericholecystic fluid, uncertain if this is related to generalized fluid in the upper abdomen from pancreatitis or if this is due to inflammation of the gallbladder. Suggest correlation with gallbladder ultrasound. 3. Small free fluid in the abdomen and pelvis. 4. Fatty liver 5. Focal hypodensity in the lateral aspect of the left breast, felt related to history of recent breast excision. Electronically Signed   By: Jasmine Pang M.D.   On: 04/13/2017 00:20    EKG: Independently reviewed.   Assessment/Plan Principal Problem:   Acute pancreatitis Active Problems:   PITUITARY ADENOMA   Morbid obesity (HCC)   Seasonal and perennial allergic rhinitis   Prediabetes   Obstructive sleep apnea   S/P laparoscopic sleeve gastrectomy    PLAN:   Acute severe pancreatitis:  Will admit her  to the ICU.  Give aggressive IVF recusitation at 250 cc per hour.  Give IV Fentanyl for pain, make NPO, and give anti emetic.  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  Will obtain GGT and obtain RUQ [place].  Consider GI consultation tomorrow.    Morbid obesity:  Noted.    OSA:  She doesn't wear CPAP machine.   S/P Lap sleeve gastrectomy:  Stable.    DVT prophylaxis: SubQ heparin.  Code Status: FULL CODE.  Family Communication: grand mother at bedside.  Disposition Plan: Home.  Consults called: None. Admission status: Inpatient.    Ameya Vowell MD FACP. [facility]  If 7PM-7AM, please contact night-coverage [email or web address] Password [email or web address]  04/13/2017, 2:03 AM

## 2017-04-14 ENCOUNTER — Inpatient Hospital Stay (HOSPITAL_COMMUNITY): Payer: 59

## 2017-04-14 DIAGNOSIS — R740 Nonspecific elevation of levels of transaminase and lactic acid dehydrogenase [LDH]: Secondary | ICD-10-CM

## 2017-04-14 DIAGNOSIS — R7401 Elevation of levels of liver transaminase levels: Secondary | ICD-10-CM

## 2017-04-14 DIAGNOSIS — Z9884 Bariatric surgery status: Secondary | ICD-10-CM

## 2017-04-14 DIAGNOSIS — K8500 Idiopathic acute pancreatitis without necrosis or infection: Secondary | ICD-10-CM

## 2017-04-14 LAB — COMPREHENSIVE METABOLIC PANEL
ALT: 277 U/L — ABNORMAL HIGH (ref 14–54)
AST: 123 U/L — ABNORMAL HIGH (ref 15–41)
Albumin: 2.9 g/dL — ABNORMAL LOW (ref 3.5–5.0)
Alkaline Phosphatase: 74 U/L (ref 38–126)
Anion gap: 9 (ref 5–15)
BUN: 15 mg/dL (ref 6–20)
CO2: 20 mmol/L — ABNORMAL LOW (ref 22–32)
Calcium: 8.2 mg/dL — ABNORMAL LOW (ref 8.9–10.3)
Chloride: 110 mmol/L (ref 101–111)
Creatinine, Ser: 0.85 mg/dL (ref 0.44–1.00)
GFR calc Af Amer: >60 mL/min (ref >60)
GFR calc non Af Amer: >60 mL/min (ref >60)
Glucose, Bld: 261 mg/dL — ABNORMAL HIGH (ref 65–99)
Potassium: 4.5 mmol/L (ref 3.5–5.1)
Sodium: 139 mmol/L (ref 135–145)
Total Bilirubin: 2.3 mg/dL — ABNORMAL HIGH (ref 0.3–1.2)
Total Protein: 5.7 g/dL — ABNORMAL LOW (ref 6.5–8.1)

## 2017-04-14 LAB — LACTATE DEHYDROGENASE: LDH: 362 U/L — ABNORMAL HIGH (ref 98–192)

## 2017-04-14 LAB — CBC
HCT: 46.5 % — ABNORMAL HIGH (ref 36.0–46.0)
Hemoglobin: 15 g/dL (ref 12.0–15.0)
MCH: 26 pg (ref 26.0–34.0)
MCHC: 32.3 g/dL (ref 30.0–36.0)
MCV: 80.4 fL (ref 78.0–100.0)
Platelets: 248 10*3/uL (ref 150–400)
RBC: 5.78 MIL/uL — ABNORMAL HIGH (ref 3.87–5.11)
RDW: 14.2 % (ref 11.5–15.5)
WBC: 16.2 10*3/uL — ABNORMAL HIGH (ref 4.0–10.5)

## 2017-04-14 LAB — GLUCOSE, CAPILLARY
Glucose-Capillary: 229 mg/dL — ABNORMAL HIGH (ref 65–99)
Glucose-Capillary: 177 mg/dL — ABNORMAL HIGH (ref 65–99)

## 2017-04-14 LAB — PROCALCITONIN: Procalcitonin: 2.4 ng/mL

## 2017-04-14 LAB — LIPASE, BLOOD: Lipase: 977 U/L — ABNORMAL HIGH (ref 11–51)

## 2017-04-14 LAB — LACTIC ACID, PLASMA: Lactic Acid, Venous: 3.6 mmol/L (ref 0.5–1.9)

## 2017-04-14 MED ORDER — LABETALOL HCL 5 MG/ML IV SOLN
15.0000 mg | Freq: Three times a day (TID) | INTRAVENOUS | Status: DC
  Administered 2017-04-14 – 2017-04-16 (×7): 15 mg via INTRAVENOUS
  Filled 2017-04-14 (×7): qty 4

## 2017-04-14 MED ORDER — PIPERACILLIN-TAZOBACTAM 3.375 G IVPB
3.3750 g | Freq: Three times a day (TID) | INTRAVENOUS | Status: DC
  Administered 2017-04-14 – 2017-04-15 (×4): 3.375 g via INTRAVENOUS
  Filled 2017-04-14 (×4): qty 50

## 2017-04-14 MED ORDER — HYDROMORPHONE HCL 1 MG/ML IJ SOLN
1.0000 mg | INTRAMUSCULAR | Status: DC | PRN
  Administered 2017-04-14 – 2017-04-15 (×3): 1 mg via INTRAVENOUS
  Filled 2017-04-14 (×3): qty 1

## 2017-04-14 MED ORDER — SODIUM CHLORIDE 0.9 % IV BOLUS (SEPSIS)
1000.0000 mL | Freq: Once | INTRAVENOUS | Status: AC
  Administered 2017-04-14: 1000 mL via INTRAVENOUS

## 2017-04-14 MED ORDER — HEPARIN SODIUM (PORCINE) 5000 UNIT/ML IJ SOLN
7500.0000 [IU] | Freq: Three times a day (TID) | INTRAMUSCULAR | Status: DC
  Administered 2017-04-14 – 2017-04-18 (×13): 7500 [IU] via SUBCUTANEOUS
  Filled 2017-04-14 (×14): qty 2

## 2017-04-14 MED ORDER — INSULIN ASPART 100 UNIT/ML ~~LOC~~ SOLN: 0.0000 [IU] | Freq: Three times a day (TID) | SUBCUTANEOUS | Status: DC

## 2017-04-14 MED ORDER — LACTATED RINGERS IV SOLN
INTRAVENOUS | Status: DC
  Administered 2017-04-14 – 2017-04-19 (×9): via INTRAVENOUS

## 2017-04-14 MED ORDER — ORAL CARE MOUTH RINSE
15.0000 mL | Freq: Two times a day (BID) | OROMUCOSAL | Status: DC
  Administered 2017-04-14 – 2017-04-27 (×20): 15 mL via OROMUCOSAL

## 2017-04-14 MED ORDER — KCL-LACTATED RINGERS-D5W 20 MEQ/L IV SOLN
INTRAVENOUS | Status: DC
  Administered 2017-04-14 (×4): via INTRAVENOUS
  Filled 2017-04-14 (×9): qty 1000
  Filled 2017-04-14: qty 2000
  Filled 2017-04-14: qty 1000

## 2017-04-14 MED ORDER — CHLORHEXIDINE GLUCONATE 0.12 % MT SOLN
15.0000 mL | Freq: Two times a day (BID) | OROMUCOSAL | Status: DC
  Administered 2017-04-14 – 2017-04-29 (×29): 15 mL via OROMUCOSAL
  Filled 2017-04-14 (×25): qty 15

## 2017-04-14 MED ORDER — METOPROLOL TARTRATE 5 MG/5ML IV SOLN
5.0000 mg | Freq: Once | INTRAVENOUS | Status: AC
  Administered 2017-04-14: 5 mg via INTRAVENOUS
  Filled 2017-04-14: qty 5

## 2017-04-14 MED ORDER — INSULIN ASPART 100 UNIT/ML ~~LOC~~ SOLN
0.0000 [IU] | SUBCUTANEOUS | Status: DC
  Administered 2017-04-14: 3 [IU] via SUBCUTANEOUS
  Administered 2017-04-14: 2 [IU] via SUBCUTANEOUS
  Administered 2017-04-15 (×4): 1 [IU] via SUBCUTANEOUS

## 2017-04-14 MED ORDER — HYDROMORPHONE HCL 1 MG/ML IJ SOLN
1.0000 mg | Freq: Once | INTRAMUSCULAR | Status: AC
  Administered 2017-04-14: 1 mg via INTRAVENOUS
  Filled 2017-04-14: qty 1

## 2017-04-14 MED ORDER — ALPRAZOLAM 0.25 MG PO TABS
0.2500 mg | ORAL_TABLET | Freq: Two times a day (BID) | ORAL | Status: DC
  Administered 2017-04-14 – 2017-04-16 (×7): 0.25 mg via ORAL
  Filled 2017-04-14 (×7): qty 1

## 2017-04-14 NOTE — Progress Notes (Addendum)
Patient with sinus tachycardia, the treatment for sinus tachycardia is to treat the underlying cause. In her case treatment of the underlying pancreatitis and associated pain, N/V, and volume resuscitation.  Can work toward mild rate control but there is no role for aggressive rate control. Will increase labetalol to 15mg  IV q 8. We will continue to follow heart rates and bp's peripherally, no indication for further cardiac testing at this time.    Carlyle Dolly MD

## 2017-04-14 NOTE — Progress Notes (Addendum)
PICC NURSE TO ARRIVE AROUND 1900.PT NOTIFIED.DR DEVINE ALSO NOTIFIED.

## 2017-04-14 NOTE — Progress Notes (Signed)
Subjective: Nurse at bedside. Patient alert. States pain is more controlled with new regimen and scheduled Zofran. Nausea resolved. Notes hiccups and need to belch; requesting something to drink. Reports feeling very anxious, stating she has chronic anxiety and is bothered that she is unable to write anything down. States thinking about wanting to drink causes anxiety as well. Heart rate 158 while in room.   Objective: Vital signs in last 24 hours: Temp:  [98 F (36.7 C)-99.8 F (37.7 C)] 99.8 F (37.7 C) (06/26 0400) Pulse Rate:  [122-158] 131 (06/26 0600) Resp:  [21-45] 34 (06/26 0600) BP: (107-177)/(78-125) 140/78 (06/26 0600) SpO2:  [89 %-96 %] 96 % (06/26 0600)   General:   Alert and oriented, anxious.  Head:  Normocephalic and atraumatic. Eyes:  No icterus, sclera clear. Conjuctiva pink.  Heart:  S1, S2 present, tachycardic Lungs: Clear to auscultation bilaterally, tachypneic but unlabored Abdomen:  Hypoactive bowel sounds, distended. Tenderness with palpation upper abdomen Msk:  Symmetrical without gross deformities. Normal posture. Extremities:  Without edema. Neurologic:  Alert and  oriented x4 Psych:  Alert and cooperative. Normal mood and affect.  Intake/Output from previous day: 06/25 0701 - 06/26 0700 In: 4829.2 [I.V.:3529.2; IV Piggyback:1300] Out: 450 [Urine:450] Intake/Output this shift: No intake/output data recorded.  Lab Results:  Recent Labs  04/12/17 1858 04/13/17 0432 04/14/17 0429  WBC 14.7* 12.8* 16.2*  HGB 14.1 16.2* 15.0  HCT 42.9 49.2* 46.5*  PLT 391 364 248   BMET  Recent Labs  04/12/17 1858 04/13/17 0432 04/14/17 0429  NA 143 140 139  K 2.8* 3.2* 4.5  CL 107 103 110  CO2 25 22 20*  GLUCOSE 204* 235* 261*  BUN 9 8 15   CREATININE 0.59 0.56 0.85  CALCIUM 10.0 9.1 8.2*   LFT  Recent Labs  04/12/17 1858 04/13/17 0432 04/13/17 1619 04/14/17 0429  PROT 7.9 7.5  --  5.7*  ALBUMIN 4.6 4.3  --  2.9*  AST 332* 469*  --   123*  ALT 263* 530*  --  277*  ALKPHOS 94 104  --  74  BILITOT 2.3* 4.0*  --  2.3*  BILIDIR  --   --  1.2*  --     Studies/Results: Dg Chest 2 View  Result Date: 04/12/2017 CLINICAL DATA:  Patient c/o upper abd pain. Per patient nausea, vomiting w/ bright red blood, and diarrhea. Patient has generalized weakness. History of HTN, Breast mass. EXAM: CHEST  2 VIEW COMPARISON:  01/03/2016 FINDINGS: Cardiac silhouette is top-normal in size. No mediastinal or hilar masses. No evidence of adenopathy. Clear lungs.  No pleural effusion or pneumothorax. Skeletal structures are intact. IMPRESSION: No active cardiopulmonary disease. Electronically Signed   By: Lajean Manes M.D.   On: 04/12/2017 23:52   US Abdomen Complete  Result Date: 04/13/2017 CLINICAL DATA:  Pancreatitis, nausea, vomiting, diarrhea, breast mass EXAM: ABDOMEN ULTRASOUND COMPLETE COMPARISON:  CT abdomen pelvis 04/12/2017 FINDINGS: Gallbladder: Dependent nonshadowing echogenic material within gallbladder lumen question sludge. Gallbladder wall upper normal thickness 3 mm diameter. No sonographic Murphy sign or definite shadowing calculi. Common bile duct: Diameter: 3 mm diameter , normal Liver: Echogenic, likely fatty infiltration, though this can be seen with cirrhosis and certain infiltrative disorders. Area of focally decreased attenuation within liver adjacent to gallbladder fossa 3.4 x 2.9 x 3.5 cm question fatty sparing versus hypoechoic mass; no definite mass lesion seen at this site on prior CT, favor sparing. IVC: Short segment of intrahepatic IVC normal appearance,  intrahepatic portion obscured by bowel gas Pancreas: Thickened and hypoechoic consistent with pancreatitis features seen on CT. No discrete mass, pseudocyst, or ductal dilatation. No shadowing calcification. Portions of the tail and uncinate inner obscured by bowel gas. Spleen: Normal appearance, 5.7 cm length Right Kidney: Length: 11.1 cm. Normal morphology without mass or  hydronephrosis. Left Kidney: Length: 11.8 cm. Normal morphology without mass or hydronephrosis. Abdominal aorta: Normal caliber Other findings: No free fluid IMPRESSION: Enlarged pancreas consistent with pancreatitis features seen on CT. Incomplete visualization of distal pancreatic tail and portions of uncinate. Probable sludge within gallbladder with upper normal gallbladder wall thickness. No definite gallstones or sonographic Murphy sign. Hypoechoic region within liver 3.5 cm greatest size, question focal sparing within a background of fatty infiltration ; no definite hepatic mass was seen at this site on the preceding CT. Electronically Signed   By: Lavonia Dana M.D.   On: 04/13/2017 10:14   Ct Abdomen Pelvis W Contrast  Result Date: 04/13/2017 CLINICAL DATA:  Upper abdominal pain, nausea vomiting and diarrhea EXAM: CT ABDOMEN AND PELVIS WITH CONTRAST TECHNIQUE: Multidetector CT imaging of the abdomen and pelvis was performed using the standard protocol following bolus administration of intravenous contrast. CONTRAST:  161mL ISOVUE-300 IOPAMIDOL (ISOVUE-300) INJECTION 61% COMPARISON:  10/24/2013, mammogram 03/04/2017 FINDINGS: Lower chest: Lung bases demonstrate no acute consolidation or pleural effusion. Mild cardiomegaly. Partially visualized low-density lesion in the lateral aspect of left breast, likely corresponding to history of recent excision. Hepatobiliary: Mild steatosis. No biliary dilatation. Small amount of pericholecystic fluid. No calcified gallstones. Pancreas: Enlarged and edematous. Moderate-to-marked peripancreatic fluid and inflammatory change consistent with acute pancreatitis. No definite focal fluid collections at the pancreas bed. Spleen: Normal in size without focal abnormality. Adrenals/Urinary Tract: Adrenal glands are unremarkable. Kidneys are normal, without renal calculi, focal lesion, or hydronephrosis. Bladder is unremarkable. Stomach/Bowel: Postsurgical changes of the  stomach. Appendix appears normal. No evidence of bowel wall thickening, distention, or inflammatory changes. Vascular/Lymphatic: Normal portal vein and splenic vein enhancement. Non aneurysmal aorta. No significantly enlarged lymph nodes. Reproductive: Uterus and bilateral adnexa are unremarkable. Other: Small free fluid in the pelvis. Small to moderate fluid in the upper abdomen. No free air. Musculoskeletal: No acute or significant osseous findings. IMPRESSION: 1. Enlarged edematous pancreas with moderate-to-marked fluid and edema surrounding the pancreas consistent with acute pancreatitis. No focal or organized fluid collections are seen at this time. 2. Small amount of pericholecystic fluid, uncertain if this is related to generalized fluid in the upper abdomen from pancreatitis or if this is due to inflammation of the gallbladder. Suggest correlation with gallbladder ultrasound. 3. Small free fluid in the abdomen and pelvis. 4. Fatty liver 5. Focal hypodensity in the lateral aspect of the left breast, felt related to history of recent breast excision. Electronically Signed   By: Donavan Foil M.D.   On: 04/13/2017 00:20    Assessment: 36 year old female admitted with acute, severe pancreatitis (Glasgow-Imrie score 3), possibility of gallbladder sludge/microlithiasis as culprit. LFTs improving today. Pain control improved and nausea resolved with scheduled Dilaudid and Zofran; however, she has worsening tachycardia and tachypnea with increasing leukocytosis. Tmax 99.8. Lipase continuing to trend down. Reports worsening anxiety, which she states is a chronic issue for her. Likely dealing with ileus as well in setting of acute illness, narcotics. Discussed with nursing strict I/O documentation. Remain NPO for now. May need repeat imaging in future if no improvement to assess for complicated presentation.    Plan: Remain NPO Continue fluid resuscitation Strict  I/Os discussed with nursing Pharmacy consult  noted, appreciate Recheck HFP, lipase, CBC in am   Annitta Needs, PhD, ANP-BC [facility]     LOS: 1 day    04/14/2017, 8:13 AM

## 2017-04-14 NOTE — Progress Notes (Signed)
Inpatient Diabetes Program Recommendations  AACE/ADA: New Consensus Statement on Inpatient Glycemic Control (2015)  Target Ranges:  Prepandial:   less than 140 mg/dL      Peak postprandial:   less than 180 mg/dL (1-2 hours)      Critically ill patients:  140 - 180 mg/dL   Results for Kaylee Montgomery, Kaylee Montgomery (MRN [id number]) as of 04/14/2017 10:30  Ref. Range 04/12/2017 18:58 04/13/2017 04:32 04/14/2017 04:29  Glucose Latest Ref Range: 65 - 99 mg/dL 204 (H) 235 (H) 261 (H)  Results for Kaylee Montgomery, Kaylee Montgomery (MRN [id number]) as of 04/14/2017 10:30  Ref. Range 01/10/2015 16:17  Hemoglobin A1C Latest Ref Range: <5.7 % 6.2 (H)   Review of Glycemic Control  Diabetes history: No Outpatient Diabetes medications: NA Current orders for Inpatient glycemic control: None  Inpatient Diabetes Program Recommendations: Correction (SSI): While inpatient, please consider ordering CBGs with Novolog correction scale Q4H. HgbA1C: Please consider ordering an A1C to evaluate glycemic control over the past 2-3 months.  Note: Patient admitted with acute pancreatitis. In reviewing chart noted A1C 6.2% on 01/13/15 and patient had sleeve gastrostomy on 02/18/16. CBGs have been over 200 mg/dl since admitted.   Thanks, Barnie Alderman, RN, MSN, CDE Diabetes Coordinator Inpatient Diabetes Program [phone] (Team Pager from 8am to 5pm)

## 2017-04-14 NOTE — Progress Notes (Signed)
**Note De-Identified  Obfuscation** EKG completed; placed in patient chart.  RN notified

## 2017-04-14 NOTE — Plan of Care (Signed)
Problem: Pain Managment: Goal: General experience of comfort will improve Outcome: Not Progressing PT IS RECEIVING IV PAIN MED Q3HR. THIS IS NOT CONTROLING ALL HER PAIN  Problem: Tissue Perfusion: Goal: Risk factors for ineffective tissue perfusion will decrease Outcome: Not Progressing BP AND HEART RATE REMAIN ELEVATED DESPITE MEDICATION   Problem: Activity: Goal: Risk for activity intolerance will decrease Outcome: Not Progressing PT HAS TREMENDOUS ABDO PAIN WHEN TRYING TO REPOSITION

## 2017-04-14 NOTE — Progress Notes (Signed)
Patient's HR in 140's-150's at arrival of shift. Gave scheduled dose of labetalol 10mg  IV and HR went to 130's for an hour and then went back up. Gave PRN dose labetalol 5mg  IV and HR was unchanged in the 140's. Paged [facility], Schorr, and 1 time dose of 0.25mg  of digoxin IV was ordered and given. All orders complete, will continue to monitor

## 2017-04-14 NOTE — Progress Notes (Addendum)
Added zosyn as lactic acid elevated. Sepsis criteria met so added focused sepsis order set and obtained blood cx and procalcitonin level. Leisa Lenz [facility] [phone]

## 2017-04-14 NOTE — Progress Notes (Addendum)
Pharmacy Antibiotic Note  Kaylee Montgomery is a 36 y.o. female admitted on 04/12/2017 with sepsis.  Pharmacy has been consulted for Zosyn dosing.  Plan: Zosyn 3.375g IV q8h (4 hour infusion).  F/u cx and clinical progress Monitor V/S, labs  Height: 5\' 3"  (160 cm) Weight: 218 lb 7.6 oz (99.1 kg) IBW/kg (Calculated) : 52.4  Temp (24hrs), Avg:98.9 F (37.2 C), Min:98 F (36.7 C), Max:99.8 F (37.7 C)   Recent Labs Lab 04/12/17 1858 04/13/17 0432 04/14/17 0429 04/14/17 0959  WBC 14.7* 12.8* 16.2*  --   CREATININE 0.59 0.56 0.85  --   LATICACIDVEN  --   --   --  3.6*    Estimated Creatinine Clearance: 101.7 mL/min (by C-G formula based on SCr of 0.85 mg/dL).    Allergies  Allergen Reactions  . Bromocriptine Mesylate Nausea Only  . Depo-Medrol [Methylprednisolone Acetate] Itching and Rash  . Imitrex [Sumatriptan] Rash    Antimicrobials this admission: Zosyn 6/26 >>  Dose adjustments this admission: N/A  Microbiology results: 6/25 MRSA PCR: negative  Thank you for allowing pharmacy to be a part of this patient's care.  Isac Sarna, BS [place], [place] Clinical Pharmacist Pager [phone] 04/14/2017 12:33 PM

## 2017-04-14 NOTE — Progress Notes (Addendum)
Patient ID: Kaylee Montgomery, female   DOB: 02/13/1981, 36 y.o.   MRN: [id number]  PROGRESS NOTE    Kaylee Montgomery  RCV:[id number] DOB: Dec 31, 1980 DOA: 04/12/2017  PCP: [name], MD   Brief Narrative:   36 year old female with morbid obesity, hypertension, pituitary tumor, OSA not complainant with CPAP who presented to [facility] with worsening abdominal pain in the epigastric area, nausea and vomiting for past few days piror to the admission. Pain was 10/10 in intensity at worst, burning like sensation and associated with ongoing nausea and vomiting. She was found to have elevated LFT's and lipase in 6000 range. Her CT and abd US showed acute pancreatitis.   Assessment & Plan:   Principal Problem:   Acute idiopathic pancreatitis / Abdominal pain / Nausea and vomiting in adult / Transaminitis  - Abd US showed enlarged pancreas consistent with pancreatitis. No definite gallstones or sonographic Murphy sign.  - CT abdomen showed enlarged edematous pancreas with moderate-to-marked fluid and edema surrounding the pancreas consistent with acute pancreatitis - Lipase has trended down to 977  - LFT's improving: AST 469 --> 123; ALT 530 -_> 277, TB 4 --> 2.3 - Seen by GI, recommends trending LFT's and if they trend up to do MCRP but fortunately LFT's are trending down as noted above  - Continue NPO, IV fluids and antiemetics PRN   Active Problems:   SIRS / Leukocytosis  - SIRS criteria met with low grade fever, tachycardia, tachypnea, leukocytosis - UA clear; CXR without acute cardiopulmonary findings - Will obtain lactic acid - Low threshold to initiate IV abx if lactic acid elevated     Hypokalemia - Due to GI losses - Supplemented and WNL    Tachycardia / Uncontrolled hypertension - Likely in the setting of acute pancreatitis - Seen by cardio - Continue labetalol 15 mg IV Q 8 hours     Morbid obesity due to excess calories (HCC) - Body mass index is 38.7 kg/m. - Counseled on  nutrition  - S/P laparoscopic sleeve gastrectomy      DVT prophylaxis: Heparin subQ Code Status: full code  Family Communication: no family at the bedside this am Disposition Plan: home once pancreatitis resolves    Consultants:   GI, Dr. Oneida Alar  Cardiology, Dr. Harl Bowie   DM coordinator   Procedures:   None   Antimicrobials:   Zosyn    Subjective: Still has abdominal pain this am and nausea.   Objective: Vitals:   04/14/17 0400 04/14/17 0430 04/14/17 0500 04/14/17 0600  BP: (!) 149/121 (!) 136/97 (!) 141/100 140/78  Pulse: (!) 158 (!) 141 (!) 145 (!) 131  Resp: (!) 38 (!) 44 (!) 33 (!) 34  Temp: 99.8 F (37.7 C)     TempSrc: Oral     SpO2: 95% 94% 95% 96%  Weight:      Height:        Intake/Output Summary (Last 24 hours) at 04/14/17 0835 Last data filed at 04/14/17 0600  Gross per 24 hour  Intake          4829.16 ml  Output              450 ml  Net          4379.16 ml   Filed Weights   04/12/17 1832 04/13/17 0251  Weight: 99.3 kg (219 lb) 99.1 kg (218 lb 7.6 oz)    Examination:  General exam: Appears calm and comfortable  Respiratory system: Clear  to auscultation. Respiratory effort normal. Cardiovascular system: S1 & S2 heard, RRR. No JVD, murmurs, rubs, gallops or clicks.  Gastrointestinal system: abd is tender in upper quadrant, (+) BS, no rebound tenderness  Central nervous system: Alert and oriented. No focal neurological deficits. Extremities: Symmetric 5 x 5 power. Skin: No rashes, lesions or ulcers Psychiatry: Judgement and insight appear normal. Mood & affect appropriate.   Data Reviewed: I have personally reviewed following labs and imaging studies  CBC:  Recent Labs Lab 04/12/17 1834 04/12/17 1858 04/13/17 0432 04/14/17 0429  WBC  --  14.7* 12.8* 16.2*  NEUTROABS 10.8*  --   --   --   HGB  --  14.1 16.2* 15.0  HCT  --  42.9 49.2* 46.5*  MCV  --  78.3 77.2* 80.4  PLT  --  391 364 101   Basic Metabolic Panel:  Recent  Labs Lab 04/12/17 1858 04/13/17 0432 04/14/17 0429  NA 143 140 139  K 2.8* 3.2* 4.5  CL 107 103 110  CO2 25 22 20*  GLUCOSE 204* 235* 261*  BUN '9 8 15  '$ CREATININE 0.59 0.56 0.85  CALCIUM 10.0 9.1 8.2*   GFR: Estimated Creatinine Clearance: 101.7 mL/min (by C-G formula based on SCr of 0.85 mg/dL). Liver Function Tests:  Recent Labs Lab 04/12/17 1858 04/13/17 0432 04/14/17 0429  AST 332* 469* 123*  ALT 263* 530* 277*  ALKPHOS 94 104 74  BILITOT 2.3* 4.0* 2.3*  PROT 7.9 7.5 5.7*  ALBUMIN 4.6 4.3 2.9*    Recent Labs Lab 04/12/17 1858 04/13/17 0432 04/14/17 0429  LIPASE 6,774* 1,167* 977*   No results for input(s): AMMONIA in the last 168 hours. Coagulation Profile: No results for input(s): INR, PROTIME in the last 168 hours. Cardiac Enzymes: No results for input(s): CKTOTAL, CKMB, CKMBINDEX, TROPONINI in the last 168 hours. BNP (last 3 results) No results for input(s): PROBNP in the last 8760 hours. HbA1C: No results for input(s): HGBA1C in the last 72 hours. CBG: No results for input(s): GLUCAP in the last 168 hours. Lipid Profile:  Recent Labs  04/13/17 1630  CHOL 156  HDL 35*  LDLCALC 101*  TRIG 101  CHOLHDL 4.5   Thyroid Function Tests:  Recent Labs  04/12/17 1859  TSH 1.526   Anemia Panel: No results for input(s): VITAMINB12, FOLATE, FERRITIN, TIBC, IRON, RETICCTPCT in the last 72 hours. Urine analysis:    Component Value Date/Time   COLORURINE YELLOW 04/13/2017 0550   APPEARANCEUR CLEAR 04/13/2017 0550   LABSPEC >1.046 (H) 04/13/2017 0550   PHURINE 6.0 04/13/2017 0550   GLUCOSEU 50 (A) 04/13/2017 0550   HGBUR NEGATIVE 04/13/2017 0550   BILIRUBINUR NEGATIVE 04/13/2017 0550   BILIRUBINUR neg 10/24/2013 1255   KETONESUR 20 (A) 04/13/2017 0550   PROTEINUR 30 (A) 04/13/2017 0550   UROBILINOGEN 1.0 10/24/2013 1255   UROBILINOGEN 0.2 10/21/2013 0035   NITRITE NEGATIVE 04/13/2017 0550   LEUKOCYTESUR NEGATIVE 04/13/2017 0550   Sepsis  Labs: '@LABRCNTIP'$ (procalcitonin:4,lacticidven:4)   ) Recent Results (from the past 240 hour(s))  MRSA PCR Screening     Status: None   Collection Time: 04/13/17  2:50 AM  Result Value Ref Range Status   MRSA by PCR NEGATIVE NEGATIVE Final    Comment:        The GeneXpert MRSA Assay (FDA approved for NASAL specimens only), is one component of a comprehensive MRSA colonization surveillance program. It is not intended to diagnose MRSA infection nor to guide or monitor treatment for  MRSA infections.       Radiology Studies: Dg Chest 2 View  Result Date: 04/12/2017 CLINICAL DATA:  Patient c/o upper abd pain. Per patient nausea, vomiting w/ bright red blood, and diarrhea. Patient has generalized weakness. History of HTN, Breast mass. EXAM: CHEST  2 VIEW COMPARISON:  01/03/2016 FINDINGS: Cardiac silhouette is top-normal in size. No mediastinal or hilar masses. No evidence of adenopathy. Clear lungs.  No pleural effusion or pneumothorax. Skeletal structures are intact. IMPRESSION: No active cardiopulmonary disease. Electronically Signed   By: Amie Portland M.D.   On: 04/12/2017 23:52   US Abdomen Complete  Result Date: 04/13/2017 CLINICAL DATA:  Pancreatitis, nausea, vomiting, diarrhea, breast mass EXAM: ABDOMEN ULTRASOUND COMPLETE COMPARISON:  CT abdomen pelvis 04/12/2017 FINDINGS: Gallbladder: Dependent nonshadowing echogenic material within gallbladder lumen question sludge. Gallbladder wall upper normal thickness 3 mm diameter. No sonographic Murphy sign or definite shadowing calculi. Common bile duct: Diameter: 3 mm diameter , normal Liver: Echogenic, likely fatty infiltration, though this can be seen with cirrhosis and certain infiltrative disorders. Area of focally decreased attenuation within liver adjacent to gallbladder fossa 3.4 x 2.9 x 3.5 cm question fatty sparing versus hypoechoic mass; no definite mass lesion seen at this site on prior CT, favor sparing. IVC: Short segment of  intrahepatic IVC normal appearance, intrahepatic portion obscured by bowel gas Pancreas: Thickened and hypoechoic consistent with pancreatitis features seen on CT. No discrete mass, pseudocyst, or ductal dilatation. No shadowing calcification. Portions of the tail and uncinate inner obscured by bowel gas. Spleen: Normal appearance, 5.7 cm length Right Kidney: Length: 11.1 cm. Normal morphology without mass or hydronephrosis. Left Kidney: Length: 11.8 cm. Normal morphology without mass or hydronephrosis. Abdominal aorta: Normal caliber Other findings: No free fluid IMPRESSION: Enlarged pancreas consistent with pancreatitis features seen on CT. Incomplete visualization of distal pancreatic tail and portions of uncinate. Probable sludge within gallbladder with upper normal gallbladder wall thickness. No definite gallstones or sonographic Murphy sign. Hypoechoic region within liver 3.5 cm greatest size, question focal sparing within a background of fatty infiltration ; no definite hepatic mass was seen at this site on the preceding CT. Electronically Signed   By: Ulyses Southward M.D.   On: 04/13/2017 10:14   Ct Abdomen Pelvis W Contrast  Result Date: 04/13/2017 CLINICAL DATA:  Upper abdominal pain, nausea vomiting and diarrhea EXAM: CT ABDOMEN AND PELVIS WITH CONTRAST TECHNIQUE: Multidetector CT imaging of the abdomen and pelvis was performed using the standard protocol following bolus administration of intravenous contrast. CONTRAST:  ISOVUE-300 IOPAMIDOL (ISOVUE-300) INJECTION 61% COMPARISON:  10/24/2013, mammogram 03/04/2017 FINDINGS: Lower chest: Lung bases demonstrate no acute consolidation or pleural effusion. Mild cardiomegaly. Partially visualized low-density lesion in the lateral aspect of left breast, likely corresponding to history of recent excision. Hepatobiliary: Mild steatosis. No biliary dilatation. Small amount of pericholecystic fluid. No calcified gallstones. Pancreas: Enlarged and edematous.  Moderate-to-marked peripancreatic fluid and inflammatory change consistent with acute pancreatitis. No definite focal fluid collections at the pancreas bed. Spleen: Normal in size without focal abnormality. Adrenals/Urinary Tract: Adrenal glands are unremarkable. Kidneys are normal, without renal calculi, focal lesion, or hydronephrosis. Bladder is unremarkable. Stomach/Bowel: Postsurgical changes of the stomach. Appendix appears normal. No evidence of bowel wall thickening, distention, or inflammatory changes. Vascular/Lymphatic: Normal portal vein and splenic vein enhancement. Non aneurysmal aorta. No significantly enlarged lymph nodes. Reproductive: Uterus and bilateral adnexa are unremarkable. Other: Small free fluid in the pelvis. Small to moderate fluid in the upper abdomen. No free air.  Musculoskeletal: No acute or significant osseous findings. IMPRESSION: 1. Enlarged edematous pancreas with moderate-to-marked fluid and edema surrounding the pancreas consistent with acute pancreatitis. No focal or organized fluid collections are seen at this time. 2. Small amount of pericholecystic fluid, uncertain if this is related to generalized fluid in the upper abdomen from pancreatitis or if this is due to inflammation of the gallbladder. Suggest correlation with gallbladder ultrasound. 3. Small free fluid in the abdomen and pelvis. 4. Fatty liver 5. Focal hypodensity in the lateral aspect of the left breast, felt related to history of recent breast excision. Electronically Signed   By: Donavan Foil M.D.   On: 04/13/2017 00:20        Scheduled Meds: . heparin  7,500 Units Subcutaneous Q8H  .  HYDROmorphone (DILAUDID) injection  1 mg Intravenous Q6H  . labetalol  15 mg Intravenous Q8H  . ondansetron (ZOFRAN) IV  4 mg Intravenous TID WC & HS  . pantoprazole (PROTONIX) IV  40 mg Intravenous BID AC   Continuous Infusions: . dextrose 5% lactated ringers with KCl 20 mEq/L 250 mL/hr at 04/14/17 0554      LOS: 1 day    Time spent: 25 minutes  Greater than 50% of the time spent on counseling and coordinating the care.   Leisa Lenz, MD [facility] Pager [phone]  If 7PM-7AM, please contact night-coverage [email or web address] Password [facility] 04/14/2017, 8:35 AM

## 2017-04-15 DIAGNOSIS — G4733 Obstructive sleep apnea (adult) (pediatric): Secondary | ICD-10-CM

## 2017-04-15 LAB — COMPREHENSIVE METABOLIC PANEL
ALT: 145 U/L — ABNORMAL HIGH (ref 14–54)
ALT: 139 U/L — ABNORMAL HIGH (ref 14–54)
AST: 50 U/L — ABNORMAL HIGH (ref 15–41)
AST: 46 U/L — ABNORMAL HIGH (ref 15–41)
Albumin: 2.6 g/dL — ABNORMAL LOW (ref 3.5–5.0)
Albumin: 2.7 g/dL — ABNORMAL LOW (ref 3.5–5.0)
Alkaline Phosphatase: 55 U/L (ref 38–126)
Alkaline Phosphatase: 56 U/L (ref 38–126)
Anion gap: 4 — ABNORMAL LOW (ref 5–15)
Anion gap: 5 (ref 5–15)
BUN: 15 mg/dL (ref 6–20)
BUN: 13 mg/dL (ref 6–20)
CO2: 25 mmol/L (ref 22–32)
CO2: 26 mmol/L (ref 22–32)
Calcium: 8.4 mg/dL — ABNORMAL LOW (ref 8.9–10.3)
Calcium: 8.5 mg/dL — ABNORMAL LOW (ref 8.9–10.3)
Chloride: 112 mmol/L — ABNORMAL HIGH (ref 101–111)
Chloride: 109 mmol/L (ref 101–111)
Creatinine, Ser: 0.82 mg/dL (ref 0.44–1.00)
Creatinine, Ser: 0.83 mg/dL (ref 0.44–1.00)
GFR calc Af Amer: >60 mL/min (ref >60)
GFR calc Af Amer: >60 mL/min (ref >60)
GFR calc non Af Amer: >60 mL/min (ref >60)
GFR calc non Af Amer: >60 mL/min (ref >60)
Glucose, Bld: 152 mg/dL — ABNORMAL HIGH (ref 65–99)
Glucose, Bld: 169 mg/dL — ABNORMAL HIGH (ref 65–99)
Potassium: 4.4 mmol/L (ref 3.5–5.1)
Potassium: 4 mmol/L (ref 3.5–5.1)
Sodium: 141 mmol/L (ref 135–145)
Sodium: 140 mmol/L (ref 135–145)
Total Bilirubin: 2.2 mg/dL — ABNORMAL HIGH (ref 0.3–1.2)
Total Bilirubin: 2.4 mg/dL — ABNORMAL HIGH (ref 0.3–1.2)
Total Protein: 5.5 g/dL — ABNORMAL LOW (ref 6.5–8.1)
Total Protein: 5.8 g/dL — ABNORMAL LOW (ref 6.5–8.1)

## 2017-04-15 LAB — GLUCOSE, CAPILLARY
Glucose-Capillary: 106 mg/dL — ABNORMAL HIGH (ref 65–99)
Glucose-Capillary: 126 mg/dL — ABNORMAL HIGH (ref 65–99)
Glucose-Capillary: 130 mg/dL — ABNORMAL HIGH (ref 65–99)
Glucose-Capillary: 130 mg/dL — ABNORMAL HIGH (ref 65–99)
Glucose-Capillary: 138 mg/dL — ABNORMAL HIGH (ref 65–99)
Glucose-Capillary: 147 mg/dL — ABNORMAL HIGH (ref 65–99)

## 2017-04-15 LAB — CBC WITH DIFFERENTIAL/PLATELET
Basophils Absolute: 0 10*3/uL (ref 0.0–0.1)
Basophils Relative: 0 %
Eosinophils Absolute: 0 10*3/uL (ref 0.0–0.7)
Eosinophils Relative: 0 %
HCT: 37.3 % (ref 36.0–46.0)
Hemoglobin: 11.8 g/dL — ABNORMAL LOW (ref 12.0–15.0)
Lymphocytes Relative: 14 %
Lymphs Abs: 2.3 10*3/uL (ref 0.7–4.0)
MCH: 25.9 pg — ABNORMAL LOW (ref 26.0–34.0)
MCHC: 31.6 g/dL (ref 30.0–36.0)
MCV: 81.8 fL (ref 78.0–100.0)
Monocytes Absolute: 1.3 10*3/uL — ABNORMAL HIGH (ref 0.1–1.0)
Monocytes Relative: 8 %
Neutro Abs: 12.6 10*3/uL — ABNORMAL HIGH (ref 1.7–7.7)
Neutrophils Relative %: 78 %
Platelets: 190 10*3/uL (ref 150–400)
RBC: 4.56 MIL/uL (ref 3.87–5.11)
RDW: 14.5 % (ref 11.5–15.5)
WBC: 16.2 10*3/uL — ABNORMAL HIGH (ref 4.0–10.5)

## 2017-04-15 LAB — LIPASE, BLOOD: Lipase: 354 U/L — ABNORMAL HIGH (ref 11–51)

## 2017-04-15 LAB — BRAIN NATRIURETIC PEPTIDE: B Natriuretic Peptide: 66 pg/mL (ref 0.0–100.0)

## 2017-04-15 LAB — LACTIC ACID, PLASMA: Lactic Acid, Venous: 1.5 mmol/L (ref 0.5–1.9)

## 2017-04-15 MED ORDER — DIPHENHYDRAMINE HCL 50 MG/ML IJ SOLN: 12.5000 mg | Freq: Four times a day (QID) | INTRAMUSCULAR | Status: DC | PRN

## 2017-04-15 MED ORDER — INSULIN ASPART 100 UNIT/ML ~~LOC~~ SOLN: 0.0000 [IU] | Freq: Every day | SUBCUTANEOUS | Status: DC

## 2017-04-15 MED ORDER — HYDROMORPHONE 1 MG/ML IV SOLN
INTRAVENOUS | Status: DC
  Administered 2017-04-15: 0.6 mL via INTRAVENOUS
  Administered 2017-04-15: 11:00:00 via INTRAVENOUS
  Administered 2017-04-16: 1.8 mL via INTRAVENOUS
  Administered 2017-04-16: 1.2 mL via INTRAVENOUS
  Administered 2017-04-16: 2.1 mL via INTRAVENOUS
  Administered 2017-04-17: 1.8 mg via INTRAVENOUS
  Administered 2017-04-17: 2.1 mL via INTRAVENOUS
  Administered 2017-04-17: 07:00:00 via INTRAVENOUS
  Administered 2017-04-17: 2.4 mL via INTRAVENOUS
  Filled 2017-04-15 (×2): qty 25

## 2017-04-15 MED ORDER — INSULIN ASPART 100 UNIT/ML ~~LOC~~ SOLN
0.0000 [IU] | Freq: Three times a day (TID) | SUBCUTANEOUS | Status: DC
  Administered 2017-04-16 (×2): 1 [IU] via SUBCUTANEOUS
  Administered 2017-04-16: 2 [IU] via SUBCUTANEOUS
  Administered 2017-04-17: 1 [IU] via SUBCUTANEOUS
  Administered 2017-04-17: 2 [IU] via SUBCUTANEOUS

## 2017-04-15 MED ORDER — ONDANSETRON HCL 4 MG/2ML IJ SOLN
4.0000 mg | Freq: Four times a day (QID) | INTRAMUSCULAR | Status: DC | PRN
  Filled 2017-04-15: qty 2

## 2017-04-15 MED ORDER — NALOXONE HCL 0.4 MG/ML IJ SOLN: 0.4000 mg | INTRAMUSCULAR | Status: DC | PRN

## 2017-04-15 MED ORDER — SENNOSIDES-DOCUSATE SODIUM 8.6-50 MG PO TABS
1.0000 | ORAL_TABLET | Freq: Two times a day (BID) | ORAL | Status: DC
  Administered 2017-04-15 – 2017-04-16 (×4): 1 via ORAL
  Filled 2017-04-15 (×5): qty 1

## 2017-04-15 MED ORDER — FUROSEMIDE 10 MG/ML IJ SOLN
40.0000 mg | Freq: Once | INTRAMUSCULAR | Status: AC
  Administered 2017-04-15: 40 mg via INTRAVENOUS
  Filled 2017-04-15: qty 4

## 2017-04-15 MED ORDER — DIPHENHYDRAMINE HCL 12.5 MG/5ML PO ELIX: 12.5000 mg | ORAL_SOLUTION | Freq: Four times a day (QID) | ORAL | Status: DC | PRN

## 2017-04-15 MED ORDER — SODIUM CHLORIDE 0.9% FLUSH: 9.0000 mL | INTRAVENOUS | Status: DC | PRN

## 2017-04-15 NOTE — Progress Notes (Signed)
MEDICATION RELATED CONSULT NOTE - INITIAL   Pharmacy Consult for PCA Indication: pain control, pancreatitis  Allergies  Allergen Reactions  . Bromocriptine Mesylate Nausea Only  . Depo-Medrol [Methylprednisolone Acetate] Itching and Rash  . Imitrex [Sumatriptan] Rash   Patient Measurements: Height: 5\' 3"  (160 cm) Weight: 218 lb 7.6 oz (99.1 kg) IBW/kg (Calculated) : 52.4  Vital Signs: Temp: 99.2 F (37.3 C) (06/27 0730) Temp Source: Oral (06/27 0730) BP: 133/94 (06/27 0800) Pulse Rate: 134 (06/27 0800) Intake/Output from previous day: 06/26 0701 - 06/27 0700 In: 4460.4 [I.V.:4310.4; IV Piggyback:150] Out: 500 [Urine:500] Intake/Output from this shift: No intake/output data recorded.  Labs:  Recent Labs  04/13/17 0432 04/13/17 1619 04/14/17 0429 04/15/17 0507  WBC 12.8*  --  16.2* 16.2*  HGB 16.2*  --  15.0 11.8*  HCT 49.2*  --  46.5* 37.3  PLT 364  --  248 190  CREATININE 0.56  --  0.85 0.82  ALBUMIN 4.3  --  2.9* 2.6*  PROT 7.5  --  5.7* 5.5*  AST 469*  --  123* 50*  ALT 530*  --  277* 145*  ALKPHOS 104  --  74 55  BILITOT 4.0*  --  2.3* 2.2*  BILIDIR  --  1.2*  --   --    Estimated Creatinine Clearance: 105.4 mL/min (by C-G formula based on SCr of 0.82 mg/dL).  Medical History: Past Medical History:  Diagnosis Date  . Breast mass 02/2017  . Dental crown present   . Hypertension    states BP fluctuates; has been on med. x 1 yr.  . Migraines   . Pituitary adenoma ([place])   . Seasonal allergies   . Sleep apnea    no CPAP use in > 6 mos.   Medications:  Scheduled:  . ALPRAZolam  0.25 mg Oral BID BM & HS  . chlorhexidine  15 mL Mouth Rinse BID  . heparin  7,500 Units Subcutaneous Q8H  . HYDROmorphone   Intravenous Q4H  . insulin aspart  0-9 Units Subcutaneous Q4H  . labetalol  15 mg Intravenous Q8H  . mouth rinse  15 mL Mouth Rinse q12n4p  . ondansetron (ZOFRAN) IV  4 mg Intravenous TID WC & HS  . pantoprazole (PROTONIX) IV  40 mg Intravenous BID  AC  . senna-docusate  1 tablet Oral BID   Assessment: 36yo female with pancreatitis and pain difficult to control.  Currently on scheduled IV Dilaudid doses.  Asked to initiate PCA.   Shortness of breath worsened when attempting to sit on bed and had to lay down. Dyspnea with exertion. Pain improved from admission but still not controlled.    Goal of Therapy:  Improved pain control  Plan:  Begin Dilaudid PCA, full dose protocol Monitor labs, pain control, vitals and respirations  Rodricus Candelaria A 04/15/2017,9:48 AM

## 2017-04-15 NOTE — Progress Notes (Signed)
Subjective: Ate a few bites of jello this morning. Shortness of breath worsened when attempting to sit on bed and had to lay down. Dyspnea with exertion. Pain improved from admission but still not controlled. No vomiting or nausea. Burping, no flatus.   Objective: Vital signs in last 24 hours: Temp:  [98.1 F (36.7 C)-99.2 F (37.3 C)] 99.2 F (37.3 C) (06/27 0730) Pulse Rate:  [127-165] 134 (06/27 0800) Resp:  [18-48] 23 (06/27 0800) BP: (93-163)/(71-153) 133/94 (06/27 0800) SpO2:  [93 %-99 %] 96 % (06/27 0800) Last BM Date: 04/13/17 General:   Alert and oriented, resting with eyes closed but easily awakens and converses.  Head:  Normocephalic and atraumatic. Heart:  S1, S2 present, tachycardic  Lungs: tachypneic but does not appear labored. Coarse Abdomen:  Bowel sounds remain hypoactive but improved from yesterday, less distended, still with TTP upper abdomen, no rebound or guarding Extremities:  Without edema. Neurologic:  Alert and  oriented x4 Psych:  Alert and cooperative. Normal mood and affect.  Intake/Output from previous day: 06/26 0701 - 06/27 0700 In: 4460.4 [I.V.:4310.4; IV Piggyback:150] Out: 500 [Urine:500] Intake/Output this shift: No intake/output data recorded.  Lab Results:  Recent Labs  04/13/17 0432 04/14/17 0429 04/15/17 0507  WBC 12.8* 16.2* 16.2*  HGB 16.2* 15.0 11.8*  HCT 49.2* 46.5* 37.3  PLT 364 248 190   BMET  Recent Labs  04/13/17 0432 04/14/17 0429 04/15/17 0507  NA 140 139 141  K 3.2* 4.5 4.4  CL 103 110 112*  CO2 22 20* 25  GLUCOSE 235* 261* 152*  BUN 8 15 15   CREATININE 0.56 0.85 0.82  CALCIUM 9.1 8.2* 8.4*   LFT  Recent Labs  04/13/17 0432 04/13/17 1619 04/14/17 0429 04/15/17 0507  PROT 7.5  --  5.7* 5.5*  ALBUMIN 4.3  --  2.9* 2.6*  AST 469*  --  123* 50*  ALT 530*  --  277* 145*  ALKPHOS 104  --  74 55  BILITOT 4.0*  --  2.3* 2.2*  BILIDIR  --  1.2*  --   --      Studies/Results: US Abdomen  Complete  Result Date: 04/13/2017 CLINICAL DATA:  Pancreatitis, nausea, vomiting, diarrhea, breast mass EXAM: ABDOMEN ULTRASOUND COMPLETE COMPARISON:  CT abdomen pelvis 04/12/2017 FINDINGS: Gallbladder: Dependent nonshadowing echogenic material within gallbladder lumen question sludge. Gallbladder wall upper normal thickness 3 mm diameter. No sonographic Murphy sign or definite shadowing calculi. Common bile duct: Diameter: 3 mm diameter , normal Liver: Echogenic, likely fatty infiltration, though this can be seen with cirrhosis and certain infiltrative disorders. Area of focally decreased attenuation within liver adjacent to gallbladder fossa 3.4 x 2.9 x 3.5 cm question fatty sparing versus hypoechoic mass; no definite mass lesion seen at this site on prior CT, favor sparing. IVC: Short segment of intrahepatic IVC normal appearance, intrahepatic portion obscured by bowel gas Pancreas: Thickened and hypoechoic consistent with pancreatitis features seen on CT. No discrete mass, pseudocyst, or ductal dilatation. No shadowing calcification. Portions of the tail and uncinate inner obscured by bowel gas. Spleen: Normal appearance, 5.7 cm length Right Kidney: Length: 11.1 cm. Normal morphology without mass or hydronephrosis. Left Kidney: Length: 11.8 cm. Normal morphology without mass or hydronephrosis. Abdominal aorta: Normal caliber Other findings: No free fluid IMPRESSION: Enlarged pancreas consistent with pancreatitis features seen on CT. Incomplete visualization of distal pancreatic tail and portions of uncinate. Probable sludge within gallbladder with upper normal gallbladder wall thickness. No definite gallstones or sonographic  Murphy sign. Hypoechoic region within liver 3.5 cm greatest size, question focal sparing within a background of fatty infiltration ; no definite hepatic mass was seen at this site on the preceding CT. Electronically Signed   By: Lavonia Dana M.D.   On: 04/13/2017 10:14   Dg Chest Port 1  View  Result Date: 04/14/2017 CLINICAL DATA:  Sepsis. EXAM: PORTABLE CHEST 1 VIEW COMPARISON:  04/12/2017 FINDINGS: 1217 hours. Markedly low lung volumes. The cardio pericardial silhouette is enlarged. Vascular congestion with interstitial opacity the basilar airspace disease suggests pulmonary edema. A component of the basilar opacity may be atelectatic. Question small bilateral pleural effusions. The visualized bony structures of the thorax are intact. Telemetry leads overlie the chest. IMPRESSION: Markedly low volume film with cardiomegaly, vascular congestion and probable edema. Bibasilar atelectasis with probable small bilateral effusions. Electronically Signed   By: Misty Stanley M.D.   On: 04/14/2017 12:36   Dg Chest Port 1v Same Day  Result Date: 04/14/2017 CLINICAL DATA:  36 y/o  F; PICC line placement. EXAM: PORTABLE CHEST 1 VIEW COMPARISON:  04/14/2017 chest radiograph FINDINGS: Stable cardiac silhouette given projection and technique. Low lung volumes accentuate pulmonary markings. Bibasilar opacities probably represent atelectasis and possible small effusions. Right PICC line tip projects over the cavoatrial junction. No acute osseous abnormality is evident. IMPRESSION: Right PICC line tip projects over cavoatrial junction. Stable low lung volumes and bibasilar opacities probably representing atelectasis and effusions. Electronically Signed   By: Kristine Garbe M.D.   On: 04/14/2017 22:01    Assessment: 36 year old female admitted with severe pancreatitis, with continued improvement in LFTs, lipase, and leukocytosis remains stable. Tmax 99.2. Overall, pain is slowly improving since admission but still not ideally controlled. Tachycardia persists and is multifactorial in this setting. Tolerating several bites of jello this morning but with shortness of breath limiting sitting on side of bed, movement. Chest xray with probable edema and I/Os if accurately documented note positive 9  liters. IVFs have been reduced to maintenance and will give one dose of Lasix now. Continue to monitor respiratory status, I/Os. Will request pharmacy consult for PCA for analgesic control. Nursing staff also requesting PT consult due to deconditioning and assistance with helping patient out of bed. Overall, she is making slow improvements since admission.   Plan: Pharmacy consult for PCA PT consult Continue Xanax dosing due to underlying anxiety Add Senokot BID: clinically with ileus but no worsening signs/symptoms Lasix 40 mg IV now IV fluids LR at 125 Continue clear liquids Continue Protonix 40 mg IV BID Continue scheduled Zofran Weigh risks and benefits of antibiotic therapy Repeat lipase, HFP, CBC in am Will continue to follow with you   Annitta Needs, PhD, ANP-BC [facility]    LOS: 2 days    04/15/2017, 8:09 AM

## 2017-04-15 NOTE — Evaluation (Signed)
Physical Therapy Evaluation Patient Details Name: Kaylee Montgomery MRN: [id number] DOB: 1981-05-21 Today's Date: 04/15/2017   History of Present Illness  Kaylee Montgomery is a 36yo black female who comes to ED with severe LUQ pain, N/V. She is noted to have acute pancreatitis, has since been with tachycardia in 120-140s, and HTN. As of 6/26, CXR revealing pulmonary edema, vascular congestion, cardiomegaly, and basilar atelectasis. PMH: pituitary tumor, mirgraine, OAS c CPAP non-compliance. 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   Clinical Impression  Pt admitted with above diagnosis. Pt currently with functional limitations due to the deficits listed below (see "PT Problem List"). Upon entry, the patient is received semirecumbent in bed, family present.   The pt is drowsy and somnolent, but agreeable to participate. Pain remains 6/10 throughout, without indication of acute exacerbation related to movement. The pt is oriented x3, pleasant, conversational, and following simple and multi-step commands consistently, but requires additional time to respond to basic questions. Functional mobility assessment demonstrates moderate-severe weakness, the pt now requiring Mod-assist physical assistance for bed mobility; transfers and AMB are slow and labored, requiring maximal effort, and demonstrative of gross instability in knee. This is far from patients baseline as a fully independent community dwelling young adult. Additional mobility is not attempted due to elevated HR, however BP trends improve as patient moves from supine to bed, diastolic at 82 mmHg. The patient's son who lives with her is available to assist (he works 2nd shift) and the patient's mother attests to being available for support as well. Pt will benefit from skilled PT intervention to increase independence and safety with basic mobility in preparation for discharge to the venue  listed below.      Follow Up Recommendations Home health PT;Supervision for mobility/OOB    Equipment Recommendations  Rolling walker with 5" wheels    Recommendations for Other Services       Precautions / Restrictions Precautions Precautions: Fall Precaution Comments: Attending asking bed-rest when HR is >130 resting; GI recommending OOB. Restrictions Weight Bearing Restrictions: No      Mobility  Bed Mobility Overal bed mobility: Needs Assistance Bed Mobility: Supine to Sit     Supine to sit: Max assist     General bed mobility comments: weakness in legs, and difficulty turning/scooting without phsical assist  Transfers Overall transfer level: Needs assistance Equipment used: 1 person hand held assist Transfers: Sit to/from Stand Sit to Stand: Min assist;From elevated surface         General transfer comment: RW appropriate for future sessions   Ambulation/Gait Ambulation/Gait assistance:  (2 stand-pivot transfers only, due to leg weakness adn tachycardia in 130s. )              Stairs            Wheelchair Mobility    Modified Rankin (Stroke Patients Only)       Balance Overall balance assessment: Needs assistance Sitting-balance support: Single extremity supported;Feet supported Sitting balance-Leahy Scale: Good     Standing balance support: During functional activity Standing balance-Leahy Scale: Poor                               Pertinent Vitals/Pain Pain Assessment: 0-10 Pain Score: 6  Pain Location: LUQ, Left upper back at T6 level Pain Descriptors / Indicators: Sharp Pain Intervention(s): Limited activity within patient's tolerance;Monitored during session;Premedicated before session;Repositioned (  doe snot appear mechanically aggravated with mobility. )    Home Living Family/patient expects to be discharged to:: Private residence Living Arrangements: Other (Comment) Available Help at Discharge: Family Type  of Home: House Home Access: Stairs to enter Entrance Stairs-Rails: Right Entrance Stairs-Number of Steps: 3 Home Layout: One level Home Equipment: None      Prior Function Level of Independence: Independent         Comments: works for [employer] social services      Higher education careers adviser        Extremity/Trunk Assessment   Upper Extremity Assessment Upper Extremity Assessment: Generalized weakness    Lower Extremity Assessment Lower Extremity Assessment: Generalized weakness       Communication   Communication: No difficulties  Cognition Arousal/Alertness: Lethargic;Suspect due to medications Behavior During Therapy: [facility] for tasks assessed/performed Overall Cognitive Status: Within Functional Limits for tasks assessed Area of Impairment: Awareness                                      General Comments      Exercises     Assessment/Plan    PT Assessment Patient needs continued PT services  PT Problem List Decreased strength;Decreased activity tolerance;Decreased mobility;Decreased balance;Cardiopulmonary status limiting activity;Obesity;Decreased knowledge of use of DME       PT Treatment Interventions Gait training;DME instruction;Functional mobility training;Stair training;Therapeutic activities;Therapeutic exercise;Balance training;Patient/family education    PT Goals (Current goals can be found in the Care Plan section)  Acute Rehab PT Goals PT Goal Formulation: Patient unable to participate in goal setting    Frequency Min 2X/week   Barriers to discharge        Co-evaluation               AM-PAC PT "6 Clicks" Daily Activity  Outcome Measure Difficulty turning over in bed (including adjusting bedclothes, sheets and blankets)?: Total Difficulty moving from lying on back to sitting on the side of the bed? : Total Difficulty sitting down on and standing up from a chair with arms (e.g., wheelchair, bedside commode, etc,.)?:  Total Help needed moving to and from a bed to chair (including a wheelchair)?: Total Help needed walking in hospital room?: Total Help needed climbing 3-5 steps with a railing? : Total 6 Click Score: 6    End of Session Equipment Utilized During Treatment: Gait belt;Oxygen Activity Tolerance: Patient tolerated treatment well;Patient limited by fatigue;Patient limited by lethargy;Treatment limited secondary to medical complications (Comment) Patient left: in chair;with family/visitor present;Other (comment) (PCA in lap ) Nurse Communication: Mobility status PT Visit Diagnosis: Muscle weakness (generalized) (M62.81);Difficulty in walking, not elsewhere classified (R26.2)    Time: 7829-5621 PT Time Calculation (min) (ACUTE ONLY): 41 min   Charges:   PT Evaluation $PT Eval Moderate Complexity: 1 Procedure PT Treatments $Therapeutic Activity: 8-22 mins   PT G Codes:        3:47 PM, 20-Apr-2017 Rosamaria Lints, PT, DPT Physical Therapist - [place] [phone] (Office)    Lynnley Doddridge C Apr 20, 2017, 3:43 PM

## 2017-04-15 NOTE — Progress Notes (Signed)
PROGRESS NOTE  Kaylee Montgomery  QVZ:[id number] DOB: 12-30-1980 DOA: 04/12/2017 PCP: [name], MD  Brief Narrative:   36 year old female with morbid obesity, hypertension, pituitary tumor, OSA not complainant with CPAP who presented to [facility] with worsening abdominal pain in the epigastric area, nausea and vomiting for past few days piror to the admission. Pain was 10/10 in intensity at worst, burning like sensation and associated with ongoing nausea and vomiting. She was found to have elevated LFT's and lipase in 6000 range. Her CT and abd US showed acute pancreatitis.   Assessment & Plan:   Principal Problem:   Acute pancreatitis Active Problems:   PITUITARY ADENOMA   Morbid obesity ([place])   Seasonal and perennial allergic rhinitis   Prediabetes   Obstructive sleep apnea   S/P laparoscopic sleeve gastrectomy   Transaminitis  SIRS secondary to severe acute idiopathic pancreatitis with ongoing tachycardia and pulmonary edema  - Abd US showed enlarged pancreas consistent with pancreatitis. No definite gallstones or sonographic Murphy sign.  - CT abdomen showed enlarged edematous pancreas with moderate-to-marked fluid and edema surrounding the pancreas consistent with acute pancreatitis - Lipase and LFTs trending down - CLD -  Continue zosyn   Acute hypoxic respiratory failure likely secondary to ARDS/third spacing from pancreatitis -  bipap prn -  cpap nightly -  IS -  To chair if tachycardia and tachypnea not problematic -  Check lactic acid:  Normal.  Agree with decreasing IVF somewhat -  Disagree with lasix    Hypokalemia, resolved - Due to GI losses - Supplemented and WNL    Tachycardia / Uncontrolled hypertension - Likely in the setting of acute pancreatitis - Seen by cardio - Continue labetalol 15 mg IV Q 8 hours     Morbid obesity due to excess calories (HCC) - Body mass index is 38.7 kg/m. - Counseled on nutrition  - S/P laparoscopic sleeve  gastrectomy  DVT prophylaxis:  heparin Code Status:  full Family Communication:  Patient and her mother Disposition Plan:  Pending.  Still serious condition with ongoing tachycardia   Consultants:   GI  Cardiology  Procedures:  none  Antimicrobials:  Anti-infectives    Start     Dose/Rate Route Frequency Ordered Stop   04/14/17 1200  piperacillin-tazobactam (ZOSYN) IVPB 3.375 g  Status:  Discontinued     3.375 g 12.5 mL/hr over 240 Minutes Intravenous Every 8 hours 04/14/17 1142 04/15/17 1424       Subjective: SOB.  Denies chest pains.  Abdominal pains are somewhat improved.  No recent BMs.  Voiding with female condom catheter  Objective: Vitals:   04/15/17 1117 04/15/17 1149 04/15/17 1200 04/15/17 1206  BP:    (!) 144/94  Pulse:      Resp: (!) 30  (!) 21 (!) 22  Temp:  98.8 F (37.1 C)    TempSrc:  Oral    SpO2: 99%  99%   Weight:      Height:        Intake/Output Summary (Last 24 hours) at 04/15/17 1524 Last data filed at 04/15/17 1200  Gross per 24 hour  Intake          2883.33 ml  Output             1650 ml  Net          1233.33 ml   Filed Weights   04/12/17 1832 04/13/17 0251  Weight: 99.3 kg (219 lb) 99.1 kg (218  lb 7.6 oz)    Examination:  General exam:  Adult female, tachypneic to 20s-30s  HEENT:  NCAT, MMM Respiratory system: diminished bilateral BS, no focal rales, rhonchi.  Faint wheeze.   Cardiovascular system: tachycardic to 130s. 2/6 systolic murmur  Warm extremities Gastrointestinal system:  Hypoactive bowel sounds, soft, moderately distended, TTP in epigastrium, left and right upper quadrants without rebound or guarding MSK:  Normal tone and bulk, mild bilateral pedal edema Neuro:  Grossly intact    Data Reviewed: I have personally reviewed following labs and imaging studies  CBC:  Recent Labs Lab 04/12/17 1834 04/12/17 1858 04/13/17 0432 04/14/17 0429 04/15/17 0507  WBC  --  14.7* 12.8* 16.2* 16.2*  NEUTROABS 10.8*  --    --   --  12.6*  HGB  --  14.1 16.2* 15.0 11.8*  HCT  --  42.9 49.2* 46.5* 37.3  MCV  --  78.3 77.2* 80.4 81.8  PLT  --  391 364 248 314   Basic Metabolic Panel:  Recent Labs Lab 04/12/17 1858 04/13/17 0432 04/14/17 0429 04/15/17 0507 04/15/17 1035  NA 143 140 139 141 140  K 2.8* 3.2* 4.5 4.4 4.0  CL 107 103 110 112* 109  CO2 25 22 20* 25 26  GLUCOSE 204* 235* 261* 152* 169*  BUN 9 8 15 15 13   CREATININE 0.59 0.56 0.85 0.82 0.83  CALCIUM 10.0 9.1 8.2* 8.4* 8.5*   GFR: Estimated Creatinine Clearance: 104.2 mL/min (by C-G formula based on SCr of 0.83 mg/dL). Liver Function Tests:  Recent Labs Lab 04/12/17 1858 04/13/17 0432 04/14/17 0429 04/15/17 0507 04/15/17 1035  AST 332* 469* 123* 50* 46*  ALT 263* 530* 277* 145* 139*  ALKPHOS 94 104 74 55 56  BILITOT 2.3* 4.0* 2.3* 2.2* 2.4*  PROT 7.9 7.5 5.7* 5.5* 5.8*  ALBUMIN 4.6 4.3 2.9* 2.6* 2.7*    Recent Labs Lab 04/12/17 1858 04/13/17 0432 04/14/17 0429 04/15/17 0507  LIPASE 6,774* 1,167* 977* 354*   No results for input(s): AMMONIA in the last 168 hours. Coagulation Profile: No results for input(s): INR, PROTIME in the last 168 hours. Cardiac Enzymes: No results for input(s): CKTOTAL, CKMB, CKMBINDEX, TROPONINI in the last 168 hours. BNP (last 3 results) No results for input(s): PROBNP in the last 8760 hours. HbA1C: No results for input(s): HGBA1C in the last 72 hours. CBG:  Recent Labs Lab 04/14/17 1949 04/15/17 0029 04/15/17 0306 04/15/17 0736 04/15/17 1147  GLUCAP 177* 147* 130* 126* 130*   Lipid Profile:  Recent Labs  04/13/17 1630  CHOL 156  HDL 35*  LDLCALC 101*  TRIG 101  CHOLHDL 4.5   Thyroid Function Tests:  Recent Labs  04/12/17 1859  TSH 1.526   Anemia Panel: No results for input(s): VITAMINB12, FOLATE, FERRITIN, TIBC, IRON, RETICCTPCT in the last 72 hours. Urine analysis:    Component Value Date/Time   COLORURINE YELLOW 04/13/2017 0550   APPEARANCEUR CLEAR  04/13/2017 0550   LABSPEC >1.046 (H) 04/13/2017 0550   PHURINE 6.0 04/13/2017 0550   GLUCOSEU 50 (A) 04/13/2017 0550   HGBUR NEGATIVE 04/13/2017 0550   BILIRUBINUR NEGATIVE 04/13/2017 0550   BILIRUBINUR neg 10/24/2013 1255   KETONESUR 20 (A) 04/13/2017 0550   PROTEINUR 30 (A) 04/13/2017 0550   UROBILINOGEN 1.0 10/24/2013 1255   UROBILINOGEN 0.2 10/21/2013 0035   NITRITE NEGATIVE 04/13/2017 0550   LEUKOCYTESUR NEGATIVE 04/13/2017 0550   Sepsis Labs: @LABRCNTIP (procalcitonin:4,lacticidven:4)  ) Recent Results (from the past 240 hour(s))  MRSA PCR  Screening     Status: None   Collection Time: 04/13/17  2:50 AM  Result Value Ref Range Status   MRSA by PCR NEGATIVE NEGATIVE Final    Comment:        The GeneXpert MRSA Assay (FDA approved for NASAL specimens only), is one component of a comprehensive MRSA colonization surveillance program. It is not intended to diagnose MRSA infection nor to guide or monitor treatment for MRSA infections.   Culture, blood (x 2)     Status: None (Preliminary result)   Collection Time: 04/14/17 11:00 PM  Result Value Ref Range Status   Specimen Description SITE NOT SPECIFIED  Final   Special Requests Blood Culture adequate volume  Final   Culture NO GROWTH < 12 HOURS  Final   Report Status PENDING  Incomplete  Culture, blood (x 2)     Status: None (Preliminary result)   Collection Time: 04/14/17 11:06 PM  Result Value Ref Range Status   Specimen Description SITE NOT SPECIFIED  Final   Special Requests Blood Culture adequate volume  Final   Culture NO GROWTH < 12 HOURS  Final   Report Status PENDING  Incomplete      Radiology Studies: Dg Chest Port 1 View  Result Date: 04/14/2017 CLINICAL DATA:  Sepsis. EXAM: PORTABLE CHEST 1 VIEW COMPARISON:  04/12/2017 FINDINGS: 1217 hours. Markedly low lung volumes. The cardio pericardial silhouette is enlarged. Vascular congestion with interstitial opacity the basilar airspace disease suggests  pulmonary edema. A component of the basilar opacity may be atelectatic. Question small bilateral pleural effusions. The visualized bony structures of the thorax are intact. Telemetry leads overlie the chest. IMPRESSION: Markedly low volume film with cardiomegaly, vascular congestion and probable edema. Bibasilar atelectasis with probable small bilateral effusions. Electronically Signed   By: Misty Stanley M.D.   On: 04/14/2017 12:36   Dg Chest Port 1v Same Day  Result Date: 04/14/2017 CLINICAL DATA:  36 y/o  F; PICC line placement. EXAM: PORTABLE CHEST 1 VIEW COMPARISON:  04/14/2017 chest radiograph FINDINGS: Stable cardiac silhouette given projection and technique. Low lung volumes accentuate pulmonary markings. Bibasilar opacities probably represent atelectasis and possible small effusions. Right PICC line tip projects over the cavoatrial junction. No acute osseous abnormality is evident. IMPRESSION: Right PICC line tip projects over cavoatrial junction. Stable low lung volumes and bibasilar opacities probably representing atelectasis and effusions. Electronically Signed   By: Kristine Garbe M.D.   On: 04/14/2017 22:01     Scheduled Meds: . ALPRAZolam  0.25 mg Oral BID BM & HS  . chlorhexidine  15 mL Mouth Rinse BID  . heparin  7,500 Units Subcutaneous Q8H  . HYDROmorphone   Intravenous Q4H  . insulin aspart  0-9 Units Subcutaneous Q4H  . labetalol  15 mg Intravenous Q8H  . mouth rinse  15 mL Mouth Rinse q12n4p  . ondansetron (ZOFRAN) IV  4 mg Intravenous TID WC & HS  . pantoprazole (PROTONIX) IV  40 mg Intravenous BID AC  . senna-docusate  1 tablet Oral BID   Continuous Infusions: . lactated ringers 125 mL/hr at 04/15/17 1114     LOS: 2 days    Time spent: 30 min    Janece Canterbury, MD [facility] Pager [phone]  If 7PM-7AM, please contact night-coverage [email or web address] Password [facility] 04/15/2017, 3:24 PM

## 2017-04-15 NOTE — Progress Notes (Addendum)
Cardiology following bp's and heart rates periphrelly. BP's reasonably controlled on IV labetalol, continued sinus tachycardia in setting of severe pancreatitis. No role for aggressive rate control in this scenario as this is a physiological respone, as pancreatitis is treated heart rates will improve. Continue IV therapy while patient is NPO.  CXR with possible congestion, she is + 9 liters according to charting, agree with cutting back on IVFs. Sats high 90s on 2L [place], not severely volume overloaded by exam, continue to monitor.  Once heart rates slow down can consider echo if fluid remains an issue.    Zandra Abts MD

## 2017-04-16 DIAGNOSIS — K851 Biliary acute pancreatitis without necrosis or infection: Secondary | ICD-10-CM

## 2017-04-16 DIAGNOSIS — D649 Anemia, unspecified: Secondary | ICD-10-CM

## 2017-04-16 LAB — CBC WITH DIFFERENTIAL/PLATELET
Band Neutrophils: 0 %
Basophils Absolute: 0 10*3/uL (ref 0.0–0.1)
Basophils Relative: 0 %
Blasts: 0 %
Eosinophils Absolute: 0 10*3/uL (ref 0.0–0.7)
Eosinophils Relative: 0 %
HCT: 27.1 % — ABNORMAL LOW (ref 36.0–46.0)
Hemoglobin: 8.7 g/dL — ABNORMAL LOW (ref 12.0–15.0)
Lymphocytes Relative: 20 %
Lymphs Abs: 2.8 10*3/uL (ref 0.7–4.0)
MCH: 26.1 pg (ref 26.0–34.0)
MCHC: 32.1 g/dL (ref 30.0–36.0)
MCV: 81.4 fL (ref 78.0–100.0)
Metamyelocytes Relative: 0 %
Monocytes Absolute: 0.3 10*3/uL (ref 0.1–1.0)
Monocytes Relative: 2 %
Myelocytes: 0 %
Neutro Abs: 10.7 10*3/uL — ABNORMAL HIGH (ref 1.7–7.7)
Neutrophils Relative %: 76 %
Other: 0 %
Platelets: 161 10*3/uL (ref 150–400)
Promyelocytes Absolute: 2 %
RBC: 3.33 MIL/uL — ABNORMAL LOW (ref 3.87–5.11)
RDW: 14.3 % (ref 11.5–15.5)
WBC: 13.8 10*3/uL — ABNORMAL HIGH (ref 4.0–10.5)
nRBC: 0 /100 WBC

## 2017-04-16 LAB — URINALYSIS, ROUTINE W REFLEX MICROSCOPIC
Bacteria, UA: NONE SEEN
Bilirubin Urine: NEGATIVE
Glucose, UA: NEGATIVE mg/dL
Ketones, ur: NEGATIVE mg/dL
Leukocytes, UA: NEGATIVE
Nitrite: NEGATIVE
Protein, ur: NEGATIVE mg/dL
Specific Gravity, Urine: 1.006 (ref 1.005–1.030)
pH: 7 (ref 5.0–8.0)

## 2017-04-16 LAB — LIPASE, BLOOD: Lipase: 76 U/L — ABNORMAL HIGH (ref 11–51)

## 2017-04-16 LAB — COMPREHENSIVE METABOLIC PANEL
ALT: 78 U/L — ABNORMAL HIGH (ref 14–54)
AST: 28 U/L (ref 15–41)
Albumin: 2.2 g/dL — ABNORMAL LOW (ref 3.5–5.0)
Alkaline Phosphatase: 42 U/L (ref 38–126)
Anion gap: 8 (ref 5–15)
BUN: 8 mg/dL (ref 6–20)
CO2: 25 mmol/L (ref 22–32)
Calcium: 7.7 mg/dL — ABNORMAL LOW (ref 8.9–10.3)
Chloride: 107 mmol/L (ref 101–111)
Creatinine, Ser: 0.56 mg/dL (ref 0.44–1.00)
GFR calc Af Amer: >60 mL/min (ref >60)
GFR calc non Af Amer: >60 mL/min (ref >60)
Glucose, Bld: 116 mg/dL — ABNORMAL HIGH (ref 65–99)
Potassium: 3.4 mmol/L — ABNORMAL LOW (ref 3.5–5.1)
Sodium: 140 mmol/L (ref 135–145)
Total Bilirubin: 1.5 mg/dL — ABNORMAL HIGH (ref 0.3–1.2)
Total Protein: 4.8 g/dL — ABNORMAL LOW (ref 6.5–8.1)

## 2017-04-16 LAB — HEMOGLOBIN AND HEMATOCRIT, BLOOD
HCT: 31 % — ABNORMAL LOW (ref 36.0–46.0)
Hemoglobin: 9.9 g/dL — ABNORMAL LOW (ref 12.0–15.0)

## 2017-04-16 LAB — GLUCOSE, CAPILLARY
Glucose-Capillary: 154 mg/dL — ABNORMAL HIGH (ref 65–99)
Glucose-Capillary: 136 mg/dL — ABNORMAL HIGH (ref 65–99)
Glucose-Capillary: 135 mg/dL — ABNORMAL HIGH (ref 65–99)
Glucose-Capillary: 129 mg/dL — ABNORMAL HIGH (ref 65–99)

## 2017-04-16 MED ORDER — LEVALBUTEROL HCL 0.63 MG/3ML IN NEBU
0.6300 mg | INHALATION_SOLUTION | RESPIRATORY_TRACT | Status: DC | PRN
  Administered 2017-04-16 – 2017-04-17 (×2): 0.63 mg via RESPIRATORY_TRACT
  Filled 2017-04-16 (×2): qty 3

## 2017-04-16 MED ORDER — SODIUM CHLORIDE 0.9% FLUSH
10.0000 mL | INTRAVENOUS | Status: DC | PRN
Start: 2017-04-16 — End: 2017-04-29
  Administered 2017-04-17 – 2017-04-28 (×2): 10 mL
  Filled 2017-04-16 (×2): qty 40

## 2017-04-16 MED ORDER — POTASSIUM CHLORIDE CRYS ER 20 MEQ PO TBCR
40.0000 meq | EXTENDED_RELEASE_TABLET | Freq: Every day | ORAL | Status: DC
  Administered 2017-04-16: 40 meq via ORAL
  Filled 2017-04-16: qty 2

## 2017-04-16 MED ORDER — SODIUM CHLORIDE 0.9% FLUSH
10.0000 mL | Freq: Two times a day (BID) | INTRAVENOUS | Status: DC
  Administered 2017-04-16 – 2017-04-24 (×14): 10 mL

## 2017-04-16 MED ORDER — PANTOPRAZOLE SODIUM 40 MG PO TBEC
40.0000 mg | DELAYED_RELEASE_TABLET | Freq: Two times a day (BID) | ORAL | Status: DC
  Administered 2017-04-16 – 2017-04-17 (×3): 40 mg via ORAL
  Filled 2017-04-16 (×3): qty 1

## 2017-04-16 MED ORDER — LABETALOL HCL 5 MG/ML IV SOLN
20.0000 mg | Freq: Three times a day (TID) | INTRAVENOUS | Status: DC
  Administered 2017-04-16 – 2017-04-19 (×8): 20 mg via INTRAVENOUS
  Filled 2017-04-16 (×8): qty 4

## 2017-04-16 MED ORDER — FUROSEMIDE 10 MG/ML IJ SOLN
40.0000 mg | Freq: Once | INTRAMUSCULAR | Status: AC
  Administered 2017-04-16: 40 mg via INTRAVENOUS
  Filled 2017-04-16: qty 4

## 2017-04-16 MED ORDER — CHLORHEXIDINE GLUCONATE CLOTH 2 % EX PADS
6.0000 | MEDICATED_PAD | Freq: Every day | CUTANEOUS | Status: DC
  Administered 2017-04-16 – 2017-04-28 (×13): 6 via TOPICAL

## 2017-04-16 NOTE — Progress Notes (Signed)
Physical Therapy Treatment Patient Details Name: Kaylee Montgomery MRN: [id number] DOB: [birth year] Today's Date: 04/16/2017    History of Present Illness Bethannie Iglehart is a 36yo black female who comes to ED with severe LUQ pain, N/V. She is noted to have acute pancreatitis, has since been with tachycardia in 120-140s, and HTN. As of 6/26, CXR revealing pulmonary edema, vascular congestion, cardiomegaly, and basilar atelectasis. PMH: pituitary tumor, mirgraine, OAS c CPAP non-compliance. 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     PT Comments    Pt is up to chair when PT arrived, assisted with nursing to get her to [facility] and control standing balance to clean up after.  Pt is managing with her eyes closed most of the session, but more alert once sitting on BSC.  Transitioned to bed with pulse then at 136 so minimized her work to walk.  Pt is progressing well and note her motivation to work with all staff to increase personal mobility.  Focus on strengthening and standing balance control with acute therapy.   Follow Up Recommendations  Home health PT;Supervision for mobility/OOB     Equipment Recommendations  Rolling walker with 5" wheels    Recommendations for Other Services       Precautions / Restrictions Precautions Precautions: Fall (ICU monitors) Precaution Comments: bed rest when HR is above 130 Restrictions Weight Bearing Restrictions: No    Mobility  Bed Mobility Overal bed mobility: Needs Assistance Bed Mobility: Sit to Supine       Sit to supine: Mod assist   General bed mobility comments: mainly needed assistance with legs to bed then repositioned at shoulders  Transfers Overall transfer level: Needs assistance Equipment used: 2 person hand held assist Transfers: Sit to/from Stand;Stand Pivot Transfers Sit to Stand: Min assist;From elevated surface Stand pivot transfers: Min assist;+2 physical  assistance;+2 safety/equipment;From elevated surface       General transfer comment: used HHA due to lethargy  Ambulation/Gait Ambulation/Gait assistance: Min assist;+2 physical assistance;+2 safety/equipment Ambulation Distance (Feet): 5 Feet         General Gait Details: HR 136 so did not ambulate more than a few steps to chair and bed   Stairs            Wheelchair Mobility    Modified Rankin (Stroke Patients Only)       Balance   Sitting-balance support: Single extremity supported;Feet supported Sitting balance-Leahy Scale: Good     Standing balance support: During functional activity Standing balance-Leahy Scale: Poor                              Cognition Arousal/Alertness: Lethargic;Suspect due to medications Behavior During Therapy: Flat affect Overall Cognitive Status: Within Functional Limits for tasks assessed                 [place] Levels of Cognitive Functioning [place] [employer] Scales of Cognitive Functioning: Automatic/appropriate                      Exercises General Exercises - Lower Extremity Ankle Circles/Pumps: AAROM;Both;10 reps Hip ABduction/ADduction: AAROM;Both;10 reps    General Comments        Pertinent Vitals/Pain Pain Assessment: Faces Faces Pain Scale: Hurts a little bit Pain Location: back Pain Intervention(s): Monitored during session;Repositioned;Premedicated before session (nursing had pain controlled prior to PT arrival)    Home  Living                      Prior Function            PT Goals (current goals can now be found in the care plan section) Acute Rehab PT Goals Patient Stated Goal: to move to bed after [facility] and practice standing Progress towards PT goals: Progressing toward goals    Frequency    Min 2X/week      PT Plan Current plan remains appropriate    Co-evaluation              AM-PAC PT "6 Clicks" Daily Activity  Outcome Measure  Difficulty  turning over in bed (including adjusting bedclothes, sheets and blankets)?: Total Difficulty moving from lying on back to sitting on the side of the bed? : Total Difficulty sitting down on and standing up from a chair with arms (e.g., wheelchair, bedside commode, etc,.)?: Total Help needed moving to and from a bed to chair (including a wheelchair)?: A Lot Help needed walking in hospital room?: A Lot Help needed climbing 3-5 steps with a railing? : Total 6 Click Score: 8    End of Session Equipment Utilized During Treatment: Gait belt;Oxygen Activity Tolerance: Patient tolerated treatment well;Patient limited by fatigue;Patient limited by lethargy Patient left: in bed;with call bell/phone within reach;with nursing/sitter in room;with family/visitor present (PCA available to pt) Nurse Communication: Mobility status PT Visit Diagnosis: Muscle weakness (generalized) (M62.81);Difficulty in walking, not elsewhere classified (R26.2)     Time: 2202-5427 PT Time Calculation (min) (ACUTE ONLY): 22 min  Charges:  $Therapeutic Activity: 8-22 mins                    G Codes:  Functional Assessment Tool Used: AM-PAC 6 Clicks Basic Mobility     Ramond Dial 04/16/2017, 3:03 PM   3:05 PM, 04/16/17 Mee Hives, PT, MS Physical Therapist - [place] [phone] (Office)

## 2017-04-16 NOTE — Progress Notes (Signed)
[place] for PCA Indication: pain control, pancreatitis  Allergies  Allergen Reactions  . Bromocriptine Mesylate Nausea Only  . Depo-Medrol [Methylprednisolone Acetate] Itching and Rash  . Imitrex [Sumatriptan] Rash   Patient Measurements: Height: 5\' 3"  (160 cm) Weight: 242 lb 1 oz (109.8 kg) IBW/kg (Calculated) : 52.4  Vital Signs: Temp: 99.2 F (37.3 C) (06/28 0746) Temp Source: Oral (06/28 0746) BP: 112/72 (06/28 0700) Intake/Output from previous day: 06/27 0701 - 06/28 0700 In: 3594.9 [P.O.:550; I.V.:3044.9] Out: 3150 [Urine:3150] Intake/Output from this shift: No intake/output data recorded.  Labs:  Recent Labs  04/13/17 1619  04/14/17 0429 04/15/17 0507 04/15/17 1035 04/16/17 0623  WBC  --   --  16.2* 16.2*  --  13.8*  HGB  --   --  15.0 11.8*  --  8.7*  HCT  --   --  46.5* 37.3  --  27.1*  PLT  --   --  248 190  --  161  CREATININE  --   < > 0.85 0.82 0.83 0.56  ALBUMIN  --   < > 2.9* 2.6* 2.7* 2.2*  PROT  --   < > 5.7* 5.5* 5.8* 4.8*  AST  --   < > 123* 50* 46* 28  ALT  --   < > 277* 145* 139* 78*  ALKPHOS  --   < > 74 55 56 42  BILITOT  --   < > 2.3* 2.2* 2.4* 1.5*  BILIDIR 1.2*  --   --   --   --   --   < > = values in this interval not displayed. Estimated Creatinine Clearance: 114.6 mL/min (by C-G formula based on SCr of 0.56 mg/dL).  Medical History: Past Medical History:  Diagnosis Date  . Breast mass 02/2017  . Dental crown present   . Hypertension    states BP fluctuates; has been on med. x 1 yr.  . Migraines   . Pituitary adenoma ([place])   . Seasonal allergies   . Sleep apnea    no CPAP use in > 6 mos.   Medications:  Scheduled:  . ALPRAZolam  0.25 mg Oral BID BM & HS  . chlorhexidine  15 mL Mouth Rinse BID  . Chlorhexidine Gluconate Cloth  6 each Topical Daily  . heparin  7,500 Units Subcutaneous Q8H  . HYDROmorphone   Intravenous Q4H  . insulin aspart  0-5 Units Subcutaneous QHS  .  insulin aspart  0-9 Units Subcutaneous TID WC  . labetalol  15 mg Intravenous Q8H  . mouth rinse  15 mL Mouth Rinse q12n4p  . ondansetron (ZOFRAN) IV  4 mg Intravenous TID WC & HS  . pantoprazole (PROTONIX) IV  40 mg Intravenous BID AC  . senna-docusate  1 tablet Oral BID  . sodium chloride flush  10-40 mL Intracatheter Q12H   Assessment: 36yo female with pancreatitis and pain difficult to control.  Full dose dilaudid PCA started yesterday. She awoke last night and pain was out of control.  Her mother stated she was unable to go back to sleep. Pain is now a 6 per patient.  She is drowsy this am. She is also receiving xanax 0.25 mg po tid.  This may also be contributing to her drowsiness.  Shortness of breath better per MD.  Goal of Therapy:  Improved pain control  Plan:  Consider decreasing PCA to low dose if drowsiness persists Consider decreasing xanax dose.  Could  add low dose continuous infusion or scheduled low dose IV overnight to prevent waking up in pain Monitor labs, pain control, vitals and respirations  Ki Luckman Poteet 04/16/2017,10:42 AM

## 2017-04-16 NOTE — Progress Notes (Signed)
PROGRESS NOTE  Kaylee Montgomery  HCW:[id number] DOB: 1980/10/30 DOA: 04/12/2017 PCP: [name], MD  Brief Narrative:   36 year old female with morbid obesity, hypertension, pituitary tumor, OSA not complainant with CPAP who presented to [facility] with worsening abdominal pain in the epigastric area, nausea and vomiting for past few days piror to the admission. Pain was 10/10 in intensity at worst, burning like sensation and associated with ongoing nausea and vomiting. She was found to have elevated LFT's and lipase in 6000 range. Her CT and abd US showed acute pancreatitis.   Assessment & Plan:   Principal Problem:   Acute pancreatitis Active Problems:   PITUITARY ADENOMA   Morbid obesity ([place])   Seasonal and perennial allergic rhinitis   Prediabetes   Obstructive sleep apnea   S/P laparoscopic sleeve gastrectomy   Transaminitis   Anemia  SIRS secondary to severe acute idiopathic pancreatitis with ongoing tachycardia and pulmonary edema  - Abd US showed enlarged pancreas consistent with pancreatitis. No definite gallstones or sonographic Murphy sign.  - CT abdomen showed enlarged edematous pancreas with moderate-to-marked fluid and edema surrounding the pancreas consistent with acute pancreatitis - Lipase and LFTs trending down - FLD -  Continue zosyn -  Lactic acid yesterday was down to 1.5, uop high, and BUN down to 8 so likely okay to decrease rate of fluids.  Goal uop between 0.5-104ml/kg/h.  Drank > 522mL so far today.  Decrease to 67ml/h  Acute hypoxic respiratory failure likely secondary to ARDS/third spacing from pancreatitis -  Did not tolerate CPAP or bipap -  Encouraged incentive spirometry    Hypokalemia, recurrent - Due to GI losses - additional potassium in IVF   Persistent sinus Tachycardia / Uncontrolled hypertension due to pain and acute pancreatitis - Appreciate cardiology assistance - increase labetalol to 20 mg IV Q 8 hours with prn doses    Morbid  obesity due to excess calories ([facility]) - Body mass index is 38.7 kg/m. - Counseled on nutrition  - S/P laparoscopic sleeve gastrectomy  Dark urine, likely from mild hemoglobinuria from recently elevated bilirubin and ATN from moderate to severe pancreatitis -  Check UA -  Doubt kidney stones since none seen on CT -  Doubt bladder or renal cancer -  No need for urology consultation at this time -  Already on antibiotics, so doubt UTI -  Amount of blood loss from urine is trivial and does not explain drop in hemoglobin -  Moderate to high risk for DVT due to pancreatitis so will not stop DVT proph at this time  Normocytic anemia, acute phase reactant, no need for blood transfusion at this time.  If bleeding, would be concerned for hemorrhage near pancreas which would require repeat CT scan.   -  Iron studies will be abnl in setting of inflammation so will defer at this time -  check B12, folate -  TSH nwl -  Occult stool may be positive secondary to pancreatitis and not accurate at this time.  Monitor for gross blood in stools -  Repeat hgb in AM  DVT prophylaxis:  heparin Code Status:  full Family Communication:  Patient and her mother Disposition Plan:  Pending.  Still serious condition with ongoing tachycardia   Consultants:   GI  Cardiology  Procedures:  none  Antimicrobials:  Anti-infectives    Start     Dose/Rate Route Frequency Ordered Stop   04/14/17 1200  piperacillin-tazobactam (ZOSYN) IVPB 3.375 g  Status:  Discontinued     3.375 g 12.5 mL/hr over 240 Minutes Intravenous Every 8 hours 04/14/17 1142 04/15/17 1424       Subjective: SOB, possibly worse today.  Had uncontrolled pain overnight because she slept and did not press the button on her PCA.  Ongoing/worsening swelling.  No BMS.  Sat in chair for 3 hours yesterday.  Not pulling much on her incentive spirometer.  Did not tolerate CPAP.    Objective: Vitals:   04/16/17 1106 04/16/17 1134 04/16/17 1200  04/16/17 1300  BP:   (!) 150/98 (!) 158/94  Pulse:      Resp: 16 18 (!) 31 19  Temp: 100 F (37.8 C)     TempSrc: Oral     SpO2:  96%    Weight:      Height:        Intake/Output Summary (Last 24 hours) at 04/16/17 1420 Last data filed at 04/16/17 2376  Gross per 24 hour  Intake          2621.97 ml  Output             1500 ml  Net          1121.97 ml   Filed Weights   04/12/17 1832 04/13/17 0251 04/16/17 0625  Weight: 99.3 kg (219 lb) 99.1 kg (218 lb 7.6 oz) 109.8 kg (242 lb 1 oz)    Examination:  General exam:  Adult female, falling asleep intermittently during interview, CO2 on PCA in the 40s  HEENT:  Normocephalic, atraumatic, mouth appears dry.  Drinking OJ Respiratory system:  Diminished bilateral breath sounds, wheezing, no rales or rhonchi Cardiovascular system: Still tachycardic to the 283T, 2/6 systolic murmur, warm extremities Gastrointestinal system:  NABS, soft, moderately distended, tender to palpation in the epigastrium, left upper quadrants without rebound or guarding  MSK:  Normal tone and bulk, puffiness of bilateral hands and ankles, no obvious pitting edema Neuro: Grossly moves all extremities    Data Reviewed: I have personally reviewed following labs and imaging studies  CBC:  Recent Labs Lab 04/12/17 1834  04/12/17 1858 04/13/17 0432 04/14/17 0429 04/15/17 0507 04/16/17 0623 04/16/17 1052  WBC  --   --  14.7* 12.8* 16.2* 16.2* 13.8*  --   NEUTROABS 10.8*  --   --   --   --  12.6* 10.7*  --   HGB  --   < > 14.1 16.2* 15.0 11.8* 8.7* 9.9*  HCT  --   < > 42.9 49.2* 46.5* 37.3 27.1* 31.0*  MCV  --   --  78.3 77.2* 80.4 81.8 81.4  --   PLT  --   --  391 364 248 190 161  --   < > = values in this interval not displayed. Basic Metabolic Panel:  Recent Labs Lab 04/13/17 0432 04/14/17 0429 04/15/17 0507 04/15/17 1035 04/16/17 0623  NA 140 139 141 140 140  K 3.2* 4.5 4.4 4.0 3.4*  CL 103 110 112* 109 107  CO2 22 20* 25 26 25   GLUCOSE  235* 261* 152* 169* 116*  BUN 8 15 15 13 8   CREATININE 0.56 0.85 0.82 0.83 0.56  CALCIUM 9.1 8.2* 8.4* 8.5* 7.7*   GFR: Estimated Creatinine Clearance: 114.6 mL/min (by C-G formula based on SCr of 0.56 mg/dL). Liver Function Tests:  Recent Labs Lab 04/13/17 0432 04/14/17 0429 04/15/17 0507 04/15/17 1035 04/16/17 0623  AST 469* 123* 50* 46* 28  ALT 530* 277* 145* 139* 78*  ALKPHOS 104 74 55 56 42  BILITOT 4.0* 2.3* 2.2* 2.4* 1.5*  PROT 7.5 5.7* 5.5* 5.8* 4.8*  ALBUMIN 4.3 2.9* 2.6* 2.7* 2.2*    Recent Labs Lab 04/12/17 1858 04/13/17 0432 04/14/17 0429 04/15/17 0507 04/16/17 0623  LIPASE 6,774* 1,167* 977* 354* 76*   No results for input(s): AMMONIA in the last 168 hours. Coagulation Profile: No results for input(s): INR, PROTIME in the last 168 hours. Cardiac Enzymes: No results for input(s): CKTOTAL, CKMB, CKMBINDEX, TROPONINI in the last 168 hours. BNP (last 3 results) No results for input(s): PROBNP in the last 8760 hours. HbA1C: No results for input(s): HGBA1C in the last 72 hours. CBG:  Recent Labs Lab 04/15/17 1147 04/15/17 1601 04/15/17 2308 04/16/17 0941 04/16/17 1106  GLUCAP 130* 106* 138* 154* 136*   Lipid Profile:  Recent Labs  04/13/17 1630  CHOL 156  HDL 35*  LDLCALC 101*  TRIG 101  CHOLHDL 4.5   Thyroid Function Tests: No results for input(s): TSH, T4TOTAL, FREET4, T3FREE, THYROIDAB in the last 72 hours. Anemia Panel: No results for input(s): VITAMINB12, FOLATE, FERRITIN, TIBC, IRON, RETICCTPCT in the last 72 hours. Urine analysis:    Component Value Date/Time   COLORURINE YELLOW 04/13/2017 0550   APPEARANCEUR CLEAR 04/13/2017 0550   LABSPEC >1.046 (H) 04/13/2017 0550   PHURINE 6.0 04/13/2017 0550   GLUCOSEU 50 (A) 04/13/2017 0550   HGBUR NEGATIVE 04/13/2017 0550   BILIRUBINUR NEGATIVE 04/13/2017 0550   BILIRUBINUR neg 10/24/2013 1255   KETONESUR 20 (A) 04/13/2017 0550   PROTEINUR 30 (A) 04/13/2017 0550   UROBILINOGEN 1.0  10/24/2013 1255   UROBILINOGEN 0.2 10/21/2013 0035   NITRITE NEGATIVE 04/13/2017 0550   LEUKOCYTESUR NEGATIVE 04/13/2017 0550   Sepsis Labs: @LABRCNTIP (procalcitonin:4,lacticidven:4)  ) Recent Results (from the past 240 hour(s))  MRSA PCR Screening     Status: None   Collection Time: 04/13/17  2:50 AM  Result Value Ref Range Status   MRSA by PCR NEGATIVE NEGATIVE Final    Comment:        The GeneXpert MRSA Assay (FDA approved for NASAL specimens only), is one component of a comprehensive MRSA colonization surveillance program. It is not intended to diagnose MRSA infection nor to guide or monitor treatment for MRSA infections.   Culture, blood (x 2)     Status: None (Preliminary result)   Collection Time: 04/14/17 11:00 PM  Result Value Ref Range Status   Specimen Description SITE NOT SPECIFIED  Final   Special Requests Blood Culture adequate volume  Final   Culture NO GROWTH 2 DAYS  Final   Report Status PENDING  Incomplete  Culture, blood (x 2)     Status: None (Preliminary result)   Collection Time: 04/14/17 11:06 PM  Result Value Ref Range Status   Specimen Description SITE NOT SPECIFIED  Final   Special Requests Blood Culture adequate volume  Final   Culture NO GROWTH 2 DAYS  Final   Report Status PENDING  Incomplete      Radiology Studies: Dg Chest Port 1v Same Day  Result Date: 04/14/2017 CLINICAL DATA:  36 y/o  F; PICC line placement. EXAM: PORTABLE CHEST 1 VIEW COMPARISON:  04/14/2017 chest radiograph FINDINGS: Stable cardiac silhouette given projection and technique. Low lung volumes accentuate pulmonary markings. Bibasilar opacities probably represent atelectasis and possible small effusions. Right PICC line tip projects over the cavoatrial junction. No acute osseous abnormality is evident. IMPRESSION: Right PICC line tip projects over cavoatrial junction. Stable low lung volumes  and bibasilar opacities probably representing atelectasis and effusions.  Electronically Signed   By: Kristine Garbe M.D.   On: 04/14/2017 22:01     Scheduled Meds: . ALPRAZolam  0.25 mg Oral BID BM & HS  . chlorhexidine  15 mL Mouth Rinse BID  . Chlorhexidine Gluconate Cloth  6 each Topical Daily  . heparin  7,500 Units Subcutaneous Q8H  . HYDROmorphone   Intravenous Q4H  . insulin aspart  0-5 Units Subcutaneous QHS  . insulin aspart  0-9 Units Subcutaneous TID WC  . labetalol  15 mg Intravenous Q8H  . mouth rinse  15 mL Mouth Rinse q12n4p  . ondansetron (ZOFRAN) IV  4 mg Intravenous TID WC & HS  . pantoprazole (PROTONIX) IV  40 mg Intravenous BID AC  . senna-docusate  1 tablet Oral BID  . sodium chloride flush  10-40 mL Intracatheter Q12H   Continuous Infusions: . lactated ringers 125 mL/hr at 04/16/17 1004     LOS: 3 days    Time spent: 30 min    Janece Canterbury, MD [facility] Pager [phone]  If 7PM-7AM, please contact night-coverage [email or web address] Password [email or web address] 04/16/2017, 2:20 PM

## 2017-04-16 NOTE — Progress Notes (Signed)
Subjective:  Had difficult night. Did not tolerate  CPAP. Pain got out of control while she was sleeping. Feels like she may have a BM today. Tolerating juice this morning.   Objective: Vital signs in last 24 hours: Temp:  [98.6 F (37 C)-100.5 F (38.1 C)] 99.2 F (37.3 C) (06/28 0746) Pulse Rate:  [127-139] 139 (06/27 1430) Resp:  [17-42] 21 (06/28 0746) BP: (112-160)/(63-106) 112/72 (06/28 0700) SpO2:  [95 %-99 %] 95 % (06/28 0400) Weight:  [242 lb 1 oz (109.8 kg)] 242 lb 1 oz (109.8 kg) (06/28 0625) Last BM Date: 04/13/17 General:   Drowsy but conversant. cooperative in NAD Head:  Normocephalic and atraumatic. Eyes:  Sclera clear, no icterus.  Chest: diminished breath sounds in bases.     Heart:  Regular rhythm; no murmurs, clicks, rubs,  or gallops. Tachycardia. Abdomen:  Soft, moderate epigastric tenderness. obese. Normal bowel sounds, without guarding, and without rebound.   Extremities:  Without clubbing, deformity or edema. Neurologic:  Alert and  oriented x4;  grossly normal neurologically. Skin:  Intact without significant lesions or rashes. Psych: drowsy and cooperative. Normal mood and affect.  Intake/Output from previous day: 06/27 0701 - 06/28 0700 In: 3594.9 [P.O.:550; I.V.:3044.9] Out: 3150 [Urine:3150] Intake/Output this shift: No intake/output data recorded.  Lab Results: CBC  Recent Labs  04/14/17 0429 04/15/17 0507 04/16/17 0623  WBC 16.2* 16.2* 13.8*  HGB 15.0 11.8* 8.7*  HCT 46.5* 37.3 27.1*  MCV 80.4 81.8 81.4  PLT 248 190 161   BMET  Recent Labs  04/14/17 0429 04/15/17 0507 04/15/17 1035  NA 139 141 140  K 4.5 4.4 4.0  CL 110 112* 109  CO2 20* 25 26  GLUCOSE 261* 152* 169*  BUN 15 15 13   CREATININE 0.85 0.82 0.83  CALCIUM 8.2* 8.4* 8.5*   LFTs  Recent Labs  04/13/17 1619 04/14/17 0429 04/15/17 0507 04/15/17 1035  BILITOT  --  2.3* 2.2* 2.4*  BILIDIR 1.2*  --   --   --   ALKPHOS  --  74 55 56  AST  --  123* 50* 46*   ALT  --  277* 145* 139*  PROT  --  5.7* 5.5* 5.8*  ALBUMIN  --  2.9* 2.6* 2.7*    Recent Labs  04/14/17 0429 04/15/17 0507 04/16/17 0623  LIPASE 977* 354* 76*   PT/INR No results for input(s): LABPROT, INR in the last 72 hours.    Imaging Studies: Dg Chest 2 View  Result Date: 04/12/2017 CLINICAL DATA:  Patient c/o upper abd pain. Per patient nausea, vomiting w/ bright red blood, and diarrhea. Patient has generalized weakness. History of HTN, Breast mass. EXAM: CHEST  2 VIEW COMPARISON:  01/03/2016 FINDINGS: Cardiac silhouette is top-normal in size. No mediastinal or hilar masses. No evidence of adenopathy. Clear lungs.  No pleural effusion or pneumothorax. Skeletal structures are intact. IMPRESSION: No active cardiopulmonary disease. Electronically Signed   By: Lajean Manes M.D.   On: 04/12/2017 23:52   US Abdomen Complete  Result Date: 04/13/2017 CLINICAL DATA:  Pancreatitis, nausea, vomiting, diarrhea, breast mass EXAM: ABDOMEN ULTRASOUND COMPLETE COMPARISON:  CT abdomen pelvis 04/12/2017 FINDINGS: Gallbladder: Dependent nonshadowing echogenic material within gallbladder lumen question sludge. Gallbladder wall upper normal thickness 3 mm diameter. No sonographic Murphy sign or definite shadowing calculi. Common bile duct: Diameter: 3 mm diameter , normal Liver: Echogenic, likely fatty infiltration, though this can be seen with cirrhosis and certain infiltrative disorders. Area of focally decreased attenuation within  liver adjacent to gallbladder fossa 3.4 x 2.9 x 3.5 cm question fatty sparing versus hypoechoic mass; no definite mass lesion seen at this site on prior CT, favor sparing. IVC: Short segment of intrahepatic IVC normal appearance, intrahepatic portion obscured by bowel gas Pancreas: Thickened and hypoechoic consistent with pancreatitis features seen on CT. No discrete mass, pseudocyst, or ductal dilatation. No shadowing calcification. Portions of the tail and uncinate inner  obscured by bowel gas. Spleen: Normal appearance, 5.7 cm length Right Kidney: Length: 11.1 cm. Normal morphology without mass or hydronephrosis. Left Kidney: Length: 11.8 cm. Normal morphology without mass or hydronephrosis. Abdominal aorta: Normal caliber Other findings: No free fluid IMPRESSION: Enlarged pancreas consistent with pancreatitis features seen on CT. Incomplete visualization of distal pancreatic tail and portions of uncinate. Probable sludge within gallbladder with upper normal gallbladder wall thickness. No definite gallstones or sonographic Murphy sign. Hypoechoic region within liver 3.5 cm greatest size, question focal sparing within a background of fatty infiltration ; no definite hepatic mass was seen at this site on the preceding CT. Electronically Signed   By: Lavonia Dana M.D.   On: 04/13/2017 10:14   Ct Abdomen Pelvis W Contrast  Result Date: 04/13/2017 CLINICAL DATA:  Upper abdominal pain, nausea vomiting and diarrhea EXAM: CT ABDOMEN AND PELVIS WITH CONTRAST TECHNIQUE: Multidetector CT imaging of the abdomen and pelvis was performed using the standard protocol following bolus administration of intravenous contrast. CONTRAST:  157mL ISOVUE-300 IOPAMIDOL (ISOVUE-300) INJECTION 61% COMPARISON:  10/24/2013, mammogram 03/04/2017 FINDINGS: Lower chest: Lung bases demonstrate no acute consolidation or pleural effusion. Mild cardiomegaly. Partially visualized low-density lesion in the lateral aspect of left breast, likely corresponding to history of recent excision. Hepatobiliary: Mild steatosis. No biliary dilatation. Small amount of pericholecystic fluid. No calcified gallstones. Pancreas: Enlarged and edematous. Moderate-to-marked peripancreatic fluid and inflammatory change consistent with acute pancreatitis. No definite focal fluid collections at the pancreas bed. Spleen: Normal in size without focal abnormality. Adrenals/Urinary Tract: Adrenal glands are unremarkable. Kidneys are normal,  without renal calculi, focal lesion, or hydronephrosis. Bladder is unremarkable. Stomach/Bowel: Postsurgical changes of the stomach. Appendix appears normal. No evidence of bowel wall thickening, distention, or inflammatory changes. Vascular/Lymphatic: Normal portal vein and splenic vein enhancement. Non aneurysmal aorta. No significantly enlarged lymph nodes. Reproductive: Uterus and bilateral adnexa are unremarkable. Other: Small free fluid in the pelvis. Small to moderate fluid in the upper abdomen. No free air. Musculoskeletal: No acute or significant osseous findings. IMPRESSION: 1. Enlarged edematous pancreas with moderate-to-marked fluid and edema surrounding the pancreas consistent with acute pancreatitis. No focal or organized fluid collections are seen at this time. 2. Small amount of pericholecystic fluid, uncertain if this is related to generalized fluid in the upper abdomen from pancreatitis or if this is due to inflammation of the gallbladder. Suggest correlation with gallbladder ultrasound. 3. Small free fluid in the abdomen and pelvis. 4. Fatty liver 5. Focal hypodensity in the lateral aspect of the left breast, felt related to history of recent breast excision. Electronically Signed   By: Donavan Foil M.D.   On: 04/13/2017 00:20   Dg Chest Port 1 View  Result Date: 04/14/2017 CLINICAL DATA:  Sepsis. EXAM: PORTABLE CHEST 1 VIEW COMPARISON:  04/12/2017 FINDINGS: 1217 hours. Markedly low lung volumes. The cardio pericardial silhouette is enlarged. Vascular congestion with interstitial opacity the basilar airspace disease suggests pulmonary edema. A component of the basilar opacity may be atelectatic. Question small bilateral pleural effusions. The visualized bony structures of the thorax are intact.  Telemetry leads overlie the chest. IMPRESSION: Markedly low volume film with cardiomegaly, vascular congestion and probable edema. Bibasilar atelectasis with probable small bilateral effusions.  Electronically Signed   By: Misty Stanley M.D.   On: 04/14/2017 12:36   Dg Chest Port 1v Same Day  Result Date: 04/14/2017 CLINICAL DATA:  36 y/o  F; PICC line placement. EXAM: PORTABLE CHEST 1 VIEW COMPARISON:  04/14/2017 chest radiograph FINDINGS: Stable cardiac silhouette given projection and technique. Low lung volumes accentuate pulmonary markings. Bibasilar opacities probably represent atelectasis and possible small effusions. Right PICC line tip projects over the cavoatrial junction. No acute osseous abnormality is evident. IMPRESSION: Right PICC line tip projects over cavoatrial junction. Stable low lung volumes and bibasilar opacities probably representing atelectasis and effusions. Electronically Signed   By: Kristine Garbe M.D.   On: 04/14/2017 22:01  [2 weeks]   Assessment: 36 year old female admitted with severe pancreatitis, with continued improvement in LFTs, lipase, and leukocytosis. Tmax 100.5. Overall, pain is slowly improving since admission but became uncontrolled while she was asleep last night and got behind on her pain medication. Finally pain is more tolerable. Tachycardia persists and is multifactorial in this setting. Cardiology following patient. Tolerated jello yesterday, prefers the kind you buy in the cafeteria. Bites of oatmeal this morning. SOB somewhat better. Chest xray yesterday with probable edema. IVFs at 125cc/hr. She was given one dose of Lasix yesterday. Weight is up 23 pounds in four days according to today's weight, ?accuracy given no significant peripheral edema noted.  Hgb down 3 grams in past 24 hours. At least in part dilutional but urine is red/orange tinted, collected via female external urinary catheter device. Patient denies menstrual cycles in past 13 years. No BM in 3 days. Discussed with nursing staff. Urine had been tea colored/brown.  Plan: 1. Continue daily weights.  2. Recheck h/h at noon.   3. Evaluation of ?gross hematuria per  attending.   Laureen Ochs. Bernarda Caffey [facility] [phone] 6/28/20189:15 AM     LOS: 3 days    Addendum: repeat Hgb 9.9.  Laureen Ochs. Bernarda Caffey [facility] [phone] 6/28/20181:18 PM

## 2017-04-16 NOTE — Progress Notes (Signed)
Progress Note  Patient Name: Kaylee Montgomery Date of Encounter: 04/16/2017  Primary Cardiologist: Harl Bowie  Subjective   Drowsy, but responsive. Denies severe pain. Did not tolerate CPAP last night. Tachypnea noted.  .   Inpatient Medications    Scheduled Meds: . ALPRAZolam  0.25 mg Oral BID BM & HS  . chlorhexidine  15 mL Mouth Rinse BID  . Chlorhexidine Gluconate Cloth  6 each Topical Daily  . heparin  7,500 Units Subcutaneous Q8H  . HYDROmorphone   Intravenous Q4H  . insulin aspart  0-5 Units Subcutaneous QHS  . insulin aspart  0-9 Units Subcutaneous TID WC  . labetalol  15 mg Intravenous Q8H  . mouth rinse  15 mL Mouth Rinse q12n4p  . ondansetron (ZOFRAN) IV  4 mg Intravenous TID WC & HS  . pantoprazole (PROTONIX) IV  40 mg Intravenous BID AC  . senna-docusate  1 tablet Oral BID  . sodium chloride flush  10-40 mL Intracatheter Q12H   Continuous Infusions: . lactated ringers 125 mL/hr at 04/15/17 2326   PRN Meds: acetaminophen, diphenhydrAMINE **OR** diphenhydrAMINE, HYDROmorphone (DILAUDID) injection, labetalol, naloxone **AND** sodium chloride flush, ondansetron (ZOFRAN) IV, sodium chloride flush   Vital Signs    Vitals:   04/16/17 0500 04/16/17 0600 04/16/17 0625 04/16/17 0700  BP: 135/90 125/84  112/72  Pulse:      Resp: 20 19  19   Temp:  98.6 F (37 C)    TempSrc:  Oral    SpO2:      Weight:   242 lb 1 oz (109.8 kg)   Height:        Intake/Output Summary (Last 24 hours) at 04/16/17 0742 Last data filed at 04/16/17 4481  Gross per 24 hour  Intake          3594.89 ml  Output             3150 ml  Net           444.89 ml   Filed Weights   04/12/17 1832 04/13/17 0251 04/16/17 0625  Weight: 219 lb (99.3 kg) 218 lb 7.6 oz (99.1 kg) 242 lb 1 oz (109.8 kg)    Telemetry    Tachycardic rates in the 120-127 bpm, some NSVT noted.  - Personally Reviewed  Physical Exam   GEN: No acute distress, drowsy.   Neck: No JVD Cardiac: RRR, tachycardic, some  irregular systole,  no murmurs, rubs, or gallops.  Respiratory: Clear to auscultation bilaterally in the upper lobes, poor inspiratory effort.  GI: Soft, mild tender, non-distended Obese. MS: No edema; No deformity. Neuro:  Nonfocal Drowsy but responsive, follows commands.  Psych: Normal affect   Labs    Chemistry Recent Labs Lab 04/14/17 0429 04/15/17 0507 04/15/17 1035  NA 139 141 140  K 4.5 4.4 4.0  CL 110 112* 109  CO2 20* 25 26  GLUCOSE 261* 152* 169*  BUN 15 15 13   CREATININE 0.85 0.82 0.83  CALCIUM 8.2* 8.4* 8.5*  PROT 5.7* 5.5* 5.8*  ALBUMIN 2.9* 2.6* 2.7*  AST 123* 50* 46*  ALT 277* 145* 139*  ALKPHOS 74 55 56  BILITOT 2.3* 2.2* 2.4*  GFRNONAA >60 >60 >60  GFRAA >60 >60 >60  ANIONGAP 9 4* 5     Hematology Recent Labs Lab 04/14/17 0429 04/15/17 0507 04/16/17 0623  WBC 16.2* 16.2* 13.8*  RBC 5.78* 4.56 3.33*  HGB 15.0 11.8* 8.7*  HCT 46.5* 37.3 27.1*  MCV 80.4 81.8 81.4  MCH  26.0 25.9* 26.1  MCHC 32.3 31.6 32.1  RDW 14.2 14.5 14.3  PLT 248 190 161     BNP Recent Labs Lab 04/15/17 0507  BNP 66.0     DDimer No results for input(s): DDIMER in the last 168 hours.   Radiology    Dg Chest Port 1 View  Result Date: 04/14/2017 CLINICAL DATA:  Sepsis. EXAM: PORTABLE CHEST 1 VIEW COMPARISON:  04/12/2017 FINDINGS: 1217 hours. Markedly low lung volumes. The cardio pericardial silhouette is enlarged. Vascular congestion with interstitial opacity the basilar airspace disease suggests pulmonary edema. A component of the basilar opacity may be atelectatic. Question small bilateral pleural effusions. The visualized bony structures of the thorax are intact. Telemetry leads overlie the chest. IMPRESSION: Markedly low volume film with cardiomegaly, vascular congestion and probable edema. Bibasilar atelectasis with probable small bilateral effusions. Electronically Signed   By: Misty Stanley M.D.   On: 04/14/2017 12:36   Dg Chest Port 1v Same Day  Result Date:  04/14/2017 CLINICAL DATA:  36 y/o  F; PICC line placement. EXAM: PORTABLE CHEST 1 VIEW COMPARISON:  04/14/2017 chest radiograph FINDINGS: Stable cardiac silhouette given projection and technique. Low lung volumes accentuate pulmonary markings. Bibasilar opacities probably represent atelectasis and possible small effusions. Right PICC line tip projects over the cavoatrial junction. No acute osseous abnormality is evident. IMPRESSION: Right PICC line tip projects over cavoatrial junction. Stable low lung volumes and bibasilar opacities probably representing atelectasis and effusions. Electronically Signed   By: Kristine Garbe M.D.   On: 04/14/2017 22:01    Cardiac Studies   None  Patient Profile     36 y.o. female admitted with pancreatitis with history of morbid obesity, OSA, pituitary tumor, cardiology is seeing for management of tachycardia and hypertension, felt be to related to physiological response to pancreatitis.   Assessment & Plan    1. Tachycardia: Denies significant pain as she is on PCA hydromorphone injections. Review of telemetry did demonstrate occasional NSVT. Rates are in the 120's at rest. Was unable to keep po metoprolol down due to nausea and vomiting. HR thought to be related to physiological response to pancreatitis.  BP is low with pain control. Consider IV lopressor 5 mg prn HR control, if BP remains stable.   2. Hypertension: BP is labile with pain with known history of HTN. On metoprolol at home. Now on prn labetalol for elevated BP.   3. OSA: Not tolerating CPAP. May need to titrate pressures to allow for better tolerance. Defer to PCP team.   4. Pancreatitis; Labs are improving since admission. She is drowsy with pain medications. Consider decreasing dose and time interval to avoid drowsiness and improve breathing, with more room for BP to become stable, and allow for rate control with metoprolol.   Signed, Phill Myron. West Pugh, ANP, AACC   04/16/2017,  7:42 AM    Patient examined chart reviewed. Not taking much PO due to nausea Exam with lethargy agree with above No further cardiac recommendations will sign off   Jenkins Rouge

## 2017-04-16 NOTE — Progress Notes (Signed)
Pt woke up in a panic and pulled CPAP off and didn't want to put it back on.

## 2017-04-16 NOTE — Care Management Note (Addendum)
Case Management Note  Patient Details  Name: SHENIA ALAN MRN: [id number] Date of Birth: 05/03/1981  Subjective/Objective:  Adm with pancreatitis. From home, ind PTA. Recommended for [facility] and RW.  Patient agreeable. Offered choice of [facility] agencies. D/C date unknown.         Action/Plan: [facility] notified of need for HHPT. CM will follow for ongoing needs.    Expected Discharge Date:       04/18/2017           Expected Discharge Plan:  [place]  In-House Referral:     Discharge planning Services  CM Consult  Post Acute Care Choice:    Choice offered to:     DME Arranged:    DME Agency:     HH Arranged:    HH Agency:     Status of Service:  In process, will continue to follow  If discussed at Long Length of Stay Meetings, dates discussed:    Additional Comments:  Vale Mousseau, Chauncey Reading, RN 04/16/2017, 3:18 PM

## 2017-04-16 NOTE — Care Management (Signed)
    Durable Medical Equipment        Start     Ordered   04/16/17 1525  For home use only DME Walker rolling  Once    Question:  Patient needs a walker to treat with the following condition  Answer:  Weakness   04/16/17 1524

## 2017-04-17 ENCOUNTER — Inpatient Hospital Stay (HOSPITAL_COMMUNITY): Payer: 59

## 2017-04-17 ENCOUNTER — Encounter (HOSPITAL_COMMUNITY): Payer: Self-pay

## 2017-04-17 DIAGNOSIS — R0600 Dyspnea, unspecified: Secondary | ICD-10-CM

## 2017-04-17 DIAGNOSIS — J80 Acute respiratory distress syndrome: Secondary | ICD-10-CM

## 2017-04-17 DIAGNOSIS — R109 Unspecified abdominal pain: Secondary | ICD-10-CM

## 2017-04-17 LAB — BLOOD GAS, ARTERIAL
Acid-Base Excess: 6.8 mmol/L — ABNORMAL HIGH (ref 0.0–2.0)
Bicarbonate: 30.4 mmol/L — ABNORMAL HIGH (ref 20.0–28.0)
Drawn by: 270161
O2 Content: 3 L/min
O2 Saturation: 94.7 %
Patient temperature: 38
pCO2 arterial: 44.3 mmHg (ref 32.0–48.0)
pH, Arterial: 7.457 — ABNORMAL HIGH (ref 7.350–7.450)
pO2, Arterial: 67.6 mmHg — ABNORMAL LOW (ref 83.0–108.0)

## 2017-04-17 LAB — ECHOCARDIOGRAM COMPLETE
E decel time: 123 msec
FS: 44 % (ref 28–44)
Height: 63 in
IVS/LV PW RATIO, ED: 1.16
LA ID, A-P, ES: 25 mm
LA diam end sys: 25 mm
LA diam index: 1.11 cm/m2
LA vol A4C: 35.6 ml
LA vol index: 18.8 mL/m2
LA vol: 42.4 mL
LV PW d: 12.2 mm — AB (ref 0.6–1.1)
LV dias vol index: 30 mL/m2
LV dias vol: 68 mL (ref 46–106)
LV sys vol index: 10 mL/m2
LV sys vol: 22 mL
LVOT SV: 71 mL
LVOT VTI: 22.6 cm
LVOT area: 3.14 cm2
LVOT diameter: 20 mm
LVOT peak grad rest: 9 mmHg
LVOT peak vel: 147 cm/s
MV Dec: 123
MV Peak grad: 8 mmHg
MV pk E vel: 138 m/s
Simpson's disk: 68
Stroke v: 46 ml
TAPSE: 18.2 mm
Weight: 3834.24 oz

## 2017-04-17 LAB — CBC
HCT: 29.5 % — ABNORMAL LOW (ref 36.0–46.0)
Hemoglobin: 9.4 g/dL — ABNORMAL LOW (ref 12.0–15.0)
MCH: 25.9 pg — ABNORMAL LOW (ref 26.0–34.0)
MCHC: 31.9 g/dL (ref 30.0–36.0)
MCV: 81.3 fL (ref 78.0–100.0)
Platelets: 186 10*3/uL (ref 150–400)
RBC: 3.63 MIL/uL — ABNORMAL LOW (ref 3.87–5.11)
RDW: 14.4 % (ref 11.5–15.5)
WBC: 16.8 10*3/uL — ABNORMAL HIGH (ref 4.0–10.5)

## 2017-04-17 LAB — MAGNESIUM
Magnesium: 1.6 mg/dL — ABNORMAL LOW (ref 1.7–2.4)
Magnesium: 2.6 mg/dL — ABNORMAL HIGH (ref 1.7–2.4)

## 2017-04-17 LAB — GLUCOSE, CAPILLARY
Glucose-Capillary: 166 mg/dL — ABNORMAL HIGH (ref 65–99)
Glucose-Capillary: 131 mg/dL — ABNORMAL HIGH (ref 65–99)
Glucose-Capillary: 117 mg/dL — ABNORMAL HIGH (ref 65–99)
Glucose-Capillary: 132 mg/dL — ABNORMAL HIGH (ref 65–99)
Glucose-Capillary: 108 mg/dL — ABNORMAL HIGH (ref 65–99)

## 2017-04-17 LAB — COMPREHENSIVE METABOLIC PANEL
ALT: 69 U/L — ABNORMAL HIGH (ref 14–54)
AST: 25 U/L (ref 15–41)
Albumin: 2.5 g/dL — ABNORMAL LOW (ref 3.5–5.0)
Alkaline Phosphatase: 51 U/L (ref 38–126)
Anion gap: 6 (ref 5–15)
BUN: 7 mg/dL (ref 6–20)
CO2: 32 mmol/L (ref 22–32)
Calcium: 7.9 mg/dL — ABNORMAL LOW (ref 8.9–10.3)
Chloride: 103 mmol/L (ref 101–111)
Creatinine, Ser: 0.62 mg/dL (ref 0.44–1.00)
GFR calc Af Amer: >60 mL/min (ref >60)
GFR calc non Af Amer: >60 mL/min (ref >60)
Glucose, Bld: 135 mg/dL — ABNORMAL HIGH (ref 65–99)
Potassium: 3.2 mmol/L — ABNORMAL LOW (ref 3.5–5.1)
Sodium: 141 mmol/L (ref 135–145)
Total Bilirubin: 1.6 mg/dL — ABNORMAL HIGH (ref 0.3–1.2)
Total Protein: 5.6 g/dL — ABNORMAL LOW (ref 6.5–8.1)

## 2017-04-17 LAB — VITAMIN B12: Vitamin B-12: 670 pg/mL (ref 180–914)

## 2017-04-17 LAB — HEPATITIS B SURFACE ANTIGEN: Hepatitis B Surface Ag: NEGATIVE

## 2017-04-17 LAB — HEMOGLOBIN A1C
Hgb A1c MFr Bld: 5.7 % — ABNORMAL HIGH (ref 4.8–5.6)
Mean Plasma Glucose: 117 mg/dL

## 2017-04-17 LAB — HEPATITIS C ANTIBODY: HCV Ab: <0.1 s/co ratio (ref 0.0–0.9)

## 2017-04-17 LAB — FOLATE: Folate: 12.4 ng/mL (ref >5.9)

## 2017-04-17 LAB — HEPATITIS B CORE ANTIBODY, TOTAL: Hep B Core Total Ab: NEGATIVE

## 2017-04-17 LAB — PHOSPHORUS: Phosphorus: 1.8 mg/dL — ABNORMAL LOW (ref 2.5–4.6)

## 2017-04-17 MED ORDER — PANTOPRAZOLE SODIUM 40 MG IV SOLR
40.0000 mg | Freq: Two times a day (BID) | INTRAVENOUS | Status: DC
  Administered 2017-04-18 – 2017-04-25 (×15): 40 mg via INTRAVENOUS
  Filled 2017-04-17 (×15): qty 40

## 2017-04-17 MED ORDER — SENNOSIDES-DOCUSATE SODIUM 8.6-50 MG PO TABS
1.0000 | ORAL_TABLET | Freq: Two times a day (BID) | ORAL | Status: DC | PRN
  Filled 2017-04-17: qty 1

## 2017-04-17 MED ORDER — VITAL AF 1.2 CAL PO LIQD
1000.0000 mL | ORAL | Status: DC
  Administered 2017-04-17: 1000 mL
  Filled 2017-04-17 (×5): qty 1000

## 2017-04-17 MED ORDER — BOOST / RESOURCE BREEZE PO LIQD: 1.0000 | Freq: Two times a day (BID) | ORAL | Status: DC

## 2017-04-17 MED ORDER — PRO-STAT SUGAR FREE PO LIQD: 30.0000 mL | Freq: Two times a day (BID) | ORAL | Status: DC

## 2017-04-17 MED ORDER — DIPHENHYDRAMINE HCL 50 MG/ML IJ SOLN: 12.5000 mg | Freq: Four times a day (QID) | INTRAMUSCULAR | Status: DC | PRN

## 2017-04-17 MED ORDER — NALOXONE HCL 0.4 MG/ML IJ SOLN: 0.4000 mg | INTRAMUSCULAR | Status: DC | PRN

## 2017-04-17 MED ORDER — IOPAMIDOL (ISOVUE-300) INJECTION 61%
INTRAVENOUS | Status: AC
  Filled 2017-04-17: qty 60

## 2017-04-17 MED ORDER — HYDROMORPHONE 1 MG/ML IV SOLN
INTRAVENOUS | Status: DC
  Administered 2017-04-18: 3 mg via INTRAVENOUS
  Administered 2017-04-18: 3.6 mg via INTRAVENOUS
  Administered 2017-04-18: 2.7 mg via INTRAVENOUS
  Administered 2017-04-18: 1.8 mg via INTRAVENOUS
  Administered 2017-04-18: 2.1 mg via INTRAVENOUS
  Administered 2017-04-18: 4.8 mg via INTRAVENOUS
  Administered 2017-04-18: 7 mg via INTRAVENOUS
  Administered 2017-04-19: 3.9 mg via INTRAVENOUS
  Administered 2017-04-19: 1.8 mg via INTRAVENOUS
  Administered 2017-04-19: 3.6 mg via INTRAVENOUS
  Administered 2017-04-19: 3 mg via INTRAVENOUS
  Administered 2017-04-19: 2.1 mg via INTRAVENOUS
  Administered 2017-04-20: 3.6 mg via INTRAVENOUS
  Administered 2017-04-20: 25 mg via INTRAVENOUS
  Administered 2017-04-20: 2.1 mg via INTRAVENOUS
  Administered 2017-04-20: 4.2 mg via INTRAVENOUS
  Administered 2017-04-20: 2.4 mg via INTRAVENOUS
  Administered 2017-04-20: 4.5 mg via INTRAVENOUS
  Administered 2017-04-20: 1.8 mg via INTRAVENOUS
  Administered 2017-04-21: 4 mg via INTRAVENOUS
  Administered 2017-04-21: 17:00:00 via INTRAVENOUS
  Administered 2017-04-21 (×2): 2.7 mg via INTRAVENOUS
  Administered 2017-04-21: 3 mg via INTRAVENOUS
  Administered 2017-04-21: 2.7 mg via INTRAVENOUS
  Administered 2017-04-21: 3 mg via INTRAVENOUS
  Administered 2017-04-22: 4.5 mg via INTRAVENOUS
  Administered 2017-04-22: 2.7 mg via INTRAVENOUS
  Administered 2017-04-22: 3 mg via INTRAVENOUS
  Administered 2017-04-22: 1.8 mg via INTRAVENOUS
  Administered 2017-04-22: 2.1 mg via INTRAVENOUS
  Administered 2017-04-22: 1.8 mg via INTRAVENOUS
  Administered 2017-04-23: 1.2 mg via INTRAVENOUS
  Administered 2017-04-23: 15:00:00 via INTRAVENOUS
  Administered 2017-04-23 (×2): 1.2 mg via INTRAVENOUS
  Administered 2017-04-23: 1.5 mg via INTRAVENOUS
  Administered 2017-04-23: 2.7 mg via INTRAVENOUS
  Administered 2017-04-24: 0.9 mg via INTRAVENOUS
  Administered 2017-04-24: 1.5 mg via INTRAVENOUS
  Filled 2017-04-17 (×5): qty 25

## 2017-04-17 MED ORDER — DIPHENHYDRAMINE HCL 12.5 MG/5ML PO ELIX
12.5000 mg | ORAL_SOLUTION | Freq: Four times a day (QID) | ORAL | Status: DC | PRN
  Administered 2017-04-20 – 2017-04-22 (×3): 12.5 mg via ORAL
  Filled 2017-04-17 (×3): qty 5

## 2017-04-17 MED ORDER — ONDANSETRON HCL 4 MG/2ML IJ SOLN
4.0000 mg | Freq: Four times a day (QID) | INTRAMUSCULAR | Status: DC | PRN
  Administered 2017-04-22: 4 mg via INTRAVENOUS
  Filled 2017-04-17: qty 2

## 2017-04-17 MED ORDER — MAGNESIUM SULFATE 4 GM/100ML IV SOLN
4.0000 g | Freq: Once | INTRAVENOUS | Status: AC
  Administered 2017-04-17: 4 g via INTRAVENOUS
  Filled 2017-04-17: qty 100

## 2017-04-17 MED ORDER — ALPRAZOLAM 0.25 MG PO TABS
0.2500 mg | ORAL_TABLET | Freq: Every day | ORAL | Status: DC
  Administered 2017-04-17 – 2017-04-29 (×13): 0.25 mg via ORAL
  Filled 2017-04-17 (×13): qty 1

## 2017-04-17 MED ORDER — PIPERACILLIN-TAZOBACTAM 3.375 G IVPB
3.3750 g | Freq: Three times a day (TID) | INTRAVENOUS | Status: DC
Start: 2017-04-17 — End: 2017-04-18
  Administered 2017-04-18 (×2): 3.375 g via INTRAVENOUS
  Filled 2017-04-17 (×2): qty 50

## 2017-04-17 MED ORDER — SODIUM CHLORIDE 0.9% FLUSH: 9.0000 mL | INTRAVENOUS | Status: DC | PRN

## 2017-04-17 MED ORDER — VITAL HIGH PROTEIN PO LIQD
1000.0000 mL | ORAL | Status: DC
  Administered 2017-04-17: 1000 mL
  Administered 2017-04-18 (×5)
  Administered 2017-04-18: 1000 mL
  Administered 2017-04-18 (×2)
  Filled 2017-04-17 (×2): qty 1000

## 2017-04-17 MED ORDER — PANCRELIPASE (LIP-PROT-AMYL) 12000-38000 UNITS PO CPEP
72000.0000 [IU] | ORAL_CAPSULE | Freq: Three times a day (TID) | ORAL | Status: DC
  Administered 2017-04-17 – 2017-04-28 (×32): 72000 [IU] via ORAL
  Filled 2017-04-17 (×2): qty 2
  Filled 2017-04-17 (×2): qty 6
  Filled 2017-04-17: qty 2
  Filled 2017-04-17: qty 6
  Filled 2017-04-17 (×2): qty 2
  Filled 2017-04-17 (×2): qty 6
  Filled 2017-04-17 (×6): qty 2
  Filled 2017-04-17 (×2): qty 6
  Filled 2017-04-17 (×5): qty 2
  Filled 2017-04-17: qty 6
  Filled 2017-04-17 (×5): qty 2
  Filled 2017-04-17: qty 6
  Filled 2017-04-17 (×2): qty 2
  Filled 2017-04-17: qty 6

## 2017-04-17 MED ORDER — POTASSIUM CHLORIDE 10 MEQ/100ML IV SOLN
10.0000 meq | INTRAVENOUS | Status: AC
  Administered 2017-04-17 (×5): 10 meq via INTRAVENOUS
  Filled 2017-04-17 (×6): qty 100

## 2017-04-17 MED ORDER — PIPERACILLIN-TAZOBACTAM 3.375 G IVPB
3.3750 g | Freq: Three times a day (TID) | INTRAVENOUS | Status: DC
  Administered 2017-04-17: 3.375 g via INTRAVENOUS
  Filled 2017-04-17: qty 50

## 2017-04-17 MED ORDER — HYDROMORPHONE 1 MG/ML IV SOLN
INTRAVENOUS | Status: DC
  Administered 2017-04-17: 3 mg via INTRAVENOUS

## 2017-04-17 MED ORDER — INSULIN ASPART 100 UNIT/ML ~~LOC~~ SOLN
2.0000 [IU] | SUBCUTANEOUS | Status: DC
  Administered 2017-04-18 (×2): 2 [IU] via SUBCUTANEOUS
  Administered 2017-04-18: 4 [IU] via SUBCUTANEOUS
  Administered 2017-04-19 – 2017-04-20 (×6): 2 [IU] via SUBCUTANEOUS
  Administered 2017-04-20: 4 [IU] via SUBCUTANEOUS
  Administered 2017-04-20 (×2): 2 [IU] via SUBCUTANEOUS
  Administered 2017-04-20: 4 [IU] via SUBCUTANEOUS
  Administered 2017-04-20 – 2017-04-21 (×2): 2 [IU] via SUBCUTANEOUS
  Administered 2017-04-21: 4 [IU] via SUBCUTANEOUS
  Administered 2017-04-21: 2 [IU] via SUBCUTANEOUS
  Administered 2017-04-21 – 2017-04-22 (×5): 4 [IU] via SUBCUTANEOUS
  Administered 2017-04-22: 2 [IU] via SUBCUTANEOUS
  Administered 2017-04-22 (×2): 4 [IU] via SUBCUTANEOUS
  Administered 2017-04-22 – 2017-04-23 (×2): 6 [IU] via SUBCUTANEOUS
  Administered 2017-04-23: 2 [IU] via SUBCUTANEOUS
  Administered 2017-04-23 (×2): 4 [IU] via SUBCUTANEOUS
  Administered 2017-04-23 – 2017-04-24 (×2): 2 [IU] via SUBCUTANEOUS

## 2017-04-17 MED ORDER — PIPERACILLIN-TAZOBACTAM 3.375 G IVPB 30 MIN: 3.3750 g | Freq: Four times a day (QID) | INTRAVENOUS | Status: DC

## 2017-04-17 MED ORDER — VITAL HIGH PROTEIN PO LIQD
1000.0000 mL | ORAL | Status: DC
  Filled 2017-04-17 (×3): qty 1000

## 2017-04-17 MED ORDER — IOPAMIDOL (ISOVUE-370) INJECTION 76%
100.0000 mL | Freq: Once | INTRAVENOUS | Status: AC | PRN
  Administered 2017-04-17: 100 mL via INTRAVENOUS

## 2017-04-17 MED ORDER — PANCRELIPASE (LIP-PROT-AMYL) 12000-38000 UNITS PO CPEP
36000.0000 [IU] | ORAL_CAPSULE | Freq: Three times a day (TID) | ORAL | Status: DC
  Administered 2017-04-17 – 2017-04-29 (×24): 36000 [IU] via ORAL
  Filled 2017-04-17: qty 1
  Filled 2017-04-17: qty 3
  Filled 2017-04-17: qty 1
  Filled 2017-04-17: qty 3
  Filled 2017-04-17 (×5): qty 1
  Filled 2017-04-17 (×2): qty 3
  Filled 2017-04-17 (×8): qty 1
  Filled 2017-04-17 (×2): qty 3
  Filled 2017-04-17 (×5): qty 1
  Filled 2017-04-17 (×2): qty 3
  Filled 2017-04-17 (×2): qty 1

## 2017-04-17 MED ORDER — POTASSIUM CHLORIDE CRYS ER 20 MEQ PO TBCR
40.0000 meq | EXTENDED_RELEASE_TABLET | Freq: Two times a day (BID) | ORAL | Status: DC
  Filled 2017-04-17: qty 2

## 2017-04-17 NOTE — Progress Notes (Signed)
Initial Nutrition Assessment  DOCUMENTATION CODES:  Obesity unspecified  INTERVENTION:  Initiate TF via NJT with Vital af 1.2 at rate of 30 ml/h and advance by 10 ml every 6 hrs to goal of 60 cc/hr (1440 ml per day)  to provide 1728 kcals, 108 gm protein, 1168 ml free water daily.  RD will follow up Monday to assess TF tolerance.   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 Presented to ED with abdominal pain, nausea, vomiting. Worked up for acute pancreatitis w/o pseudocyst. Admitted for management.   Despite being able to be advanced to a D3 diet, Pt had small bore feeding tube placed today due to worsening pancreatitis on CT scan-> Majority of pancreas is hypoperfused, consistent with necrosis. Also note that pt has likely ARDS w/ acute resp failure.   Pt still in procedure on RD arrival. On information gathered from chart.   As stated, she has had some nutrition since admit, has mostly consumed 10-15% of meals. Had actually consumed 50% of a 1 full liquid meal. Had progressed from CL to D3, but now back to CL.   Pt had Gastric Sleeve Performed in May 2017. Was ~ 242 lbs at that time. Lost down to 211 lbs last November, but then plataeued at 220. Wt very stable x 6 months.   Physical Exam : N/A  Labs reviewed: Phos: 1.8, Mag 2.6 Bgs 130-160, K trending down, has had  Loose stools.  Albumin: 2.5, WBC:16.8 Meds: MAG, IVf, vital high pro, prostat, xanax, dilaudid, insulin, creon with meals, Zofran, ppi,   Recent Labs Lab 04/15/17 1035 04/16/17 0623 04/17/17 0433 04/17/17 0450 04/17/17 1438  NA 140 140 141  --   --   K 4.0 3.4* 3.2*  --   --   CL 109 107 103  --   --   CO2 26 25 32  --   --   BUN 13 8 7   --   --   CREATININE 0.83 0.56 0.62  --   --   CALCIUM 8.5* 7.7* 7.9*  --   --   MG  --   --   --  1.6* 2.6*  PHOS  --   --   --   --  1.8*  GLUCOSE 169* 116* 135*  --   --    Diet Order:  Diet clear liquid Room service appropriate? Yes; Fluid consistency: Thin  Skin:  Reviewed, no issues  Last BM:  6/29-diarrhea  Height:  Ht Readings from Last 1 Encounters:  04/13/17 5\' 3"  (1.6 m)   Weight:  Wt Readings from Last 1 Encounters:  04/17/17 239 lb 10.2 oz (108.7 kg)   Wt Readings from Last 10 Encounters:  04/17/17 239 lb 10.2 oz (108.7 kg)  03/05/17 219 lb (99.3 kg)  01/13/17 220 lb (99.8 kg)  11/18/16 217 lb 8 oz (98.7 kg)  09/15/16 211 lb (  95.7 kg)  08/28/16 217 lb (98.4 kg)  08/13/16 217 lb (98.4 kg)  07/09/16 216 lb (98 kg)  05/06/16 219 lb (99.3 kg)  04/17/16 220 lb (99.8 kg)  Admit weight 219 lbs   Ideal Body Weight:  52.27 kg  BMI:  Body mass index using admit weight is 38.8 kg/m.  Estimated Nutritional Needs:  Kcal:  1650-1900 (32-36 kcal/kg ibw) Protein:  90-105g (1.7-2 g/kg IBW) Fluid:  Per MD  EDUCATION NEEDS:  No education needs identified at this time  Burtis Junes RD, LDN, [place] Clinical Nutrition Pager: [phone] 04/17/2017 4:31 PM

## 2017-04-17 NOTE — Progress Notes (Signed)
Pt will be transferring to [place] icu/sd.report called to charge nurse.

## 2017-04-17 NOTE — Progress Notes (Signed)
[facility] for PCA Indication: pain control, pancreatitis  Allergies  Allergen Reactions  . Bromocriptine Mesylate Nausea Only  . Depo-Medrol [Methylprednisolone Acetate] Itching and Rash  . Imitrex [Sumatriptan] Rash   Patient Measurements: Height: 5\' 3"  (160 cm) Weight: 239 lb 10.2 oz (108.7 kg) IBW/kg (Calculated) : 52.4  Vital Signs: Temp: 99.8 F (37.7 C) (06/29 0545) Temp Source: Oral (06/29 0545) BP: 146/87 (06/29 1000) Intake/Output from previous day: 06/28 0701 - 06/29 0700 In: 1897 [P.O.:650; I.V.:1247] Out: 2250 [Urine:2250] Intake/Output from this shift: Total I/O In: 240 [P.O.:240] Out: -   Labs:  Recent Labs  04/15/17 0507 04/15/17 1035 04/16/17 0623 04/16/17 1052 04/17/17 0433 04/17/17 0450  WBC 16.2*  --  13.8*  --  16.8*  --   HGB 11.8*  --  8.7* 9.9* 9.4*  --   HCT 37.3  --  27.1* 31.0* 29.5*  --   PLT 190  --  161  --  186  --   CREATININE 0.82 0.83 0.56  --  0.62  --   MG  --   --   --   --   --  1.6*  ALBUMIN 2.6* 2.7* 2.2*  --  2.5*  --   PROT 5.5* 5.8* 4.8*  --  5.6*  --   AST 50* 46* 28  --  25  --   ALT 145* 139* 78*  --  69*  --   ALKPHOS 55 56 42  --  51  --   BILITOT 2.2* 2.4* 1.5*  --  1.6*  --    Estimated Creatinine Clearance: 113.8 mL/min (by C-G formula based on SCr of 0.62 mg/dL).  Medical History: Past Medical History:  Diagnosis Date  . Breast mass 02/2017  . Dental crown present   . Hypertension    states BP fluctuates; has been on med. x 1 yr.  . Migraines   . Pituitary adenoma ([place])   . Seasonal allergies   . Sleep apnea    no CPAP use in > 6 mos.   Medications:  Scheduled:  . ALPRAZolam  0.25 mg Oral Daily  . chlorhexidine  15 mL Mouth Rinse BID  . Chlorhexidine Gluconate Cloth  6 each Topical Daily  . heparin  7,500 Units Subcutaneous Q8H  . HYDROmorphone   Intravenous Q4H  . insulin aspart  0-5 Units Subcutaneous QHS  . insulin aspart  0-9 Units Subcutaneous  TID WC  . labetalol  20 mg Intravenous Q8H  . lipase/protease/amylase  36,000 Units Oral TID BM  . lipase/protease/amylase  72,000 Units Oral TID WC  . mouth rinse  15 mL Mouth Rinse q12n4p  . ondansetron (ZOFRAN) IV  4 mg Intravenous TID WC & HS  . pantoprazole  40 mg Oral BID AC  . sodium chloride flush  10-40 mL Intracatheter Q12H   Assessment: 36yo female with pancreatitis and pain difficult to control.  Full dose dilaudid PCA started 6/27. She did not rest well. Her abdominal pain is somewhat improved.  Her mother stated she was unable to go back to sleep. Pain is now a 6 per patient.  She had some drowsiness, which the xanax maybe contributing. The xanax dose was changed to daily today. The PCA dose remains the same, no basal dose. Will adjust lockout time. Patient had a BM.  Goal of Therapy:  Improved pain control  Plan:  PCA dosing change of lockout interval from 8-->12 minutes. Max dose per  hour set at 1.25mg . Consider decreasing PCA to low dose if drowsiness persists Monitor labs, pain control, vitals and respirations  Isac Sarna, BS [place], BCPS Clinical Pharmacist Pager [phone]  04/17/2017,12:52 PM

## 2017-04-17 NOTE — Progress Notes (Signed)
Subjective:  Patient had brown BM today, loose. She did not rest well. Her abdominal pain is somewhat improved. Did not feel like she could eat breakfast, pancakes were provided.   Objective: Vital signs in last 24 hours: Temp:  [99.1 F (37.3 C)-101.9 F (38.8 C)] 99.8 F (37.7 C) (06/29 0545) Resp:  [16-35] 31 (06/29 0643) BP: (130-158)/(80-109) 157/81 (06/29 0500) SpO2:  [94 %-100 %] 96 % (06/29 0643) FiO2 (%):  [100 %] 100 % (06/29 0136) Weight:  [239 lb 10.2 oz (108.7 kg)] 239 lb 10.2 oz (108.7 kg) (06/29 0500) Last BM Date: 04/13/17 General:   Keeps her eyes closed but responds appropriately.  NAD. Mom at bedside.  Head:  Normocephalic and atraumatic. Eyes:  Sclera clear, no icterus.  Chest: diminished breath sounds in bases.     Heart:  Regular rate, tachycardia; no murmurs, clicks, rubs,  or gallops. Abdomen:  Soft, moderate epigastric tenderness. Obese. Hypoactive bowel sounds. Without guarding, and without rebound.   Extremities:  Without clubbing, deformity or edema. Neurologic:  Alert and  oriented x4;  grossly normal neurologically. Skin:  Intact without significant lesions or rashes. Psych:  Alert and cooperative. Normal mood and affect.  Intake/Output from previous day: 06/28 0701 - 06/29 0700 In: 1897 [P.O.:650; I.V.:1247] Out: 2250 [Urine:2250] Intake/Output this shift: No intake/output data recorded.  Lab Results: CBC  Recent Labs  04/15/17 0507 04/16/17 0623 04/16/17 1052 04/17/17 0433  WBC 16.2* 13.8*  --  16.8*  HGB 11.8* 8.7* 9.9* 9.4*  HCT 37.3 27.1* 31.0* 29.5*  MCV 81.8 81.4  --  81.3  PLT 190 161  --  186   BMET  Recent Labs  04/15/17 1035 04/16/17 0623 04/17/17 0433  NA 140 140 141  K 4.0 3.4* 3.2*  CL 109 107 103  CO2 26 25 32  GLUCOSE 169* 116* 135*  BUN 13 8 7   CREATININE 0.83 0.56 0.62  CALCIUM 8.5* 7.7* 7.9*   LFTs  Recent Labs  04/15/17 1035 04/16/17 0623 04/17/17 0433  BILITOT 2.4* 1.5* 1.6*  ALKPHOS 56 42 51   AST 46* 28 25  ALT 139* 78* 69*  PROT 5.8* 4.8* 5.6*  ALBUMIN 2.7* 2.2* 2.5*    Recent Labs  04/15/17 0507 04/16/17 0623  LIPASE 354* 76*   PT/INR No results for input(s): LABPROT, INR in the last 72 hours.    Imaging Studies: Dg Chest 2 View  Result Date: 04/12/2017 CLINICAL DATA:  Patient c/o upper abd pain. Per patient nausea, vomiting w/ bright red blood, and diarrhea. Patient has generalized weakness. History of HTN, Breast mass. EXAM: CHEST  2 VIEW COMPARISON:  01/03/2016 FINDINGS: Cardiac silhouette is top-normal in size. No mediastinal or hilar masses. No evidence of adenopathy. Clear lungs.  No pleural effusion or pneumothorax. Skeletal structures are intact. IMPRESSION: No active cardiopulmonary disease. Electronically Signed   By: Lajean Manes M.D.   On: 04/12/2017 23:52   US Abdomen Complete  Result Date: 04/13/2017 CLINICAL DATA:  Pancreatitis, nausea, vomiting, diarrhea, breast mass EXAM: ABDOMEN ULTRASOUND COMPLETE COMPARISON:  CT abdomen pelvis 04/12/2017 FINDINGS: Gallbladder: Dependent nonshadowing echogenic material within gallbladder lumen question sludge. Gallbladder wall upper normal thickness 3 mm diameter. No sonographic Murphy sign or definite shadowing calculi. Common bile duct: Diameter: 3 mm diameter , normal Liver: Echogenic, likely fatty infiltration, though this can be seen with cirrhosis and certain infiltrative disorders. Area of focally decreased attenuation within liver adjacent to gallbladder fossa 3.4 x 2.9 x 3.5 cm question fatty  sparing versus hypoechoic mass; no definite mass lesion seen at this site on prior CT, favor sparing. IVC: Short segment of intrahepatic IVC normal appearance, intrahepatic portion obscured by bowel gas Pancreas: Thickened and hypoechoic consistent with pancreatitis features seen on CT. No discrete mass, pseudocyst, or ductal dilatation. No shadowing calcification. Portions of the tail and uncinate inner obscured by bowel gas.  Spleen: Normal appearance, 5.7 cm length Right Kidney: Length: 11.1 cm. Normal morphology without mass or hydronephrosis. Left Kidney: Length: 11.8 cm. Normal morphology without mass or hydronephrosis. Abdominal aorta: Normal caliber Other findings: No free fluid IMPRESSION: Enlarged pancreas consistent with pancreatitis features seen on CT. Incomplete visualization of distal pancreatic tail and portions of uncinate. Probable sludge within gallbladder with upper normal gallbladder wall thickness. No definite gallstones or sonographic Murphy sign. Hypoechoic region within liver 3.5 cm greatest size, question focal sparing within a background of fatty infiltration ; no definite hepatic mass was seen at this site on the preceding CT. Electronically Signed   By: Lavonia Dana M.D.   On: 04/13/2017 10:14   Ct Abdomen Pelvis W Contrast  Result Date: 04/13/2017 CLINICAL DATA:  Upper abdominal pain, nausea vomiting and diarrhea EXAM: CT ABDOMEN AND PELVIS WITH CONTRAST TECHNIQUE: Multidetector CT imaging of the abdomen and pelvis was performed using the standard protocol following bolus administration of intravenous contrast. CONTRAST:  117mL ISOVUE-300 IOPAMIDOL (ISOVUE-300) INJECTION 61% COMPARISON:  10/24/2013, mammogram 03/04/2017 FINDINGS: Lower chest: Lung bases demonstrate no acute consolidation or pleural effusion. Mild cardiomegaly. Partially visualized low-density lesion in the lateral aspect of left breast, likely corresponding to history of recent excision. Hepatobiliary: Mild steatosis. No biliary dilatation. Small amount of pericholecystic fluid. No calcified gallstones. Pancreas: Enlarged and edematous. Moderate-to-marked peripancreatic fluid and inflammatory change consistent with acute pancreatitis. No definite focal fluid collections at the pancreas bed. Spleen: Normal in size without focal abnormality. Adrenals/Urinary Tract: Adrenal glands are unremarkable. Kidneys are normal, without renal calculi,  focal lesion, or hydronephrosis. Bladder is unremarkable. Stomach/Bowel: Postsurgical changes of the stomach. Appendix appears normal. No evidence of bowel wall thickening, distention, or inflammatory changes. Vascular/Lymphatic: Normal portal vein and splenic vein enhancement. Non aneurysmal aorta. No significantly enlarged lymph nodes. Reproductive: Uterus and bilateral adnexa are unremarkable. Other: Small free fluid in the pelvis. Small to moderate fluid in the upper abdomen. No free air. Musculoskeletal: No acute or significant osseous findings. IMPRESSION: 1. Enlarged edematous pancreas with moderate-to-marked fluid and edema surrounding the pancreas consistent with acute pancreatitis. No focal or organized fluid collections are seen at this time. 2. Small amount of pericholecystic fluid, uncertain if this is related to generalized fluid in the upper abdomen from pancreatitis or if this is due to inflammation of the gallbladder. Suggest correlation with gallbladder ultrasound. 3. Small free fluid in the abdomen and pelvis. 4. Fatty liver 5. Focal hypodensity in the lateral aspect of the left breast, felt related to history of recent breast excision. Electronically Signed   By: Donavan Foil M.D.   On: 04/13/2017 00:20   Dg Chest Port 1 View  Result Date: 04/14/2017 CLINICAL DATA:  Sepsis. EXAM: PORTABLE CHEST 1 VIEW COMPARISON:  04/12/2017 FINDINGS: 1217 hours. Markedly low lung volumes. The cardio pericardial silhouette is enlarged. Vascular congestion with interstitial opacity the basilar airspace disease suggests pulmonary edema. A component of the basilar opacity may be atelectatic. Question small bilateral pleural effusions. The visualized bony structures of the thorax are intact. Telemetry leads overlie the chest. IMPRESSION: Markedly low volume film with cardiomegaly, vascular  congestion and probable edema. Bibasilar atelectasis with probable small bilateral effusions. Electronically Signed   By:  Misty Stanley M.D.   On: 04/14/2017 12:36   Dg Chest Port 1v Same Day  Result Date: 04/14/2017 CLINICAL DATA:  36 y/o  F; PICC line placement. EXAM: PORTABLE CHEST 1 VIEW COMPARISON:  04/14/2017 chest radiograph FINDINGS: Stable cardiac silhouette given projection and technique. Low lung volumes accentuate pulmonary markings. Bibasilar opacities probably represent atelectasis and possible small effusions. Right PICC line tip projects over the cavoatrial junction. No acute osseous abnormality is evident. IMPRESSION: Right PICC line tip projects over cavoatrial junction. Stable low lung volumes and bibasilar opacities probably representing atelectasis and effusions. Electronically Signed   By: Kristine Garbe M.D.   On: 04/14/2017 22:01  [2 weeks]   Assessment: 36 year old female admitted with severe pancreatitis (idiopathic vs microlithiasis), with continued improvement in LFTs, lipase. Remains with leukocytosis. Tmax 101.9. Zosyn was discontinued 04/15/17 (early afternoon).  Overall, pain is slowly improving. Tachycardia persists and is multifactorial in this setting. Cardiology following patient. Has been unable to advance diet, tolerated clears only. Continues with acute hypoxic respiratory failure concerning for ARDS and third spacing. Discussed with Dr. Sheran Fava. Volume status to be determined and she requests no adjustment in IVF or administering diuretics.   Hgb stable. At least in part dilutional. No evidence of overt GI bleeding with unremarkable BM today.   Plan: 1. Discussed with Dr. Sheran Fava. Plans for CT pancreas today to evaluate for pancreatic infected necrosis, given persistent SIRS with fever. Further management pending findings. She is also aware that Zosyn was d/c'd 04/15/17. Plans for antibiotic coverage to be determined following CT.   Laureen Ochs. Bernarda Caffey [facility] [phone] 6/29/20189:56 AM     LOS: 4 days

## 2017-04-17 NOTE — Consult Note (Signed)
PULMONARY / CRITICAL CARE MEDICINE   Name: Kaylee Montgomery MRN: [id number] DOB: 1981/07/10    ADMISSION DATE:  04/12/2017 CONSULTATION DATE:  04/17/17  REFERRING MD:  Dr. Sheran Fava / [facility]   CHIEF COMPLAINT:  Pancreatitis   HISTORY OF PRESENT ILLNESS:   36 y/o F who initially presented to [facility] on 04/12/17 with reports of upper abdominal pain, nausea, vomiting, diarrhea and weakness.    She has a medical hx of morbid obesity s/p sleeve gastric surgery, HTN, pituitary tumor, breast lump removal, HTN, OSA on CPAP (not compliant) and very rare alcohol use (last one week prior to admit).  She was found to have a lipase of 6,774, AST 332, ALT 263, WBC 14 and potassium of 2.8.  CT of the abdomen / pelvis was assessed which demonstrated an enlarged edematous pancreas with fluid / edema surrounding the pancreas concerning for acute pancreatitis.  Blood cultures were obtained and negative to date.  She was admitted per [facility] for further care and treated with IVF's and narcotics.  She was started on empiric zosyn (6/26).  She continued to have tachycardia (130's), fever, & leukocytosis.  CXR was concerning for low lung volumes & right pleural effusion.  The patient is requiring 3L/[place].  ABG assessed on 6/29 > 7.457 / 44 / 67 / 30.4.  BiPAP was attempted but the patient did not tolerate.  GI consulted at [facility] > CT showed concern for pancreatic necrosis and splenic vein thrombosis.  She was subsequently transferred to [facility] for further evaluation.     PAST MEDICAL HISTORY :  She  has a past medical history of Breast mass (02/2017); Dental crown present; Hypertension; Migraines; Pituitary adenoma ([place]); Seasonal allergies; and Sleep apnea.  PAST SURGICAL HISTORY: She  has a past surgical history that includes Tonsillectomy (age 69); Laparoscopic gastric sleeve resection (N/A, 02/18/2016); Microlaryngoscopy with laser (04/28/2000; 12/17/2000); Cesarean section; and Breast lumpectomy with radioactive seed  localization (Left, 03/05/2017).  Allergies  Allergen Reactions  . Bromocriptine Mesylate Nausea Only  . Depo-Medrol [Methylprednisolone Acetate] Itching and Rash  . Imitrex [Sumatriptan] Rash    No current facility-administered medications on file prior to encounter.    Current Outpatient Prescriptions on File Prior to Encounter  Medication Sig  . cyclobenzaprine (FLEXERIL) 5 MG tablet Take 1 tablet (5 mg total) by mouth at bedtime.  . gabapentin (NEURONTIN) 300 MG capsule Take 1 capsule (300 mg total) by mouth at bedtime.  . metoprolol succinate (TOPROL XL) 50 MG 24 hr tablet Take 1 tablet (50 mg total) by mouth daily. Take with or immediately following a meal.    FAMILY HISTORY:  Her indicated that her mother is alive. She indicated that her father is alive. She indicated that her brother is alive. She indicated that the status of her maternal grandfather is unknown. She indicated that her son is alive. She indicated that the status of her neg hx is unknown.    SOCIAL HISTORY: She  reports that she has never smoked. She has never used smokeless tobacco. She reports that she drinks alcohol. She reports that she does not use drugs.  REVIEW OF SYSTEMS:  POSITIVES IN BOLD Gen: Denies fever, chills, weight change, fatigue, night sweats HEENT: Denies blurred vision, double vision, hearing loss, tinnitus, sinus congestion, rhinorrhea, sore throat, neck stiffness, dysphagia PULM: Denies shortness of breath, cough, sputum production, hemoptysis, wheezing CV: Denies chest pain, edema, orthopnea, paroxysmal nocturnal dyspnea, palpitations GI: Denies abdominal pain, nausea, vomiting, diarrhea, hematochezia, melena, constipation,  change in bowel habits GU: Denies dysuria, hematuria, polyuria, oliguria, urethral discharge Endocrine: Denies hot or cold intolerance, polyuria, polyphagia or appetite change Derm: Denies rash, dry skin, scaling or peeling skin change Heme: Denies easy bruising,  bleeding, bleeding gums Neuro: Denies headache, numbness, weakness, slurred speech, loss of memory or consciousness   SUBJECTIVE:  Reports ABD tenderness, currently on PCA pump.   VITAL SIGNS: BP (!) 146/87   Pulse (!) 139   Temp 99.8 F (37.7 C) (Oral)   Resp (!) 38   Ht 5\' 3"  (1.6 m)   Wt 239 lb 10.2 oz (108.7 kg)   LMP 04/14/2004 Comment: neg preg test  SpO2 97%   BMI 42.45 kg/m   HEMODYNAMICS: CVP:  [5 mmHg] 5 mmHg  VENTILATOR SETTINGS: FiO2 (%):  [94 %-100 %] 94 %  INTAKE / OUTPUT: I/O last 3 completed shifts: In: 3465.8 [P.O.:650; I.V.:2815.8] Out: 2850 [Urine:2850]  PHYSICAL EXAMINATION: General: Adult female, no distress  HEENT: Dry MM Neuro: Alert, oriented, grossly intact  CV: Tachy, no m/r/g PULM: even/non-labored, lungs bilaterally clear, diminished to bases, no wheeze  WU:JWJX, non-tender, bsx4 active  Extremities: warm/dry, no edema  Skin: no rashes or lesions   LABS:  BMET  Recent Labs Lab 04/15/17 1035 04/16/17 0623 04/17/17 0433  NA 140 140 141  K 4.0 3.4* 3.2*  CL 109 107 103  CO2 26 25 32  BUN 13 8 7   CREATININE 0.83 0.56 0.62  GLUCOSE 169* 116* 135*    Electrolytes  Recent Labs Lab 04/15/17 1035 04/16/17 0623 04/17/17 0433 04/17/17 0450  CALCIUM 8.5* 7.7* 7.9*  --   MG  --   --   --  1.6*    CBC  Recent Labs Lab 04/15/17 0507 04/16/17 0623 04/16/17 1052 04/17/17 0433  WBC 16.2* 13.8*  --  16.8*  HGB 11.8* 8.7* 9.9* 9.4*  HCT 37.3 27.1* 31.0* 29.5*  PLT 190 161  --  186    Coag's No results for input(s): APTT, INR in the last 168 hours.  Sepsis Markers  Recent Labs Lab 04/14/17 0600 04/14/17 0959 04/15/17 0830  LATICACIDVEN  --  3.6* 1.5  PROCALCITON 2.40  --   --     ABG  Recent Labs Lab 04/17/17 1150  PHART 7.457*  PCO2ART 44.3  PO2ART 67.6*    Liver Enzymes  Recent Labs Lab 04/15/17 1035 04/16/17 0623 04/17/17 0433  AST 46* 28 25  ALT 139* 78* 69*  ALKPHOS 56 42 51  BILITOT  2.4* 1.5* 1.6*  ALBUMIN 2.7* 2.2* 2.5*    Cardiac Enzymes No results for input(s): TROPONINI, PROBNP in the last 168 hours.  Glucose  Recent Labs Lab 04/16/17 0941 04/16/17 1106 04/16/17 1606 04/16/17 2132 04/17/17 0742 04/17/17 1150  GLUCAP 154* 136* 135* 129* 166* 131*    Imaging Ct Pancreas Abd W/wo  Result Date: 04/17/2017 CLINICAL DATA:  Pancreatitis. EXAM: CT ABDOMEN WITHOUT AND WITH CONTRAST TECHNIQUE: Multidetector CT imaging of the abdomen was performed following the standard protocol before and following the bolus administration of intravenous contrast. CONTRAST:  100 mL Isovue COMPARISON:  None. FINDINGS: Lower chest: Bilateral pleural effusions and dense basilar atelectasis is new from comparison exam. Hepatobiliary: On arterial phase imaging there is a round well-circumscribed focus of hypoperfusion measuring 3.5 cm (image 48, series 4 )which is isodense to the liver on the venous phase imaging. There is additionally hypoperfusion to the more general posterior RIGHT hepatic lobe. These are felt benign vascular profusion anomalies. No  enhancing hepatic lesion. No biliary duct dilatation. Small amount sludge within the gallbladder. Gallbladder is nondistended. Pancreas: Pancreas is edematous measuring 4 cm in diameter compared to 3.4 cm on comparison CT. There is hypoperfusion of a large portion of the body the pancreas measuring 4.0 x 8.8 cm (image 56, series 4). Only the head and tail of the pancreas perfuse normally. The pancreatic duct cannot be followed through this region of non profusion. There is no organized fluid collection. Free fluid extends along the LEFT and LEFT pericolic gutter. The volume of fluid on LEFT and RIGHT is decreased compared to prior. Fluid also extends into the pancreatic gastric hepatic ligament similar to prior. Spleen: There is a filling defect within the splenic vein proximally (Image 45, series 10). The more distal splenic vein is diminished in  caliber and difficult to follow. Concern for thrombosis Portal veins are patent. Adrenals/urinary tract: Adrenal glands and kidneys are normal. The ureters and bladder normal. Stomach/Bowel: Stomach is decompressed. Evidence of prior gastric bypass surgery (sleeve bypass) no small bowel dilatation . Vascular/Lymphatic: The no evidence of complication of the celiac trunk or SMA branches. Portal vein is patent. Splenic vein thrombosis of above Musculoskeletal: No aggressive osseous lesion. IMPRESSION: 1. Majority of the pancreatic body is hypoperfused consistent with pancreatic necrosis. Only the tail and head of the pancreas perfuse normally. 2. Mild-to-moderate increased in pancreatic edema compared to prior. 3. Interval decrease in pleural fluid on the LEFT and RIGHT pericolic gutter. 4. No organized fluid collections. 5. Thrombus within the proximal splenic vein. The distal splenic vein is poorly imaged and may be thrombosed. Portal veins are patent 6. New bilateral pleural effusions and dense basilar atelectasis. These results will be called to the ordering clinician or representative by the Radiologist Assistant, and communication documented in the PACS or zVision Dashboard. Electronically Signed   By: Suzy Bouchard M.D.   On: 04/17/2017 12:07     STUDIES:  CT Abd/Pelvis 6/25 > Enlarged edematous pancreas with moderate-to-marked fluid and edema surrounding the pancreas consistent with acute pancreatitis. No focal or organized fluid collections are seen at this time. Small amount of pericholecystic fluid, uncertain if this is related to generalized fluid in the upper abdomen from pancreatitis or if this is due to inflammation of the gallbladder. Suggest correlation with gallbladder ultrasound. Small free fluid in the abdomen and pelvis. Fatty liver Focal hypodensity in the lateral aspect of the left breast, felt related to history of recent breast excision. [place] ABD 6/25 >  enlarged pancreas consistent  with pancreatitis, probable sludge within the gallbladder with upper normal gallbladder wall thickness, no definite gallstones, hypoechoic region within the liver 3.5cm  CT A/P 6/29 > Majority of the pancreatic body is hypoperfused consistent with pancreatic necrosis. Only the tail and head of the pancreas perfuse normally. Mild-to-moderate increased in pancreatic edema compared to prior. Interval decrease in pleural fluid on the LEFT and RIGHT pericolic gutter. No organized fluid collections. Thrombus within the proximal splenic vein. The distal splenic vein is poorly imaged and may be thrombosed. Portal veins are patent New bilateral pleural effusions and dense basilar atelectasis. ECHO 6/29 > EF 65-70%   CULTURES: BCx2 6/26 > negative  BCx2 6/29 >>   ANTIBIOTICS: Zosyn 6/26 >>   SIGNIFICANT EVENTS: 6/24  Admit with abd pain, N/V/D > concern for pancreatitis   LINES/TUBES: PICC Right Brachial 6/26 >>   DISCUSSION: 36 y/o F admitted 6/24 with abdominal pain, N/V/D.  Found to have acute pancreatitis.  Ongoing SIRS physiology > pt transferred to [facility] for further evaluation.   ASSESSMENT / PLAN:  PULMONARY A: Acute Hypoxic Respiratory Failure - suspect multifactorial in the setting of morbid obesity, non-compliance with CPAP, inflammatory response from pancreatitis  OSA - not compliant with CPAP  P:   O2 as needed to support sats >92% CPAP QHS & PRN daytime sleep  Intermittent CXR Push pulmonary hygiene - IS, mobilize   CARDIOVASCULAR A:  Sepsis - secondary to pancreatitis  Tachycardia - reactive HTN  P:  ICU monitoring  Labetalol 20 mg IV Q8  RENAL A:   Hypokalemia Hypomagnesemia  P:   Trend BMP / urinary output Replace electrolytes as indicated LR @ 125 ml/hr  Avoid nephrotoxic agents, ensure adequate renal perfusion  GASTROINTESTINAL A:   Necrotizing Acute Pancreatitis -  LFT's elevated on admit (?gallstones/microlithiasis/sludge).  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 ABD Nausea / Vomiting / Diarrhea Hx Sleeve Gastrectomy P:   Trend lipase, LFT's  PRN zofran  Clear Liquid diet  Start post-pyloric trickle feeding  Consult GI in AM > ?need for EUS   HEMATOLOGIC A:   Anemia - without acute bleeding  P:  Trend CBC Heparin SQ for DVT prophylaxis   INFECTIOUS A:   Acute Pancreatitis  P:   Trend WBC and Fever Curve Follow culture data Continue Zosyn  Assess HIV   ENDOCRINE A:   At Risk Hyper/Hypoglycemia  P:   Trend Glucose  SSI   NEUROLOGIC A:   Pain - in setting of abdominal pain  H/O Neuropathy  P:   Continue PCA pump    FAMILY  - Updates: Mother updated at bedside   - Inter-disciplinary family meet or Palliative Care meeting due by: 04/24/2017     CC Time: 54 minutes   Will call [facility] to pick patient up 6/30 am and PCCM will follow in consult   Pulmonary and [place] Pager: [phone]  04/17/2017, 2:07 PM

## 2017-04-17 NOTE — Progress Notes (Signed)
Pt has gone to radiology for placement of dobhoff  feeding tube.

## 2017-04-17 NOTE — Progress Notes (Signed)
Patient remains tachycardic to 130s-140s after 5-6 days of inpatient treatment, having worsening fevers, ongoing tachypnea (fortunately without respiratory distress), and intermittently somnolence.  Her CT demonstrates necrotizing pancreatitis with nonvisualization of the pancreatic duct and a splenic vein thrombosis.  Her CVP is 5 suggesting ARDS as the etiology of her dyspnea.  We are continuing IVF.  She is at risk of decompensation and need for intubation.  There is no GI coverage at [facility] this weekend.  Case discussed today with Dr. Titus Mould who accepted patient for ICU transfer with him as the attending (transfer of care to [facility]) until she starts showing signs of clinical improvement.  Family requested transfer to [facility], but there are no beds available.  I also called [facility] and [facility], similarly full.  Will transfer to [facility] ICU.

## 2017-04-17 NOTE — Progress Notes (Addendum)
PROGRESS NOTE  Kaylee Montgomery  ZOX:[id number] DOB: 1981/01/04 DOA: 04/12/2017 PCP: [name], MD  Brief Narrative:   36 year old female with morbid obesity, hypertension, pituitary tumor, OSA not complainant with CPAP who presented to [facility] with worsening abdominal pain in the epigastric area, nausea and vomiting for past few days piror to the admission. Pain was 10/10 in intensity at worst, burning like sensation and associated with ongoing nausea and vomiting. She was found to have elevated LFT's and lipase in 6000 range. Her CT and abd US showed acute pancreatitis.  She continues to have tachycardia to the 130s-140s, fevers, leukocytosis, and likely ARDS which are not improving.  Her abdominal pain has improved somewhat on PCA.    Assessment & Plan:   Principal Problem:   Acute pancreatitis Active Problems:   PITUITARY ADENOMA   Morbid obesity (HCC)   Seasonal and perennial allergic rhinitis   Prediabetes   Obstructive sleep apnea   S/P laparoscopic sleeve gastrectomy   Transaminitis   Anemia  SIRS secondary to moderate to severe acute idiopathic pancreatitis with ongoing tachycardia and pulmonary edema.  NO gallstones seen on CT or abd [place] at admission.  Denies EtOH.  Triglycerides normal.  Not on culprit medications.   - Lipase and LFTs trending down - Continue zosyn - CT pancreas today due to ongoing SIRS on day 5:  Necrotizing pancreatitis with splenic vein thrombus -  Start post-pyloric tube feeds  Splenic vein thrombus - will not anticoagulate due to risk of hemorrhagic conversion of necrotizing pancreatitis.    Acute hypoxic respiratory failure likely secondary to ARDS/third spacing from pancreatitis -  Did not tolerate CPAP or bipap -  Encouraged incentive spirometry -  Check CVP: 5 > continue IVF -  ECHO:  Preserved EF.  LVH, unable to determine diastolic dysfunction -  ABG:  Large Aa gradient -  Continue IVF at 125 ml/h    Hypokalemia, recurrent -   Magnesium:  1.6 > add magnesium sulfate 4gm IV once -  Increase potassium to BID x 2 days   Persistent sinus Tachycardia / Uncontrolled hypertension due to pain and acute pancreatitis, HR still elevated.  Minimal improvement - Cardiology signed off - continue labetalol to 20 mg IV Q 8 hours with prn doses    Morbid obesity due to excess calories (HCC) - Body mass index is 38.7 kg/m. - Counseled on nutrition  - S/P laparoscopic sleeve gastrectomy  Dark urine:  UA consistent with ATN.  Should gradually improve.  Creatinine is 0.62.  Making more than 0.5ml/kg/h uop  Normocytic anemia, acute phase reactant, no need for blood transfusion at this time.  -  Iron studies will be abnl in setting of inflammation so will defer at this time -  check B12, folate -  TSH wnl -  Occult stool may be positive secondary to pancreatitis and not accurate at this time.  Monitor for gross blood in stools -  Repeat hgb in AM  DVT prophylaxis:  Change to lovenox  Code Status:  full Family Communication:  Patient and her mother Disposition Plan:  Pending.  Still serious condition with ongoing tachycardia, acute respiratory failure    Consultants:   GI  Cardiology  Procedures:  none  Antimicrobials:  Anti-infectives    Start     Dose/Rate Route Frequency Ordered Stop   04/14/17 1200  piperacillin-tazobactam (ZOSYN) IVPB 3.375 g  Status:  Discontinued     3.375 g 12.5 mL/hr over 240 Minutes  Intravenous Every 8 hours 04/14/17 1142 04/15/17 1424       Subjective: Was given xopenex nebs overnight due to dyspnea.  Abdominal pain may be marginally improved.  Denies nausea.  No BM in 6 days.  Continues to have third spacing  Objective: Vitals:   04/17/17 0400 04/17/17 0500 04/17/17 0545 04/17/17 0643  BP: (!) 144/93 (!) 157/81    Pulse:      Resp: (!) 21 (!) 35  (!) 31  Temp:   99.8 F (37.7 C)   TempSrc:   Oral   SpO2: 99%   96%  Weight:  108.7 kg (239 lb 10.2 oz)    Height:         Intake/Output Summary (Last 24 hours) at 04/17/17 0820 Last data filed at 04/16/17 1900  Gross per 24 hour  Intake             1897 ml  Output             2250 ml  Net             -353 ml   Filed Weights   04/13/17 0251 04/16/17 0625 04/17/17 0500  Weight: 99.1 kg (218 lb 7.6 oz) 109.8 kg (242 lb 1 oz) 108.7 kg (239 lb 10.2 oz)    Examination:  General exam:  Adult female, again falling asleep during interview.  PCO2 on PCA is in 40s HEENT:  Normocephalic,  PCA monitor in nares, MMM Respiratory system:  Diminished bilateral breath sounds with wheeze, no rales or rhonchi Cardiovascular system:  Regular but tachycardic to 712W, 2/6 systolic murmur Gastrointestinal system:  Hypoactive BS, moderately distended, TTP in the epigastrium without rebound or guarding MSK:  Normal tone and bulk, hands with 1+ nonpitting edema.   Neuro: grossly moves all extremities    Data Reviewed: I have personally reviewed following labs and imaging studies  CBC:  Recent Labs Lab 04/12/17 1834  04/13/17 0432 04/14/17 0429 04/15/17 0507 04/16/17 0623 04/16/17 1052 04/17/17 0433  WBC  --   < > 12.8* 16.2* 16.2* 13.8*  --  16.8*  NEUTROABS 10.8*  --   --   --  12.6* 10.7*  --   --   HGB  --   < > 16.2* 15.0 11.8* 8.7* 9.9* 9.4*  HCT  --   < > 49.2* 46.5* 37.3 27.1* 31.0* 29.5*  MCV  --   < > 77.2* 80.4 81.8 81.4  --  81.3  PLT  --   < > 364 248 190 161  --  186  < > = values in this interval not displayed. Basic Metabolic Panel:  Recent Labs Lab 04/14/17 0429 04/15/17 0507 04/15/17 1035 04/16/17 0623 04/17/17 0433 04/17/17 0450  NA 139 141 140 140 141  --   K 4.5 4.4 4.0 3.4* 3.2*  --   CL 110 112* 109 107 103  --   CO2 20* 25 26 25  32  --   GLUCOSE 261* 152* 169* 116* 135*  --   BUN 15 15 13 8 7   --   CREATININE 0.85 0.82 0.83 0.56 0.62  --   CALCIUM 8.2* 8.4* 8.5* 7.7* 7.9*  --   MG  --   --   --   --   --  1.6*   GFR: Estimated Creatinine Clearance: 113.8 mL/min (by C-G  formula based on SCr of 0.62 mg/dL). Liver Function Tests:  Recent Labs Lab 04/14/17 0429 04/15/17 0507 04/15/17 1035 04/16/17  8756 04/17/17 0433  AST 123* 50* 46* 28 25  ALT 277* 145* 139* 78* 69*  ALKPHOS 74 55 56 42 51  BILITOT 2.3* 2.2* 2.4* 1.5* 1.6*  PROT 5.7* 5.5* 5.8* 4.8* 5.6*  ALBUMIN 2.9* 2.6* 2.7* 2.2* 2.5*    Recent Labs Lab 04/12/17 1858 04/13/17 0432 04/14/17 0429 04/15/17 0507 04/16/17 0623  LIPASE 6,774* 1,167* 977* 354* 76*   No results for input(s): AMMONIA in the last 168 hours. Coagulation Profile: No results for input(s): INR, PROTIME in the last 168 hours. Cardiac Enzymes: No results for input(s): CKTOTAL, CKMB, CKMBINDEX, TROPONINI in the last 168 hours. BNP (last 3 results) No results for input(s): PROBNP in the last 8760 hours. HbA1C: No results for input(s): HGBA1C in the last 72 hours. CBG:  Recent Labs Lab 04/16/17 0941 04/16/17 1106 04/16/17 1606 04/16/17 2132 04/17/17 0742  GLUCAP 154* 136* 135* 129* 166*   Lipid Profile: No results for input(s): CHOL, HDL, LDLCALC, TRIG, CHOLHDL, LDLDIRECT in the last 72 hours. Thyroid Function Tests: No results for input(s): TSH, T4TOTAL, FREET4, T3FREE, THYROIDAB in the last 72 hours. Anemia Panel: No results for input(s): VITAMINB12, FOLATE, FERRITIN, TIBC, IRON, RETICCTPCT in the last 72 hours. Urine analysis:    Component Value Date/Time   COLORURINE YELLOW 04/16/2017 1448   APPEARANCEUR CLEAR 04/16/2017 1448   LABSPEC 1.006 04/16/2017 1448   PHURINE 7.0 04/16/2017 1448   GLUCOSEU NEGATIVE 04/16/2017 1448   HGBUR SMALL (A) 04/16/2017 1448   BILIRUBINUR NEGATIVE 04/16/2017 1448   BILIRUBINUR neg 10/24/2013 1255   KETONESUR NEGATIVE 04/16/2017 1448   PROTEINUR NEGATIVE 04/16/2017 1448   UROBILINOGEN 1.0 10/24/2013 1255   UROBILINOGEN 0.2 10/21/2013 0035   NITRITE NEGATIVE 04/16/2017 1448   LEUKOCYTESUR NEGATIVE 04/16/2017 1448   Sepsis  Labs: @LABRCNTIP (procalcitonin:4,lacticidven:4)  ) Recent Results (from the past 240 hour(s))  MRSA PCR Screening     Status: None   Collection Time: 04/13/17  2:50 AM  Result Value Ref Range Status   MRSA by PCR NEGATIVE NEGATIVE Final    Comment:        The GeneXpert MRSA Assay (FDA approved for NASAL specimens only), is one component of a comprehensive MRSA colonization surveillance program. It is not intended to diagnose MRSA infection nor to guide or monitor treatment for MRSA infections.   Culture, blood (x 2)     Status: None (Preliminary result)   Collection Time: 04/14/17 11:00 PM  Result Value Ref Range Status   Specimen Description SITE NOT SPECIFIED  Final   Special Requests Blood Culture adequate volume  Final   Culture NO GROWTH 2 DAYS  Final   Report Status PENDING  Incomplete  Culture, blood (x 2)     Status: None (Preliminary result)   Collection Time: 04/14/17 11:06 PM  Result Value Ref Range Status   Specimen Description SITE NOT SPECIFIED  Final   Special Requests Blood Culture adequate volume  Final   Culture NO GROWTH 2 DAYS  Final   Report Status PENDING  Incomplete      Radiology Studies: No results found.   Scheduled Meds: . ALPRAZolam  0.25 mg Oral BID BM & HS  . chlorhexidine  15 mL Mouth Rinse BID  . Chlorhexidine Gluconate Cloth  6 each Topical Daily  . heparin  7,500 Units Subcutaneous Q8H  . HYDROmorphone   Intravenous Q4H  . insulin aspart  0-5 Units Subcutaneous QHS  . insulin aspart  0-9 Units Subcutaneous TID WC  . labetalol  20 mg Intravenous Q8H  . mouth rinse  15 mL Mouth Rinse q12n4p  . ondansetron (ZOFRAN) IV  4 mg Intravenous TID WC & HS  . pantoprazole  40 mg Oral BID AC  . potassium chloride  40 mEq Oral BID  . senna-docusate  1 tablet Oral BID  . sodium chloride flush  10-40 mL Intracatheter Q12H   Continuous Infusions: . lactated ringers 75 mL/hr at 04/16/17 1432  . magnesium sulfate 1 - 4 g bolus IVPB        LOS: 4 days    Time spent: 30 min    Janece Canterbury, MD [facility] Pager [phone]  If 7PM-7AM, please contact night-coverage [email or web address] Password [email or web address] 04/17/2017, 8:20 AM

## 2017-04-17 NOTE — Progress Notes (Signed)
*  PRELIMINARY RESULTS* Echocardiogram 2D Echocardiogram has been performed.  Kaylee Montgomery 04/17/2017, 10:34 AM

## 2017-04-17 NOTE — Progress Notes (Signed)
Pt refuse to wear CPAP machine tonight RT will continue to montior on 3lpm cann

## 2017-04-17 NOTE — Progress Notes (Signed)
Pharmacy Antibiotic Note  Kaylee Montgomery is a 36 y.o. female admitted on 04/12/2017 with fever, pancreatic necrosis.  Pharmacy has been consulted for zosyn dosing.  Plan: Zosyn 3.375g IV q8h (4 hour infusion).  F/U cxs and clinical progress  Height: 5\' 3"  (160 cm) Weight: 239 lb 10.2 oz (108.7 kg) IBW/kg (Calculated) : 52.4  Temp (24hrs), Avg:100 F (37.8 C), Min:99.1 F (37.3 C), Max:101.9 F (38.8 C)   Recent Labs Lab 04/13/17 0432 04/14/17 0429 04/14/17 0959 04/15/17 0507 04/15/17 0830 04/15/17 1035 04/16/17 0623 04/17/17 0433  WBC 12.8* 16.2*  --  16.2*  --   --  13.8* 16.8*  CREATININE 0.56 0.85  --  0.82  --  0.83 0.56 0.62  LATICACIDVEN  --   --  3.6*  --  1.5  --   --   --     Estimated Creatinine Clearance: 113.8 mL/min (by C-G formula based on SCr of 0.62 mg/dL).    Allergies  Allergen Reactions  . Bromocriptine Mesylate Nausea Only  . Depo-Medrol [Methylprednisolone Acetate] Itching and Rash  . Imitrex [Sumatriptan] Rash    Antimicrobials this admission: Zosyn 6/26-6/27 now restarted 6/29>>   Dose adjustments this admission: N/A  Microbiology results: 6/26 BCx: ngtd 6/25 MRSA PCR: negative  Thank you for allowing pharmacy to be a part of this patient's care.  Isac Sarna, BS [place], [place] Clinical Pharmacist Pager [phone] 04/17/2017 12:37 PM

## 2017-04-17 NOTE — Progress Notes (Signed)
Pt awake and oriented.  Time out of [facility] - [facility] transporting - report update given.  Elvina Sidle was called and informed of departure from [facility].

## 2017-04-17 NOTE — Progress Notes (Signed)
Pharmacy Antibiotic Note  Kaylee Montgomery is a 36 y.o. female admitted on 04/12/2017 with pancreatitis.  Pharmacy has been consulted for zosyn dosing.  Plan: Zosyn 3.375g IV q8h (4 hour infusion).  F/u scr/cultures  Height: 5\' 4"  (162.6 cm) Weight: 239 lb 10.2 oz (108.7 kg) IBW/kg (Calculated) : 54.7  Temp (24hrs), Avg:101.1 F (38.4 C), Min:99.2 F (37.3 C), Max:102 F (38.9 C)   Recent Labs Lab 04/13/17 0432 04/14/17 0429 04/14/17 0959 04/15/17 0507 04/15/17 0830 04/15/17 1035 04/16/17 0623 04/17/17 0433  WBC 12.8* 16.2*  --  16.2*  --   --  13.8* 16.8*  CREATININE 0.56 0.85  --  0.82  --  0.83 0.56 0.62  LATICACIDVEN  --   --  3.6*  --  1.5  --   --   --     Estimated Creatinine Clearance: 116 mL/min (by C-G formula based on SCr of 0.62 mg/dL).    Allergies  Allergen Reactions  . Bromocriptine Mesylate Nausea Only  . Depo-Medrol [Methylprednisolone Acetate] Itching and Rash  . Imitrex [Sumatriptan] Rash    Antimicrobials this admission: 6/29 zosyn >>    >>   Dose adjustments this admission:   Microbiology results:  BCx:   UCx:    Sputum:    MRSA PCR:   Thank you for allowing pharmacy to be a part of this patient's care.  Dorrene German 04/17/2017 11:06 PM

## 2017-04-18 ENCOUNTER — Inpatient Hospital Stay (HOSPITAL_COMMUNITY): Payer: 59

## 2017-04-18 ENCOUNTER — Encounter (HOSPITAL_COMMUNITY): Payer: Self-pay

## 2017-04-18 DIAGNOSIS — K8591 Acute pancreatitis with uninfected necrosis, unspecified: Secondary | ICD-10-CM

## 2017-04-18 DIAGNOSIS — E876 Hypokalemia: Secondary | ICD-10-CM

## 2017-04-18 DIAGNOSIS — J9601 Acute respiratory failure with hypoxia: Secondary | ICD-10-CM

## 2017-04-18 LAB — GLUCOSE, CAPILLARY
Glucose-Capillary: 108 mg/dL — ABNORMAL HIGH (ref 65–99)
Glucose-Capillary: 118 mg/dL — ABNORMAL HIGH (ref 65–99)
Glucose-Capillary: 119 mg/dL — ABNORMAL HIGH (ref 65–99)
Glucose-Capillary: 120 mg/dL — ABNORMAL HIGH (ref 65–99)
Glucose-Capillary: 123 mg/dL — ABNORMAL HIGH (ref 65–99)
Glucose-Capillary: 126 mg/dL — ABNORMAL HIGH (ref 65–99)
Glucose-Capillary: 171 mg/dL — ABNORMAL HIGH (ref 65–99)

## 2017-04-18 LAB — COMPREHENSIVE METABOLIC PANEL
ALT: 56 U/L — ABNORMAL HIGH (ref 14–54)
AST: 35 U/L (ref 15–41)
Albumin: 2.3 g/dL — ABNORMAL LOW (ref 3.5–5.0)
Alkaline Phosphatase: 62 U/L (ref 38–126)
Anion gap: 8 (ref 5–15)
BUN: 7 mg/dL (ref 6–20)
CO2: 31 mmol/L (ref 22–32)
Calcium: 7.6 mg/dL — ABNORMAL LOW (ref 8.9–10.3)
Chloride: 101 mmol/L (ref 101–111)
Creatinine, Ser: 0.57 mg/dL (ref 0.44–1.00)
GFR calc Af Amer: >60 mL/min (ref >60)
GFR calc non Af Amer: >60 mL/min (ref >60)
Glucose, Bld: 197 mg/dL — ABNORMAL HIGH (ref 65–99)
Potassium: 3.2 mmol/L — ABNORMAL LOW (ref 3.5–5.1)
Sodium: 140 mmol/L (ref 135–145)
Total Bilirubin: 0.9 mg/dL (ref 0.3–1.2)
Total Protein: 5.4 g/dL — ABNORMAL LOW (ref 6.5–8.1)

## 2017-04-18 LAB — CBC WITH DIFFERENTIAL/PLATELET
Basophils Absolute: 0 10*3/uL (ref 0.0–0.1)
Basophils Relative: 0 %
Eosinophils Absolute: 0.2 10*3/uL (ref 0.0–0.7)
Eosinophils Relative: 1 %
HCT: 27.9 % — ABNORMAL LOW (ref 36.0–46.0)
Hemoglobin: 8.9 g/dL — ABNORMAL LOW (ref 12.0–15.0)
Lymphocytes Relative: 8 %
Lymphs Abs: 1.3 10*3/uL (ref 0.7–4.0)
MCH: 25.7 pg — ABNORMAL LOW (ref 26.0–34.0)
MCHC: 31.9 g/dL (ref 30.0–36.0)
MCV: 80.6 fL (ref 78.0–100.0)
Monocytes Absolute: 2.2 10*3/uL — ABNORMAL HIGH (ref 0.1–1.0)
Monocytes Relative: 13 %
Neutro Abs: 13.1 10*3/uL — ABNORMAL HIGH (ref 1.7–7.7)
Neutrophils Relative %: 78 %
Platelets: 213 10*3/uL (ref 150–400)
RBC: 3.46 MIL/uL — ABNORMAL LOW (ref 3.87–5.11)
RDW: 14.7 % (ref 11.5–15.5)
WBC: 16.8 10*3/uL — ABNORMAL HIGH (ref 4.0–10.5)

## 2017-04-18 LAB — MAGNESIUM
Magnesium: 2 mg/dL (ref 1.7–2.4)
Magnesium: 1.9 mg/dL (ref 1.7–2.4)

## 2017-04-18 LAB — PHOSPHORUS
Phosphorus: 2.2 mg/dL — ABNORMAL LOW (ref 2.5–4.6)
Phosphorus: 2.3 mg/dL — ABNORMAL LOW (ref 2.5–4.6)

## 2017-04-18 LAB — HIV ANTIBODY (ROUTINE TESTING W REFLEX): HIV Screen 4th Generation wRfx: NONREACTIVE

## 2017-04-18 LAB — PROCALCITONIN: Procalcitonin: 0.91 ng/mL

## 2017-04-18 MED ORDER — POTASSIUM PHOSPHATES 15 MMOLE/5ML IV SOLN
20.0000 meq | Freq: Once | INTRAVENOUS | Status: AC
  Administered 2017-04-18: 20 meq via INTRAVENOUS
  Filled 2017-04-18: qty 4.55

## 2017-04-18 MED ORDER — POTASSIUM CHLORIDE 10 MEQ/100ML IV SOLN
10.0000 meq | INTRAVENOUS | Status: AC
  Administered 2017-04-18 (×3): 10 meq via INTRAVENOUS
  Filled 2017-04-18 (×3): qty 100

## 2017-04-18 MED ORDER — HEPARIN SODIUM (PORCINE) 5000 UNIT/ML IJ SOLN
5000.0000 [IU] | Freq: Three times a day (TID) | INTRAMUSCULAR | Status: DC
  Administered 2017-04-18 – 2017-04-28 (×27): 5000 [IU] via SUBCUTANEOUS
  Filled 2017-04-18 (×27): qty 1

## 2017-04-18 MED ORDER — SODIUM CHLORIDE 0.9 % IV SOLN
1.0000 g | Freq: Three times a day (TID) | INTRAVENOUS | Status: DC
  Administered 2017-04-18 – 2017-04-19 (×3): 1 g via INTRAVENOUS
  Filled 2017-04-18 (×4): qty 1

## 2017-04-18 NOTE — Consult Note (Signed)
Referring Provider:  Dr. Shela Nevin Primary Care Physician:  [name], MD Primary Gastroenterologist:  Althia Forts transferred from [facility]  Reason for Consultation:  Acute necrotizing pancreatitis  HPI: Kaylee Montgomery is a 36 y.o. female transferred from [place] to [facility] for further management of acute pancreatitis with pancreatic necrosis.  Patient initially presented to [facility] on 04/13/2017 with abdominal pain, nausea and vomiting. Was found to have elevated lipase. Issue CT scan showed acute pancreatitis without any complication. Patient was seen by Dr. Oneida Alar on 04/12/2017. She was treated with IV hydration.patient continued to have abdominal pain and repeat CT scan was ordered yesterday which showed pancreatic necrosis.Subsequently she was started on Zosyn and was transferred here for further management.  Patient seen and examined at the bedside. Mother available in the room. According to mother, she was initially doing better and subsequently started having worsening pain few days ago. Patient is complaining of fever and chills as well. She has trouble swallowing large pills but she is willing to try the diet. She continues to have abdominal pain and distention. Denied any blood in the stool or black stool. Denied any vomiting.   Past Medical History:  Diagnosis Date  . Breast mass 02/2017  . Dental crown present   . Hypertension    states BP fluctuates; has been on med. x 1 yr.  . Migraines   . Pituitary adenoma ([place])   . Seasonal allergies   . Sleep apnea    no CPAP use in > 6 mos.    Past Surgical History:  Procedure Laterality Date  . BREAST LUMPECTOMY WITH RADIOACTIVE SEED LOCALIZATION Left 03/05/2017   Procedure: LEFT BREAST LUMPECTOMY WITH RADIOACTIVE SEED LOCALIZATION;  Surgeon: Donnie Mesa, MD;  Location: [facility];  Service: General;  Laterality: Left;  . CESAREAN SECTION    . LAPAROSCOPIC GASTRIC  SLEEVE RESECTION N/A 02/18/2016   Procedure: LAPAROSCOPIC GASTRIC SLEEVE RESECTION, UPPER ENDO;  Surgeon: Greer Pickerel, MD;  Location: [facility];  Service: General;  Laterality: N/A;  . 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 TONSILLECTOMY  age 34    Prior to Admission medications   Medication Sig Start Date End Date Taking? Authorizing Provider  cyclobenzaprine (FLEXERIL) 5 MG tablet Take 1 tablet (5 mg total) by mouth at bedtime. 04/09/17  Yes Kathrynn Ducking, MD  gabapentin (NEURONTIN) 300 MG capsule Take 1 capsule (300 mg total) by mouth at bedtime. 02/20/16  Yes Greer Pickerel, MD  metoprolol succinate (TOPROL XL) 50 MG 24 hr tablet Take 1 tablet (50 mg total) by mouth daily. Take with or immediately following a meal. 11/18/16  Yes Kathrynn Ducking, MD    Scheduled Meds: . ALPRAZolam  0.25 mg Oral Daily  . chlorhexidine  15 mL Mouth Rinse BID  . Chlorhexidine Gluconate Cloth  6 each Topical Daily  . feeding supplement (VITAL HIGH PROTEIN)  1,000 mL Per Tube Q24H  . heparin  7,500 Units Subcutaneous Q8H  . HYDROmorphone   Intravenous Q4H  . insulin aspart  2-6 Units Subcutaneous Q4H  . labetalol  20 mg Intravenous Q8H  . lipase/protease/amylase  36,000 Units Oral TID BM  . lipase/protease/amylase  72,000 Units Oral TID WC  . mouth rinse  15 mL Mouth Rinse q12n4p  . ondansetron (ZOFRAN) IV  4 mg Intravenous TID WC & HS  . pantoprazole (PROTONIX) IV  40 mg Intravenous Q12H  .  sodium chloride flush  10-40 mL Intracatheter Q12H   Continuous Infusions: . lactated ringers 125 mL/hr at 04/18/17 0600  . piperacillin-tazobactam (ZOSYN)  IV 3.375 g (04/18/17 0647)   PRN Meds:.acetaminophen, diphenhydrAMINE **OR** diphenhydrAMINE, labetalol, levalbuterol, naloxone **AND** sodium chloride flush, ondansetron (ZOFRAN) IV, senna-docusate, sodium chloride flush  Allergies as of 04/12/2017 - Review Complete 04/12/2017  Allergen  Reaction Noted  . Bromocriptine mesylate Nausea Only 07/03/2008  . Depo-medrol [methylprednisolone acetate] Itching and Rash 01/29/2015  . Imitrex [sumatriptan] Rash 03/29/2015    Family History  Problem Relation Age of Onset  . Heart disease Father        stent  . Migraines Father   . Migraines Son   . Diabetes Maternal Grandfather   . Colon cancer Neg Hx     Social History   Social History  . Marital status: Single    Spouse name: N/A  . Number of children: 1  . Years of education: N/A   Occupational History  . STUDENT    . [employer]   Social History Main Topics  . Smoking status: Never Smoker  . Smokeless tobacco: Never Used  . Alcohol use 0.0 oz/week     Comment: occasionally, last drink in April 2018   . Drug use: No  . Sexual activity: Not on file   Other Topics Concern  . Not on file   Social History Narrative   Patient is right handed.   Patient drinks 1-2 cups of caffeine daily.    Review of Systems: Review of Systems  Constitutional: Positive for chills, fever and malaise/fatigue.  HENT: Positive for sore throat. Negative for hearing loss and tinnitus.   Eyes: Negative for blurred vision and double vision.  Respiratory: Positive for shortness of breath. Negative for cough and hemoptysis.   Cardiovascular: Negative for chest pain and palpitations.  Gastrointestinal: Positive for abdominal pain, heartburn and nausea. Negative for blood in stool, melena and vomiting.  Genitourinary: Negative for dysuria and urgency.  Musculoskeletal: Positive for back pain. Negative for myalgias.  Skin: Negative for rash.  Neurological: Positive for weakness. Negative for focal weakness and seizures.  Endo/Heme/Allergies: Does not bruise/bleed easily.  Psychiatric/Behavioral: Negative for hallucinations and suicidal ideas.    Physical Exam: Vital signs: Vitals:   04/18/17 0700 04/18/17 0800  BP: (!) 176/116   Pulse:    Resp: 20 (!) 27   Temp:  (!) 101.3 F (38.5 C)   Last BM Date: 04/17/17 Physical Exam  Constitutional: She is oriented to person, place, and time. She appears well-developed and well-nourished. No distress.  HENT:  Head: Normocephalic and atraumatic.  DH tube in place  Eyes: EOM are normal. Pupils are equal, round, and reactive to light. No scleral icterus.  Neck: Normal range of motion. Neck supple. No thyromegaly present.  Cardiovascular: Regular rhythm and normal heart sounds.   Tachycardia  Pulmonary/Chest: Effort normal. She has rales.  Abdominal: Bowel sounds are normal. She exhibits distension. There is tenderness. There is no rebound and no guarding.  Generalized abdominal distention with epigastric / left and right upper quadrant tenderness to palpation  Musculoskeletal: Normal range of motion. She exhibits edema.  Neurological: She is alert and oriented to person, place, and time.  Skin: Skin is warm. No erythema.  Psychiatric: She has a normal mood and affect. Her behavior is normal.    GI:  Lab Results:  Recent Labs  04/16/17 0623 04/16/17 1052 04/17/17 0433  WBC 13.8*  --  16.8*  HGB 8.7* 9.9* 9.4*  HCT 27.1* 31.0* 29.5*  PLT 161  --  186   BMET  Recent Labs  04/15/17 1035 04/16/17 0623 04/17/17 0433  NA 140 140 141  K 4.0 3.4* 3.2*  CL 109 107 103  CO2 26 25 32  GLUCOSE 169* 116* 135*  BUN 13 8 7   CREATININE 0.83 0.56 0.62  CALCIUM 8.5* 7.7* 7.9*   LFT  Recent Labs  04/17/17 0433  PROT 5.6*  ALBUMIN 2.5*  AST 25  ALT 69*  ALKPHOS 51  BILITOT 1.6*   PT/INR No results for input(s): LABPROT, INR in the last 72 hours.   Studies/Results: Dg Fluoro Rm 1-60 Min  Result Date: 04/17/2017 INDICATION: Acute necrotizing pancreatitis EXAM: FLOURO RM 1-60 MIN COMPARISON:  CT abdomen and pelvis 04/17/2017 CONTRAST:  None FLUOROSCOPY TIME:  1 minutes. 10 seconds. COMPLICATIONS: None immediate PROCEDURE: The feeding tube was lubricated with viscous lidocaine  inserted into the right nostril. Under intermittent fluoroscopic guidance, the feeding tube was advanced through the stomach, through the duodenum with tipultimately terminating over the expected location of the duodenal - jejunal junction. A spot fluoroscopic image was saved for documentation purposes. The tube was affixed to the patient's nose with tape. The patient tolerated the procedure well without immediate postprocedural complication. FINDINGS: As above IMPRESSION: Successful fluoroscopic guided placement of a feeding tube with tip terminating over the duodenojejunal junction. The tube is ready for immediate use. Electronically Signed   By: Lavonia Dana M.D.   On: 04/17/2017 17:42   Ct Pancreas Abd W/wo  Result Date: 04/17/2017 CLINICAL DATA:  Pancreatitis. EXAM: CT ABDOMEN WITHOUT AND WITH CONTRAST TECHNIQUE: Multidetector CT imaging of the abdomen was performed following the standard protocol before and following the bolus administration of intravenous contrast. CONTRAST:  100 mL Isovue COMPARISON:  None. FINDINGS: Lower chest: Bilateral pleural effusions and dense basilar atelectasis is new from comparison exam. Hepatobiliary: On arterial phase imaging there is a round well-circumscribed focus of hypoperfusion measuring 3.5 cm (image 48, series 4 )which is isodense to the liver on the venous phase imaging. There is additionally hypoperfusion to the more general posterior RIGHT hepatic lobe. These are felt benign vascular profusion anomalies. No enhancing hepatic lesion. No biliary duct dilatation. Small amount sludge within the gallbladder. Gallbladder is nondistended. Pancreas: Pancreas is edematous measuring 4 cm in diameter compared to 3.4 cm on comparison CT. There is hypoperfusion of a large portion of the body the pancreas measuring 4.0 x 8.8 cm (image 56, series 4). Only the head and tail of the pancreas perfuse normally. The pancreatic duct cannot be followed through this region of non  profusion. There is no organized fluid collection. Free fluid extends along the LEFT and LEFT pericolic gutter. The volume of fluid on LEFT and RIGHT is decreased compared to prior. Fluid also extends into the pancreatic gastric hepatic ligament similar to prior. Spleen: There is a filling defect within the splenic vein proximally (Image 45, series 10). The more distal splenic vein is diminished in caliber and difficult to follow. Concern for thrombosis Portal veins are patent. Adrenals/urinary tract: Adrenal glands and kidneys are normal. The ureters and bladder normal. Stomach/Bowel: Stomach is decompressed. Evidence of prior gastric bypass surgery (sleeve bypass) no small bowel dilatation . Vascular/Lymphatic: The no evidence of complication of the celiac trunk or SMA branches. Portal vein is patent. Splenic vein thrombosis of above Musculoskeletal: No aggressive osseous lesion. IMPRESSION: 1. Majority of the pancreatic body is hypoperfused  consistent with pancreatic necrosis. Only the tail and head of the pancreas perfuse normally. 2. Mild-to-moderate increased in pancreatic edema compared to prior. 3. Interval decrease in pleural fluid on the LEFT and RIGHT pericolic gutter. 4. No organized fluid collections. 5. Thrombus within the proximal splenic vein. The distal splenic vein is poorly imaged and may be thrombosed. Portal veins are patent 6. New bilateral pleural effusions and dense basilar atelectasis. These results will be called to the ordering clinician or representative by the Radiologist Assistant, and communication documented in the PACS or zVision Dashboard. Electronically Signed   By: Suzy Bouchard M.D.   On: 04/17/2017 12:07    Impression/Plan: - acute necrotizing pancreatitis. Initially diagnosed with acute pancreatitis on 04/13/2017 - Fever with tachycardia. Most likely reactive from underlying necrotizing pancreatitis. - proximal splenic vein thrombosis - abnormal LFTs. Negative  hepatitis panel. Normal AST. Recommendations ------------------------- - No clear etiology for her pancreatitis. Possible gallstone pancreatitis from sludge/microlithiasis.  Denied alcohol use since April. No new medications. Normal triglycerides on admission.no family history of chronic pancreatitis - Continue conservative management for now. Change antibiotic to  Imipenem  - nutrition consult for tube feeding management. Low-fat diet as tolerated. - Detailed discussion with mother regarding the overall prognosis and management options. She verbalized understanding. - Consider repeat CT scan in 1 week. GI will follow    LOS: 5 days   Otis Brace  MD, FACP 04/18/2017, 9:13 AM  Pager [phone] If no answer or after 5 PM call [phone]

## 2017-04-18 NOTE — Progress Notes (Addendum)
PROGRESS NOTE   Kaylee Montgomery  GNF:[id number]    DOB: 1981-06-02    DOA: 04/12/2017  PCP: Kerri Perches, MD   I have briefly reviewed patients previous medical records in [facility].  Brief Narrative:  36 year old female with PMH of HTN, migraine, pituitary adenoma, OSA noncompliant with CPAP, no history of alcohol abuse, initially admitted to [facility] for complaints of worsening abdominal pain, nausea and vomiting, diagnosed with acute pancreatitis (elevated LFTs, lipase 6000 range, CT and ultrasound abdomen confirming same). Subsequently worsened, ARDS suspected, clinical concern for decompensation/need for intubation, no GI coverage at [facility] on the weekend, hence transferred to [facility] ICU. CCM evaluated and felt patient appropriate under hospitalist care. [facility] consulted. CCM following.   Assessment & Plan:   Principal Problem:   Acute pancreatitis Active Problems:   PITUITARY ADENOMA   Morbid obesity (HCC)   Seasonal and perennial allergic rhinitis   Prediabetes   Obstructive sleep apnea   S/P laparoscopic sleeve gastrectomy   Transaminitis   Anemia   Abdominal pain   1. Acute necrotizing pancreatitis: Unclear etiology but could be from gallstone pancreatitis from sludge/microlithiasis. No history of alcohol abuse and no use since April 2018. Normal triglycerides on admission. No medications to suggest etiology. Denies personal history of gallstones. No family history of pancreatitis. [facility] follow-up appreciated. Starting low-fat diet as tolerated. For now continue postpyloric tube feeding. Changed IV Zosyn to Meropenum for better penetration/coverage. Follow-up CT in a week or if she worsens. Dilaudid PCA for pain management-better controlled. 2. SIRS: Secondary to problem #1. Continue aggressive IV fluid hydration, IV meropenem. Blood cultures 2 each from 6/26 and 6/29: Negative to date. Chest x-ray 6/30: No acute disease.  Incentive spirometry. Mobilize as tolerated. 3. Proximal splenic vein thrombosis: No anticoagulation recommended. Continue Lovenox for DVT prophylaxis. 4. Abnormal LFTs: Hepatitis panel negative. Per GI. 5. Acute respiratory failure with hypoxia: Related to SIRS, atelectasis, untreated OSA. Incentive spirometry. Supplemental oxygen as needed. CPAP if she is willing to try. Mobilize. 6. Essential hypertension: Uncontrolled. Worsened by acute illness. Continue scheduled IV labetalol 20 mg every 8 hourly. When necessary IV hydralazine. 7. Electrolytes (hypokalemia, hypomagnesemia, hypophosphatemia): Replace and follow as needed. 8. History of sleeve gastrectomy: 9. Anemia: Secondary to critical illness. Stable. Follow CBCs. 10. Morbid obesity/Body mass index is 40.83 kg/m.    DVT prophylaxis: Heparin Code Status: Full Family Communication: Discussed in detail with patient's mother at bedside. Disposition: Continue management in stepdown unit for several days.   Consultants:  PCCM [facility] Interventional radiology   Procedures:  Postpyloric NG tube PICC line  Antimicrobials:  Zosyn-discontinued. IV meropenem 6/30 >    Subjective: Feels somewhat better. Abdominal pain better controlled. Rated at 8/10 at its peak, improves to 4/10 after Dilaudid. Passing lots of gas. Had BM last night. Occasional dyspnea. Mild intermittent dry cough. No chest pain.   ROS: Denies dizziness, lightheadedness, nausea, vomiting or dysuria.  Objective:  Vitals:   04/18/17 0800 04/18/17 0900 04/18/17 1000 04/18/17 1100  BP: (!) 164/83 (!) 164/111 (!) 170/111 (!) 151/77  Pulse:      Resp: (!) 27 (!) 24 (!) 22 17  Temp: (!) 101.3 F (38.5 C)     TempSrc: Axillary     SpO2: 96% 96% 93% 95%  Weight:      Height:        Examination:  General exam: Pleasant young female, moderately built and morbidly obese, lying comfortably  propped up in bed. Respiratory system: Reduced breath sounds in the  bases but otherwise clear to auscultation. Respiratory effort normal. Cardiovascular system: S1 & S2 heard, regular tachycardia. No JVD, murmurs, rubs, gallops or clicks. No pedal edema. Telemetry: Sinus tachycardia in the 120s-130s Gastrointestinal system: Abdomen is nondistended, soft. Diffuse mild tenderness without peritoneal signs. No organomegaly or masses felt. Normal bowel sounds heard. NG tube + and ongoing tube feeding. Central nervous system: Alert and oriented. No focal neurological deficits. Extremities: Symmetric 5 x 5 power. Skin: No rashes, lesions or ulcers Psychiatry: Judgement and insight appear normal. Mood & affect appropriate.     Data Reviewed: I have personally reviewed following labs and imaging studies  CBC:  Recent Labs Lab 04/12/17 1834  04/14/17 0429 04/15/17 0507 04/16/17 0623 04/16/17 1052 04/17/17 0433 04/18/17 1047  WBC  --   < > 16.2* 16.2* 13.8*  --  16.8* 16.8*  NEUTROABS 10.8*  --   --  12.6* 10.7*  --   --  PENDING  HGB  --   < > 15.0 11.8* 8.7* 9.9* 9.4* 8.9*  HCT  --   < > 46.5* 37.3 27.1* 31.0* 29.5* 27.9*  MCV  --   < > 80.4 81.8 81.4  --  81.3 80.6  PLT  --   < > 248 190 161  --  186 213  < > = values in this interval not displayed. Basic Metabolic Panel:  Recent Labs Lab 04/15/17 0507 04/15/17 1035 04/16/17 0623 04/17/17 0433 04/17/17 0450 04/17/17 1438 04/18/17 1047  NA 141 140 140 141  --   --  140  K 4.4 4.0 3.4* 3.2*  --   --  3.2*  CL 112* 109 107 103  --   --  101  CO2 25 26 25  32  --   --  31  GLUCOSE 152* 169* 116* 135*  --   --  197*  BUN 15 13 8 7   --   --  7  CREATININE 0.82 0.83 0.56 0.62  --   --  0.57  CALCIUM 8.4* 8.5* 7.7* 7.9*  --   --  7.6*  MG  --   --   --   --  1.6* 2.6* 2.0  PHOS  --   --   --   --   --  1.8* 2.2*   Liver Function Tests:  Recent Labs Lab 04/15/17 0507 04/15/17 1035 04/16/17 0623 04/17/17 0433 04/18/17 1047  AST 50* 46* 28 25 35  ALT 145* 139* 78* 69* 56*  ALKPHOS 55 56 42  51 62  BILITOT 2.2* 2.4* 1.5* 1.6* 0.9  PROT 5.5* 5.8* 4.8* 5.6* 5.4*  ALBUMIN 2.6* 2.7* 2.2* 2.5* 2.3*    CBG:  Recent Labs Lab 04/17/17 2222 04/18/17 0032 04/18/17 0314 04/18/17 0802 04/18/17 1121  GLUCAP 108* 108* 119* 120* 171*    Recent Results (from the past 240 hour(s))  MRSA PCR Screening     Status: None   Collection Time: 04/13/17  2:50 AM  Result Value Ref Range Status   MRSA by PCR NEGATIVE NEGATIVE Final    Comment:        The GeneXpert MRSA Assay (FDA approved for NASAL specimens only), is one component of a comprehensive MRSA colonization surveillance program. It is not intended to diagnose MRSA infection nor to guide or monitor treatment for MRSA infections.   Culture, blood (x 2)     Status: None (Preliminary result)   Collection  Time: 04/14/17 11:00 PM  Result Value Ref Range Status   Specimen Description SITE NOT SPECIFIED  Final   Special Requests Blood Culture adequate volume  Final   Culture NO GROWTH 3 DAYS  Final   Report Status PENDING  Incomplete  Culture, blood (x 2)     Status: None (Preliminary result)   Collection Time: 04/14/17 11:06 PM  Result Value Ref Range Status   Specimen Description SITE NOT SPECIFIED  Final   Special Requests Blood Culture adequate volume  Final   Culture NO GROWTH 3 DAYS  Final   Report Status PENDING  Incomplete  Culture, blood (Routine X 2) w Reflex to ID Panel     Status: None (Preliminary result)   Collection Time: 04/17/17 12:29 PM  Result Value Ref Range Status   Specimen Description BLOOD RIGHT HAND  Final   Special Requests Blood Culture adequate volume  Final   Culture NO GROWTH < 12 HOURS  Final   Report Status PENDING  Incomplete  Culture, blood (Routine X 2) w Reflex to ID Panel     Status: None (Preliminary result)   Collection Time: 04/17/17 12:29 PM  Result Value Ref Range Status   Specimen Description BLOOD PICC LINE DRAW  Final   Special Requests   Final    Blood Culture results may  not be optimal due to an inadequate volume of blood received in culture bottles   Culture NO GROWTH < 12 HOURS  Final   Report Status PENDING  Incomplete         Radiology Studies: Dg Chest Port 1 View  Result Date: 04/18/2017 CLINICAL DATA:  Pt lethargic, unrousable for imaging instructions. Fever, chills, acute pancreatitis. EXAM: PORTABLE CHEST 1 VIEW COMPARISON:  04/14/2017 FINDINGS: Lung volumes remain low. There is lung base opacity that is most likely atelectasis. No new lung abnormalities. No pneumothorax. Cardiac silhouette is normal in size. No mediastinal or hilar masses. Enteric feeding tube passes below the diaphragm through the distal stomach and below the included field of view. Right PICC is stable, tip projecting in the right atrium. IMPRESSION: 1. No convincing acute cardiopulmonary disease. 2. Low lung volumes with lung base opacity that is most likely atelectasis. Electronically Signed   By: Amie Portland M.D.   On: 04/18/2017 10:58   Dg Vangie Bicker G Tube Plc W/fl W/rad  Result Date: 04/17/2017 INDICATION: Acute necrotizing pancreatitis EXAM: FLOURO RM 1-60 MIN COMPARISON:  CT abdomen and pelvis 04/17/2017 CONTRAST:  None FLUOROSCOPY TIME:  1 minutes. 10 seconds. COMPLICATIONS: None immediate PROCEDURE: The feeding tube was lubricated with viscous lidocaine inserted into the right nostril. Under intermittent fluoroscopic guidance, the feeding tube was advanced through the stomach, through the duodenum with tipultimately terminating over the expected location of the duodenal - jejunal junction. A spot fluoroscopic image was saved for documentation purposes. The tube was affixed to the patient's nose with tape. The patient tolerated the procedure well without immediate postprocedural complication. FINDINGS: As above IMPRESSION: Successful fluoroscopic guided placement of a feeding tube with tip terminating over the duodenojejunal junction. The tube is ready for immediate use.  Electronically Signed   By: Ulyses Southward M.D.   On: 04/17/2017 17:42   Ct Pancreas Abd W/wo  Result Date: 04/17/2017 CLINICAL DATA:  Pancreatitis. EXAM: CT ABDOMEN WITHOUT AND WITH CONTRAST TECHNIQUE: Multidetector CT imaging of the abdomen was performed following the standard protocol before and following the bolus administration of intravenous contrast. CONTRAST:  100 mL Isovue COMPARISON:  None. FINDINGS: Lower chest: Bilateral pleural effusions and dense basilar atelectasis is new from comparison exam. Hepatobiliary: On arterial phase imaging there is a round well-circumscribed focus of hypoperfusion measuring 3.5 cm (image 48, series 4 )which is isodense to the liver on the venous phase imaging. There is additionally hypoperfusion to the more general posterior RIGHT hepatic lobe. These are felt benign vascular profusion anomalies. No enhancing hepatic lesion. No biliary duct dilatation. Small amount sludge within the gallbladder. Gallbladder is nondistended. Pancreas: Pancreas is edematous measuring 4 cm in diameter compared to 3.4 cm on comparison CT. There is hypoperfusion of a large portion of the body the pancreas measuring 4.0 x 8.8 cm (image 56, series 4). Only the head and tail of the pancreas perfuse normally. The pancreatic duct cannot be followed through this region of non profusion. There is no organized fluid collection. Free fluid extends along the LEFT and LEFT pericolic gutter. The volume of fluid on LEFT and RIGHT is decreased compared to prior. Fluid also extends into the pancreatic gastric hepatic ligament similar to prior. Spleen: There is a filling defect within the splenic vein proximally (Image 45, series 10). The more distal splenic vein is diminished in caliber and difficult to follow. Concern for thrombosis Portal veins are patent. Adrenals/urinary tract: Adrenal glands and kidneys are normal. The ureters and bladder normal. Stomach/Bowel: Stomach is decompressed. Evidence of prior  gastric bypass surgery (sleeve bypass) no small bowel dilatation . Vascular/Lymphatic: The no evidence of complication of the celiac trunk or SMA branches. Portal vein is patent. Splenic vein thrombosis of above Musculoskeletal: No aggressive osseous lesion. IMPRESSION: 1. Majority of the pancreatic body is hypoperfused consistent with pancreatic necrosis. Only the tail and head of the pancreas perfuse normally. 2. Mild-to-moderate increased in pancreatic edema compared to prior. 3. Interval decrease in pleural fluid on the LEFT and RIGHT pericolic gutter. 4. No organized fluid collections. 5. Thrombus within the proximal splenic vein. The distal splenic vein is poorly imaged and may be thrombosed. Portal veins are patent 6. New bilateral pleural effusions and dense basilar atelectasis. These results will be called to the ordering clinician or representative by the Radiologist Assistant, and communication documented in the PACS or zVision Dashboard. Electronically Signed   By: Genevive Bi M.D.   On: 04/17/2017 12:07        Scheduled Meds: . ALPRAZolam  0.25 mg Oral Daily  . chlorhexidine  15 mL Mouth Rinse BID  . Chlorhexidine Gluconate Cloth  6 each Topical Daily  . feeding supplement (VITAL HIGH PROTEIN)  1,000 mL Per Tube Q24H  . heparin  7,500 Units Subcutaneous Q8H  . HYDROmorphone   Intravenous Q4H  . insulin aspart  2-6 Units Subcutaneous Q4H  . labetalol  20 mg Intravenous Q8H  . lipase/protease/amylase  36,000 Units Oral TID BM  . lipase/protease/amylase  72,000 Units Oral TID WC  . mouth rinse  15 mL Mouth Rinse q12n4p  . ondansetron (ZOFRAN) IV  4 mg Intravenous TID WC & HS  . pantoprazole (PROTONIX) IV  40 mg Intravenous Q12H  . sodium chloride flush  10-40 mL Intracatheter Q12H   Continuous Infusions: . lactated ringers 125 mL/hr at 04/18/17 0600     LOS: 5 days     Marley Pakula, MD, FACP, FHM. [facility] Pager [phone]  If 7PM-7AM, please contact  night-coverage [email or web address] Password [email or web address] 04/18/2017, 11:40 AM

## 2017-04-18 NOTE — Progress Notes (Signed)
Pharmacy Antibiotic Note  Kaylee Montgomery is a 36 y.o. female admitted on 04/12/2017 with necrotizing pancreatitis.  She has had several doses of Zosyn since admission with no improvement.  Changing coverage to Meropenem per pharmacy dosing for conservative mgmt of pancreatitis. No hx seizures noted.   04/18/2017:  Tm 102  WBC elevated at 16.8  Renal function stable at baseline; est CrCl>170ml/min  PCT level slightly elevated at 0.91  Plan: Meropenem 1gm IV q8h Monitor renal function, clinical course  Height: 5\' 4"  (162.6 cm) Weight: 237 lb 14 oz (107.9 kg) IBW/kg (Calculated) : 54.7  Temp (24hrs), Avg:101.2 F (38.4 C), Min:100.1 F (37.8 C), Max:102 F (38.9 C)   Recent Labs Lab 04/14/17 0429 04/14/17 0959 04/15/17 0507 04/15/17 0830 04/15/17 1035 04/16/17 0623 04/17/17 0433 04/18/17 1047  WBC 16.2*  --  16.2*  --   --  13.8* 16.8* 16.8*  CREATININE 0.85  --  0.82  --  0.83 0.56 0.62 0.57  LATICACIDVEN  --  3.6*  --  1.5  --   --   --   --     Estimated Creatinine Clearance: 115.5 mL/min (by C-G formula based on SCr of 0.57 mg/dL).    Allergies  Allergen Reactions  . Bromocriptine Mesylate Nausea Only  . Depo-Medrol [Methylprednisolone Acetate] Itching and Rash  . Imitrex [Sumatriptan] Rash    Antimicrobials this admission: Zosyn 6/26 -6/27 restarted 6/29>>6/30 Meropenem 6/30>>  Dose adjustments this admission:  Microbiology results: 6/29 [facility] x2>>ngtd 6/25 MRSA PCR: negative 6/26  BC x 2>> ngtd  Thank you for allowing pharmacy to be a part of this patient's care.  Biagio Borg 04/18/2017 11:52 AM

## 2017-04-18 NOTE — Progress Notes (Signed)
[facility] asked to takeover care of this patient in the AM.  Patient transferred from [place] for continued management of necrotizing pancreatitis.  She was accepted by PCCM because there was an initial concern that she could need intubation.  Patient has been evaluated by PCCM and will not need intubation.  Tachypnea attributed to inadequate pain control.  Patient is now on a dilaudid PCA.  On IV zosyn for necrotizing pancreatitis.  Hospitalist should see in AM.

## 2017-04-19 DIAGNOSIS — K858 Other acute pancreatitis without necrosis or infection: Secondary | ICD-10-CM

## 2017-04-19 LAB — CULTURE, BLOOD (ROUTINE X 2)
Culture: NO GROWTH
Culture: NO GROWTH
Special Requests: ADEQUATE
Special Requests: ADEQUATE

## 2017-04-19 LAB — GLUCOSE, CAPILLARY
Glucose-Capillary: 121 mg/dL — ABNORMAL HIGH (ref 65–99)
Glucose-Capillary: 120 mg/dL — ABNORMAL HIGH (ref 65–99)
Glucose-Capillary: 125 mg/dL — ABNORMAL HIGH (ref 65–99)
Glucose-Capillary: 122 mg/dL — ABNORMAL HIGH (ref 65–99)
Glucose-Capillary: 123 mg/dL — ABNORMAL HIGH (ref 65–99)
Glucose-Capillary: 122 mg/dL — ABNORMAL HIGH (ref 65–99)
Glucose-Capillary: 147 mg/dL — ABNORMAL HIGH (ref 65–99)

## 2017-04-19 LAB — CBC
HCT: 27.1 % — ABNORMAL LOW (ref 36.0–46.0)
Hemoglobin: 8.7 g/dL — ABNORMAL LOW (ref 12.0–15.0)
MCH: 25.9 pg — ABNORMAL LOW (ref 26.0–34.0)
MCHC: 32.1 g/dL (ref 30.0–36.0)
MCV: 80.7 fL (ref 78.0–100.0)
Platelets: 258 10*3/uL (ref 150–400)
RBC: 3.36 MIL/uL — ABNORMAL LOW (ref 3.87–5.11)
RDW: 14.4 % (ref 11.5–15.5)
WBC: 19.9 10*3/uL — ABNORMAL HIGH (ref 4.0–10.5)

## 2017-04-19 LAB — COMPREHENSIVE METABOLIC PANEL
ALT: 54 U/L (ref 14–54)
AST: 38 U/L (ref 15–41)
Albumin: 2.3 g/dL — ABNORMAL LOW (ref 3.5–5.0)
Alkaline Phosphatase: 62 U/L (ref 38–126)
Anion gap: 8 (ref 5–15)
BUN: 6 mg/dL (ref 6–20)
CO2: 30 mmol/L (ref 22–32)
Calcium: 7.9 mg/dL — ABNORMAL LOW (ref 8.9–10.3)
Chloride: 102 mmol/L (ref 101–111)
Creatinine, Ser: 0.53 mg/dL (ref 0.44–1.00)
GFR calc Af Amer: >60 mL/min (ref >60)
GFR calc non Af Amer: >60 mL/min (ref >60)
Glucose, Bld: 131 mg/dL — ABNORMAL HIGH (ref 65–99)
Potassium: 3.5 mmol/L (ref 3.5–5.1)
Sodium: 140 mmol/L (ref 135–145)
Total Bilirubin: 0.5 mg/dL (ref 0.3–1.2)
Total Protein: 5.6 g/dL — ABNORMAL LOW (ref 6.5–8.1)

## 2017-04-19 LAB — PHOSPHORUS: Phosphorus: 2.3 mg/dL — ABNORMAL LOW (ref 2.5–4.6)

## 2017-04-19 LAB — MAGNESIUM: Magnesium: 1.8 mg/dL (ref 1.7–2.4)

## 2017-04-19 LAB — PROCALCITONIN: Procalcitonin: 4.34 ng/mL

## 2017-04-19 MED ORDER — VITAL AF 1.2 CAL PO LIQD
1000.0000 mL | ORAL | Status: DC
  Administered 2017-04-19 – 2017-04-26 (×10): 1000 mL
  Filled 2017-04-19 (×15): qty 1000

## 2017-04-19 MED ORDER — ADULT MULTIVITAMIN LIQUID CH
15.0000 mL | Freq: Every day | ORAL | Status: DC
  Administered 2017-04-19 – 2017-04-23 (×5): 15 mL via ORAL
  Filled 2017-04-19 (×7): qty 15

## 2017-04-19 MED ORDER — LABETALOL HCL 5 MG/ML IV SOLN
5.0000 mg | Freq: Four times a day (QID) | INTRAVENOUS | Status: DC | PRN
Start: 2017-04-19 — End: 2017-04-26
  Administered 2017-04-21 – 2017-04-23 (×5): 5 mg via INTRAVENOUS
  Filled 2017-04-19 (×6): qty 4

## 2017-04-19 MED ORDER — SODIUM CHLORIDE 0.9 % IV SOLN
2.0000 g | Freq: Three times a day (TID) | INTRAVENOUS | Status: DC
  Administered 2017-04-19 – 2017-04-21 (×6): 2 g via INTRAVENOUS
  Filled 2017-04-19 (×8): qty 2

## 2017-04-19 MED ORDER — POTASSIUM PHOSPHATES 15 MMOLE/5ML IV SOLN
20.0000 meq | Freq: Once | INTRAVENOUS | Status: AC
  Administered 2017-04-19: 20 meq via INTRAVENOUS
  Filled 2017-04-19: qty 4.55

## 2017-04-19 MED ORDER — METOPROLOL TARTRATE 25 MG PO TABS
50.0000 mg | ORAL_TABLET | Freq: Two times a day (BID) | ORAL | Status: DC
  Administered 2017-04-19 – 2017-04-20 (×3): 50 mg via ORAL
  Filled 2017-04-19 (×3): qty 2

## 2017-04-19 MED ORDER — POLYETHYLENE GLYCOL 3350 17 G PO PACK
17.0000 g | PACK | Freq: Every day | ORAL | Status: DC
  Administered 2017-04-19: 17 g via ORAL
  Filled 2017-04-19 (×3): qty 1

## 2017-04-19 NOTE — Progress Notes (Signed)
[facility] Gastroenterology Progress Note  Kaylee Montgomery 36 y.o. 1981-09-09  CC:  Acute necrotizing pancreatitis   Subjective: Patient feeling somewhat better. Sitting comfortably in the recliner eating lunch. Abdominal pain is improving. She is passing flatus but has not had any bowel movement since admission. She continues to spike fever. She remains tachycardic.  ROS : Positive for weakness. Positive for fever. Negative for chest pain. Negative for dizziness.   Objective: Vital signs in last 24 hours: Vitals:   04/19/17 1100 04/19/17 1200  BP: (!) 155/97   Pulse:    Resp: (!) 23 20  Temp:  99.6 F (37.6 C)    Physical Exam:  General:  Alert, cooperative, no distress, appears stated age. NG tube in place   Head:  Normocephalic, without obvious abnormality, atraumatic  Eyes:  , EOM's intact,   Lungs:   Occasional rales. Unlabored respiration   Heart:  Tachycardia S1, S2 normal  Abdomen:   Mild distended, epigastric tenderness to palpation, bowel sounds active all four quadrants,  no masses, no peritoneal signs   Extremities: Extremities normal, atraumatic, trace  edema  Pulses: 2+ and symmetric    Lab Results:  Recent Labs  04/18/17 1047 04/18/17 1700 04/19/17 0400  NA 140  --  140  K 3.2*  --  3.5  CL 101  --  102  CO2 31  --  30  GLUCOSE 197*  --  131*  BUN 7  --  6  CREATININE 0.57  --  0.53  CALCIUM 7.6*  --  7.9*  MG 2.0 1.9 1.8  PHOS 2.2* 2.3* 2.3*    Recent Labs  04/18/17 1047 04/19/17 0400  AST 35 38  ALT 56* 54  ALKPHOS 62 62  BILITOT 0.9 0.5  PROT 5.4* 5.6*  ALBUMIN 2.3* 2.3*    Recent Labs  04/18/17 1047 04/19/17 0400  WBC 16.8* 19.9*  NEUTROABS 13.1*  --   HGB 8.9* 8.7*  HCT 27.9* 27.1*  MCV 80.6 80.7  PLT 213 258   No results for input(s): LABPROT, INR in the last 72 hours.    Assessment/Plan: - acute necrotizing pancreatitis. Initially diagnosed with acute pancreatitis on 04/13/2017, repeat CT on 04/17/2017 showed  necrotizing pancreatitis. - Fever with tachycardia. Most likely reactive from underlying necrotizing pancreatitis. - proximal splenic vein thrombosis - abnormal LFTs. Negative hepatitis panel. Normal AST.  Recommendations ------------------------- - Appreciate nutrition input. Advance tube feeding as tolerated. Continue low-fat diet as tolerated. Continue meropenem. - Recommend gentle IV hydration. She appears to have some volume overload. - Recommend repeat CT in 1 week   - No clear etiology for her pancreatitis. Possible gallstone pancreatitis from sludge/microlithiasis.  Denied alcohol use since April. No new medications. Normal triglycerides on admission.no family history of chronic pancreatitis - Continue conservative management for now.  - MiraLAX for constipation - GI will follow    Otis Brace MD, [place] 04/19/2017, 1:03 PM  Pager [phone]  If no answer or after 5 PM call [phone]

## 2017-04-19 NOTE — Progress Notes (Signed)
PULMONARY / CRITICAL CARE MEDICINE   Name: Kaylee Montgomery MRN: [id number] DOB: 06-23-1981    ADMISSION DATE:  04/12/2017 CONSULTATION DATE:  04/17/17  REFERRING MD:  Dr. Sheran Fava / [facility]   CHIEF COMPLAINT:  Pancreatitis   HISTORY OF PRESENT ILLNESS:   36 y/o F who initially presented to [facility] on 04/12/17 with reports of upper abdominal pain, nausea, vomiting, diarrhea and weakness.    She has a medical hx of morbid obesity s/p sleeve gastric surgery, HTN, pituitary tumor, breast lump removal, HTN, OSA on CPAP (not compliant) and very rare alcohol use (last one week prior to admit).  She was found to have a lipase of 6,774, AST 332, ALT 263, WBC 14 and potassium of 2.8.  CT of the abdomen / pelvis was assessed which demonstrated an enlarged edematous pancreas with fluid / edema surrounding the pancreas concerning for acute pancreatitis.  Blood cultures were obtained and negative to date.  She was admitted per [facility] for further care and treated with IVF's and narcotics.  She was started on empiric zosyn (6/26).  She continued to have tachycardia (130's), fever, & leukocytosis.  CXR was concerning for low lung volumes & right pleural effusion.  The patient is requiring 3L/[place].  ABG assessed on 6/29 > 7.457 / 44 / 67 / 30.4.  BiPAP was attempted but the patient did not tolerate.  GI consulted at [facility] > CT showed concern for pancreatic necrosis and splenic vein thrombosis.  She was subsequently transferred to [facility] for further evaluation.     PAST MEDICAL HISTORY :  She  has a past medical history of Breast mass (02/2017); Dental crown present; Hypertension; Migraines; Pituitary adenoma ([place]); Seasonal allergies; and Sleep apnea.  PAST SURGICAL HISTORY: She  has a past surgical history that includes Tonsillectomy (age 63); Laparoscopic gastric sleeve resection (N/A, 02/18/2016); Microlaryngoscopy with laser (04/28/2000; 12/17/2000); Cesarean section; and Breast lumpectomy with radioactive seed  localization (Left, 03/05/2017).  Allergies  Allergen Reactions  . Bromocriptine Mesylate Nausea Only  . Depo-Medrol [Methylprednisolone Acetate] Itching and Rash  . Imitrex [Sumatriptan] Rash    No current facility-administered medications on file prior to encounter.    Current Outpatient Prescriptions on File Prior to Encounter  Medication Sig  . cyclobenzaprine (FLEXERIL) 5 MG tablet Take 1 tablet (5 mg total) by mouth at bedtime.  . gabapentin (NEURONTIN) 300 MG capsule Take 1 capsule (300 mg total) by mouth at bedtime.  . metoprolol succinate (TOPROL XL) 50 MG 24 hr tablet Take 1 tablet (50 mg total) by mouth daily. Take with or immediately following a meal.    FAMILY HISTORY:  Her indicated that her mother is alive. She indicated that her father is alive. She indicated that her brother is alive. She indicated that the status of her maternal grandfather is unknown. She indicated that her son is alive. She indicated that the status of her neg hx is unknown.    SOCIAL HISTORY: She  reports that she has never smoked. She has never used smokeless tobacco. She reports that she drinks alcohol. She reports that she does not use drugs.  REVIEW OF SYSTEMS:  POSITIVES IN BOLD Gen: Denies fever, chills, weight change, fatigue, night sweats HEENT: Denies blurred vision, double vision, hearing loss, tinnitus, sinus congestion, rhinorrhea, sore throat, neck stiffness, dysphagia PULM: Denies shortness of breath, cough, sputum production, hemoptysis, wheezing CV: Denies chest pain, edema, orthopnea, paroxysmal nocturnal dyspnea, palpitations GI: Denies abdominal pain, nausea, vomiting, diarrhea, hematochezia, melena, constipation,  change in bowel habits GU: Denies dysuria, hematuria, polyuria, oliguria, urethral discharge Endocrine: Denies hot or cold intolerance, polyuria, polyphagia or appetite change Derm: Denies rash, dry skin, scaling or peeling skin change Heme: Denies easy bruising,  bleeding, bleeding gums Neuro: Denies headache, numbness, weakness, slurred speech, loss of memory or consciousness   SUBJECTIVE:  Still tachycardic Pain is better today.   VITAL SIGNS: BP (!) 167/114   Pulse (!) 129   Temp (!) 101.2 F (38.4 C) (Oral)   Resp 19   Ht 5\' 4"  (1.626 m)   Wt 240 lb 11.9 oz (109.2 kg)   LMP 04/14/2004 Comment: neg preg test  SpO2 99%   BMI 41.32 kg/m   HEMODYNAMICS: CVP:  [13 mmHg-20 mmHg] 18 mmHg  VENTILATOR SETTINGS:    INTAKE / OUTPUT: I/O last 3 completed shifts: In: 4949.7 [I.V.:3613.8; NG/GT:781.3; IV Piggyback:554.6] Out: 3125 [Urine:3125]  PHYSICAL EXAMINATION:. Gen:      No acute distress HEENT:  EOMI, sclera anicteric Neck:     No masses; no thyromegaly Lungs:    Clear to auscultation bilaterally; normal respiratory effort CV:         Tachycardia; no murmurs Abd:      + bowel sounds; soft, non-tender; no palpable masses, no distension Ext:    No edema; adequate peripheral perfusion Skin:      Warm and dry; no rash Neuro: alert and oriented x 3 Psych: normal mood and affect   LABS:  BMET  Recent Labs Lab 04/17/17 0433 04/18/17 1047 04/19/17 0400  NA 141 140 140  K 3.2* 3.2* 3.5  CL 103 101 102  CO2 32 31 30  BUN 7 7 6   CREATININE 0.62 0.57 0.53  GLUCOSE 135* 197* 131*    Electrolytes  Recent Labs Lab 04/17/17 0433  04/18/17 1047 04/18/17 1700 04/19/17 0400  CALCIUM 7.9*  --  7.6*  --  7.9*  MG  --   < > 2.0 1.9 1.8  PHOS  --   < > 2.2* 2.3* 2.3*  < > = values in this interval not displayed.  CBC  Recent Labs Lab 04/17/17 0433 04/18/17 1047 04/19/17 0400  WBC 16.8* 16.8* 19.9*  HGB 9.4* 8.9* 8.7*  HCT 29.5* 27.9* 27.1*  PLT 186 213 258    Coag's No results for input(s): APTT, INR in the last 168 hours.  Sepsis Markers  Recent Labs Lab 04/14/17 0600 04/14/17 0959 04/15/17 0830 04/18/17 1047 04/19/17 0400  LATICACIDVEN  --  3.6* 1.5  --   --   PROCALCITON 2.40  --   --  0.91 4.34     ABG  Recent Labs Lab 04/17/17 1150  PHART 7.457*  PCO2ART 44.3  PO2ART 67.6*    Liver Enzymes  Recent Labs Lab 04/17/17 0433 04/18/17 1047 04/19/17 0400  AST 25 35 38  ALT 69* 56* 54  ALKPHOS 51 62 62  BILITOT 1.6* 0.9 0.5  ALBUMIN 2.5* 2.3* 2.3*    Cardiac Enzymes No results for input(s): TROPONINI, PROBNP in the last 168 hours.  Glucose  Recent Labs Lab 04/18/17 1634 04/18/17 1936 04/18/17 2343 04/19/17 0309 04/19/17 0355 04/19/17 0749  GLUCAP 123* 126* 118* 121* 120* 125*    Imaging Dg Chest Port 1 View  Result Date: 04/18/2017 CLINICAL DATA:  Pt lethargic, unrousable for imaging instructions. Fever, chills, acute pancreatitis. EXAM: PORTABLE CHEST 1 VIEW COMPARISON:  04/14/2017 FINDINGS: Lung volumes remain low. There is lung base opacity that is most likely atelectasis. No new  lung abnormalities. No pneumothorax. Cardiac silhouette is normal in size. No mediastinal or hilar masses. Enteric feeding tube passes below the diaphragm through the distal stomach and below the included field of view. Right PICC is stable, tip projecting in the right atrium. IMPRESSION: 1. No convincing acute cardiopulmonary disease. 2. Low lung volumes with lung base opacity that is most likely atelectasis. Electronically Signed   By: Lajean Manes M.D.   On: 04/18/2017 10:58     STUDIES:  CT Abd/Pelvis 6/25 > Enlarged edematous pancreas with moderate-to-marked fluid and edema surrounding the pancreas consistent with acute pancreatitis. No focal or organized fluid collections are seen at this time. Small amount of pericholecystic fluid, uncertain if this is related to generalized fluid in the upper abdomen from pancreatitis or if this is due to inflammation of the gallbladder. Suggest correlation with gallbladder ultrasound. Small free fluid in the abdomen and pelvis. Fatty liver Focal hypodensity in the lateral aspect of the left breast, felt related to history of recent breast  excision. [place] ABD 6/25 >  enlarged pancreas consistent with pancreatitis, probable sludge within the gallbladder with upper normal gallbladder wall thickness, no definite gallstones, hypoechoic region within the liver 3.5cm  CT A/P 6/29 > Majority of the pancreatic body is hypoperfused consistent with pancreatic necrosis. Only the tail and head of the pancreas perfuse normally. Mild-to-moderate increased in pancreatic edema compared to prior. Interval decrease in pleural fluid on the LEFT and RIGHT pericolic gutter. No organized fluid collections. Thrombus within the proximal splenic vein. The distal splenic vein is poorly imaged and may be thrombosed. Portal veins are patent New bilateral pleural effusions and dense basilar atelectasis. ECHO 6/29 > EF 65-70%   CULTURES: BCx2 6/26 > negative  BCx2 6/29 >>   ANTIBIOTICS: Zosyn 6/26 >>   SIGNIFICANT EVENTS: 6/24  Admit with abd pain, N/V/D > concern for pancreatitis   LINES/TUBES: PICC Right Brachial 6/26 >>   DISCUSSION: 36 y/o F admitted 6/24 with abdominal pain, N/V/D.  Found to have acute pancreatitis.  Ongoing SIRS physiology > pt transferred to [facility] for further evaluation.   ASSESSMENT / PLAN:  PULMONARY A: Acute Hypoxic Respiratory Failure - suspect multifactorial in the setting of morbid obesity, non-compliance with CPAP, inflammatory response from pancreatitis  OSA - not compliant with CPAP  P:   Supplemental O2 as needed CPAP at night Intermittent CXR IS, mobilize out of bed  CARDIOVASCULAR A:  Sepsis - secondary to pancreatitis  Tachycardia - reactive HTN  P:  ICU montioring IV labetelol to change to PO  RENAL A:   Hypokalemia Hypomagnesemia  P:   Replete lytes as needed Stop IVF Encourage PO intake  GASTROINTESTINAL A:   Necrotizing Acute Pancreatitis -  LFT's elevated on admit (?gallstones/microlithiasis/sludge).  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 Nausea / Vomiting / Diarrhea Hx Sleeve  Gastrectomy P:   Clear diet Post pyloric feeds  HEMATOLOGIC A:   Anemia - without acute bleeding  P:  Trend CBC   INFECTIOUS A:   Acute Pancreatitis  P:   Zosyn changed to mero Follow cultures and Pct  ENDOCRINE A:   At Risk Hyper/Hypoglycemia  P:   SSI coverage  NEUROLOGIC A:   Pain - in setting of abdominal pain  H/O Neuropathy  P:   Continue PCA pump   FAMILY  - Updates: Mother, patient updated at bedside 7/1 - Inter-disciplinary family meet or Palliative Care meeting due by: 04/24/2017    The patient is critically  ill with multiple organ system failure and requires high complexity decision making for assessment and support, frequent evaluation and titration of therapies, advanced monitoring, review of radiographic studies and interpretation of complex data.   Critical Care Time devoted to patient care services, exclusive of separately billable procedures, described in this note is 35 minutes.   Marshell Garfinkel MD [facility] Pager [phone] If no answer or after 3pm call: [phone] 04/19/2017, 9:29 AM

## 2017-04-19 NOTE — Progress Notes (Signed)
Initial Nutrition Assessment  DOCUMENTATION CODES:   Obesity unspecified  INTERVENTION:   -Initiate Vital AF 1.2 @ 30 ml/hr, advance by 10 ml every 6 hours to goal rate of 60 ml/hr.  -This provides 1728 kcal, 108g protein and 1168 ml H2O.  -Will provide liquid MVI daily. -Reviewed low fat and low fiber diet options with patient. -Pt to request Ensure supplements as desired.  RD will continue to monitor  NUTRITION DIAGNOSIS:   Inadequate oral intake related to inability to eat, acute illness, nausea, vomiting, altered GI function as evidenced by meal completion < 50%.  GOAL:   Patient will meet greater than or equal to 90% of their needs  MONITOR:   PO intake, Supplement acceptance, Labs, Weight trends, TF tolerance, I & O's  REASON FOR ASSESSMENT:   Consult Enteral/tube feeding initiation and management  ASSESSMENT:   36 y/o female PMHx Gastric Sleeve (May 2017), HTN, Pituitary Tumor, OSA. Presented to ED with abdominal pain, nausea, vomiting. Worked up for acute pancreatitis w/o pseudocyst. Admitted for management.   Patient in room with family at bedside. Pt previously assessed on 6/29 by RD at [facility](see below). Pt currently receiving trickle feeds of Vital HP @ 20 ml/hr (480 kcal and 42g protein). Pt on a heart healthy diet. RN prior to visit alerted RD that pt and pt's mother had questions regarding what is appropriate on the diet. Pt mainly wanting to know if she can consume juice. She already has been drinking juice at home and here at the hospital and tolerating. Pt is a very picky eater per her report. States she has not been drinking protein shakes at home nor taking her MVI or calcium supplements. Will add liquid MVI to order. Per RN, pt has trouble with taking her CREON capsules.   Nutrition focused physical exam shows no sign of depletion of muscle mass or body fat.  Medications: CREON capsule TID with meals and then TID in between meals, Protonix tablet BID,  Lactated Ringers infusion at 75 ml/hr  Labs reviewed: Low Phos Mg WNL  From assessment on 6/29: -Despite being able to be advanced to a D3 diet, Pt had small bore feeding tube placed today due to worsening pancreatitis on CT scan-> Majority of pancreas is hypoperfused, consistent with necrosis. Also note that pt has likely ARDS w/ acute resp failure.  -As stated, she has had some nutrition since admit, has mostly consumed 10-15% of meals. Had actually consumed 50% of a 1 full liquid meal. Had progressed from CL to D3, but now back to CL.  -Pt had Gastric Sleeve Performed in May 2017. Was ~ 242 lbs at that time. Lost down to 211 lbs last November, but then plataeued at 220. Wt very stable x 6 months.   Diet Order:  Diet Heart Room service appropriate? Yes; Fluid consistency: Thin  Skin:  Reviewed, no issues  Last BM:  6/29-diarrhea  Height:   Ht Readings from Last 1 Encounters:  04/17/17 5\' 4"  (1.626 m)    Weight:   Wt Readings from Last 1 Encounters:  04/19/17 240 lb 11.9 oz (109.2 kg)    Ideal Body Weight:  52.27 kg  BMI:  Body mass index is 41.32 kg/m.  Estimated Nutritional Needs:   Kcal:  1650-1900 (32-36 kcal/kg ibw)  Protein:  90-105g (1.7-2 g/kg IBW)  Fluid:  Per MD  EDUCATION NEEDS:   No education needs identified at this time  Clayton Bibles, MS, RD, LDN Pager: [phone] After Hours Pager:  [phone]  

## 2017-04-19 NOTE — Progress Notes (Signed)
Pharmacy Antibiotic Note  Kaylee Montgomery is a 36 y.o. female admitted on 04/12/2017 with necrotizing pancreatitis.  She has had several doses of Zosyn since admission with no improvement.  Changing coverage to Meropenem per pharmacy dosing for conservative mgmt of pancreatitis. No hx seizures noted.   04/19/2017:  Tm 101.2  WBC elevated 16.8-->19.9  Renal function stable at baseline; est CrCl>157ml/min  PCT increasing 0.91>>4.34  Cx data unrevealing  Plan: Increase Meropenem 2gm IV q8h Monitor renal function, clinical course  Height: 5\' 4"  (162.6 cm) Weight: 240 lb 11.9 oz (109.2 kg) IBW/kg (Calculated) : 54.7  Temp (24hrs), Avg:100.5 F (38.1 C), Min:98.7 F (37.1 C), Max:101.6 F (38.7 C)   Recent Labs Lab 04/14/17 0959 04/15/17 0507 04/15/17 0830 04/15/17 1035 04/16/17 0623 04/17/17 0433 04/18/17 1047 04/19/17 0400  WBC  --  16.2*  --   --  13.8* 16.8* 16.8* 19.9*  CREATININE  --  0.82  --  0.83 0.56 0.62 0.57 0.53  LATICACIDVEN 3.6*  --  1.5  --   --   --   --   --     Estimated Creatinine Clearance: 116.3 mL/min (by C-G formula based on SCr of 0.53 mg/dL).    Allergies  Allergen Reactions  . Bromocriptine Mesylate Nausea Only  . Depo-Medrol [Methylprednisolone Acetate] Itching and Rash  . Imitrex [Sumatriptan] Rash    Antimicrobials this admission: Zosyn 6/26 -6/27 restarted 6/29>>6/30 Meropenem 6/30>>  Dose adjustments this admission: 7/1: Inc 2gm IV q8h  Microbiology results: 6/29 BC x2>>ngtd 6/25 MRSA PCR: negative 6/26  BC x 2>> ngtd  Thank you for allowing pharmacy to be a part of this patient's care.  Biagio Borg 04/19/2017 8:35 AM

## 2017-04-19 NOTE — Progress Notes (Signed)
PROGRESS NOTE   BINH FURIA  GMW:[id number]    DOB: 01-Mar-1981    DOA: 04/12/2017  PCP: Kerri Perches, MD   I have briefly reviewed patients previous medical records in [facility].  Brief Narrative:  36 year old female with PMH of HTN, migraine, pituitary adenoma, OSA noncompliant with CPAP, no history of alcohol abuse, initially admitted to [facility] for complaints of worsening abdominal pain, nausea and vomiting, diagnosed with acute pancreatitis (elevated LFTs, lipase 6000 range, CT and ultrasound abdomen confirming same). Subsequently worsened, ARDS suspected, clinical concern for decompensation/need for intubation, no GI coverage at [facility] on the weekend, hence transferred to [facility] ICU. CCM evaluated and felt patient appropriate under hospitalist care. [facility] consulted. CCM following.   Assessment & Plan:   Principal Problem:   Acute pancreatitis Active Problems:   PITUITARY ADENOMA   Morbid obesity (HCC)   Seasonal and perennial allergic rhinitis   Prediabetes   Obstructive sleep apnea   S/P laparoscopic sleeve gastrectomy   Transaminitis   Anemia   Abdominal pain   1. Acute necrotizing pancreatitis: Unclear etiology but could be from gallstone pancreatitis from sludge/microlithiasis. No history of alcohol abuse and no use since April 2018. Normal triglycerides on admission. No medications to suggest etiology. Denies personal history of gallstones. No family history of pancreatitis. [facility] follow-up appreciated. Starting low-fat diet as tolerated. For now continue postpyloric tube feeding. Changed IV Zosyn to Meropenum for better penetration/coverage. Follow-up CT in a week or if she worsens. Dilaudid PCA for pain management-better controlled.Feels somewhat better. Hesitant to eat due to decreased appetite and does not like hospital food. Encouraged oral intake and mobilization. 2. SIRS: Secondary to problem #1. Continue IV  meropenem. Blood cultures 2 each from 6/26 and 6/29: Negative to date. Chest x-ray 6/30: No acute disease. Incentive spirometry. Mobilize as tolerated. Persistent tachycardia. CVP high, discontinued IV fluids. 3. Proximal splenic vein thrombosis: No anticoagulation recommended. Continue Lovenox for DVT prophylaxis. 4. Abnormal LFTs: Hepatitis panel negative. Per GI. 5. Acute respiratory failure with hypoxia: Related to SIRS, atelectasis, untreated OSA. Incentive spirometry. Supplemental oxygen as needed. CPAP if she is willing to try. Mobilize. 6. Essential hypertension: Uncontrolled. Worsened by acute illness.  Change IV labetalol to by mouth. When necessary IV hydralazine. May need a second agent, i.e. amlodipine. 7. Electrolytes (hypokalemia, hypomagnesemia, hypophosphatemia): Replace and follow as needed. potassium and magnesium better. Replace phosphorus.  8. History of sleeve gastrectomy: 9. Anemia: Secondary to critical illness. Stable. Follow CBCs. 10. Morbid obesity/Body mass index is 41.32 kg/m. 11. Leukocytosis: Secondary to acute pancreatitis.     DVT prophylaxis: Heparin Code Status: Full Family Communication: Discussed in detail with patient's mother at bedside. Disposition: Continue management in stepdown unit for several days.   Consultants:  PCCM [facility] Interventional radiology   Procedures:  Postpyloric NG tube PICC line  Antimicrobials:  Zosyn-discontinued. IV meropenem 6/30 >    Subjective: Still has abdominal pain but seems to be controlled with current Dilaudid PCA. No nausea or vomiting. Reluctant to eat but tolerated some clear liquids. Does not like hospital food. Flatus. No BM. No dyspnea, chest pain or palpitations reported.   ROS: Denies dizziness, lightheadedness, nausea, vomiting or dysuria.  Objective:  Vitals:   04/19/17 0700 04/19/17 0800 04/19/17 1024 04/19/17 1100  BP: (!) 161/109 (!) 180/107 (!) 166/116 (!) 155/97  Pulse:      Resp:  (!) 22 (!) 25 (!) 24 (!) 23  Temp:  Marland Kitchen)  101.2 F (38.4 C)    TempSrc:  Oral    SpO2: 92% 95% 93% (!) 85%  Weight:      Height:        Examination:  General exam: Pleasant young female, moderately built and morbidly obese, lying comfortably propped up in bed.Looks better than she did yesterday. Respiratory system: clear to auscultation. No increased work of breathing. Cardiovascular system: S1 & S2 heard, regular tachycardia. No JVD, murmurs, rubs, gallops or clicks. No pedal edema. Telemetry: Sinus tachycardia in the 120s-130s. No change.  Gastrointestinal system: Abdomen is nondistended, soft. No tenderness today.. No organomegaly or masses felt. Normal bowel sounds heard. NG tube + and ongoing tube feeding. Central nervous system: Alert and oriented. No focal neurological deficits. Extremities: Symmetric 5 x 5 power. Skin: No rashes, lesions or ulcers Psychiatry: Judgement and insight appear normal. Mood & affect appropriate.     Data Reviewed: I have personally reviewed following labs and imaging studies  CBC:  Recent Labs Lab 04/12/17 1834  04/15/17 0507 04/16/17 0623 04/16/17 1052 04/17/17 0433 04/18/17 1047 04/19/17 0400  WBC  --   < > 16.2* 13.8*  --  16.8* 16.8* 19.9*  NEUTROABS 10.8*  --  12.6* 10.7*  --   --  13.1*  --   HGB  --   < > 11.8* 8.7* 9.9* 9.4* 8.9* 8.7*  HCT  --   < > 37.3 27.1* 31.0* 29.5* 27.9* 27.1*  MCV  --   < > 81.8 81.4  --  81.3 80.6 80.7  PLT  --   < > 190 161  --  186 213 258  < > = values in this interval not displayed. Basic Metabolic Panel:  Recent Labs Lab 04/15/17 1035 04/16/17 0623 04/17/17 0433 04/17/17 0450 04/17/17 1438 04/18/17 1047 04/18/17 1700 04/19/17 0400  NA 140 140 141  --   --  140  --  140  K 4.0 3.4* 3.2*  --   --  3.2*  --  3.5  CL 109 107 103  --   --  101  --  102  CO2 26 25 32  --   --  31  --  30  GLUCOSE 169* 116* 135*  --   --  197*  --  131*  BUN 13 8 7   --   --  7  --  6  CREATININE 0.83 0.56 0.62   --   --  0.57  --  0.53  CALCIUM 8.5* 7.7* 7.9*  --   --  7.6*  --  7.9*  MG  --   --   --  1.6* 2.6* 2.0 1.9 1.8  PHOS  --   --   --   --  1.8* 2.2* 2.3* 2.3*   Liver Function Tests:  Recent Labs Lab 04/15/17 1035 04/16/17 0623 04/17/17 0433 04/18/17 1047 04/19/17 0400  AST 46* 28 25 35 38  ALT 139* 78* 69* 56* 54  ALKPHOS 56 42 51 62 62  BILITOT 2.4* 1.5* 1.6* 0.9 0.5  PROT 5.8* 4.8* 5.6* 5.4* 5.6*  ALBUMIN 2.7* 2.2* 2.5* 2.3* 2.3*    CBG:  Recent Labs Lab 04/18/17 1936 04/18/17 2343 04/19/17 0309 04/19/17 0355 04/19/17 0749  GLUCAP 126* 118* 121* 120* 125*    Recent Results (from the past 240 hour(s))  MRSA PCR Screening     Status: None   Collection Time: 04/13/17  2:50 AM  Result Value Ref Range Status   MRSA  by PCR NEGATIVE NEGATIVE Final    Comment:        The GeneXpert MRSA Assay (FDA approved for NASAL specimens only), is one component of a comprehensive MRSA colonization surveillance program. It is not intended to diagnose MRSA infection nor to guide or monitor treatment for MRSA infections.   Culture, blood (x 2)     Status: None   Collection Time: 04/14/17 11:00 PM  Result Value Ref Range Status   Specimen Description SITE NOT SPECIFIED  Final   Special Requests Blood Culture adequate volume  Final   Culture NO GROWTH 5 DAYS  Final   Report Status 04/19/2017 FINAL  Final  Culture, blood (x 2)     Status: None   Collection Time: 04/14/17 11:06 PM  Result Value Ref Range Status   Specimen Description SITE NOT SPECIFIED  Final   Special Requests Blood Culture adequate volume  Final   Culture NO GROWTH 5 DAYS  Final   Report Status 04/19/2017 FINAL  Final  Culture, blood (Routine X 2) w Reflex to ID Panel     Status: None (Preliminary result)   Collection Time: 04/17/17 12:29 PM  Result Value Ref Range Status   Specimen Description BLOOD RIGHT HAND  Final   Special Requests Blood Culture adequate volume  Final   Culture NO GROWTH 2 DAYS   Final   Report Status PENDING  Incomplete  Culture, blood (Routine X 2) w Reflex to ID Panel     Status: None (Preliminary result)   Collection Time: 04/17/17 12:29 PM  Result Value Ref Range Status   Specimen Description BLOOD PICC LINE DRAW  Final   Special Requests   Final    Blood Culture results may not be optimal due to an inadequate volume of blood received in culture bottles   Culture NO GROWTH 2 DAYS  Final   Report Status PENDING  Incomplete         Radiology Studies: Dg Chest Port 1 View  Result Date: 04/18/2017 CLINICAL DATA:  Pt lethargic, unrousable for imaging instructions. Fever, chills, acute pancreatitis. EXAM: PORTABLE CHEST 1 VIEW COMPARISON:  04/14/2017 FINDINGS: Lung volumes remain low. There is lung base opacity that is most likely atelectasis. No new lung abnormalities. No pneumothorax. Cardiac silhouette is normal in size. No mediastinal or hilar masses. Enteric feeding tube passes below the diaphragm through the distal stomach and below the included field of view. Right PICC is stable, tip projecting in the right atrium. IMPRESSION: 1. No convincing acute cardiopulmonary disease. 2. Low lung volumes with lung base opacity that is most likely atelectasis. Electronically Signed   By: Amie Portland M.D.   On: 04/18/2017 10:58   Dg Vangie Bicker G Tube Plc W/fl W/rad  Result Date: 04/17/2017 INDICATION: Acute necrotizing pancreatitis EXAM: FLOURO RM 1-60 MIN COMPARISON:  CT abdomen and pelvis 04/17/2017 CONTRAST:  None FLUOROSCOPY TIME:  1 minutes. 10 seconds. COMPLICATIONS: None immediate PROCEDURE: The feeding tube was lubricated with viscous lidocaine inserted into the right nostril. Under intermittent fluoroscopic guidance, the feeding tube was advanced through the stomach, through the duodenum with tipultimately terminating over the expected location of the duodenal - jejunal junction. A spot fluoroscopic image was saved for documentation purposes. The tube was affixed to the  patient's nose with tape. The patient tolerated the procedure well without immediate postprocedural complication. FINDINGS: As above IMPRESSION: Successful fluoroscopic guided placement of a feeding tube with tip terminating over the duodenojejunal junction. The tube is ready for  immediate use. Electronically Signed   By: Ulyses Southward M.D.   On: 04/17/2017 17:42        Scheduled Meds: . ALPRAZolam  0.25 mg Oral Daily  . chlorhexidine  15 mL Mouth Rinse BID  . Chlorhexidine Gluconate Cloth  6 each Topical Daily  . heparin  5,000 Units Subcutaneous Q8H  . HYDROmorphone   Intravenous Q4H  . insulin aspart  2-6 Units Subcutaneous Q4H  . labetalol  20 mg Intravenous Q8H  . lipase/protease/amylase  36,000 Units Oral TID BM  . lipase/protease/amylase  72,000 Units Oral TID WC  . mouth rinse  15 mL Mouth Rinse q12n4p  . pantoprazole (PROTONIX) IV  40 mg Intravenous Q12H  . sodium chloride flush  10-40 mL Intracatheter Q12H   Continuous Infusions: . feeding supplement (VITAL AF 1.2 CAL)    . lactated ringers 75 mL/hr at 04/19/17 0628  . meropenem (MERREM) IV       LOS: 6 days     Kwamaine Cuppett, MD, FACP, FHM. [facility] Pager [phone]  If 7PM-7AM, please contact night-coverage [email or web address] Password [email or web address] 04/19/2017, 11:53 AM

## 2017-04-20 ENCOUNTER — Telehealth: Payer: Self-pay

## 2017-04-20 DIAGNOSIS — R6510 Systemic inflammatory response syndrome (SIRS) of non-infectious origin without acute organ dysfunction: Secondary | ICD-10-CM

## 2017-04-20 DIAGNOSIS — K8501 Idiopathic acute pancreatitis with uninfected necrosis: Secondary | ICD-10-CM

## 2017-04-20 DIAGNOSIS — R6511 Systemic inflammatory response syndrome (SIRS) of non-infectious origin with acute organ dysfunction: Secondary | ICD-10-CM

## 2017-04-20 LAB — COMPREHENSIVE METABOLIC PANEL
ALT: 54 U/L (ref 14–54)
AST: 40 U/L (ref 15–41)
Albumin: 2.3 g/dL — ABNORMAL LOW (ref 3.5–5.0)
Alkaline Phosphatase: 79 U/L (ref 38–126)
Anion gap: 7 (ref 5–15)
BUN: 8 mg/dL (ref 6–20)
CO2: 29 mmol/L (ref 22–32)
Calcium: 8 mg/dL — ABNORMAL LOW (ref 8.9–10.3)
Chloride: 103 mmol/L (ref 101–111)
Creatinine, Ser: 0.54 mg/dL (ref 0.44–1.00)
GFR calc Af Amer: >60 mL/min (ref >60)
GFR calc non Af Amer: >60 mL/min (ref >60)
Glucose, Bld: 137 mg/dL — ABNORMAL HIGH (ref 65–99)
Potassium: 3.3 mmol/L — ABNORMAL LOW (ref 3.5–5.1)
Sodium: 139 mmol/L (ref 135–145)
Total Bilirubin: 0.6 mg/dL (ref 0.3–1.2)
Total Protein: 6 g/dL — ABNORMAL LOW (ref 6.5–8.1)

## 2017-04-20 LAB — GLUCOSE, CAPILLARY
Glucose-Capillary: 137 mg/dL — ABNORMAL HIGH (ref 65–99)
Glucose-Capillary: 160 mg/dL — ABNORMAL HIGH (ref 65–99)
Glucose-Capillary: 144 mg/dL — ABNORMAL HIGH (ref 65–99)
Glucose-Capillary: 131 mg/dL — ABNORMAL HIGH (ref 65–99)
Glucose-Capillary: 160 mg/dL — ABNORMAL HIGH (ref 65–99)
Glucose-Capillary: 149 mg/dL — ABNORMAL HIGH (ref 65–99)

## 2017-04-20 LAB — CBC
HCT: 26.4 % — ABNORMAL LOW (ref 36.0–46.0)
Hemoglobin: 8.6 g/dL — ABNORMAL LOW (ref 12.0–15.0)
MCH: 26.1 pg (ref 26.0–34.0)
MCHC: 32.6 g/dL (ref 30.0–36.0)
MCV: 80 fL (ref 78.0–100.0)
Platelets: 360 10*3/uL (ref 150–400)
RBC: 3.3 MIL/uL — ABNORMAL LOW (ref 3.87–5.11)
RDW: 14.4 % (ref 11.5–15.5)
WBC: 23.9 10*3/uL — ABNORMAL HIGH (ref 4.0–10.5)

## 2017-04-20 LAB — MAGNESIUM: Magnesium: 1.8 mg/dL (ref 1.7–2.4)

## 2017-04-20 LAB — PROCALCITONIN: Procalcitonin: 0.7 ng/mL

## 2017-04-20 LAB — PHOSPHORUS: Phosphorus: 1.8 mg/dL — ABNORMAL LOW (ref 2.5–4.6)

## 2017-04-20 MED ORDER — POTASSIUM CHLORIDE CRYS ER 20 MEQ PO TBCR
40.0000 meq | EXTENDED_RELEASE_TABLET | ORAL | Status: DC
  Administered 2017-04-20: 40 meq via ORAL
  Filled 2017-04-20: qty 2

## 2017-04-20 MED ORDER — POTASSIUM PHOSPHATES 15 MMOLE/5ML IV SOLN
20.0000 mmol | Freq: Once | INTRAVENOUS | Status: AC
  Administered 2017-04-20: 20 mmol via INTRAVENOUS
  Filled 2017-04-20: qty 6.67

## 2017-04-20 MED ORDER — METOPROLOL TARTRATE 50 MG PO TABS
75.0000 mg | ORAL_TABLET | Freq: Two times a day (BID) | ORAL | Status: DC
  Administered 2017-04-20 – 2017-04-29 (×17): 75 mg via ORAL
  Filled 2017-04-20: qty 1
  Filled 2017-04-20: qty 3
  Filled 2017-04-20 (×6): qty 1
  Filled 2017-04-20 (×2): qty 3
  Filled 2017-04-20: qty 1
  Filled 2017-04-20: qty 3
  Filled 2017-04-20: qty 1
  Filled 2017-04-20 (×4): qty 3

## 2017-04-20 NOTE — Progress Notes (Signed)
[facility] Progress Note  Subjective: The patient is seen today in regards to her acute necrotizing pancreatitis. She is feeling better. Less abdominal discomfort. She has a feeding tube in, and also is taking in some diet. Denies vomiting.  Objective: Vital signs in last 24 hours: Temp:  [98.9 F (37.2 C)-102.5 F (39.2 C)] 98.9 F (37.2 C) (07/02 0400) Pulse Rate:  [125-139] 125 (07/02 0000) Resp:  [6-45] 29 (07/02 0900) BP: (135-181)/(79-113) 166/105 (07/02 0900) SpO2:  [30 %-100 %] 90 % (07/02 0900) Weight:  [109.4 kg (241 lb 2.9 oz)] 109.4 kg (241 lb 2.9 oz) (07/02 0400) Weight change: 0.2 kg (7.1 oz)   PE:  No acute distress  Heart regular rhythm  Lungs clear  Abdomen: Bowel sounds diminished, soft, slight epigastric discomfort  Lab Results: Results for orders placed or performed during the hospital encounter of 04/12/17 (from the past 24 hour(s))  Glucose, capillary     Status: Abnormal   Collection Time: 04/19/17 11:35 AM  Result Value Ref Range   Glucose-Capillary 122 (H) 65 - 99 mg/dL  Glucose, capillary     Status: Abnormal   Collection Time: 04/19/17  3:26 PM  Result Value Ref Range   Glucose-Capillary 123 (H) 65 - 99 mg/dL  Glucose, capillary     Status: Abnormal   Collection Time: 04/19/17  7:46 PM  Result Value Ref Range   Glucose-Capillary 122 (H) 65 - 99 mg/dL   Comment 1 Notify RN   Glucose, capillary     Status: Abnormal   Collection Time: 04/19/17 11:13 PM  Result Value Ref Range   Glucose-Capillary 147 (H) 65 - 99 mg/dL   Comment 1 Notify RN   Comprehensive metabolic panel     Status: Abnormal   Collection Time: 04/20/17  3:48 AM  Result Value Ref Range   Sodium 139 135 - 145 mmol/L   Potassium 3.3 (L) 3.5 - 5.1 mmol/L   Chloride 103 101 - 111 mmol/L   CO2 29 22 - 32 mmol/L   Glucose, Bld 137 (H) 65 - 99 mg/dL   BUN 8 6 - 20 mg/dL   Creatinine, Ser 0.54 0.44 - 1.00 mg/dL   Calcium 8.0 (L) 8.9 - 10.3 mg/dL   Total Protein 6.0  (L) 6.5 - 8.1 g/dL   Albumin 2.3 (L) 3.5 - 5.0 g/dL   AST 40 15 - 41 U/L   ALT 54 14 - 54 U/L   Alkaline Phosphatase 79 38 - 126 U/L   Total Bilirubin 0.6 0.3 - 1.2 mg/dL   GFR calc non Af Amer >60 >60 mL/min   GFR calc Af Amer >60 >60 mL/min   Anion gap 7 5 - 15  CBC     Status: Abnormal   Collection Time: 04/20/17  3:48 AM  Result Value Ref Range   WBC 23.9 (H) 4.0 - 10.5 K/uL   RBC 3.30 (L) 3.87 - 5.11 MIL/uL   Hemoglobin 8.6 (L) 12.0 - 15.0 g/dL   HCT 26.4 (L) 36.0 - 46.0 %   MCV 80.0 78.0 - 100.0 fL   MCH 26.1 26.0 - 34.0 pg   MCHC 32.6 30.0 - 36.0 g/dL   RDW 14.4 11.5 - 15.5 %   Platelets 360 150 - 400 K/uL  Procalcitonin     Status: None   Collection Time: 04/20/17  3:48 AM  Result Value Ref Range   Procalcitonin 0.70 ng/mL  Phosphorus     Status: Abnormal   Collection Time: 04/20/17  3:48 AM  Result Value Ref Range   Phosphorus 1.8 (L) 2.5 - 4.6 mg/dL  Magnesium     Status: None   Collection Time: 04/20/17  3:48 AM  Result Value Ref Range   Magnesium 1.8 1.7 - 2.4 mg/dL  Glucose, capillary     Status: Abnormal   Collection Time: 04/20/17  3:57 AM  Result Value Ref Range   Glucose-Capillary 137 (H) 65 - 99 mg/dL   Comment 1 Notify RN    Comment 2 Document in Chart   Glucose, capillary     Status: Abnormal   Collection Time: 04/20/17  8:28 AM  Result Value Ref Range   Glucose-Capillary 160 (H) 65 - 99 mg/dL   Comment 1 Notify RN    Comment 2 Document in Chart     Studies/Results: No results found.    Assessment: Acute necrotizing pancreatitis  Plan:   Continue current management.    SAM F Ramesha Poster 04/20/2017, 10:40 AM  Pager: [phone] If no answer or after 5 PM call [phone]

## 2017-04-20 NOTE — Progress Notes (Signed)
[place] for PCA Indication: pain control, pancreatitis  Allergies  Allergen Reactions  . Bromocriptine Mesylate Nausea Only  . Depo-Medrol [Methylprednisolone Acetate] Itching and Rash  . Imitrex [Sumatriptan] Rash   Patient Measurements: Height: 5\' 4"  (162.6 cm) Weight: 241 lb 2.9 oz (109.4 kg) IBW/kg (Calculated) : 54.7  Vital Signs: Temp: 98.9 F (37.2 C) (07/02 0400) Temp Source: Oral (07/02 0400) BP: 154/95 (07/02 1000) Pulse Rate: 125 (07/02 0000) Intake/Output from previous day: 07/01 0701 - 07/02 0700 In: 3819.4 [I.V.:1.8; BJ/SE:8315; IV Piggyback:554.6] Out: 1761 [Urine:3040]  Labs:  Recent Labs  04/18/17 1047 04/18/17 1700 04/19/17 0400 04/20/17 0348  WBC 16.8*  --  19.9* 23.9*  HGB 8.9*  --  8.7* 8.6*  HCT 27.9*  --  27.1* 26.4*  PLT 213  --  258 360  CREATININE 0.57  --  0.53 0.54  MG 2.0 1.9 1.8 1.8  PHOS 2.2* 2.3* 2.3* 1.8*  ALBUMIN 2.3*  --  2.3* 2.3*  PROT 5.4*  --  5.6* 6.0*  AST 35  --  38 40  ALT 56*  --  54 54  ALKPHOS 62  --  62 79  BILITOT 0.9  --  0.5 0.6   Estimated Creatinine Clearance: 116.4 mL/min (by C-G formula based on SCr of 0.54 mg/dL).  Medications:  Scheduled:  . ALPRAZolam  0.25 mg Oral Daily  . chlorhexidine  15 mL Mouth Rinse BID  . Chlorhexidine Gluconate Cloth  6 each Topical Daily  . heparin  5,000 Units Subcutaneous Q8H  . HYDROmorphone   Intravenous Q4H  . insulin aspart  2-6 Units Subcutaneous Q4H  . lipase/protease/amylase  36,000 Units Oral TID BM  . lipase/protease/amylase  72,000 Units Oral TID WC  . mouth rinse  15 mL Mouth Rinse q12n4p  . metoprolol tartrate  50 mg Oral BID  . multivitamin  15 mL Oral Daily  . pantoprazole (PROTONIX) IV  40 mg Intravenous Q12H  . polyethylene glycol  17 g Oral Daily  . sodium chloride flush  10-40 mL Intracatheter Q12H   Assessment: 36yo female with pancreatitis and pain difficult to control.  Full dose dilaudid PCA started  6/27. She did not rest well. Her abdominal pain is somewhat improved.  Her mother stated she was unable to go back to sleep. Pain is now a 6 per patient.  She had some drowsiness, which the xanax maybe contributing. The xanax dose was changed to daily today. The PCA dose remains the same, no basal dose. Will adjust lockout time. Patient had a BM.  PCA Doses used: 6/28 5.1 mg 6/29 6.3 mg 6/30: 25 mg 7/1: 14.4 mg  Today, 04/20/2017: Pain scores 4-9 RASS 0 EtCO2 monitoring  8.1 mg used so far today.   LBM reported on 7/2.  Miralax daily started 7/1, Senna-Doc BID PRN but no doses given.  Goal of Therapy:  Improved pain control  Plan:  Continue same full dose PCA with lockout interval changed to custom 12 minutes. Max dose per hour set at 1.25mg . Consider decreasing to reduced dose PCA if opiate requirements are decreasing or if drowsiness increases. Monitor labs, pain control, vitals and respirations  MD: consider resuming oral gabapentin and starting oral pain medications if able to tolerate PO meds.  RN reports she is currently taking PO meds well.    Gretta Arab PharmD, BCPS Pager [phone] 04/20/2017 11:25 AM

## 2017-04-20 NOTE — Progress Notes (Signed)
MEDICATION RELATED CONSULT NOTE - INITIAL   Pharmacy Consult for Phosphorus repletion  Allergies  Allergen Reactions  . Bromocriptine Mesylate Nausea Only  . Depo-Medrol [Methylprednisolone Acetate] Itching and Rash  . Imitrex [Sumatriptan] Rash    Patient Measurements: Height: 5\' 4"  (162.6 cm) Weight: 241 lb 2.9 oz (109.4 kg) IBW/kg (Calculated) : 54.7  Vital Signs: Temp: 98.9 F (37.2 C) (07/02 0400) Temp Source: Oral (07/02 0400) BP: 181/112 (07/02 0600) Pulse Rate: 125 (07/02 0000)  Labs:  Recent Labs  04/18/17 1047 04/18/17 1700 04/19/17 0400 04/20/17 0348  WBC 16.8*  --  19.9* 23.9*  HGB 8.9*  --  8.7* 8.6*  HCT 27.9*  --  27.1* 26.4*  PLT 213  --  258 360  CREATININE 0.57  --  0.53 0.54  MG 2.0 1.9 1.8 1.8  PHOS 2.2* 2.3* 2.3* 1.8*  ALBUMIN 2.3*  --  2.3* 2.3*  PROT 5.4*  --  5.6* 6.0*  AST 35  --  38 40  ALT 56*  --  54 54  ALKPHOS 62  --  62 79  BILITOT 0.9  --  0.5 0.6   Estimated Creatinine Clearance: 116.4 mL/min (by C-G formula based on SCr of 0.54 mg/dL).  Medical History: Past Medical History:  Diagnosis Date  . Breast mass 02/2017  . Dental crown present   . Hypertension    states BP fluctuates; has been on med. x 1 yr.  . Migraines   . Pituitary adenoma ([place])   . Seasonal allergies   . Sleep apnea    no CPAP use in > 6 mos.    Medications:  Scheduled:  . ALPRAZolam  0.25 mg Oral Daily  . chlorhexidine  15 mL Mouth Rinse BID  . Chlorhexidine Gluconate Cloth  6 each Topical Daily  . heparin  5,000 Units Subcutaneous Q8H  . HYDROmorphone   Intravenous Q4H  . insulin aspart  2-6 Units Subcutaneous Q4H  . lipase/protease/amylase  36,000 Units Oral TID BM  . lipase/protease/amylase  72,000 Units Oral TID WC  . mouth rinse  15 mL Mouth Rinse q12n4p  . metoprolol tartrate  50 mg Oral BID  . multivitamin  15 mL Oral Daily  . pantoprazole (PROTONIX) IV  40 mg Intravenous Q12H  . polyethylene glycol  17 g Oral Daily  . potassium  chloride  40 mEq Oral Q4H  . sodium chloride flush  10-40 mL Intracatheter Q12H   Infusions:  . feeding supplement (VITAL AF 1.2 CAL) 1,000 mL (04/20/17 0600)  . meropenem (MERREM) IV Stopped (04/20/17 2094)    Assessment: 43 yoF presented on 6/24 with necrotizing pancreatitis.   Diet: Heart healthy, Vital AF tube feeds. Phos 1.8 (low and decreased despite replacement on 7/1) Mag 1.8 Na 139 K  3.3 SCr 0.54  Previous elyte replacement: KCl x6 runs on 6/29, Kphos 20 mEq on 7/1  Goal of Therapy:  Phos 2.5 - 4.6  Plan:   K Phos 20 mmol IV x1 dose  Also provides ~ 30 mEq of K+.  One dose of KCl 40 mEq PO already given today, after discussion with Dr. Algis Liming, will d/c the second KCl PO dose.  Recheck Phos with AM labs and continue to replete as needed.  Gretta Arab PharmD, BCPS Pager [phone] 04/20/2017 7:29 AM

## 2017-04-20 NOTE — Progress Notes (Signed)
PULMONARY / CRITICAL CARE MEDICINE   Name: Kaylee Montgomery MRN: [id number] DOB: [birth year]    ADMISSION DATE:  04/12/2017 CONSULTATION DATE:  04/17/17  REFERRING MD:  Dr. Sheran Fava / [facility]   CHIEF COMPLAINT:  Pancreatitis   HISTORY OF PRESENT ILLNESS:   36 y/o F who initially presented to [facility] on 04/12/17 with reports of upper abdominal pain, nausea, vomiting, diarrhea and weakness.    She has a medical hx of morbid obesity s/p sleeve gastric surgery, HTN, pituitary tumor, breast lump removal, HTN, OSA on CPAP (not compliant) and very rare alcohol use (last one week prior to admit).  She was found to have a lipase of 6,774, AST 332, ALT 263, WBC 14 and potassium of 2.8.  CT of the abdomen / pelvis >>an enlarged edematous pancreas with fluid / edema surrounding the pancreas concerning for acute pancreatitis.  Blood cultures were obtained and negative to date.  She was admitted per [facility] for further care and treated with IVF's and narcotics.  She was started on empiric zosyn (6/26).  She continued to have tachycardia (130's), fever, & leukocytosis.  CXR was concerning for low lung volumes & right pleural effusion.On 3L/[place].  ABG assessed on 6/29 > 7.457 / 44 / 67 / 30.4.   GI consulted at [facility] > CT showed concern for pancreatic necrosis and splenic vein thrombosis.  She was subsequently transferred to [facility] for further evaluation.      SUBJECTIVE:  Remains tachycardic, febrile Needing frequent use of dilaudid PCA  VITAL SIGNS: BP (!) 181/112   Pulse (!) 125   Temp 98.9 F (37.2 C) (Oral)   Resp (!) 27   Ht 5\' 4"  (1.626 m)   Wt 241 lb 2.9 oz (109.4 kg)   LMP 04/14/2004 Comment: neg preg test  SpO2 (!) 30%   BMI 41.40 kg/m   HEMODYNAMICS: CVP:  [4 mmHg-15 mmHg] 8 mmHg  VENTILATOR SETTINGS:    INTAKE / OUTPUT: I/O last 3 completed shifts: In: 4964.4 [I.V.:826.8; TM/LY:6503; IV Piggyback:654.6] Out: 4440 [Urine:4440]  PHYSICAL EXAMINATION:. Gen:      No acute  distress HEENT:  EOMI, sclera anicteric Neck:     No masses; no thyromegaly Lungs:    Clear to auscultation bilaterally; normal respiratory effort CV:         Tachycardia; no murmurs Abd:      + bowel sounds; soft, non-tender; no palpable masses, no distension Ext:    No edema; adequate peripheral perfusion Skin:      Warm and dry; no rash Neuro: alert and oriented x 3 Psych: normal mood and affect   LABS:  BMET  Recent Labs Lab 04/18/17 1047 04/19/17 0400 04/20/17 0348  NA 140 140 139  K 3.2* 3.5 3.3*  CL 101 102 103  CO2 31 30 29   BUN 7 6 8   CREATININE 0.57 0.53 0.54  GLUCOSE 197* 131* 137*    Electrolytes  Recent Labs Lab 04/18/17 1047 04/18/17 1700 04/19/17 0400 04/20/17 0348  CALCIUM 7.6*  --  7.9* 8.0*  MG 2.0 1.9 1.8 1.8  PHOS 2.2* 2.3* 2.3* 1.8*    CBC  Recent Labs Lab 04/18/17 1047 04/19/17 0400 04/20/17 0348  WBC 16.8* 19.9* 23.9*  HGB 8.9* 8.7* 8.6*  HCT 27.9* 27.1* 26.4*  PLT 213 258 360    Coag's No results for input(s): APTT, INR in the last 168 hours.  Sepsis Markers  Recent Labs Lab 04/14/17 0959 04/15/17 0830 04/18/17 1047 04/19/17 0400 04/20/17  0348  LATICACIDVEN 3.6* 1.5  --   --   --   PROCALCITON  --   --  0.91 4.34 0.70    ABG  Recent Labs Lab 04/17/17 1150  PHART 7.457*  PCO2ART 44.3  PO2ART 67.6*    Liver Enzymes  Recent Labs Lab 04/18/17 1047 04/19/17 0400 04/20/17 0348  AST 35 38 40  ALT 56* 54 54  ALKPHOS 62 62 79  BILITOT 0.9 0.5 0.6  ALBUMIN 2.3* 2.3* 2.3*    Cardiac Enzymes No results for input(s): TROPONINI, PROBNP in the last 168 hours.  Glucose  Recent Labs Lab 04/19/17 1135 04/19/17 1526 04/19/17 1946 04/19/17 2313 04/20/17 0357 04/20/17 0828  GLUCAP 122* 123* 122* 147* 137* 160*    Imaging No results found.   STUDIES:  CT Abd/Pelvis 6/25 > Enlarged edematous pancreas with moderate-to-marked fluid and edema surrounding the pancreas consistent with acute pancreatitis.  No focal or organized fluid collections are seen at this time. Small amount of pericholecystic fluid, uncertain if this is related to generalized fluid in the upper abdomen from pancreatitis or if this is due to inflammation of the gallbladder. Suggest correlation with gallbladder ultrasound. Small free fluid in the abdomen and pelvis. Fatty liver Focal hypodensity in the lateral aspect of the left breast, felt related to history of recent breast excision. [place] ABD 6/25 >  enlarged pancreas consistent with pancreatitis, probable sludge within the gallbladder with upper normal gallbladder wall thickness, no definite gallstones, hypoechoic region within the liver 3.5cm  CT A/P 6/29 > Majority of the pancreatic body is hypoperfused consistent with pancreatic necrosis. Only the tail and head of the pancreas perfuse normally. Mild-to-moderate increased in pancreatic edema compared to prior. Interval decrease in pleural fluid on the LEFT and RIGHT pericolic gutter. No organized fluid collections. Thrombus within the proximal splenic vein. The distal splenic vein is poorly imaged and may be thrombosed. Portal veins are patent New bilateral pleural effusions and dense basilar atelectasis. ECHO 6/29 > EF 65-70%   CULTURES: BCx2 6/26 > negative  BCx2 6/29 >> ng  ANTIBIOTICS: Zosyn 6/26 >>   SIGNIFICANT EVENTS: 6/24  Admit with abd pain, N/V/D > concern for pancreatitis   LINES/TUBES: PICC Right Brachial 6/26 >>   DISCUSSION: 36 y/o F admitted 6/24 with abdominal pain, N/V/D.  Found to have acute pancreatitis.  Ongoing SIRS physiology > pt transferred to [facility] for further evaluation.   ASSESSMENT / PLAN:  PULMONARY A: Acute Hypoxic Respiratory Failure - suspect multifactorial in the setting of morbid obesity, non-compliance with CPAP, inflammatory response from pancreatitis  OSA - not compliant with CPAP  P:   Supplemental O2 as needed CPAP qhs IS, mobilize out of bed  CARDIOVASCULAR A:  SIRS -  secondary to pancreatitis  Tachycardia - reactive HTN  P:  IV labetelol to change to PO  RENAL A:   Hypokalemia / hypophos Hypomagnesemia  P:   Replete lytes as needed    GASTROINTESTINAL A:   Necrotizing Acute Pancreatitis -  LFT's elevated on admit (?gallstones/microlithiasis/sludge).  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 Shade Flood. [place] [facility] Pager [phone] If no response call [phone]   04/20/2017, 9:23 AM

## 2017-04-20 NOTE — Progress Notes (Addendum)
PROGRESS NOTE   Kaylee LUVIANO  OZH:[id number]    DOB: 27-Feb-1981    DOA: 04/12/2017  PCP: Kerri Perches, MD   I have briefly reviewed patients previous medical records in [facility].  Brief Narrative:  36 year old female with PMH of HTN, migraine, pituitary adenoma, OSA noncompliant with CPAP, no history of alcohol abuse, initially admitted to [facility] for complaints of worsening abdominal pain, nausea and vomiting, diagnosed with acute pancreatitis (elevated LFTs, lipase 6000 range, CT and ultrasound abdomen confirming same). Subsequently worsened, ARDS suspected, clinical concern for decompensation/need for intubation, no GI coverage at [facility] on the weekend, hence transferred to [facility] ICU. CCM evaluated and felt patient appropriate under hospitalist care. [facility] consulted. CCM signed off. Slowly improving but expect a slow course. Remains in stepdown unit.   Assessment & Plan:   Principal Problem:   Acute pancreatitis Active Problems:   PITUITARY ADENOMA   Morbid obesity (HCC)   Seasonal and perennial allergic rhinitis   Prediabetes   Obstructive sleep apnea   S/P laparoscopic sleeve gastrectomy   Transaminitis   Anemia   Abdominal pain   1. Acute necrotizing pancreatitis: Unclear etiology but could be from gallstone pancreatitis from sludge/microlithiasis. No history of alcohol abuse and no use since April 2018. Normal triglycerides on admission. No medications to suggest etiology. Denies personal history of gallstones. No family history of pancreatitis. [facility] follow-up appreciated. Low-fat diet as tolerated, eating small amounts, encouraged to increase intake as tolerated. For now continue postpyloric tube feeding. DC tube feeds when able to eat better. Continue IV meropenem. Follow-up CT in a week or if she worsens. Dilaudid PCA for pain management-better controlled. Slowly improving. Continue current management. 2. SIRS  without infectious etiology: Secondary to problem #1. Continues with intermittent high fevers, persistent sinus tachycardia and intermittent elevated blood pressures. Increasing leukocytosis. Continue IV meropenem. Blood cultures 2 each from 6/26 and 6/29: Negative to date. Chest x-ray 6/30: No acute disease. Incentive spirometry. Mobilize as tolerated >sat up in chair for approximately 8 hours on 7/1. CVP high, discontinued IV fluids. Despite SIRS physiology, looks better and seems to be doing better. However if persists, may need to consider repeat CT abdomen. 3. Proximal splenic vein thrombosis: No anticoagulation recommended. Continue Lovenox for DVT prophylaxis. 4. Abnormal LFTs: Hepatitis panel negative. Per GI. Resolved. 5. Acute respiratory failure with hypoxia: Related to SIRS, atelectasis, untreated OSA. Incentive spirometry. Supplemental oxygen as needed. CPAP if she is willing to try. Mobilize. Wean oxygen as tolerated for saturations >92%. 6. Essential hypertension: Uncontrolled. Worsened by acute illness. Currently on metoprolol 50 MG twice a day, increase to 75 MG twice a day. Continue when necessary IV labetalol. Also being driven by acute illness. If blood pressures continue to be elevated, may consider adding amlodipine. 7. Electrolytes (hypokalemia, hypomagnesemia, hypophosphatemia): Replace and follow as needed. Discussed with pharmacy. 8. History of sleeve gastrectomy: 9. Anemia: Secondary to critical illness. Stable. Follow CBCs. 10. Morbid obesity/Body mass index is 41.4 kg/m. 11. Leukocytosis: Secondary to acute pancreatitis. Remains on IV meropenem.    DVT prophylaxis: Heparin Code Status: Full Family Communication: Discussed in detail with patient's mother at bedside. Disposition: Continue management in stepdown unit for several days.   Consultants:  PCCM-signed off 7/2 [facility] Interventional radiology -to place NG tube and right upper extremity PICC  line  Procedures:  Postpyloric NG tube PICC line  Antimicrobials:  Zosyn-discontinued. IV meropenem 6/30 >    Subjective:  On 7/1, set up in chair for approximately 8 hours, had to loose BMs, ate small amounts but not much appetite , abdominal pain waxing and waning but overall better.  ROS: Denies dizziness, lightheadedness, nausea, vomiting or dysuria.  Objective:  Vitals:   04/20/17 0800 04/20/17 0806 04/20/17 0900 04/20/17 1000  BP: (!) 177/113  (!) 166/105 (!) 154/95  Pulse:      Resp: (!) 25 (!) 27 (!) 29 17  Temp:      TempSrc:      SpO2: 95% (!) 30% 90% 90%  Weight:      Height:      Pulse rate 125/m, temperature 98.52F.  Examination:  General exam: Pleasant young female lying comfortably propped up in bed. Continues to look better compared to prior 2 days. Respiratory system: Slightly diminished breath sounds in the bases but otherwise clear to auscultation. No increased work of breathing. Cardiovascular system: S1 and S2 heard, regular tachycardia. No JVD, murmurs or pedal edema. Telemetry: Sinus tachycardia in the 120s-130s. Gastrointestinal system: Abdomen is soft, nondistended and nontender. NG tube feedings ongoing. No organomegaly or masses appreciated. Normal bowel sounds heard. Central nervous system: Alert and oriented. No focal neurological deficits. Stable without change. Extremities: Symmetric 5 x 5 power. Stable. Skin: No rashes, lesions or ulcers Psychiatry: Judgement and insight appear normal. Mood & affect appropriate. In good spirits this morning.    Data Reviewed: I have personally reviewed following labs and imaging studies  CBC:  Recent Labs Lab 04/15/17 0507 04/16/17 0623 04/16/17 1052 04/17/17 0433 04/18/17 1047 04/19/17 0400 04/20/17 0348  WBC 16.2* 13.8*  --  16.8* 16.8* 19.9* 23.9*  NEUTROABS 12.6* 10.7*  --   --  13.1*  --   --   HGB 11.8* 8.7* 9.9* 9.4* 8.9* 8.7* 8.6*  HCT 37.3 27.1* 31.0* 29.5* 27.9* 27.1* 26.4*  MCV 81.8  81.4  --  81.3 80.6 80.7 80.0  PLT 190 161  --  186 213 258 360   Basic Metabolic Panel:  Recent Labs Lab 04/16/17 0623 04/17/17 0433  04/17/17 1438 04/18/17 1047 04/18/17 1700 04/19/17 0400 04/20/17 0348  NA 140 141  --   --  140  --  140 139  K 3.4* 3.2*  --   --  3.2*  --  3.5 3.3*  CL 107 103  --   --  101  --  102 103  CO2 25 32  --   --  31  --  30 29  GLUCOSE 116* 135*  --   --  197*  --  131* 137*  BUN 8 7  --   --  7  --  6 8  CREATININE 0.56 0.62  --   --  0.57  --  0.53 0.54  CALCIUM 7.7* 7.9*  --   --  7.6*  --  7.9* 8.0*  MG  --   --   < > 2.6* 2.0 1.9 1.8 1.8  PHOS  --   --   --  1.8* 2.2* 2.3* 2.3* 1.8*  < > = values in this interval not displayed. Liver Function Tests:  Recent Labs Lab 04/16/17 0623 04/17/17 0433 04/18/17 1047 04/19/17 0400 04/20/17 0348  AST 28 25 35 38 40  ALT 78* 69* 56* 54 54  ALKPHOS 42 51 62 62 79  BILITOT 1.5* 1.6* 0.9 0.5 0.6  PROT 4.8* 5.6* 5.4* 5.6* 6.0*  ALBUMIN 2.2* 2.5* 2.3* 2.3* 2.3*    CBG:  Recent  Labs Lab 04/19/17 1526 04/19/17 1946 04/19/17 2313 04/20/17 0357 04/20/17 0828  GLUCAP 123* 122* 147* 137* 160*    Recent Results (from the past 240 hour(s))  MRSA PCR Screening     Status: None   Collection Time: 04/13/17  2:50 AM  Result Value Ref Range Status   MRSA by PCR NEGATIVE NEGATIVE Final    Comment:        The GeneXpert MRSA Assay (FDA approved for NASAL specimens only), is one component of a comprehensive MRSA colonization surveillance program. It is not intended to diagnose MRSA infection nor to guide or monitor treatment for MRSA infections.   Culture, blood (x 2)     Status: None   Collection Time: 04/14/17 11:00 PM  Result Value Ref Range Status   Specimen Description SITE NOT SPECIFIED  Final   Special Requests Blood Culture adequate volume  Final   Culture NO GROWTH 5 DAYS  Final   Report Status 04/19/2017 FINAL  Final  Culture, blood (x 2)     Status: None   Collection Time:  04/14/17 11:06 PM  Result Value Ref Range Status   Specimen Description SITE NOT SPECIFIED  Final   Special Requests Blood Culture adequate volume  Final   Culture NO GROWTH 5 DAYS  Final   Report Status 04/19/2017 FINAL  Final  Culture, blood (Routine X 2) w Reflex to ID Panel     Status: None (Preliminary result)   Collection Time: 04/17/17 12:29 PM  Result Value Ref Range Status   Specimen Description BLOOD RIGHT HAND  Final   Special Requests Blood Culture adequate volume  Final   Culture NO GROWTH 3 DAYS  Final   Report Status PENDING  Incomplete  Culture, blood (Routine X 2) w Reflex to ID Panel     Status: None (Preliminary result)   Collection Time: 04/17/17 12:29 PM  Result Value Ref Range Status   Specimen Description BLOOD PICC LINE DRAW  Final   Special Requests   Final    Blood Culture results may not be optimal due to an inadequate volume of blood received in culture bottles   Culture NO GROWTH 3 DAYS  Final   Report Status PENDING  Incomplete         Radiology Studies: No results found.      Scheduled Meds: . ALPRAZolam  0.25 mg Oral Daily  . chlorhexidine  15 mL Mouth Rinse BID  . Chlorhexidine Gluconate Cloth  6 each Topical Daily  . heparin  5,000 Units Subcutaneous Q8H  . HYDROmorphone   Intravenous Q4H  . insulin aspart  2-6 Units Subcutaneous Q4H  . lipase/protease/amylase  36,000 Units Oral TID BM  . lipase/protease/amylase  72,000 Units Oral TID WC  . mouth rinse  15 mL Mouth Rinse q12n4p  . metoprolol tartrate  50 mg Oral BID  . multivitamin  15 mL Oral Daily  . pantoprazole (PROTONIX) IV  40 mg Intravenous Q12H  . polyethylene glycol  17 g Oral Daily  . sodium chloride flush  10-40 mL Intracatheter Q12H   Continuous Infusions: . feeding supplement (VITAL AF 1.2 CAL) 1,000 mL (04/20/17 1048)  . meropenem (MERREM) IV Stopped (04/20/17 4098)  . potassium phosphate IVPB (mmol) 20 mmol (04/20/17 0828)     LOS: 7 days     Talonda Artist,  MD, FACP, FHM. [facility] Pager [phone]  If 7PM-7AM, please contact night-coverage [email or web address] Password [email or web address] 04/20/2017, 11:17 AM

## 2017-04-20 NOTE — Progress Notes (Signed)
Physical Therapy Treatment Patient Details Name: Kaylee Montgomery MRN: [id number] DOB: April 15, 1981 Today's Date: 04/20/2017    History of Present Illness Nicoya Friel is a 36yo black female who comes to ED with severe LUQ pain, N/V. She is noted to have acute pancreatitis, has since been with tachycardia in 120-140s, and HTN. As of 6/26, CXR revealing pulmonary edema, vascular congestion, cardiomegaly, and basilar atelectasis. PMH: pituitary tumor, mirgraine, OAS c CPAP non-compliance. At baseline, pt lives with 36yo son, and works for social services, unlimited in community distance mobility.     PT Comments    The patient is much stronger today. Limited ambulation due to lines. HR max 138. Sats on 4 liters>92% . Continue PT while in acute care.  Follow Up Recommendations  Home health PT;Supervision for mobility/OOB     Equipment Recommendations  Rolling walker with 5" wheels    Recommendations for Other Services       Precautions / Restrictions Precautions Precautions: Fall Precaution Comments: bed rest when HR is above 130-do not see any  orders for this_, has multiple lines-panda, picc, on 4 L O2    Mobility  Bed Mobility   Bed Mobility: Sit to Supine       Sit to supine: Min assist   General bed mobility comments: assist with legs, cues to lie to side  Transfers Overall transfer level: Needs assistance Equipment used: Rolling walker (2 wheeled) Transfers: Sit to/from Stand Sit to Stand: Min assist            Ambulation/Gait Ambulation/Gait assistance: Min assist;+2 safety/equipment Ambulation Distance (Feet): 10 Feet (then 4 steps forward and backward x 2 due to lines) Assistive device: Rolling walker (2 wheeled) Gait Pattern/deviations: Step-through pattern     General Gait Details: HR 138 max with mobility . HR at rest 120 while up in recliner x 2 hours   Stairs            Wheelchair Mobility    Modified Rankin (Stroke Patients Only)        Balance                                            Cognition Arousal/Alertness: Awake/alert Behavior During Therapy: WFL for tasks assessed/performed Overall Cognitive Status: Within Functional Limits for tasks assessed                                        Exercises      General Comments        Pertinent Vitals/Pain Faces Pain Scale: Hurts even more Pain Location: abdomen Pain Intervention(s): PCA encouraged    Home Living                      Prior Function            PT Goals (current goals can now be found in the care plan section) Progress towards PT goals: Progressing toward goals    Frequency    Min 3X/week      PT Plan Current plan remains appropriate;Frequency needs to be updated    Co-evaluation              AM-PAC PT "6 Clicks" Daily Activity  Outcome Measure  Difficulty turning over in bed (including adjusting  bedclothes, sheets and blankets)?: Total Difficulty moving from lying on back to sitting on the side of the bed? : Total Difficulty sitting down on and standing up from a chair with arms (e.g., wheelchair, bedside commode, etc,.)?: Total Help needed moving to and from a bed to chair (including a wheelchair)?: Total Help needed walking in hospital room?: Total Help needed climbing 3-5 steps with a railing? : Total 6 Click Score: 6    End of Session Equipment Utilized During Treatment: Oxygen Activity Tolerance: Patient tolerated treatment well Patient left: in bed;with call bell/phone within reach;with bed alarm set;with family/visitor present Nurse Communication: Mobility status PT Visit Diagnosis: Muscle weakness (generalized) (M62.81);Difficulty in walking, not elsewhere classified (R26.2)     Time: 6433-2951 PT Time Calculation (min) (ACUTE ONLY): 28 min  Charges:  $Gait Training: 23-37 mins                    G CodesTresa Endo PT [phone]    Claretha Cooper 04/20/2017, 4:06 PM

## 2017-04-20 NOTE — Progress Notes (Signed)
Pt refused CPAP qhs.  Education provided but Pt refused.  Pt encouraged to contact RT if she changes her mind.

## 2017-04-20 NOTE — Telephone Encounter (Signed)
Pt's mother Abigail Butts [place]) called office to see if Dr. Oneida Alar had completed pt's FMLA papers. Informed her that papers aren't at the front desk, but I would send message to Dr. Oneida Alar. She can be reached at [phone]. Routing to Dr. Oneida Alar.

## 2017-04-21 ENCOUNTER — Telehealth: Payer: Self-pay | Admitting: Gastroenterology

## 2017-04-21 LAB — CBC
HCT: 25.7 % — ABNORMAL LOW (ref 36.0–46.0)
Hemoglobin: 8.3 g/dL — ABNORMAL LOW (ref 12.0–15.0)
MCH: 25.6 pg — ABNORMAL LOW (ref 26.0–34.0)
MCHC: 32.3 g/dL (ref 30.0–36.0)
MCV: 79.3 fL (ref 78.0–100.0)
Platelets: 380 10*3/uL (ref 150–400)
RBC: 3.24 MIL/uL — ABNORMAL LOW (ref 3.87–5.11)
RDW: 14.2 % (ref 11.5–15.5)
WBC: 22.6 10*3/uL — ABNORMAL HIGH (ref 4.0–10.5)

## 2017-04-21 LAB — GLUCOSE, CAPILLARY
Glucose-Capillary: 137 mg/dL — ABNORMAL HIGH (ref 65–99)
Glucose-Capillary: 144 mg/dL — ABNORMAL HIGH (ref 65–99)
Glucose-Capillary: 160 mg/dL — ABNORMAL HIGH (ref 65–99)
Glucose-Capillary: 166 mg/dL — ABNORMAL HIGH (ref 65–99)
Glucose-Capillary: 180 mg/dL — ABNORMAL HIGH (ref 65–99)
Glucose-Capillary: 186 mg/dL — ABNORMAL HIGH (ref 65–99)

## 2017-04-21 LAB — COMPREHENSIVE METABOLIC PANEL
ALT: 48 U/L (ref 14–54)
AST: 35 U/L (ref 15–41)
Albumin: 2.3 g/dL — ABNORMAL LOW (ref 3.5–5.0)
Alkaline Phosphatase: 76 U/L (ref 38–126)
Anion gap: 6 (ref 5–15)
BUN: 8 mg/dL (ref 6–20)
CO2: 30 mmol/L (ref 22–32)
Calcium: 8.1 mg/dL — ABNORMAL LOW (ref 8.9–10.3)
Chloride: 103 mmol/L (ref 101–111)
Creatinine, Ser: 0.43 mg/dL — ABNORMAL LOW (ref 0.44–1.00)
GFR calc Af Amer: >60 mL/min (ref >60)
GFR calc non Af Amer: >60 mL/min (ref >60)
Glucose, Bld: 145 mg/dL — ABNORMAL HIGH (ref 65–99)
Potassium: 3.4 mmol/L — ABNORMAL LOW (ref 3.5–5.1)
Sodium: 139 mmol/L (ref 135–145)
Total Bilirubin: 0.4 mg/dL (ref 0.3–1.2)
Total Protein: 6 g/dL — ABNORMAL LOW (ref 6.5–8.1)

## 2017-04-21 LAB — PHOSPHORUS: Phosphorus: 2.4 mg/dL — ABNORMAL LOW (ref 2.5–4.6)

## 2017-04-21 MED ORDER — POTASSIUM PHOSPHATES 15 MMOLE/5ML IV SOLN
20.0000 mmol | Freq: Once | INTRAVENOUS | Status: AC
  Administered 2017-04-21: 20 mmol via INTRAVENOUS
  Filled 2017-04-21: qty 6.67

## 2017-04-21 MED ORDER — POTASSIUM CHLORIDE 20 MEQ/15ML (10%) PO SOLN
20.0000 meq | ORAL | Status: AC
  Administered 2017-04-21 (×2): 20 meq
  Filled 2017-04-21 (×2): qty 15

## 2017-04-21 MED ORDER — MAGNESIUM CHLORIDE 64 MG PO TBEC
1.0000 | DELAYED_RELEASE_TABLET | Freq: Every morning | ORAL | Status: DC
  Administered 2017-04-21 – 2017-04-29 (×9): 64 mg via ORAL
  Filled 2017-04-21 (×10): qty 1

## 2017-04-21 NOTE — Telephone Encounter (Signed)
PLEASE CALL PT'S MOTHER SHE CAN PICK UP FMLA PAPERWORK ON THUR @1  pm.

## 2017-04-21 NOTE — Progress Notes (Addendum)
MEDICATION RELATED CONSULT NOTE - Follow-up  Pharmacy Consult for Phosphorus repletion  Allergies  Allergen Reactions  . Bromocriptine Mesylate Nausea Only  . Depo-Medrol [Methylprednisolone Acetate] Itching and Rash  . Imitrex [Sumatriptan] Rash    Patient Measurements: Height: 5\' 4"  (162.6 cm) Weight: 240 lb 8.4 oz (109.1 kg) IBW/kg (Calculated) : 54.7  Vital Signs: Temp: 98.9 F (37.2 C) (07/03 0337) Temp Source: Oral (07/03 0337) BP: 121/89 (07/03 0800) Pulse Rate: 129 (07/02 2128)  Labs:  Recent Labs  04/18/17 1700 04/19/17 0400 04/20/17 0348 04/21/17 0343  WBC  --  19.9* 23.9* 22.6*  HGB  --  8.7* 8.6* 8.3*  HCT  --  27.1* 26.4* 25.7*  PLT  --  258 360 380  CREATININE  --  0.53 0.54 0.43*  MG 1.9 1.8 1.8  --   PHOS 2.3* 2.3* 1.8* 2.4*  ALBUMIN  --  2.3* 2.3* 2.3*  PROT  --  5.6* 6.0* 6.0*  AST  --  38 40 35  ALT  --  54 54 48  ALKPHOS  --  62 79 76  BILITOT  --  0.5 0.6 0.4   Estimated Creatinine Clearance: 116.3 mL/min (A) (by C-G formula based on SCr of 0.43 mg/dL (L)).  Medical History: Past Medical History:  Diagnosis Date  . Breast mass 02/2017  . Dental crown present   . Hypertension    states BP fluctuates; has been on med. x 1 yr.  . Migraines   . Pituitary adenoma ([place])   . Seasonal allergies   . Sleep apnea    no CPAP use in > 6 mos.     Assessment: 16 yoF presented on 6/24 with necrotizing pancreatitis.   Diet: Heart healthy, Vital AF tube feeds.  7/3: Low K and low Phos (given 63meq KCL via tube this am)   Goal of Therapy:  Phos 2.5 - 4.6  Plan:   K Phos 20 mmol IV x1 dose  Also provides ~ 30 mEq of K+.    Recheck Phos with AM labs and continue to replete as needed.  Doreene Eland, PharmD, BCPS.   Pager: [phone] 04/21/2017 8:31 AM

## 2017-04-21 NOTE — Telephone Encounter (Signed)
Called and informed pt's mother.

## 2017-04-21 NOTE — Progress Notes (Signed)
PROGRESS NOTE   Kaylee Montgomery  ZCH:[id number]    DOB: 01-13-1981    DOA: 04/12/2017  PCP: [name], MD   I have briefly reviewed patients previous medical records in [facility].  Brief Narrative:  35  HTN,  Migraine, pituitary adenoma, OSA noncompliant with CPAP, no history of alcohol abuse, initially admitted 6.25.18 to [facility] acute pancreatitis (elevated LFTs, lipase 6000 range, CT and ultrasound abdomen confirming same). Subsequently worsened, ARDS suspected, clinical concern for decompensation/need for intubation, no GI coverage at [facility] on the weekend, hence transferred to [facility] ICU 6.27 CCM evaluated and felt patient appropriate under hospitalist care.  [facility] consulted. CCM signed off.  Slowly improving but expect a slow course. Remains in stepdown unit.   Assessment & Plan:   Principal Problem:   Acute pancreatitis Active Problems:   PITUITARY ADENOMA   Morbid obesity (HCC)   Seasonal and perennial allergic rhinitis   Prediabetes   Obstructive sleep apnea   S/P laparoscopic sleeve gastrectomy   Transaminitis   Anemia   Abdominal pain        SIRS without infectious etiology Acute necrotizing pancreatitis--Cryptogenic No medications to suggest etiology. No prior gallstones. No family history of pancreatitis.  Proximal splenic vein thrombosis--?could leukocytosis be 2/2 to micro splenic infarcts ? gallstone pancreatitis from sludge/microlithiasis alcohol abuse and no use since April 2018. triglycerides on admission wnl.  [facility] follow-up appreciated. Low-fat diet as tolerated, eating small amounts, encouraged to increase intake as tolerated.  continue postpyloric tube feeding. DC tube feeds when able to eat better Has received 7 days broad-spectrum antibiotics--have discontinued meropenem for now--low threshold to add Zosyn if needed however  Follow-up CT in a week or if she worsens.  Dilaudid PCA for  pain management-better controlled. Slowly improving.   SIRS  Secondary to problem #1. Continues with intermittent high fevers, persistent sinus tachycardia and  intermittent elevated blood pressures.   Leukocytosis 21 range and steady-. Blood cultures 2 each from 6/26 and 6/29:   NGTD. Chest x-ray 6/30: No acute disease. Incentive spirometry. Mobilize as tolerated >sat up in chair for approximately 8 hours on 7/1.  Despite SIRS physiology, looks better and seems to be doing better.  No anticoagulation for splenic thrombosis Continue Lovenox for DVT prophylaxis.  Pain is still being managed with PCA, will need to eventually taper to oral meds and oral equivalents Abnormal LFTs: Hepatitis panel negative. Per GI. Resolved. Acute respiratory failure with hypoxia ohss HABITUS/ OSA:  Related to SIRS, atelectasis, untreated OSA. Incentive spirometry.  Supplemental oxygen as needed.  CPAP if she is willing to try. Mobilize. Wean oxygen as tolerated for saturations >92% Essential hypertension: Uncontrolled. Worsened by acute illness. Currently on metoprolol 50 MG twice a day, increase to 75 MG twice a day. Continue when necessary IV labetalol. Also being driven by acute illness. If blood pressures continue to be elevated, may consider adding amlodipine. Electrolytes (hypokalemia, hypomagnesemia, hypophosphatemia): Replace and follow as needed. Discussed with pharmacy. Anemia: Secondary to critical illness. Stable. Follow CBCs. History of sleeve gastrectomy: Morbid obesity/Body mass index is 41.29 kg/m. Leukocytosis: Secondary to acute pancreatitis.     DVT prophylaxis: Heparin Code Status: Full Family Communication: Discussed in detail with patient's mother and grandmother on telephone 7/3 Disposition: Continue management in stepdown unit for several days.   Consultants:  PCCM-signed off 7/2 [facility] Interventional radiology -to place NG tube and right upper extremity PICC  line  Procedures:  Postpyloric NG tube placed on PICC line  Antimicrobials:  Zosyn-discontinued. IV meropenem 6/30 > 7/3   Subjective:  Alert pleasant oriented no apparent distress States pain is 7/10 and improved from admission Eating some fevers, had general, had some juice this morning Had a bowel movement yesterday No nausea no vomiting   Objective:  Vitals:   04/21/17 0354 04/21/17 0400 04/21/17 0500 04/21/17 0600  BP:  (!) 153/99 (!) 159/105 (!) 164/107  Pulse:      Resp: 16 19 18  (!) 24  Temp:      TempSrc:      SpO2: 96% 98% 96% 95%  Weight:      Height:      Pulse rate 125/m, temperature 98.49F.  Examination:  Obese pleasant anxious S1 and S2 no murmur rub or gallop Abdomen is slightly tender in the epigastric Chest is clinically clear No lower extremity edema Neurologically intact     Data Reviewed: I have personally reviewed following labs and imaging studies  CBC:  Recent Labs Lab 04/15/17 0507 04/16/17 0623  04/17/17 0433 04/18/17 1047 04/19/17 0400 04/20/17 0348 04/21/17 0343  WBC 16.2* 13.8*  --  16.8* 16.8* 19.9* 23.9* 22.6*  NEUTROABS 12.6* 10.7*  --   --  13.1*  --   --   --   HGB 11.8* 8.7*  < > 9.4* 8.9* 8.7* 8.6* 8.3*  HCT 37.3 27.1*  < > 29.5* 27.9* 27.1* 26.4* 25.7*  MCV 81.8 81.4  --  81.3 80.6 80.7 80.0 79.3  PLT 190 161  --  186 213 258 360 380  < > = values in this interval not displayed. Basic Metabolic Panel:  Recent Labs Lab 04/17/17 0433  04/17/17 1438 04/18/17 1047 04/18/17 1700 04/19/17 0400 04/20/17 0348 04/21/17 0343  NA 141  --   --  140  --  140 139 139  K 3.2*  --   --  3.2*  --  3.5 3.3* 3.4*  CL 103  --   --  101  --  102 103 103  CO2 32  --   --  31  --  30 29 30   GLUCOSE 135*  --   --  197*  --  131* 137* 145*  BUN 7  --   --  7  --  6 8 8   CREATININE 0.62  --   --  0.57  --  0.53 0.54 0.43*  CALCIUM 7.9*  --   --  7.6*  --  7.9* 8.0* 8.1*  MG  --   < > 2.6* 2.0 1.9 1.8 1.8  --   PHOS  --    < > 1.8* 2.2* 2.3* 2.3* 1.8* 2.4*  < > = values in this interval not displayed. Liver Function Tests:  Recent Labs Lab 04/17/17 0433 04/18/17 1047 04/19/17 0400 04/20/17 0348 04/21/17 0343  AST 25 35 38 40 35  ALT 69* 56* 54 54 48  ALKPHOS 51 62 62 79 76  BILITOT 1.6* 0.9 0.5 0.6 0.4  PROT 5.6* 5.4* 5.6* 6.0* 6.0*  ALBUMIN 2.5* 2.3* 2.3* 2.3* 2.3*    CBG:  Recent Labs Lab 04/20/17 1218 04/20/17 1702 04/20/17 1952 04/20/17 2319 04/21/17 0335  GLUCAP 144* 131* 160* 149* 137*    Recent Results (from the past 240 hour(s))  MRSA PCR Screening     Status: None   Collection Time: 04/13/17  2:50 AM  Result Value Ref Range Status   MRSA by PCR NEGATIVE NEGATIVE Final  Comment:        The GeneXpert MRSA Assay (FDA approved for NASAL specimens only), is one component of a comprehensive MRSA colonization surveillance program. It is not intended to diagnose MRSA infection nor to guide or monitor treatment for MRSA infections.   Culture, blood (x 2)     Status: None   Collection Time: 04/14/17 11:00 PM  Result Value Ref Range Status   Specimen Description SITE NOT SPECIFIED  Final   Special Requests Blood Culture adequate volume  Final   Culture NO GROWTH 5 DAYS  Final   Report Status 04/19/2017 FINAL  Final  Culture, blood (x 2)     Status: None   Collection Time: 04/14/17 11:06 PM  Result Value Ref Range Status   Specimen Description SITE NOT SPECIFIED  Final   Special Requests Blood Culture adequate volume  Final   Culture NO GROWTH 5 DAYS  Final   Report Status 04/19/2017 FINAL  Final  Culture, blood (Routine X 2) w Reflex to ID Panel     Status: None (Preliminary result)   Collection Time: 04/17/17 12:29 PM  Result Value Ref Range Status   Specimen Description BLOOD RIGHT HAND  Final   Special Requests Blood Culture adequate volume  Final   Culture NO GROWTH 3 DAYS  Final   Report Status PENDING  Incomplete  Culture, blood (Routine X 2) w Reflex to ID  Panel     Status: None (Preliminary result)   Collection Time: 04/17/17 12:29 PM  Result Value Ref Range Status   Specimen Description BLOOD PICC LINE DRAW  Final   Special Requests   Final    Blood Culture results may not be optimal due to an inadequate volume of blood received in culture bottles   Culture NO GROWTH 3 DAYS  Final   Report Status PENDING  Incomplete      Radiology Studies: No results found.   Scheduled Meds: . ALPRAZolam  0.25 mg Oral Daily  . chlorhexidine  15 mL Mouth Rinse BID  . Chlorhexidine Gluconate Cloth  6 each Topical Daily  . heparin  5,000 Units Subcutaneous Q8H  . HYDROmorphone   Intravenous Q4H  . insulin aspart  2-6 Units Subcutaneous Q4H  . lipase/protease/amylase  36,000 Units Oral TID BM  . lipase/protease/amylase  72,000 Units Oral TID WC  . magnesium chloride  1 tablet Oral q morning - 10a  . mouth rinse  15 mL Mouth Rinse q12n4p  . metoprolol tartrate  75 mg Oral BID  . multivitamin  15 mL Oral Daily  . pantoprazole (PROTONIX) IV  40 mg Intravenous Q12H  . polyethylene glycol  17 g Oral Daily  . potassium chloride  20 mEq Per Tube Q4H  . sodium chloride flush  10-40 mL Intracatheter Q12H   Continuous Infusions: . feeding supplement (VITAL AF 1.2 CAL) 1,000 mL (04/21/17 0600)  . meropenem (MERREM) IV Stopped (04/21/17 0540)     LOS: 8 days   Verneita Griffes, MD [facility] [phone]

## 2017-04-21 NOTE — Progress Notes (Signed)
[facility] ADULT ICU REPLACEMENT PROTOCOL FOR AM LAB REPLACEMENT ONLY  The patient does apply for the [facility] Adult ICU Electrolyte Replacment Protocol based on the criteria listed below:   1. Is GFR >/= 40 ml/min? Yes.    Patient's GFR today is >60 2. Is urine output >/= 0.5 ml/kg/hr for the last 6 hours? Yes.   Patient's UOP is 0.89 ml/kg/hr 3. Is BUN < 60 mg/dL? Yes.    Patient's BUN today is 8 4. Abnormal electrolyte  K 3.4 5. Ordered repletion with: per protocol 6. If a panic level lab has been reported, has the CCM MD in charge been notified? Yes.  .   Physician:  Philbert Riser 04/21/2017 4:52 AM

## 2017-04-21 NOTE — Telephone Encounter (Signed)
Pt's mother Abigail Butts [place]) called to follow up on the FMLA papers that she brought by Friday last week. I told her that SF has been out of the office and would be back on Thursday and that I would send her a reminder about the FMLA papers. Mother agreed. She can be reached at [phone]

## 2017-04-21 NOTE — Telephone Encounter (Signed)
SEE PREVIOUS TC.

## 2017-04-21 NOTE — Progress Notes (Signed)
Pt continues to refuse CPAP QHS.  RT to monitor and assess as needed.  

## 2017-04-21 NOTE — Progress Notes (Signed)
[facility]ology Progress Note  Kaylee Montgomery 36 y.o. January 18, 1981  CC:  Acute necrotizing pancreatitis   Subjective: Patient continues to have on and off abdominal pain. Tolerating diet without any vomiting. She states that she has some good days and some bad days. Passing stool.  ROS : Positive for weakness. Negative for chest pain. Negative for dizziness.   Objective: Vital signs in last 24 hours: Vitals:   04/21/17 0800 04/21/17 0804  BP: 121/89   Pulse:    Resp: (!) 30 (!) 27  Temp:      Physical Exam:  General:  Alert, cooperative, no distress, appears stated age. NG tube in place   Head:  Normocephalic, without obvious abnormality, atraumatic  Eyes:  , EOM's intact,   Lungs:   Occasional rales. Unlabored respiration   Heart:  Tachycardia S1, S2 normal  Abdomen:   Mild distended, epigastric /Diffuse tenderness to palpation, bowel sounds active all four quadrants,  no masses, no peritoneal signs   Extremities: Extremities normal, atraumatic, trace  edema  Pulses: 2+ and symmetric    Lab Results:  Recent Labs  04/19/17 0400 04/20/17 0348 04/21/17 0343  NA 140 139 139  K 3.5 3.3* 3.4*  CL 102 103 103  CO2 30 29 30   GLUCOSE 131* 137* 145*  BUN 6 8 8   CREATININE 0.53 0.54 0.43*  CALCIUM 7.9* 8.0* 8.1*  MG 1.8 1.8  --   PHOS 2.3* 1.8* 2.4*    Recent Labs  04/20/17 0348 04/21/17 0343  AST 40 35  ALT 54 48  ALKPHOS 79 76  BILITOT 0.6 0.4  PROT 6.0* 6.0*  ALBUMIN 2.3* 2.3*    Recent Labs  04/18/17 1047  04/20/17 0348 04/21/17 0343  WBC 16.8*  < > 23.9* 22.6*  NEUTROABS 13.1*  --   --   --   HGB 8.9*  < > 8.6* 8.3*  HCT 27.9*  < > 26.4* 25.7*  MCV 80.6  < > 80.0 79.3  PLT 213  < > 360 380  < > = values in this interval not displayed. No results for input(s): LABPROT, INR in the last 72 hours.    Assessment/Plan: - acute necrotizing pancreatitis. Initially diagnosed with acute pancreatitis on 04/13/2017, repeat CT on 04/17/2017 showed  necrotizing pancreatitis. - Fever with tachycardia. Most likely reactive from underlying necrotizing pancreatitis.Had a fever of 101.4 yesterday  - proximal splenic vein thrombosis - abnormal LFTs. Negative hepatitis panel. Resolved  Recommendations ------------------------- - Continue current management for now.  - No clear etiology for her pancreatitis. Possible gallstone pancreatitis from sludge/microlithiasis.  Denied alcohol use since April. No new medications. Normal triglycerides on admission.no family history of chronic pancreatitis - MiraLAX as needed for constipation - GI will follow    Otis Brace MD, FACP 04/21/2017, 8:46 AM  Pager [phone]  If no answer or after 5 PM call [phone]

## 2017-04-22 LAB — GLUCOSE, CAPILLARY
Glucose-Capillary: 171 mg/dL — ABNORMAL HIGH (ref 65–99)
Glucose-Capillary: 167 mg/dL — ABNORMAL HIGH (ref 65–99)
Glucose-Capillary: 188 mg/dL — ABNORMAL HIGH (ref 65–99)
Glucose-Capillary: 202 mg/dL — ABNORMAL HIGH (ref 65–99)
Glucose-Capillary: 170 mg/dL — ABNORMAL HIGH (ref 65–99)
Glucose-Capillary: 149 mg/dL — ABNORMAL HIGH (ref 65–99)

## 2017-04-22 LAB — CULTURE, BLOOD (ROUTINE X 2)
Culture: NO GROWTH
Culture: NO GROWTH
Special Requests: ADEQUATE

## 2017-04-22 LAB — COMPREHENSIVE METABOLIC PANEL
ALT: 52 U/L (ref 14–54)
AST: 36 U/L (ref 15–41)
Albumin: 2.6 g/dL — ABNORMAL LOW (ref 3.5–5.0)
Alkaline Phosphatase: 105 U/L (ref 38–126)
Anion gap: 6 (ref 5–15)
BUN: 9 mg/dL (ref 6–20)
CO2: 29 mmol/L (ref 22–32)
Calcium: 8.3 mg/dL — ABNORMAL LOW (ref 8.9–10.3)
Chloride: 102 mmol/L (ref 101–111)
Creatinine, Ser: 0.42 mg/dL — ABNORMAL LOW (ref 0.44–1.00)
GFR calc Af Amer: >60 mL/min (ref >60)
GFR calc non Af Amer: >60 mL/min (ref >60)
Glucose, Bld: 170 mg/dL — ABNORMAL HIGH (ref 65–99)
Potassium: 4 mmol/L (ref 3.5–5.1)
Sodium: 137 mmol/L (ref 135–145)
Total Bilirubin: 0.7 mg/dL (ref 0.3–1.2)
Total Protein: 6.7 g/dL (ref 6.5–8.1)

## 2017-04-22 LAB — CBC
HCT: 26.7 % — ABNORMAL LOW (ref 36.0–46.0)
Hemoglobin: 8.5 g/dL — ABNORMAL LOW (ref 12.0–15.0)
MCH: 25.2 pg — ABNORMAL LOW (ref 26.0–34.0)
MCHC: 31.8 g/dL (ref 30.0–36.0)
MCV: 79.2 fL (ref 78.0–100.0)
Platelets: 486 10*3/uL — ABNORMAL HIGH (ref 150–400)
RBC: 3.37 MIL/uL — ABNORMAL LOW (ref 3.87–5.11)
RDW: 14 % (ref 11.5–15.5)
WBC: 24.3 10*3/uL — ABNORMAL HIGH (ref 4.0–10.5)

## 2017-04-22 LAB — PHOSPHORUS: Phosphorus: 2.6 mg/dL (ref 2.5–4.6)

## 2017-04-22 LAB — MAGNESIUM: Magnesium: 1.9 mg/dL (ref 1.7–2.4)

## 2017-04-22 MED ORDER — CLONIDINE HCL 0.1 MG PO TABS
0.2000 mg | ORAL_TABLET | Freq: Two times a day (BID) | ORAL | Status: DC
  Filled 2017-04-22: qty 2

## 2017-04-22 MED ORDER — CLONIDINE HCL 0.2 MG PO TABS
0.2000 mg | ORAL_TABLET | Freq: Two times a day (BID) | ORAL | Status: DC
  Administered 2017-04-22 – 2017-04-29 (×13): 0.2 mg via ORAL
  Filled 2017-04-22 (×2): qty 2
  Filled 2017-04-22 (×7): qty 1
  Filled 2017-04-22: qty 2
  Filled 2017-04-22 (×2): qty 1

## 2017-04-22 MED ORDER — K PHOS MONO-SOD PHOS DI & MONO 155-852-130 MG PO TABS
250.0000 mg | ORAL_TABLET | Freq: Three times a day (TID) | ORAL | Status: AC
  Administered 2017-04-22 (×3): 250 mg
  Filled 2017-04-22 (×4): qty 1

## 2017-04-22 NOTE — Progress Notes (Signed)
PROGRESS NOTE   Kaylee Montgomery  QJF:[id number]    DOB: 1980/11/28    DOA: 04/12/2017  PCP: [name], MD   I have briefly reviewed patients previous medical records in [facility].  Brief Narrative:   35  HTN,  Migraine, pituitary adenoma, OSA noncompliant with CPAP no history of alcohol abuse  initially admitted 6.25.18 to [facility] acute pancreatitis (elevated LFTs, lipase 6000 range, CT and ultrasound abdomen confirming same). Subsequently worsened, ARDS suspected, clinical concern for decompensation/need for intubation, no GI coverage at [facility] on the weekend, hence transferred to [facility] ICU 6.27 CCM evaluated and felt patient appropriate under hospitalist care.  [facility] consulted. CCM signed off. Slowly improving but expect a slow course. Remains in stepdown unit.   Assessment & Plan:   Principal Problem:   Acute pancreatitis Active Problems:   PITUITARY ADENOMA   Morbid obesity (HCC)   Seasonal and perennial allergic rhinitis   Prediabetes   Obstructive sleep apnea   S/P laparoscopic sleeve gastrectomy   Transaminitis   Anemia   Abdominal pain  SIRS without infectious etiology Acute necrotizing pancreatitis--Cryptogenic No medications to suggest etiology. No prior gallstones. No family history of pancreatitis.  Proximal splenic vein thrombosis--?could leukocytosis be 2/2 to micro splenic infarcts ? gallstone pancreatitis from sludge/microlithiasis alcohol abuse and no use since April 2018. triglycerides on admission wnl.  [facility] follow-up appreciated. Low-fat diet as tolerated, eating small amounts, encouraged to increase intake as tolerated.  continue postpyloric tube feeding. DC tube feeds when able to eat better Has received 7 days broad-spectrum antibiotics--have discontinued meropenem for now--low threshold to add Zosyn if needed however Follow-up CT as per GI  Dilaudid PCA for pain management-better  controlled. Slowly improving.  SIRS Secondary to problem #1. Continues with intermittent high fevers, persistent sinus tachycardia and  intermittent elevated blood pressures.   Leukocytosis 21 range and steady-. Blood cultures 2 each from 6/26 and 6/29:   NGTD. Chest x-ray 6/30: No acute disease. Incentive spirometry. Mobilize as tolerated >sat up in chair for approximately 8 hours on 7/1.  Despite SIRS physiology, looks better and seems to be doing better.  No anticoagulation for splenic thrombosis Continue Lovenox for DVT prophylaxis.  Pain is still being managed with PCA, will need to eventually taper to oral meds and oral equivalents--we will negotiate this in the next 24 hours--will conside rpallaitve team input re: dosing of meds Abnormal LFTs: Hepatitis panel negative. Per GI. Resolved. Acute respiratory failure with hypoxia ohss HABITUS/ OSA:  Related to SIRS, atelectasis, untreated OSA. Incentive spirometry.  Supplemental oxygen as needed.  CPAP if she is willing to try. Mobilize. Wean oxygen as tolerated for saturations >92% Essential hypertension:   Worsened by acute illness.  Currently on metoprolol 50 MG twice a day, increase to 75 MG twice a day.   Continue when necessary IV labetalol.  Added clonidine 0.2 bid [might also help with anxiety] Electrolytes (hypokalemia, hypomagnesemia, hypophosphatemia):   Replace and follow as needed. Discussed with pharmacy. Anemia:   Secondary to critical illness. Stable. Follow CBCs. History of sleeve gastrectomy: Morbid obesity/Body mass index is 40.19 kg/m. Leukocytosis: Secondary to acute pancreatitis.     DVT prophylaxis: Heparin Code Status: Full Family Communication:  Disposition: Continue management in stepdown unit for several days.   Consultants:   PCCM-signed off 7/2 [facility] Interventional radiology -to place NG tube and right upper extremity PICC line  Procedures:  Postpyloric NG tube placed on  PICC  line  Antimicrobials:  Zosyn-discontinued. IV meropenem 6/30 > 7/3   Subjective:  Awake alert and sitting No n/v  Eating some clears no cp No wheezing  Objective:  Vitals:   04/22/17 0344 04/22/17 0400 04/22/17 0500 04/22/17 0600  BP:  (!) 150/99 (!) 157/107 (!) 156/100  Pulse:      Resp: 17 17 (!) 23 18  Temp:      TempSrc:      SpO2: 96% 98% 98% 97%  Weight:      Height:      Pulse rate 125/m, temperature 98.30F.  Examination:  Obese pleasant anxious S1 and S2 no murmur rub or gallop Abdomen is slightly tender in the epigastric Chest is clinically clear No lower extremity edema Neurologically intact  Data Reviewed: I have personally reviewed following labs and imaging studies  CBC:  Recent Labs Lab 04/16/17 0623  04/18/17 1047 04/19/17 0400 04/20/17 0348 04/21/17 0343 04/22/17 0317  WBC 13.8*  < > 16.8* 19.9* 23.9* 22.6* 24.3*  NEUTROABS 10.7*  --  13.1*  --   --   --   --   HGB 8.7*  < > 8.9* 8.7* 8.6* 8.3* 8.5*  HCT 27.1*  < > 27.9* 27.1* 26.4* 25.7* 26.7*  MCV 81.4  < > 80.6 80.7 80.0 79.3 79.2  PLT 161  < > 213 258 360 380 486*  < > = values in this interval not displayed. Basic Metabolic Panel:  Recent Labs Lab 04/18/17 1047 04/18/17 1700 04/19/17 0400 04/20/17 0348 04/21/17 0343 04/22/17 0300 04/22/17 0317 04/22/17 0330  NA 140  --  140 139 139  --  137  --   K 3.2*  --  3.5 3.3* 3.4*  --  4.0  --   CL 101  --  102 103 103  --  102  --   CO2 31  --  30 29 30   --  29  --   GLUCOSE 197*  --  131* 137* 145*  --  170*  --   BUN 7  --  6 8 8   --  9  --   CREATININE 0.57  --  0.53 0.54 0.43*  --  0.42*  --   CALCIUM 7.6*  --  7.9* 8.0* 8.1*  --  8.3*  --   MG 2.0 1.9 1.8 1.8  --   --   --  1.9  PHOS 2.2* 2.3* 2.3* 1.8* 2.4* 2.6  --   --    Liver Function Tests:  Recent Labs Lab 04/18/17 1047 04/19/17 0400 04/20/17 0348 04/21/17 0343 04/22/17 0317  AST 35 38 40 35 36  ALT 56* 54 54 48 52  ALKPHOS 62 62 79 76 105  BILITOT 0.9  0.5 0.6 0.4 0.7  PROT 5.4* 5.6* 6.0* 6.0* 6.7  ALBUMIN 2.3* 2.3* 2.3* 2.3* 2.6*    CBG:  Recent Labs Lab 04/21/17 1312 04/21/17 1549 04/21/17 1908 04/21/17 2341 04/22/17 0310  GLUCAP 186* 160* 144* 166* 171*    Recent Results (from the past 240 hour(s))  MRSA PCR Screening     Status: None   Collection Time: 04/13/17  2:50 AM  Result Value Ref Range Status   MRSA by PCR NEGATIVE NEGATIVE Final    Comment:        The GeneXpert MRSA Assay (FDA approved for NASAL specimens only), is one component of a comprehensive MRSA colonization surveillance program. It is not intended to diagnose MRSA infection nor to guide  or monitor treatment for MRSA infections.   Culture, blood (x 2)     Status: None   Collection Time: 04/14/17 11:00 PM  Result Value Ref Range Status   Specimen Description SITE NOT SPECIFIED  Final   Special Requests Blood Culture adequate volume  Final   Culture NO GROWTH 5 DAYS  Final   Report Status 04/19/2017 FINAL  Final  Culture, blood (x 2)     Status: None   Collection Time: 04/14/17 11:06 PM  Result Value Ref Range Status   Specimen Description SITE NOT SPECIFIED  Final   Special Requests Blood Culture adequate volume  Final   Culture NO GROWTH 5 DAYS  Final   Report Status 04/19/2017 FINAL  Final  Culture, blood (Routine X 2) w Reflex to ID Panel     Status: None (Preliminary result)   Collection Time: 04/17/17 12:29 PM  Result Value Ref Range Status   Specimen Description BLOOD RIGHT HAND  Final   Special Requests Blood Culture adequate volume  Final   Culture NO GROWTH 4 DAYS  Final   Report Status PENDING  Incomplete  Culture, blood (Routine X 2) w Reflex to ID Panel     Status: None (Preliminary result)   Collection Time: 04/17/17 12:29 PM  Result Value Ref Range Status   Specimen Description BLOOD PICC LINE DRAW  Final   Special Requests   Final    Blood Culture results may not be optimal due to an inadequate volume of blood received in  culture bottles   Culture NO GROWTH 4 DAYS  Final   Report Status PENDING  Incomplete      Radiology Studies: No results found.   Scheduled Meds: . ALPRAZolam  0.25 mg Oral Daily  . chlorhexidine  15 mL Mouth Rinse BID  . Chlorhexidine Gluconate Cloth  6 each Topical Daily  . heparin  5,000 Units Subcutaneous Q8H  . HYDROmorphone   Intravenous Q4H  . insulin aspart  2-6 Units Subcutaneous Q4H  . lipase/protease/amylase  36,000 Units Oral TID BM  . lipase/protease/amylase  72,000 Units Oral TID WC  . magnesium chloride  1 tablet Oral q morning - 10a  . mouth rinse  15 mL Mouth Rinse q12n4p  . metoprolol tartrate  75 mg Oral BID  . multivitamin  15 mL Oral Daily  . pantoprazole (PROTONIX) IV  40 mg Intravenous Q12H  . polyethylene glycol  17 g Oral Daily  . sodium chloride flush  10-40 mL Intracatheter Q12H   Continuous Infusions: . feeding supplement (VITAL AF 1.2 CAL) 1,000 mL (04/22/17 0600)     LOS: 9 days   Verneita Griffes, MD [facility] [phone]

## 2017-04-22 NOTE — Progress Notes (Signed)
MEDICATION RELATED CONSULT NOTE - Follow-up  Pharmacy Consult for Phosphorus repletion  Allergies  Allergen Reactions  . Bromocriptine Mesylate Nausea Only  . Depo-Medrol [Methylprednisolone Acetate] Itching and Rash  . Imitrex [Sumatriptan] Rash    Patient Measurements: Height: 5\' 4"  (162.6 cm) Weight: 234 lb 2.1 oz (106.2 kg) IBW/kg (Calculated) : 54.7  Vital Signs: Temp: 99.3 F (37.4 C) (07/04 0800) Temp Source: Oral (07/04 0800) BP: 156/100 (07/04 0600) Pulse Rate: 126 (07/03 2128)  Labs:  Recent Labs  04/20/17 0348 04/21/17 0343 04/22/17 0300 04/22/17 0317  WBC 23.9* 22.6*  --  24.3*  HGB 8.6* 8.3*  --  8.5*  HCT 26.4* 25.7*  --  26.7*  PLT 360 380  --  486*  CREATININE 0.54 0.43*  --  0.42*  MG 1.8  --   --   --   PHOS 1.8* 2.4* 2.6  --   ALBUMIN 2.3* 2.3*  --  2.6*  PROT 6.0* 6.0*  --  6.7  AST 40 35  --  36  ALT 54 48  --  52  ALKPHOS 79 76  --  105  BILITOT 0.6 0.4  --  0.7   Estimated Creatinine Clearance: 114.5 mL/min (A) (by C-G formula based on SCr of 0.42 mg/dL (L)).  Medical History: Past Medical History:  Diagnosis Date  . Breast mass 02/2017  . Dental crown present   . Hypertension    states BP fluctuates; has been on med. x 1 yr.  . Migraines   . Pituitary adenoma ([place])   . Seasonal allergies   . Sleep apnea    no CPAP use in > 6 mos.     Assessment: 36 yoF presented on 6/24 with necrotizing pancreatitis.   Diet: Heart healthy, Vital AF tube feeds.  7/4: Phos improved to low end goal, K+ Improved  Goal of Therapy:  Phos 2.5 - 4.6  Plan:   Neutra-Phos 250mg  (8 mmol Phos) TID x 3 doses today    Recheck Phos with AM labs and continue to replete as needed.  Doreene Eland, PharmD, BCPS.   Pager: [phone] 04/22/2017 8:03 AM

## 2017-04-22 NOTE — Progress Notes (Signed)
Physical Therapy Treatment Patient Details Name: Kaylee Montgomery MRN: [id number] DOB: 1980-12-25 Today's Date: 04/22/2017    History of Present Illness Kaylee Montgomery is a 36yo black female who comes to ED with severe LUQ pain, N/V. She is noted to have acute pancreatitis, has since been with tachycardia in 120-140s, and HTN. As of 6/26, CXR revealing pulmonary edema, vascular congestion, cardiomegaly, and basilar atelectasis. PMH: pituitary tumor, mirgraine, OAS c CPAP non-compliance. 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     PT Comments    Pt motivated this am and with marked improvement in activity tolerance.  Follow Up Recommendations  Home health PT;Supervision for mobility/OOB     Equipment Recommendations  Rolling walker with 5" wheels    Recommendations for Other Services OT consult     Precautions / Restrictions Precautions Precautions: Fall Restrictions Weight Bearing Restrictions: No    Mobility  Bed Mobility Overal bed mobility: Needs Assistance Bed Mobility: Supine to Sit     Supine to sit: Min assist;HOB elevated     General bed mobility comments: Increased time  Transfers Overall transfer level: Needs assistance Equipment used: Rolling walker (2 wheeled) Transfers: Sit to/from Stand Sit to Stand: Min assist;+2 safety/equipment;From elevated surface         General transfer comment: cues for use of UEs to self assist  Ambulation/Gait Ambulation/Gait assistance: Min assist;+2 safety/equipment Ambulation Distance (Feet): 500 Feet Assistive device: Rolling walker (2 wheeled) Gait Pattern/deviations: Step-through pattern Gait velocity: decr Gait velocity interpretation: Below normal speed for age/gender General Gait Details: Increased time with multiple standing rests and cues for posture and position from [employer]            Wheelchair Mobility    Modified Rankin  (Stroke Patients Only)       Balance Overall balance assessment: Needs assistance Sitting-balance support: Feet supported;No upper extremity supported Sitting balance-Leahy Scale: Good     Standing balance support: Bilateral upper extremity supported Standing balance-Leahy Scale: Poor                              Cognition Arousal/Alertness: Awake/alert Behavior During Therapy: WFL for tasks assessed/performed Overall Cognitive Status: Within Functional Limits for tasks assessed                                        Exercises      General Comments        Pertinent Vitals/Pain Pain Assessment: 0-10 Pain Score: 5  Pain Location: abdomen Pain Descriptors / Indicators: Aching Pain Intervention(s): PCA encouraged;Limited activity within patient's tolerance;Monitored during session    Home Living                      Prior Function            PT Goals (current goals can now be found in the care plan section) Acute Rehab PT Goals Patient Stated Goal: walk PT Goal Formulation: With patient Progress towards PT goals: Progressing toward goals    Frequency    Min 3X/week      PT Plan Current plan remains appropriate    Co-evaluation              AM-PAC PT "6 Clicks" Daily Activity  Outcome Measure  Difficulty turning over in bed (including adjusting bedclothes, sheets and blankets)?: Total Difficulty moving from lying on back to sitting on the side of the bed? : Total Difficulty sitting down on and standing up from a chair with arms (e.g., wheelchair, bedside commode, etc,.)?: Total Help needed moving to and from a bed to chair (including a wheelchair)?: A Little Help needed walking in hospital room?: A Little Help needed climbing 3-5 steps with a railing? : A Lot 6 Click Score: 11    End of Session   Activity Tolerance: Patient tolerated treatment well Patient left: Other (comment) [facility]) Nurse Communication:  Mobility status PT Visit Diagnosis: Muscle weakness (generalized) (M62.81);Difficulty in walking, not elsewhere classified (R26.2)     Time: 0981-1914 PT Time Calculation (min) (ACUTE ONLY): 38 min  Charges:  $Gait Training: 23-37 mins $Therapeutic Activity: 8-22 mins                    G Codes:       Pg [phone]    Matika Bartell 04/22/2017, 12:29 PM

## 2017-04-22 NOTE — Progress Notes (Signed)
Pt continues to refuse CPAP QHS.  RT to monitor and assess as needed.  

## 2017-04-23 LAB — GLUCOSE, CAPILLARY
Glucose-Capillary: 149 mg/dL — ABNORMAL HIGH (ref 65–99)
Glucose-Capillary: 179 mg/dL — ABNORMAL HIGH (ref 65–99)
Glucose-Capillary: 177 mg/dL — ABNORMAL HIGH (ref 65–99)
Glucose-Capillary: 213 mg/dL — ABNORMAL HIGH (ref 65–99)
Glucose-Capillary: 180 mg/dL — ABNORMAL HIGH (ref 65–99)
Glucose-Capillary: 129 mg/dL — ABNORMAL HIGH (ref 65–99)
Glucose-Capillary: 161 mg/dL — ABNORMAL HIGH (ref 65–99)

## 2017-04-23 LAB — URINALYSIS, ROUTINE W REFLEX MICROSCOPIC
Bacteria, UA: NONE SEEN
Bilirubin Urine: NEGATIVE
Glucose, UA: NEGATIVE mg/dL
Ketones, ur: NEGATIVE mg/dL
Leukocytes, UA: NEGATIVE
Nitrite: NEGATIVE
Protein, ur: 100 mg/dL — AB
Specific Gravity, Urine: 1.019 (ref 1.005–1.030)
Squamous Epithelial / LPF: NONE SEEN
pH: 7 (ref 5.0–8.0)

## 2017-04-23 LAB — COMPREHENSIVE METABOLIC PANEL
ALT: 39 U/L (ref 14–54)
AST: 28 U/L (ref 15–41)
Albumin: 2.6 g/dL — ABNORMAL LOW (ref 3.5–5.0)
Alkaline Phosphatase: 85 U/L (ref 38–126)
Anion gap: 9 (ref 5–15)
BUN: 11 mg/dL (ref 6–20)
CO2: 25 mmol/L (ref 22–32)
Calcium: 8.4 mg/dL — ABNORMAL LOW (ref 8.9–10.3)
Chloride: 99 mmol/L — ABNORMAL LOW (ref 101–111)
Creatinine, Ser: 0.43 mg/dL — ABNORMAL LOW (ref 0.44–1.00)
GFR calc Af Amer: >60 mL/min (ref >60)
GFR calc non Af Amer: >60 mL/min (ref >60)
Glucose, Bld: 164 mg/dL — ABNORMAL HIGH (ref 65–99)
Potassium: 4 mmol/L (ref 3.5–5.1)
Sodium: 133 mmol/L — ABNORMAL LOW (ref 135–145)
Total Bilirubin: 0.5 mg/dL (ref 0.3–1.2)
Total Protein: 6.6 g/dL (ref 6.5–8.1)

## 2017-04-23 LAB — CBC
HCT: 26.4 % — ABNORMAL LOW (ref 36.0–46.0)
Hemoglobin: 8.5 g/dL — ABNORMAL LOW (ref 12.0–15.0)
MCH: 25.5 pg — ABNORMAL LOW (ref 26.0–34.0)
MCHC: 32.2 g/dL (ref 30.0–36.0)
MCV: 79.3 fL (ref 78.0–100.0)
Platelets: 620 10*3/uL — ABNORMAL HIGH (ref 150–400)
RBC: 3.33 MIL/uL — ABNORMAL LOW (ref 3.87–5.11)
RDW: 14.1 % (ref 11.5–15.5)
WBC: 24 10*3/uL — ABNORMAL HIGH (ref 4.0–10.5)

## 2017-04-23 LAB — PHOSPHORUS: Phosphorus: 3.1 mg/dL (ref 2.5–4.6)

## 2017-04-23 MED ORDER — ENSURE ENLIVE PO LIQD
237.0000 mL | Freq: Two times a day (BID) | ORAL | Status: DC
  Administered 2017-04-23 – 2017-04-29 (×8): 237 mL via ORAL

## 2017-04-23 NOTE — Progress Notes (Signed)
MEDICATION RELATED CONSULT NOTE   Pharmacy Consult for PCA Indication: pain control, pancreatitis  Vital Signs: Temp: 98.7 F (37.1 C) (07/05 0800) Temp Source: Oral (07/05 0800) BP: 143/91 (07/05 0630)  Labs:  Recent Labs  04/21/17 0343 04/22/17 0300 04/22/17 0317 04/22/17 0330 04/23/17 0314  WBC 22.6*  --  24.3*  --  24.0*  HGB 8.3*  --  8.5*  --  8.5*  HCT 25.7*  --  26.7*  --  26.4*  PLT 380  --  486*  --  620*  CREATININE 0.43*  --  0.42*  --  0.43*  MG  --   --   --  1.9  --   PHOS 2.4* 2.6  --   --  3.1  ALBUMIN 2.3*  --  2.6*  --  2.6*  PROT 6.0*  --  6.7  --  6.6  AST 35  --  36  --  28  ALT 48  --  52  --  39  ALKPHOS 76  --  105  --  85  BILITOT 0.4  --  0.7  --  0.5   Estimated Creatinine Clearance: 114.6 mL/min (A) (by C-G formula based on SCr of 0.43 mg/dL (L)).  Medications:  Scheduled:  . ALPRAZolam  0.25 mg Oral Daily  . chlorhexidine  15 mL Mouth Rinse BID  . Chlorhexidine Gluconate Cloth  6 each Topical Daily  . cloNIDine  0.2 mg Oral BID  . heparin  5,000 Units Subcutaneous Q8H  . HYDROmorphone   Intravenous Q4H  . insulin aspart  2-6 Units Subcutaneous Q4H  . lipase/protease/amylase  36,000 Units Oral TID BM  . lipase/protease/amylase  72,000 Units Oral TID WC  . magnesium chloride  1 tablet Oral q morning - 10a  . mouth rinse  15 mL Mouth Rinse q12n4p  . metoprolol tartrate  75 mg Oral BID  . multivitamin  15 mL Oral Daily  . pantoprazole (PROTONIX) IV  40 mg Intravenous Q12H  . polyethylene glycol  17 g Oral Daily  . sodium chloride flush  10-40 mL Intracatheter Q12H   Assessment: 36yo female with pancreatitis and pain difficult to control.  Full dose dilaudid PCA started 6/27, but lockout time was increased to 12 minutes on 6/29 for increased drowsiness.  She continues to have well managed pain scores on PCA, but will need to transition to oral medications in the next 24-48 hours per MD.  PCA Doses used: 6/28 5.1 mg 6/29 6.3  mg 6/30: 25 mg 7/1: 14.4 mg 7/2 18.6 mg 7/3 18.1 mg 7/4 15.9 mg  Today, 04/23/2017: Pain scores 0-4 RASS 0 EtCO2 monitoring  LBM reported on 7/4.    Goal of Therapy:  Improved pain control  Plan:  Continue same full dose PCA with lockout interval changed to custom 12 minutes. Max dose per hour set at 1.25mg .  Information for change to PO medication: Hydromorphone 16 mg IV (~ 24 hours dose) = Oxycodone 213 mg PO ~ 50% dose as long acting Oxycontin 40 mg BID (total 80mg ) or 40mg  TID (total 120 mg) Oxycodone 10 mg PO q6h PRN (total 40mg  if all doses are used)  Gretta Arab PharmD, BCPS Pager [phone] 04/23/2017 8:52 AM

## 2017-04-23 NOTE — Progress Notes (Signed)
[facility] Gastroenterology Progress Note  ARAH ARO 36 y.o. Aug 01, 1981  CC:  Acute necrotizing pancreatitis   Subjective:  Patient continues to have epigastric pain. Still not able to eat adequately because of abdominal distention and pain.  She remains on tube feeding. Having loose stools with tube feeding but denied any diarrhea. Denied any blood in the stool or black stool  ROS : Positive for weakness. Negative for chest pain. Negative for dizziness.   Objective: Vital signs in last 24 hours: Vitals:   04/23/17 0800 04/23/17 1022  BP:  (!) 163/98  Pulse:    Resp: (!) 21 (!) 29  Temp: 98.7 F (37.1 C)     Physical Exam:  General:  Alert, cooperative, no distress, appears stated age. NG tube in place   Head:  Normocephalic, without obvious abnormality, atraumatic  Eyes:  , EOM's intact,   Lungs:   Occasional rales. Unlabored respiration   Heart:  Tachycardia S1, S2 normal  Abdomen:   Mild distended, epigastric  tenderness to palpation, bowel sounds active all four quadrants,  no masses, no peritoneal signs   Extremities: Extremities normal, atraumatic, trace  edema  Pulses: 2+ and symmetric    Lab Results:  Recent Labs  04/22/17 0300 04/22/17 0317 04/22/17 0330 04/23/17 0314  NA  --  137  --  133*  K  --  4.0  --  4.0  CL  --  102  --  99*  CO2  --  29  --  25  GLUCOSE  --  170*  --  164*  BUN  --  9  --  11  CREATININE  --  0.42*  --  0.43*  CALCIUM  --  8.3*  --  8.4*  MG  --   --  1.9  --   PHOS 2.6  --   --  3.1    Recent Labs  04/22/17 0317 04/23/17 0314  AST 36 28  ALT 52 39  ALKPHOS 105 85  BILITOT 0.7 0.5  PROT 6.7 6.6  ALBUMIN 2.6* 2.6*    Recent Labs  04/22/17 0317 04/23/17 0314  WBC 24.3* 24.0*  HGB 8.5* 8.5*  HCT 26.7* 26.4*  MCV 79.2 79.3  PLT 486* 620*   No results for input(s): LABPROT, INR in the last 72 hours.    Assessment/Plan: - acute necrotizing pancreatitis. Initially diagnosed with acute pancreatitis on  04/13/2017, repeat CT on 04/17/2017 showed necrotizing pancreatitis. - Fever with tachycardia. Most likely reactive from underlying necrotizing pancreatitis.Had a fever of 100.4 yesterday  - proximal splenic vein thrombosis - abnormal LFTs. Negative hepatitis panel. Resolved  Recommendations ------------------------- - Continue current management for now. Antibiotics has been discontinued. - Recommend CT abdomen pelvis if she has worsening abdominal pain or if she is not able to tolerate oral diet. - Consider DC tube feedings/Dobbhoff tube only if she is able to tolerate adequate oral diet. - D/C Miralax.   - No clear etiology for her pancreatitis. Possible gallstone pancreatitis from sludge/microlithiasis.  Denied alcohol use since April. No new medications. Normal triglycerides on admission.no family history of chronic pancreatitis  - GI will follow    Otis Brace MD, [place] 04/23/2017, 12:06 PM  Pager [phone]  If no answer or after 5 PM call [phone]

## 2017-04-23 NOTE — Progress Notes (Signed)
Pt continues to refuse CPAP QHS.  RT to monitor and assess as needed.  

## 2017-04-23 NOTE — Telephone Encounter (Signed)
Noted  

## 2017-04-23 NOTE — Progress Notes (Signed)
Physical Therapy Treatment Patient Details Name: Kaylee Montgomery MRN: [id number] DOB: 1981/04/22 Today's Date: 04/23/2017    History of Present Illness Kaylee Montgomery is a 36yo black female who comes to ED with severe LUQ pain, N/V. She is noted to have acute pancreatitis, has since been with tachycardia in 120-140s, and HTN. As of 6/26, CXR revealing pulmonary edema, vascular congestion, cardiomegaly, and basilar atelectasis. PMH: pituitary tumor, mirgraine, OAS c CPAP non-compliance. 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     PT Comments    Pt in good spirits and demonstrating improvement of speed and stability during ambulation.   Follow Up Recommendations  Home health PT;Supervision for mobility/OOB     Equipment Recommendations  Rolling walker with 5" wheels    Recommendations for Other Services OT consult     Precautions / Restrictions Precautions Precautions: Fall Restrictions Weight Bearing Restrictions: No    Mobility  Bed Mobility               General bed mobility comments: NT - pt up in chair and returned to same  Transfers Overall transfer level: Needs assistance Equipment used: Rolling walker (2 wheeled) Transfers: Sit to/from Stand Sit to Stand: Min assist;+2 safety/equipment         General transfer comment: cues for use of UEs to self assist  Ambulation/Gait Ambulation/Gait assistance: Min assist;+2 safety/equipment Ambulation Distance (Feet): 500 Feet Assistive device: Rolling walker (2 wheeled) Gait Pattern/deviations: Step-through pattern Gait velocity: decr Gait velocity interpretation: Below normal speed for age/gender General Gait Details: Increased time with several standing rests and cues for posture and position from RW   Stairs            Wheelchair Mobility    Modified Rankin (Stroke Patients Only)       Balance Overall balance assessment: Needs  assistance Sitting-balance support: Feet supported;No upper extremity supported Sitting balance-Leahy Scale: Good     Standing balance support: Bilateral upper extremity supported Standing balance-Leahy Scale: Poor                              Cognition Arousal/Alertness: Awake/alert Behavior During Therapy: WFL for tasks assessed/performed Overall Cognitive Status: Within Functional Limits for tasks assessed                                        Exercises      General Comments        Pertinent Vitals/Pain Pain Assessment: 0-10 Pain Score: 5  Pain Location: abdomen Pain Descriptors / Indicators: Aching Pain Intervention(s): Limited activity within patient's tolerance;Monitored during session;PCA encouraged    Home Living                      Prior Function            PT Goals (current goals can now be found in the care plan section) Acute Rehab PT Goals Patient Stated Goal: walk PT Goal Formulation: With patient Progress towards PT goals: Progressing toward goals    Frequency    Min 3X/week      PT Plan Current plan remains appropriate    Co-evaluation              AM-PAC PT "6 Clicks" Daily Activity  Outcome Measure  Difficulty turning over in bed (including adjusting bedclothes, sheets and blankets)?: A Lot Difficulty moving from lying on back to sitting on the side of the bed? : Total Difficulty sitting down on and standing up from a chair with arms (e.g., wheelchair, bedside commode, etc,.)?: Total Help needed moving to and from a bed to chair (including a wheelchair)?: A Little Help needed walking in hospital room?: A Little Help needed climbing 3-5 steps with a railing? : A Lot 6 Click Score: 12    End of Session Equipment Utilized During Treatment: Oxygen Activity Tolerance: Patient tolerated treatment well Patient left: in chair;with call bell/phone within reach;with family/visitor present Nurse  Communication: Mobility status PT Visit Diagnosis: Muscle weakness (generalized) (M62.81);Difficulty in walking, not elsewhere classified (R26.2)     Time: 4098-1191 PT Time Calculation (min) (ACUTE ONLY): 26 min  Charges:  $Gait Training: 23-37 mins                    G Codes:       Pg [phone]    Kaylee Montgomery 04/23/2017, 12:33 PM

## 2017-04-23 NOTE — Progress Notes (Signed)
MEDICATION RELATED CONSULT NOTE - Follow-up  Pharmacy Consult for Phosphorus repletion  Allergies  Allergen Reactions  . Bromocriptine Mesylate Nausea Only  . Depo-Medrol [Methylprednisolone Acetate] Itching and Rash  . Imitrex [Sumatriptan] Rash    Patient Measurements: Height: 5\' 4"  (162.6 cm) Weight: 234 lb 12.6 oz (106.5 kg) IBW/kg (Calculated) : 54.7  Vital Signs: Temp: 98.9 F (37.2 C) (07/05 0315) Temp Source: Oral (07/05 0315) BP: 143/91 (07/05 0630)  Labs:  Recent Labs  04/21/17 0343 04/22/17 0300 04/22/17 0317 04/22/17 0330 04/23/17 0314  WBC 22.6*  --  24.3*  --  24.0*  HGB 8.3*  --  8.5*  --  8.5*  HCT 25.7*  --  26.7*  --  26.4*  PLT 380  --  486*  --  620*  CREATININE 0.43*  --  0.42*  --  0.43*  MG  --   --   --  1.9  --   PHOS 2.4* 2.6  --   --  3.1  ALBUMIN 2.3*  --  2.6*  --  2.6*  PROT 6.0*  --  6.7  --  6.6  AST 35  --  36  --  28  ALT 48  --  52  --  39  ALKPHOS 76  --  105  --  85  BILITOT 0.4  --  0.7  --  0.5   Estimated Creatinine Clearance: 114.6 mL/min (A) (by C-G formula based on SCr of 0.43 mg/dL (L)).  Medical History: Past Medical History:  Diagnosis Date  . Breast mass 02/2017  . Dental crown present   . Hypertension    states BP fluctuates; has been on med. x 1 yr.  . Migraines   . Pituitary adenoma ([place])   . Seasonal allergies   . Sleep apnea    no CPAP use in > 6 mos.     Assessment: 74 yoF presented on 6/24 with necrotizing pancreatitis.   Diet: Heart healthy - currently only eating ~25% of breakfast Vital AF tube feeds at 60 ml/hr  7/5: Phos improved 3.1, in goal range  Goal of Therapy:  Phos 2.5 - 4.6  Plan:   No phosphorus replacement today  Recheck Phos with AM labs and continue to replete as needed.  Gretta Arab PharmD, BCPS Pager [phone] 04/23/2017 7:23 AM

## 2017-04-23 NOTE — Telephone Encounter (Signed)
Pt's mother is aware that she can pick up FMLA papers today at 1pm. Per SF no charge.

## 2017-04-23 NOTE — Progress Notes (Signed)
Nutrition Follow-up  DOCUMENTATION CODES:   Obesity unspecified  INTERVENTION:   - Continue Vital AF 1.2 @  60 ml/hr.  -This provides 1728 kcal, 108g protein and 1168 ml H2O.  -Ordered Calorie Count  -Continue liquid MVI daily. -Provide Ensure Enlive po BID, each supplement provides 350 kcal and 20 grams of protein  RD will continue to monitor  NUTRITION DIAGNOSIS:   Inadequate oral intake related to inability to eat, acute illness, nausea, vomiting, altered GI function as evidenced by meal completion < 50%.  Ongoing.  GOAL:   Patient will meet greater than or equal to 90% of their needs  Meeting with TF.  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 Presented to ED with abdominal pain, nausea, vomiting. Worked up for acute pancreatitis w/o pseudocyst. Admitted for management.   Patient in room with father at bedside. Pt reports not eating well. Has breakfast tray at bedside and mainly only sipping on ginger ale. She states she was drinking fluids adequately until her foley catheter was removed. Pt with many excuses as to why her intake is poor. Pt has not requested any protein supplements from unit as encouraged by RD in previous visit. Pt is open to trying them now, RD will place order. Pt seems eager to have NJT removed but isn't really trying to increase intake. Will continue TF at goal rate d/t pt's continued poor PO. The only solid foods the patient has attempted to try has been carbohydrates such as potatoes, rolls, sherbet, etc.  Will place order for calorie count to assess protein consumption.   Medications: CREON capsule TID w/ meals and between meals, SLOW-MAG tablet daily, liquid MVI, IV Protonix BID, Miralax packet daily  Labs reviewed: CBGs: 177-179 Low Na Mg/Phos WNL  Diet Order:  Diet Heart Room service appropriate? Yes;  Fluid consistency: Thin  Skin:  Reviewed, no issues  Last BM:  7/4  Height:   Ht Readings from Last 1 Encounters:  04/17/17 5\' 4"  (1.626 m)    Weight:   Wt Readings from Last 1 Encounters:  04/23/17 234 lb 12.6 oz (106.5 kg)    Ideal Body Weight:  52.27 kg  BMI:  Body mass index is 40.3 kg/m.  Estimated Nutritional Needs:   Kcal:  1650-1900 (32-36 kcal/kg ibw)  Protein:  90-105g (1.7-2 g/kg IBW)  Fluid:  Per MD  EDUCATION NEEDS:   No education needs identified at this time  Clayton Bibles, MS, RD, LDN Pager: [phone] After Hours Pager: [phone]

## 2017-04-23 NOTE — Progress Notes (Signed)
PROGRESS NOTE   Kaylee Montgomery  IEP:[id number]    DOB: 1981-07-19    DOA: 04/12/2017  PCP: [name], MD   I have briefly reviewed patients previous medical records in [facility].  Brief Narrative:   35  HTN,  Migraine, pituitary adenoma, OSA noncompliant with CPAP no history of alcohol abuse  initially admitted 6.25.18 to [facility] acute pancreatitis (elevated LFTs, lipase 6000 range, CT and ultrasound abdomen confirming same). Subsequently worsened, ARDS suspected, clinical concern for decompensation/need for intubation, no GI coverage at [facility] on the weekend, hence transferred to [facility] ICU 6.27 CCM evaluated and felt patient appropriate under hospitalist care.  [facility] consulted. CCM signed off. Slowly improving but expect a slow course. Remains in stepdown unit.   Assessment & Plan:   Principal Problem:   Acute pancreatitis Active Problems:   PITUITARY ADENOMA   Morbid obesity (HCC)   Seasonal and perennial allergic rhinitis   Prediabetes   Obstructive sleep apnea   S/P laparoscopic sleeve gastrectomy   Transaminitis   Anemia   Abdominal pain  SIRS without infectious etiology Acute necrotizing pancreatitis--Cryptogenic No medications to suggest etiology. No prior gallstones. No family history of pancreatitis.  Proximal splenic vein thrombosis--?could leukocytosis be 2/2 to micro splenic infarcts ? gallstone pancreatitis from sludge/microlithiasis alcohol abuse and no use since April 2018. triglycerides on admission wnl.  [facility] Following-agree with  postpyloric tube feeding-anticipate it'll probably be discharged with this  My discussion with gastroenterologist-pseudocyst can probably compress stomach, this may take weeks to resolve in terms of tolerance of full diet received 7 days Zosyn, meropenem ending on 7/2 Dilaudid PCA for pain management-better controlled. Slowly improving.  SIRS  intermittent high  fevers, persistent sinus tachycardia and intermittent elevated blood pressures.  Leukocytosis 20 range- Blood cultures 2 each from 6/26 and 6/29:  NGTD. Chest x-ray 6/30: No acute disease. Incentive spirometry.  No anticoagulation for splenic thrombosis Creon tid Continue Lovenox for DVT prophylaxis.  Pain managed  PCA-pharm D helping with rec's to oral equivalents Abnormal LFTs: Hepatitis panel negative. Per GI. Resolved. Acute respiratory failure with hypoxia ohss HABITUS/ OSA:  Related to SIRS, atelectasis, untreated OSA. Incentive spirometry.  Supplemental oxygen as needed.  CPAP if she is willing to try. Mobilize. Wean oxygen as tolerated for saturations >92% Essential hypertension:   Worsened by acute illness.  Currently on metoprolol 50 MG twice a day, increase to 75 MG twice a day.   Continue when necessary IV labetalol.  Added clonidine 0.2 bid [might also help with anxiety] Electrolytes (hypokalemia, hypomagnesemia, hypophosphatemia):   Replace and follow as needed. Discussed with pharmacy. Anemia:   Secondary to critical illness. Stable. Follow CBCs. History of sleeve gastrectomy: Morbid obesity/Body mass index is 40.3 kg/m. Leukocytosis: Secondary to acute pancreatitis.     DVT prophylaxis: Heparin Code Status: Full Family Communication:  Disposition: Continue management in stepdown unit for several days.   Consultants:   PCCM-signed off 7/2 [facility] Interventional radiology -to place NG tube and right upper extremity PICC line  Procedures:  Postpyloric NG tube placed on PICC line  Antimicrobials:  Zosyn-discontinued. IV meropenem 6/30 > 7/3   Subjective:  Anxious Worried about having a UTI because Foley catheter was pulled yesterday and was having bleeding   Objective:  Vitals:   04/23/17 1443 04/23/17 1500 04/23/17 1555 04/23/17 1600  BP:      Pulse:      Resp: 20  18  Temp:    98.2 F (36.8 C)  TempSrc:    Axillary  SpO2: 94% 95% 95%  96%  Weight:      Height:      Pulse rate 125/m, temperature 98.49F.  Examination:  Pleasant obese but a little anxious cta b abd soft nt nd no rebound no guard Obese No le edema neuro moving all 4 limbs   Data Reviewed: I have personally reviewed following labs and imaging studies  CBC:  Recent Labs Lab 04/18/17 1047 04/19/17 0400 04/20/17 0348 04/21/17 0343 04/22/17 0317 04/23/17 0314  WBC 16.8* 19.9* 23.9* 22.6* 24.3* 24.0*  NEUTROABS 13.1*  --   --   --   --   --   HGB 8.9* 8.7* 8.6* 8.3* 8.5* 8.5*  HCT 27.9* 27.1* 26.4* 25.7* 26.7* 26.4*  MCV 80.6 80.7 80.0 79.3 79.2 79.3  PLT 213 258 360 380 486* 324*   Basic Metabolic Panel:  Recent Labs Lab 04/18/17 1047 04/18/17 1700 04/19/17 0400 04/20/17 0348 04/21/17 0343 04/22/17 0300 04/22/17 0317 04/22/17 0330 04/23/17 0314  NA 140  --  140 139 139  --  137  --  133*  K 3.2*  --  3.5 3.3* 3.4*  --  4.0  --  4.0  CL 101  --  102 103 103  --  102  --  99*  CO2 31  --  30 29 30   --  29  --  25  GLUCOSE 197*  --  131* 137* 145*  --  170*  --  164*  BUN 7  --  6 8 8   --  9  --  11  CREATININE 0.57  --  0.53 0.54 0.43*  --  0.42*  --  0.43*  CALCIUM 7.6*  --  7.9* 8.0* 8.1*  --  8.3*  --  8.4*  MG 2.0 1.9 1.8 1.8  --   --   --  1.9  --   PHOS 2.2* 2.3* 2.3* 1.8* 2.4* 2.6  --   --  3.1   Liver Function Tests:  Recent Labs Lab 04/19/17 0400 04/20/17 0348 04/21/17 0343 04/22/17 0317 04/23/17 0314  AST 38 40 35 36 28  ALT 54 54 48 52 39  ALKPHOS 62 79 76 105 85  BILITOT 0.5 0.6 0.4 0.7 0.5  PROT 5.6* 6.0* 6.0* 6.7 6.6  ALBUMIN 2.3* 2.3* 2.3* 2.6* 2.6*    CBG:  Recent Labs Lab 04/23/17 0314 04/23/17 0815 04/23/17 1026 04/23/17 1201 04/23/17 1553  GLUCAP 149* 179* 177* 213* 180*    Recent Results (from the past 240 hour(s))  Culture, blood (x 2)     Status: None   Collection Time: 04/14/17 11:00 PM  Result Value Ref Range Status   Specimen Description SITE NOT SPECIFIED  Final   Special  Requests Blood Culture adequate volume  Final   Culture NO GROWTH 5 DAYS  Final   Report Status 04/19/2017 FINAL  Final  Culture, blood (x 2)     Status: None   Collection Time: 04/14/17 11:06 PM  Result Value Ref Range Status   Specimen Description SITE NOT SPECIFIED  Final   Special Requests Blood Culture adequate volume  Final   Culture NO GROWTH 5 DAYS  Final   Report Status 04/19/2017 FINAL  Final  Culture, blood (Routine X 2) w Reflex to ID Panel     Status: None   Collection Time: 04/17/17 12:29 PM  Result Value Ref Range Status  Specimen Description BLOOD RIGHT HAND  Final   Special Requests Blood Culture adequate volume  Final   Culture NO GROWTH 5 DAYS  Final   Report Status 04/22/2017 FINAL  Final  Culture, blood (Routine X 2) w Reflex to ID Panel     Status: None   Collection Time: 04/17/17 12:29 PM  Result Value Ref Range Status   Specimen Description BLOOD PICC LINE DRAW  Final   Special Requests   Final    Blood Culture results may not be optimal due to an inadequate volume of blood received in culture bottles   Culture NO GROWTH 5 DAYS  Final   Report Status 04/22/2017 FINAL  Final      Radiology Studies: No results found.   Scheduled Meds: . ALPRAZolam  0.25 mg Oral Daily  . chlorhexidine  15 mL Mouth Rinse BID  . Chlorhexidine Gluconate Cloth  6 each Topical Daily  . cloNIDine  0.2 mg Oral BID  . feeding supplement (ENSURE ENLIVE)  237 mL Oral BID BM  . heparin  5,000 Units Subcutaneous Q8H  . HYDROmorphone   Intravenous Q4H  . insulin aspart  2-6 Units Subcutaneous Q4H  . lipase/protease/amylase  36,000 Units Oral TID BM  . lipase/protease/amylase  72,000 Units Oral TID WC  . magnesium chloride  1 tablet Oral q morning - 10a  . mouth rinse  15 mL Mouth Rinse q12n4p  . metoprolol tartrate  75 mg Oral BID  . multivitamin  15 mL Oral Daily  . pantoprazole (PROTONIX) IV  40 mg Intravenous Q12H  . sodium chloride flush  10-40 mL Intracatheter Q12H    Continuous Infusions: . feeding supplement (VITAL AF 1.2 CAL) 1,000 mL (04/23/17 1700)     LOS: 10 days   Verneita Griffes, MD [facility] (P) [phone]

## 2017-04-24 LAB — CBC
HCT: 25.8 % — ABNORMAL LOW (ref 36.0–46.0)
Hemoglobin: 8.3 g/dL — ABNORMAL LOW (ref 12.0–15.0)
MCH: 25.6 pg — ABNORMAL LOW (ref 26.0–34.0)
MCHC: 32.2 g/dL (ref 30.0–36.0)
MCV: 79.6 fL (ref 78.0–100.0)
Platelets: 666 10*3/uL — ABNORMAL HIGH (ref 150–400)
RBC: 3.24 MIL/uL — ABNORMAL LOW (ref 3.87–5.11)
RDW: 14 % (ref 11.5–15.5)
WBC: 18.7 10*3/uL — ABNORMAL HIGH (ref 4.0–10.5)

## 2017-04-24 LAB — COMPREHENSIVE METABOLIC PANEL
ALT: 30 U/L (ref 14–54)
AST: 23 U/L (ref 15–41)
Albumin: 2.8 g/dL — ABNORMAL LOW (ref 3.5–5.0)
Alkaline Phosphatase: 72 U/L (ref 38–126)
Anion gap: 9 (ref 5–15)
BUN: 12 mg/dL (ref 6–20)
CO2: 25 mmol/L (ref 22–32)
Calcium: 8.6 mg/dL — ABNORMAL LOW (ref 8.9–10.3)
Chloride: 104 mmol/L (ref 101–111)
Creatinine, Ser: 0.47 mg/dL (ref 0.44–1.00)
GFR calc Af Amer: >60 mL/min (ref >60)
GFR calc non Af Amer: >60 mL/min (ref >60)
Glucose, Bld: 148 mg/dL — ABNORMAL HIGH (ref 65–99)
Potassium: 3.9 mmol/L (ref 3.5–5.1)
Sodium: 138 mmol/L (ref 135–145)
Total Bilirubin: 0.5 mg/dL (ref 0.3–1.2)
Total Protein: 7 g/dL (ref 6.5–8.1)

## 2017-04-24 LAB — GLUCOSE, CAPILLARY
Glucose-Capillary: 125 mg/dL — ABNORMAL HIGH (ref 65–99)
Glucose-Capillary: 150 mg/dL — ABNORMAL HIGH (ref 65–99)
Glucose-Capillary: 169 mg/dL — ABNORMAL HIGH (ref 65–99)
Glucose-Capillary: 151 mg/dL — ABNORMAL HIGH (ref 65–99)
Glucose-Capillary: 159 mg/dL — ABNORMAL HIGH (ref 65–99)

## 2017-04-24 LAB — PHOSPHORUS: Phosphorus: 3.2 mg/dL (ref 2.5–4.6)

## 2017-04-24 MED ORDER — PHENAZOPYRIDINE HCL 100 MG PO TABS
100.0000 mg | ORAL_TABLET | Freq: Three times a day (TID) | ORAL | Status: AC
  Administered 2017-04-24 – 2017-04-25 (×6): 100 mg via ORAL
  Filled 2017-04-24 (×7): qty 1

## 2017-04-24 MED ORDER — OXYCODONE-ACETAMINOPHEN 5-325 MG PO TABS
2.0000 | ORAL_TABLET | ORAL | Status: DC | PRN
  Administered 2017-04-24 – 2017-04-25 (×3): 2 via ORAL
  Filled 2017-04-24 (×4): qty 2

## 2017-04-24 MED ORDER — OXYCODONE HCL ER 20 MG PO T12A
20.0000 mg | EXTENDED_RELEASE_TABLET | Freq: Two times a day (BID) | ORAL | Status: DC
  Administered 2017-04-24 – 2017-04-26 (×5): 20 mg via ORAL
  Filled 2017-04-24 (×5): qty 1

## 2017-04-24 NOTE — Progress Notes (Signed)
[place] for PCA Indication: pain control, pancreatitis  Vital Signs: Temp: 98.5 F (36.9 C) (07/06 0419) Temp Source: Oral (07/06 0419) BP: 136/86 (07/06 0600)   Estimated Creatinine Clearance: 113.2 mL/min (by C-G formula based on SCr of 0.47 mg/dL).  Medications:  Scheduled:  . ALPRAZolam  0.25 mg Oral Daily  . chlorhexidine  15 mL Mouth Rinse BID  . Chlorhexidine Gluconate Cloth  6 each Topical Daily  . cloNIDine  0.2 mg Oral BID  . feeding supplement (ENSURE ENLIVE)  237 mL Oral BID BM  . heparin  5,000 Units Subcutaneous Q8H  . lipase/protease/amylase  36,000 Units Oral TID BM  . lipase/protease/amylase  72,000 Units Oral TID WC  . magnesium chloride  1 tablet Oral q morning - 10a  . mouth rinse  15 mL Mouth Rinse q12n4p  . metoprolol tartrate  75 mg Oral BID  . multivitamin  15 mL Oral Daily  . oxyCODONE  20 mg Oral Q12H  . pantoprazole (PROTONIX) IV  40 mg Intravenous Q12H  . phenazopyridine  100 mg Oral TID WC  . sodium chloride flush  10-40 mL Intracatheter Q12H   Assessment: 36yo female with pancreatitis and pain difficult to control.  Full dose dilaudid PCA started 6/27, but lockout time was increased to 12 minutes on 6/29 for increased drowsiness.  She continues to have decreasing PCA use and well managed pain scores on PCA, but will need to transition to oral medications in the next 24-48 hours per MD.  PCA Doses used: 6/28 5.1 mg 6/29 6.3 mg 6/30: 25 mg 7/1: 14.4 mg 7/2 18.6 mg 7/3 18.1 mg 7/4 15.9 mg 7/5 7.8 mg  Today, 04/24/2017: Pain scores asleep, 2-6 RASS 0 EtCO2 monitoring  LBM reported on 7/4.    Information for change to equivalent PO medication:  Hydromorphone 7.8 mg IV (~ 24 hours dose) = Oxycodone 104 mg PO  ~ 50% dose as long acting Oxycontin 20 mg BID (total 40mg )  Oxycodone 10 mg PO q4h PRN (total 60mg  if all doses are used)  Pharmacy will sign off at this time.  Please reconsult if a  change in clinical status warrants re-evaluation of dosage.     Gretta Arab PharmD, BCPS Pager [phone] 04/24/2017 7:49 AM

## 2017-04-24 NOTE — Progress Notes (Signed)
Nutrition Follow-up  DOCUMENTATION CODES:   Obesity unspecified  INTERVENTION:  - Continue current TF regimen.  - Continue to assess PO intakes and willingness to consume PO nutrition. - Continue Ensure Enlive BID.  NUTRITION DIAGNOSIS:   Inadequate oral intake related to inability to eat, acute illness, nausea, vomiting, altered GI function as evidenced by meal completion < 50%. -ongoing  GOAL:   Patient will meet greater than or equal to 90% of their needs -met with TF regimen and minimal PO intakes.   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 Presented to ED with abdominal pain, nausea, vomiting. Worked up for acute pancreatitis w/o pseudocyst. Admitted for management.   7/6 Pt with Dobhoff in place and IR imaging note from 6/29 states that tube is at the duodenojejunal junction. Pt is receiving Vital AF 1.2 @ 60 mL/hr which is providing 1728 kcal, 108 grams of protein, and 1168 mL free water. 48 hour Calorie Count ordered yesterday; RD unable to locate any meal tickets in the room or in patient chart and pt reports that no one has been taking her meal tickets to track intakes. Pt states yesterday she ate and drank minimally (only bites and had sips throughout the day which included ~1/4 of Ensure Enlive [intake of supplement: ~88 kcal and 5 grams of protein]).   Breakfast tray in front of pt. She refuses to eat jello d/t dislike of taste and was about to try honey nut Cheerios with Lactaid milk as RD entered the room. Pt reports that since Foley has been out she has been refusing to eat or drink. She reports that eating leads to having a BM and that when she has a BM she also has to urinate; states that drinking leads to increased urination. Pt reports not wanting to get up to use the restroom/not wanting to have to void during the day. Because of  this, she has been refusing PO intakes. Pt reports that MD prescribed a pill and states "if this pill works a Oceanographer, which I am hoping for, I will eat and drink everything in sight without a problem." She states if pill "does not work a Oceanographer, then I am going to continue to not eat and drink." She also is very upset about PCA being discontinued.  Pt denies any nausea but states she has been experiencing intermittent abdominal cramping. Most recently, she experienced this this AM when she drank a full 8 ounces of juice to take medications; pt reports drinking this quickly. Suspect that cramping d/t choice of beverage given dx and speed of intake. Pt denies any issues with appetite. Poor intakes seem to be d/t voiding avoidance rather than discomfort with intakes or poor appetite. If pt willing to increase PO intakes, would recommend transition to nocturnal feedings.   Medications reviewed; 36000 units Creon TID between meals, 72000 units Creon TID with meals, 64 mg Slow-Mag/day, 15 mL liquid multivitamin/day, 40 mg IV Protonix BID,  Labs reviewed; CBGs: 125 and 150 mg/dL this AM, Ca: 8.6 mg/dL.    7/5 - Pt reports not eating well.  - Has breakfast tray at bedside and mainly only sipping on ginger ale.  - She states she was drinking fluids adequately until her foley catheter was removed.  - Pt with many excuses as to why her intake is poor.  - Pt has not  requested any protein supplements from unit as encouraged by RD in previous visit.  - Pt is open to trying them now, RD will place order.  - Pt seems eager to have NJT removed but isn't really trying to increase intake.  - Will continue TF at goal rate d/t pt's continued poor PO.  - The only solid foods the patient has attempted to try has been carbohydrates such as potatoes, rolls, sherbet, etc. - Will place order for calorie count to assess protein consumption.    Diet Order:  Diet Heart Room service appropriate? Yes; Fluid consistency:  Thin  Skin:  Reviewed, no issues  Last BM:  7/5  Height:   Ht Readings from Last 1 Encounters:  04/17/17 _0  (1.626 m)    Weight:   Wt Readings from Last 1 Encounters:  04/24/17 229 lb 11.5 oz (104.2 kg)    Ideal Body Weight:  52.27 kg  BMI:  Body mass index is 39.43 kg/m.  Estimated Nutritional Needs:   Kcal:  1650-1900 (32-36 kcal/kg ibw)  Protein:  90-105g (1.7-2 g/kg IBW)  Fluid:  Per MD  EDUCATION NEEDS:   No education needs identified at this time    Jarome Matin, MS, RD, LDN, CNSC Inpatient Clinical Dietitian Pager # [phone] After hours/weekend pager # [phone]

## 2017-04-24 NOTE — Progress Notes (Signed)
21 mg/21 ml  Hydromorphone (Dilaudid) PCA injection wasted in sink with Nelva Bush, RN

## 2017-04-24 NOTE — Progress Notes (Signed)
Physical Therapy Treatment Patient Details Name: Kaylee Montgomery MRN: [id number] DOB: 03/22/1981 Today's Date: 04/24/2017    History of Present Illness Kaylee Montgomery is a 36yo black female who comes to ED with severe LUQ pain, N/V. She is noted to have acute pancreatitis, has since been with tachycardia in 120-140s, and HTN. As of 6/26, CXR revealing pulmonary edema, vascular congestion, cardiomegaly, and basilar atelectasis. PMH: pituitary tumor, mirgraine, OAS c CPAP non-compliance. 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     PT Comments    Pt progressing well with mobility with continued improvement noted in speed, endurance and stability.   Follow Up Recommendations  Home health PT;Supervision for mobility/OOB     Equipment Recommendations  Rolling walker with 5" wheels    Recommendations for Other Services OT consult     Precautions / Restrictions Precautions Precautions: Fall Restrictions Weight Bearing Restrictions: No    Mobility  Bed Mobility               General bed mobility comments: NT - pt up in chair and returned to same  Transfers Overall transfer level: Needs assistance Equipment used: Rolling walker (2 wheeled) Transfers: Sit to/from Stand Sit to Stand: Min assist         General transfer comment: cues for use of UEs to self assist  Ambulation/Gait Ambulation/Gait assistance: Min assist Ambulation Distance (Feet): 1000 Feet Assistive device: Rolling walker (2 wheeled) Gait Pattern/deviations: Step-through pattern Gait velocity: decr Gait velocity interpretation: Below normal speed for age/gender General Gait Details: Min cues for posture and position from RW.     Stairs            Wheelchair Mobility    Modified Rankin (Stroke Patients Only)       Balance Overall balance assessment: Needs assistance Sitting-balance support: Feet supported;No upper  extremity supported Sitting balance-Leahy Scale: Good     Standing balance support: No upper extremity supported Standing balance-Leahy Scale: Fair                              Cognition Arousal/Alertness: Awake/alert Behavior During Therapy: WFL for tasks assessed/performed Overall Cognitive Status: Within Functional Limits for tasks assessed                                        Exercises      General Comments        Pertinent Vitals/Pain Pain Assessment: 0-10 Pain Score: 4  Pain Location: abdomen Pain Descriptors / Indicators: Sore Pain Intervention(s): Limited activity within patient's tolerance;Monitored during session;Premedicated before session    Home Living                      Prior Function            PT Goals (current goals can now be found in the care plan section) Acute Rehab PT Goals Patient Stated Goal: walk PT Goal Formulation: With patient Progress towards PT goals: Progressing toward goals    Frequency    Min 3X/week      PT Plan Current plan remains appropriate    Co-evaluation              AM-PAC PT "6 Clicks" Daily Activity  Outcome Measure  Difficulty turning over in  bed (including adjusting bedclothes, sheets and blankets)?: A Lot Difficulty moving from lying on back to sitting on the side of the bed? : Total Difficulty sitting down on and standing up from a chair with arms (e.g., wheelchair, bedside commode, etc,.)?: Total Help needed moving to and from a bed to chair (including a wheelchair)?: A Little Help needed walking in hospital room?: A Little Help needed climbing 3-5 steps with a railing? : A Lot 6 Click Score: 12    End of Session   Activity Tolerance: Patient tolerated treatment well Patient left: in chair;with call bell/phone within reach;with family/visitor present Nurse Communication: Mobility status PT Visit Diagnosis: Muscle weakness (generalized)  (M62.81);Difficulty in walking, not elsewhere classified (R26.2)     Time: 1130-1157 PT Time Calculation (min) (ACUTE ONLY): 27 min  Charges:  $Gait Training: 23-37 mins                    G Codes:       Pg [phone]    Rolonda Pontarelli 04/24/2017, 1:07 PM

## 2017-04-24 NOTE — Progress Notes (Signed)
PROGRESS NOTE   Kaylee Montgomery  BJS:[id number]    DOB: 04-Mar-1981    DOA: 04/12/2017  PCP: [name], MD   I have briefly reviewed patients previous medical records in [facility].  Brief Narrative:   35  HTN,  Migraine, pituitary adenoma, OSA noncompliant with CPAP no history of alcohol abuse  initially admitted 6.25.18 to [facility] acute pancreatitis (elevated LFTs, lipase 6000 range, CT and ultrasound abdomen confirming same). Subsequently worsened, ARDS suspected, clinical concern for decompensation/need for intubation, no GI coverage at [facility] on the weekend, hence transferred to [facility] ICU 6.27 CCM evaluated and felt patient appropriate under hospitalist care.  [facility] consulted. CCM signed off. Slowly improving but expect a slow course. Remains in stepdown unit.   Assessment & Plan:   Principal Problem:   Acute pancreatitis Active Problems:   PITUITARY ADENOMA   Morbid obesity (HCC)   Seasonal and perennial allergic rhinitis   Prediabetes   Obstructive sleep apnea   S/P laparoscopic sleeve gastrectomy   Transaminitis   Anemia   Abdominal pain  SIRS without infectious etiology Acute necrotizing pancreatitis--Cryptogenic No medications to suggest etiology. No prior gallstones. No family history of pancreatitis.  Proximal splenic vein thrombosis--?could leukocytosis be 2/2 to micro splenic infarcts THIS PATIENT IS NOT SEPTIC--SIRS IS FROM PANCREATITIS-NO FURTHER CLARIFICATION ? gallstone pancreatitis from sludge/microlithiasis alcohol abuse and no use since April 2018. triglycerides on admission wnl.  [facility] Continue postpyloric tube feeding-anticipate home health with feeds Per gastroenterologist-pseudocyst can probably compress stomach, this may take weeks to resolve in terms of tolerance of full diet received 7 days Zosyn, meropenem ending on 7/2 secondary to sirs Dilaudid PCA for pain management-better  controlled. Slowly improving.  Leukocytosis 20 range- Blood cultures 2 each from 6/26 and 6/29:  NGTD. Chest x-ray 6/30: No acute disease. Incentive spirometry.  No anticoagulation for splenic thrombosis Creon tid Continue Lovenox for DVT prophylaxis.  7/6 PCA-transitioned to OxyContin 20 twice a day, Percocet 10 every 4 when necessary Painful urination Had a foley d/c 7/5  UA 7/6 neg, pyridium 100 tid x 2 days then STOP Abnormal LFTs: Hepatitis panel negative. Per GI. Resolved. Acute respiratory failure with hypoxia ohss HABITUS/ OSA:  Related to SIRS, atelectasis, untreated OSA. Incentive spirometry.  Supplemental oxygen as needed.  Patient is unwilling to use CPAP Essential hypertension:   Worsened by acute illness.  Currently on metoprolol 50 MG twice a day, increase to 75 MG twice a day.   Continue when necessary IV labetalol.  Added clonidine 0.2 bid [might also help with anxiety] Electrolytes (hypokalemia, hypomagnesemia, hypophosphatemia):   Replace and follow as needed. Discussed with pharmacy. Anemia:   Secondary to critical illness. Stable. Follow CBCs. History of sleeve gastrectomy: Morbid obesity/Body mass index is 39.43 kg/m. Leukocytosis: Secondary to acute pancreatitis.   resolving    DVT prophylaxis: Heparin Code Status: Full Family Communication: Discussed with father briefly 7/5 and 7/6 Disposition: Transfer to telemetry 7/6   Consultants:   PCCM-signed off 7/2 [facility] Interventional radiology -to place NG tube and right upper extremity PICC line  Procedures:  Postpyloric NG tube placed on PICC line  Antimicrobials:  Zosyn-discontinued. IV meropenem 6/30 > 7/3   Subjective: Awake alert doing well still having some discomfort in her genitals and while passing urine No further hematuria Eating some but not to goal Passing some stool which is loose Overall looks improved   Objective:  Vitals:   04/24/17 0347  04/24/17 0400 04/24/17 0401  04/24/17 0419  BP:  123/71    Pulse:      Resp:  (!) 6 11   Temp:    98.5 F (36.9 C)  TempSrc:    Oral  SpO2:  (!) 84% 94%   Weight: 104.2 kg (229 lb 11.5 oz)     Height:        Examination:  Pleasant obese but a little anxious Chest is soft clear without any added sound, no rails no rhonchi Abdomen is nontender and listed below Patient still has Dobbhoff tube in Feeds are going at scheduled rate No lower extremity edema Neurological is intact   Data Reviewed: I have personally reviewed following labs and imaging studies  CBC:  Recent Labs Lab 04/18/17 1047  04/20/17 0348 04/21/17 0343 04/22/17 0317 04/23/17 0314 04/24/17 0447  WBC 16.8*  < > 23.9* 22.6* 24.3* 24.0* 18.7*  NEUTROABS 13.1*  --   --   --   --   --   --   HGB 8.9*  < > 8.6* 8.3* 8.5* 8.5* 8.3*  HCT 27.9*  < > 26.4* 25.7* 26.7* 26.4* 25.8*  MCV 80.6  < > 80.0 79.3 79.2 79.3 79.6  PLT 213  < > 360 380 486* 620* 666*  < > = values in this interval not displayed. Basic Metabolic Panel:  Recent Labs Lab 04/18/17 1047 04/18/17 1700 04/19/17 0400 04/20/17 0348 04/21/17 0343 04/22/17 0300 04/22/17 0317 04/22/17 0330 04/23/17 0314 04/24/17 0447  NA 140  --  140 139 139  --  137  --  133* 138  K 3.2*  --  3.5 3.3* 3.4*  --  4.0  --  4.0 3.9  CL 101  --  102 103 103  --  102  --  99* 104  CO2 31  --  30 29 30   --  29  --  25 25  GLUCOSE 197*  --  131* 137* 145*  --  170*  --  164* 148*  BUN 7  --  6 8 8   --  9  --  11 12  CREATININE 0.57  --  0.53 0.54 0.43*  --  0.42*  --  0.43* 0.47  CALCIUM 7.6*  --  7.9* 8.0* 8.1*  --  8.3*  --  8.4* 8.6*  MG 2.0 1.9 1.8 1.8  --   --   --  1.9  --   --   PHOS 2.2* 2.3* 2.3* 1.8* 2.4* 2.6  --   --  3.1 3.2   Liver Function Tests:  Recent Labs Lab 04/20/17 0348 04/21/17 0343 04/22/17 0317 04/23/17 0314 04/24/17 0447  AST 40 35 36 28 23  ALT 54 48 52 39 30  ALKPHOS 79 76 105 85 72  BILITOT 0.6 0.4 0.7 0.5 0.5  PROT 6.0* 6.0* 6.7 6.6 7.0  ALBUMIN  2.3* 2.3* 2.6* 2.6* 2.8*    CBG:  Recent Labs Lab 04/23/17 1201 04/23/17 1553 04/23/17 1947 04/23/17 2319 04/24/17 0330  GLUCAP 213* 180* 129* 161* 125*    Recent Results (from the past 240 hour(s))  Culture, blood (x 2)     Status: None   Collection Time: 04/14/17 11:00 PM  Result Value Ref Range Status   Specimen Description SITE NOT SPECIFIED  Final   Special Requests Blood Culture adequate volume  Final   Culture NO GROWTH 5 DAYS  Final   Report Status 04/19/2017 FINAL  Final  Culture, blood (  x 2)     Status: None   Collection Time: 04/14/17 11:06 PM  Result Value Ref Range Status   Specimen Description SITE NOT SPECIFIED  Final   Special Requests Blood Culture adequate volume  Final   Culture NO GROWTH 5 DAYS  Final   Report Status 04/19/2017 FINAL  Final  Culture, blood (Routine X 2) w Reflex to ID Panel     Status: None   Collection Time: 04/17/17 12:29 PM  Result Value Ref Range Status   Specimen Description BLOOD RIGHT HAND  Final   Special Requests Blood Culture adequate volume  Final   Culture NO GROWTH 5 DAYS  Final   Report Status 04/22/2017 FINAL  Final  Culture, blood (Routine X 2) w Reflex to ID Panel     Status: None   Collection Time: 04/17/17 12:29 PM  Result Value Ref Range Status   Specimen Description BLOOD PICC LINE DRAW  Final   Special Requests   Final    Blood Culture results may not be optimal due to an inadequate volume of blood received in culture bottles   Culture NO GROWTH 5 DAYS  Final   Report Status 04/22/2017 FINAL  Final      Radiology Studies: No results found.   Scheduled Meds: . ALPRAZolam  0.25 mg Oral Daily  . chlorhexidine  15 mL Mouth Rinse BID  . Chlorhexidine Gluconate Cloth  6 each Topical Daily  . cloNIDine  0.2 mg Oral BID  . feeding supplement (ENSURE ENLIVE)  237 mL Oral BID BM  . heparin  5,000 Units Subcutaneous Q8H  . lipase/protease/amylase  36,000 Units Oral TID BM  . lipase/protease/amylase  72,000  Units Oral TID WC  . magnesium chloride  1 tablet Oral q morning - 10a  . mouth rinse  15 mL Mouth Rinse q12n4p  . metoprolol tartrate  75 mg Oral BID  . multivitamin  15 mL Oral Daily  . oxyCODONE  20 mg Oral Q12H  . pantoprazole (PROTONIX) IV  40 mg Intravenous Q12H  . phenazopyridine  100 mg Oral TID WC  . sodium chloride flush  10-40 mL Intracatheter Q12H   Continuous Infusions: . feeding supplement (VITAL AF 1.2 CAL) 1,000 mL (04/24/17 0400)     LOS: 11 days   Verneita Griffes, MD [facility] (P) [phone]

## 2017-04-25 ENCOUNTER — Inpatient Hospital Stay (HOSPITAL_COMMUNITY): Payer: 59

## 2017-04-25 LAB — CBC
HCT: 26.1 % — ABNORMAL LOW (ref 36.0–46.0)
Hemoglobin: 8.4 g/dL — ABNORMAL LOW (ref 12.0–15.0)
MCH: 25.4 pg — ABNORMAL LOW (ref 26.0–34.0)
MCHC: 32.2 g/dL (ref 30.0–36.0)
MCV: 78.9 fL (ref 78.0–100.0)
Platelets: 786 10*3/uL — ABNORMAL HIGH (ref 150–400)
RBC: 3.31 MIL/uL — ABNORMAL LOW (ref 3.87–5.11)
RDW: 14 % (ref 11.5–15.5)
WBC: 15.8 10*3/uL — ABNORMAL HIGH (ref 4.0–10.5)

## 2017-04-25 LAB — COMPREHENSIVE METABOLIC PANEL
ALT: 26 U/L (ref 14–54)
AST: 20 U/L (ref 15–41)
Albumin: 2.8 g/dL — ABNORMAL LOW (ref 3.5–5.0)
Alkaline Phosphatase: 64 U/L (ref 38–126)
Anion gap: 9 (ref 5–15)
BUN: 12 mg/dL (ref 6–20)
CO2: 24 mmol/L (ref 22–32)
Calcium: 8.7 mg/dL — ABNORMAL LOW (ref 8.9–10.3)
Chloride: 103 mmol/L (ref 101–111)
Creatinine, Ser: 0.48 mg/dL (ref 0.44–1.00)
GFR calc Af Amer: >60 mL/min (ref >60)
GFR calc non Af Amer: >60 mL/min (ref >60)
Glucose, Bld: 155 mg/dL — ABNORMAL HIGH (ref 65–99)
Potassium: 3.9 mmol/L (ref 3.5–5.1)
Sodium: 136 mmol/L (ref 135–145)
Total Bilirubin: 0.5 mg/dL (ref 0.3–1.2)
Total Protein: 6.9 g/dL (ref 6.5–8.1)

## 2017-04-25 LAB — GLUCOSE, CAPILLARY
Glucose-Capillary: 145 mg/dL — ABNORMAL HIGH (ref 65–99)
Glucose-Capillary: 156 mg/dL — ABNORMAL HIGH (ref 65–99)
Glucose-Capillary: 189 mg/dL — ABNORMAL HIGH (ref 65–99)
Glucose-Capillary: 131 mg/dL — ABNORMAL HIGH (ref 65–99)
Glucose-Capillary: 151 mg/dL — ABNORMAL HIGH (ref 65–99)

## 2017-04-25 MED ORDER — SODIUM CHLORIDE 0.9% FLUSH: 10.0000 mL | INTRAVENOUS | Status: DC | PRN

## 2017-04-25 MED ORDER — PANTOPRAZOLE SODIUM 40 MG PO TBEC
40.0000 mg | DELAYED_RELEASE_TABLET | Freq: Two times a day (BID) | ORAL | Status: DC
  Administered 2017-04-25 – 2017-04-29 (×7): 40 mg via ORAL
  Filled 2017-04-25 (×7): qty 1

## 2017-04-25 MED ORDER — ADULT MULTIVITAMIN W/MINERALS CH
1.0000 | ORAL_TABLET | Freq: Every day | ORAL | Status: DC
  Administered 2017-04-25 – 2017-04-29 (×5): 1 via ORAL
  Filled 2017-04-25 (×5): qty 1

## 2017-04-25 MED ORDER — IOPAMIDOL (ISOVUE-300) INJECTION 61%
100.0000 mL | Freq: Once | INTRAVENOUS | Status: AC | PRN
  Administered 2017-04-25: 100 mL via INTRAVENOUS

## 2017-04-25 NOTE — Progress Notes (Signed)
Physical Therapy Treatment Patient Details Name: Kaylee Montgomery MRN: [id number] DOB: 1981-09-29 Today's Date: 04/25/2017    History of Present Illness Kaylee Montgomery is a 36yo black female who comes to ED with severe LUQ pain, N/V. She is noted to have acute pancreatitis, has since been with tachycardia in 120-140s, and HTN. As of 6/26, CXR revealing pulmonary edema, vascular congestion, cardiomegaly, and basilar atelectasis. PMH: pituitary tumor, mirgraine, OAS c CPAP non-compliance. 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     PT Comments    Pt continues motivated and progressing steadily with mobility.  Progressed to ambulation sans AD this am.   Follow Up Recommendations  Home health PT;Supervision for mobility/OOB     Equipment Recommendations  Rolling walker with 5" wheels    Recommendations for Other Services OT consult     Precautions / Restrictions Precautions Precautions: Fall Restrictions Weight Bearing Restrictions: No    Mobility  Bed Mobility Overal bed mobility: Modified Independent Bed Mobility: Sit to Supine       Sit to supine: Modified independent (Device/Increase time)   General bed mobility comments: Pt unassisted into bed  Transfers Overall transfer level: Needs assistance Equipment used: Rolling walker (2 wheeled) Transfers: Sit to/from Stand Sit to Stand: Min guard            Ambulation/Gait Ambulation/Gait assistance: Min assist;Min guard Ambulation Distance (Feet): 1000 Feet Assistive device: None Gait Pattern/deviations: Step-through pattern Gait velocity: decr Gait velocity interpretation: Below normal speed for age/gender General Gait Details: Increased BOS with mild intermittent instability but no LOB   Stairs            Wheelchair Mobility    Modified Rankin (Stroke Patients Only)       Balance Overall balance assessment: Needs  assistance Sitting-balance support: Feet supported;No upper extremity supported Sitting balance-Leahy Scale: Good     Standing balance support: No upper extremity supported Standing balance-Leahy Scale: Fair                              Cognition Arousal/Alertness: Awake/alert Behavior During Therapy: WFL for tasks assessed/performed Overall Cognitive Status: Within Functional Limits for tasks assessed                                        Exercises      General Comments        Pertinent Vitals/Pain Pain Assessment: 0-10 Pain Score: 2  Pain Location: abdomen Pain Descriptors / Indicators: Sore Pain Intervention(s): Limited activity within patient's tolerance;Monitored during session;Premedicated before session    Home Living                      Prior Function            PT Goals (current goals can now be found in the care plan section) Acute Rehab PT Goals Patient Stated Goal: Regain IND PT Goal Formulation: With patient Progress towards PT goals: Progressing toward goals    Frequency    Min 3X/week      PT Plan Current plan remains appropriate    Co-evaluation              AM-PAC PT "6 Clicks" Daily Activity  Outcome Measure  Difficulty turning over in bed (including adjusting bedclothes,  sheets and blankets)?: None Difficulty moving from lying on back to sitting on the side of the bed? : A Little Difficulty sitting down on and standing up from a chair with arms (e.g., wheelchair, bedside commode, etc,.)?: A Little Help needed moving to and from a bed to chair (including a wheelchair)?: A Little Help needed walking in hospital room?: A Little Help needed climbing 3-5 steps with a railing? : A Little 6 Click Score: 19    End of Session   Activity Tolerance: Patient tolerated treatment well Patient left: in bed;with call bell/phone within reach;with family/visitor present Nurse Communication: Mobility  status PT Visit Diagnosis: Muscle weakness (generalized) (M62.81);Difficulty in walking, not elsewhere classified (R26.2)     Time: 1610-9604 PT Time Calculation (min) (ACUTE ONLY): 16 min  Charges:  $Gait Training: 8-22 mins                    G Codes:       Pg [phone]    Jonette Wassel 04/25/2017, 12:41 PM

## 2017-04-25 NOTE — Progress Notes (Signed)
Pt called RN into room to inform that she has used the bathroom at least every hour during the night and that she has increased urgency to void. Mother, who is at patient's bedside, has been emptying the urine hat without informing RN or NT of measurements of volume of urine despite education that measurements need to be obtained. This RN educated patient and mother about importance of informing staff when urinating so proper measurements can be taken. Will make MD aware of increased urgency. Will continue to monitor pt closely. Carnella Guadalajara I

## 2017-04-25 NOTE — Progress Notes (Signed)
[facility] Progress Note  Subjective: Patient with moderate improving abdominal pain, not eating much around and a tube. Reminds me that she has poor appetite since her gastric sleeve. Has some interest in taking the tube out however.  Objective: Vital signs in last 24 hours: Temp:  [98.1 F (36.7 C)-98.7 F (37.1 C)] 98.7 F (37.1 C) (07/07 0609) Pulse Rate:  [96-109] 109 (07/07 0909) Resp:  [12-27] 18 (07/07 0609) BP: (117-152)/(67-116) 133/97 (07/07 0909) SpO2:  [76 %-100 %] 97 % (07/07 0609) Weight:  [101.5 kg (223 lb 11.2 oz)] 101.5 kg (223 lb 11.2 oz) (07/07 0609) Weight change: -2.73 kg (-6 lb 0.3 oz)   PE: Abdomen soft moderately tender  Lab Results: Results for orders placed or performed during the hospital encounter of 04/12/17 (from the past 24 hour(s))  Glucose, capillary     Status: Abnormal   Collection Time: 04/24/17 12:43 PM  Result Value Ref Range   Glucose-Capillary 169 (H) 65 - 99 mg/dL   Comment 1 Notify RN    Comment 2 Document in Chart   Glucose, capillary     Status: Abnormal   Collection Time: 04/24/17  4:49 PM  Result Value Ref Range   Glucose-Capillary 151 (H) 65 - 99 mg/dL   Comment 1 Notify RN    Comment 2 Document in Chart   Glucose, capillary     Status: Abnormal   Collection Time: 04/24/17  7:35 PM  Result Value Ref Range   Glucose-Capillary 159 (H) 65 - 99 mg/dL  Comprehensive metabolic panel     Status: Abnormal   Collection Time: 04/25/17  5:00 AM  Result Value Ref Range   Sodium 136 135 - 145 mmol/L   Potassium 3.9 3.5 - 5.1 mmol/L   Chloride 103 101 - 111 mmol/L   CO2 24 22 - 32 mmol/L   Glucose, Bld 155 (H) 65 - 99 mg/dL   BUN 12 6 - 20 mg/dL   Creatinine, Ser 0.48 0.44 - 1.00 mg/dL   Calcium 8.7 (L) 8.9 - 10.3 mg/dL   Total Protein 6.9 6.5 - 8.1 g/dL   Albumin 2.8 (L) 3.5 - 5.0 g/dL   AST 20 15 - 41 U/L   ALT 26 14 - 54 U/L   Alkaline Phosphatase 64 38 - 126 U/L   Total Bilirubin 0.5 0.3 - 1.2 mg/dL   GFR calc  non Af Amer >60 >60 mL/min   GFR calc Af Amer >60 >60 mL/min   Anion gap 9 5 - 15  CBC     Status: Abnormal   Collection Time: 04/25/17  5:00 AM  Result Value Ref Range   WBC 15.8 (H) 4.0 - 10.5 K/uL   RBC 3.31 (L) 3.87 - 5.11 MIL/uL   Hemoglobin 8.4 (L) 12.0 - 15.0 g/dL   HCT 26.1 (L) 36.0 - 46.0 %   MCV 78.9 78.0 - 100.0 fL   MCH 25.4 (L) 26.0 - 34.0 pg   MCHC 32.2 30.0 - 36.0 g/dL   RDW 14.0 11.5 - 15.5 %   Platelets 786 (H) 150 - 400 K/uL  Glucose, capillary     Status: Abnormal   Collection Time: 04/25/17  6:16 AM  Result Value Ref Range   Glucose-Capillary 145 (H) 65 - 99 mg/dL  Glucose, capillary     Status: Abnormal   Collection Time: 04/25/17  7:37 AM  Result Value Ref Range   Glucose-Capillary 156 (H) 65 - 99 mg/dL    Studies/Results: No results  found.    Assessment: Pancreatitis, etiology not apparent, with significant necrosis by CT scan on June 29', status post 1 week treatment of antibiotics with enteral nutrition, antibiotic now discontinued.  Plan: Will repeat CT to follow-up June 29 scan to look for pseudocyst, abscess or worsening necrosis. Will defer decision regarding discontinuing enteral feeds until then. Jannette Cotham C 04/25/2017, 10:52 AM  Pager [phone] If no answer or after 5 PM call [phone]

## 2017-04-25 NOTE — Progress Notes (Signed)
PROGRESS NOTE   Kaylee Montgomery  TGG:[id number]    DOB: [birth year]    DOA: 04/12/2017  PCP: [name], MD   I have briefly reviewed patients previous medical records in [facility].  Brief Narrative:   35  HTN,  Migraine, pituitary adenoma, OSA noncompliant with CPAP no history of alcohol abuse  initially admitted 6.25.18 to [facility] acute pancreatitis (elevated LFTs, lipase 6000 range, CT and ultrasound abdomen confirming same). Subsequently worsened, ARDS suspected, clinical concern for decompensation/need for intubation, no GI coverage at [facility] on the weekend, hence transferred to [facility] ICU 6.27 CCM evaluated and felt patient appropriate under hospitalist care.  [facility] consulted. CCM signed off. Slowly improving but expect a slow course. Remains in stepdown unit.   Assessment & Plan:   Principal Problem:   Acute pancreatitis Active Problems:   PITUITARY ADENOMA   Morbid obesity (HCC)   Seasonal and perennial allergic rhinitis   Prediabetes   Obstructive sleep apnea   S/P laparoscopic sleeve gastrectomy   Transaminitis   Anemia   Abdominal pain  SIRS without infectious etiology Acute necrotizing pancreatitis--Cryptogenic No medications to suggest etiology. No prior gallstones. No family history of pancreatitis.  Proximal splenic vein thrombosis--?could leukocytosis be 2/2 to micro splenic infarcts ? gallstone pancreatitis from sludge/microlithiasis alcohol abuse and no use since April 2018. triglycerides on admission wnl.  [facility] Continue postpyloric tube feeding-anticipate home health with feeds Per gastroenterologist-pseudocyst can probably compress stomach, this may take weeks to resolve in terms of tolerance of full diet received 7 days Zosyn, meropenem ending on 7/2 secondary to sirs Dilaudid PCA for pain management-better controlled. Slowly improving.  Leukocytosis 20 range- Blood cultures 2 each from  6/26 and 6/29:  NGTD. Chest x-ray 6/30: No acute disease. Incentive spirometry.  No anticoagulation for splenic thrombosis Creon tid Continue Lovenox for DVT prophylaxis.  7/6 PCA-transitioned to OxyContin 20 twice a day, Percocet 10 every 4 when necessary--we will escalate down from 10 mg to 5-10 mg every 4 when necessary  Expect she will do very well going forward Painful urination without signs and symptoms of urinary infection Leukocytosis: Secondary to acute pancreatitis.   Resolving  Had a foley d/c 7/5   UA 7/6 neg, pyridium 100 tid x 2 days then STOP--her dysuria is improving with Pyridium  White count continues to drop and she is nontoxic-appearing-no antibiotics required  currently Abnormal LFTs: Hepatitis panel negative. Per GI. Resolved. Acute respiratory failure with hypoxia ohss HABITUS/ OSA:  Related to SIRS, atelectasis, untreated OSA. Incentive spirometry.  Supplemental oxygen as needed.  Patient is unwilling to use CPAP Essential hypertension:   Worsened by acute illness.  Currently on metoprolol 50 MG twice a day, increase to 75 MG twice a day.   Continue when necessary IV labetalol.  Added clonidine 0.2 bid [might also help with anxiety]--blood pressure seems to be stable and relatively well-controlled and her tachycardia is likely secondary to anxiety and is improved Electrolytes (hypokalemia, hypomagnesemia, hypophosphatemia):   Replace and follow as needed. Have stopped checking for micronutrients as patient is stabilizing Anemia Secondary to critical illness  Stable. Follow CBCs from a distance History of sleeve gastrectomy: Morbid obesity/Body mass index is 38.4 kg/m.   Stable at present time and is eating small amounts for her meals     DVT prophylaxis: Heparin Code Status: Full Family Communication: Discussed with father briefly 7/5 and 7/6, discussed with mother outside 04/25/2017 Disposition: Transfer to telemetry  7/6   Consultants:    PCCM-signed off 7/2 [facility] Interventional radiology -to place NG tube and right upper extremity PICC line  Procedures:  Postpyloric NG tube placed on PICC line  Antimicrobials:  Zosyn-discontinued. IV meropenem 6/30 > 7/3   Subjective:  some GU discomfort + frequency Rates discomfort 5/10 abd discomfort ~ 3/10 Tube becoming dioslodged and now fixed Walking around some in room No cp    Objective:  Vitals:   04/24/17 2203 04/24/17 2255 04/25/17 0609 04/25/17 0909  BP: (!) 137/116 (!) 129/91 (!) 127/97 (!) 133/97  Pulse:   (!) 106 (!) 109  Resp:   18   Temp:   98.7 F (37.1 C)   TempSrc:   Oral   SpO2:   97%   Weight:   101.5 kg (223 lb 11.2 oz)   Height:        Examination:  eomi  Pleasant Dobhoff unattached Looks well chest clear Epigastrium non tender non distended No rebound No le edema Neuro moves all 4 limbs Psych is euthymic   Data Reviewed: I have personally reviewed following labs and imaging studies  CBC:  Recent Labs Lab 04/18/17 1047  04/21/17 0343 04/22/17 0317 04/23/17 0314 04/24/17 0447 04/25/17 0500  WBC 16.8*  < > 22.6* 24.3* 24.0* 18.7* 15.8*  NEUTROABS 13.1*  --   --   --   --   --   --   HGB 8.9*  < > 8.3* 8.5* 8.5* 8.3* 8.4*  HCT 27.9*  < > 25.7* 26.7* 26.4* 25.8* 26.1*  MCV 80.6  < > 79.3 79.2 79.3 79.6 78.9  PLT 213  < > 380 486* 620* 666* 786*  < > = values in this interval not displayed. Basic Metabolic Panel:  Recent Labs Lab 04/18/17 1047 04/18/17 1700 04/19/17 0400 04/20/17 0348 04/21/17 0343 04/22/17 0300 04/22/17 0317 04/22/17 0330 04/23/17 0314 04/24/17 0447 04/25/17 0500  NA 140  --  140 139 139  --  137  --  133* 138 136  K 3.2*  --  3.5 3.3* 3.4*  --  4.0  --  4.0 3.9 3.9  CL 101  --  102 103 103  --  102  --  99* 104 103  CO2 31  --  30 29 30   --  29  --  25 25 24   GLUCOSE 197*  --  131* 137* 145*  --  170*  --  164* 148* 155*  BUN 7  --  6 8 8   --  9  --  11 12 12   CREATININE 0.57  --   0.53 0.54 0.43*  --  0.42*  --  0.43* 0.47 0.48  CALCIUM 7.6*  --  7.9* 8.0* 8.1*  --  8.3*  --  8.4* 8.6* 8.7*  MG 2.0 1.9 1.8 1.8  --   --   --  1.9  --   --   --   PHOS 2.2* 2.3* 2.3* 1.8* 2.4* 2.6  --   --  3.1 3.2  --    Liver Function Tests:  Recent Labs Lab 04/21/17 0343 04/22/17 0317 04/23/17 0314 04/24/17 0447 04/25/17 0500  AST 35 36 28 23 20   ALT 48 52 39 30 26  ALKPHOS 76 105 85 72 64  BILITOT 0.4 0.7 0.5 0.5 0.5  PROT 6.0* 6.7 6.6 7.0 6.9  ALBUMIN 2.3* 2.6* 2.6* 2.8* 2.8*    CBG:  Recent Labs Lab 04/24/17 1243 04/24/17 1649 04/24/17  1935 04/25/17 0616 04/25/17 0737  GLUCAP 169* 151* 159* 145* 156*    Recent Results (from the past 240 hour(s))  Culture, blood (Routine X 2) w Reflex to ID Panel     Status: None   Collection Time: 04/17/17 12:29 PM  Result Value Ref Range Status   Specimen Description BLOOD RIGHT HAND  Final   Special Requests Blood Culture adequate volume  Final   Culture NO GROWTH 5 DAYS  Final   Report Status 04/22/2017 FINAL  Final  Culture, blood (Routine X 2) w Reflex to ID Panel     Status: None   Collection Time: 04/17/17 12:29 PM  Result Value Ref Range Status   Specimen Description BLOOD PICC LINE DRAW  Final   Special Requests   Final    Blood Culture results may not be optimal due to an inadequate volume of blood received in culture bottles   Culture NO GROWTH 5 DAYS  Final   Report Status 04/22/2017 FINAL  Final      Radiology Studies: No results found.   Scheduled Meds: . ALPRAZolam  0.25 mg Oral Daily  . chlorhexidine  15 mL Mouth Rinse BID  . Chlorhexidine Gluconate Cloth  6 each Topical Daily  . cloNIDine  0.2 mg Oral BID  . feeding supplement (ENSURE ENLIVE)  237 mL Oral BID BM  . heparin  5,000 Units Subcutaneous Q8H  . lipase/protease/amylase  36,000 Units Oral TID BM  . lipase/protease/amylase  72,000 Units Oral TID WC  . magnesium chloride  1 tablet Oral q morning - 10a  . mouth rinse  15 mL Mouth  Rinse q12n4p  . metoprolol tartrate  75 mg Oral BID  . multivitamin with minerals  1 tablet Oral Daily  . oxyCODONE  20 mg Oral Q12H  . pantoprazole (PROTONIX) IV  40 mg Intravenous Q12H  . phenazopyridine  100 mg Oral TID WC  . sodium chloride flush  10-40 mL Intracatheter Q12H   Continuous Infusions: . feeding supplement (VITAL AF 1.2 CAL) 1,000 mL (04/25/17 0919)     LOS: 12 days   Verneita Griffes, MD [facility] (P) [phone]

## 2017-04-25 NOTE — Progress Notes (Signed)
Pt continues to refuse CPAP QHS.  RT to monitor and assess as needed.  

## 2017-04-26 LAB — COMPREHENSIVE METABOLIC PANEL
ALT: 25 U/L (ref 14–54)
AST: 21 U/L (ref 15–41)
Albumin: 3.2 g/dL — ABNORMAL LOW (ref 3.5–5.0)
Alkaline Phosphatase: 65 U/L (ref 38–126)
Anion gap: 7 (ref 5–15)
BUN: 10 mg/dL (ref 6–20)
CO2: 27 mmol/L (ref 22–32)
Calcium: 9 mg/dL (ref 8.9–10.3)
Chloride: 103 mmol/L (ref 101–111)
Creatinine, Ser: 0.46 mg/dL (ref 0.44–1.00)
GFR calc Af Amer: >60 mL/min (ref >60)
GFR calc non Af Amer: >60 mL/min (ref >60)
Glucose, Bld: 160 mg/dL — ABNORMAL HIGH (ref 65–99)
Potassium: 3.9 mmol/L (ref 3.5–5.1)
Sodium: 137 mmol/L (ref 135–145)
Total Bilirubin: 0.4 mg/dL (ref 0.3–1.2)
Total Protein: 7.4 g/dL (ref 6.5–8.1)

## 2017-04-26 LAB — GLUCOSE, CAPILLARY
Glucose-Capillary: 160 mg/dL — ABNORMAL HIGH (ref 65–99)
Glucose-Capillary: 148 mg/dL — ABNORMAL HIGH (ref 65–99)
Glucose-Capillary: 154 mg/dL — ABNORMAL HIGH (ref 65–99)
Glucose-Capillary: 219 mg/dL — ABNORMAL HIGH (ref 65–99)
Glucose-Capillary: 128 mg/dL — ABNORMAL HIGH (ref 65–99)
Glucose-Capillary: 154 mg/dL — ABNORMAL HIGH (ref 65–99)

## 2017-04-26 LAB — CBC
HCT: 27.9 % — ABNORMAL LOW (ref 36.0–46.0)
Hemoglobin: 8.9 g/dL — ABNORMAL LOW (ref 12.0–15.0)
MCH: 25.3 pg — ABNORMAL LOW (ref 26.0–34.0)
MCHC: 31.9 g/dL (ref 30.0–36.0)
MCV: 79.3 fL (ref 78.0–100.0)
Platelets: 893 10*3/uL — ABNORMAL HIGH (ref 150–400)
RBC: 3.52 MIL/uL — ABNORMAL LOW (ref 3.87–5.11)
RDW: 13.9 % (ref 11.5–15.5)
WBC: 14.9 10*3/uL — ABNORMAL HIGH (ref 4.0–10.5)

## 2017-04-26 LAB — LIPASE, BLOOD: Lipase: 67 U/L — ABNORMAL HIGH (ref 11–51)

## 2017-04-26 NOTE — Progress Notes (Signed)
Calorie Count Note  Calorie count ordered. 7/7 results below.  Diet: Heart Healthy Supplements: Ensure Enlive po BID, each supplement provides 350 kcal and 20 grams of protein  7/7: Breakfast: 160 kcal, 5g protein Lunch: 25 kcal, 1g protein Dinner: - Supplements: 1 Ensure, 350 kcal, 20g protein  Estimated Nutritional Needs:  Kcal:  1650-1900 (32-36 kcal/kg ibw) Protein:  90-105g (1.7-2 g/kg IBW)  Total intake: 535 kcal (32% of minimum estimated needs)  26g protein (28% of minimum estimated needs)  Nutrition Dx: Inadequate oral intake related to inability to eat, acute illness, nausea, vomiting, altered GI function as evidenced by meal completion < 50%.  Goal: Pt to meet >/= 90% of their estimated nutrition needs   Intervention:  -Continue Calorie Count given poor PO and GI recommendation -Continue Ensure Enlive po BID, each supplement provides 350 kcal and 20 grams of protein  Kaylee Bibles, MS, RD, LDN Pager: [phone] After Hours Pager: [phone]

## 2017-04-26 NOTE — Progress Notes (Signed)
PROGRESS NOTE   Kaylee Montgomery  FTD:[id number]    DOB: 08/25/1981    DOA: 04/12/2017  PCP: [name], MD   I have briefly reviewed patients previous medical records in [facility].  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 Scant prior history of alcohol abuse  initially admitted 6.25.18 to [facility] acute pancreatitis (elevated LFTs, lipase 6000 range, CT and ultrasound abdomen confirming same).  Subsequently worsened, ARDS suspected, clinical concern for decompensation/need for intubation, no GI coverage at [facility] on the weekend, hence transferred to [facility] ICU 6.27  CCM evaluated and felt patient appropriate under hospitalist care.  [facility] consulted. CCM signed off. Slowly improving but expect a slow course. Remains in stepdown unit.   Assessment & Plan:   Principal Problem:   Acute pancreatitis Active Problems:   PITUITARY ADENOMA   Morbid obesity (HCC)   Seasonal and perennial allergic rhinitis   Prediabetes   Obstructive sleep apnea   S/P laparoscopic sleeve gastrectomy   Transaminitis   Anemia   Abdominal pain  SIRS without infectious etiology Acute necrotizing pancreatitis--Cryptogenic No medications to suggest etiology. No prior gallstones. No family history of pancreatitis.  Proximal splenic vein thrombosis--?could leukocytosis be 2/2 to micro splenic infarcts ? gallstone pancreatitis from sludge/microlithiasis alcohol abuse and no use since April 2018. triglycerides on admission wnl.  [facility] Continue postpyloric tube feeding-anticipate home health with feeds Per gastroenterologist-pseudocyst can probably compress stomach, this  may take weeks to resolve in terms of tolerance of full diet--deferred timing of discontinuation of parenteral feeds to gastroenterologist-please comment received 7 days Zosyn, meropenem ending on 7/2 secondary to sirs Dilaudid PCA for pain management was discontinued on 7/6 and she has much improved  7/6 PCA-transitioned to OxyContin 20 twice a day (discontinued 7/8)       Percocet 10 every 4 when necessary--we will escalate down from 10 mg to 5-10 mg every 4 when necessary Leukocytosis 20 range- Blood cultures 2 each from 6/26 and 6/29:  NGTD. Chest x-ray 6/30: No acute disease. Incentive spirometry.  No anticoagulation for splenic thrombosis Creon tid--pharmacist has been notified by nursing to kindly consolidate to as few tablets as possible Continue Lovenox for DVT prophylaxis.  We will hold off on checking labs 7/9  Expect she will do very well going forward Painful urination without signs and symptoms of urinary infection Leukocytosis: Secondary to acute pancreatitis.   Resolving  Had a foley d/c 7/5   UA 7/6 neg, pyridium 100 tid x 2 days then STOP--her dysuria is improving with Pyridium Abnormal LFTs: Hepatitis panel negative. Per GI. Resolved. Acute respiratory failure with hypoxia ohss HABITUS/ OSA:  Related to SIRS, atelectasis, untreated OSA. Incentive spirometry.  Supplemental oxygen as needed.  Patient is unwilling to use CPAP Essential hypertension:   Worsened by acute illness.  Currently on metoprolol 50 MG twice a day, increased to 75 MG twice a day.   Continue when necessary IV labetalol.  Added clonidine 0.2 bid [might also help with anxiety]--blood pressure seems to be stable and relatively well-controlled and her tachycardia is likely secondary to anxiety and is improved Electrolytes (hypokalemia, hypomagnesemia, hypophosphatemia):   Replace and follow as needed. Have stopped checking for micronutrients as patient is stabilizing  Anemia Secondary to critical  illness  Stable. Follow CBCs from a distance History of sleeve gastrectomy 02/2016  Morbid obesity/Body mass index is 37.81 kg/m.   Stable at present time and is eating small amounts for her meals     DVT prophylaxis: Heparin Code Status: Full Family Communication: Discussed with father briefly 7/5 and 7/6, discussed with mother outside 04/25/2017 Disposition: Transfer to telemetry 7/6   Consultants:   PCCM-signed off 7/2 [place] radiology -to place NG tube and right upper extremity PICC line  Procedures:  Postpyloric NG tube placed on PICC line  Antimicrobials:  Zosyn-discontinued. IV meropenem 6/30 > 7/3   Subjective:   alert less than oriented no new issue  No dysuria Laughing and joking with mother and son in the room  Seems much more at ease  Not in pain  Tolerating some diet Questions now related to disposition , feeds and further plans --all of which I try to attempt to answer to the best of my ability    Objective:  Vitals:   04/25/17 1329 04/25/17 2240 04/26/17 0614 04/26/17 1523  BP: 118/72 (!) 143/97 121/77 121/67  Pulse: 89 (!) 103 95 88  Resp: 18 16 16 18   Temp: 98.5 F (36.9 C) 99.8 F (37.7 C) 98.4 F (36.9 C) 98 F (36.7 C)  TempSrc: Oral Oral Oral Oral  SpO2: 95% 98% 97% 96%  Weight:   99.9 kg (220 lb 4.8 oz)   Height:        Examination:  Alert oriented  No icterus No pallor Chest is clear Abdomen is soft no tenderness no rebound  No lower extremity edema  Moving all 4 limbs equally NG tube is still in place Getting feeds Neurologically intact Less anxious appearing   Data Reviewed: I have personally reviewed following labs and imaging studies  CBC:  Recent Labs Lab 04/22/17 0317 04/23/17 0314 04/24/17 0447 04/25/17 0500 04/26/17 0409  WBC 24.3* 24.0* 18.7* 15.8* 14.9*  HGB 8.5* 8.5* 8.3* 8.4* 8.9*  HCT 26.7* 26.4* 25.8* 26.1* 27.9*  MCV 79.2 79.3 79.6 78.9 79.3  PLT 486* 620* 666* 786* 893*    Basic Metabolic Panel:  Recent Labs Lab 04/20/17 0348 04/21/17 0343 04/22/17 0300 04/22/17 0317 04/22/17 0330 04/23/17 0314 04/24/17 0447 04/25/17 0500 04/26/17 0409  NA 139 139  --  137  --  133* 138 136 137  K 3.3* 3.4*  --  4.0  --  4.0 3.9 3.9 3.9  CL 103 103  --  102  --  99* 104 103 103  CO2 29 30  --  29  --  25 25 24 27   GLUCOSE 137* 145*  --  170*  --  164* 148* 155* 160*  BUN 8 8  --  9  --  11 12 12 10   CREATININE 0.54 0.43*  --  0.42*  --  0.43* 0.47 0.48 0.46  CALCIUM 8.0* 8.1*  --  8.3*  --  8.4* 8.6* 8.7* 9.0  MG 1.8  --   --   --  1.9  --   --   --   --   PHOS 1.8* 2.4* 2.6  --   --  3.1 3.2  --   --    Liver Function Tests:  Recent Labs Lab 04/22/17 0317 04/23/17 0314 04/24/17 0447 04/25/17 0500 04/26/17 0409  AST 36 28 23 20 21   ALT 52 39 30 26 25   ALKPHOS 105 85 72 64 65  BILITOT 0.7 0.5 0.5 0.5 0.4  PROT 6.7 6.6 7.0 6.9 7.4  ALBUMIN 2.6* 2.6* 2.8* 2.8* 3.2*    CBG:  Recent Labs Lab 04/25/17 2035 04/26/17 0110 04/26/17 0503 04/26/17 0757 04/26/17 1147  GLUCAP 151* 160* 148* 154* 219*    Recent Results (from the past 240 hour(s))  Culture, blood (Routine X 2) w Reflex to ID Panel     Status: None   Collection Time: 04/17/17 12:29 PM  Result Value Ref Range Status   Specimen Description BLOOD RIGHT HAND  Final   Special Requests Blood Culture adequate volume  Final   Culture NO GROWTH 5 DAYS  Final   Report Status 04/22/2017 FINAL  Final  Culture, blood (Routine X 2) w Reflex to ID Panel     Status: None   Collection Time: 04/17/17 12:29 PM  Result Value Ref Range Status   Specimen Description BLOOD PICC LINE DRAW  Final   Special Requests   Final    Blood Culture results may not be optimal due to an inadequate volume of blood received in culture bottles   Culture NO GROWTH 5 DAYS  Final   Report Status 04/22/2017 FINAL  Final      Radiology Studies: Ct Abdomen Pelvis W Contrast  Result Date: 04/25/2017 CLINICAL DATA:   Followup of pancreatitis.  Severe abdominal pain. EXAM: CT ABDOMEN AND PELVIS WITH CONTRAST TECHNIQUE: Multidetector CT imaging of the abdomen and pelvis was performed using the standard protocol following bolus administration of intravenous contrast. CONTRAST:  140mL ISOVUE-300 IOPAMIDOL (ISOVUE-300) INJECTION 61% COMPARISON:  04/17/2017 FINDINGS: Lower chest: Left worse than right base airspace disease. Minimally improved. Mild cardiomegaly, with similar small left pleural effusion. The right-sided pleural effusion has resolved. Hepatobiliary: Hepatomegaly is marked, 25.5 cm craniocaudal. No focal liver lesion. Contracted gallbladder, without pericholecystic edema or biliary duct dilatation. Pancreas: Re- demonstration of pancreatic necrosis involving the body and neck. Enhancing pancreatic tail and head/ uncinate process remain. Increased organization of peripancreatic fluid collection. Example at 10.7 x 4.0 cm on image 32/series 2. The more ill-defined collections extending anteriorly into the gastrocolic ligament. These either relatively similar. Example at 3.0 cm on image 46/series 2 Spleen: Normal in size, without focal abnormality. Adrenals/Urinary Tract: Normal adrenal glands. Normal kidneys, without hydronephrosis. Posterior bladder wall thickening is mild to moderate with mild mucosal hyperenhancement. Example image 77/series 2. Stomach/Bowel: Surgical changes about the greater curvature of the stomach. Feeding tube terminates in the proximal jejunum. The stomach is displaced anteriorly by the peripancreatic fluid collection. Normal colon, appendix, and terminal ileum. Hyperattenuation in the proximal jejunum on image 38/ series 2 is presumably due to pill ingestion. Vascular/Lymphatic: Normal caliber of the aorta and branch vessels. Splenic vein compressed and likely thrombosed. Portal vein not well evaluated. Mild gastrohepatic ligament adenopathy including at 13 mm on image 26/ series 2. Likely  reactive. Reproductive: Normal uterus and adnexa. Other: Trace free pelvic fluid could be physiologic. Musculoskeletal: Disc bulge at L4-5 IMPRESSION: 1. Similar appearance of necrotizing pancreatitis. Peripancreatic fluid collections are increasingly organizing, most consistent with developing pseudocysts. 2. 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 3. Resolved right pleural effusion. Similar small left pleural effusion with improved bibasilar airspace disease. 4. Bladder wall thickening and mucosal hyperenhancement, suggesting cystitis. 5. Hepatomegaly. Electronically Signed   By: Abigail Miyamoto M.D.   On: 04/25/2017 12:14     Scheduled Meds: . ALPRAZolam  0.25 mg Oral Daily  . chlorhexidine  15 mL Mouth Rinse BID  . Chlorhexidine Gluconate  Cloth  6 each Topical Daily  . cloNIDine  0.2 mg Oral BID  . feeding supplement (ENSURE ENLIVE)  237 mL Oral BID BM  . heparin  5,000 Units Subcutaneous Q8H  . lipase/protease/amylase  36,000 Units Oral TID BM  . lipase/protease/amylase  72,000 Units Oral TID WC  . magnesium chloride  1 tablet Oral q morning - 10a  . mouth rinse  15 mL Mouth Rinse q12n4p  . metoprolol tartrate  75 mg Oral BID  . multivitamin with minerals  1 tablet Oral Daily  . oxyCODONE  20 mg Oral Q12H  . pantoprazole  40 mg Oral BID  . sodium chloride flush  10-40 mL Intracatheter Q12H   Continuous Infusions: . feeding supplement (VITAL AF 1.2 CAL) 1,000 mL (04/26/17 0617)     LOS: 13 days   Verneita Griffes, MD [facility] (P) [phone]

## 2017-04-26 NOTE — Progress Notes (Signed)
[facility] Progress Note  Subjective: patient feels okay today, no definite pain, tolerating some diet along with tube feeds  Objective: Vital signs in last 24 hours: Temp:  [98.4 F (36.9 C)-99.8 F (37.7 C)] 98.4 F (36.9 C) (07/08 2025) Pulse Rate:  [89-109] 95 (07/08 0614) Resp:  [16-18] 16 (07/08 0614) BP: (118-143)/(72-97) 121/77 (07/08 0614) SpO2:  [95 %-98 %] 97 % (07/08 0614) Weight:  [99.9 kg (220 lb 4.8 oz)] 99.9 kg (220 lb 4.8 oz) (07/08 0614) Weight change: -1.542 kg (-3 lb 6.4 oz)   PE: Unchanged  Lab Results: Results for orders placed or performed during the hospital encounter of 04/12/17 (from the past 24 hour(s))  Glucose, capillary     Status: Abnormal   Collection Time: 04/25/17 12:12 PM  Result Value Ref Range   Glucose-Capillary 189 (H) 65 - 99 mg/dL  Glucose, capillary     Status: Abnormal   Collection Time: 04/25/17  4:26 PM  Result Value Ref Range   Glucose-Capillary 131 (H) 65 - 99 mg/dL  Glucose, capillary     Status: Abnormal   Collection Time: 04/25/17  8:35 PM  Result Value Ref Range   Glucose-Capillary 151 (H) 65 - 99 mg/dL  Glucose, capillary     Status: Abnormal   Collection Time: 04/26/17  1:10 AM  Result Value Ref Range   Glucose-Capillary 160 (H) 65 - 99 mg/dL  Comprehensive metabolic panel     Status: Abnormal   Collection Time: 04/26/17  4:09 AM  Result Value Ref Range   Sodium 137 135 - 145 mmol/L   Potassium 3.9 3.5 - 5.1 mmol/L   Chloride 103 101 - 111 mmol/L   CO2 27 22 - 32 mmol/L   Glucose, Bld 160 (H) 65 - 99 mg/dL   BUN 10 6 - 20 mg/dL   Creatinine, Ser 0.46 0.44 - 1.00 mg/dL   Calcium 9.0 8.9 - 10.3 mg/dL   Total Protein 7.4 6.5 - 8.1 g/dL   Albumin 3.2 (L) 3.5 - 5.0 g/dL   AST 21 15 - 41 U/L   ALT 25 14 - 54 U/L   Alkaline Phosphatase 65 38 - 126 U/L   Total Bilirubin 0.4 0.3 - 1.2 mg/dL   GFR calc non Af Amer >60 >60 mL/min   GFR calc Af Amer >60 >60 mL/min   Anion gap 7 5 - 15  CBC     Status:  Abnormal   Collection Time: 04/26/17  4:09 AM  Result Value Ref Range   WBC 14.9 (H) 4.0 - 10.5 K/uL   RBC 3.52 (L) 3.87 - 5.11 MIL/uL   Hemoglobin 8.9 (L) 12.0 - 15.0 g/dL   HCT 27.9 (L) 36.0 - 46.0 %   MCV 79.3 78.0 - 100.0 fL   MCH 25.3 (L) 26.0 - 34.0 pg   MCHC 31.9 30.0 - 36.0 g/dL   RDW 13.9 11.5 - 15.5 %   Platelets 893 (H) 150 - 400 K/uL  Lipase, blood     Status: Abnormal   Collection Time: 04/26/17  4:09 AM  Result Value Ref Range   Lipase 67 (H) 11 - 51 U/L  Glucose, capillary     Status: Abnormal   Collection Time: 04/26/17  5:03 AM  Result Value Ref Range   Glucose-Capillary 148 (H) 65 - 99 mg/dL  Glucose, capillary     Status: Abnormal   Collection Time: 04/26/17  7:57 AM  Result Value Ref Range   Glucose-Capillary 154 (H) 65 -  99 mg/dL    Studies/Results: Ct Abdomen Pelvis W Contrast  Result Date: 04/25/2017 CLINICAL DATA:  Followup of pancreatitis.  Severe abdominal pain. EXAM: CT ABDOMEN AND PELVIS WITH CONTRAST TECHNIQUE: Multidetector CT imaging of the abdomen and pelvis was performed using the standard protocol following bolus administration of intravenous contrast. CONTRAST:  144mL ISOVUE-300 IOPAMIDOL (ISOVUE-300) INJECTION 61% COMPARISON:  04/17/2017 FINDINGS: Lower chest: Left worse than right base airspace disease. Minimally improved. Mild cardiomegaly, with similar small left pleural effusion. The right-sided pleural effusion has resolved. Hepatobiliary: Hepatomegaly is marked, 25.5 cm craniocaudal. No focal liver lesion. Contracted gallbladder, without pericholecystic edema or biliary duct dilatation. Pancreas: Re- demonstration of pancreatic necrosis involving the body and neck. Enhancing pancreatic tail and head/ uncinate process remain. Increased organization of peripancreatic fluid collection. Example at 10.7 x 4.0 cm on image 32/series 2. The more ill-defined collections extending anteriorly into the gastrocolic ligament. These either relatively similar.  Example at 3.0 cm on image 46/series 2 Spleen: Normal in size, without focal abnormality. Adrenals/Urinary Tract: Normal adrenal glands. Normal kidneys, without hydronephrosis. Posterior bladder wall thickening is mild to moderate with mild mucosal hyperenhancement. Example image 77/series 2. Stomach/Bowel: Surgical changes about the greater curvature of the stomach. Feeding tube terminates in the proximal jejunum. The stomach is displaced anteriorly by the peripancreatic fluid collection. Normal colon, appendix, and terminal ileum. Hyperattenuation in the proximal jejunum on image 38/ series 2 is presumably due to pill ingestion. Vascular/Lymphatic: Normal caliber of the aorta and branch vessels. Splenic vein compressed and likely thrombosed. Portal vein not well evaluated. Mild gastrohepatic ligament adenopathy including at 13 mm on image 26/ series 2. Likely reactive. Reproductive: Normal uterus and adnexa. Other: Trace free pelvic fluid could be physiologic. Musculoskeletal: Disc bulge at L4-5 IMPRESSION: 1. Similar appearance of necrotizing pancreatitis. Peripancreatic fluid collections are increasingly organizing, most consistent with developing pseudocysts. 2. 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 3. Resolved right pleural effusion. Similar small left pleural effusion with improved bibasilar airspace disease. 4. Bladder wall thickening and mucosal hyperenhancement, suggesting cystitis. 5. Hepatomegaly. Electronically Signed   By: Abigail Miyamoto M.D.   On: 04/25/2017 12:14      Assessment: Pancreatitis with significant necrosis etiology unclear one-week follow-up CT mainly significant for development of fluid collections probably organizing and pseudocysts, right-sided pleural effusion resolved  Plan: With her gastric sleeve limiting her appetite I would recommend continued enteral feedings while obtaining calorie counts while continuing supportive care. GI will continue to  follow.    Yonas Bunda C 04/26/2017, 8:59 AM  Pager [phone] If no answer or after 5 PM call [phone]

## 2017-04-27 ENCOUNTER — Inpatient Hospital Stay (HOSPITAL_COMMUNITY): Payer: 59

## 2017-04-27 LAB — COMPREHENSIVE METABOLIC PANEL
ALT: 22 U/L (ref 14–54)
AST: 19 U/L (ref 15–41)
Albumin: 3.2 g/dL — ABNORMAL LOW (ref 3.5–5.0)
Alkaline Phosphatase: 60 U/L (ref 38–126)
Anion gap: 9 (ref 5–15)
BUN: 11 mg/dL (ref 6–20)
CO2: 24 mmol/L (ref 22–32)
Calcium: 8.9 mg/dL (ref 8.9–10.3)
Chloride: 102 mmol/L (ref 101–111)
Creatinine, Ser: 0.41 mg/dL — ABNORMAL LOW (ref 0.44–1.00)
GFR calc Af Amer: >60 mL/min (ref >60)
GFR calc non Af Amer: >60 mL/min (ref >60)
Glucose, Bld: 168 mg/dL — ABNORMAL HIGH (ref 65–99)
Potassium: 3.9 mmol/L (ref 3.5–5.1)
Sodium: 135 mmol/L (ref 135–145)
Total Bilirubin: 0.6 mg/dL (ref 0.3–1.2)
Total Protein: 7.3 g/dL (ref 6.5–8.1)

## 2017-04-27 LAB — GLUCOSE, CAPILLARY
Glucose-Capillary: 138 mg/dL — ABNORMAL HIGH (ref 65–99)
Glucose-Capillary: 141 mg/dL — ABNORMAL HIGH (ref 65–99)
Glucose-Capillary: 169 mg/dL — ABNORMAL HIGH (ref 65–99)
Glucose-Capillary: 178 mg/dL — ABNORMAL HIGH (ref 65–99)
Glucose-Capillary: 183 mg/dL — ABNORMAL HIGH (ref 65–99)
Glucose-Capillary: 202 mg/dL — ABNORMAL HIGH (ref 65–99)

## 2017-04-27 LAB — CBC
HCT: 27.1 % — ABNORMAL LOW (ref 36.0–46.0)
Hemoglobin: 8.7 g/dL — ABNORMAL LOW (ref 12.0–15.0)
MCH: 25 pg — ABNORMAL LOW (ref 26.0–34.0)
MCHC: 32.1 g/dL (ref 30.0–36.0)
MCV: 77.9 fL — ABNORMAL LOW (ref 78.0–100.0)
Platelets: 887 10*3/uL — ABNORMAL HIGH (ref 150–400)
RBC: 3.48 MIL/uL — ABNORMAL LOW (ref 3.87–5.11)
RDW: 13.9 % (ref 11.5–15.5)
WBC: 13.3 10*3/uL — ABNORMAL HIGH (ref 4.0–10.5)

## 2017-04-27 MED ORDER — FERROUS SULFATE 220 (44 FE) MG/5ML PO ELIX: 220.0000 mg | ORAL_SOLUTION | Freq: Two times a day (BID) | ORAL | Status: DC

## 2017-04-27 MED ORDER — VITAMIN B-12 1000 MCG PO TABS
500.0000 ug | ORAL_TABLET | Freq: Every day | ORAL | Status: DC
  Administered 2017-04-27 – 2017-04-29 (×3): 500 ug via ORAL
  Filled 2017-04-27 (×3): qty 1

## 2017-04-27 MED ORDER — ONDANSETRON HCL 4 MG/2ML IJ SOLN
4.0000 mg | Freq: Four times a day (QID) | INTRAMUSCULAR | Status: DC | PRN
  Administered 2017-04-27: 4 mg via INTRAVENOUS
  Filled 2017-04-27: qty 2

## 2017-04-27 MED ORDER — FERROUS SULFATE 300 (60 FE) MG/5ML PO SYRP
300.0000 mg | ORAL_SOLUTION | Freq: Two times a day (BID) | ORAL | Status: DC
  Administered 2017-04-27 – 2017-04-29 (×2): 300 mg via ORAL
  Filled 2017-04-27 (×5): qty 5

## 2017-04-27 MED ORDER — OSMOLITE 1.5 CAL PO LIQD
1000.0000 mL | ORAL | Status: DC
  Filled 2017-04-27 (×3): qty 1000

## 2017-04-27 MED ORDER — FREE WATER
100.0000 mL | Freq: Four times a day (QID) | Status: DC
  Administered 2017-04-27: 100 mL

## 2017-04-27 MED ORDER — LORAZEPAM 2 MG/ML IJ SOLN
0.5000 mg | Freq: Once | INTRAMUSCULAR | Status: AC
  Administered 2017-04-27: 0.5 mg via INTRAVENOUS
  Filled 2017-04-27: qty 1

## 2017-04-27 MED ORDER — PRO-STAT SUGAR FREE PO LIQD
30.0000 mL | Freq: Two times a day (BID) | ORAL | Status: DC
  Administered 2017-04-27: 30 mL
  Filled 2017-04-27: qty 30

## 2017-04-27 MED ORDER — ZINC SULFATE 220 (50 ZN) MG PO CAPS
220.0000 mg | ORAL_CAPSULE | Freq: Every day | ORAL | Status: DC
  Administered 2017-04-27 – 2017-04-29 (×3): 220 mg via ORAL
  Filled 2017-04-27 (×3): qty 1

## 2017-04-27 NOTE — Progress Notes (Signed)
Calorie Count Note Day 2  Calorie count ordered. 7/8 results below.  Diet: Heart Healthy Supplements: Ensure Enlive po BID, each supplement provides 350 kcal and 20 grams of protein  7/7: Dinner: 47kcal and 2g protein  7/8: Breakfast: 110kcal, 1g protein Lunch- not documented Dinner- not documented Supplements: 1 Ensure, 350 kcal, 20g protein  Estimated Nutritional Needs:  Kcal:  1650-1900 (32-36 kcal/kg ibw) Protein:  90-105g (1.7-2 g/kg IBW)  Total intake: 507 kcal (31% of minimum estimated needs)  23g protein (25% of minimum estimated needs)  Nutrition Dx: Inadequate oral intake related to inability to eat, acute illness, nausea, vomiting, altered GI function as evidenced by meal completion < 50%.  Goal: Pt to meet >/= 90% of their estimated nutrition needs   Intervention:  -Continue Calorie Count given poor PO and GI recommendation -Tube feeds to meet estimated needs  -Continue Ensure Enlive po BID, each supplement provides 350 kcal and 20 grams of protein  Koleen Distance MS, RD, LDN Pager #[phone] After Hours Pager: [phone]

## 2017-04-27 NOTE — Progress Notes (Signed)
Nutrition Follow-up  DOCUMENTATION CODES:   Obesity unspecified  INTERVENTION:   Osmolite 1.5 via NJT- Transition to nocturnal tube feeds- Initiate at 78m/hr from 2000-0800 (12 hrs).   Add Prostat BID via tube- each supplement provides 100 kcal, 15 grams protein.  Free water flushes 1025mq 6 hrs  Regimen provides 1460kcal/day (88% estimated needs), 83g/day protein (92% estimated needs), 104018mree water.   NUTRITION DIAGNOSIS:   Inadequate oral intake related to inability to eat, acute illness, nausea, vomiting, altered GI function as evidenced by meal completion < 50%. -ongoing  GOAL:   Patient will meet greater than or equal to 90% of their needs   Meeting with tube feeds  MONITOR:   PO intake, Supplement acceptance, Labs, Weight trends, TF tolerance, I & O's  ASSESSMENT:   [age]o female PMHx Gastric Sleeve (May 2017), HTN, Pituitary Tumor, OSA. Presented to ED with abdominal pain, nausea, vomiting. Worked up for acute pancreatitis w/o pseudocyst. Admitted for management.   Met with pt in room today. Pt reports continued poor appetite that she relates to being full from her tube feeds. Pt had a 48hr calorie count completed and pt currently meeting ~30% of her estimated needs via oral intake. Pt is drinking one Ensure per day which is contributing to the majority of her calorie and protein intake. Spoke to MD and with pt, plan to transition pt over to nocturnal tube feeds starting at around 90% of her estimated needs to try and decrease pt's level of fullness and to encourage po intakes. Will discontinue tube feeds now and restart tube feeds at 8:00pm tonight. Pt prefers to do tube feeds from 8p-8am. RD will add Prostat to help pt meet estimated needs as well as decrease volume of tube feeds. RD will follow up tomorrow and increase tube feeds if pt tolerating well. Per MD, plan to send pt home with tube feeds. Spoke to pt, pt does not feel comfortable with home tube feeds and  prefers not to discharge home with the tube. RD encouraged pt to drink at least 2 Ensure per day and try to eat frequent small meals. Pt reports that she has been drinking large amounts of water r/t constant thirst; RD suspects this may be adding to her increased fullness. Per chart, pt has lost ~2lbs since admit; RD will continue to monitor weights. Of note, pt also states that the hospital food is not appealing and she may eat better once discharged home.    Diet Order:  Diet Heart Room service appropriate? Yes; Fluid consistency: Thin  Skin:  Reviewed, no issues  Last BM:  7/8  Height:   Ht Readings from Last 1 Encounters:  04/17/17 '5\' 4"'$  (1.626 m)    Weight:   Wt Readings from Last 1 Encounters:  04/27/17 216 lb 14.4 oz (98.4 kg)    Ideal Body Weight:  52.27 kg  BMI:  Body mass index is 37.23 kg/m.  Estimated Nutritional Needs:   Kcal:  1650-1900 (32-36 kcal/kg ibw)  Protein:  90-105g (1.7-2 g/kg IBW)  Fluid:  Per MD  EDUCATION NEEDS:   No education needs identified at this time  CasKoleen Distance, RD, LDN Pager #- [phone] Hours Pager: [phone]

## 2017-04-27 NOTE — Progress Notes (Signed)
PROGRESS NOTE   Kaylee Montgomery  UTM:[id number]    DOB: 1981/07/22    DOA: 04/12/2017  PCP: [name], MD   I have briefly reviewed patients previous medical records in [facility].  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 Scant prior history of alcohol abuse  initially admitted 6.25.18 to [facility] acute pancreatitis (elevated LFTs, lipase 6000 range, CT and ultrasound abdomen confirming same).  Subsequently worsened, ARDS suspected, clinical concern for decompensation/need for intubation, no GI coverage at [facility] on the weekend, hence transferred to [facility] ICU 6.27  CCM evaluated and felt patient appropriate under hospitalist care.  [facility] consulted. CCM signed off. Slowly improving but expect a slow course. Remains in stepdown unit.   Assessment & Plan:   Principal Problem:   Acute pancreatitis Active Problems:   PITUITARY ADENOMA   Morbid obesity (HCC)   Seasonal and perennial allergic rhinitis   Prediabetes   Obstructive sleep apnea   S/P laparoscopic sleeve gastrectomy   Transaminitis   Anemia   Abdominal pain  SIRS without infectious etiology Acute necrotizing pancreatitis--Cryptogenic No medications to suggest etiology. No prior gallstones. No family history of pancreatitis.  Proximal splenic vein thrombosis--?could leukocytosis be 2/2 to micro splenic infarcts ? gallstone pancreatitis from sludge/microlithiasis alcohol abuse and no use since April 2018. triglycerides on admission wnl.  [facility] Continue postpyloric tube feeding-anticipate home health with feeds Per gastroenterologist-pseudocyst can probably compress stomach, this may take weeks to  resolve in terms of tolerance of full diet received 7 days Zosyn, meropenem ending on 7/2 secondary to sirs Dilaudid PCA for pain management was discontinued on 7/6 and she has much improved  7/6 PCA-transitioned to OxyContin 20 twice a day (discontinued 7/8)       Percocet 10 every 4 when necessary--we will escalate down from 10 mg to 5-10 mg every 4 when necessary Leukocytosis 20 range- Blood cultures 2 each from 6/26 and 6/29:  NGTD. Chest x-ray 6/30: No acute disease. Incentive spirometry.  No anticoagulation for splenic thrombosis Creon tid--pharmacist has been notified by nursing to kindly consolidate to as few tablets as possible Continue Lovenox for DVT prophylaxis.  We will hold off on checking labs 7/9  Expect she will do very well going forward Malnutrition Electrolytes (hypokalemia, hypomagnesemia, hypophosphatemia):   Replace and follow as needed.   Appreciate input from dietitian  Initiate feeds via feeding tube daily at bedtime 8pm-8 am  Has lost 2 pounds since admission  We'll need micronutrients given her history of sleeve gastrectomy --would replace as per dietitian   Check a.m. phosphorus, magnesium Painful urination without signs and symptoms of urinary infection Leukocytosis: Secondary to acute pancreatitis.   Resolving  Had a foley d/c 7/5   UA 7/6 neg, pyridium 100 tid x 2 days then STOP--her dysuria is improving with Pyridium and this was discontinued 7/8  Abnormal LFTs: Hepatitis panel negative. Per GI. Resolved. Acute respiratory failure with hypoxia ohss HABITUS/ OSA:  Related to SIRS, atelectasis, untreated OSA. Incentive spirometry.  Supplemental oxygen as needed.  Patient is unwilling to use CPAP Essential hypertension:   Worsened by acute illness.  Currently on metoprolol 50 MG twice a day, increased to 75 MG twice a day.   Continue when necessary IV  labetalol.  Added clonidine 0.2 bid [might also help with anxiety]--blood pressure seems to be stable  and relatively well-controlled and her tachycardia is likely secondary to anxiety and is improved  Anemia Secondary to critical illness  Stable. Follow CBCs from a distance History of sleeve gastrectomy 02/2016  Morbid obesity/Body mass index is 37.23 kg/m.   Stable at present time and is eating small amounts for her meals     DVT prophylaxis: Heparin Code Status: Full Family Communication: Discussed with patient and grandmother in the room  Disposition: Transfer to telemetry 7/6  Possibly nearing Discharge in the next 24-48 hours   Consultants:   PCCM-signed off 7/2 [place] [facility] -to place NG tube and right upper extremity PICC line  Procedures:  Postpyloric NG tube placed on PICC line  Antimicrobials:  Zosyn-discontinued. IV meropenem 6/30 > 7/3   Subjective:   alert doing well  Eating and drinking some  Has some discomfort in epigastrium  -Feels bloated and has some discomfort -also has some scant vaginal discharge   Objective:  Vitals:   04/26/17 1523 04/26/17 2054 04/27/17 0414 04/27/17 1349  BP: 121/67 138/67 127/79 105/70  Pulse: 88 76 86 83  Resp: 18 18 18 18   Temp: 98 F (36.7 C) 98.4 F (36.9 C) 98.5 F (36.9 C) 98.4 F (36.9 C)  TempSrc: Oral Oral Oral Oral  SpO2: 96% 98% 96% 99%  Weight:   98.4 kg (216 lb 14.4 oz)   Height:        Examination:  Pleasant alert  No icterus mild pallor EOMI NCAT Abdomen slightly tender in epigastrium no organomegaly No lower extremity edema No other issues   Data Reviewed: I have personally reviewed following labs and imaging studies  CBC:  Recent Labs Lab 04/23/17 0314 04/24/17 0447 04/25/17 0500 04/26/17 0409 04/27/17 0405  WBC 24.0* 18.7* 15.8* 14.9* 13.3*  HGB 8.5* 8.3* 8.4* 8.9* 8.7*  HCT 26.4* 25.8* 26.1* 27.9* 27.1*  MCV 79.3 79.6 78.9 79.3 77.9*  PLT 620* 666* 786* 893* 656*   Basic Metabolic Panel:  Recent Labs Lab 04/21/17 0343 04/22/17 0300   04/22/17 0330 04/23/17 0314 04/24/17 0447 04/25/17 0500 04/26/17 0409 04/27/17 0405  NA 139  --   < >  --  133* 138 136 137 135  K 3.4*  --   < >  --  4.0 3.9 3.9 3.9 3.9  CL 103  --   < >  --  99* 104 103 103 102  CO2 30  --   < >  --  25 25 24 27 24   GLUCOSE 145*  --   < >  --  164* 148* 155* 160* 168*  BUN 8  --   < >  --  11 12 12 10 11   CREATININE 0.43*  --   < >  --  0.43* 0.47 0.48 0.46 0.41*  CALCIUM 8.1*  --   < >  --  8.4* 8.6* 8.7* 9.0 8.9  MG  --   --   --  1.9  --   --   --   --   --   PHOS 2.4* 2.6  --   --  3.1 3.2  --   --   --   < > = values in this interval not displayed. Liver Function Tests:  Recent Labs Lab 04/23/17 0314 04/24/17 0447 04/25/17 0500 04/26/17 0409 04/27/17 0405  AST 28 23 20 21 19   ALT 39 30  26 25 22   ALKPHOS 85 72 64 65 60  BILITOT 0.5 0.5 0.5 0.4 0.6  PROT 6.6 7.0 6.9 7.4 7.3  ALBUMIN 2.6* 2.8* 2.8* 3.2* 3.2*    CBG:  Recent Labs Lab 04/26/17 2033 04/27/17 0002 04/27/17 0409 04/27/17 0756 04/27/17 1207  GLUCAP 154* 169* 178* 183* 202*    No results found for this or any previous visit (from the past 240 hour(s)).    Radiology Studies: No results found.   Scheduled Meds: . ALPRAZolam  0.25 mg Oral Daily  . chlorhexidine  15 mL Mouth Rinse BID  . Chlorhexidine Gluconate Cloth  6 each Topical Daily  . cloNIDine  0.2 mg Oral BID  . feeding supplement (ENSURE ENLIVE)  237 mL Oral BID BM  . feeding supplement (PRO-STAT SUGAR FREE 64)  30 mL Per Tube BID  . free water  100 mL Per Tube Q6H  . heparin  5,000 Units Subcutaneous Q8H  . lipase/protease/amylase  36,000 Units Oral TID BM  . lipase/protease/amylase  72,000 Units Oral TID WC  . magnesium chloride  1 tablet Oral q morning - 10a  . mouth rinse  15 mL Mouth Rinse q12n4p  . metoprolol tartrate  75 mg Oral BID  . multivitamin with minerals  1 tablet Oral Daily  . pantoprazole  40 mg Oral BID  . sodium chloride flush  10-40 mL Intracatheter Q12H   Continuous  Infusions: . feeding supplement (OSMOLITE 1.5 CAL)       LOS: 14 days   Verneita Griffes, MD [facility] (P) [phone]

## 2017-04-27 NOTE — Progress Notes (Signed)
Upon assessment pt complains of nausea and is actively dry heaving. She states "I feel like this tube is choking me and coming out." Vital signs stable. On call NP, Baltazar Najjar notified of situation and ordered abd xray to verify placement. Zofran also ordered and administered to help with nausea. Will continue to monitor.

## 2017-04-27 NOTE — Progress Notes (Signed)
[facility] PROGRESS NOTE Subjective patient appears to be doing fairly well. She's not having any pain with eating that is eating very little. She feels that with the gastric sleeve and the tube feeding that she is feeling quite full.  Objective: Vital signs in last 24 hours: Temp:  [98 F (36.7 C)-98.5 F (36.9 C)] 98.5 F (36.9 C) (07/09 0414) Pulse Rate:  [76-88] 86 (07/09 0414) Resp:  [18] 18 (07/09 0414) BP: (121-138)/(67-79) 127/79 (07/09 0414) SpO2:  [96 %-98 %] 96 % (07/09 0414) Weight:  [98.4 kg (216 lb 14.4 oz)] 98.4 kg (216 lb 14.4 oz) (07/09 0414) Last BM Date: 04/26/17  Intake/Output from previous day: 07/08 0701 - 07/09 0700 In: 1740 [P.O.:300; NG/GT:1440] Out: 2500 [Urine:2500] Intake/Output this shift: No intake/output data recorded.  PE: General-- nasogastric tube in place. Patient and no distress  Abdomen-- soft, nontender good bowel sounds  Lab Results:  Recent Labs  04/25/17 0500 04/26/17 0409 04/27/17 0405  WBC 15.8* 14.9* 13.3*  HGB 8.4* 8.9* 8.7*  HCT 26.1* 27.9* 27.1*  PLT 786* 893* 887*   BMET  Recent Labs  04/25/17 0500 04/26/17 0409 04/27/17 0405  NA 136 137 135  K 3.9 3.9 3.9  CL 103 103 102  CO2 24 27 24   CREATININE 0.48 0.46 0.41*   LFT  Recent Labs  04/25/17 0500 04/26/17 0409 04/27/17 0405  PROT 6.9 7.4 7.3  AST 20 21 19   ALT 26 25 22   ALKPHOS 64 65 60  BILITOT 0.5 0.4 0.6   PT/INR No results for input(s): LABPROT, INR in the last 72 hours. PANCREAS  Recent Labs  04/26/17 0409  LIPASE 67*         Studies/Results: Ct Abdomen Pelvis W Contrast  Result Date: 04/25/2017 CLINICAL DATA:  Followup of pancreatitis.  Severe abdominal pain. EXAM: CT ABDOMEN AND PELVIS WITH CONTRAST TECHNIQUE: Multidetector CT imaging of the abdomen and pelvis was performed using the standard protocol following bolus administration of intravenous contrast. CONTRAST:  159mL ISOVUE-300 IOPAMIDOL (ISOVUE-300) INJECTION  61% COMPARISON:  04/17/2017 FINDINGS: Lower chest: Left worse than right base airspace disease. Minimally improved. Mild cardiomegaly, with similar small left pleural effusion. The right-sided pleural effusion has resolved. Hepatobiliary: Hepatomegaly is marked, 25.5 cm craniocaudal. No focal liver lesion. Contracted gallbladder, without pericholecystic edema or biliary duct dilatation. Pancreas: Re- demonstration of pancreatic necrosis involving the body and neck. Enhancing pancreatic tail and head/ uncinate process remain. Increased organization of peripancreatic fluid collection. Example at 10.7 x 4.0 cm on image 32/series 2. The more ill-defined collections extending anteriorly into the gastrocolic ligament. These either relatively similar. Example at 3.0 cm on image 46/series 2 Spleen: Normal in size, without focal abnormality. Adrenals/Urinary Tract: Normal adrenal glands. Normal kidneys, without hydronephrosis. Posterior bladder wall thickening is mild to moderate with mild mucosal hyperenhancement. Example image 77/series 2. Stomach/Bowel: Surgical changes about the greater curvature of the stomach. Feeding tube terminates in the proximal jejunum. The stomach is displaced anteriorly by the peripancreatic fluid collection. Normal colon, appendix, and terminal ileum. Hyperattenuation in the proximal jejunum on image 38/ series 2 is presumably due to pill ingestion. Vascular/Lymphatic: Normal caliber of the aorta and branch vessels. Splenic vein compressed and likely thrombosed. Portal vein not well evaluated. Mild gastrohepatic ligament adenopathy including at 13 mm on image 26/ series 2. Likely reactive. Reproductive: Normal uterus and adnexa. Other: Trace free pelvic fluid could be physiologic. Musculoskeletal: Disc bulge at L4-5 IMPRESSION: 1. Similar appearance of necrotizing pancreatitis. Peripancreatic fluid  collections are increasingly organizing, most consistent with developing pseudocysts. 2. 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 3. Resolved right pleural effusion. Similar small left pleural effusion with improved bibasilar airspace disease. 4. Bladder wall thickening and mucosal hyperenhancement, suggesting cystitis. 5. Hepatomegaly. Electronically Signed   By: Abigail Miyamoto M.D.   On: 04/25/2017 12:14    Medications: I have reviewed the patient's current medications.  Assessment/Plan: 1. Acute necrotizing pancreatitis with pseudocyst. Etiology is unclear. Minimal alcohol intake. Her triglycerides are reported to be normal. Most likely explanation is gallbladder sludge but no gallstone seen on ultrasound. CT unchanged showing extensive pancreatic necrosis with large peripancreatic pseudocyst. Patient is relatively asymptomatic.  We could consider holding the tube feedings and doing calorie count based on diet and oral ensure. If she is able to consume adequate calories, she could likely be discharged on oral medications with follow-up as outpatient.   Delenn Ahn JR,Melane Windholz L 04/27/2017, 7:58 AM  This note was created using voice recognition software. Minor errors may Have occurred unintentionally.  Pager: [phone] If no answer or after hours call [phone]

## 2017-04-27 NOTE — Care Management Note (Signed)
Case Management Note  Patient Details  Name: SHAWNTAE LOWY MRN: [id number] Date of Birth: December 11, 1980  Subjective/Objective:  Pt  initially admitted 6.25.18 to [facility] acute pancreatitis (elevated LFTs, lipase 6000 range, CT and ultrasound abdomen confirming same).  Subsequently worsened, ARDS suspected, clinical concern for decompensation/need for intubation, no GI coverage at [facility] on the weekend, hence transferred to [facility] ICU 6.27                 Action/Plan: Plan to discharge home with [facility]   Expected Discharge Date:   04/29/2017               Expected Discharge Plan:  [place]  In-House Referral:     Discharge planning Services  CM Consult  Post Acute Care Choice:  Home Health Choice offered to:  Patient  DME Arranged:    DME Agency:  [place]:  PT [facility] Agency:     Status of Service:  In process, will continue to follow  If discussed at Long Length of Stay Meetings, dates discussed:    Additional CommentsPurcell Mouton, RN 04/27/2017, 12:06 PM

## 2017-04-28 LAB — COMPREHENSIVE METABOLIC PANEL
ALT: 20 U/L (ref 14–54)
AST: 18 U/L (ref 15–41)
Albumin: 3.4 g/dL — ABNORMAL LOW (ref 3.5–5.0)
Alkaline Phosphatase: 57 U/L (ref 38–126)
Anion gap: 11 (ref 5–15)
BUN: 11 mg/dL (ref 6–20)
CO2: 24 mmol/L (ref 22–32)
Calcium: 9.3 mg/dL (ref 8.9–10.3)
Chloride: 103 mmol/L (ref 101–111)
Creatinine, Ser: 0.5 mg/dL (ref 0.44–1.00)
GFR calc Af Amer: >60 mL/min (ref >60)
GFR calc non Af Amer: >60 mL/min (ref >60)
Glucose, Bld: 136 mg/dL — ABNORMAL HIGH (ref 65–99)
Potassium: 3.5 mmol/L (ref 3.5–5.1)
Sodium: 138 mmol/L (ref 135–145)
Total Bilirubin: 0.5 mg/dL (ref 0.3–1.2)
Total Protein: 7.4 g/dL (ref 6.5–8.1)

## 2017-04-28 LAB — GLUCOSE, CAPILLARY
Glucose-Capillary: 110 mg/dL — ABNORMAL HIGH (ref 65–99)
Glucose-Capillary: 127 mg/dL — ABNORMAL HIGH (ref 65–99)
Glucose-Capillary: 128 mg/dL — ABNORMAL HIGH (ref 65–99)
Glucose-Capillary: 129 mg/dL — ABNORMAL HIGH (ref 65–99)
Glucose-Capillary: 133 mg/dL — ABNORMAL HIGH (ref 65–99)
Glucose-Capillary: 150 mg/dL — ABNORMAL HIGH (ref 65–99)
Glucose-Capillary: 190 mg/dL — ABNORMAL HIGH (ref 65–99)

## 2017-04-28 LAB — CBC
HCT: 28 % — ABNORMAL LOW (ref 36.0–46.0)
Hemoglobin: 9.1 g/dL — ABNORMAL LOW (ref 12.0–15.0)
MCH: 25.5 pg — ABNORMAL LOW (ref 26.0–34.0)
MCHC: 32.5 g/dL (ref 30.0–36.0)
MCV: 78.4 fL (ref 78.0–100.0)
Platelets: 890 10*3/uL — ABNORMAL HIGH (ref 150–400)
RBC: 3.57 MIL/uL — ABNORMAL LOW (ref 3.87–5.11)
RDW: 13.8 % (ref 11.5–15.5)
WBC: 12.5 10*3/uL — ABNORMAL HIGH (ref 4.0–10.5)

## 2017-04-28 LAB — PHOSPHORUS: Phosphorus: 3.9 mg/dL (ref 2.5–4.6)

## 2017-04-28 LAB — MAGNESIUM: Magnesium: 2 mg/dL (ref 1.7–2.4)

## 2017-04-28 MED ORDER — MAGNESIUM CHLORIDE 64 MG PO TBEC: 1.0000 | DELAYED_RELEASE_TABLET | Freq: Every morning | ORAL | 0 refills | Status: DC

## 2017-04-28 MED ORDER — PANTOPRAZOLE SODIUM 40 MG PO TBEC: 40.0000 mg | DELAYED_RELEASE_TABLET | Freq: Two times a day (BID) | ORAL | 0 refills | Status: DC

## 2017-04-28 MED ORDER — ALPRAZOLAM 0.25 MG PO TABS: 0.2500 mg | ORAL_TABLET | Freq: Every day | ORAL | 0 refills | Status: DC

## 2017-04-28 MED ORDER — CLONIDINE HCL 0.2 MG PO TABS: 0.2000 mg | ORAL_TABLET | Freq: Two times a day (BID) | ORAL | 0 refills | Status: DC

## 2017-04-28 MED ORDER — CYANOCOBALAMIN 500 MCG PO TABS: 500.0000 ug | ORAL_TABLET | Freq: Every day | ORAL | 0 refills | Status: DC

## 2017-04-28 MED ORDER — PANCRELIPASE (LIP-PROT-AMYL) 36000-114000 UNITS PO CPEP: 72000.0000 [IU] | ORAL_CAPSULE | Freq: Three times a day (TID) | ORAL | 0 refills | Status: DC

## 2017-04-28 MED ORDER — METOPROLOL TARTRATE 75 MG PO TABS: 75.0000 mg | ORAL_TABLET | Freq: Two times a day (BID) | ORAL | 0 refills | Status: DC

## 2017-04-28 MED ORDER — PANCRELIPASE (LIP-PROT-AMYL) 36000-114000 UNITS PO CPEP: 36000.0000 [IU] | ORAL_CAPSULE | Freq: Three times a day (TID) | ORAL | 0 refills | Status: DC

## 2017-04-28 MED ORDER — FERROUS SULFATE 300 (60 FE) MG/5ML PO SYRP: 300.0000 mg | ORAL_SOLUTION | Freq: Two times a day (BID) | ORAL | 3 refills | Status: DC

## 2017-04-28 NOTE — Discharge Summary (Signed)
Physician Discharge Summary  Kaylee Montgomery PYP:[id number] DOB: 1981-04-23 DOA: 04/12/2017  PCP: Kaylee Helper, MD  Admit date: 04/12/2017 Discharge date: 04/28/2017  Time spent: 50 minutes  Recommendations for Outpatient Follow-up:  1. Please ensure follow-up with gastroenterologist [facility] in about one week-will need CT scan around the end of the month as per his instructions to rule out pseudocyst 2. Patient will need magnesium, phosphorus, complete metabolic panel, lipase in about 4 days' time at PCP office 3. Recommend that patient continue Creon long-term/3 months at least until resolution of pancreatitis 4. Patient needs outpatient referral for anxiety and would benefit from cognitive behavioral approach as an out patient-we have started Xanax temporarily this admission 5. Determine with patient if she would be willing to use BiPAP as an outpatient for her OSA-she has been unwilling to do the same in the hospital   Discharge Diagnoses:  Principal Problem:   Acute pancreatitis Active Problems:   PITUITARY ADENOMA   Morbid obesity ([place])   Seasonal and perennial allergic rhinitis   Prediabetes   Obstructive sleep apnea   S/P laparoscopic sleeve gastrectomy   Transaminitis   Anemia   Abdominal pain   Discharge Condition: Improved  Diet recommendation: Soft diet  Filed Weights   04/26/17 0614 04/27/17 0414 04/28/17 0452  Weight: 99.9 kg (220 lb 4.8 oz) 98.4 kg (216 lb 14.4 oz) 97.8 kg (215 lb 11.2 oz)    History of present illness:  36  HTN,  Migraine Management by Neurologist             Has had trigger point injections for this pituitary adenoma diagnosed after unexplained bilateral  lactation 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  Subsequently worsened, ARDS suspected, clinical concern for decompensation/need for intubation, no GI coverage at [facility] on the weekend, hence transferred to [facility] ICU 6.27  CCM evaluated and felt patient appropriate under hospitalist care.  [facility] consulted. CCM signed off. Slowly improving but expect a slow course. Remains in stepdown unit  Hospital Course:  SIRS without infectious etiology Acute necrotizing pancreatitis--Cryptogenic No medications to suggest etiology. No prior gallstones. No family history of pancreatitis.  Proximal splenic vein thrombosis--?could leukocytosis be 2/2 to micro splenic infarcts ? gallstone pancreatitis from sludge/microlithiasis alcohol abuse and no use since April 2018. triglycerides on admission wnl.  [facility] Continue postpyloric tube feeding-anticipate home health with feeds Per gastroenterologist-pseudocyst will need outpatient characterization at the end of the month received 7 days Zosyn, meropenem ending on 7/2 secondary to sirs Dilaudid PCA for pain management was discontinued on 7/6 and she has much improved       7/6 PCA-transitioned to OxyContin 20 twice a day (discontinued 7/8)       Percocet 10 every 4 when necessary--we discontinued all pain medications on 7/10 Leukocytosis 20 range- Blood cultures 2 each from 6/26 and 6/29:  NGTD. Chest x-ray 6/30: No acute disease. Incentive spirometry. On discharge white count was in the 12 range No anticoagulation for splenic thrombosis Creon tid Continue Lovenox for DVT prophylaxis  Expect she will do very well going forward  The patient discontinued her NG tube on 7/10 and was eating chicken nuggets and drinking lemonade without any issue or pain. Malnutrition Electrolytes (hypokalemia, hypomagnesemia, hypophosphatemia):  micronutrients given her history of sleeve gastrectomy --would replace as per  dietitian        Will need out patient labs and will need nutritionist input   should follow-up with her bariatric surgeon as per their protocol-cc [name] Pulling on this note Painful urination without signs and symptoms of urinary infection Leukocytosis:   Secondary to acute pancreatitis.        Resolving       Had a foley d/c 7/5       UA 7/6 neg, pyridium 100 tid x 2 days then STOP--her dysuria is improving with Pyridium and this was  discontinued 7/8  Abnormal LFTs: Hepatitis panel negative. Per GI. Resolved. Acute respiratory failure with hypoxia ohss HABITUS/ OSA:  Related to SIRS, atelectasis, untreated OSA. Incentive spirometry.  Supplemental oxygen as needed.  Patient is unwilling to use CPAP Essential hypertension:   Worsened by acute illness.  Currently on metoprolol 50 MG twice a day, increased to 75 MG twice a day.   Continue when necessary IV labetalol.  Added clonidine 0.2 bid [might also help with anxiety]--blood pressure seems to be stable and relatively well-controlled and her tachycardia is likely secondary to anxiety and is improved Anemia Secondary to critical illness             Stable. Follow CBCs from a distance History of sleeve gastrectomy 02/2016  Morbid obesity/Body mass index is 37.23 kg/m.              Stable at present time and is eating fairly well  Consultants:   PCCM-signed off 7/2 [facility] -to place NG tube and right upper extremity PICC line  Procedures:  Postpyloric NG tube placed on PICC line  Antimicrobials:  Zosyn-discontinued. IV meropenem 6/30 > 7/3  Discharge Exam: Vitals:   04/28/17 0452 04/28/17 1603  BP: (!) 137/98 140/79  Pulse: 100 100  Resp: 20 18  Temp: 98.5 F (36.9 C) 98.7 F (37.1 C)    General: alert pleasant no distress Cardiovascular:  s1 s 2 no m/r/g no tachy Respiratory: clear no added sound abd soft nt-no rebound Anxious++  Discharge Instructions   Discharge Instructions     Diet - low sodium heart healthy    Complete by:  As directed    Discharge instructions    Complete by:  As directed    Would recommend a very close follow-up with gastroenterology group Dr. Rosalie Montgomery who is aware that you'll be discharged and will call you with follow-up appointment I would recommend that if you have nausea vomiting chest pain you should be seen by a physician preferably your gastroenterologist however I do think if you take it slow with diet and have none of these symptoms, you'll be fine and will just need an outpatient CT scan as per the choosing of the gastroenterologist when you talk to him and go to his office You will need lab work in about one week at your primary care physician's office We do not prescribe a medications on discharge   Increase activity slowly    Complete by:  As directed      Current Discharge Medication List    START taking these medications   Details  ALPRAZolam (XANAX) 0.25 MG tablet Take 1 tablet (0.25 mg total) by mouth daily. Qty: 30 tablet, Refills: 0    cloNIDine (CATAPRES) 0.2 MG tablet Take 1 tablet (0.2 mg total) by mouth 2 (two) times daily. Qty: 60 tablet, Refills: 0  ferrous sulfate 300 (60 Fe) MG/5ML syrup Take 5 mLs (300 mg total) by mouth 2 (two) times daily with a meal. Qty: 150 mL, Refills: 3    !! lipase/protease/amylase (CREON) 36000 UNITS CPEP capsule Take 2 capsules (72,000 Units total) by mouth 3 (three) times daily with meals. Qty: 270 capsule, Refills: 0    !! lipase/protease/amylase (CREON) 36000 UNITS CPEP capsule Take 1 capsule (36,000 Units total) by mouth 3 (three) times daily between meals. Qty: 270 capsule, Refills: 0    magnesium chloride (SLOW-MAG) 64 MG TBEC SR tablet Take 1 tablet (64 mg total) by mouth every morning. Qty: 60 tablet, Refills: 0    Metoprolol Tartrate 75 MG TABS Take 75 mg by mouth 2 (two) times daily. Qty: 60 tablet, Refills: 0    pantoprazole (PROTONIX) 40 MG tablet Take 1 tablet  (40 mg total) by mouth 2 (two) times daily. Qty: 60 tablet, Refills: 0    vitamin B-12 500 MCG tablet Take 1 tablet (500 mcg total) by mouth daily. Qty: 30 tablet, Refills: 0     !! - Potential duplicate medications found. Please discuss with provider.    CONTINUE these medications which have NOT CHANGED   Details  cyclobenzaprine (FLEXERIL) 5 MG tablet Take 1 tablet (5 mg total) by mouth at bedtime. Qty: 90 tablet, Refills: 3   Associated Diagnoses: Bilateral neck pain    gabapentin (NEURONTIN) 300 MG capsule Take 1 capsule (300 mg total) by mouth at bedtime. Qty: 60 capsule, Refills: 11      STOP taking these medications     metoprolol succinate (TOPROL XL) 50 MG 24 hr tablet        Allergies  Allergen Reactions  . Bromocriptine Mesylate Nausea Only  . Depo-Medrol [Methylprednisolone Acetate] Itching and Rash  . Imitrex [Sumatriptan] Rash      The results of significant diagnostics from this hospitalization (including imaging, microbiology, ancillary and laboratory) are listed below for reference.    Significant Diagnostic Studies: Dg Chest 2 View  Result Date: 04/12/2017 CLINICAL DATA:  Patient c/o upper abd pain. Per patient nausea, vomiting w/ bright red blood, and diarrhea. Patient has generalized weakness. History of HTN, Breast mass. EXAM: CHEST  2 VIEW COMPARISON:  01/03/2016 FINDINGS: Cardiac silhouette is top-normal in size. No mediastinal or hilar masses. No evidence of adenopathy. Clear lungs.  No pleural effusion or pneumothorax. Skeletal structures are intact. IMPRESSION: No active cardiopulmonary disease. Electronically Signed   By: Lajean Manes M.D.   On: 04/12/2017 23:52   US Abdomen Complete  Result Date: 04/13/2017 CLINICAL DATA:  Pancreatitis, nausea, vomiting, diarrhea, breast mass EXAM: ABDOMEN ULTRASOUND COMPLETE COMPARISON:  CT abdomen pelvis 04/12/2017 FINDINGS: Gallbladder: Dependent nonshadowing echogenic material within gallbladder lumen  question sludge. Gallbladder wall upper normal thickness 3 mm diameter. No sonographic Murphy sign or definite shadowing calculi. Common bile duct: Diameter: 3 mm diameter , normal Liver: Echogenic, likely fatty infiltration, though this can be seen with cirrhosis and certain infiltrative disorders. Area of focally decreased attenuation within liver adjacent to gallbladder fossa 3.4 x 2.9 x 3.5 cm question fatty sparing versus hypoechoic mass; no definite mass lesion seen at this site on prior CT, favor sparing. IVC: Short segment of intrahepatic IVC normal appearance, intrahepatic portion obscured by bowel gas Pancreas: Thickened and hypoechoic consistent with pancreatitis features seen on CT. No discrete mass, pseudocyst, or ductal dilatation. No shadowing calcification. Portions of the tail and uncinate inner obscured by bowel gas. Spleen: Normal appearance, 5.7 cm length  Right Kidney: Length: 11.1 cm. Normal morphology without mass or hydronephrosis. Left Kidney: Length: 11.8 cm. Normal morphology without mass or hydronephrosis. Abdominal aorta: Normal caliber Other findings: No free fluid IMPRESSION: Enlarged pancreas consistent with pancreatitis features seen on CT. Incomplete visualization of distal pancreatic tail and portions of uncinate. Probable sludge within gallbladder with upper normal gallbladder wall thickness. No definite gallstones or sonographic Murphy sign. Hypoechoic region within liver 3.5 cm greatest size, question focal sparing within a background of fatty infiltration ; no definite hepatic mass was seen at this site on the preceding CT. Electronically Signed   By: Lavonia Dana M.D.   On: 04/13/2017 10:14   Ct Abdomen Pelvis W Contrast  Result Date: 04/25/2017 CLINICAL DATA:  Followup of pancreatitis.  Severe abdominal pain. EXAM: CT ABDOMEN AND PELVIS WITH CONTRAST TECHNIQUE: Multidetector CT imaging of the abdomen and pelvis was performed using the standard protocol following bolus  administration of intravenous contrast. CONTRAST:  181mL ISOVUE-300 IOPAMIDOL (ISOVUE-300) INJECTION 61% COMPARISON:  04/17/2017 FINDINGS: Lower chest: Left worse than right base airspace disease. Minimally improved. Mild cardiomegaly, with similar small left pleural effusion. The right-sided pleural effusion has resolved. Hepatobiliary: Hepatomegaly is marked, 25.5 cm craniocaudal. No focal liver lesion. Contracted gallbladder, without pericholecystic edema or biliary duct dilatation. Pancreas: Re- demonstration of pancreatic necrosis involving the body and neck. Enhancing pancreatic tail and head/ uncinate process remain. Increased organization of peripancreatic fluid collection. Example at 10.7 x 4.0 cm on image 32/series 2. The more ill-defined collections extending anteriorly into the gastrocolic ligament. These either relatively similar. Example at 3.0 cm on image 46/series 2 Spleen: Normal in size, without focal abnormality. Adrenals/Urinary Tract: Normal adrenal glands. Normal kidneys, without hydronephrosis. Posterior bladder wall thickening is mild to moderate with mild mucosal hyperenhancement. Example image 77/series 2. Stomach/Bowel: Surgical changes about the greater curvature of the stomach. Feeding tube terminates in the proximal jejunum. The stomach is displaced anteriorly by the peripancreatic fluid collection. Normal colon, appendix, and terminal ileum. Hyperattenuation in the proximal jejunum on image 38/ series 2 is presumably due to pill ingestion. Vascular/Lymphatic: Normal caliber of the aorta and branch vessels. Splenic vein compressed and likely thrombosed. Portal vein not well evaluated. Mild gastrohepatic ligament adenopathy including at 13 mm on image 26/ series 2. Likely reactive. Reproductive: Normal uterus and adnexa. Other: Trace free pelvic fluid could be physiologic. Musculoskeletal: Disc bulge at L4-5 IMPRESSION: 1. Similar appearance of necrotizing pancreatitis. Peripancreatic  fluid collections are increasingly organizing, most consistent with developing pseudocysts. 2. 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 3. Resolved right pleural effusion. Similar small left pleural effusion with improved bibasilar airspace disease. 4. Bladder wall thickening and mucosal hyperenhancement, suggesting cystitis. 5. Hepatomegaly. Electronically Signed   By: Abigail Miyamoto M.D.   On: 04/25/2017 12:14   Ct Abdomen Pelvis W Contrast  Result Date: 04/13/2017 CLINICAL DATA:  Upper abdominal pain, nausea vomiting and diarrhea EXAM: CT ABDOMEN AND PELVIS WITH CONTRAST TECHNIQUE: Multidetector CT imaging of the abdomen and pelvis was performed using the standard protocol following bolus administration of intravenous contrast. CONTRAST:  174mL ISOVUE-300 IOPAMIDOL (ISOVUE-300) INJECTION 61% COMPARISON:  10/24/2013, mammogram 03/04/2017 FINDINGS: Lower chest: Lung bases demonstrate no acute consolidation or pleural effusion. Mild cardiomegaly. Partially visualized low-density lesion in the lateral aspect of left breast, likely corresponding to history of recent excision. Hepatobiliary: Mild steatosis. No biliary dilatation. Small amount of pericholecystic fluid. No calcified gallstones. Pancreas: Enlarged and edematous. Moderate-to-marked peripancreatic fluid and inflammatory change consistent with acute pancreatitis. No definite  focal fluid collections at the pancreas bed. Spleen: Normal in size without focal abnormality. Adrenals/Urinary Tract: Adrenal glands are unremarkable. Kidneys are normal, without renal calculi, focal lesion, or hydronephrosis. Bladder is unremarkable. Stomach/Bowel: Postsurgical changes of the stomach. Appendix appears normal. No evidence of bowel wall thickening, distention, or inflammatory changes. Vascular/Lymphatic: Normal portal vein and splenic vein enhancement. Non aneurysmal aorta. No significantly enlarged lymph nodes. Reproductive: Uterus and bilateral adnexa are  unremarkable. Other: Small free fluid in the pelvis. Small to moderate fluid in the upper abdomen. No free air. Musculoskeletal: No acute or significant osseous findings. IMPRESSION: 1. Enlarged edematous pancreas with moderate-to-marked fluid and edema surrounding the pancreas consistent with acute pancreatitis. No focal or organized fluid collections are seen at this time. 2. Small amount of pericholecystic fluid, uncertain if this is related to generalized fluid in the upper abdomen from pancreatitis or if this is due to inflammation of the gallbladder. Suggest correlation with gallbladder ultrasound. 3. Small free fluid in the abdomen and pelvis. 4. Fatty liver 5. Focal hypodensity in the lateral aspect of the left breast, felt related to history of recent breast excision. Electronically Signed   By: Donavan Foil M.D.   On: 04/13/2017 00:20   Dg Chest Port 1 View  Result Date: 04/18/2017 CLINICAL DATA:  Pt lethargic, unrousable for imaging instructions. Fever, chills, acute pancreatitis. EXAM: PORTABLE CHEST 1 VIEW COMPARISON:  04/14/2017 FINDINGS: Lung volumes remain low. There is lung base opacity that is most likely atelectasis. No new lung abnormalities. No pneumothorax. Cardiac silhouette is normal in size. No mediastinal or hilar masses. Enteric feeding tube passes below the diaphragm through the distal stomach and below the included field of view. Right PICC is stable, tip projecting in the right atrium. IMPRESSION: 1. No convincing acute cardiopulmonary disease. 2. Low lung volumes with lung base opacity that is most likely atelectasis. Electronically Signed   By: Lajean Manes M.D.   On: 04/18/2017 10:58   Dg Chest Port 1 View  Result Date: 04/14/2017 CLINICAL DATA:  Sepsis. EXAM: PORTABLE CHEST 1 VIEW COMPARISON:  04/12/2017 FINDINGS: 1217 hours. Markedly low lung volumes. The cardio pericardial silhouette is enlarged. Vascular congestion with interstitial opacity the basilar airspace disease  suggests pulmonary edema. A component of the basilar opacity may be atelectatic. Question small bilateral pleural effusions. The visualized bony structures of the thorax are intact. Telemetry leads overlie the chest. IMPRESSION: Markedly low volume film with cardiomegaly, vascular congestion and probable edema. Bibasilar atelectasis with probable small bilateral effusions. Electronically Signed   By: Misty Stanley M.D.   On: 04/14/2017 12:36   Dg Chest Port 1v Same Day  Result Date: 04/14/2017 CLINICAL DATA:  36 y/o  F; PICC line placement. EXAM: PORTABLE CHEST 1 VIEW COMPARISON:  04/14/2017 chest radiograph FINDINGS: Stable cardiac silhouette given projection and technique. Low lung volumes accentuate pulmonary markings. Bibasilar opacities probably represent atelectasis and possible small effusions. Right PICC line tip projects over the cavoatrial junction. No acute osseous abnormality is evident. IMPRESSION: Right PICC line tip projects over cavoatrial junction. Stable low lung volumes and bibasilar opacities probably representing atelectasis and effusions. Electronically Signed   By: Kristine Garbe M.D.   On: 04/14/2017 22:01   Dg Abd Portable 1v  Result Date: 04/27/2017 CLINICAL DATA:  Acute pancreatitis.  Nasogastric tube placement. EXAM: PORTABLE ABDOMEN - 1 VIEW COMPARISON:  CT abdomen and pelvis April 25, 2017 FINDINGS: Nasogastric tube tip projects in proximal jejunum. Included bowel gas pattern is nondilated and nonobstructive.  Soft tissue planes and included osseous structures are nonsuspicious. IMPRESSION: Nasogastric tube tip projects in proximal jejunum. Electronically Signed   By: Elon Alas M.D.   On: 04/27/2017 22:28   Dg Addison Bailey G Tube Plc W/fl W/rad  Result Date: 04/17/2017 INDICATION: Acute necrotizing pancreatitis EXAM: FLOURO RM 1-60 MIN COMPARISON:  CT abdomen and pelvis 04/17/2017 CONTRAST:  None FLUOROSCOPY TIME:  1 minutes. 10 seconds. COMPLICATIONS: None immediate  PROCEDURE: The feeding tube was lubricated with viscous lidocaine inserted into the right nostril. Under intermittent fluoroscopic guidance, the feeding tube was advanced through the stomach, through the duodenum with tipultimately terminating over the expected location of the duodenal - jejunal junction. A spot fluoroscopic image was saved for documentation purposes. The tube was affixed to the patient's nose with tape. The patient tolerated the procedure well without immediate postprocedural complication. FINDINGS: As above IMPRESSION: Successful fluoroscopic guided placement of a feeding tube with tip terminating over the duodenojejunal junction. The tube is ready for immediate use. Electronically Signed   By: Lavonia Dana M.D.   On: 04/17/2017 17:42   Ct Pancreas Abd W/wo  Result Date: 04/17/2017 CLINICAL DATA:  Pancreatitis. EXAM: CT ABDOMEN WITHOUT AND WITH CONTRAST TECHNIQUE: Multidetector CT imaging of the abdomen was performed following the standard protocol before and following the bolus administration of intravenous contrast. CONTRAST:  100 mL Isovue COMPARISON:  None. FINDINGS: Lower chest: Bilateral pleural effusions and dense basilar atelectasis is new from comparison exam. Hepatobiliary: On arterial phase imaging there is a round well-circumscribed focus of hypoperfusion measuring 3.5 cm (image 48, series 4 )which is isodense to the liver on the venous phase imaging. There is additionally hypoperfusion to the more general posterior RIGHT hepatic lobe. These are felt benign vascular profusion anomalies. No enhancing hepatic lesion. No biliary duct dilatation. Small amount sludge within the gallbladder. Gallbladder is nondistended. Pancreas: Pancreas is edematous measuring 4 cm in diameter compared to 3.4 cm on comparison CT. There is hypoperfusion of a large portion of the body the pancreas measuring 4.0 x 8.8 cm (image 56, series 4). Only the head and tail of the pancreas perfuse normally. The  pancreatic duct cannot be followed through this region of non profusion. There is no organized fluid collection. Free fluid extends along the LEFT and LEFT pericolic gutter. The volume of fluid on LEFT and RIGHT is decreased compared to prior. Fluid also extends into the pancreatic gastric hepatic ligament similar to prior. Spleen: There is a filling defect within the splenic vein proximally (Image 45, series 10). The more distal splenic vein is diminished in caliber and difficult to follow. Concern for thrombosis Portal veins are patent. Adrenals/urinary tract: Adrenal glands and kidneys are normal. The ureters and bladder normal. Stomach/Bowel: Stomach is decompressed. Evidence of prior gastric bypass surgery (sleeve bypass) no small bowel dilatation . Vascular/Lymphatic: The no evidence of complication of the celiac trunk or SMA branches. Portal vein is patent. Splenic vein thrombosis of above Musculoskeletal: No aggressive osseous lesion. IMPRESSION: 1. Majority of the pancreatic body is hypoperfused consistent with pancreatic necrosis. Only the tail and head of the pancreas perfuse normally. 2. Mild-to-moderate increased in pancreatic edema compared to prior. 3. Interval decrease in pleural fluid on the LEFT and RIGHT pericolic gutter. 4. No organized fluid collections. 5. Thrombus within the proximal splenic vein. The distal splenic vein is poorly imaged and may be thrombosed. Portal veins are patent 6. New bilateral pleural effusions and dense basilar atelectasis. These results will be called to the ordering  clinician or representative by the Radiologist Assistant, and communication documented in the PACS or zVision Dashboard. Electronically Signed   By: Suzy Bouchard M.D.   On: 04/17/2017 12:07    Microbiology: No results found for this or any previous visit (from the past 240 hour(s)).   Labs: Basic Metabolic Panel:  Recent Labs Lab 04/22/17 0300  04/22/17 0330 04/23/17 0314 04/24/17 0447  04/25/17 0500 04/26/17 0409 04/27/17 0405 04/28/17 0403  NA  --   < >  --  133* 138 136 137 135 138  K  --   < >  --  4.0 3.9 3.9 3.9 3.9 3.5  CL  --   < >  --  99* 104 103 103 102 103  CO2  --   < >  --  25 25 24 27 24 24   GLUCOSE  --   < >  --  164* 148* 155* 160* 168* 136*  BUN  --   < >  --  11 12 12 10 11 11   CREATININE  --   < >  --  0.43* 0.47 0.48 0.46 0.41* 0.50  CALCIUM  --   < >  --  8.4* 8.6* 8.7* 9.0 8.9 9.3  MG  --   --  1.9  --   --   --   --   --  2.0  PHOS 2.6  --   --  3.1 3.2  --   --   --  3.9  < > = values in this interval not displayed. Liver Function Tests:  Recent Labs Lab 04/24/17 0447 04/25/17 0500 04/26/17 0409 04/27/17 0405 04/28/17 0403  AST 23 20 21 19 18   ALT 30 26 25 22 20   ALKPHOS 72 64 65 60 57  BILITOT 0.5 0.5 0.4 0.6 0.5  PROT 7.0 6.9 7.4 7.3 7.4  ALBUMIN 2.8* 2.8* 3.2* 3.2* 3.4*    Recent Labs Lab 04/26/17 0409  LIPASE 67*   No results for input(s): AMMONIA in the last 168 hours. CBC:  Recent Labs Lab 04/24/17 0447 04/25/17 0500 04/26/17 0409 04/27/17 0405 04/28/17 0403  WBC 18.7* 15.8* 14.9* 13.3* 12.5*  HGB 8.3* 8.4* 8.9* 8.7* 9.1*  HCT 25.8* 26.1* 27.9* 27.1* 28.0*  MCV 79.6 78.9 79.3 77.9* 78.4  PLT 666* 786* 893* 887* 890*   Cardiac Enzymes: No results for input(s): CKTOTAL, CKMB, CKMBINDEX, TROPONINI in the last 168 hours. BNP: BNP (last 3 results)  Recent Labs  04/15/17 0507  BNP 66.0    ProBNP (last 3 results) No results for input(s): PROBNP in the last 8760 hours.  CBG:  Recent Labs Lab 04/28/17 0027 04/28/17 0452 04/28/17 0733 04/28/17 1317 04/28/17 1642  GLUCAP 127* 129* 150* 133* 190*       Signed:  Nita Sells MD   [facility] 04/28/2017, 5:56 PM

## 2017-04-28 NOTE — Progress Notes (Signed)
Nutrition Follow-up  DOCUMENTATION CODES:   Obesity unspecified  INTERVENTION:   -Continue Ensure Enlive po BID, each supplement provides 350 kcal and 20 grams of protein -Continue MVI -D/c Prostat as TF on hold at this time. Per MD, plan for d/c of NJT -Encourage PO intake -Will complete 24 hour Calorie Count for 7/10.  RD will continue to monitor  NUTRITION DIAGNOSIS:   Inadequate oral intake related to inability to eat, acute illness, nausea, vomiting, altered GI function as evidenced by meal completion < 50%.  -Improving.  GOAL:   Patient will meet greater than or equal to 90% of their needs  -Progressing.  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 Presented to ED with abdominal pain, nausea, vomiting. Worked up for acute pancreatitis w/o pseudocyst. Admitted for management.   Spoke with MD, who states that the patient consumed pot roast last night. Plan will be to assess intake today and remove NJT tomorrow prior to discharge.   Patient in room with mother at bedside. Pt reports the following intake last night: jello, 1 Ensure, pot roast, potatoes, 1/2 cup of brunswick stew (~800 kcal, 44g protein). This morning the patient is triyng to eat some oatmeal with milk and brown sugar. States she will drink an Ensure later after breakfast. Encouraged patient to drink at least 2 Ensures today.   Spoke with RN after patient visit, pt does not like Prostat. D/c since TF are now on hold and likely discontinued.  Medications: CREON capsule TID with & between meals, SLOW-MAG tablet daily, Multivitamin with minerals daily, Protonix tablet BID, Vitamin B-12 tablet daily, Zinc sulfate capsule daily Labs reviewed: CBGs: 129-150  Diet Order:  Diet Heart Room service appropriate? Yes; Fluid consistency: Thin  Skin:  Reviewed, no issues  Last BM:   7/10  Height:   Ht Readings from Last 1 Encounters:  04/17/17 5\' 4"  (1.626 m)    Weight:   Wt Readings from Last 1 Encounters:  04/28/17 215 lb 11.2 oz (97.8 kg)    Ideal Body Weight:  52.27 kg  BMI:  Body mass index is 37.02 kg/m.  Estimated Nutritional Needs:   Kcal:  1650-1900 (32-36 kcal/kg ibw)  Protein:  90-105g (1.7-2 g/kg IBW)  Fluid:  Per MD  EDUCATION NEEDS:   No education needs identified at this time  Clayton Bibles, MS, RD, LDN Pager: [phone] After Hours Pager: [phone]

## 2017-04-28 NOTE — Progress Notes (Signed)
Abd xray resulted. On  call confirmed that tube is in correct placement and may be used for pt's nocturnal feedings. Ativan also ordered per pt's request to help with her nerves. Upon returning to pt's room to administer medication pt became increasingly anxious and concerned about her tube feedings. Pt refused all medications. Pt educated and all questions and concerns addressed. Will continue to monitor closely.

## 2017-04-28 NOTE — Progress Notes (Signed)
[facility] PROGRESS NOTE Subjective tube feeding is on hold. Patient indicates that she is eating somewhat better. Denies abdominal pain.  Objective: Vital signs in last 24 hours: Temp:  [98.4 F (36.9 C)-98.6 F (37 C)] 98.5 F (36.9 C) (07/10 0452) Pulse Rate:  [83-100] 100 (07/10 0452) Resp:  [18-20] 20 (07/10 0452) BP: (105-137)/(70-98) 137/98 (07/10 0452) SpO2:  [97 %-99 %] 98 % (07/10 0452) Weight:  [97.8 kg (215 lb 11.2 oz)] 97.8 kg (215 lb 11.2 oz) (07/10 0452) Last BM Date: 04/26/17  Intake/Output from previous day: 07/09 0701 - 07/10 0700 In: 10 [I.V.:10] Out: 0  Intake/Output this shift: No intake/output data recorded.  PE: General-- alert and oriented no acute distress  Abdomen-- soft and nontender  Lab Results:  Recent Labs  04/26/17 0409 04/27/17 0405 04/28/17 0403  WBC 14.9* 13.3* 12.5*  HGB 8.9* 8.7* 9.1*  HCT 27.9* 27.1* 28.0*  PLT 893* 887* 890*   BMET  Recent Labs  04/26/17 0409 04/27/17 0405 04/28/17 0403  NA 137 135 138  K 3.9 3.9 3.5  CL 103 102 103  CO2 27 24 24   CREATININE 0.46 0.41* 0.50   LFT  Recent Labs  04/26/17 0409 04/27/17 0405 04/28/17 0403  PROT 7.4 7.3 7.4  AST 21 19 18   ALT 25 22 20   ALKPHOS 65 60 57  BILITOT 0.4 0.6 0.5   PT/INR No results for input(s): LABPROT, INR in the last 72 hours. PANCREAS  Recent Labs  04/26/17 0409  LIPASE 67*         Studies/Results: Dg Abd Portable 1v  Result Date: 04/27/2017 CLINICAL DATA:  Acute pancreatitis.  Nasogastric tube placement. EXAM: PORTABLE ABDOMEN - 1 VIEW COMPARISON:  CT abdomen and pelvis April 25, 2017 FINDINGS: Nasogastric tube tip projects in proximal jejunum. Included bowel gas pattern is nondilated and nonobstructive. Soft tissue planes and included osseous structures are nonsuspicious. IMPRESSION: Nasogastric tube tip projects in proximal jejunum. Electronically Signed   By: Elon Alas M.D.   On: 04/27/2017 22:28     Medications: I have reviewed the patient's current medications.  Assessment/Plan: 1. Acute necrotizing pancreatitis. This is complicated by large pseudocyst and probable splenic vein thrombosis. Etiology is unclear possibly due to passage of gallbladder sludge. The patient is tolerating oral intake and does not seem to be in any pain. If she takes an adequate liquid and calories, she could potentially be discharged and followed his outpatient. After the cyst has matured, it will need to be drained either percutaneously or via a [place].  Lochlann Mastrangelo JR,Adianna Darwin L 04/28/2017, 9:12 AM  This note was created using voice recognition software. Minor errors may Have occurred unintentionally.  Pager: [phone] If no answer or after hours call [phone]

## 2017-04-29 LAB — CBC
HCT: 27.9 % — ABNORMAL LOW (ref 36.0–46.0)
Hemoglobin: 8.9 g/dL — ABNORMAL LOW (ref 12.0–15.0)
MCH: 25 pg — ABNORMAL LOW (ref 26.0–34.0)
MCHC: 31.9 g/dL (ref 30.0–36.0)
MCV: 78.4 fL (ref 78.0–100.0)
Platelets: 803 10*3/uL — ABNORMAL HIGH (ref 150–400)
RBC: 3.56 MIL/uL — ABNORMAL LOW (ref 3.87–5.11)
RDW: 13.8 % (ref 11.5–15.5)
WBC: 10.5 10*3/uL (ref 4.0–10.5)

## 2017-04-29 LAB — COMPREHENSIVE METABOLIC PANEL
ALT: 19 U/L (ref 14–54)
AST: 17 U/L (ref 15–41)
Albumin: 3.4 g/dL — ABNORMAL LOW (ref 3.5–5.0)
Alkaline Phosphatase: 54 U/L (ref 38–126)
Anion gap: 8 (ref 5–15)
BUN: 12 mg/dL (ref 6–20)
CO2: 26 mmol/L (ref 22–32)
Calcium: 9.2 mg/dL (ref 8.9–10.3)
Chloride: 104 mmol/L (ref 101–111)
Creatinine, Ser: 0.51 mg/dL (ref 0.44–1.00)
GFR calc Af Amer: >60 mL/min (ref >60)
GFR calc non Af Amer: >60 mL/min (ref >60)
Glucose, Bld: 125 mg/dL — ABNORMAL HIGH (ref 65–99)
Potassium: 3.9 mmol/L (ref 3.5–5.1)
Sodium: 138 mmol/L (ref 135–145)
Total Bilirubin: 0.8 mg/dL (ref 0.3–1.2)
Total Protein: 7.5 g/dL (ref 6.5–8.1)

## 2017-04-29 LAB — GLUCOSE, CAPILLARY
Glucose-Capillary: 128 mg/dL — ABNORMAL HIGH (ref 65–99)
Glucose-Capillary: 167 mg/dL — ABNORMAL HIGH (ref 65–99)
Glucose-Capillary: 132 mg/dL — ABNORMAL HIGH (ref 65–99)

## 2017-04-29 MED ORDER — ALPRAZOLAM 0.25 MG PO TABS: 0.2500 mg | ORAL_TABLET | Freq: Every evening | ORAL | 0 refills | Status: DC | PRN

## 2017-04-29 NOTE — Progress Notes (Signed)
Nutrition Note  Diet: heart healthy Supplements: Ensure Enlive po BID, each supplement provides 350 kcal and 20 grams of protein  7/10: Breakfast: minimal intake Lunch: - not documented Dinner: 305 kcal, 14g protein Supplements: 700 kcal, 40g protein  **Suspect additional kcal consumption from fluid intakes such as juice and lemonades.  Total intake: 1005 kcal (61% of minimum estimated needs)  54g protein (60% of minimum estimated needs)  Nutrition Dx: Inadequate oral intake related to inability to eat, acute illness, nausea, vomiting, altered GI function as evidenced by meal completion < 50%. -improved.  Goal: Pt to meet >/= 90% of their estimated nutrition needs   Intervention:  -Encourage small, frequent meals -Encouraged consumption of protein supplements TID at home -Noted discharge summary has been placed  Clayton Bibles, MS, RD, LDN Pager: [phone] After Hours Pager: [phone]

## 2017-04-29 NOTE — Discharge Instructions (Signed)
Acute Pancreatitis Acute pancreatitis is a condition in which the pancreas suddenly becomes irritated and swollen (has inflammation). The pancreas is a gland that is located behind the stomach. It produces enzymes that help to digest food. The pancreas also releases the hormones glucagon and insulin, which help to regulate blood sugar. Damage to the pancreas occurs when the digestive enzymes from the pancreas are activated before they are released into the intestine. Most acute attacks last a couple of days and can cause serious problems. Some people become dehydrated and develop low blood pressure. In severe cases, bleeding into the pancreas can lead to shock and can be life-threatening. The lungs, heart, and kidneys may fail. What are the causes? The most common causes of this condition are:  Alcohol abuse.  Gallstones.  Other causes include:  Certain medicines.  Exposure to certain chemicals.  Infection.  Damage caused by an accident (trauma).  Abdominal surgery.  In some cases, the cause may not be known. What are the signs or symptoms? Symptoms of this condition include:  Pain in the upper abdomen that may radiate to the back.  Tenderness and swelling of the abdomen.  Nausea and vomiting.  How is this diagnosed? This condition may be diagnosed based on:  A physical exam.  Blood tests.  Imaging tests, such as X-rays, CT scans, or an ultrasound of the abdomen.  How is this treated? Treatment for this condition usually requires a stay in the hospital. Treatment may include:  Pain medicine.  Fluid replacement through an IV tube.  Placing a tube in the stomach to remove stomach contents and to control vomiting (NG tube, or nasogastric tube).  Not eating for 3-4 days. This gives the pancreas a rest, because enzymes are not being produced that can cause further damage.  Antibiotic medicines, if your condition is caused by an infection.  Surgery on the pancreas  or gallbladder.  Follow these instructions at home: Eating and drinking  Follow instructions from your health care provider about diet. This may involve avoiding alcohol and decreasing the amount of fat in your diet.  Eat smaller, more frequent meals. This reduces the amount of digestive fluids that the pancreas produces.  Drink enough fluid to keep your urine clear or pale yellow.  Do not drink alcohol if it caused your condition. General instructions  Take over-the-counter and prescription medicines only as told by your health care provider.  Do not use any tobacco products, such as cigarettes, chewing tobacco, and e-cigarettes. If you need help quitting, ask your health care provider.  Get plenty of rest.  If directed, check your blood sugar at home as told by your health care provider.  Keep all follow-up visits as told by your health care provider. This is important. Contact a health care provider if:  You do not recover as quickly as expected.  You develop new or worsening symptoms.  You have persistent pain, weakness, or nausea.  You recover and then have another episode of pain.  You have a fever. Get help right away if:  You cannot eat or keep fluids down.  Your pain becomes severe.  Your skin or the white part of your eyes turns yellow (jaundice).  You vomit.  You feel dizzy or you faint.  Your blood sugar is high (over 300 mg/dL). This information is not intended to replace advice given to you by your health care provider. Make sure you discuss any questions you have with your health care provider. Document  Released: 10/06/2005 Document Revised: 02/13/2016 Document Reviewed: 07/10/2015 Elsevier Interactive Patient Education  2018 [employer].     Follow these instructions at home:  Take over-the-counter and prescription medicines only as told by your health care provider. This includes any vitamin supplements.  Do not drive or operate heavy  machinery while taking prescription pain medicines.  Drink enough fluid to keep your urine clear or pale yellow.  Do not drink alcohol. If you need help quitting, ask your health care provider.  Do not use any tobacco products, such as cigarettes, chewing tobacco, and e-cigarettes. If you need help quitting, ask your health care provider.  Follow a diet as told by your health care provider or dietitian, if this applies. This may include: ? Limiting how much fat you eat. ? Eating smaller meals more often. ? Avoiding caffeine.  Keep all follow-up visits as told by your health care provider. This is important. Contact a health care provider if:  You have pain that does not get better with medicine.  You have a fever. Get help right away if:  Your pain suddenly gets worse.  You have sudden abdominal swelling.  You start to vomit often or you vomit blood.  You have diarrhea that does not go away.  You have blood in your stool. This information is not intended to replace advice given to you by your health care provider. Make sure you discuss any questions you have with your health care provider. Document Released: 11/02/2015 Document Revised: 03/13/2016 Document Reviewed: 09/13/2014 Elsevier Interactive Patient Education  [employer].

## 2017-04-29 NOTE — Progress Notes (Signed)
Spoke with pt concerning HH. Pt states that she will not need HH at present time.

## 2017-05-07 ENCOUNTER — Other Ambulatory Visit: Payer: Self-pay | Admitting: Gastroenterology

## 2017-05-07 DIAGNOSIS — K8689 Other specified diseases of pancreas: Secondary | ICD-10-CM | POA: Diagnosis not present

## 2017-05-07 DIAGNOSIS — I82891 Chronic embolism and thrombosis of other specified veins: Secondary | ICD-10-CM | POA: Diagnosis not present

## 2017-05-07 DIAGNOSIS — Z8719 Personal history of other diseases of the digestive system: Secondary | ICD-10-CM | POA: Diagnosis not present

## 2017-05-26 ENCOUNTER — Ambulatory Visit (INDEPENDENT_AMBULATORY_CARE_PROVIDER_SITE_OTHER): Payer: 59 | Admitting: Adult Health

## 2017-05-26 ENCOUNTER — Ambulatory Visit: Payer: 59 | Admitting: Adult Health

## 2017-05-26 ENCOUNTER — Ambulatory Visit
Admission: RE | Admit: 2017-05-26 | Discharge: 2017-05-26 | Disposition: A | Discharge status: Home / Self Care | Payer: 59 | Source: Ambulatory Visit | Attending: Gastroenterology | Admitting: Gastroenterology

## 2017-05-26 ENCOUNTER — Encounter: Payer: Self-pay | Admitting: Adult Health

## 2017-05-26 VITALS — BP 136/86 | HR 68 | Wt 207.0 lb

## 2017-05-26 DIAGNOSIS — G43009 Migraine without aura, not intractable, without status migrainosus: Secondary | ICD-10-CM | POA: Diagnosis not present

## 2017-05-26 DIAGNOSIS — M5481 Occipital neuralgia: Secondary | ICD-10-CM

## 2017-05-26 DIAGNOSIS — M542 Cervicalgia: Secondary | ICD-10-CM

## 2017-05-26 DIAGNOSIS — K8689 Other specified diseases of pancreas: Secondary | ICD-10-CM

## 2017-05-26 DIAGNOSIS — K862 Cyst of pancreas: Secondary | ICD-10-CM | POA: Diagnosis not present

## 2017-05-26 MED ORDER — IOPAMIDOL (ISOVUE-300) INJECTION 61%
100.0000 mL | Freq: Once | INTRAVENOUS | Status: AC | PRN
  Administered 2017-05-26: 100 mL via INTRAVENOUS

## 2017-05-26 MED ORDER — CYCLOBENZAPRINE HCL 5 MG PO TABS: 5.0000 mg | ORAL_TABLET | Freq: Every day | ORAL | 3 refills | Status: DC

## 2017-05-26 MED ORDER — METOPROLOL TARTRATE 75 MG PO TABS: 75.0000 mg | ORAL_TABLET | Freq: Two times a day (BID) | ORAL | 5 refills | Status: DC

## 2017-05-26 NOTE — Progress Notes (Signed)
PATIENT: Kaylee Montgomery DOB: 1981-08-20  REASON FOR VISIT: follow up- intractable migraine headaches, occipital neuralgia HISTORY FROM: patient  HISTORY OF PRESENT ILLNESS: Today 05/26/17 Kaylee Montgomery is a 36 year old female with a history of intractable migraine headaches. She returns today for follow-up. She states her headaches was doing fairly well until approximately one week ago. She states that she's had a dull headache located in the center of her forehead and had to use Tylenol each day. She states that she is also noticed some of the discomfort in the back of the head returning. She reports that she has not use Flexeril in 2 weeks. She was recently hospitalized in July for pancreatitis. She reports that she is having imaging done today and will have gallbladder surgery within the next month or so. She continues on metoprolol. She returns today for an evaluation.  HISTORY 11/18/16: Kaylee Montgomery is a 36 year old right-handed black female with a history of intractable migraine headache. The patient has improved in the headache frequency, she is now having about 12 headache days a month. The patient is on Toprol 25 mg tablets and she takes Flexeril 5 mg at night and some gabapentin 300 mg at night, she cannot take gabapentin during the day secondary to drowsiness. The patient in the past has been on Imitrex and baclofen as well without complete benefit. The patient got some benefit from propranolol. She is trying to get into exercising, she has dizziness if she stoops over and then comes back up again. She has not had any fainting episode. The patient has some neck stiffness associated with the headache. Overall, she feels that she is doing fairly well. She occasionally will have sharp jabbing pains in the back of the head, she has not had any of these types of pain since mid December 2017.   REVIEW OF SYSTEMS: Out of a complete 14 system review of symptoms, the patient complains only of the  following symptoms, and all other reviewed systems are negative.  Headache, neck pain, neck stiffness, excessive thirst, activity change, appetite change, unexpected weight change  ALLERGIES: Allergies  Allergen Reactions  . Bromocriptine Mesylate Nausea Only  . Depo-Medrol [Methylprednisolone Acetate] Itching and Rash  . Imitrex [Sumatriptan] Rash    HOME MEDICATIONS: Outpatient Medications Prior to Visit  Medication Sig Dispense Refill  . ALPRAZolam (XANAX) 0.25 MG tablet Take 1 tablet (0.25 mg total) by mouth at bedtime as needed for anxiety. 5 tablet 0  . cloNIDine (CATAPRES) 0.2 MG tablet Take 1 tablet (0.2 mg total) by mouth 2 (two) times daily. 60 tablet 0  . cyclobenzaprine (FLEXERIL) 5 MG tablet Take 1 tablet (5 mg total) by mouth at bedtime. 90 tablet 3  . lipase/protease/amylase (CREON) 36000 UNITS CPEP capsule Take 2 capsules (72,000 Units total) by mouth 3 (three) times daily with meals. 270 capsule 0  . lipase/protease/amylase (CREON) 36000 UNITS CPEP capsule Take 1 capsule (36,000 Units total) by mouth 3 (three) times daily between meals. 270 capsule 0  . magnesium chloride (SLOW-MAG) 64 MG TBEC SR tablet Take 1 tablet (64 mg total) by mouth every morning. 60 tablet 0  . Metoprolol Tartrate 75 MG TABS Take 75 mg by mouth 2 (two) times daily. 60 tablet 0  . pantoprazole (PROTONIX) 40 MG tablet Take 1 tablet (40 mg total) by mouth 2 (two) times daily. 60 tablet 0  . vitamin B-12 500 MCG tablet Take 1 tablet (500 mcg total) by mouth daily. 30 tablet 0  .  gabapentin (NEURONTIN) 300 MG capsule Take 1 capsule (300 mg total) by mouth at bedtime. 60 capsule 11  . ferrous sulfate 300 (60 Fe) MG/5ML syrup Take 5 mLs (300 mg total) by mouth 2 (two) times daily with a meal. 150 mL 3   No facility-administered medications prior to visit.     PAST MEDICAL HISTORY: Past Medical History:  Diagnosis Date  . Breast mass 02/2017  . Dental crown present   . Hypertension    states BP  fluctuates; has been on med. x 1 yr.  . Migraines   . Pituitary adenoma ([place])   . Seasonal allergies   . Sleep apnea    no CPAP use in > 6 mos.    PAST SURGICAL HISTORY: Past Surgical History:  Procedure Laterality Date  . BREAST LUMPECTOMY WITH RADIOACTIVE SEED LOCALIZATION Left 03/05/2017   Procedure: LEFT BREAST LUMPECTOMY WITH RADIOACTIVE SEED LOCALIZATION;  Surgeon: Donnie Mesa, MD;  Location: [place];  Service: General;  Laterality: Left;  . CESAREAN SECTION    . LAPAROSCOPIC GASTRIC SLEEVE RESECTION N/A 02/18/2016   Procedure: LAPAROSCOPIC GASTRIC SLEEVE RESECTION, UPPER ENDO;  Surgeon: Greer Pickerel, MD;  Location: [facility];  Service: General;  Laterality: N/A;  . 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 TONSILLECTOMY  age 38    FAMILY HISTORY: Family History  Problem Relation Age of Onset  . Heart disease Father        stent  . Migraines Father   . Migraines Son   . Diabetes Maternal Grandfather   . Colon cancer Neg Hx     SOCIAL HISTORY: Social History   Social History  . Marital status: Single    Spouse name: N/A  . Number of children: 1  . Years of education: N/A   Occupational History  . STUDENT    . [employer]   Social History Main Topics  . Smoking status: Never Smoker  . Smokeless tobacco: Never Used  . Alcohol use 0.0 oz/week     Comment: occasionally, last drink in April 2018   . Drug use: No  . Sexual activity: Not on file   Other Topics Concern  . Not on file   Social History Narrative   Patient is right handed.   Patient drinks 1-2 cups of caffeine daily.      PHYSICAL EXAM  Vitals:   05/26/17 0936  BP: 136/86  Pulse: 68  Weight: 207 lb (93.9 kg)   Body mass index is 35.53 kg/m.  Generalized: Well developed, in no acute distress   Neurological examination  Mentation: Alert oriented to time, place, history  taking. Follows all commands speech and language fluent Cranial nerve II-XII: Pupils were equal round reactive to light. Extraocular movements were full, visual field were full on confrontational test. Facial sensation and strength were normal. Uvula tongue midline. Head turning and shoulder shrug  were normal and symmetric. Motor: The motor testing reveals 5 over 5 strength of all 4 extremities. Good symmetric motor tone is noted throughout.  Sensory: Sensory testing is intact to soft touch on all 4 extremities. No evidence of extinction is noted.  Coordination: Cerebellar testing reveals good finger-nose-finger and heel-to-shin bilaterally.  Gait and station: Gait is normal. Tandem gait is normal. Romberg is negative. No drift is seen.  Reflexes: Deep tendon reflexes are symmetric and normal bilaterally.   DIAGNOSTIC DATA (LABS, IMAGING, TESTING) - I  reviewed patient records, labs, notes, testing and imaging myself where available.  Lab Results  Component Value Date   WBC 10.5 04/29/2017   HGB 8.9 (L) 04/29/2017   HCT 27.9 (L) 04/29/2017   MCV 78.4 04/29/2017   PLT 803 (H) 04/29/2017      Component Value Date/Time   NA 138 04/29/2017 0453   K 3.9 04/29/2017 0453   CL 104 04/29/2017 0453   CO2 26 04/29/2017 0453   GLUCOSE 125 (H) 04/29/2017 0453   BUN 12 04/29/2017 0453   CREATININE 0.51 04/29/2017 0453   CREATININE 0.59 01/10/2015 1617   CALCIUM 9.2 04/29/2017 0453   PROT 7.5 04/29/2017 0453   ALBUMIN 3.4 (L) 04/29/2017 0453   AST 17 04/29/2017 0453   ALT 19 04/29/2017 0453   ALKPHOS 54 04/29/2017 0453   BILITOT 0.8 04/29/2017 0453   GFRNONAA >60 04/29/2017 0453   GFRNONAA >89 01/10/2015 1617   GFRAA >60 04/29/2017 0453   GFRAA >89 01/10/2015 1617   Lab Results  Component Value Date   CHOL 156 04/13/2017   HDL 35 (L) 04/13/2017   LDLCALC 101 (H) 04/13/2017   TRIG 101 04/13/2017   CHOLHDL 4.5 04/13/2017   Lab Results  Component Value Date   HGBA1C 5.7 (H)  04/14/2017   Lab Results  Component Value Date   [id number] 010 04/17/2017   Lab Results  Component Value Date   TSH 1.526 04/12/2017      ASSESSMENT AND PLAN 36 y.o. year old female  has a past medical history of Breast mass (02/2017); Dental crown present; Hypertension; Migraines; Pituitary adenoma ([place]); Seasonal allergies; and Sleep apnea. here with:  1. Migraine headaches 2. Occipital neuralgia 3. Cervicogenic headache  The patient will continue on metoprolol. I will refill Flexeril as well and I have instructed the patient to begin taking it at bedtime again. If this does not relieve her neck pain we can consider trigger point injections. In the past they have also been beneficial for the patient. Patient is amenable to this plan. Patient is having a dull headache however she does not want introduce any new medication at this time due to recent hospitalization of pancreatitis and pending surgery. She will follow-up in 6 months or sooner if needed.     Ward Givens, MSN, NP-C 05/26/2017, 9:35 AM [facility] [address] [place], [place] [address] [phone]

## 2017-05-26 NOTE — Progress Notes (Signed)
I have read the note, and I agree with the clinical assessment and plan.  Taegen Delker KEITH   

## 2017-05-26 NOTE — Patient Instructions (Signed)
Continue Flexeril 5 mg at bedtime. If no improvement we can try trigger point injections If your symptoms worsen or you develop new symptoms please let us know.

## 2017-06-02 ENCOUNTER — Ambulatory Visit (HOSPITAL_COMMUNITY)
Admission: RE | Admit: 2017-06-02 | Discharge: 2017-06-02 | Disposition: A | Discharge status: Home / Self Care | Payer: 59 | Source: Ambulatory Visit | Attending: Family Medicine | Admitting: Family Medicine

## 2017-06-02 ENCOUNTER — Ambulatory Visit (INDEPENDENT_AMBULATORY_CARE_PROVIDER_SITE_OTHER): Payer: 59 | Admitting: Family Medicine

## 2017-06-02 ENCOUNTER — Encounter: Payer: Self-pay | Admitting: Family Medicine

## 2017-06-02 VITALS — HR 112 | Temp 98.2°F | Resp 14 | Ht 64.0 in | Wt 202.2 lb

## 2017-06-02 DIAGNOSIS — R1030 Lower abdominal pain, unspecified: Secondary | ICD-10-CM | POA: Insufficient documentation

## 2017-06-02 DIAGNOSIS — Z9889 Other specified postprocedural states: Secondary | ICD-10-CM | POA: Insufficient documentation

## 2017-06-02 DIAGNOSIS — I10 Essential (primary) hypertension: Secondary | ICD-10-CM | POA: Diagnosis not present

## 2017-06-02 DIAGNOSIS — R1032 Left lower quadrant pain: Secondary | ICD-10-CM | POA: Diagnosis not present

## 2017-06-02 DIAGNOSIS — K863 Pseudocyst of pancreas: Secondary | ICD-10-CM | POA: Diagnosis not present

## 2017-06-02 DIAGNOSIS — F419 Anxiety disorder, unspecified: Secondary | ICD-10-CM

## 2017-06-02 DIAGNOSIS — Z09 Encounter for follow-up examination after completed treatment for conditions other than malignant neoplasm: Secondary | ICD-10-CM

## 2017-06-02 LAB — CBC WITH DIFFERENTIAL/PLATELET
Basophils Absolute: 50 cells/uL (ref 0–200)
Basophils Relative: 1 %
Eosinophils Absolute: 100 cells/uL (ref 15–500)
Eosinophils Relative: 2 %
HCT: 37.5 % (ref 35.0–45.0)
Hemoglobin: 12.2 g/dL (ref 11.7–15.5)
Lymphocytes Relative: 36 %
Lymphs Abs: 1800 cells/uL (ref 850–3900)
MCH: 24.9 pg — ABNORMAL LOW (ref 27.0–33.0)
MCHC: 32.5 g/dL (ref 32.0–36.0)
MCV: 76.5 fL — ABNORMAL LOW (ref 80.0–100.0)
MPV: 8.7 fL (ref 7.5–12.5)
Monocytes Absolute: 350 cells/uL (ref 200–950)
Monocytes Relative: 7 %
Neutro Abs: 2700 cells/uL (ref 1500–7800)
Neutrophils Relative %: 54 %
Platelets: 296 10*3/uL (ref 140–400)
RBC: 4.9 MIL/uL (ref 3.80–5.10)
RDW: 13.9 % (ref 11.0–15.0)
WBC: 5 10*3/uL (ref 3.8–10.8)

## 2017-06-02 MED ORDER — ALPRAZOLAM 0.25 MG PO TABS: ORAL_TABLET | ORAL | 0 refills | Status: DC

## 2017-06-02 NOTE — Patient Instructions (Addendum)
F/u in 2  weeks call if you need me before   cBC and diff, cmp , amylase and ipase , magnesium and phosphorous  today   Abdominal x ray today  Clear liquids today    Please if your abdominal pain returns as severe you will need to go to the ED  I Need to be in touch with gI Doc Start using CPAP   Work excuse for an additional 2 weeks

## 2017-06-03 ENCOUNTER — Other Ambulatory Visit: Payer: Self-pay | Admitting: Family Medicine

## 2017-06-03 ENCOUNTER — Encounter: Payer: Self-pay | Admitting: Family Medicine

## 2017-06-03 DIAGNOSIS — R1030 Lower abdominal pain, unspecified: Secondary | ICD-10-CM

## 2017-06-03 DIAGNOSIS — F419 Anxiety disorder, unspecified: Secondary | ICD-10-CM | POA: Insufficient documentation

## 2017-06-03 DIAGNOSIS — K863 Pseudocyst of pancreas: Secondary | ICD-10-CM | POA: Insufficient documentation

## 2017-06-03 DIAGNOSIS — K862 Cyst of pancreas: Secondary | ICD-10-CM

## 2017-06-03 DIAGNOSIS — Z09 Encounter for follow-up examination after completed treatment for conditions other than malignant neoplasm: Secondary | ICD-10-CM | POA: Insufficient documentation

## 2017-06-03 LAB — MAGNESIUM: Magnesium: 1.7 mg/dL (ref 1.5–2.5)

## 2017-06-03 LAB — COMPLETE METABOLIC PANEL WITH GFR
ALT: 20 U/L (ref 6–29)
AST: 13 U/L (ref 10–30)
Albumin: 4.5 g/dL (ref 3.6–5.1)
Alkaline Phosphatase: 56 U/L (ref 33–115)
BUN: 5 mg/dL — ABNORMAL LOW (ref 7–25)
CO2: 21 mmol/L (ref 20–32)
Calcium: 9.4 mg/dL (ref 8.6–10.2)
Chloride: 105 mmol/L (ref 98–110)
Creat: 0.55 mg/dL (ref 0.50–1.10)
GFR, Est African American: >89 mL/min (ref >=60)
GFR, Est Non African American: >89 mL/min (ref >=60)
Glucose, Bld: 106 mg/dL — ABNORMAL HIGH (ref 65–99)
Potassium: 3.4 mmol/L — ABNORMAL LOW (ref 3.5–5.3)
Sodium: 140 mmol/L (ref 135–146)
Total Bilirubin: 1.1 mg/dL (ref 0.2–1.2)
Total Protein: 7.1 g/dL (ref 6.1–8.1)

## 2017-06-03 LAB — PHOSPHORUS: Phosphorus: 2.9 mg/dL (ref 2.5–4.5)

## 2017-06-03 LAB — AMYLASE: Amylase: 147 U/L — ABNORMAL HIGH (ref 21–101)

## 2017-06-03 LAB — LIPASE: Lipase: 61 U/L — ABNORMAL HIGH (ref 7–60)

## 2017-06-03 NOTE — Assessment & Plan Note (Signed)
Controlled, no change in medication DASH diet and commitment to daily physical activity for a minimum of 30 minutes discussed and encouraged, as a part of hypertension management. The importance of attaining a healthy weight is also discussed.  BP/Weight 06/02/2017 05/26/2017 04/29/2017 03/05/2017 02/17/2017 01/13/2017 0/76/8088  Systolic BP - 110 315 945 859 292 446  Diastolic BP - 86 79 97 88 90 91  Wt. (Lbs) 202.25 207 214 219 - 220 217.5  BMI 34.72 35.53 36.73 36.44 - 37.76 37.33

## 2017-06-03 NOTE — Assessment & Plan Note (Signed)
Hospitalized for acute pancreatitis from 6/24 to 04/28/2017 . Symptoms markedly improved, but reports recurrent flares on approx 4  Occasions of severe abdominal pain, most recent last night, requests extension of work excuse, states pain was as much as an 8. States flares take up to 3 days to resolve

## 2017-06-03 NOTE — Assessment & Plan Note (Signed)
Severe lower abdominal pain s/p prolonged hospitalization for acute pancreatitis , with rebound in LLQ, abdomen, will obtain lab panel and X ray of abdomen Clear liquids, extend work note and get I touch with GI for re eval/ f/u

## 2017-06-03 NOTE — Assessment & Plan Note (Signed)
Increased anxiety following recent prolonged  hospitalization no current indication for mental health treatment , limited script for low dose xanax given

## 2017-06-03 NOTE — Assessment & Plan Note (Signed)
To have enlarged in f/u film from recent hospitalization for acute pancreatitis. Has drainage planned at [facility]

## 2017-06-03 NOTE — Progress Notes (Signed)
Kaylee Montgomery     MRN: [id number]      DOB: 10-Nov-1980   HPI Hospital f/u from 6/24 to 04/28/2017 for acute pancreatitis. States her FMLA papers ran out the day of the visit, but last night she had significant left lower abdominal pain that made her ball up in tears rated between an 8 to 10. States that her stool was more loose than usual yesterday, but denies blood or mucus, and prior to that she was constipated She has nausea, but denies vomiting. Pain at the visit is between 4 to6 , she is terrified of recurrence of pancreatitis, terrified of needing to  Be re hospitalized, and does not feel able to return to work yet ,states she has had approximately 4 flares of similar pain since discharge.'Has appt at [facility] to drain te pancreatic cyst end October, had rept scan of abdomen done after her hospital f/iu which reports enlargement and she states she has heard nothing about the result When I discussed her mental health and anxiety state which  Were mentioned  She sttaes that during prolonged hospitalization, as expected, she had become extremely anxious, and that since she was home she had ised very few of the xanax tablets prescribed and only if she became overwhelmed  ROS Denies recent fever or chills. Denies sinus pressure, nasal congestion, ear pain or sore throat. Denies chest congestion, productive cough or wheezing. Denies chest pains, palpitations and leg swelling    Denies dysuria, frequency, hesitancy or incontinence. Denies joint pain, swelling and limitation in mobility. Denies headaches, seizures, numbness, or tingling.  Denies skin break down or rash.   PE  Pulse (!) 112   Temp 98.2 F (36.8 C) (Other (Comment))   Resp 14   Ht 5\' 4"  (1.626 m)   Wt 202 lb 4 oz (91.7 kg)   SpO2 96%   BMI 34.72 kg/m   Patient alert and oriented and in no cardiopulmonary distress.  HEENT: No facial asymmetry, EOMI,   oropharynx pink and moist.  Neck supple no JVD, no  mass.  Chest: Clear to auscultation bilaterally.  CVS: S1, S2 no murmurs, no S3.Regular rate.  ABD: Soft left lower quadrant tenderness wit rebound.   Ext: No edema  MS: Adequate ROM spine, shoulders, hips and knees.  Skin: Intact, no ulcerations or rash noted.  Psych: Good eye contact, normal affect. Memory intact mildly  Anxious not depressed appearing.  CNS: CN 2-12 intact, power,  normal throughout.no focal deficits noted.   Assessment & Plan  Abdominal pain Severe lower abdominal pain s/p prolonged hospitalization for acute pancreatitis , with rebound in LLQ, abdomen, will obtain lab panel and X ray of abdomen Clear liquids, extend work note and get I touch with GI for re eval/ f/u  Essential hypertension Controlled, no change in medication DASH diet and commitment to daily physical activity for a minimum of 30 minutes discussed and encouraged, as a part of hypertension management. The importance of attaining a healthy weight is also discussed.  BP/Weight 06/02/2017 05/26/2017 04/29/2017 03/05/2017 02/17/2017 01/13/2017 03/21/931  Systolic BP - 355 732 202 542 706 237  Diastolic BP - 86 79 97 88 90 91  Wt. (Lbs) 202.25 207 214 219 - 220 217.5  BMI 34.72 35.53 36.73 36.44 - 37.76 37.33       Anxiety Increased anxiety following recent prolonged  hospitalization no current indication for mental health treatment , limited script for low dose xanax given  Pseudocyst, pancreas To have  enlarged in f/u film from recent hospitalization for acute pancreatitis. Has drainage planned at [facility] discharge follow-up Hospitalized for acute pancreatitis from 6/24 to 04/28/2017 . Symptoms markedly improved, but reports recurrent flares on approx 4  Occasions of severe abdominal pain, most recent last night, requests extension of work excuse, states pain was as much as an 8. States flares take up to 3 days to resolve

## 2017-06-08 DIAGNOSIS — Z8719 Personal history of other diseases of the digestive system: Secondary | ICD-10-CM | POA: Diagnosis not present

## 2017-06-08 DIAGNOSIS — K8689 Other specified diseases of pancreas: Secondary | ICD-10-CM | POA: Diagnosis not present

## 2017-06-08 DIAGNOSIS — K863 Pseudocyst of pancreas: Secondary | ICD-10-CM | POA: Diagnosis not present

## 2017-06-09 ENCOUNTER — Telehealth: Payer: Self-pay | Admitting: Family Medicine

## 2017-06-09 NOTE — Telephone Encounter (Signed)
Faxed

## 2017-06-09 NOTE — Telephone Encounter (Signed)
Nira Conn, [employer], called and left message on nurse line regarding patient. She states that she needs a medical update from Dr. Moshe Cipro regarding patient as she is aware Dr. Moshe Cipro has taken patient out of work for medical reasons. She asks that we fax medical records regarding this to fax# [phone]. She also asks that we provider her with a fax number of our so she can fax [place] a request. Patient claim# [id number] Callback# [phone] [id number]

## 2017-06-11 ENCOUNTER — Telehealth: Payer: Self-pay | Admitting: Gastroenterology

## 2017-06-11 NOTE — Telephone Encounter (Signed)
I called the patient back and offered her SF next available for 10/4. She is saying that she needs to be seen by AUG 31 before she has her surgery. Please advise.

## 2017-06-11 NOTE — Telephone Encounter (Signed)
Kaylee Montgomery, I spoke to [name] and she said OK to schedule this pt. We saw her for weeks in the hospital and we actually sent her to [facility].

## 2017-06-11 NOTE — Telephone Encounter (Signed)
Pt called to make OV with SF. She has never been seen by [place] in the office, but said that [facility] had seen her in the hospital while she was admitted. Pt said that she was sent to [facility], but wants a second opinion from [facility]. I told her that we would need a referral from her PCP if she was going to be a new patient with [place]. She said that [facility] said that she would see her. I told patient that I needed to get with SF and see what she advises and call her back. [phone]

## 2017-06-12 NOTE — Telephone Encounter (Signed)
Pt is aware of OV on Monday at 2pm and I cancelled the Oct OV

## 2017-06-15 ENCOUNTER — Telehealth: Payer: Self-pay

## 2017-06-15 ENCOUNTER — Encounter: Payer: Self-pay | Admitting: Gastroenterology

## 2017-06-15 ENCOUNTER — Ambulatory Visit (INDEPENDENT_AMBULATORY_CARE_PROVIDER_SITE_OTHER): Payer: 59 | Admitting: Gastroenterology

## 2017-06-15 VITALS — BP 135/97 | HR 113 | Temp 98.4°F | Ht 64.0 in | Wt 195.6 lb

## 2017-06-15 DIAGNOSIS — K5900 Constipation, unspecified: Secondary | ICD-10-CM | POA: Diagnosis not present

## 2017-06-15 DIAGNOSIS — K8591 Acute pancreatitis with uninfected necrosis, unspecified: Secondary | ICD-10-CM | POA: Diagnosis not present

## 2017-06-15 NOTE — Telephone Encounter (Signed)
Pt gave me the fax number to [place] today before she left. I faxed the letter to ATTN: Rogelia Boga @ [phone].

## 2017-06-15 NOTE — Progress Notes (Signed)
Referring Provider: [name], MD Primary Care Physician:  [name], MD Primary GI: Dr. Oneida Alar while inpatient at [facility], [facility] at [facility] Complaint  Patient presents with  . Abdominal Pain    hospital f/u, cramping left abd  . Constipation  . Diarrhea    HPI:   Kaylee Montgomery is a 36 y.o. female presenting today with a history of acute necrotizing pancreatitis April 13, 2017 at [facility] and transferred to [facility], requiring prolonged hospitalization. 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 Etiology felt to possible be biliary in nature. Recent CT Aug 7th with large pancreatic pseudocyst and mass effect upon the portal vein which remains patent.   Decreased appetite. No vomiting. Has constipation and nausea with the constipation. BM once a week. Taking Miralax once a day. Given samples of Linzess but this made her cramp up. Given a small dose and the "middle dose". Watery stool with this but didn't feel productive. Has upcoming appt at [facility] for what appears to be an EUS. Unclear if this will be for a cyst gastrostomy, as I only see the letter she has brought. Requesting records from [place] for further clarification. She would like to continue care here and requesting assistance filling out disability forms.   Continues with Creon.   Past Medical History:  Diagnosis Date  . Breast mass 02/2017  . Dental crown present   . Hypertension    states BP fluctuates; has been on med. x 1 yr.  . Migraines   . Pituitary adenoma ([place])   . Seasonal allergies   . Sleep apnea    no CPAP use in > 6 mos.    Past Surgical History:  Procedure Laterality Date  . BREAST LUMPECTOMY WITH RADIOACTIVE SEED LOCALIZATION Left 03/05/2017   Procedure: LEFT BREAST LUMPECTOMY WITH RADIOACTIVE SEED LOCALIZATION;  Surgeon: Donnie Mesa, MD;  Location: [place];  Service: General;  Laterality: Left;  . CESAREAN SECTION    .  LAPAROSCOPIC GASTRIC SLEEVE RESECTION N/A 02/18/2016   Procedure: LAPAROSCOPIC GASTRIC SLEEVE RESECTION, UPPER ENDO;  Surgeon: Greer Pickerel, MD;  Location: [facility];  Service: General;  Laterality: N/A;  . 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 TONSILLECTOMY  age 7    Current Outpatient Prescriptions  Medication Sig Dispense Refill  . ALPRAZolam (XANAX) 0.25 MG tablet One tablet at bedtimeas needed for anxiety 10 tablet 0  . cloNIDine (CATAPRES) 0.2 MG tablet Take 1 tablet (0.2 mg total) by mouth 2 (two) times daily. 60 tablet 0  . cyclobenzaprine (FLEXERIL) 5 MG tablet Take 1 tablet (5 mg total) by mouth at bedtime. 90 tablet 3  . lipase/protease/amylase (CREON) 36000 UNITS CPEP capsule Take 2 capsules (72,000 Units total) by mouth 3 (three) times daily with meals. 270 capsule 0  . lipase/protease/amylase (CREON) 36000 UNITS CPEP capsule Take 1 capsule (36,000 Units total) by mouth 3 (three) times daily between meals. 270 capsule 0  . Metoprolol Tartrate 75 MG TABS Take 75 mg by mouth 2 (two) times daily. 60 tablet 5  . pantoprazole (PROTONIX) 40 MG tablet Take 1 tablet (40 mg total) by mouth 2 (two) times daily. 60 tablet 0  . vitamin B-12 500 MCG tablet Take 1 tablet (500 mcg total) by mouth daily. (Patient not taking: Reported on 06/02/2017) 30 tablet 0   No current facility-administered medications for this visit.  Allergies as of 06/15/2017 - Review Complete 06/15/2017  Allergen Reaction Noted  . Bromocriptine mesylate Nausea Only 07/03/2008  . Depo-medrol [methylprednisolone acetate] Itching and Rash 01/29/2015  . Imitrex [sumatriptan] Rash 03/29/2015    Family History  Problem Relation Age of Onset  . Heart disease Father        stent  . Migraines Father   . Migraines Son   . Diabetes Maternal Grandfather   . Colon cancer Neg Hx     Social History   Social History  . Marital status: Single     Spouse name: N/A  . Number of children: 1  . Years of education: N/A   Occupational History  . STUDENT    . [employer]   Social History Main Topics  . Smoking status: Never Smoker  . Smokeless tobacco: Never Used  . Alcohol use No     Comment: stopped 01/2017  . Drug use: No  . Sexual activity: Not Asked   Other Topics Concern  . None   Social History Narrative   Patient is right handed.   Patient drinks 1-2 cups of caffeine daily.    Review of Systems: As mentioned in HPI   Physical Exam: BP (!) 135/97   Pulse (!) 113   Temp 98.4 F (36.9 C) (Oral)   Ht 5\' 4"  (1.626 m)   Wt 195 lb 9.6 oz (88.7 kg)   BMI 33.57 kg/m  General:   Alert and oriented. No distress noted. Pleasant and cooperative.  Head:  Normocephalic and atraumatic. Eyes:  Conjuctiva clear without scleral icterus. Mouth:  Oral mucosa pink and moist. Good dentition. No lesions. Abdomen:  +BS, soft, mild TTP LUQ, epigastric, and non-distended. No rebound or guarding. No HSM or masses noted. Msk:  Symmetrical without gross deformities. Normal posture. Extremities:  Without edema. Neurologic:  Alert and  oriented x4 Psych:  Alert and cooperative. Normal mood and affect.

## 2017-06-15 NOTE — Telephone Encounter (Signed)
Thanks

## 2017-06-15 NOTE — Patient Instructions (Signed)
Continue 2 capsules of Creon with meals.  For constipation: start taking Amitiza one gelcap WITH FOOD twice a day. Take with food to avoid nausea. I have also given samples of a higher dose of Linzess to try IF you do not like the Amitiza. Linzess is taken before breakfast on an empty stomach. Start with Amitiza first.   I will get the most recent records from [place]. Keep upcoming appointment at [facility]. We will see what the plan is from there.  I will see you in September for follow-up.  Your return to work date as of now is September 11th. We can change this is needed. Email me the disability papers at [email or web address]

## 2017-06-15 NOTE — Telephone Encounter (Addendum)
Called patient TO DISCUSS CONCERNS. FEELS CONSTIPATED. WHEN SHE HAS A BM IT CRAMPS SO BAD(TAKING MEDICINE WATERY WITH CLUMPS). TOOK LINZESS BUT CHANGED AMITIZA TODAY. TROUBLE WITH OTHER GI DOC AND DIFFICULTY FILLING OUT DISABILITY PAPERS AND [employer] [place]. ANXIETY IS SO HIGH. THINKS ITS GETTING BETTER. HAS CYSTGASTROSTOMY? AT [place].  NEEDS DISABILITY UNTIL OCT 1.

## 2017-06-16 ENCOUNTER — Ambulatory Visit: Payer: 59 | Admitting: Family Medicine

## 2017-06-16 NOTE — Assessment & Plan Note (Signed)
Prolonged hospitalization, clinically improved but illness complicated by large pseudocyst. Notes decreased appetite but no vomiting. Abdominal discomfort multifactorial in setting of pseudocyst and constipation. Obtain notes from [facility] and keep appt at [facility] for EUS. Start Amitiza 24 mcg BID. Linzess 290 also provided in case Amitiza is not ideal. Return here Sept 12th for close follow-up. Continue Creon dosing. Will complete disability paperwork with a return to work date of September 10th.

## 2017-06-17 NOTE — Progress Notes (Signed)
cc'ed to pcp °

## 2017-06-18 NOTE — Progress Notes (Signed)
Received outside notes from Dr. Alessandra Bevels to be scanned. Was to follow-up in 4 weeks. She has been referred to [facility] as per already outlined.

## 2017-06-19 DIAGNOSIS — K8689 Other specified diseases of pancreas: Secondary | ICD-10-CM | POA: Diagnosis not present

## 2017-06-19 DIAGNOSIS — K859 Acute pancreatitis without necrosis or infection, unspecified: Secondary | ICD-10-CM | POA: Diagnosis not present

## 2017-06-19 DIAGNOSIS — K829 Disease of gallbladder, unspecified: Secondary | ICD-10-CM | POA: Diagnosis not present

## 2017-06-19 DIAGNOSIS — K862 Cyst of pancreas: Secondary | ICD-10-CM | POA: Diagnosis not present

## 2017-06-19 HISTORY — PX: EUS: SHX5427

## 2017-06-24 ENCOUNTER — Inpatient Hospital Stay (HOSPITAL_COMMUNITY)
Admission: EM | Admit: 2017-06-24 | Discharge: 2017-06-27 | DRG: 439 | Disposition: A | Discharge status: Home / Self Care | Payer: 59 | Attending: Internal Medicine | Admitting: Internal Medicine

## 2017-06-24 ENCOUNTER — Encounter (HOSPITAL_COMMUNITY): Payer: Self-pay

## 2017-06-24 ENCOUNTER — Emergency Department (HOSPITAL_COMMUNITY): Payer: 59

## 2017-06-24 DIAGNOSIS — Z888 Allergy status to other drugs, medicaments and biological substances status: Secondary | ICD-10-CM

## 2017-06-24 DIAGNOSIS — K859 Acute pancreatitis without necrosis or infection, unspecified: Secondary | ICD-10-CM | POA: Diagnosis not present

## 2017-06-24 DIAGNOSIS — K863 Pseudocyst of pancreas: Secondary | ICD-10-CM | POA: Diagnosis present

## 2017-06-24 DIAGNOSIS — I10 Essential (primary) hypertension: Secondary | ICD-10-CM | POA: Diagnosis present

## 2017-06-24 DIAGNOSIS — T402X5A Adverse effect of other opioids, initial encounter: Secondary | ICD-10-CM | POA: Diagnosis not present

## 2017-06-24 DIAGNOSIS — D649 Anemia, unspecified: Secondary | ICD-10-CM | POA: Diagnosis not present

## 2017-06-24 DIAGNOSIS — Z79899 Other long term (current) drug therapy: Secondary | ICD-10-CM

## 2017-06-24 DIAGNOSIS — I748 Embolism and thrombosis of other arteries: Secondary | ICD-10-CM | POA: Diagnosis present

## 2017-06-24 DIAGNOSIS — G43909 Migraine, unspecified, not intractable, without status migrainosus: Secondary | ICD-10-CM | POA: Diagnosis present

## 2017-06-24 DIAGNOSIS — E876 Hypokalemia: Secondary | ICD-10-CM | POA: Diagnosis not present

## 2017-06-24 DIAGNOSIS — L299 Pruritus, unspecified: Secondary | ICD-10-CM | POA: Diagnosis not present

## 2017-06-24 DIAGNOSIS — Z885 Allergy status to narcotic agent status: Secondary | ICD-10-CM | POA: Diagnosis not present

## 2017-06-24 DIAGNOSIS — E162 Hypoglycemia, unspecified: Secondary | ICD-10-CM | POA: Diagnosis not present

## 2017-06-24 DIAGNOSIS — J302 Other seasonal allergic rhinitis: Secondary | ICD-10-CM | POA: Diagnosis present

## 2017-06-24 DIAGNOSIS — K861 Other chronic pancreatitis: Secondary | ICD-10-CM | POA: Diagnosis present

## 2017-06-24 DIAGNOSIS — K8591 Acute pancreatitis with uninfected necrosis, unspecified: Principal | ICD-10-CM | POA: Diagnosis present

## 2017-06-24 DIAGNOSIS — R188 Other ascites: Secondary | ICD-10-CM | POA: Diagnosis present

## 2017-06-24 DIAGNOSIS — K5909 Other constipation: Secondary | ICD-10-CM | POA: Diagnosis present

## 2017-06-24 DIAGNOSIS — K8501 Idiopathic acute pancreatitis with uninfected necrosis: Secondary | ICD-10-CM | POA: Diagnosis not present

## 2017-06-24 DIAGNOSIS — K8502 Idiopathic acute pancreatitis with infected necrosis: Secondary | ICD-10-CM | POA: Diagnosis not present

## 2017-06-24 DIAGNOSIS — G4733 Obstructive sleep apnea (adult) (pediatric): Secondary | ICD-10-CM | POA: Diagnosis not present

## 2017-06-24 DIAGNOSIS — K5900 Constipation, unspecified: Secondary | ICD-10-CM | POA: Diagnosis present

## 2017-06-24 DIAGNOSIS — R7303 Prediabetes: Secondary | ICD-10-CM | POA: Diagnosis present

## 2017-06-24 DIAGNOSIS — R109 Unspecified abdominal pain: Secondary | ICD-10-CM | POA: Diagnosis not present

## 2017-06-24 DIAGNOSIS — K8500 Idiopathic acute pancreatitis without necrosis or infection: Secondary | ICD-10-CM | POA: Diagnosis not present

## 2017-06-24 DIAGNOSIS — Z86718 Personal history of other venous thrombosis and embolism: Secondary | ICD-10-CM

## 2017-06-24 HISTORY — DX: Acute pancreatitis with uninfected necrosis, unspecified: K85.91

## 2017-06-24 LAB — URINALYSIS, ROUTINE W REFLEX MICROSCOPIC
Bilirubin Urine: NEGATIVE
Glucose, UA: NEGATIVE mg/dL
Hgb urine dipstick: NEGATIVE
Ketones, ur: NEGATIVE mg/dL
Leukocytes, UA: NEGATIVE
Nitrite: NEGATIVE
Protein, ur: NEGATIVE mg/dL
Specific Gravity, Urine: 1.013 (ref 1.005–1.030)
pH: 7 (ref 5.0–8.0)

## 2017-06-24 LAB — COMPREHENSIVE METABOLIC PANEL
ALT: 32 U/L (ref 14–54)
AST: 27 U/L (ref 15–41)
Albumin: 4.6 g/dL (ref 3.5–5.0)
Alkaline Phosphatase: 51 U/L (ref 38–126)
Anion gap: 11 (ref 5–15)
BUN: 7 mg/dL (ref 6–20)
CO2: 24 mmol/L (ref 22–32)
Calcium: 9.6 mg/dL (ref 8.9–10.3)
Chloride: 104 mmol/L (ref 101–111)
Creatinine, Ser: 0.52 mg/dL (ref 0.44–1.00)
GFR calc Af Amer: >60 mL/min (ref >60)
GFR calc non Af Amer: >60 mL/min (ref >60)
Glucose, Bld: 141 mg/dL — ABNORMAL HIGH (ref 65–99)
Potassium: 3.3 mmol/L — ABNORMAL LOW (ref 3.5–5.1)
Sodium: 139 mmol/L (ref 135–145)
Total Bilirubin: 1 mg/dL (ref 0.3–1.2)
Total Protein: 7.7 g/dL (ref 6.5–8.1)

## 2017-06-24 LAB — CBC WITH DIFFERENTIAL/PLATELET
Basophils Absolute: 0 10*3/uL (ref 0.0–0.1)
Basophils Relative: 1 %
Eosinophils Absolute: 0.1 10*3/uL (ref 0.0–0.7)
Eosinophils Relative: 2 %
HCT: 39.7 % (ref 36.0–46.0)
Hemoglobin: 12.6 g/dL (ref 12.0–15.0)
Lymphocytes Relative: 41 %
Lymphs Abs: 3 10*3/uL (ref 0.7–4.0)
MCH: 24.7 pg — ABNORMAL LOW (ref 26.0–34.0)
MCHC: 31.7 g/dL (ref 30.0–36.0)
MCV: 77.7 fL — ABNORMAL LOW (ref 78.0–100.0)
Monocytes Absolute: 0.6 10*3/uL (ref 0.1–1.0)
Monocytes Relative: 9 %
Neutro Abs: 3.5 10*3/uL (ref 1.7–7.7)
Neutrophils Relative %: 47 %
Platelets: 364 10*3/uL (ref 150–400)
RBC: 5.11 MIL/uL (ref 3.87–5.11)
RDW: 13.9 % (ref 11.5–15.5)
WBC: 7.3 10*3/uL (ref 4.0–10.5)

## 2017-06-24 LAB — PREGNANCY, URINE: Preg Test, Ur: NEGATIVE

## 2017-06-24 LAB — LIPASE, BLOOD: Lipase: 148 U/L — ABNORMAL HIGH (ref 11–51)

## 2017-06-24 LAB — PHOSPHORUS: Phosphorus: 2.8 mg/dL (ref 2.5–4.6)

## 2017-06-24 LAB — MAGNESIUM: Magnesium: 1.7 mg/dL (ref 1.7–2.4)

## 2017-06-24 MED ORDER — POTASSIUM CHLORIDE IN NACL 20-0.9 MEQ/L-% IV SOLN
INTRAVENOUS | Status: DC
  Administered 2017-06-24 – 2017-06-25 (×2): via INTRAVENOUS

## 2017-06-24 MED ORDER — HYDROMORPHONE HCL 1 MG/ML IJ SOLN
1.0000 mg | Freq: Once | INTRAMUSCULAR | Status: AC
  Administered 2017-06-24: 1 mg via INTRAVENOUS
  Filled 2017-06-24: qty 1

## 2017-06-24 MED ORDER — MAGNESIUM SULFATE 2 GM/50ML IV SOLN
2.0000 g | Freq: Once | INTRAVENOUS | Status: AC
  Administered 2017-06-24: 2 g via INTRAVENOUS
  Filled 2017-06-24: qty 50

## 2017-06-24 MED ORDER — METOPROLOL TARTRATE 5 MG/5ML IV SOLN
5.0000 mg | Freq: Four times a day (QID) | INTRAVENOUS | Status: DC
  Administered 2017-06-24 – 2017-06-26 (×7): 5 mg via INTRAVENOUS
  Filled 2017-06-24 (×7): qty 5

## 2017-06-24 MED ORDER — HYDROMORPHONE HCL 1 MG/ML IJ SOLN
1.0000 mg | INTRAMUSCULAR | Status: DC | PRN
  Administered 2017-06-24 – 2017-06-25 (×2): 1 mg via INTRAVENOUS
  Filled 2017-06-24 (×2): qty 1

## 2017-06-24 MED ORDER — ACETAMINOPHEN 325 MG PO TABS: 650.0000 mg | ORAL_TABLET | Freq: Four times a day (QID) | ORAL | Status: DC | PRN

## 2017-06-24 MED ORDER — HYDROMORPHONE HCL 1 MG/ML IJ SOLN
INTRAMUSCULAR | Status: AC
  Filled 2017-06-24: qty 1

## 2017-06-24 MED ORDER — IOPAMIDOL (ISOVUE-300) INJECTION 61%
100.0000 mL | Freq: Once | INTRAVENOUS | Status: AC | PRN
  Administered 2017-06-24: 100 mL via INTRAVENOUS

## 2017-06-24 MED ORDER — ONDANSETRON HCL 4 MG PO TABS: 4.0000 mg | ORAL_TABLET | Freq: Four times a day (QID) | ORAL | Status: DC | PRN

## 2017-06-24 MED ORDER — ONDANSETRON HCL 4 MG/2ML IJ SOLN
4.0000 mg | Freq: Once | INTRAMUSCULAR | Status: AC
  Administered 2017-06-24: 4 mg via INTRAVENOUS
  Filled 2017-06-24: qty 2

## 2017-06-24 MED ORDER — ONDANSETRON HCL 4 MG/2ML IJ SOLN
4.0000 mg | Freq: Four times a day (QID) | INTRAMUSCULAR | Status: DC | PRN
Start: 2017-06-24 — End: 2017-06-27
  Administered 2017-06-24 – 2017-06-25 (×2): 4 mg via INTRAVENOUS
  Filled 2017-06-24 (×2): qty 2

## 2017-06-24 MED ORDER — PANTOPRAZOLE SODIUM 40 MG IV SOLR
40.0000 mg | INTRAVENOUS | Status: DC
  Administered 2017-06-24 – 2017-06-26 (×3): 40 mg via INTRAVENOUS
  Filled 2017-06-24 (×3): qty 40

## 2017-06-24 MED ORDER — SODIUM CHLORIDE 0.9 % IV BOLUS (SEPSIS)
1000.0000 mL | Freq: Once | INTRAVENOUS | Status: AC
  Administered 2017-06-24: 1000 mL via INTRAVENOUS

## 2017-06-24 MED ORDER — CIPROFLOXACIN IN D5W 400 MG/200ML IV SOLN
400.0000 mg | Freq: Two times a day (BID) | INTRAVENOUS | Status: DC
  Administered 2017-06-24 – 2017-06-26 (×4): 400 mg via INTRAVENOUS
  Filled 2017-06-24 (×4): qty 200

## 2017-06-24 MED ORDER — KETOROLAC TROMETHAMINE 30 MG/ML IJ SOLN
30.0000 mg | Freq: Once | INTRAMUSCULAR | Status: AC
  Administered 2017-06-24: 30 mg via INTRAVENOUS
  Filled 2017-06-24: qty 1

## 2017-06-24 MED ORDER — SODIUM CHLORIDE 0.9 % IV BOLUS (SEPSIS)
2000.0000 mL | Freq: Once | INTRAVENOUS | Status: AC
  Administered 2017-06-24: 2000 mL via INTRAVENOUS

## 2017-06-24 NOTE — ED Notes (Signed)
Gone to CT

## 2017-06-24 NOTE — ED Provider Notes (Signed)
Emergency Department Provider Note   I have reviewed the triage vital signs and the nursing notes.   HISTORY  Chief Complaint Abdominal Pain   HPI Kaylee Montgomery is a 36 y.o. female w/ h/o pancreatitis presents to ER 2/2 left sided abdominal pain. States she woke upa round 1100 this morning with severe right sided pain and not feeling well. Tried tylenol/eating/amitiza without improvement and only worsening in symptoms. Has had some sweats and chills but no measured fevers. No h/o similar episodes. No h/o kidney stones. No known h/o diverticulitis or diverticulosis. No alcohol/drugs/tobacco. No BM since last Thursday. No rash, cough, chest pain, trauma, urinary symptoms. Patient with developing nausea while I am examining her.    Past Medical History:  Diagnosis Date  . Breast mass 02/2017  . Dental crown present   . Hypertension    states BP fluctuates; has been on med. x 1 yr.  . Migraines   . 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 Pituitary adenoma ([place])   . Seasonal allergies   . Sleep apnea    no CPAP use in > 6 mos.    Patient Active Problem List   Diagnosis Date Noted  . Hypokalemia 06/25/2017  . Acute pancreatitis 06/24/2017  . Constipation 06/15/2017  . Anxiety 06/03/2017  . 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 Hospital discharge follow-up 06/03/2017  . Abdominal pain   . Anemia   . Transaminitis   . Necrotizing pancreatitis 04/13/2017  . Dense breast tissue 01/13/2017  . Fatty liver 02/18/2016  . 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 Common migraine with intractable migraine 02/23/2015  . Obstructive sleep apnea 10/19/2013  . Prediabetes 04/30/2013  . Essential hypertension 07/17/2008  . 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 Headache 05/31/2008  . PITUITARY ADENOMA 02/17/2008  . Morbid obesity ([place]) 02/17/2008  . Seasonal and perennial allergic rhinitis 02/17/2008  .  GYNECOMASTIA 02/17/2008    Past Surgical History:  Procedure Laterality Date  . BREAST LUMPECTOMY WITH RADIOACTIVE SEED LOCALIZATION Left 03/05/2017   Procedure: LEFT BREAST LUMPECTOMY WITH RADIOACTIVE SEED LOCALIZATION;  Surgeon: Donnie Mesa, MD;  Location: [facility];  Service: General;  Laterality: Left;  . CESAREAN SECTION    . EUS  06/19/2017   [facility]: 7 x 7.3 cm cyst with necrotic material noted adjacent to the body of the pancreas. FNA without malignancy.  Marland Kitchen LAPAROSCOPIC GASTRIC SLEEVE RESECTION N/A 02/18/2016   Procedure: LAPAROSCOPIC GASTRIC SLEEVE RESECTION, UPPER ENDO;  Surgeon: Greer Pickerel, MD;  Location: [facility];  Service: General;  Laterality: N/A;  . 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 TONSILLECTOMY  age 55      Allergies Bromocriptine mesylate; Depo-medrol [methylprednisolone acetate]; Dilaudid [hydromorphone hcl]; and Imitrex [sumatriptan]  Family History  Problem Relation Age of Onset  . Heart disease Father        stent  . Migraines Father   . Migraines Son   . Diabetes Maternal Grandfather   . Colon cancer Neg Hx     Social History Social History  Substance Use Topics  . Smoking status: Never Smoker  . Smokeless tobacco: Never Used  . Alcohol use No     Comment: never drank regularly or heavily but NO etoh at all since 01/2017    Review of Systems  All other systems negative except as documented in the HPI. All pertinent positives and negatives as reviewed  in the HPI. ____________________________________________  PHYSICAL EXAM:  VITAL SIGNS: ED Triage Vitals  Enc Vitals Group     BP 06/24/17 1453 (!) 133/102     Pulse Rate 06/24/17 1453 (!) 131     Resp 06/24/17 1453 18     Temp 06/24/17 1453 98.2 F (36.8 C)     Temp Source 06/24/17 1453 Oral     SpO2 06/24/17 1453 96 %     Weight 06/24/17 1453 193 lb (87.5 kg)     Height 06/24/17 1453 5\' 4"   (1.626 m)     Head Circumference --      Peak Flow --      Pain Score 06/24/17 1452 9     Pain Loc --      Pain Edu? --      Excl. in [place]? --     Constitutional: Alert and oriented but sleepy.  Eyes: Conjunctivae are normal. PERRL. EOMI. Head: Atraumatic. Nose: No congestion/rhinnorhea. Mouth/Throat: Mucous membranes are moist.  Oropharynx non-erythematous. Neck: No stridor.  No meningeal signs.   Cardiovascular: Tachycardic rate, regular rhythm. Good peripheral circulation. Grossly normal heart sounds.   Respiratory: tachypneic respiratory effort.  No retractions. Lungs CTAB. Gastrointestinal: Soft and tender in LLQ and LUQ, left flank. No rash. No peritonitis. No distention.  Musculoskeletal: No lower extremity tenderness nor edema. No gross deformities of extremities. Neurologic:  Normal speech and language. No gross focal neurologic deficits are appreciated.  Skin:  Skin is warm, dry and intact. No rash noted.  ____________________________________________   LABS (all labs ordered are listed, but only abnormal results are displayed)  Labs Reviewed  CBC WITH DIFFERENTIAL/PLATELET - Abnormal; Notable for the following:       Result Value   MCV 77.7 (*)    MCH 24.7 (*)    All other components within normal limits  COMPREHENSIVE METABOLIC PANEL - Abnormal; Notable for the following:    Potassium 3.3 (*)    Glucose, Bld 141 (*)    All other components within normal limits  LIPASE, BLOOD - Abnormal; Notable for the following:    Lipase 148 (*)    All other components within normal limits  CBC WITH DIFFERENTIAL/PLATELET - Abnormal; Notable for the following:    Hemoglobin 11.2 (*)    HCT 35.1 (*)    MCH 25.1 (*)    All other components within normal limits  COMPREHENSIVE METABOLIC PANEL - Abnormal; Notable for the following:    Potassium 3.2 (*)    Glucose, Bld 104 (*)    BUN <5 (*)    Creatinine, Ser 0.40 (*)    Calcium 8.4 (*)    Total Protein 6.3 (*)    All other  components within normal limits  LIPASE, BLOOD - Abnormal; Notable for the following:    Lipase 63 (*)    All other components within normal limits  GLUCOSE, CAPILLARY - Abnormal; Notable for the following:    Glucose-Capillary 103 (*)    All other components within normal limits  GLUCOSE, CAPILLARY - Abnormal; Notable for the following:    Glucose-Capillary 48 (*)    All other components within normal limits  GLUCOSE, CAPILLARY - Abnormal; Notable for the following:    Glucose-Capillary 163 (*)    All other components within normal limits  GLUCOSE, CAPILLARY - Abnormal; Notable for the following:    Glucose-Capillary 123 (*)    All other components within normal limits  GLUCOSE, CAPILLARY - Abnormal; Notable for the following:  Glucose-Capillary 126 (*)    All other components within normal limits  CBC WITH DIFFERENTIAL/PLATELET - Abnormal; Notable for the following:    Hemoglobin 11.2 (*)    HCT 35.4 (*)    MCH 24.7 (*)    All other components within normal limits  COMPREHENSIVE METABOLIC PANEL - Abnormal; Notable for the following:    Potassium 2.9 (*)    Glucose, Bld 133 (*)    BUN <5 (*)    Calcium 8.7 (*)    All other components within normal limits  GLUCOSE, CAPILLARY - Abnormal; Notable for the following:    Glucose-Capillary 123 (*)    All other components within normal limits  GLUCOSE, CAPILLARY - Abnormal; Notable for the following:    Glucose-Capillary 131 (*)    All other components within normal limits  URINALYSIS, ROUTINE W REFLEX MICROSCOPIC  PREGNANCY, URINE  MAGNESIUM  PHOSPHORUS  MAGNESIUM  PHOSPHORUS  LIPASE, BLOOD   ____________________________________________  EKG   ____________________________________________  EKG Interpretation  Date/Time:  Wednesday June 24 2017 15:22:51 EDT Ventricular Rate:  104 PR Interval:    QRS Duration: 115 QT Interval:  368 QTC Calculation: 484 R Axis:   11 Text Interpretation:  Sinus tachycardia Left  ventricular hypertrophy Baseline wander in lead(s) III QTc appear longer than 500 No significant change since last tracing july 1 Confirmed by Merrily Pew [phone]) on 06/24/2017 4:09:25 PM        RADIOLOGY  No results found. ____________________________________________   PROCEDURES  Procedure(s) performed:   Procedures ____________________________________________   INITIAL IMPRESSION / ASSESSMENT AND PLAN / ED COURSE  Pertinent labs & imaging results that were available during my care of the patient were reviewed by me and considered in my medical decision making (see chart for details).  Recurrent pancreatitis vs kidney stone v abscess v diverticulitis.  - fluids, pain meds, anti-emetics, ct, labs to include lipase  1641: still with pain, but improved. Will dose dilaudid again.  Ct/labs cw pancreatitis. Still tachycardic and still with pain. Plan for continued hydration, pain meds and hospital admission.  ___________________________________________  FINAL CLINICAL IMPRESSION(S) / ED DIAGNOSES  Final diagnoses:  None    MEDICATIONS GIVEN DURING THIS VISIT:  Medications  HYDROmorphone (DILAUDID) 1 MG/ML injection (not administered)  ondansetron (ZOFRAN) tablet 4 mg ( Oral See Alternative 06/25/17 0551)    Or  ondansetron (ZOFRAN) injection 4 mg (4 mg Intravenous Given by Other 06/25/17 0551)  pantoprazole (PROTONIX) injection 40 mg (40 mg Intravenous Given 06/25/17 2002)  ciprofloxacin (CIPRO) IVPB 400 mg (400 mg Intravenous New Bag/Given 06/26/17 0827)  metoprolol tartrate (LOPRESSOR) injection 5 mg (5 mg Intravenous Given 06/26/17 0826)  acetaminophen (TYLENOL) tablet 650 mg (not administered)  gabapentin (NEURONTIN) capsule 300 mg (300 mg Oral Given 06/26/17 0826)  lubiprostone (AMITIZA) capsule 24 mcg (24 mcg Oral Given 06/26/17 0830)  diphenhydrAMINE (BENADRYL) capsule 25 mg (25 mg Oral Given 06/25/17 0225)  enoxaparin (LOVENOX) injection 40 mg (40 mg Subcutaneous Given 06/26/17  0827)  hydrOXYzine (ATARAX/VISTARIL) tablet 25 mg (25 mg Oral Given 06/25/17 1043)  fentaNYL (SUBLIMAZE) injection 25-50 mcg (25 mcg Intravenous Given 06/26/17 0147)  lipase/protease/amylase (CREON) capsule 36,000 Units (36,000 Units Oral Given 06/26/17 0826)  0.9 % NaCl with KCl 40 mEq / L  infusion (75 mL/hr Intravenous New Bag/Given 06/26/17 0901)  potassium chloride SA (K-DUR,KLOR-CON) CR tablet 40 mEq (40 mEq Oral Given 06/26/17 0917)  sodium chloride 0.9 % bolus 2,000 mL (2,000 mLs Intravenous Transfusing/Transfer 06/24/17 2005)  HYDROmorphone (DILAUDID)  injection 1 mg (1 mg Intravenous Given 06/24/17 1534)  ondansetron (ZOFRAN) injection 4 mg (4 mg Intravenous Given 06/24/17 1533)  iopamidol (ISOVUE-300) 61 % injection 100 mL (100 mLs Intravenous Contrast Given 06/24/17 1650)  HYDROmorphone (DILAUDID) injection 1 mg (1 mg Intravenous Given 06/24/17 1708)  sodium chloride 0.9 % bolus 1,000 mL (1,000 mLs Intravenous Transfusing/Transfer 06/24/17 2006)  HYDROmorphone (DILAUDID) injection 1 mg (1 mg Intravenous Given 06/24/17 1853)  magnesium sulfate IVPB 2 g 50 mL (0 g Intravenous Stopped 06/25/17 0044)  ketorolac (TORADOL) 30 MG/ML injection 30 mg (30 mg Intravenous Given 06/24/17 2007)  dextrose 50 % solution (25 mLs  Given 06/25/17 0654)  diphenhydrAMINE (BENADRYL) injection 25 mg (25 mg Intravenous Given 06/25/17 0751)  bisacodyl (DULCOLAX) suppository 10 mg (10 mg Rectal Given 06/25/17 1056)    NEW OUTPATIENT MEDICATIONS STARTED DURING THIS VISIT:  Current Discharge Medication List      Note:  This document was prepared using Dragon voice recognition software and may include unintentional dictation errors.   Merrily Pew, MD 06/26/17 1014

## 2017-06-24 NOTE — H&P (Signed)
History and Physical    Kaylee Montgomery IZT:[id number] DOB: January 10, 1981 DOA: 06/24/2017  PCP: Kaylee Helper, MD   Patient coming from: Home.  I have personally briefly reviewed patient's old medical records in [place]  Chief Complaint: Sided abdominal pain.  HPI: Kaylee Montgomery is a 36 y.o. female with medical history significant of breast mass, hypertension, migraine headaches, pituitary adenoma, seasonal allergies, sleep apnea not on CPAP who is coming to the emergency department with complaints of left-sided abdominal and left flank pain associated with nausea since this morning.  Per patient, the pain woke her up this morning. She tried to use an ice pack on her left flank, but this did not help. She did not obtain that this was pancreatitis, as the pain was different from before. She tried to eat a bowl of cereal and took 2 Tylenol tablets. This did not improve the pain and she was unable to lie down. She subsequently start walking to get herself distracted from the pain. Associated symptoms are persistent nausea, but no vomiting. The patient also stated that she was diaphoretic and fell a half/when the pain was very intense. She also complains of constipation for 5 days. She denies emesis, melena or hematochezia. She denies alcohol consumption. She denies fever, chills, sore throat, dyspnea, chest pain, palpitations, dysuria, frequency or hematuria. Denies polydipsia, polyuria or blurred vision.  ED Course: Initial vital signs in emergency department temperature 98.2, pulse 131, blood pressure 133/102 mmHg, respirations 18 and O2 sat 96% on room air. She received 3 rounds of hydromorphone 1 mg IVP, Zofran 4 mg IVP 1 and 3000 mL normal saline bolus.  Her workup shows negative urinalysis and urine pregnancy test. WBC is 7.3, hemoglobin 12.6 g/dL and pelvis 364. Her CMP showed a potassium of 3.3 mmol/L and a blood glucose of 141 mg/dL, but he was otherwise unremarkable. Lipase  level was 148. CT scan showed active pancreatitis in LUQ with small-volume no glossitis. Positive prior necrotizing pancreatitis. Her pseudocysts has decreased from size when compared to scan done on 05/26/2017. Please see images and full radiology report for further details.  Review of Systems: As per HPI otherwise 10 point review of systems negative.    Past Medical History:  Diagnosis Date  . Breast mass 02/2017  . Dental crown present   . Hypertension    states BP fluctuates; has been on med. x 1 yr.  . Migraines   . Pituitary adenoma (Kaylee Montgomery)   . Seasonal allergies   . Sleep apnea    no CPAP use in > 6 mos.    Past Surgical History:  Procedure Laterality Date  . BREAST LUMPECTOMY WITH RADIOACTIVE SEED LOCALIZATION Left 03/05/2017   Procedure: LEFT BREAST LUMPECTOMY WITH RADIOACTIVE SEED LOCALIZATION;  Surgeon: Donnie Mesa, MD;  Location: [facility];  Service: General;  Laterality: Left;  . CESAREAN SECTION    . LAPAROSCOPIC GASTRIC SLEEVE RESECTION N/A 02/18/2016   Procedure: LAPAROSCOPIC GASTRIC SLEEVE RESECTION, UPPER ENDO;  Surgeon: Greer Pickerel, MD;  Location: [facility];  Service: General;  Laterality: N/A;  . 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 TONSILLECTOMY  age 67     reports that she has never smoked. She has never used smokeless tobacco. She reports that she does not drink alcohol or use drugs.  Allergies  Allergen Reactions  . Bromocriptine Mesylate Nausea Only  . Depo-Medrol [Methylprednisolone Acetate] Itching and Rash  .  Imitrex [Sumatriptan] Rash    Family History  Problem Relation Age of Onset  . Heart disease Father        stent  . Migraines Father   . Migraines Son   . Diabetes Maternal Grandfather   . Colon cancer Neg Hx     Prior to Admission medications   Medication Sig Start Date End Date Taking? Authorizing Provider  acetaminophen (TYLENOL) 500 MG tablet  Take 1,000 mg by mouth every 6 (six) hours as needed.   Yes [provider]  ALPRAZolam Duanne Moron) 0.25 MG tablet One tablet at bedtimeas needed for anxiety 06/02/17  Yes Kaylee Helper, MD  ciprofloxacin (CIPRO) 500 MG tablet Take 500 mg by mouth 2 (two) times daily.   Yes [provider]  cyclobenzaprine (FLEXERIL) 5 MG tablet Take 1 tablet (5 mg total) by mouth at bedtime. 05/26/17  Yes Ward Givens, NP  gabapentin (NEURONTIN) 300 MG capsule Take 300 mg by mouth 2 (two) times daily.    Yes [provider]  lipase/protease/amylase (CREON) 36000 UNITS CPEP capsule Take 2 capsules (72,000 Units total) by mouth 3 (three) times daily with meals. 04/29/17  Yes Nita Sells, MD  lubiprostone (AMITIZA) 24 MCG capsule Take 24 mcg by mouth 2 (two) times daily with a meal.   Yes [provider]  Metoprolol Tartrate 75 MG TABS Take 75 mg by mouth 2 (two) times daily. 05/26/17  Yes Ward Givens, NP  pantoprazole (PROTONIX) 40 MG tablet Take 1 tablet (40 mg total) by mouth 2 (two) times daily. Patient taking differently: Take 40 mg by mouth daily.  04/28/17  Yes Nita Sells, MD    Physical Exam: Vitals:   06/24/17 1918 06/24/17 2000 06/24/17 2140 06/24/17 2229  BP: (!) 153/105 (!) 144/97 (!) 137/93   Pulse: (!) 112  (!) 105   Resp: 20 (!) 24 20   Temp: 98 F (36.7 C)  (!) 97.5 F (36.4 C)   TempSrc: Oral  Oral   SpO2: 97%  98%   Weight:    90.8 kg (200 lb 1.6 oz)  Height:        Constitutional: NAD, calm, comfortable Eyes: PERRL, lids and conjunctivae normal ENMT: Mucous membranes are mildly dry. Posterior pharynx clear of any exudate or lesions. Neck: Normal, supple, no masses, no thyromegaly Respiratory: Clear to auscultation bilaterally, no wheezing, no crackles. Normal respiratory effort. No accessory muscle use.  Cardiovascular: Regular rate and rhythm, no murmurs / rubs / gallops. No extremity edema. 2+ pedal pulses. No carotid bruits.    Abdomen: BS, soft, positive epigastric, LUQ and left flank tenderness, no guarding/rebound/masses palpated. No hepatosplenomegaly. Bowel sounds positive.  Musculoskeletal: no clubbing / cyanosis. No joint deformity upper and lower extremities. Normal muscle tone.  Skin: no rashes, lesions, ulcers on limited skin exam Neurologic: CN 2-12 grossly intact. Sensation intact, DTR normal. Strength 5/5 in all 4.  Psychiatric: Normal judgment and insight. Alert and oriented x 3. Normal mood.     Labs on Admission: I have personally reviewed following labs and imaging studies  CBC:  Recent Labs Lab 06/24/17 1539  WBC 7.3  NEUTROABS 3.5  HGB 12.6  HCT 39.7  MCV 77.7*  PLT 505   Basic Metabolic Panel:  Recent Labs Lab 06/24/17 1507 06/24/17 1539  NA  --  139  K  --  3.3*  CL  --  104  CO2  --  24  GLUCOSE  --  141*  BUN  --  7  CREATININE  --  0.52  CALCIUM  --  9.6  MG 1.7  --   PHOS 2.8  --    GFR: Estimated Creatinine Clearance: 105 mL/min (by C-G formula based on SCr of 0.52 mg/dL). Liver Function Tests:  Recent Labs Lab 06/24/17 1539  AST 27  ALT 32  ALKPHOS 51  BILITOT 1.0  PROT 7.7  ALBUMIN 4.6    Recent Labs Lab 06/24/17 1539  LIPASE 148*   No results for input(s): AMMONIA in the last 168 hours. Coagulation Profile: No results for input(s): INR, PROTIME in the last 168 hours. Cardiac Enzymes: No results for input(s): CKTOTAL, CKMB, CKMBINDEX, TROPONINI in the last 168 hours. BNP (last 3 results) No results for input(s): PROBNP in the last 8760 hours. HbA1C: No results for input(s): HGBA1C in the last 72 hours. CBG: No results for input(s): GLUCAP in the last 168 hours. Lipid Profile: No results for input(s): CHOL, HDL, LDLCALC, TRIG, CHOLHDL, LDLDIRECT in the last 72 hours. Thyroid Function Tests: No results for input(s): TSH, T4TOTAL, FREET4, T3FREE, THYROIDAB in the last 72 hours. Anemia Panel: No results for input(s): VITAMINB12, FOLATE,  FERRITIN, TIBC, IRON, RETICCTPCT in the last 72 hours. Urine analysis:    Component Value Date/Time   COLORURINE YELLOW 06/24/2017 [place] 06/24/2017 1458   LABSPEC 1.013 06/24/2017 1458   PHURINE 7.0 06/24/2017 1458   GLUCOSEU NEGATIVE 06/24/2017 1458   HGBUR NEGATIVE 06/24/2017 1458   BILIRUBINUR NEGATIVE 06/24/2017 1458   BILIRUBINUR neg 10/24/2013 1255   KETONESUR NEGATIVE 06/24/2017 1458   PROTEINUR NEGATIVE 06/24/2017 1458   UROBILINOGEN 1.0 10/24/2013 1255   UROBILINOGEN 0.2 10/21/2013 0035   NITRITE NEGATIVE 06/24/2017 1458   LEUKOCYTESUR NEGATIVE 06/24/2017 1458    Radiological Exams on Admission: Ct Abdomen Pelvis W Contrast  Result Date: 06/24/2017 CLINICAL DATA:  Abdominal pain.  Diverticulitis suspected. EXAM: CT ABDOMEN AND PELVIS WITH CONTRAST TECHNIQUE: Multidetector CT imaging of the abdomen and pelvis was performed using the standard protocol following bolus administration of intravenous contrast. CONTRAST:  129mL ISOVUE-300 IOPAMIDOL (ISOVUE-300) INJECTION 61% COMPARISON:  05/26/2017 FINDINGS: Lower chest:  No acute finding.  Generous heart size. Hepatobiliary: No focal liver abnormality.No evidence of biliary obstruction or stone. Pancreas: History pancreatitis. Large cyst with subtle patchy internal density in place of the pancreatic body measuring 9 x 5.5 cm, diminished from prior when it measured up to 6.5 cm. No internal gas is noted. Inferiorly, this collection continues around the tip of the pancreatic tail. There is increased fluid in the left paraicolic gutter and left upper quadrant fat, findings of active pancreatitis. 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 Spleen: Unremarkable. Adrenals/Urinary Tract: Negative adrenals. No hydronephrosis or stone. Unremarkable bladder. Stomach/Bowel: Mild colonic wall thickening and pericolonic edema at the splenic flexure which is likely secondary. No visible fistula. No bowel obstruction.  No appendicitis. Postoperative stomach. Vascular/Lymphatic: Splenic vein findings above. No acute vascular finding. No mass or adenopathy. Reproductive:No pathologic findings. Other: Small volume ascites in the pelvis, considered reactive. This finding is increased from prior. Musculoskeletal: No acute abnormalities. IMPRESSION: 1. Findings of active pancreatitis in the left upper quadrant with small volume ascites, new from 05/26/2017. 2. Prior necrotizing pancreatitis with 9 x 5 cm debris containing cyst in place of the body. The collection is mildly decreased from the 05/26/2017, with decreased mass effect on the stomach. 3. 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 Electronically Signed   By: Neva Seat.D.  On: 06/24/2017 17:12    EKG: Independently reviewed Vent. rate 104 BPM PR interval * ms QRS duration 115 ms QT/QTc 368/484 ms P-R-T axes 53 11 39  Assessment/Plan Principal Problem:   Acute pancreatitis Admit to telemetry/inpatient Keep nothing by mouth. Continue IV fluids. Analgesics as needed. Antiemetics as needed. Protonix 40 mg IVP every 24 hours.  Active Problems:   Pseudocyst, pancreas Had it partially drained recently. CT scan showed decrease in size. The patient is supposed to have a follow-up this Friday. She may need to reschedule or proceed from this hospital while admitted    Essential hypertension Metoprolol 5 mg IVP every 6 hours while nothing by mouth. Monitor blood pressure.    Prediabetes Currently nothing by mouth Last hemoglobin A1c 5.7% on June 26 this year. CBG monitoring every 6 hours while nothing by mouth.    Obstructive sleep apnea Currently not using nocturnal CPAP.    Constipation Continue Amitiza.    Hypokalemia Replacing through the IV. Supplement magnesium.   DVT prophylaxis: SCDs Code Status: Full code. Family Communication: Her mother and grandmother were present in the room. Disposition Plan: Admit  for IV fluids, pain and nausea control. Consults called:  Admission status: Inpatient/telemetry.   Reubin Milan MD [facility] Pager [phone].  If 7PM-7AM, please contact night-coverage [email or web address] Password [email or web address]  06/24/2017, 11:56 PM

## 2017-06-24 NOTE — ED Triage Notes (Addendum)
Pt c/o left sided abd pain since this morning.  Reports has history of pancreatitis.  Pt also reports has been constipated and taking amitza.  LBM was Thursday.  Denies n/v.  Denies any abnormal vaginal bleeding or discharge, denies any burning with urination.  Pt reports had a laproscopic surgery recently.

## 2017-06-24 NOTE — ED Notes (Signed)
Pt ambulatory to the bathroom 

## 2017-06-25 ENCOUNTER — Encounter (HOSPITAL_COMMUNITY): Payer: Self-pay | Admitting: Gastroenterology

## 2017-06-25 DIAGNOSIS — K8501 Idiopathic acute pancreatitis with uninfected necrosis: Secondary | ICD-10-CM

## 2017-06-25 DIAGNOSIS — K8502 Idiopathic acute pancreatitis with infected necrosis: Secondary | ICD-10-CM

## 2017-06-25 DIAGNOSIS — E876 Hypokalemia: Secondary | ICD-10-CM | POA: Diagnosis present

## 2017-06-25 DIAGNOSIS — K5900 Constipation, unspecified: Secondary | ICD-10-CM

## 2017-06-25 LAB — GLUCOSE, CAPILLARY
Glucose-Capillary: 103 mg/dL — ABNORMAL HIGH (ref 65–99)
Glucose-Capillary: 123 mg/dL — ABNORMAL HIGH (ref 65–99)
Glucose-Capillary: 123 mg/dL — ABNORMAL HIGH (ref 65–99)
Glucose-Capillary: 126 mg/dL — ABNORMAL HIGH (ref 65–99)
Glucose-Capillary: 163 mg/dL — ABNORMAL HIGH (ref 65–99)
Glucose-Capillary: 48 mg/dL — ABNORMAL LOW (ref 65–99)

## 2017-06-25 LAB — COMPREHENSIVE METABOLIC PANEL
ALT: 23 U/L (ref 14–54)
AST: 18 U/L (ref 15–41)
Albumin: 3.7 g/dL (ref 3.5–5.0)
Alkaline Phosphatase: 39 U/L (ref 38–126)
Anion gap: 8 (ref 5–15)
BUN: <5 mg/dL — ABNORMAL LOW (ref 6–20)
CO2: 26 mmol/L (ref 22–32)
Calcium: 8.4 mg/dL — ABNORMAL LOW (ref 8.9–10.3)
Chloride: 104 mmol/L (ref 101–111)
Creatinine, Ser: 0.4 mg/dL — ABNORMAL LOW (ref 0.44–1.00)
GFR calc Af Amer: >60 mL/min (ref >60)
GFR calc non Af Amer: >60 mL/min (ref >60)
Glucose, Bld: 104 mg/dL — ABNORMAL HIGH (ref 65–99)
Potassium: 3.2 mmol/L — ABNORMAL LOW (ref 3.5–5.1)
Sodium: 138 mmol/L (ref 135–145)
Total Bilirubin: 1 mg/dL (ref 0.3–1.2)
Total Protein: 6.3 g/dL — ABNORMAL LOW (ref 6.5–8.1)

## 2017-06-25 LAB — CBC WITH DIFFERENTIAL/PLATELET
Basophils Absolute: 0 10*3/uL (ref 0.0–0.1)
Basophils Relative: 1 %
Eosinophils Absolute: 0.1 10*3/uL (ref 0.0–0.7)
Eosinophils Relative: 2 %
HCT: 35.1 % — ABNORMAL LOW (ref 36.0–46.0)
Hemoglobin: 11.2 g/dL — ABNORMAL LOW (ref 12.0–15.0)
Lymphocytes Relative: 31 %
Lymphs Abs: 1.9 10*3/uL (ref 0.7–4.0)
MCH: 25.1 pg — ABNORMAL LOW (ref 26.0–34.0)
MCHC: 31.9 g/dL (ref 30.0–36.0)
MCV: 78.7 fL (ref 78.0–100.0)
Monocytes Absolute: 0.5 10*3/uL (ref 0.1–1.0)
Monocytes Relative: 8 %
Neutro Abs: 3.6 10*3/uL (ref 1.7–7.7)
Neutrophils Relative %: 58 %
Platelets: 251 10*3/uL (ref 150–400)
RBC: 4.46 MIL/uL (ref 3.87–5.11)
RDW: 13.9 % (ref 11.5–15.5)
WBC: 6.1 10*3/uL (ref 4.0–10.5)

## 2017-06-25 LAB — LIPASE, BLOOD: Lipase: 63 U/L — ABNORMAL HIGH (ref 11–51)

## 2017-06-25 LAB — PHOSPHORUS: Phosphorus: 2.9 mg/dL (ref 2.5–4.6)

## 2017-06-25 LAB — MAGNESIUM: Magnesium: 1.9 mg/dL (ref 1.7–2.4)

## 2017-06-25 MED ORDER — HYDROXYZINE HCL 25 MG PO TABS
25.0000 mg | ORAL_TABLET | Freq: Three times a day (TID) | ORAL | Status: DC | PRN
  Administered 2017-06-25: 25 mg via ORAL
  Filled 2017-06-25: qty 1

## 2017-06-25 MED ORDER — LUBIPROSTONE 24 MCG PO CAPS
24.0000 ug | ORAL_CAPSULE | Freq: Two times a day (BID) | ORAL | Status: DC
  Administered 2017-06-25 – 2017-06-27 (×5): 24 ug via ORAL
  Filled 2017-06-25 (×5): qty 1

## 2017-06-25 MED ORDER — PANCRELIPASE (LIP-PROT-AMYL) 12000-38000 UNITS PO CPEP
36000.0000 [IU] | ORAL_CAPSULE | Freq: Three times a day (TID) | ORAL | Status: DC
  Administered 2017-06-25 – 2017-06-27 (×5): 36000 [IU] via ORAL
  Filled 2017-06-25 (×5): qty 3

## 2017-06-25 MED ORDER — FENTANYL CITRATE (PF) 100 MCG/2ML IJ SOLN
25.0000 ug | INTRAMUSCULAR | Status: DC | PRN
  Administered 2017-06-25 – 2017-06-26 (×7): 25 ug via INTRAVENOUS
  Filled 2017-06-25 (×8): qty 2

## 2017-06-25 MED ORDER — DIPHENHYDRAMINE HCL 50 MG/ML IJ SOLN
25.0000 mg | Freq: Once | INTRAMUSCULAR | Status: AC
  Administered 2017-06-25: 25 mg via INTRAVENOUS
  Filled 2017-06-25: qty 1

## 2017-06-25 MED ORDER — KCL IN DEXTROSE-NACL 40-5-0.9 MEQ/L-%-% IV SOLN: INTRAVENOUS | Status: DC

## 2017-06-25 MED ORDER — ENOXAPARIN SODIUM 40 MG/0.4ML ~~LOC~~ SOLN
40.0000 mg | SUBCUTANEOUS | Status: DC
  Administered 2017-06-25 – 2017-06-27 (×3): 40 mg via SUBCUTANEOUS
  Filled 2017-06-25 (×3): qty 0.4

## 2017-06-25 MED ORDER — DIPHENHYDRAMINE HCL 25 MG PO CAPS
25.0000 mg | ORAL_CAPSULE | Freq: Four times a day (QID) | ORAL | Status: DC | PRN
  Administered 2017-06-25: 25 mg via ORAL
  Filled 2017-06-25: qty 1

## 2017-06-25 MED ORDER — DEXTROSE 50 % IV SOLN
INTRAVENOUS | Status: AC
  Administered 2017-06-25: 25 mL
  Filled 2017-06-25: qty 50

## 2017-06-25 MED ORDER — KCL IN DEXTROSE-NACL 20-5-0.9 MEQ/L-%-% IV SOLN
INTRAVENOUS | Status: DC
  Administered 2017-06-25 – 2017-06-26 (×3): via INTRAVENOUS

## 2017-06-25 MED ORDER — PANCRELIPASE (LIP-PROT-AMYL) 12000-38000 UNITS PO CPEP: 72000.0000 [IU] | ORAL_CAPSULE | Freq: Three times a day (TID) | ORAL | Status: DC

## 2017-06-25 MED ORDER — GABAPENTIN 300 MG PO CAPS
300.0000 mg | ORAL_CAPSULE | Freq: Two times a day (BID) | ORAL | Status: DC
  Administered 2017-06-25 – 2017-06-27 (×5): 300 mg via ORAL
  Filled 2017-06-25 (×6): qty 1

## 2017-06-25 MED ORDER — BISACODYL 10 MG RE SUPP
10.0000 mg | Freq: Once | RECTAL | Status: AC
  Administered 2017-06-25: 10 mg via RECTAL
  Filled 2017-06-25: qty 1

## 2017-06-25 NOTE — Consult Note (Signed)
Referring Provider: Reyne Dumas, MD Primary Care Physician:  [name], MD Primary Gastroenterologist:  Barney Drain, MD  Reason for Consultation:  Complicated pancreatitis  HPI: Kaylee Montgomery is a 36 y.o. female with history of acute necrotizing pancreatitis in 03/2017 at [facility], patient transferred to [facility], requiring prolonged hospitalization. 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 CT 05/26/17 with large pancreatic pseudocyst and mass effect upon the portal vein which remains patient.   Patient has seen [facility] during hospitalization and as follow up but she wants to continue her care here locally. She was seen at [facility] 06/19/17 and underwent EUS with FNA. 7 x 7.3 cm discrete anechoic lesion, consistent with a cyst adjacent to the body of the pancreas. Necrotic material noted in the cyst. Unable to determine if it communicated with the main pancreatic duct. FNA with no evidence of malignancy. Plans for endoscopic necrosectomy for tomorrow.  Patient presented to the emergency department with worsening left-sided crampy pain. She has had increasing difficulty with constipation and felt her symptoms were related to that. Has failed MiraLAX. Tried Linzess but caused her to "cramp up". On August 27 seen in our office and given an Amitiza. Last bowel movement 1 week ago.  Patient woke up yesterday morning with worsening of left sided abd pain. Didn't feel the same as with pancreatitis as before. Associated with nausea but no vomiting. No melena, brbpr. No fever, dysuria. No heartburn.   CT scan pelvis with contrast yesterday showed large cyst with subtle patchy internal density in place of the pancreatic body measuring 9 x 5.5 cm, diminished from prior study on August 7 at which time it measured 6.5 cm. No internal gas. Inferiorly the collection continued around the tip of the pancreatic tail. 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 Mild colonic wall thickening and pericolonic edema at the splenic flexure likely secondary. Findings of active pancreatitis in the left upper quadrant with small volume ascites new from 05/26/2017.  Labs as outlined below. Lipase minimally elevated yesterday, 148. Lipase today is 63.  Patient's main complaint today is diffuse itching present since first dose of pain medication in the ED. Intensifies after each additional dose of Dilaudid since then.    Prior to Admission medications   Medication Sig Start Date End Date Taking? Authorizing Provider  acetaminophen (TYLENOL) 500 MG tablet Take 1,000 mg by mouth every 6 (six) hours as needed.   Yes [provider]  ALPRAZolam Duanne Moron) 0.25 MG tablet One tablet at bedtimeas needed for anxiety 06/02/17  Yes [name], MD  ciprofloxacin (CIPRO) 500 MG tablet Take 500 mg by mouth 2 (two) times daily.   Yes [provider]  cyclobenzaprine (FLEXERIL) 5 MG tablet Take 1 tablet (5 mg total) by mouth at bedtime. 05/26/17  Yes Ward Givens, NP  gabapentin (NEURONTIN) 300 MG capsule Take 300 mg by mouth 2 (two) times daily.    Yes [provider]  lipase/protease/amylase (CREON) 36000 UNITS CPEP capsule Take 2 capsules (72,000 Units total) by mouth 3 (three) times daily with meals. 04/29/17  Yes Nita Sells, MD  lubiprostone (AMITIZA) 24 MCG capsule Take 24 mcg by mouth 2 (two) times daily with a meal.   Yes [provider]  Metoprolol Tartrate 75 MG TABS Take 75 mg by mouth 2 (two) times daily. 05/26/17  Yes Ward Givens, NP  pantoprazole (PROTONIX) 40 MG tablet Take 1 tablet (40 mg total)  by mouth 2 (two) times daily. Patient taking differently: Take 40 mg by mouth daily.  04/28/17  Yes Nita Sells, MD    Current Facility-Administered Medications  Medication Dose Route Frequency Provider Last Rate Last Dose  . acetaminophen (TYLENOL) tablet 650 mg   650 mg Oral Q6H PRN Reubin Milan, MD      . ciprofloxacin (CIPRO) IVPB 400 mg  400 mg Intravenous Q12H Reubin Milan, MD   Stopped at 06/24/17 2303  . dextrose 5 % and 0.9 % NaCl with KCl 20 mEq/L infusion   Intravenous Continuous Abrol, Ascencion Dike, MD      . diphenhydrAMINE (BENADRYL) capsule 25 mg  25 mg Oral Q6H PRN Orvan Falconer, MD   25 mg at 06/25/17 0225  . enoxaparin (LOVENOX) injection 40 mg  40 mg Subcutaneous Q24H Abrol, Nayana, MD      . gabapentin (NEURONTIN) capsule 300 mg  300 mg Oral BID Reubin Milan, MD      . HYDROmorphone (DILAUDID) injection 1 mg  1 mg Intravenous Q3H PRN Reubin Milan, MD   1 mg at 06/25/17 0543  . lipase/protease/amylase (CREON) capsule 72,000 Units  72,000 Units Oral TID WC Reubin Milan, MD      . lubiprostone [facility]) capsule 24 mcg  24 mcg Oral BID WC Reubin Milan, MD      . metoprolol tartrate (LOPRESSOR) injection 5 mg  5 mg Intravenous Q6H Reubin Milan, MD   5 mg at 06/25/17 0454  . ondansetron (ZOFRAN) tablet 4 mg  4 mg Oral Q6H PRN Reubin Milan, MD       Or  . ondansetron [facility]) injection 4 mg  4 mg Intravenous Q6H PRN Reubin Milan, MD   4 mg at 06/25/17 0551  . pantoprazole (PROTONIX) injection 40 mg  40 mg Intravenous Q24H Reubin Milan, MD   40 mg at 06/24/17 2214    Allergies as of 06/24/2017 - Review Complete 06/24/2017  Allergen Reaction Noted  . Bromocriptine mesylate Nausea Only 07/03/2008  . Depo-medrol [methylprednisolone acetate] Itching and Rash 01/29/2015  . Imitrex [sumatriptan] Rash 03/29/2015    Past Medical History:  Diagnosis Date  . Breast mass 02/2017  . Dental crown present   . Hypertension    states BP fluctuates; has been on med. x 1 yr.  . Migraines   . Pituitary adenoma ([place])   . Seasonal allergies   . Sleep apnea    no CPAP use in > 6 mos.    Past Surgical History:  Procedure Laterality Date  . BREAST LUMPECTOMY WITH RADIOACTIVE SEED LOCALIZATION  Left 03/05/2017   Procedure: LEFT BREAST LUMPECTOMY WITH RADIOACTIVE SEED LOCALIZATION;  Surgeon: Donnie Mesa, MD;  Location: [place];  Service: General;  Laterality: Left;  . CESAREAN SECTION    . EUS  06/19/2017   [facility]: 7 x 7.3 cm cyst with necrotic material noted adjacent to the body of the pancreas. FNA without malignancy.  Marland Kitchen LAPAROSCOPIC GASTRIC SLEEVE RESECTION N/A 02/18/2016   Procedure: LAPAROSCOPIC GASTRIC SLEEVE RESECTION, UPPER ENDO;  Surgeon: Greer Pickerel, MD;  Location: [facility];  Service: General;  Laterality: N/A;  . 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 TONSILLECTOMY  age 70    Family History  Problem Relation Age of Onset  . Heart disease Father        stent  . Migraines Father   .  Migraines Son   . Diabetes Maternal Grandfather   . Colon cancer Neg Hx     Social History   Social History  . Marital status: Single    Spouse name: N/A  . Number of children: 1  . Years of education: N/A   Occupational History  . STUDENT    . [employer]   Social History Main Topics  . Smoking status: Never Smoker  . Smokeless tobacco: Never Used  . Alcohol use No     Comment: never drank regularly or heavily but NO etoh at all since 01/2017  . Drug use: No  . Sexual activity: Not on file   Other Topics Concern  . Not on file   Social History Narrative   Patient is right handed.   Patient drinks 1-2 cups of caffeine daily.     ROS:  General: Negative for anorexia, weight loss, fever, chills, fatigue, weakness.+++itching Eyes: Negative for vision changes.  ENT: Negative for hoarseness, difficulty swallowing , nasal congestion. CV: Negative for chest pain, angina, palpitations, dyspnea on exertion, peripheral edema.  Respiratory: Negative for dyspnea at rest, dyspnea on exertion, cough, sputum, wheezing.  GI: See history of present illness. GU:   Negative for dysuria, hematuria, urinary incontinence, urinary frequency, nocturnal urination.  MS: Negative for joint pain, low back pain.  Derm: Negative for rash or itching.  Neuro: Negative for weakness, abnormal sensation, seizure, frequent headaches, memory loss, confusion.  Psych: Negative for anxiety, depression, suicidal ideation, hallucinations.  Endo: Negative for unusual weight change.  Heme: Negative for bruising or bleeding. Allergy: Negative for rash or hives.       Physical Examination: Vital signs in last 24 hours: Temp:  [97.5 F (36.4 C)-98.2 F (36.8 C)] 98.2 F (36.8 C) (09/06 0625) Pulse Rate:  [92-131] 92 (09/06 0625) Resp:  [18-24] 20 (09/06 0625) BP: (133-155)/(91-105) 155/103 (09/06 0625) SpO2:  [94 %-99 %] 99 % (09/06 0625) Weight:  [193 lb (87.5 kg)-200 lb 1.6 oz (90.8 kg)] 200 lb 1.6 oz (90.8 kg) (09/05 2229) Last BM Date: 06/18/17  General: Well-nourished, well-developed in no acute distress.  Head: Normocephalic, atraumatic.   Eyes: Conjunctiva pink, no icterus. Mouth: Oropharyngeal mucosa moist and pink , no lesions erythema or exudate. Neck: Supple without thyromegaly, masses, or lymphadenopathy.  Lungs: Clear to auscultation bilaterally.  Heart: Regular rate and rhythm, no murmurs rubs or gallops.  Abdomen: Bowel sounds are normal,   nondistended, no hepatosplenomegaly or masses, no abdominal bruits or    hernia , no rebound or guarding.  Moderate luq tenderness to palpation Rectal: not peformed Extremities: No lower extremity edema, clubbing, deformity.  Neuro: Alert and oriented x 4 , grossly normal neurologically.  Skin: Warm and dry, no rash or jaundice.   Psych: Alert and cooperative, normal mood and affect.        Intake/Output from previous day: 09/05 0701 - 09/06 0700 In: 3263.3 [P.O.:30; I.V.:983.3; IV Piggyback:2250] Out: -  Intake/Output this shift: No intake/output data recorded.  Lab Results: CBC  Recent Labs   06/24/17 1539 06/25/17 0536  WBC 7.3 6.1  HGB 12.6 11.2*  HCT 39.7 35.1*  MCV 77.7* 78.7  PLT 364 251   BMET  Recent Labs  06/24/17 1539 06/25/17 0536  NA 139 138  K 3.3* 3.2*  CL 104 104  CO2 24 26  GLUCOSE 141* 104*  BUN 7 <5*  CREATININE 0.52 0.40*  CALCIUM 9.6 8.4*   LFT  Recent Labs  06/24/17 1539 06/25/17 0536  BILITOT 1.0 1.0  ALKPHOS 51 39  AST 27 18  ALT 32 23  PROT 7.7 6.3*  ALBUMIN 4.6 3.7    Lipase  Recent Labs  06/24/17 1539 06/25/17 0540  LIPASE 148* 63*    PT/INR No results for input(s): LABPROT, INR in the last 72 hours.    Imaging Studies: Ct Abdomen Pelvis W Contrast  Result Date: 06/24/2017 CLINICAL DATA:  Abdominal pain.  Diverticulitis suspected. EXAM: CT ABDOMEN AND PELVIS WITH CONTRAST TECHNIQUE: Multidetector CT imaging of the abdomen and pelvis was performed using the standard protocol following bolus administration of intravenous contrast. CONTRAST:  177mL ISOVUE-300 IOPAMIDOL (ISOVUE-300) INJECTION 61% COMPARISON:  05/26/2017 FINDINGS: Lower chest:  No acute finding.  Generous heart size. Hepatobiliary: No focal liver abnormality.No evidence of biliary obstruction or stone. Pancreas: History pancreatitis. Large cyst with subtle patchy internal density in place of the pancreatic body measuring 9 x 5.5 cm, diminished from prior when it measured up to 6.5 cm. No internal gas is noted. Inferiorly, this collection continues around the tip of the pancreatic tail. There is increased fluid in the left paraicolic gutter and left upper quadrant fat, findings of active pancreatitis. 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 Spleen: Unremarkable. Adrenals/Urinary Tract: Negative adrenals. No hydronephrosis or stone. Unremarkable bladder. Stomach/Bowel: Mild colonic wall thickening and pericolonic edema at the splenic flexure which is likely secondary. No visible fistula. No bowel obstruction. No appendicitis. Postoperative  stomach. Vascular/Lymphatic: Splenic vein findings above. No acute vascular finding. No mass or adenopathy. Reproductive:No pathologic findings. Other: Small volume ascites in the pelvis, considered reactive. This finding is increased from prior. Musculoskeletal: No acute abnormalities. IMPRESSION: 1. Findings of active pancreatitis in the left upper quadrant with small volume ascites, new from 05/26/2017. 2. Prior necrotizing pancreatitis with 9 x 5 cm debris containing cyst in place of the body. The collection is mildly decreased from the 05/26/2017, with decreased mass effect on the stomach. 3. 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 Electronically Signed   By: Monte Fantasia M.D.   On: 06/24/2017 17:12   Ct Abdomen Pelvis W Contrast  Result Date: 05/26/2017 CLINICAL DATA:  Evaluate pancreas.  Followup cysts. EXAM: CT ABDOMEN AND PELVIS WITH CONTRAST TECHNIQUE: Multidetector CT imaging of the abdomen and pelvis was performed using the standard protocol following bolus administration of intravenous contrast. CONTRAST:  165mL ISOVUE-300 IOPAMIDOL (ISOVUE-300) INJECTION 61% COMPARISON:  04/25/2017. FINDINGS: Lower chest: No pleural effusion identified. The lung bases are clear. Hepatobiliary: No focal liver abnormality identified. The gallbladder is normal. No biliary dilatation. Pancreas: Resolving changes of pancreatitis identified. Overall there has been an interval decrease in upper abdominal inflammatory changes secondary to pancreatitis. Large pseudocyst involving the neck body and tail of pancreas appears to have well defined margins measuring 11.2 x 6.6 cm, image 37 of series 2. On the previous exam this measured 10.7 x 4.0 cm in had more ill defined margins. Smaller cyst involving the distal tail of pancreas measures 2 cm, image 33 of series 2. Previously this measured the same. Decrease in central mesenteric phlegmon formation. Currently 1.6 cm, image 47 of series 2. Previously 3  cm. Spleen: Normal in size without focal abnormality. Adrenals/Urinary Tract: Adrenal glands are unremarkable. Kidneys are normal, without renal calculi, focal lesion, or hydronephrosis. Bladder is unremarkable. 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 No pathologic dilatation of the small or large bowel loops. The appendix is visualized and appears normal. Vascular/Lymphatic: Normal appearance  of the abdominal aorta. Large pancreatic pseudocyst has mass effect upon the portal vein which remains patent without evidence for thrombosis. No enlarged retroperitoneal or mesenteric adenopathy. No enlarged pelvic or inguinal lymph nodes. Reproductive: The uterus and adnexal structures are unremarkable Other: A small volume of ascites is noted within the left upper quadrant of the abdomen and in the dependent portion of the pelvis. Musculoskeletal: No acute or significant osseous findings. IMPRESSION: 1. Decrease in inflammatory changes associated with pancreatitis within the upper abdomen. 2. Large pseudo cyst involving the pancreas has increased in size when compared with the previous exam but appears more well defined. No pseudocyst identified. 3. Previously noted mesenteric phlegmon has decreased from prior exam. Electronically Signed   By: Kerby Moors M.D.   On: 05/26/2017 15:05   Dg Abd 2 Views  Result Date: 06/02/2017 CLINICAL DATA:  Left lower quadrant pain. EXAM: ABDOMEN - 2 VIEW COMPARISON:  CT 05/26/2017.  KUB 04/27/2017. FINDINGS: Surgical sutures left upper abdomen. Soft tissue structures are unremarkable . No bowel distention or free air. Right lower calcification noted most consistent with phleboliths. Mild scoliosis concave right. No acute bony abnormality. IMPRESSION: Surgical sutures left upper abdomen. No acute intra-abdominal abnormality identified. No bowel distention. Electronically Signed   By: Marcello Moores  Register   On: 06/02/2017 16:20  [4  week]   Impression: 36 year old female with history of acute necrotizing pancreatitis back in June 2018, prolonged hospitalization, complicated by large cyst formation. Underwent EUS with FNA last Friday with no evidence of malignancy. Is scheduled for endoscopic necrosectomy tomorrow. Patient has had intermittent crampy left-sided pain in the setting of constipation. She thought the two were related. Her last BM was 1 week ago. CT this admission suggest new changes in the past one month with active pancreatitis with ascites in the left upper quadrant. Minimally elevated lipase. Does not look acutely ill at this time. Her pulse has been running in the low 100 range, she is afebrile, normal white count and creatinine.  Her primary concern is diffuse itching which I suspect is related to the Dilaudid.  Constipation poorly managed.  Plan: 1. Discussed with patient's nurse, Tedd Sias, RN, to address pruritus likely due to Dilaudid and request change in pain medication from attending. 2. Nothing by mouth except for meds. Hold Creon until diet advanced. 3. Continue Amitiza for now. Dulcolax suppository.  4. Will continue to follow closely with you.   We would like to thank you for the opportunity to participate in the care of Kaylee Montgomery.  Laureen Ochs. Bernarda Caffey [facility] [phone] 9/6/201810:20 AM     LOS: 1 day

## 2017-06-25 NOTE — Progress Notes (Signed)
Hypoglycemic Event  CBG: 48  Treatment: D50 IV 25 mL  Symptoms: None  Follow-up CBG: Time:0715 CBG Result:163  Possible Reasons for Event: Inadequate meal intake  Comments/MD notified: Dr. Allyson Sabal text paged at [phone]. Jenny Reichmann, RN notified.    Kaylee Montgomery

## 2017-06-25 NOTE — Progress Notes (Addendum)
[facility] PROGRESS NOTE  Kaylee Montgomery NTI:[id number] DOB: [birth year] DOA: 06/24/2017   PCP: [name], MD     Assessment/Plan: Principal Problem:   Acute pancreatitis Active Problems:   Essential hypertension   Prediabetes   Obstructive sleep apnea   Anemia   Pseudocyst, pancreas   Constipation   Hypokalemia  36 year old female with recent admission for acute necrotizing pancreatitis, deemed to be cryptogenic complicated by pseudocyst formation and splenic vein thrombosis, prolonged hospitalization from 6/24-7/10, seen by [facility] gastroenterology, deemed to have possible gallbladder sludge. She also required postpyloric feeding,PCA for pain control, developed mild ARDS during her previous hospitalization  Assessment and plan  Acute pancreatitis with recent pseudocyst formation, of unclear etiology Continue telemetry, npo Keep nothing by mouth. Required postpyloric feeding last admission Continue IV fluids. Analgesics as needed. Antiemetics as needed. Protonix 40 mg IVP every 24 hours. 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 The collection is mildly decreased from the 05/26/2017, with decreased mass effect on the stomach as per CT on  06/24/17  The patient is supposed to have a follow-up this Friday. Followed by [facility] gastroenterology  Has upcoming appt at [facility] for what appears to be an EUS    Essential hypertension Metoprolol 5 mg IVP every 6 hours while nothing by mouth. Monitor blood pressure.    Prediabetes Currently nothing by mouth Last hemoglobin A1c 5.7% on June 26 this year. CBG monitoring every 6 hours while nothing by mouth. Hypoglycemic this morning probably secondary to pancreatitis and started on D5NS      Obstructive sleep apnea Currently not using nocturnal CPAP.    Constipation Continue Amitiza.    Hypokalemia Magnesium 1.7, potassium 3.2, replete    DVT prophylaxsis lovenox   Code Status:   Full code      Family Communication: Discussed in detail with the patient, all imaging results, lab results explained to the patient   Disposition Plan:  3-4 days       Consultants:  GI  Procedures:  None   Antibiotics: Anti-infectives    Start     Dose/Rate Route Frequency Ordered Stop   06/24/17 2200  ciprofloxacin (CIPRO) IVPB 400 mg     400 mg 200 mL/hr over 60 Minutes Intravenous Every 12 hours 06/24/17 2109           HPI/Subjective: Hypoglycemic this morning, also complaining of itching due to dilaudid   Objective: Vitals:   06/24/17 2140 06/24/17 2229 06/25/17 0450 06/25/17 0625  BP: (!) 137/93  (!) 142/91 (!) 155/103  Pulse: (!) 105  93 92  Resp: 20  20 20   Temp: (!) 97.5 F (36.4 C)  98.1 F (36.7 C) 98.2 F (36.8 C)  TempSrc: Oral  Oral Oral  SpO2: 98%  99% 99%  Weight:  90.8 kg (200 lb 1.6 oz)    Height:        Intake/Output Summary (Last 24 hours) at 06/25/17 0831 Last data filed at 06/25/17 0700  Gross per 24 hour  Intake          3263.33 ml  Output                0 ml  Net          3263.33 ml  Exam:  Examination:  General exam: Appears calm and comfortable  Respiratory system: Clear to auscultation. Respiratory effort normal. Cardiovascular system: S1 & S2 heard, RRR. No JVD, murmurs, rubs, gallops or clicks. No pedal edema. Gastrointestinal system: Abdomen is nondistended, soft and nontender. No organomegaly or masses felt. Normal bowel sounds heard. Central nervous system: Alert and oriented. No focal neurological deficits. Extremities: Symmetric 5 x 5 power. Skin: No rashes, lesions or ulcers Psychiatry: Judgement and insight appear normal. Mood & affect appropriate.     Data Reviewed: I have personally reviewed following  labs and imaging studies  Micro Results No results found for this or any previous visit (from the past 240 hour(s)).  Radiology Reports Ct Abdomen Pelvis W Contrast  Result Date: 06/24/2017 CLINICAL DATA:  Abdominal pain.  Diverticulitis suspected. EXAM: CT ABDOMEN AND PELVIS WITH CONTRAST TECHNIQUE: Multidetector CT imaging of the abdomen and pelvis was performed using the standard protocol following bolus administration of intravenous contrast. CONTRAST:  1109mL ISOVUE-300 IOPAMIDOL (ISOVUE-300) INJECTION 61% COMPARISON:  05/26/2017 FINDINGS: Lower chest:  No acute finding.  Generous heart size. Hepatobiliary: No focal liver abnormality.No evidence of biliary obstruction or stone. Pancreas: History pancreatitis. Large cyst with subtle patchy internal density in place of the pancreatic body measuring 9 x 5.5 cm, diminished from prior when it measured up to 6.5 cm. No internal gas is noted. Inferiorly, this collection continues around the tip of the pancreatic tail. There is increased fluid in the left paraicolic gutter and left upper quadrant fat, findings of active pancreatitis. 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 Spleen: Unremarkable. Adrenals/Urinary Tract: Negative adrenals. No hydronephrosis or stone. Unremarkable bladder. Stomach/Bowel: Mild colonic wall thickening and pericolonic edema at the splenic flexure which is likely secondary. No visible fistula. No bowel obstruction. No appendicitis. Postoperative stomach. Vascular/Lymphatic: Splenic vein findings above. No acute vascular finding. No mass or adenopathy. Reproductive:No pathologic findings. Other: Small volume ascites in the pelvis, considered reactive. This finding is increased from prior. Musculoskeletal: No acute abnormalities. IMPRESSION: 1. Findings of active pancreatitis in the left upper quadrant with small volume ascites, new from 05/26/2017. 2. Prior necrotizing pancreatitis with 9 x 5 cm debris containing  cyst in place of the body. The collection is mildly decreased from the 05/26/2017, with decreased mass effect on the stomach. 3. 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 Electronically Signed   By: Monte Fantasia M.D.   On: 06/24/2017 17:12   Ct Abdomen Pelvis W Contrast  Result Date: 05/26/2017 CLINICAL DATA:  Evaluate pancreas.  Followup cysts. EXAM: CT ABDOMEN AND PELVIS WITH CONTRAST TECHNIQUE: Multidetector CT imaging of the abdomen and pelvis was performed using the standard protocol following bolus administration of intravenous contrast. CONTRAST:  13mL ISOVUE-300 IOPAMIDOL (ISOVUE-300) INJECTION 61% COMPARISON:  04/25/2017. FINDINGS: Lower chest: No pleural effusion identified. The lung bases are clear. Hepatobiliary: No focal liver abnormality identified. The gallbladder is normal. No biliary dilatation. Pancreas: Resolving changes of pancreatitis identified. Overall there has been an interval decrease in upper abdominal inflammatory changes secondary to pancreatitis. Large pseudocyst involving the neck body and tail of pancreas appears to have well defined margins measuring 11.2 x 6.6 cm, image 37 of series 2. On the previous exam this measured 10.7 x 4.0 cm in had more ill defined margins. Smaller cyst involving the distal tail of pancreas measures 2 cm, image 33 of series 2. Previously this measured the same. Decrease in central mesenteric phlegmon formation. Currently 1.6 cm, image 47 of series 2. Previously  3 cm. Spleen: Normal in size without focal abnormality. Adrenals/Urinary Tract: Adrenal glands are unremarkable. Kidneys are normal, without renal calculi, focal lesion, or hydronephrosis. Bladder is unremarkable. 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 No pathologic dilatation of the small or large bowel loops. The appendix is visualized and appears normal. Vascular/Lymphatic: Normal appearance of the abdominal aorta. Large pancreatic  pseudocyst has mass effect upon the portal vein which remains patent without evidence for thrombosis. No enlarged retroperitoneal or mesenteric adenopathy. No enlarged pelvic or inguinal lymph nodes. Reproductive: The uterus and adnexal structures are unremarkable Other: A small volume of ascites is noted within the left upper quadrant of the abdomen and in the dependent portion of the pelvis. Musculoskeletal: No acute or significant osseous findings. IMPRESSION: 1. Decrease in inflammatory changes associated with pancreatitis within the upper abdomen. 2. Large pseudo cyst involving the pancreas has increased in size when compared with the previous exam but appears more well defined. No pseudocyst identified. 3. Previously noted mesenteric phlegmon has decreased from prior exam. Electronically Signed   By: Kerby Moors M.D.   On: 05/26/2017 15:05   Dg Abd 2 Views  Result Date: 06/02/2017 CLINICAL DATA:  Left lower quadrant pain. EXAM: ABDOMEN - 2 VIEW COMPARISON:  CT 05/26/2017.  KUB 04/27/2017. FINDINGS: Surgical sutures left upper abdomen. Soft tissue structures are unremarkable . No bowel distention or free air. Right lower calcification noted most consistent with phleboliths. Mild scoliosis concave right. No acute bony abnormality. IMPRESSION: Surgical sutures left upper abdomen. No acute intra-abdominal abnormality identified. No bowel distention. Electronically Signed   By: Marcello Moores  Register   On: 06/02/2017 16:20     CBC  Recent Labs Lab 06/24/17 1539 06/25/17 0536  WBC 7.3 6.1  HGB 12.6 11.2*  HCT 39.7 35.1*  PLT 364 251  MCV 77.7* 78.7  MCH 24.7* 25.1*  MCHC 31.7 31.9  RDW 13.9 13.9  LYMPHSABS 3.0 1.9  MONOABS 0.6 0.5  EOSABS 0.1 0.1  BASOSABS 0.0 0.0    Chemistries   Recent Labs Lab 06/24/17 1507 06/24/17 1539 06/25/17 0536  NA  --  139 138  K  --  3.3* 3.2*  CL  --  104 104  CO2  --  24 26  GLUCOSE  --  141* 104*  BUN  --  7 <5*  CREATININE  --  0.52 0.40*   CALCIUM  --  9.6 8.4*  MG 1.7  --   --   AST  --  27 18  ALT  --  32 23  ALKPHOS  --  51 39  BILITOT  --  1.0 1.0   ------------------------------------------------------------------------------------------------------------------ estimated creatinine clearance is 105 mL/min (A) (by C-G formula based on SCr of 0.4 mg/dL (L)). ------------------------------------------------------------------------------------------------------------------ No results for input(s): HGBA1C in the last 72 hours. ------------------------------------------------------------------------------------------------------------------ No results for input(s): CHOL, HDL, LDLCALC, TRIG, CHOLHDL, LDLDIRECT in the last 72 hours. ------------------------------------------------------------------------------------------------------------------ No results for input(s): TSH, T4TOTAL, T3FREE, THYROIDAB in the last 72 hours.  Invalid input(s): FREET3 ------------------------------------------------------------------------------------------------------------------ No results for input(s): VITAMINB12, FOLATE, FERRITIN, TIBC, IRON, RETICCTPCT in the last 72 hours.  Coagulation profile No results for input(s): INR, PROTIME in the last 168 hours.  No results for input(s): DDIMER in the last 72 hours.  Cardiac Enzymes No results for input(s): CKMB, TROPONINI, MYOGLOBIN in the last 168 hours.  Invalid input(s): CK ------------------------------------------------------------------------------------------------------------------ Invalid input(s): POCBNP   CBG:  Recent Labs Lab 06/25/17 0113 06/25/17 0624 06/25/17 0715  GLUCAP 103* 48* 163*  Studies: Ct Abdomen Pelvis W Contrast  Result Date: 06/24/2017 CLINICAL DATA:  Abdominal pain.  Diverticulitis suspected. EXAM: CT ABDOMEN AND PELVIS WITH CONTRAST TECHNIQUE: Multidetector CT imaging of the abdomen and pelvis was performed using the standard protocol  following bolus administration of intravenous contrast. CONTRAST:  152mL ISOVUE-300 IOPAMIDOL (ISOVUE-300) INJECTION 61% COMPARISON:  05/26/2017 FINDINGS: Lower chest:  No acute finding.  Generous heart size. Hepatobiliary: No focal liver abnormality.No evidence of biliary obstruction or stone. Pancreas: History pancreatitis. Large cyst with subtle patchy internal density in place of the pancreatic body measuring 9 x 5.5 cm, diminished from prior when it measured up to 6.5 cm. No internal gas is noted. Inferiorly, this collection continues around the tip of the pancreatic tail. There is increased fluid in the left paraicolic gutter and left upper quadrant fat, findings of active pancreatitis. 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 Spleen: Unremarkable. Adrenals/Urinary Tract: Negative adrenals. No hydronephrosis or stone. Unremarkable bladder. Stomach/Bowel: Mild colonic wall thickening and pericolonic edema at the splenic flexure which is likely secondary. No visible fistula. No bowel obstruction. No appendicitis. Postoperative stomach. Vascular/Lymphatic: Splenic vein findings above. No acute vascular finding. No mass or adenopathy. Reproductive:No pathologic findings. Other: Small volume ascites in the pelvis, considered reactive. This finding is increased from prior. Musculoskeletal: No acute abnormalities. IMPRESSION: 1. Findings of active pancreatitis in the left upper quadrant with small volume ascites, new from 05/26/2017. 2. Prior necrotizing pancreatitis with 9 x 5 cm debris containing cyst in place of the body. The collection is mildly decreased from the 05/26/2017, with decreased mass effect on the stomach. 3. 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 Electronically Signed   By: Monte Fantasia M.D.   On: 06/24/2017 17:12      Lab Results  Component Value Date   HGBA1C 5.7 (H) 04/14/2017   HGBA1C 6.2 (H) 01/10/2015   HGBA1C 6.2 (H) 10/24/2013   Lab  Results  Component Value Date   LDLCALC 101 (H) 04/13/2017   CREATININE 0.40 (L) 06/25/2017       Scheduled Meds: . gabapentin  300 mg Oral BID  . lipase/protease/amylase  72,000 Units Oral TID WC  . lubiprostone  24 mcg Oral BID WC  . metoprolol tartrate  5 mg Intravenous Q6H  . pantoprazole (PROTONIX) IV  40 mg Intravenous Q24H   Continuous Infusions: . ciprofloxacin Stopped (06/24/17 2303)  . dextrose 5 % and 0.9 % NaCl with KCl 40 mEq/L       LOS: 1 day    Time spent: >30 MINS    Reyne Dumas  [facility] Pager [phone]. If 7PM-7AM, please contact night-coverage at [email or web address], password [facility] 06/25/2017, 8:31 AM  LOS: 1 day

## 2017-06-26 DIAGNOSIS — K863 Pseudocyst of pancreas: Secondary | ICD-10-CM

## 2017-06-26 DIAGNOSIS — E876 Hypokalemia: Secondary | ICD-10-CM

## 2017-06-26 DIAGNOSIS — D649 Anemia, unspecified: Secondary | ICD-10-CM

## 2017-06-26 DIAGNOSIS — K8500 Idiopathic acute pancreatitis without necrosis or infection: Secondary | ICD-10-CM

## 2017-06-26 LAB — CBC WITH DIFFERENTIAL/PLATELET
Basophils Absolute: 0 10*3/uL (ref 0.0–0.1)
Basophils Relative: 1 %
Eosinophils Absolute: 0.1 10*3/uL (ref 0.0–0.7)
Eosinophils Relative: 3 %
HCT: 35.4 % — ABNORMAL LOW (ref 36.0–46.0)
Hemoglobin: 11.2 g/dL — ABNORMAL LOW (ref 12.0–15.0)
Lymphocytes Relative: 45 %
Lymphs Abs: 1.9 10*3/uL (ref 0.7–4.0)
MCH: 24.7 pg — ABNORMAL LOW (ref 26.0–34.0)
MCHC: 31.6 g/dL (ref 30.0–36.0)
MCV: 78 fL (ref 78.0–100.0)
Monocytes Absolute: 0.3 10*3/uL (ref 0.1–1.0)
Monocytes Relative: 8 %
Neutro Abs: 1.8 10*3/uL (ref 1.7–7.7)
Neutrophils Relative %: 43 %
Platelets: 234 10*3/uL (ref 150–400)
RBC: 4.54 MIL/uL (ref 3.87–5.11)
RDW: 13.8 % (ref 11.5–15.5)
WBC: 4.2 10*3/uL (ref 4.0–10.5)

## 2017-06-26 LAB — COMPREHENSIVE METABOLIC PANEL
ALT: 23 U/L (ref 14–54)
AST: 18 U/L (ref 15–41)
Albumin: 3.8 g/dL (ref 3.5–5.0)
Alkaline Phosphatase: 39 U/L (ref 38–126)
Anion gap: 7 (ref 5–15)
BUN: <5 mg/dL — ABNORMAL LOW (ref 6–20)
CO2: 27 mmol/L (ref 22–32)
Calcium: 8.7 mg/dL — ABNORMAL LOW (ref 8.9–10.3)
Chloride: 104 mmol/L (ref 101–111)
Creatinine, Ser: 0.46 mg/dL (ref 0.44–1.00)
GFR calc Af Amer: >60 mL/min (ref >60)
GFR calc non Af Amer: >60 mL/min (ref >60)
Glucose, Bld: 133 mg/dL — ABNORMAL HIGH (ref 65–99)
Potassium: 2.9 mmol/L — ABNORMAL LOW (ref 3.5–5.1)
Sodium: 138 mmol/L (ref 135–145)
Total Bilirubin: 1.1 mg/dL (ref 0.3–1.2)
Total Protein: 6.6 g/dL (ref 6.5–8.1)

## 2017-06-26 LAB — GLUCOSE, CAPILLARY
Glucose-Capillary: 131 mg/dL — ABNORMAL HIGH (ref 65–99)
Glucose-Capillary: 113 mg/dL — ABNORMAL HIGH (ref 65–99)

## 2017-06-26 LAB — LIPASE, BLOOD: Lipase: 30 U/L (ref 11–51)

## 2017-06-26 MED ORDER — POTASSIUM CHLORIDE IN NACL 40-0.9 MEQ/L-% IV SOLN
INTRAVENOUS | Status: DC
  Administered 2017-06-26: 75 mL/h via INTRAVENOUS

## 2017-06-26 MED ORDER — POTASSIUM CHLORIDE CRYS ER 20 MEQ PO TBCR
40.0000 meq | EXTENDED_RELEASE_TABLET | Freq: Three times a day (TID) | ORAL | Status: AC
  Administered 2017-06-26 (×3): 40 meq via ORAL
  Filled 2017-06-26 (×2): qty 2

## 2017-06-26 MED ORDER — CIPROFLOXACIN IN D5W 400 MG/200ML IV SOLN: 400.0000 mg | Freq: Two times a day (BID) | INTRAVENOUS | Status: DC

## 2017-06-26 MED ORDER — METOPROLOL TARTRATE 25 MG PO TABS
25.0000 mg | ORAL_TABLET | Freq: Two times a day (BID) | ORAL | Status: DC
  Administered 2017-06-26 – 2017-06-27 (×3): 25 mg via ORAL
  Filled 2017-06-26 (×3): qty 1

## 2017-06-26 NOTE — Progress Notes (Addendum)
[facility] PROGRESS NOTE  Rosemary Mossbarger QIW:[id number] DOB: 02-08-1981 DOA: 06/24/2017   PCP: [name], MD     Assessment/Plan: Principal Problem:   Acute pancreatitis Active Problems:   Essential hypertension   Prediabetes   Obstructive sleep apnea   Anemia   Pseudocyst, pancreas   Constipation   Hypokalemia  36 year old female with recent admission for acute necrotizing pancreatitis, deemed to be cryptogenic complicated by pseudocyst formation and splenic vein thrombosis, prolonged hospitalization from 6/24-7/10, seen by [facility] gastroenterology, deemed to have possible gallbladder sludge. She also required postpyloric feeding,PCA for pain control, developed mild ARDS during her previous hospitalization. Now admitted for active pancreatitis and GI of following  Assessment and plan  Acute pancreatitis with recent pseudocyst formation, of unclear etiology Continue telemetry, advanced from clear liquids to full liquids Underwent EUS with FNA last Friday with no evidence of malignancy. Is scheduled for endoscopic necrosectomy  today. Patient advised to postpone this appointment Continue IV fluids. Analgesics as needed. Antiemetics as needed. Protonix 40 mg IVP every 24 hours. GI consulted.      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 The collection is mildly decreased from the 05/26/2017, with decreased mass effect on the stomach as per CT on  06/24/17   Apparently got a prescription for 10 tablets of ciprofloxacin on 8/31. This has been completed. We'll discontinue IV Cipro    Essential hypertension Change back to PO metoprolol Monitor blood pressure.    Prediabetes Last hemoglobin A1c 5.7% on June 26 this year. CBG monitoring every 6 hours   Hypoglycemic resolved  this morning  probably secondary to pancreatitis ,dc'd  D5NS     Obstructive sleep apnea Currently not using nocturnal CPAP.    Constipation Continue Amitiza.    Hypokalemia Magnesium 1.7, potassium 3.2>2.9, replete    DVT prophylaxsis lovenox   Code Status:   Full code      Family Communication: Discussed in detail with the patient, all imaging results, lab results explained to the patient   Disposition Plan:  3-4 days       Consultants:  GI  Procedures:  None   Antibiotics: Anti-infectives    Start     Dose/Rate Route Frequency Ordered Stop   06/24/17 2200  ciprofloxacin (CIPRO) IVPB 400 mg  Status:  Discontinued     400 mg 200 mL/hr over 60 Minutes Intravenous Every 12 hours 06/24/17 2109 06/26/17 1049         HPI/Subjective: Feels pain more controlled and would like to advance diet  Objective: Vitals:   06/25/17 1954 06/25/17 2055 06/26/17 0455 06/26/17 0530  BP:  (!) 142/91 (!) 137/92 133/85  Pulse:  (!) 102 87 83  Resp:  20 18 15   Temp:  98.9 F (37.2 C) 98.3 F (36.8 C) 98.4 F (36.9 C)  TempSrc:  Oral Oral   SpO2: 97% 99% 97% 95%  Weight:      Height:        Intake/Output Summary (Last 24 hours) at 06/26/17 1050 Last data filed at 06/26/17 0840  Gross per 24 hour  Intake          3745.83 ml  Output                0 ml  Net  3745.83 ml    Exam:  Examination:  General exam: Appears calm and comfortable  Respiratory system: Clear to auscultation. Respiratory effort normal. Cardiovascular system: S1 & S2 heard, RRR. No JVD, murmurs, rubs, gallops or clicks. No pedal edema. Gastrointestinal system: Abdomen is nondistended, soft and nontender. No organomegaly or masses felt. Normal bowel sounds heard. Central nervous system: Alert and oriented. No focal neurological deficits. Extremities: Symmetric 5 x 5 power. Skin: No rashes, lesions or ulcers Psychiatry: Judgement and insight appear normal. Mood & affect appropriate.     Data  Reviewed: I have personally reviewed following labs and imaging studies  Micro Results No results found for this or any previous visit (from the past 240 hour(s)).  Radiology Reports Ct Abdomen Pelvis W Contrast  Result Date: 06/24/2017 CLINICAL DATA:  Abdominal pain.  Diverticulitis suspected. EXAM: CT ABDOMEN AND PELVIS WITH CONTRAST TECHNIQUE: Multidetector CT imaging of the abdomen and pelvis was performed using the standard protocol following bolus administration of intravenous contrast. CONTRAST:  167mL ISOVUE-300 IOPAMIDOL (ISOVUE-300) INJECTION 61% COMPARISON:  05/26/2017 FINDINGS: Lower chest:  No acute finding.  Generous heart size. Hepatobiliary: No focal liver abnormality.No evidence of biliary obstruction or stone. Pancreas: History pancreatitis. Large cyst with subtle patchy internal density in place of the pancreatic body measuring 9 x 5.5 cm, diminished from prior when it measured up to 6.5 cm. No internal gas is noted. Inferiorly, this collection continues around the tip of the pancreatic tail. There is increased fluid in the left paraicolic gutter and left upper quadrant fat, findings of active pancreatitis. 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 Spleen: Unremarkable. Adrenals/Urinary Tract: Negative adrenals. No hydronephrosis or stone. Unremarkable bladder. Stomach/Bowel: Mild colonic wall thickening and pericolonic edema at the splenic flexure which is likely secondary. No visible fistula. No bowel obstruction. No appendicitis. Postoperative stomach. Vascular/Lymphatic: Splenic vein findings above. No acute vascular finding. No mass or adenopathy. Reproductive:No pathologic findings. Other: Small volume ascites in the pelvis, considered reactive. This finding is increased from prior. Musculoskeletal: No acute abnormalities. IMPRESSION: 1. Findings of active pancreatitis in the left upper quadrant with small volume ascites, new from 05/26/2017. 2. Prior necrotizing  pancreatitis with 9 x 5 cm debris containing cyst in place of the body. The collection is mildly decreased from the 05/26/2017, with decreased mass effect on the stomach. 3. 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 Electronically Signed   By: Monte Fantasia M.D.   On: 06/24/2017 17:12   Dg Abd 2 Views  Result Date: 06/02/2017 CLINICAL DATA:  Left lower quadrant pain. EXAM: ABDOMEN - 2 VIEW COMPARISON:  CT 05/26/2017.  KUB 04/27/2017. FINDINGS: Surgical sutures left upper abdomen. Soft tissue structures are unremarkable . No bowel distention or free air. Right lower calcification noted most consistent with phleboliths. Mild scoliosis concave right. No acute bony abnormality. IMPRESSION: Surgical sutures left upper abdomen. No acute intra-abdominal abnormality identified. No bowel distention. Electronically Signed   By: Marcello Moores  Register   On: 06/02/2017 16:20     CBC  Recent Labs Lab 06/24/17 1539 06/25/17 0536 06/26/17 0551  WBC 7.3 6.1 4.2  HGB 12.6 11.2* 11.2*  HCT 39.7 35.1* 35.4*  PLT 364 251 234  MCV 77.7* 78.7 78.0  MCH 24.7* 25.1* 24.7*  MCHC 31.7 31.9 31.6  RDW 13.9 13.9 13.8  LYMPHSABS 3.0 1.9 1.9  MONOABS 0.6 0.5 0.3  EOSABS 0.1 0.1 0.1  BASOSABS 0.0 0.0 0.0    Chemistries   Recent Labs Lab 06/24/17 1507 06/24/17  1539 06/25/17 0536 06/25/17 0639 06/26/17 0551  NA  --  139 138  --  138  K  --  3.3* 3.2*  --  2.9*  CL  --  104 104  --  104  CO2  --  24 26  --  27  GLUCOSE  --  141* 104*  --  133*  BUN  --  7 <5*  --  <5*  CREATININE  --  0.52 0.40*  --  0.46  CALCIUM  --  9.6 8.4*  --  8.7*  MG 1.7  --   --  1.9  --   AST  --  27 18  --  18  ALT  --  32 23  --  23  ALKPHOS  --  51 39  --  39  BILITOT  --  1.0 1.0  --  1.1   ------------------------------------------------------------------------------------------------------------------ estimated creatinine clearance is 105 mL/min (by C-G formula based on SCr of 0.46  mg/dL). ------------------------------------------------------------------------------------------------------------------ No results for input(s): HGBA1C in the last 72 hours. ------------------------------------------------------------------------------------------------------------------ No results for input(s): CHOL, HDL, LDLCALC, TRIG, CHOLHDL, LDLDIRECT in the last 72 hours. ------------------------------------------------------------------------------------------------------------------ No results for input(s): TSH, T4TOTAL, T3FREE, THYROIDAB in the last 72 hours.  Invalid input(s): FREET3 ------------------------------------------------------------------------------------------------------------------ No results for input(s): VITAMINB12, FOLATE, FERRITIN, TIBC, IRON, RETICCTPCT in the last 72 hours.  Coagulation profile No results for input(s): INR, PROTIME in the last 168 hours.  No results for input(s): DDIMER in the last 72 hours.  Cardiac Enzymes No results for input(s): CKMB, TROPONINI, MYOGLOBIN in the last 168 hours.  Invalid input(s): CK ------------------------------------------------------------------------------------------------------------------ Invalid input(s): POCBNP   CBG:  Recent Labs Lab 06/25/17 0715 06/25/17 1125 06/25/17 1600 06/25/17 2339 06/26/17 0539  GLUCAP 163* 123* 126* 123* 131*       Studies: Ct Abdomen Pelvis W Contrast  Result Date: 06/24/2017 CLINICAL DATA:  Abdominal pain.  Diverticulitis suspected. EXAM: CT ABDOMEN AND PELVIS WITH CONTRAST TECHNIQUE: Multidetector CT imaging of the abdomen and pelvis was performed using the standard protocol following bolus administration of intravenous contrast. CONTRAST:  194mL ISOVUE-300 IOPAMIDOL (ISOVUE-300) INJECTION 61% COMPARISON:  05/26/2017 FINDINGS: Lower chest:  No acute finding.  Generous heart size. Hepatobiliary: No focal liver abnormality.No evidence of biliary obstruction or stone.  Pancreas: History pancreatitis. Large cyst with subtle patchy internal density in place of the pancreatic body measuring 9 x 5.5 cm, diminished from prior when it measured up to 6.5 cm. No internal gas is noted. Inferiorly, this collection continues around the tip of the pancreatic tail. There is increased fluid in the left paraicolic gutter and left upper quadrant fat, findings of active pancreatitis. 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 Spleen: Unremarkable. Adrenals/Urinary Tract: Negative adrenals. No hydronephrosis or stone. Unremarkable bladder. Stomach/Bowel: Mild colonic wall thickening and pericolonic edema at the splenic flexure which is likely secondary. No visible fistula. No bowel obstruction. No appendicitis. Postoperative stomach. Vascular/Lymphatic: Splenic vein findings above. No acute vascular finding. No mass or adenopathy. Reproductive:No pathologic findings. Other: Small volume ascites in the pelvis, considered reactive. This finding is increased from prior. Musculoskeletal: No acute abnormalities. IMPRESSION: 1. Findings of active pancreatitis in the left upper quadrant with small volume ascites, new from 05/26/2017. 2. Prior necrotizing pancreatitis with 9 x 5 cm debris containing cyst in place of the body. The collection is mildly decreased from the 05/26/2017, with decreased mass effect on the stomach. 3. 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 Electronically Signed   By: Angelica Chessman  Watts M.D.   On: 06/24/2017 17:12      Lab Results  Component Value Date   HGBA1C 5.7 (H) 04/14/2017   HGBA1C 6.2 (H) 01/10/2015   HGBA1C 6.2 (H) 10/24/2013   Lab Results  Component Value Date   LDLCALC 101 (H) 04/13/2017   CREATININE 0.46 06/26/2017       Scheduled Meds: . enoxaparin (LOVENOX) injection  40 mg Subcutaneous Q24H  . gabapentin  300 mg Oral BID  . lipase/protease/amylase  36,000 Units Oral TID AC  . lubiprostone  24 mcg Oral BID WC   . metoprolol tartrate  5 mg Intravenous Q6H  . pantoprazole (PROTONIX) IV  40 mg Intravenous Q24H  . potassium chloride  40 mEq Oral TID   Continuous Infusions: . 0.9 % NaCl with KCl 40 mEq / L 75 mL/hr (06/26/17 0901)     LOS: 2 days    Time spent: >30 MINS    Reyne Dumas  [facility] Pager [phone]. If 7PM-7AM, please contact night-coverage at [email or web address], password [facility] 06/26/2017, 10:50 AM  LOS: 2 days

## 2017-06-26 NOTE — Progress Notes (Signed)
Patient tolerating full liquid diet well. Still c/o of ABD cramping and pain at times but hasn't reported it being worse. CBG Q6H d/c as ordered d/t patient having good PO intake.

## 2017-06-26 NOTE — Progress Notes (Signed)
Subjective: The patient states she feels good today. She is tolerating her clear liquid diet other than "not sure how much green Jell-O I can eat eat." She is requesting her diabetes advanced. Denies any abdominal pain other than the occasional twinge. Denies nausea and vomiting. No other GI symptoms.  Objective: Vital signs in last 24 hours: Temp:  [98.2 F (36.8 C)-98.9 F (37.2 C)] 98.4 F (36.9 C) (09/07 0530) Pulse Rate:  [83-102] 83 (09/07 0530) Resp:  [15-20] 15 (09/07 0530) BP: (126-142)/(80-92) 133/85 (09/07 0530) SpO2:  [95 %-99 %] 95 % (09/07 0530) Last BM Date: 06/25/17 General:   Alert and oriented, pleasant Eyes:  No icterus, sclera clear. Conjuctiva pink.  Heart:  S1, S2 present, no murmurs noted.  Lungs: Clear to auscultation bilaterally, without wheezing, rales, or rhonchi.  Abdomen:  Bowel sounds present, soft, non-distended. Mild soreness left-mid abdomen. No HSM or hernias noted. No rebound or guarding. No masses appreciated  Msk:  Symmetrical without gross deformities. Pulses:  Normal bilateral DP pulses noted. Extremities:  Without clubbing or edema. Neurologic:  Alert and  oriented x4;  grossly normal neurologically. Psych:  Alert and cooperative. Normal mood and affect.  Intake/Output from previous day: 09/06 0701 - 09/07 0700 In: 3705.8 [P.O.:910; I.V.:2395.8; IV Piggyback:400] Out: -  Intake/Output this shift: No intake/output data recorded.  Lab Results:  Recent Labs  06/24/17 1539 06/25/17 0536 06/26/17 0551  WBC 7.3 6.1 4.2  HGB 12.6 11.2* 11.2*  HCT 39.7 35.1* 35.4*  PLT 364 251 234   BMET  Recent Labs  06/24/17 1539 06/25/17 0536 06/26/17 0551  NA 139 138 138  K 3.3* 3.2* 2.9*  CL 104 104 104  CO2 24 26 27   GLUCOSE 141* 104* 133*  BUN 7 <5* <5*  CREATININE 0.52 0.40* 0.46  CALCIUM 9.6 8.4* 8.7*   LFT  Recent Labs  06/24/17 1539 06/25/17 0536 06/26/17 0551  PROT 7.7 6.3* 6.6  ALBUMIN 4.6 3.7 3.8  AST 27 18 18    ALT 32 23 23  ALKPHOS 51 39 39  BILITOT 1.0 1.0 1.1   PT/INR No results for input(s): LABPROT, INR in the last 72 hours. Hepatitis Panel No results for input(s): HEPBSAG, HCVAB, HEPAIGM, HEPBIGM in the last 72 hours.   Studies/Results: Ct Abdomen Pelvis W Contrast  Result Date: 06/24/2017 CLINICAL DATA:  Abdominal pain.  Diverticulitis suspected. EXAM: CT ABDOMEN AND PELVIS WITH CONTRAST TECHNIQUE: Multidetector CT imaging of the abdomen and pelvis was performed using the standard protocol following bolus administration of intravenous contrast. CONTRAST:  154mL ISOVUE-300 IOPAMIDOL (ISOVUE-300) INJECTION 61% COMPARISON:  05/26/2017 FINDINGS: Lower chest:  No acute finding.  Generous heart size. Hepatobiliary: No focal liver abnormality.No evidence of biliary obstruction or stone. Pancreas: History pancreatitis. Large cyst with subtle patchy internal density in place of the pancreatic body measuring 9 x 5.5 cm, diminished from prior when it measured up to 6.5 cm. No internal gas is noted. Inferiorly, this collection continues around the tip of the pancreatic tail. There is increased fluid in the left paraicolic gutter and left upper quadrant fat, findings of active pancreatitis. 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 Spleen: Unremarkable. Adrenals/Urinary Tract: Negative adrenals. No hydronephrosis or stone. Unremarkable bladder. Stomach/Bowel: Mild colonic wall thickening and pericolonic edema at the splenic flexure which is likely secondary. No visible fistula. No bowel obstruction. No appendicitis. Postoperative stomach. Vascular/Lymphatic: Splenic vein findings above. No acute vascular finding. No mass or adenopathy. Reproductive:No pathologic findings. Other: Small  volume ascites in the pelvis, considered reactive. This finding is increased from prior. Musculoskeletal: No acute abnormalities. IMPRESSION: 1. Findings of active pancreatitis in the left upper quadrant with small  volume ascites, new from 05/26/2017. 2. Prior necrotizing pancreatitis with 9 x 5 cm debris containing cyst in place of the body. The collection is mildly decreased from the 05/26/2017, with decreased mass effect on the stomach. 3. 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 Electronically Signed   By: Monte Fantasia M.D.   On: 06/24/2017 17:12    Assessment: 36 year old female with history of acute necrotizing pancreatitis back in June 2018, prolonged hospitalization, complicated by large cyst formation. Underwent EUS with FNA last Friday with no evidence of malignancy. Was scheduled for endoscopic necrosectomy this morning at [facility] but missed due to hospitalization. Patient has had intermittent crampy left-sided pain in the setting of constipation. She thought the two were related. Her last BM was 1 week ago. CT this admission suggest new changes in the past one month with active pancreatitis with ascites in the left upper quadrant. Minimally elevated lipase. Does not look acutely ill at this time. Her pulse has been running in the low 100 range, she is afebrile, normal white count and creatinine.  Constipation poorly managed. Has "some rumbling" this morning after suppository, but no productive bowel movement. Will have additional options when her diet is advanced.  Overall she is clinically improved today. She looks quite well. Denies significant pain, nausea, vomiting. She is tolerating her clear liquids and is requesting her diet be advanced. Her labs show CBC was stable hemoglobin, CMP with hypokalemia at 2.9 but normal kidney function is 0.46. Lipase today is normal at 30. Overall she can likely discharge soon from a GI standpoint. She will benefit greatly from getting to [facility] for her previously planned procedure.  Plan: 1. Advance diet to full liquid and possible low fat this evening 2. Supportive measures 3. Follow-up with [facility] for  planned procedure   Thank you for allowing [place] to participate in the care of [place], DNP, AGNP-C Adult & Gerontological Nurse Practitioner [facility]     LOS: 2 days    06/26/2017, 8:28 AM

## 2017-06-27 DIAGNOSIS — K859 Acute pancreatitis without necrosis or infection, unspecified: Secondary | ICD-10-CM

## 2017-06-27 DIAGNOSIS — K8502 Idiopathic acute pancreatitis with infected necrosis: Secondary | ICD-10-CM

## 2017-06-27 DIAGNOSIS — G4733 Obstructive sleep apnea (adult) (pediatric): Secondary | ICD-10-CM

## 2017-06-27 DIAGNOSIS — I10 Essential (primary) hypertension: Secondary | ICD-10-CM

## 2017-06-27 DIAGNOSIS — K8500 Idiopathic acute pancreatitis without necrosis or infection: Secondary | ICD-10-CM

## 2017-06-27 DIAGNOSIS — K863 Pseudocyst of pancreas: Secondary | ICD-10-CM

## 2017-06-27 LAB — BASIC METABOLIC PANEL
Anion gap: 8 (ref 5–15)
BUN: <5 mg/dL — ABNORMAL LOW (ref 6–20)
CO2: 25 mmol/L (ref 22–32)
Calcium: 9.1 mg/dL (ref 8.9–10.3)
Chloride: 107 mmol/L (ref 101–111)
Creatinine, Ser: 0.45 mg/dL (ref 0.44–1.00)
GFR calc Af Amer: >60 mL/min (ref >60)
GFR calc non Af Amer: >60 mL/min (ref >60)
Glucose, Bld: 97 mg/dL (ref 65–99)
Potassium: 3.9 mmol/L (ref 3.5–5.1)
Sodium: 140 mmol/L (ref 135–145)

## 2017-06-27 NOTE — Progress Notes (Signed)
  Subjective:  Patient has no complaints. She ate all of her breakfast without experiencing abdominal pain nausea or vomiting.  Objective: Blood pressure (!) 140/97, pulse 83, temperature 98.4 F (36.9 C), temperature source Oral, resp. rate 18, height 5\' 4"  (1.626 m), weight 200 lb 1.6 oz (90.8 kg), SpO2 99 %. Patient is alert and in no acute distress. Abdomen is full. Bowel sounds are normal. On palpation abdomen is soft. Mild tenderness noted below the left costal margin on deep palp masses. No LE edema or clubbing noted.  Labs/studies Results:   Recent Labs  06/24/17 1539 06/25/17 0536 06/26/17 0551  WBC 7.3 6.1 4.2  HGB 12.6 11.2* 11.2*  HCT 39.7 35.1* 35.4*  PLT 364 251 234    BMET   Recent Labs  06/25/17 0536 06/26/17 0551 06/27/17 0617  NA 138 138 140  K 3.2* 2.9* 3.9  CL 104 104 107  CO2 26 27 25   GLUCOSE 104* 133* 97  BUN <5* <5* <5*  CREATININE 0.40* 0.46 0.45  CALCIUM 8.4* 8.7* 9.1    LFT   Recent Labs  06/24/17 1539 06/25/17 0536 06/26/17 0551  PROT 7.7 6.3* 6.6  ALBUMIN 4.6 3.7 3.8  AST 27 18 18   ALT 32 23 23  ALKPHOS 51 39 39  BILITOT 1.0 1.0 1.1      Assessment:  #1.  Patient with history of necrotizing pancreatitis which she suffered in June 2018. She has developed large pseudocyst. It appears she had another mild bout of pancreatitis for which she has rapidly recovered. She was to have endoscopic therapy for pancreatic pseudocyst this week at [facility].she will have to be rescheduled. 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 She will also need cholecystectomy  at some point. #2. Hypokalemia. Serum potassium is now normal.  Recommendations:  Patient will continue low-fat diet. Patient will contact GI physician at [facility] next week to reschedule endoscopic therapy for pancreatic pseudocyst. Cholecystectomy at a later date.

## 2017-06-27 NOTE — Progress Notes (Signed)
Patient discharged home.  IV removed - WNL.  Reviewed DC instructions and medications.  Instructed to follow up with GI and PCP.  No questions at this time.  Verbalizes understanding. Patient in NAD at this time.

## 2017-06-27 NOTE — Discharge Instructions (Signed)
Acute Pancreatitis  Acute pancreatitis is a condition in which the pancreas suddenly becomes irritated and swollen (has inflammation). The pancreas is a gland that is located behind the stomach. It produces enzymes that help to digest food. The pancreas also releases the hormones glucagon and insulin, which help to regulate blood sugar. Damage to the pancreas occurs when the digestive enzymes from the pancreas are activated before they are released into the intestine.  Most acute attacks last a couple of days and can cause serious problems. Some people become dehydrated and develop low blood pressure. In severe cases, bleeding into the pancreas can lead to shock and can be life-threatening. The lungs, heart, and kidneys may fail.  What are the causes?  The most common causes of this condition are:  · Alcohol abuse.  · Gallstones.    Other causes include:  · Certain medicines.  · Exposure to certain chemicals.  · Infection.  · Damage caused by an accident (trauma).  · Abdominal surgery.    In some cases, the cause may not be known.  What are the signs or symptoms?  Symptoms of this condition include:  · Pain in the upper abdomen that may radiate to the back.  · Tenderness and swelling of the abdomen.  · Nausea and vomiting.    How is this diagnosed?  This condition may be diagnosed based on:  · A physical exam.  · Blood tests.  · Imaging tests, such as X-rays, CT scans, or an ultrasound of the abdomen.    How is this treated?  Treatment for this condition usually requires a stay in the hospital. Treatment may include:  · Pain medicine.  · Fluid replacement through an IV tube.  · Placing a tube in the stomach to remove stomach contents and to control vomiting (NG tube, or nasogastric tube).  · Not eating for 3-4 days. This gives the pancreas a rest, because enzymes are not being produced that can cause further damage.  · Antibiotic medicines, if your condition is caused by an infection.  · Surgery on the pancreas or  gallbladder.    Follow these instructions at home:  Eating and drinking  · Follow instructions from your health care provider about diet. This may involve avoiding alcohol and decreasing the amount of fat in your diet.  · Eat smaller, more frequent meals. This reduces the amount of digestive fluids that the pancreas produces.  · Drink enough fluid to keep your urine clear or pale yellow.  · Do not drink alcohol if it caused your condition.  General instructions  · Take over-the-counter and prescription medicines only as told by your health care provider.  · Do not use any tobacco products, such as cigarettes, chewing tobacco, and e-cigarettes. If you need help quitting, ask your health care provider.  · Get plenty of rest.  · If directed, check your blood sugar at home as told by your health care provider.  · Keep all follow-up visits as told by your health care provider. This is important.  Contact a health care provider if:  · You do not recover as quickly as expected.  · You develop new or worsening symptoms.  · You have persistent pain, weakness, or nausea.  · You recover and then have another episode of pain.  · You have a fever.  Get help right away if:  · You cannot eat or keep fluids down.  · Your pain becomes severe.  · Your skin or the   white part of your eyes turns yellow (jaundice).  · You vomit.  · You feel dizzy or you faint.  · Your blood sugar is high (over 300 mg/dL).  This information is not intended to replace advice given to you by your health care provider. Make sure you discuss any questions you have with your health care provider.  Document Released: 10/06/2005 Document Revised: 02/13/2016 Document Reviewed: 07/10/2015  Elsevier Interactive Patient Education © 2018 Elsevier Inc.

## 2017-06-27 NOTE — Discharge Summary (Signed)
DISCHARGE SUMMARY  Kaylee Montgomery  MR#: [id number]  DOB:10/21/1980  Date of Admission: 06/24/2017 Date of Discharge: 06/27/2017  Attending Physician:Iam Lipson T  Patient's NWG:[id number], Milus Mallick, MD  Consults:  GI  Disposition: D/C home   Follow-up Appts: Follow-up Information    Kerri Perches, MD. Schedule an appointment as soon as possible for a visit in 1 week(s).   Specialty:  Family Medicine Contact information: [address] [place] [address] [phone]        [facility] Follow up.   Why:  Follow up as instructed in your d/c counseling w/ GI.            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  She was seen by Gastroenterology and deemed to have possible gallbladder sludge. She required postpyloric feeding, PCA for pain control, and developed mild ARDS during her previous hospitalization. She returned to the ED with recurrent abdominal pain and nausea.  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  Underwent EUS with FNA just prior to his hospitalization, with no evidence of malignancy - GI followed th/o her stay at [facility] - w/ bowel rest, her sx have improved significantly - she remains stable otherwise - cleared for d/c home by GI - counseled extensively on dietary precautions, and f/u plan as per GI prior to d/c   Large pseudocyst  prior necrotizing pancreatitis with 9 x 5 cm debris containing cyst in place of the body - collection is mildly decreased from the 05/26/2017, with  decreased mass effect on the stomach as per CT on 06/24/17 - missed a scheduled drainage procedure at [facility] during this admit - to f/u w/ GI at [facility] to reschedule a procedure time  Chronic splenic vein thrombosis No acute complications during this hospital stay  Hypokalemia Corrected w/ supplementation   HTN BP will need to be followed in the outpt setting  Prediabetes  CBG controlled during this hospital stay w/o need to initiate medical tx    Allergies as of 06/27/2017      Reactions   Bromocriptine Mesylate Nausea Only   Depo-medrol [methylprednisolone Acetate] Itching, Rash   Dilaudid [hydromorphone Hcl] Itching   Imitrex [sumatriptan] Rash      Medication List    TAKE these medications   acetaminophen 500 MG tablet Commonly known as:  TYLENOL Take 1,000 mg by mouth every 6 (six) hours as needed.   ALPRAZolam 0.25 MG tablet Commonly known as:  XANAX One tablet at bedtimeas needed for anxiety   ciprofloxacin 500 MG tablet Commonly known as:  CIPRO Take 500 mg by mouth 2 (two) times daily.   cyclobenzaprine 5 MG tablet Commonly known as:  FLEXERIL Take 1 tablet (5 mg total) by mouth at bedtime.   gabapentin 300 MG capsule Commonly known as:  NEURONTIN Take 300 mg by mouth 2 (two) times daily.   lipase/protease/amylase 13244 UNITS Cpep capsule Commonly known as:  CREON Take 2 capsules (72,000 Units total) by mouth 3 (three) times daily with meals.   lubiprostone 24 MCG capsule Commonly known as:  AMITIZA Take 24 mcg by mouth 2 (two) times daily with a meal.  Metoprolol Tartrate 75 MG Tabs Take 75 mg by mouth 2 (two) times daily.   pantoprazole 40 MG tablet Commonly known as:  PROTONIX Take 1 tablet (40 mg total) by mouth 2 (two) times daily. What changed:  when to take this            Discharge Care Instructions        Start     Ordered   06/27/17 0000  Increase activity slowly     06/27/17 1120      Day of Discharge BP (!) 140/97 (BP  Location: Left Arm)   Pulse 83   Temp 98.4 F (36.9 C) (Oral)   Resp 18   Ht 5\' 4"  (1.626 m)   Wt 90.8 kg (200 lb 1.6 oz)   SpO2 99%   BMI 34.35 kg/m   Physical Exam: General: No acute respiratory distress Lungs: Clear to auscultation bilaterally without wheezes or crackles Cardiovascular: Regular rate and rhythm without murmur gallop or rub normal S1 and S2 Abdomen: Nontender, nondistended, soft, bowel sounds positive, no rebound, no ascites, no appreciable mass Extremities: No significant cyanosis, clubbing, or edema bilateral lower extremities  Basic Metabolic Panel:  Recent Labs Lab 06/24/17 1507 06/24/17 1539 06/25/17 0536 06/25/17 0639 06/26/17 0551 06/27/17 0617  NA  --  139 138  --  138 140  K  --  3.3* 3.2*  --  2.9* 3.9  CL  --  104 104  --  104 107  CO2  --  24 26  --  27 25  GLUCOSE  --  141* 104*  --  133* 97  BUN  --  7 <5*  --  <5* <5*  CREATININE  --  0.52 0.40*  --  0.46 0.45  CALCIUM  --  9.6 8.4*  --  8.7* 9.1  MG 1.7  --   --  1.9  --   --   PHOS 2.8  --   --  2.9  --   --     Liver Function Tests:  Recent Labs Lab 06/24/17 1539 06/25/17 0536 06/26/17 0551  AST 27 18 18   ALT 32 23 23  ALKPHOS 51 39 39  BILITOT 1.0 1.0 1.1  PROT 7.7 6.3* 6.6  ALBUMIN 4.6 3.7 3.8    Recent Labs Lab 06/24/17 1539 06/25/17 0540 06/26/17 0551  LIPASE 148* 63* 30    CBC:  Recent Labs Lab 06/24/17 1539 06/25/17 0536 06/26/17 0551  WBC 7.3 6.1 4.2  NEUTROABS 3.5 3.6 1.8  HGB 12.6 11.2* 11.2*  HCT 39.7 35.1* 35.4*  MCV 77.7* 78.7 78.0  PLT 364 251 234    CBG:  Recent Labs Lab 06/25/17 1125 06/25/17 1600 06/25/17 2339 06/26/17 0539 06/26/17 1140  GLUCAP 123* 126* 123* 131* 113*    Time spent in discharge (includes decision making & examination of pt): 35 minutes  06/27/2017, 11:22 AM   Lonia Blood, MD [facility] Office  [phone] Pager [phone]  On-Call/Text Page:      [email or web address]      password  [facility]

## 2017-06-29 ENCOUNTER — Telehealth: Payer: Self-pay | Admitting: Family Medicine

## 2017-06-29 NOTE — Telephone Encounter (Signed)
Called patient regarding message below. No answer, left generic message for patient to return call.   

## 2017-06-30 ENCOUNTER — Encounter: Payer: Self-pay | Admitting: Family Medicine

## 2017-06-30 ENCOUNTER — Ambulatory Visit (INDEPENDENT_AMBULATORY_CARE_PROVIDER_SITE_OTHER): Payer: 59 | Admitting: Family Medicine

## 2017-06-30 VITALS — BP 128/64 | HR 64 | Temp 97.0°F | Resp 16 | Ht 64.0 in | Wt 198.8 lb

## 2017-06-30 DIAGNOSIS — I10 Essential (primary) hypertension: Secondary | ICD-10-CM

## 2017-06-30 DIAGNOSIS — Z09 Encounter for follow-up examination after completed treatment for conditions other than malignant neoplasm: Secondary | ICD-10-CM

## 2017-06-30 NOTE — Patient Instructions (Signed)
F/u as before, call if yyou need me sooner  Need to take blood pressure medicine every day as scheduled  Nurse blood pressure first week in October  It is important that you exercise regularly at least 30 minutes 5 times a week. If you develop chest pain, have severe difficulty breathing, or feel very tired, stop exercising immediately and seek medical attention  Thank you  for choosing [facility]. We consider it a privelige to serve you.  Delivering excellent health care in a caring and  compassionate way is our goal.  Partnering with you,  so that together we can achieve this goal is our strategy.

## 2017-07-01 ENCOUNTER — Ambulatory Visit (INDEPENDENT_AMBULATORY_CARE_PROVIDER_SITE_OTHER): Payer: 59 | Admitting: Gastroenterology

## 2017-07-01 ENCOUNTER — Encounter: Payer: Self-pay | Admitting: Gastroenterology

## 2017-07-01 ENCOUNTER — Telehealth: Payer: Self-pay | Admitting: Gastroenterology

## 2017-07-01 VITALS — BP 142/100 | HR 103 | Temp 97.5°F | Ht 64.0 in | Wt 193.2 lb

## 2017-07-01 DIAGNOSIS — K8591 Acute pancreatitis with uninfected necrosis, unspecified: Secondary | ICD-10-CM

## 2017-07-01 MED ORDER — TRAMADOL HCL 50 MG PO TABS: 50.0000 mg | ORAL_TABLET | Freq: Four times a day (QID) | ORAL | 0 refills | Status: DC | PRN

## 2017-07-01 NOTE — Patient Instructions (Signed)
For now, continue Amitiza twice a day and add Miralax once to twice a day as needed. I have given you Trulance samples to try in the future if this regimen does not work. You would take the Trulance once a day, with or without food.  Continue the pancreas enzymes.  I have written a prescription for pain medication. Let me know if any issues.  We have referred you to general surgery at [facility] to discuss when is the best time to remove your gallbladder.  Good luck tomorrow!  We will see you in 6-8 weeks.

## 2017-07-01 NOTE — Telephone Encounter (Signed)
Noted  

## 2017-07-01 NOTE — Progress Notes (Signed)
Referring Provider: [name], MD Primary Care Physician:  [name], MD Primary GI: Dr. Oneida Alar   Chief Complaint  Patient presents with  . Pancreatitis    HPI:   Kaylee Montgomery is a 36 y.o. 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 Underwent EUS with FNA last Friday with no evidence of malignancy. Scheduled for  endoscopic necro[facility]omytomorrow [facility] at [facility]. Recently inpatient with acute pancreatitis, mild, improved with supportive measures.   Today is a good day. Amitiza 24 mcg BID. BM on Friday and then today (wednesday). Doesn't want to take Linzess as this caused cramping. Tomorrow appt at [facility] at [place]. No N/V. Scared with oral intake. Needs disability forms updated. Needs evaluation for cholecystectomy in future.   Past Medical History:  Diagnosis Date  . Breast mass 02/2017  . Dental crown present   . Hypertension    states BP fluctuates; has been on med. x 1 yr.  . Migraines   . 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 Pituitary adenoma ([place])   . Seasonal allergies   . Sleep apnea    no CPAP use in > 6 mos.    Past Surgical History:  Procedure Laterality Date  . BREAST LUMPECTOMY WITH RADIOACTIVE SEED LOCALIZATION Left 03/05/2017   Procedure: LEFT BREAST LUMPECTOMY WITH RADIOACTIVE SEED LOCALIZATION;  Surgeon: Donnie Mesa, MD;  Location: [place];  Service: General;  Laterality: Left;  . CESAREAN SECTION    . EUS  06/19/2017   [facility]: 7 x 7.3 cm cyst with necrotic material noted adjacent to the body of the pancreas. FNA without malignancy.  Marland Kitchen LAPAROSCOPIC GASTRIC SLEEVE RESECTION N/A 02/18/2016   Procedure: LAPAROSCOPIC GASTRIC SLEEVE RESECTION, UPPER ENDO;  Surgeon: Greer Pickerel, MD;  Location: [facility];  Service: General;  Laterality: N/A;  . 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 TONSILLECTOMY  age 59    Current Outpatient Prescriptions  Medication Sig Dispense Refill  . acetaminophen (TYLENOL) 500 MG tablet Take 1,000 mg by mouth every 6 (six) hours as needed.    . ALPRAZolam (XANAX) 0.25 MG tablet One tablet at bedtimeas needed for anxiety 10 tablet 0  . cyclobenzaprine (FLEXERIL) 5 MG tablet Take 1 tablet (5 mg total) by mouth at bedtime. 90 tablet 3  . gabapentin (NEURONTIN) 300 MG capsule Take 300 mg by mouth 2 (two) times daily.     . lipase/protease/amylase (CREON) 36000 UNITS CPEP capsule Take 2 capsules (72,000 Units total) by mouth 3 (three) times daily with meals. 270 capsule 0  . lubiprostone (AMITIZA) 24 MCG capsule Take 24 mcg by mouth 2 (two) times daily with a meal.    . Metoprolol Tartrate 75 MG TABS Take 75 mg by mouth 2 (two) times daily. 60 tablet 5  . pantoprazole (PROTONIX) 40 MG tablet Take 1 tablet (40 mg total) by mouth 2 (two) times daily. (Patient taking differently: Take 40 mg by mouth daily. ) 60 tablet 0  . traMADol (ULTRAM) 50 MG tablet Take 1 tablet (50 mg total) by mouth every 6 (six) hours as needed. 30 tablet 0   No current facility-administered medications for this visit.     Allergies as of 07/01/2017 - Review Complete 07/01/2017  Allergen Reaction Noted  . Bromocriptine mesylate Nausea Only 07/03/2008  . Depo-medrol [methylprednisolone acetate] Itching and  Rash 01/29/2015  . Dilaudid [hydromorphone hcl] Itching 06/26/2017  . Imitrex [sumatriptan] Rash 03/29/2015    Family History  Problem Relation Age of Onset  . Heart disease Father        stent  . Migraines Father   . Migraines Son   . Diabetes Maternal Grandfather   . Colon cancer Neg Hx     Social History   Social History  . Marital status: Single    Spouse name: N/A  . Number of children: 1  . Years of education: N/A   Occupational History  . STUDENT    . [employer]   Social History Main Topics  . Smoking status: Never Smoker  . Smokeless tobacco: Never Used  . Alcohol use No     Comment: never drank regularly or heavily but NO etoh at all since 01/2017  . Drug use: No  . Sexual activity: Not Asked   Other Topics Concern  . None   Social History Narrative   Patient is right handed.   Patient drinks 1-2 cups of caffeine daily.    Review of Systems: Gen: see HPI  CV: Denies chest pain, palpitations, syncope, peripheral edema, and claudication. Resp: Denies dyspnea at rest, cough, wheezing, coughing up blood, and pleurisy. GI: see HPI  Derm: Denies rash, itching, dry skin Psych: Denies depression, anxiety, memory loss, confusion. No homicidal or suicidal ideation.  Heme: Denies bruising, bleeding, and enlarged lymph nodes.  Physical Exam: BP (!) 142/100   Pulse (!) 103   Temp (!) 97.5 F (36.4 C) (Oral)   Ht 5\' 4"  (1.626 m)   Wt 193 lb 3.2 oz (87.6 kg)   BMI 33.16 kg/m  General:   Alert and oriented. No distress noted. Pleasant and cooperative.  Head:  Normocephalic and atraumatic. Eyes:  Conjuctiva clear without scleral icterus. Mouth:  Oral mucosa pink and moist.  Abdomen:  +BS, soft, mild TTP LLQ and non-distended. No rebound or guarding. No HSM or masses noted. Msk:  Symmetrical without gross deformities. Normal posture. Extremities:  Without edema. Neurologic:  Alert and  oriented x4 Psych:  Alert and cooperative. Normal mood and affect.

## 2017-07-01 NOTE — Telephone Encounter (Signed)
PT HAVING LEFT SIDE PAIN. WENT TO ED AND WAS ADMITTED. HAS AN APPT TO SEE ANNA TODAY. APPETITE: NO EATING MUCH. SCARED TO EAT A LOT OF THINGS. WANTS TO KNOW IF SHE WILL BE ABLE TO DRINK ETOH AGAIN. NEEDS PAPERWORK TO EXTEND DISABILITY FOR 3 MORE MOS.  EXPLAINED TO HER IF ALL GOES WELL SHE SHOULD RECOVER WITHIN THE NEXT 6 MOS TO ONE YEAR. RARELY USES XANAX BUT HAS THEM. NEEDS ULTRAM FOR PAIN CONTROL. OK TO EAT A SMALL RIB-EYE. PHENERGAN IF NEEDED TO CONTROL NAUSEA.  REFER TO GENERAL SURGERY FOR CHOLECYSTECTOMY AT [facility].

## 2017-07-02 DIAGNOSIS — I10 Essential (primary) hypertension: Secondary | ICD-10-CM | POA: Diagnosis not present

## 2017-07-02 DIAGNOSIS — K8020 Calculus of gallbladder without cholecystitis without obstruction: Secondary | ICD-10-CM | POA: Diagnosis not present

## 2017-07-02 DIAGNOSIS — K861 Other chronic pancreatitis: Secondary | ICD-10-CM | POA: Diagnosis not present

## 2017-07-02 DIAGNOSIS — K862 Cyst of pancreas: Secondary | ICD-10-CM | POA: Diagnosis not present

## 2017-07-02 DIAGNOSIS — K8689 Other specified diseases of pancreas: Secondary | ICD-10-CM | POA: Diagnosis not present

## 2017-07-02 DIAGNOSIS — K859 Acute pancreatitis without necrosis or infection, unspecified: Secondary | ICD-10-CM | POA: Diagnosis not present

## 2017-07-02 NOTE — Progress Notes (Signed)
CC'D TO PCP °

## 2017-07-02 NOTE — Assessment & Plan Note (Addendum)
Undergoing prolonged hospitalization. Appt at [facility] scheduled 9/13 for endoscopic necrosectomy. Provided ultram for short-term use for pain control. Add Miralax BID to Amitiza BID dosing. Also provided trial of Trulance if prior regimen is ineffective. Extended disability time so she may recover and provided letter today. Finally, refer to General Surgery at [facility] for cholecystectomy in the future. Continue Creon. 6-8 weeks return.

## 2017-07-03 DIAGNOSIS — I10 Essential (primary) hypertension: Secondary | ICD-10-CM | POA: Diagnosis not present

## 2017-07-03 DIAGNOSIS — K859 Acute pancreatitis without necrosis or infection, unspecified: Secondary | ICD-10-CM | POA: Diagnosis not present

## 2017-07-03 DIAGNOSIS — K862 Cyst of pancreas: Secondary | ICD-10-CM | POA: Diagnosis not present

## 2017-07-03 DIAGNOSIS — K8591 Acute pancreatitis with uninfected necrosis, unspecified: Secondary | ICD-10-CM | POA: Diagnosis not present

## 2017-07-03 DIAGNOSIS — K8020 Calculus of gallbladder without cholecystitis without obstruction: Secondary | ICD-10-CM | POA: Diagnosis not present

## 2017-07-03 DIAGNOSIS — K863 Pseudocyst of pancreas: Secondary | ICD-10-CM | POA: Diagnosis not present

## 2017-07-03 DIAGNOSIS — K8689 Other specified diseases of pancreas: Secondary | ICD-10-CM | POA: Diagnosis not present

## 2017-07-08 ENCOUNTER — Telehealth: Payer: Self-pay | Admitting: Gastroenterology

## 2017-07-08 NOTE — Telephone Encounter (Signed)
I'm not able to see specifics about [facility] with upcoming appts. I need someone to check and see what this appt is about.

## 2017-07-08 NOTE — Telephone Encounter (Signed)
Spoke with Estill Bamberg at [place] and she is set up for an EGD on Friday at 6:30 am

## 2017-07-08 NOTE — Telephone Encounter (Signed)
Patient called asking if she could leave a message for SF or AB. She said that she is scheduled for this Friday to go to [facility] and she wants clarification of what this referral/procedure was for. She asked for a return call to [phone]

## 2017-07-09 ENCOUNTER — Telehealth: Payer: Self-pay | Admitting: Gastroenterology

## 2017-07-09 NOTE — Telephone Encounter (Signed)
REVIEWED-NO ADDITIONAL RECOMMENDATIONS. 

## 2017-07-09 NOTE — Telephone Encounter (Signed)
To AB.  

## 2017-07-09 NOTE — Telephone Encounter (Signed)
Are they on my desk?

## 2017-07-09 NOTE — Telephone Encounter (Signed)
I still can't see the report or the plan. I need the actual EUS report from 9/13 in order to see what is happening tomorrow. As we are not doing this, can she call [facility]?

## 2017-07-09 NOTE — Telephone Encounter (Signed)
They are on Dr. Oneida Alar desk. Just wanted you to know what was going on since you had the other note on pt.

## 2017-07-09 NOTE — Telephone Encounter (Signed)
FYI to Anna Boone, NP.  

## 2017-07-09 NOTE — Telephone Encounter (Signed)
I spoke to pt and was telling her and Mina Marble called and told her about appt and explained to her they were doing a clean up from where the stents were put in.

## 2017-07-09 NOTE — Telephone Encounter (Signed)
There are FMLA papers from [facility] [employer] on this patient for SF to fill out. I have clipped them to a red folder and placed in her office chair.

## 2017-07-10 DIAGNOSIS — K862 Cyst of pancreas: Secondary | ICD-10-CM | POA: Diagnosis not present

## 2017-07-10 DIAGNOSIS — K8591 Acute pancreatitis with uninfected necrosis, unspecified: Secondary | ICD-10-CM | POA: Diagnosis not present

## 2017-07-10 DIAGNOSIS — K863 Pseudocyst of pancreas: Secondary | ICD-10-CM | POA: Diagnosis not present

## 2017-07-10 DIAGNOSIS — Z9884 Bariatric surgery status: Secondary | ICD-10-CM | POA: Diagnosis not present

## 2017-07-10 DIAGNOSIS — G4733 Obstructive sleep apnea (adult) (pediatric): Secondary | ICD-10-CM | POA: Diagnosis not present

## 2017-07-10 NOTE — Assessment & Plan Note (Signed)
Uncontrolled due to medication non compliance , re educated re the danger of this behavior DASH diet and commitment to daily physical activity for a minimum of 30 minutes discussed and encouraged, as a part of hypertension management. The importance of attaining a healthy weight is also discussed.  BP/Weight 07/01/2017 06/30/2017 06/27/2017 06/24/2017 06/15/2017 0/90/3014 06/29/6923  Systolic BP 932 419 914 - 445 - 848  Diastolic BP 350 64 97 - 97 - 86  Wt. (Lbs) 193.2 198.75 - 200.1 195.6 202.25 207  BMI 33.16 34.12 - 34.35 33.57 34.72 35.53  Nurse BP re check in 5 weeks

## 2017-07-10 NOTE — Progress Notes (Signed)
   Kaylee Montgomery     MRN: [id number]      DOB: 10/28/1980   HPI [name] is here for follow up and re-evaluation following recent hospitalization for recurrent pancreatitis and large pseudocyst. Still c/o abdominal pain, reports non compliance with BP meds and her BP is elevated  ROS: See HPI Denies sinus pressure, ear pain or sore throat Denies chest congestion or cough Denies vomit or loose stool Denies joint pain or limitation in mobility C/o anxiety , stress and mild depression , concerned about hr health and struggling to get disability income since onset of her illness  PE  BP 128/64 (BP Location: Left Arm, Patient Position: Sitting, Cuff Size: Normal)   Pulse 64   Temp (!) 97 F (36.1 C) (Other (Comment))   Resp 16   Ht 5\' 4"  (1.626 m)   Wt 198 lb 12 oz (90.2 kg)   SpO2 98%   BMI 34.12 kg/m  Recheck BP is 140/96 Patient alert and oriented and in no cardiopulmonary distress.  HEENT: No facial asymmetry, EOMI,   oropharynx pink and moist.  Neck supple no JVD, no mass.  Chest: Clear to auscultation bilaterally.  CVS: S1, S2 no murmurs, no S3.Regular rate.  ABD: Soft generalized superficial tenderness, no guarding or rebound, hyperactive BS   Ext: No edema  MS: Adequate ROM spine, shoulders, hips and knees.  Skin: Intact, no ulcerations or rash noted.  Psych: Good eye contact, normal affect. Memory intact anxious and mildly depressed appearing.  CNS: CN 2-12 intact, power,  normal throughout.no focal deficits noted.   [facility] discharge follow-up Still c/o abdominal pain, not taking blood pressure medication consistently reportedly because of abdominal pain Has upcoming appt at [facility] to drain cyst sooner than the October appt BP management needs assessment , currently uncontrolled however , pt again reports non compliance, counseled about danger of this behvior  Essential hypertension Uncontrolled due to medication non compliance , re  educated re the danger of this behavior DASH diet and commitment to daily physical activity for a minimum of 30 minutes discussed and encouraged, as a part of hypertension management. The importance of attaining a healthy weight is also discussed.  BP/Weight 07/01/2017 06/30/2017 06/27/2017 06/24/2017 06/15/2017 02/13/622 04/24/2830  Systolic BP 517 616 073 - 710 - 626  Diastolic BP 948 64 97 - 97 - 86  Wt. (Lbs) 193.2 198.75 - 200.1 195.6 202.25 207  BMI 33.16 34.12 - 34.35 33.57 34.72 35.53  Nurse BP re check in 5 weeks

## 2017-07-10 NOTE — Assessment & Plan Note (Signed)
Still c/o abdominal pain, not taking blood pressure medication consistently reportedly because of abdominal pain Has upcoming appt at [facility] to drain cyst sooner than the October appt BP management needs assessment , currently uncontrolled however , pt again reports non compliance, counseled about danger of this behvior

## 2017-07-10 NOTE — Telephone Encounter (Signed)
Paperwork completed by Dr. Oneida Alar and faxed to [facility] [phone].

## 2017-07-10 NOTE — Telephone Encounter (Signed)
[place] Oct 19, 2017.

## 2017-07-14 ENCOUNTER — Telehealth: Payer: Self-pay | Admitting: Family Medicine

## 2017-07-14 NOTE — Telephone Encounter (Signed)
Patient was put on an antibiotic @ [facility] during a procedure and since she has been on it symptoms of a yeast infection and is requesting something be called in @ [facility].   cb  [phone]

## 2017-07-15 MED ORDER — FLUCONAZOLE 150 MG PO TABS: 150.0000 mg | ORAL_TABLET | Freq: Every day | ORAL | 0 refills | Status: DC

## 2017-07-20 DIAGNOSIS — K8689 Other specified diseases of pancreas: Secondary | ICD-10-CM | POA: Diagnosis not present

## 2017-07-21 DIAGNOSIS — Z9889 Other specified postprocedural states: Secondary | ICD-10-CM | POA: Diagnosis not present

## 2017-07-21 DIAGNOSIS — K862 Cyst of pancreas: Secondary | ICD-10-CM | POA: Diagnosis not present

## 2017-07-21 DIAGNOSIS — G4733 Obstructive sleep apnea (adult) (pediatric): Secondary | ICD-10-CM | POA: Diagnosis not present

## 2017-07-21 DIAGNOSIS — G43909 Migraine, unspecified, not intractable, without status migrainosus: Secondary | ICD-10-CM | POA: Diagnosis not present

## 2017-07-21 DIAGNOSIS — K8689 Other specified diseases of pancreas: Secondary | ICD-10-CM | POA: Diagnosis not present

## 2017-07-21 DIAGNOSIS — Z903 Acquired absence of stomach [part of]: Secondary | ICD-10-CM | POA: Diagnosis not present

## 2017-07-21 HISTORY — PX: ESOPHAGOGASTRODUODENOSCOPY: SHX1529

## 2017-07-22 ENCOUNTER — Ambulatory Visit: Payer: 59

## 2017-07-22 VITALS — BP 122/84

## 2017-07-22 DIAGNOSIS — I10 Essential (primary) hypertension: Secondary | ICD-10-CM

## 2017-07-23 ENCOUNTER — Inpatient Hospital Stay: Payer: 59 | Admitting: Gastroenterology

## 2017-08-04 ENCOUNTER — Ambulatory Visit: Payer: 59 | Admitting: Family Medicine

## 2017-08-20 DIAGNOSIS — K8689 Other specified diseases of pancreas: Secondary | ICD-10-CM | POA: Diagnosis not present

## 2017-08-20 DIAGNOSIS — K8591 Acute pancreatitis with uninfected necrosis, unspecified: Secondary | ICD-10-CM | POA: Diagnosis not present

## 2017-08-20 DIAGNOSIS — Z48815 Encounter for surgical aftercare following surgery on the digestive system: Secondary | ICD-10-CM | POA: Diagnosis not present

## 2017-08-20 DIAGNOSIS — K828 Other specified diseases of gallbladder: Secondary | ICD-10-CM | POA: Diagnosis not present

## 2017-08-20 DIAGNOSIS — Z452 Encounter for adjustment and management of vascular access device: Secondary | ICD-10-CM | POA: Diagnosis not present

## 2017-08-20 DIAGNOSIS — K8581 Other acute pancreatitis with uninfected necrosis: Secondary | ICD-10-CM | POA: Diagnosis not present

## 2017-08-20 HISTORY — PX: CHOLECYSTECTOMY: SHX55

## 2017-08-25 ENCOUNTER — Encounter: Payer: Self-pay | Admitting: Gastroenterology

## 2017-08-25 ENCOUNTER — Ambulatory Visit (INDEPENDENT_AMBULATORY_CARE_PROVIDER_SITE_OTHER): Payer: 59 | Admitting: Gastroenterology

## 2017-08-25 VITALS — BP 154/107 | HR 89 | Temp 97.0°F | Ht 64.0 in | Wt 193.4 lb

## 2017-08-25 DIAGNOSIS — K8591 Acute pancreatitis with uninfected necrosis, unspecified: Secondary | ICD-10-CM | POA: Diagnosis not present

## 2017-08-25 NOTE — Assessment & Plan Note (Signed)
Very pleasant 36 year old female doing well now s/p necrosectomy, stent placement, now with stent removal planned upcoming. Cholecystectomy also planned 11/23. Without concerning signs/symptoms. Resume Protonix once daily. Continue Creon. Return in 6 months.

## 2017-08-25 NOTE — Patient Instructions (Signed)
I am SO GLAD you are doing well!   We will see you in 6 months.  Please let us know if you need anything. Continue Protonix at just once a day. Continue Creon as you are doing.   Have a great holiday season!

## 2017-08-25 NOTE — Progress Notes (Signed)
CC'ED TO PCP 

## 2017-08-25 NOTE — Progress Notes (Signed)
Referring Provider: [name], MD Primary Care Physician:  [name], MD Primary GI: Dr. Oneida Alar   Chief Complaint  Patient presents with  . Pancreatitis    f/u, doing better    HPI:   Kaylee Montgomery is a 36 y.o. 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 Underwent EUS with FNA l with no evidence of malignancy. Removal of stent scheduled  08/31/17. Cholecystectomy planned for 11/23. History of constipation. Amitiza 24 mcg BID historically. On Creon.    CT scan just completed on 11/1. Yesterday had vague pain on right side. Wasn't sure if it was something she ate or not. Lasted two hours. No N/V. Good appetite. Appetite increased. BM once or twice a week. Not uncomfortable. Doesn't have feeling of discomfort. Not taking Protonix. States she was unable to take it with the medication that was given at time of endoscopic procedures. Will restart this.   Past Medical History:  Diagnosis Date  . Breast mass 02/2017  . Dental crown present   . Hypertension    states BP fluctuates; has been on med. x 1 yr.  . Migraines   . 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 Pituitary adenoma ([place])   . Seasonal allergies   . Sleep apnea    no CPAP use in > 6 mos.    Past Surgical History:  Procedure Laterality Date  . CESAREAN SECTION    . ESOPHAGOGASTRODUODENOSCOPY  07/21/2017   [facility]: Dr. Delrae Alfred. healthy appearing cyst cavity without necrotic material that was decreased in size. Removal of stent scheduled for 11/12.   Marland Kitchen EUS  06/19/2017   [facility]: 7 x 7.3 cm cyst with necrotic material noted adjacent to the body of the pancreas. FNA without malignancy.  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 TONSILLECTOMY  age 62    Current Outpatient Medications    Medication Sig Dispense Refill  . acetaminophen (TYLENOL) 500 MG tablet Take 1,000 mg by mouth every 6 (six) hours as needed.    . ALPRAZolam (XANAX) 0.25 MG tablet One tablet at bedtimeas needed for anxiety 10 tablet 0  . cyclobenzaprine (FLEXERIL) 5 MG tablet Take 1 tablet (5 mg total) by mouth at bedtime. 90 tablet 3  . gabapentin (NEURONTIN) 300 MG capsule Take 300 mg by mouth 2 (two) times daily.     . lipase/protease/amylase (CREON) 36000 UNITS CPEP capsule Take 2 capsules (72,000 Units total) by mouth 3 (three) times daily with meals. 270 capsule 0  . Metoprolol Tartrate 75 MG TABS Take 75 mg by mouth 2 (two) times daily. 60 tablet 5  . traMADol (ULTRAM) 50 MG tablet Take 1 tablet (50 mg total) by mouth every 6 (six) hours as needed. 30 tablet 0   No current facility-administered medications for this visit.     Allergies as of 08/25/2017 - Review Complete 08/25/2017  Allergen Reaction Noted  . Bromocriptine mesylate Nausea Only 07/03/2008  . Depo-medrol [methylprednisolone acetate] Itching and Rash 01/29/2015  . Dilaudid [hydromorphone hcl] Itching 06/26/2017  . Imitrex [sumatriptan] Rash 03/29/2015    Family History  Problem Relation Age of Onset  . Heart disease Father        stent  . Migraines Father   . Migraines Son   . Diabetes Maternal Grandfather   . Colon cancer Neg  Hx     Social History   Socioeconomic History  . Marital status: Single    Spouse name: None  . Number of children: 1  . Years of education: None  . Highest education level: None  Social Needs  . Financial resource strain: None  . Food insecurity - worry: None  . Food insecurity - inability: None  . Transportation needs - medical: None  . Transportation needs - non-medical: None  Occupational History  . Occupation: STUDENT   . Occupation: CASEWORKER    Employer: [employer]  Tobacco Use  . Smoking status: Never Smoker  . Smokeless tobacco: Never Used  Substance and Sexual  Activity  . Alcohol use: No    Alcohol/week: 0.0 oz    Comment: never drank regularly or heavily but NO etoh at all since 01/2017  . Drug use: No  . Sexual activity: None  Other Topics Concern  . None  Social History Narrative   Patient is right handed.   Patient drinks 1-2 cups of caffeine daily.    Review of Systems: Gen: Denies fever, chills, anorexia. Denies fatigue, weakness, weight loss.  CV: Denies chest pain, palpitations, syncope, peripheral edema, and claudication. Resp: Denies dyspnea at rest, cough, wheezing, coughing up blood, and pleurisy. GI: see HPI  Derm: Denies rash, itching, dry skin Psych: Denies depression, anxiety, memory loss, confusion. No homicidal or suicidal ideation.  Heme: Denies bruising, bleeding, and enlarged lymph nodes.  Physical Exam: BP (!) 154/107   Pulse 89   Temp (!) 97 F (36.1 C) (Oral)   Ht 5\' 4"  (1.626 m)   Wt 193 lb 6.4 oz (87.7 kg)   BMI 33.20 kg/m  General:   Alert and oriented. No distress noted. Pleasant and cooperative.  Head:  Normocephalic and atraumatic. Eyes:  Conjuctiva clear without scleral icterus. Mouth:  Oral mucosa pink and moist.  Abdomen:  +BS, soft, non-tender and non-distended. No rebound or guarding. No HSM or masses noted. Msk:  Symmetrical without gross deformities. Normal posture. Extremities:  Without edema. Neurologic:  Alert and  oriented x4 Psych:  Alert and cooperative. Normal mood and affect.

## 2017-08-31 DIAGNOSIS — I10 Essential (primary) hypertension: Secondary | ICD-10-CM | POA: Diagnosis not present

## 2017-08-31 DIAGNOSIS — T182XXA Foreign body in stomach, initial encounter: Secondary | ICD-10-CM | POA: Diagnosis not present

## 2017-08-31 DIAGNOSIS — Z4659 Encounter for fitting and adjustment of other gastrointestinal appliance and device: Secondary | ICD-10-CM | POA: Diagnosis not present

## 2017-08-31 DIAGNOSIS — K319 Disease of stomach and duodenum, unspecified: Secondary | ICD-10-CM | POA: Diagnosis not present

## 2017-08-31 DIAGNOSIS — Z9884 Bariatric surgery status: Secondary | ICD-10-CM | POA: Diagnosis not present

## 2017-08-31 DIAGNOSIS — K862 Cyst of pancreas: Secondary | ICD-10-CM | POA: Diagnosis not present

## 2017-08-31 DIAGNOSIS — Z9049 Acquired absence of other specified parts of digestive tract: Secondary | ICD-10-CM | POA: Diagnosis not present

## 2017-09-08 ENCOUNTER — Encounter: Payer: Self-pay | Admitting: Gastroenterology

## 2017-09-11 DIAGNOSIS — I10 Essential (primary) hypertension: Secondary | ICD-10-CM | POA: Diagnosis not present

## 2017-09-11 DIAGNOSIS — K8591 Acute pancreatitis with uninfected necrosis, unspecified: Secondary | ICD-10-CM | POA: Diagnosis not present

## 2017-09-11 DIAGNOSIS — K828 Other specified diseases of gallbladder: Secondary | ICD-10-CM | POA: Diagnosis not present

## 2017-09-11 DIAGNOSIS — K8010 Calculus of gallbladder with chronic cholecystitis without obstruction: Secondary | ICD-10-CM | POA: Diagnosis not present

## 2017-09-11 DIAGNOSIS — K824 Cholesterolosis of gallbladder: Secondary | ICD-10-CM | POA: Diagnosis not present

## 2017-09-15 DIAGNOSIS — H5202 Hypermetropia, left eye: Secondary | ICD-10-CM | POA: Diagnosis not present

## 2017-09-15 DIAGNOSIS — H52223 Regular astigmatism, bilateral: Secondary | ICD-10-CM | POA: Diagnosis not present

## 2017-09-24 ENCOUNTER — Encounter (HOSPITAL_COMMUNITY): Payer: Self-pay

## 2017-10-27 ENCOUNTER — Encounter: Payer: Self-pay | Admitting: Family Medicine

## 2017-10-27 ENCOUNTER — Ambulatory Visit (INDEPENDENT_AMBULATORY_CARE_PROVIDER_SITE_OTHER): Payer: 59 | Admitting: Family Medicine

## 2017-10-27 VITALS — BP 140/92 | HR 70 | Resp 16 | Ht 64.0 in | Wt 204.0 lb

## 2017-10-27 DIAGNOSIS — G43019 Migraine without aura, intractable, without status migrainosus: Secondary | ICD-10-CM

## 2017-10-27 DIAGNOSIS — I10 Essential (primary) hypertension: Secondary | ICD-10-CM | POA: Diagnosis not present

## 2017-10-27 DIAGNOSIS — F419 Anxiety disorder, unspecified: Secondary | ICD-10-CM | POA: Diagnosis not present

## 2017-10-27 NOTE — Patient Instructions (Addendum)
Physical exam in 4 months, call if you need me before  Work on weight loss and food choice and exercise to lower blood pressure  No med change   Thankful MUCH improved  Any labs needed will be sent to you before next visit by  my chart messaging  Nasal spray for allergies will be sent to [facility]  Thank you  for choosing [facility]. We consider it a privelige to serve you.  Delivering excellent health care in a caring and  compassionate way is our goal.  Partnering with you,  so that together we can achieve this goal is our strategy.

## 2017-11-16 NOTE — Progress Notes (Signed)
   Kaylee Montgomery     MRN: [id number]      DOB: 01-31-1981   HPI Kaylee Montgomery is here for follow up and re-evaluation of chronic medical conditions, medication management and review of any available recent lab and radiology data.  Preventive health is updated, specifically  Cancer screening and Immunization. Refuses TdAP today, just does not want "any needles" been through too much! Ill get in the future Has had cholecystectomy, and u is finally recovered from a long bout of recurrent pancreatitis, still has some anxiety but very mild, denies abdominal pain, but stil cautious about what she does eat The PT denies any adverse reactions to current medications since the last visit.  There are no new concerns.  There are no specific complaints   ROS Denies recent fever or chills. Denies sinus pressure, nasal congestion, ear pain or sore throat. Denies chest congestion, productive cough or wheezing. Denies chest pains, palpitations and leg swelling C/o intermittent nausea Denies dysuria, frequency, hesitancy or incontinence. Denies joint pain, swelling and limitation in mobility. Denies headaches, seizures, numbness, or tingling. Denies depression,  or insomnia. Denies skin break down or rash.   PE  BP (!) 140/92   Pulse 70   Resp 16   Ht 5\' 4"  (1.626 m)   Wt 204 lb (92.5 kg)   SpO2 97%   BMI 35.02 kg/m   Patient alert and oriented and in no cardiopulmonary distress.  HEENT: No facial asymmetry, EOMI,   oropharynx pink and moist.  Neck supple no JVD, no mass.  Chest: Clear to auscultation bilaterally.  CVS: S1, S2 no murmurs, no S3.Regular rate.  ABD: Soft non tender.   Ext: No edema  MS: Adequate ROM spine, shoulders, hips and knees.  Skin: Intact, no ulcerations or rash noted.  Psych: Good eye contact, normal affect. Memory intact not anxious or depressed appearing.  CNS: CN 2-12 intact, power,  normal throughout.no focal deficits noted.   Assessment &  Plan  Essential hypertension Inadequate control, hopefully lifestyle change will get her to goal over the next several months DASH diet and commitment to daily physical activity for a minimum of 30 minutes discussed and encouraged, as a part of hypertension management. The importance of attaining a healthy weight is also discussed.  BP/Weight 10/27/2017 08/25/2017 07/22/2017 07/01/2017 06/30/2017 02/18/7781 01/20/3535  Systolic BP 144 315 400 867 619 509 -  Diastolic BP 92 326 84 712 64 97 -  Wt. (Lbs) 204 193.4 - 193.2 198.75 - 200.1  BMI 35.02 33.2 - 33.16 34.12 - 34.35       Morbid obesity (HCC) Deteriorated. Patient re-educated about  the importance of commitment to a  minimum of 150 minutes of exercise per week.  The importance of healthy food choices with portion control discussed. Encouraged to start a food diary, count calories and to consider  joining a support group. Sample diet sheets offered. Goals set by the patient for the next several months.   Weight /BMI 10/27/2017 08/25/2017 07/01/2017  WEIGHT 204 lb 193 lb 6.4 oz 193 lb 3.2 oz  HEIGHT 5\' 4"  5\' 4"  5\' 4"   BMI 35.02 kg/m2 33.2 kg/m2 33.16 kg/m2      Common migraine with intractable migraine Controlled on current regime , reports few migraines, and the are controlled on prescribed medication when they do occur  Anxiety Markedly improved, no reliance on xanax at this time , since her pancreatitis is treated

## 2017-11-17 NOTE — Assessment & Plan Note (Signed)
Deteriorated. Patient re-educated about  the importance of commitment to a  minimum of 150 minutes of exercise per week.  The importance of healthy food choices with portion control discussed. Encouraged to start a food diary, count calories and to consider  joining a support group. Sample diet sheets offered. Goals set by the patient for the next several months.   Weight /BMI 10/27/2017 08/25/2017 07/01/2017  WEIGHT 204 lb 193 lb 6.4 oz 193 lb 3.2 oz  HEIGHT 5\' 4"  5\' 4"  5\' 4"   BMI 35.02 kg/m2 33.2 kg/m2 33.16 kg/m2

## 2017-11-17 NOTE — Assessment & Plan Note (Signed)
Controlled on current regime , reports few migraines, and the are controlled on prescribed medication when they do occur

## 2017-11-17 NOTE — Assessment & Plan Note (Signed)
Inadequate control, hopefully lifestyle change will get her to goal over the next several months DASH diet and commitment to daily physical activity for a minimum of 30 minutes discussed and encouraged, as a part of hypertension management. The importance of attaining a healthy weight is also discussed.  BP/Weight 10/27/2017 08/25/2017 07/22/2017 07/01/2017 06/30/2017 11/27/4130 01/21/101  Systolic BP 725 366 440 347 425 956 -  Diastolic BP 92 387 84 564 64 97 -  Wt. (Lbs) 204 193.4 - 193.2 198.75 - 200.1  BMI 35.02 33.2 - 33.16 34.12 - 34.35

## 2017-11-17 NOTE — Assessment & Plan Note (Signed)
Markedly improved, no reliance on xanax at this time , since her pancreatitis is treated

## 2017-11-26 ENCOUNTER — Encounter: Payer: Self-pay | Admitting: Neurology

## 2017-11-26 ENCOUNTER — Ambulatory Visit: Payer: 59 | Admitting: Neurology

## 2017-11-26 VITALS — BP 151/100 | HR 78 | Ht 64.0 in | Wt 206.5 lb

## 2017-11-26 DIAGNOSIS — M542 Cervicalgia: Secondary | ICD-10-CM

## 2017-11-26 DIAGNOSIS — G43019 Migraine without aura, intractable, without status migrainosus: Secondary | ICD-10-CM | POA: Diagnosis not present

## 2017-11-26 MED ORDER — GABAPENTIN 300 MG PO CAPS: 300.0000 mg | ORAL_CAPSULE | Freq: Every day | ORAL | 3 refills | Status: DC

## 2017-11-26 MED ORDER — METOPROLOL TARTRATE 75 MG PO TABS: 75.0000 mg | ORAL_TABLET | Freq: Two times a day (BID) | ORAL | 3 refills | Status: DC

## 2017-11-26 MED ORDER — CYCLOBENZAPRINE HCL 5 MG PO TABS: 5.0000 mg | ORAL_TABLET | Freq: Every day | ORAL | 3 refills | Status: DC

## 2017-11-26 MED ORDER — BETAMETHASONE SOD PHOS & ACET 6 (3-3) MG/ML IJ SUSP
6.5000 mg | Freq: Once | INTRAMUSCULAR | Status: AC
  Administered 2017-11-26: 6.6 mg via INTRAMUSCULAR

## 2017-11-26 MED FILL — CYCLOBENZAPRINE 5 MG TABLET: 5 | 30 days supply | Qty: 30 | Fill #0

## 2017-11-26 MED FILL — GABAPENTIN 300 MG CAPSULE: 300 | 30 days supply | Qty: 30 | Fill #0

## 2017-11-26 MED FILL — METOPROLOL TARTRATE 50 MG T: 50 | 30 days supply | Qty: 90 | Fill #0

## 2017-11-26 NOTE — Progress Notes (Signed)
Reason for visit: Migraine headache  Kaylee Montgomery is an 37 y.o. female  History of present illness:  Kaylee Montgomery is a 37 year old right-handed black female with a history of migraine headaches and some muscle tension type headaches coming up from the neck.  The patient does have some neck stiffness.  She had been out of work for about 6 months with pancreatitis, her headaches during that period of time improved to only one a month.  The patient ran out of her Flexeril about 30 days ago when she began having headaches within the last 2 weeks.  The patient may get photophobia, phonophobia, nausea with the headache.  She indicates that perfume odors and cold weather may bring on her headache.  The patient returns to the office today for an evaluation.  Past Medical History:  Diagnosis Date  . Breast mass 02/2017  . Dental crown present   . Hypertension    states BP fluctuates; has been on med. x 1 yr.  . Migraines   . 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 Pituitary adenoma ([place])   . Seasonal allergies   . Sleep apnea    no CPAP use in > 6 mos.    Past Surgical History:  Procedure Laterality Date  . BREAST LUMPECTOMY WITH RADIOACTIVE SEED LOCALIZATION Left 03/05/2017   Procedure: LEFT BREAST LUMPECTOMY WITH RADIOACTIVE SEED LOCALIZATION;  Surgeon: Donnie Mesa, MD;  Location: [facility];  Service: General;  Laterality: Left;  . CESAREAN SECTION    . ESOPHAGOGASTRODUODENOSCOPY  07/21/2017   [facility]: Dr. Delrae Alfred. healthy appearing cyst cavity without necrotic material that was decreased in size. Removal of stent scheduled for 11/12.   Marland Kitchen EUS  06/19/2017   [facility]: 7 x 7.3 cm cyst with necrotic material noted adjacent to the body of the pancreas. FNA without malignancy.  Marland Kitchen LAPAROSCOPIC GASTRIC SLEEVE RESECTION N/A 02/18/2016   Procedure: LAPAROSCOPIC GASTRIC SLEEVE RESECTION, UPPER ENDO;  Surgeon: Greer Pickerel, MD;  Location: [facility];  Service:  General;  Laterality: N/A;  . 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 TONSILLECTOMY  age 30    Family History  Problem Relation Age of Onset  . Heart disease Father        stent  . Migraines Father   . Migraines Son   . Diabetes Maternal Grandfather   . Colon cancer Neg Hx     Social history:  reports that  has never smoked. she has never used smokeless tobacco. She reports that she does not drink alcohol or use drugs.    Allergies  Allergen Reactions  . Bromocriptine Mesylate Nausea Only  . Depo-Medrol [Methylprednisolone Acetate] Itching and Rash  . Dilaudid [Hydromorphone Hcl] Itching  . Imitrex [Sumatriptan] Rash    Medications:  Prior to Admission medications   Medication Sig Start Date End Date Taking? Authorizing Provider  acetaminophen (TYLENOL) 500 MG tablet Take 1,000 mg by mouth every 6 (six) hours as needed.   Yes [provider]  cyclobenzaprine (FLEXERIL) 5 MG tablet Take 1 tablet (5 mg total) by mouth at bedtime. 11/26/17  Yes Kathrynn Ducking, MD  gabapentin (NEURONTIN) 300 MG capsule Take 1 capsule (300 mg total) by mouth daily. 11/26/17  Yes Kathrynn Ducking, MD  Metoprolol Tartrate 75 MG TABS Take 75 mg by mouth 2 (two) times daily. 11/26/17  Yes Kathrynn Ducking, MD    ROS:  Out of a complete 14 system review of symptoms, the patient complains only of the following symptoms, and all other reviewed systems are negative.  Headache Neck pain, neck stiffness  Blood pressure (!) 151/100, pulse 78, height 5\' 4"  (1.626 m), weight 206 lb 8 oz (93.7 kg).  Physical Exam  General: The patient is alert and cooperative at the time of the examination.  The patient is moderately to markedly obese.  Neuromuscular: Range of movement the cervical spine is relatively full.  Skin: No significant peripheral edema is noted.   Neurologic Exam  Mental status: The patient is alert and  oriented x 3 at the time of the examination. The patient has apparent normal recent and remote memory, with an apparently normal attention span and concentration ability.   Cranial nerves: Facial symmetry is present. Speech is normal, no aphasia or dysarthria is noted. Extraocular movements are full. Visual fields are full.  Motor: The patient has good strength in all 4 extremities.  Sensory examination: Soft touch sensation is symmetric on the face, arms, and legs.  Coordination: The patient has good finger-nose-finger and heel-to-shin bilaterally.  Gait and station: The patient has a normal gait. Tandem gait is normal. Romberg is negative. No drift is seen.  Reflexes: Deep tendon reflexes are symmetric.   Assessment/Plan:  1.  Migraine headache  2.  Cervicogenic headache  The patient will be placed back on the Flexeril 5 mg at night.  He will continue the metoprolol and the gabapentin.  A prescription was sent in for these medications.  The patient may be transitioned to nortriptyline if the Flexeril is not effective for the headache.  The patient wishes to have occipital nerve and trigger point injections today.  Jill Alexanders MD 11/26/2017 10:33 AM  [facility] [address] [place], [place] [address]  Phone [phone] Fax [phone]

## 2017-11-26 NOTE — Procedures (Signed)
   History: Kaylee Montgomery is a 37 year old patient with a history of intractable migraine headaches and some chronic neck and shoulder discomfort.  The shoulder discomfort is worse on the right than the left.  She is having occipital headaches as well bilaterally.  She comes in for trigger point injections and nerve blocks today.   Bupivicaine and betamethasone injection protocol for headache  Bupivacaine 0.5% mixed with betamethasone (30 mg/ 5 cc) mixed  one-to-one ratio was injected on the scalp and shoulder on the right at  several locations:  -On the occipital area of the head, 3 injections on both sides,  1 cc per injection at the midpoint between the mastoid process  and the occipital protuberance. 2 other injections were done one  finger breadth from the initial injection, one at a 10 o'clock  position and the other at a 2 o'clock position.  -3 trigger point injections were done along the upper trapezius  muscle on the right shoulder to include the mid cervical level. 1  mL was injected at each site.  2 injections of 1 cc were done bilaterally in the upper cervical mid cervical paraspinal areas.  The patient tolerated the injections well, no complications of  the procedure were noted. Injections were made with a 27-gauge  needle. A total of 13 mL of solution were injected.  Bupivacaine 0.5%:  NDC number is [id number].  Lot number is [id number]. Expriation date is March 2020.  Betamethasone 30mg [name]:  [place] # [id number]  Lot number: [id number]  Expiration date: August 2020

## 2017-12-29 MED FILL — CYCLOBENZAPRINE 5 MG TABLET: 5 | 30 days supply | Qty: 30 | Fill #1

## 2018-01-12 MED FILL — GABAPENTIN 300 MG CAPSULE: 300 | 30 days supply | Qty: 30 | Fill #1

## 2018-01-27 MED FILL — CYCLOBENZAPRINE 5 MG TABLET: 5 | 30 days supply | Qty: 30 | Fill #2

## 2018-01-27 MED FILL — METOPROLOL TARTRATE 50 MG T: 50 | 30 days supply | Qty: 90 | Fill #1

## 2018-02-03 ENCOUNTER — Other Ambulatory Visit: Payer: Self-pay

## 2018-02-03 ENCOUNTER — Telehealth: Payer: Self-pay | Admitting: Gastroenterology

## 2018-02-03 DIAGNOSIS — K8591 Acute pancreatitis with uninfected necrosis, unspecified: Secondary | ICD-10-CM

## 2018-02-03 NOTE — Telephone Encounter (Signed)
Called pt, she is ok to have CT done before her appt. Informed her she will need to have CT at [place] d/t her insurance requirement.  Vicente Males, she states she can't drink contrast and wants to know what kind of CT she will have. She vomited contrast in the past.

## 2018-02-03 NOTE — Telephone Encounter (Signed)
CT with pancreatic protocol. I believe that needs oral contrast? Can check with radiology to see how best we can do this as she didn't do well with oral contrast before.

## 2018-02-03 NOTE — Telephone Encounter (Signed)
Upcoming appt in May 2019. We will complete CT at that time to follow due to history of necrotizing pancreatitis as per [facility] recommendations.  Does she want to go ahead and order now so we can discuss results in May?

## 2018-02-04 NOTE — Telephone Encounter (Signed)
Late entry for 02/03/18: Called [facility] CT and spoke to [facility]. She advised the CT abd w/wo contrast, pancreatic protocol doesn't require drinking contrast. Called and informed pt. Order entered for [place]. Tried to call [facility] x2 to inform of order entered and waited extended time for someone to answer call.

## 2018-02-04 NOTE — Telephone Encounter (Addendum)
Submitted PA for CT abd w/wo contrast via [facility] website. Option wasn't given to provide further information or fax clinicals. Case requires Physician-to-Physician discussion. "Based on the clinical information provided, this request has not demonstrated consistency with evidence based clinical guidelines. Your notification number request has been assigned Case Number [id number]."   A Physician-to-Physician discussion is required to complete the Notification Process. The ordering physician must call [phone] and select option #3 to engage in a Physician-to-Physician discussion. Please ensure the physician has the case number provided above available when making this call. If a Physician-to-Physician discussion is not conducted within 3 business days, this notification number request will be deemed expired and must reinitiate the Notification Process.  Kaylee Montgomery, pt will be going to [facility] [address] Tax ID: [id number].  Called [facility] and spoke to [place]. Informed her of order in Epic. Also informed her case was started online and will require physician-to-physician discussion.

## 2018-02-10 ENCOUNTER — Encounter: Payer: 59 | Admitting: Family Medicine

## 2018-02-12 MED FILL — GABAPENTIN 300 MG CAPSULE: 300 | 30 days supply | Qty: 30 | Fill #2

## 2018-02-15 NOTE — Telephone Encounter (Signed)
Spoke to AB, will wait till pt is seen in office to schedule CT to document pt unable to drink contrast.

## 2018-02-15 NOTE — Telephone Encounter (Signed)
Patient called stating that [place] informed her since she did not do well with the oral contrast she needs to see about having this done at the hospital.

## 2018-02-16 NOTE — Telephone Encounter (Signed)
Called and informed pt, will wait until OV to schedule CT so it can be documented she is unable to drink contrast so we can try to authorize CT to be done in hospital setting.

## 2018-02-22 ENCOUNTER — Ambulatory Visit: Payer: 59 | Admitting: Gastroenterology

## 2018-02-22 ENCOUNTER — Encounter: Payer: Self-pay | Admitting: Gastroenterology

## 2018-02-22 VITALS — BP 162/102 | HR 77 | Temp 97.1°F | Ht 64.0 in | Wt 216.4 lb

## 2018-02-22 DIAGNOSIS — K8591 Acute pancreatitis with uninfected necrosis, unspecified: Secondary | ICD-10-CM

## 2018-02-22 NOTE — Patient Instructions (Signed)
We have scheduled the follow-up CT for you.  We will see you back as needed!  I am glad you are doing so well!  It was a pleasure to see you today. I strive to create trusting relationships with patients to provide genuine, compassionate, and quality care. I value your feedback. If you receive a survey regarding your visit,  I greatly appreciate you taking time to fill this out.   Annitta Needs, PhD, ANP-BC [facility]

## 2018-02-22 NOTE — Telephone Encounter (Signed)
Called [employer] regarding prior PA as online [facility] website states this is still active. Spoke with Danae Chen and PA was approved. Approval # [id number], dates 02/22/18-04/08/18.   Spoke with pt and CT scheduled for 03/11/18, arrival time 8:45am, npo after midnight prior to test. Appointment letter mailed.

## 2018-02-22 NOTE — Progress Notes (Signed)
Primary Care Physician:  [name], MD  Primary GI: Dr. Oneida Alar   Chief Complaint  Patient presents with  . necrotizing pancreatitis    f/u, doing ok    HPI:   Kaylee Montgomery is a 37 y.o. 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 Underwent EUS with FNA with no evidence of malignancy.Endoscopic necrosectomy. Removal of stent 08/31/17. Cholecystectomy Nov 2018. Last CT Nov 2018 at [facility] with interval resolution of complex pancreatic collection s/p cyst gastrostomy.    She is due for surveillance CT now. We were unable to get this scheduled prior to visit due to insurance issues. She has been unable to tolerate oral contrast in the past due to vomiting. She does well with IV contrast. We will attempt to have this done at [facility] with pancreatic protocol.   BP up: having more headaches lately. Under stress lately. Death in the family. Was inadvertently only taking one dose of BP medication instead of BID. Upcoming appt with Dr. Moshe Cipro in future. No abdominal pain, N/V, constipation. Intermittent loose stool since cholecystectomy. Overall, she feels great and much improved. Back at work. She wonders if she can ever have alcohol again. I have cautioned her to avoid this completely.   Past Medical History:  Diagnosis Date  . Breast mass 02/2017  . Dental crown present   . Hypertension    states BP fluctuates; has been on med. x 1 yr.  . Migraines   . 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 Pituitary adenoma ([place])   . Seasonal allergies   . Sleep apnea    no CPAP use in > 6 mos.    Past Surgical History:  Procedure Laterality Date  . BREAST LUMPECTOMY WITH RADIOACTIVE SEED LOCALIZATION Left 03/05/2017   Procedure: LEFT BREAST LUMPECTOMY WITH RADIOACTIVE SEED LOCALIZATION;  Surgeon: Donnie Mesa, MD;  Location: [facility];   Service: General;  Laterality: Left;  . CESAREAN SECTION    . CHOLECYSTECTOMY  08/2017   [facility]  . ESOPHAGOGASTRODUODENOSCOPY  07/21/2017   [facility]: Dr. Delrae Alfred. healthy appearing cyst cavity without necrotic material that was decreased in size. Removal of stent scheduled for 11/12.   Marland Kitchen EUS  06/19/2017   [facility]: 7 x 7.3 cm cyst with necrotic material noted adjacent to the body of the pancreas. FNA without malignancy.  Marland Kitchen LAPAROSCOPIC GASTRIC SLEEVE RESECTION N/A 02/18/2016   Procedure: LAPAROSCOPIC GASTRIC SLEEVE RESECTION, UPPER ENDO;  Surgeon: Greer Pickerel, MD;  Location: [facility];  Service: General;  Laterality: N/A;  . 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 TONSILLECTOMY  age 37    Current Outpatient Medications  Medication Sig Dispense Refill  . acetaminophen (TYLENOL) 500 MG tablet Take 1,000 mg by mouth every 6 (six) hours as needed.    . cyclobenzaprine (FLEXERIL) 5 MG tablet Take 1 tablet (5 mg total) by mouth at bedtime. 90 tablet 3  . gabapentin (NEURONTIN) 300 MG capsule Take 1 capsule (300 mg total) by mouth daily. 90 capsule 3  . Metoprolol Tartrate 75 MG TABS Take 75 mg by mouth 2 (two) times daily. 180 tablet 3   No current facility-administered medications for this visit.     Allergies as of 02/22/2018 - Review Complete 02/22/2018  Allergen Reaction Noted  . Bromocriptine mesylate Nausea Only 07/03/2008  . Depo-medrol [methylprednisolone  acetate] Itching and Rash 01/29/2015  . Dilaudid [hydromorphone hcl] Itching 06/26/2017  . Imitrex [sumatriptan] Rash 03/29/2015    Family History  Problem Relation Age of Onset  . Heart disease Father        stent  . Migraines Father   . Migraines Son   . Diabetes Maternal Grandfather   . Colon cancer Neg Hx     Social History   Socioeconomic History  . Marital status: Single    Spouse name: Not on file  . Number of children: 1  . Years of education:  Not on file  . Highest education level: Not on file  Occupational History  . Occupation: STUDENT   . Occupation: CASEWORKER    Employer: [employer]  Social Needs  . Financial resource strain: Not on file  . Food insecurity:    Worry: Not on file    Inability: Not on file  . Transportation needs:    Medical: Not on file    Non-medical: Not on file  Tobacco Use  . Smoking status: Never Smoker  . Smokeless tobacco: Never Used  Substance and Sexual Activity  . Alcohol use: No    Alcohol/week: 0.0 oz    Comment: never drank regularly or heavily but NO etoh at all since 01/2017  . Drug use: No  . Sexual activity: Not on file  Lifestyle  . Physical activity:    Days per week: Not on file    Minutes per session: Not on file  . Stress: Not on file  Relationships  . Social connections:    Talks on phone: Not on file    Gets together: Not on file    Attends religious service: Not on file    Active member of club or organization: Not on file    Attends meetings of clubs or organizations: Not on file    Relationship status: Not on file  Other Topics Concern  . Not on file  Social History Narrative   Patient is right handed.   Patient drinks 1-2 cups of caffeine daily.    Review of Systems: As mentioned in HPI   Physical Exam: BP (!) 162/102   Pulse 77   Temp (!) 97.1 F (36.2 C) (Oral)   Ht 5\' 4"  (1.626 m)   Wt 216 lb 6.4 oz (98.2 kg)   BMI 37.14 kg/m  General:   Alert and oriented. No distress noted. Pleasant and cooperative.  Head:  Normocephalic and atraumatic. Eyes:  Conjuctiva clear without scleral icterus. Mouth:  Oral mucosa pink and moist. Abdomen:  +BS, soft, non-tender and non-distended. No rebound or guarding. No HSM or masses noted. Msk:  Symmetrical without gross deformities. Normal posture. Extremities:  Without edema. Neurologic:  Alert and  oriented x4 Psych:  Alert and cooperative. Normal mood and affect.

## 2018-02-22 NOTE — Assessment & Plan Note (Signed)
37 year old female with history of necrotizing pancreatitis June 2018, s/p necrosectomy, stent placement and removal. Cholecystectomy completed at [facility] Nov 2018. Doing quite well. Due for surveillance CT now. Next follow-up to be determined. She is curious about drinking alcohol (socially), and I have advised to avoid this. Also of note: intermittent BP fluctuations, and she has not been taking metoprolol BID as she thought this was only daily. I have asked her to monitor her BP, symptoms, and keep upcoming appt with PCP.

## 2018-02-23 NOTE — Progress Notes (Signed)
cc'ed to pcp °

## 2018-03-01 MED FILL — CYCLOBENZAPRINE 5 MG TABLET: 5 | 30 days supply | Qty: 30 | Fill #3

## 2018-03-01 MED FILL — METOPROLOL TARTRATE 50 MG T: 50 | 30 days supply | Qty: 90 | Fill #2

## 2018-03-10 ENCOUNTER — Telehealth: Payer: Self-pay | Admitting: Family Medicine

## 2018-03-10 ENCOUNTER — Encounter: Payer: Self-pay | Admitting: Family Medicine

## 2018-03-10 DIAGNOSIS — R7301 Impaired fasting glucose: Secondary | ICD-10-CM

## 2018-03-10 DIAGNOSIS — E119 Type 2 diabetes mellitus without complications: Secondary | ICD-10-CM | POA: Diagnosis not present

## 2018-03-10 NOTE — Telephone Encounter (Signed)
pls order fasting chem 7 and eGFR and hBA1C

## 2018-03-10 NOTE — Telephone Encounter (Signed)
Do you want to order labs on her first?

## 2018-03-10 NOTE — Telephone Encounter (Signed)
Pt went to eye dr, and they advised to see PCP urgently as her blood sugar levels are elavated, with blurred vision.-- Thirsty, frequency to the bathroom, blurred vision..--[phone]

## 2018-03-11 ENCOUNTER — Emergency Department (HOSPITAL_COMMUNITY)
Admission: EM | Admit: 2018-03-11 | Discharge: 2018-03-11 | Disposition: A | Discharge status: Home / Self Care | Payer: 59 | Attending: Emergency Medicine | Admitting: Emergency Medicine

## 2018-03-11 ENCOUNTER — Other Ambulatory Visit: Payer: Self-pay

## 2018-03-11 ENCOUNTER — Encounter (HOSPITAL_COMMUNITY): Payer: Self-pay | Admitting: Emergency Medicine

## 2018-03-11 ENCOUNTER — Ambulatory Visit (HOSPITAL_COMMUNITY)
Admission: RE | Admit: 2018-03-11 | Discharge: 2018-03-11 | Disposition: A | Discharge status: Home / Self Care | Payer: 59 | Source: Ambulatory Visit | Attending: Gastroenterology | Admitting: Gastroenterology

## 2018-03-11 DIAGNOSIS — Z833 Family history of diabetes mellitus: Secondary | ICD-10-CM | POA: Insufficient documentation

## 2018-03-11 DIAGNOSIS — K760 Fatty (change of) liver, not elsewhere classified: Secondary | ICD-10-CM | POA: Insufficient documentation

## 2018-03-11 DIAGNOSIS — E1165 Type 2 diabetes mellitus with hyperglycemia: Secondary | ICD-10-CM | POA: Diagnosis not present

## 2018-03-11 DIAGNOSIS — I10 Essential (primary) hypertension: Secondary | ICD-10-CM | POA: Insufficient documentation

## 2018-03-11 DIAGNOSIS — K8592 Acute pancreatitis with infected necrosis, unspecified: Secondary | ICD-10-CM | POA: Diagnosis not present

## 2018-03-11 DIAGNOSIS — B379 Candidiasis, unspecified: Secondary | ICD-10-CM

## 2018-03-11 DIAGNOSIS — R739 Hyperglycemia, unspecified: Secondary | ICD-10-CM | POA: Insufficient documentation

## 2018-03-11 DIAGNOSIS — B3789 Other sites of candidiasis: Secondary | ICD-10-CM | POA: Insufficient documentation

## 2018-03-11 DIAGNOSIS — H538 Other visual disturbances: Secondary | ICD-10-CM | POA: Diagnosis present

## 2018-03-11 DIAGNOSIS — K8591 Acute pancreatitis with uninfected necrosis, unspecified: Secondary | ICD-10-CM | POA: Insufficient documentation

## 2018-03-11 DIAGNOSIS — R7301 Impaired fasting glucose: Secondary | ICD-10-CM | POA: Diagnosis not present

## 2018-03-11 DIAGNOSIS — I82891 Chronic embolism and thrombosis of other specified veins: Secondary | ICD-10-CM | POA: Insufficient documentation

## 2018-03-11 LAB — URINALYSIS, ROUTINE W REFLEX MICROSCOPIC
Bilirubin Urine: NEGATIVE
Glucose, UA: >=500 mg/dL — AB
Ketones, ur: 5 mg/dL — AB
Nitrite: NEGATIVE
Protein, ur: NEGATIVE mg/dL
Specific Gravity, Urine: 1.041 — ABNORMAL HIGH (ref 1.005–1.030)
pH: 6 (ref 5.0–8.0)

## 2018-03-11 LAB — BASIC METABOLIC PANEL
Anion gap: 11 (ref 5–15)
BUN: 8 mg/dL (ref 6–20)
CO2: 25 mmol/L (ref 22–32)
Calcium: 9.5 mg/dL (ref 8.9–10.3)
Chloride: 98 mmol/L — ABNORMAL LOW (ref 101–111)
Creatinine, Ser: 0.67 mg/dL (ref 0.44–1.00)
GFR calc Af Amer: >60 mL/min (ref >60)
GFR calc non Af Amer: >60 mL/min (ref >60)
Glucose, Bld: 447 mg/dL — ABNORMAL HIGH (ref 65–99)
Potassium: 3.6 mmol/L (ref 3.5–5.1)
Sodium: 134 mmol/L — ABNORMAL LOW (ref 135–145)

## 2018-03-11 LAB — CBC WITH DIFFERENTIAL/PLATELET
Basophils Absolute: 0 10*3/uL (ref 0.0–0.1)
Basophils Relative: 0 %
Eosinophils Absolute: 0.1 10*3/uL (ref 0.0–0.7)
Eosinophils Relative: 1 %
HCT: 39.5 % (ref 36.0–46.0)
Hemoglobin: 12.9 g/dL (ref 12.0–15.0)
Lymphocytes Relative: 33 %
Lymphs Abs: 2.3 10*3/uL (ref 0.7–4.0)
MCH: 25.2 pg — ABNORMAL LOW (ref 26.0–34.0)
MCHC: 32.7 g/dL (ref 30.0–36.0)
MCV: 77.1 fL — ABNORMAL LOW (ref 78.0–100.0)
Monocytes Absolute: 0.4 10*3/uL (ref 0.1–1.0)
Monocytes Relative: 6 %
Neutro Abs: 4.2 10*3/uL (ref 1.7–7.7)
Neutrophils Relative %: 60 %
Platelets: 245 10*3/uL (ref 150–400)
RBC: 5.12 MIL/uL — ABNORMAL HIGH (ref 3.87–5.11)
RDW: 12.3 % (ref 11.5–15.5)
WBC: 7 10*3/uL (ref 4.0–10.5)

## 2018-03-11 LAB — CBG MONITORING, ED
Glucose-Capillary: 482 mg/dL — ABNORMAL HIGH (ref 65–99)
Glucose-Capillary: 335 mg/dL — ABNORMAL HIGH (ref 65–99)

## 2018-03-11 MED ORDER — LACTATED RINGERS IV BOLUS
1000.0000 mL | Freq: Once | INTRAVENOUS | Status: AC
  Administered 2018-03-11: 1000 mL via INTRAVENOUS

## 2018-03-11 MED ORDER — FLUCONAZOLE 150 MG PO TABS: 150.0000 mg | ORAL_TABLET | Freq: Once | ORAL | 0 refills | Status: DC | PRN

## 2018-03-11 MED ORDER — CEPHALEXIN 500 MG PO CAPS: 500.0000 mg | ORAL_CAPSULE | Freq: Four times a day (QID) | ORAL | 0 refills | Status: DC

## 2018-03-11 MED ORDER — METFORMIN HCL 500 MG PO TABS: 500.0000 mg | ORAL_TABLET | Freq: Two times a day (BID) | ORAL | 0 refills | Status: DC

## 2018-03-11 MED ORDER — FLUCONAZOLE 150 MG PO TABS
150.0000 mg | ORAL_TABLET | Freq: Once | ORAL | Status: AC
  Administered 2018-03-11: 150 mg via ORAL
  Filled 2018-03-11: qty 1

## 2018-03-11 MED ORDER — IOPAMIDOL (ISOVUE-300) INJECTION 61%
100.0000 mL | Freq: Once | INTRAVENOUS | Status: AC | PRN
  Administered 2018-03-11: 100 mL via INTRAVENOUS

## 2018-03-11 MED ORDER — SODIUM CHLORIDE 0.9 % IV SOLN
1.0000 g | Freq: Once | INTRAVENOUS | Status: AC
  Administered 2018-03-11: 1 g via INTRAVENOUS
  Filled 2018-03-11: qty 10

## 2018-03-11 MED ORDER — METFORMIN HCL 500 MG PO TABS
500.0000 mg | ORAL_TABLET | Freq: Once | ORAL | Status: AC
  Administered 2018-03-11: 500 mg via ORAL
  Filled 2018-03-11: qty 1

## 2018-03-11 MED ORDER — SODIUM CHLORIDE 0.9 % IV BOLUS
1000.0000 mL | Freq: Once | INTRAVENOUS | Status: AC
  Administered 2018-03-11: 1000 mL via INTRAVENOUS

## 2018-03-11 NOTE — ED Provider Notes (Signed)
Emergency Department Provider Note   I have reviewed the triage vital signs and the nursing notes.   HISTORY  Chief Complaint Blurred Vision   HPI Kaylee Montgomery is a 37 y.o. female with multiple medical problems documented below the presents the emergency department today mainly because of blurred vision.  Patient states that she started having blurry vision on Saturday when she woke up and progressively worsened over the next couple days.  She thought maybe she got something in her eyes so that there is something wrong with her glasses so she went to follow-up with the eye doctor couple days ago and they said that they thought she had diabetes and there is nothing wrong with her eyes.  She is a persistently in the worsening polyuria, polydipsia and blurry vision since that time.  She states that she recently has had pretty significant bouts of pancreatitis secondary to gallstones it sounds like.  She recently had her gallbladder taken out.  Her parents had diabetes but no other family history.  Patient is never had any issues with this before. No other associated or modifying symptoms.    Past Medical History:  Diagnosis Date  . Breast mass 02/2017  . Dental crown present   . Hypertension    states BP fluctuates; has been on med. x 1 yr.  . Migraines   . 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 Pituitary adenoma ([place])   . Seasonal allergies   . Sleep apnea    no CPAP use in > 6 mos.    Patient Active Problem List   Diagnosis Date Noted  . Constipation 06/15/2017  . Anxiety 06/03/2017  . 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 Anemia   . Transaminitis   . Necrotizing pancreatitis 04/13/2017  . Dense breast tissue 01/13/2017  . Fatty liver 02/18/2016  . 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 Common migraine with intractable migraine 02/23/2015  . Obstructive sleep apnea 10/19/2013  . Prediabetes 04/30/2013  . Essential hypertension  07/17/2008  . 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 PITUITARY ADENOMA 02/17/2008  . Morbid obesity ([place]) 02/17/2008  . Seasonal and perennial allergic rhinitis 02/17/2008  . GYNECOMASTIA 02/17/2008    Past Surgical History:  Procedure Laterality Date  . BREAST LUMPECTOMY WITH RADIOACTIVE SEED LOCALIZATION Left 03/05/2017   Procedure: LEFT BREAST LUMPECTOMY WITH RADIOACTIVE SEED LOCALIZATION;  Surgeon: Donnie Mesa, MD;  Location: [place];  Service: General;  Laterality: Left;  . CESAREAN SECTION    . CHOLECYSTECTOMY  08/2017   [facility]  . ESOPHAGOGASTRODUODENOSCOPY  07/21/2017   [facility]: Dr. Delrae Alfred. healthy appearing cyst cavity without necrotic material that was decreased in size. Removal of stent scheduled for 11/12.   Marland Kitchen EUS  06/19/2017   [facility]: 7 x 7.3 cm cyst with necrotic material noted adjacent to the body of the pancreas. FNA without malignancy.  Marland Kitchen LAPAROSCOPIC GASTRIC SLEEVE RESECTION N/A 02/18/2016   Procedure: LAPAROSCOPIC GASTRIC SLEEVE RESECTION, UPPER ENDO;  Surgeon: Greer Pickerel, MD;  Location: [facility];  Service: General;  Laterality: N/A;  . 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 TONSILLECTOMY  age 21    Current Outpatient Rx  . Order #: [id number] Class: Historical Med  . Order #: [id number] Class: Normal  . Order #: [id number] Class: Normal  . Order #: [id number] Class: Normal  . Order #: [id number] Class: Print  . [START ON 03/14/2018] Order #: [id number] Class: Print  .  Order #: [id number] Class: Print    Allergies Bromocriptine mesylate; Depo-medrol [methylprednisolone acetate]; Dilaudid [hydromorphone hcl]; and Imitrex [sumatriptan]  Family History  Problem Relation Age of Onset  . Heart disease Father        stent  . Migraines Father   . Migraines Son   . Diabetes Maternal Grandfather   . Colon cancer Neg Hx     Social  History Social History   Tobacco Use  . Smoking status: Never Smoker  . Smokeless tobacco: Never Used  Substance Use Topics  . Alcohol use: No    Alcohol/week: 0.0 oz    Comment: never drank regularly or heavily but NO etoh at all since 01/2017  . Drug use: No    Review of Systems  All other systems negative except as documented in the HPI. All pertinent positives and negatives as reviewed in the HPI. ____________________________________________   PHYSICAL EXAM:  VITAL SIGNS: ED Triage Vitals [03/11/18 1837]  Enc Vitals Group     BP (!) 146/101     Pulse Rate 93     Resp 16     Temp 98.9 F (37.2 C)     Temp Source Oral     SpO2 99 %     Weight 216 lb (98 kg)     Height 5\' 4"  (1.626 m)     Head Circumference      Peak Flow      Pain Score 0     Pain Loc      Pain Edu?      Excl. in [place]?     Constitutional: Alert and oriented. Well appearing and in no acute distress. Eyes: Conjunctivae are normal. PERRL. EOMI. Head: Atraumatic. Nose: No congestion/rhinnorhea. Mouth/Throat: Mucous membranes are moist.  Oropharynx non-erythematous. Neck: No stridor.  No meningeal signs.   Cardiovascular: Normal rate, regular rhythm. Good peripheral circulation. Grossly normal heart sounds.   Respiratory: Normal respiratory effort.  No retractions. Lungs CTAB. Gastrointestinal: Soft and nontender. No distention.  Musculoskeletal: No lower extremity tenderness nor edema. No gross deformities of extremities. Neurologic:  Normal speech and language. No gross focal neurologic deficits are appreciated.  Skin:  Skin is warm, dry and intact. No rash noted.  ____________________________________________   LABS (all labs ordered are listed, but only abnormal results are displayed)  Labs Reviewed  CBC WITH DIFFERENTIAL/PLATELET - Abnormal; Notable for the following components:      Result Value   RBC 5.12 (*)    MCV 77.1 (*)    MCH 25.2 (*)    All other components within normal limits   BASIC METABOLIC PANEL - Abnormal; Notable for the following components:   Sodium 134 (*)    Chloride 98 (*)    Glucose, Bld 447 (*)    All other components within normal limits  URINALYSIS, ROUTINE W REFLEX MICROSCOPIC - Abnormal; Notable for the following components:   APPearance HAZY (*)    Specific Gravity, Urine 1.041 (*)    Glucose, UA >=500 (*)    Hgb urine dipstick SMALL (*)    Ketones, ur 5 (*)    Leukocytes, UA MODERATE (*)    Bacteria, UA RARE (*)    All other components within normal limits  CBG MONITORING, ED - Abnormal; Notable for the following components:   Glucose-Capillary 482 (*)    All other components within normal limits  CBG MONITORING, ED - Abnormal; Notable for the following components:   Glucose-Capillary 335 (*)    All other  components within normal limits  URINE CULTURE    ____________________________________________  RADIOLOGY  No results found.  ____________________________________________   PROCEDURES  Procedure(s) performed:   Procedures   ____________________________________________   INITIAL IMPRESSION / ASSESSMENT AND PLAN / ED COURSE  Symptoms likely related to hyperglycemia. Started metformin, also fluconazole for yeast infection. Doubt stroke/MS or other causes. Will fu w/ pcp for same.    Pertinent labs & imaging results that were available during my care of the patient were reviewed by me and considered in my medical decision making (see chart for details).  ____________________________________________  FINAL CLINICAL IMPRESSION(S) / ED DIAGNOSES  Final diagnoses:  Yeast infection  Hyperglycemia     MEDICATIONS GIVEN DURING THIS VISIT:  Medications  sodium chloride 0.9 % bolus 1,000 mL (0 mLs Intravenous Stopped 03/11/18 2037)  lactated ringers bolus 1,000 mL (0 mLs Intravenous Stopped 03/11/18 2154)  cefTRIAXone (ROCEPHIN) 1 g in sodium chloride 0.9 % 100 mL IVPB (0 g Intravenous Stopped 03/11/18 2120)   fluconazole (DIFLUCAN) tablet 150 mg (150 mg Oral Given 03/11/18 2048)  metFORMIN (GLUCOPHAGE) tablet 500 mg (500 mg Oral Given 03/11/18 2153)     NEW OUTPATIENT MEDICATIONS STARTED DURING THIS VISIT:  Discharge Medication List as of 03/11/2018  9:20 PM    START taking these medications   Details  cephALEXin (KEFLEX) 500 MG capsule Take 1 capsule (500 mg total) by mouth 4 (four) times daily., Starting Thu 03/11/2018, Print    fluconazole (DIFLUCAN) 150 MG tablet Take 1 tablet (150 mg total) by mouth once as needed for up to 1 dose (if persistent yeast infection symptoms)., Starting Sun 03/14/2018, Print    metFORMIN (GLUCOPHAGE) 500 MG tablet Take 1 tablet (500 mg total) by mouth 2 (two) times daily with a meal., Starting Thu 03/11/2018, Print        Note:  This note was prepared with assistance of Dragon voice recognition software. Occasional wrong-word or sound-a-like substitutions may have occurred due to the inherent limitations of voice recognition software.   Merrily Pew, MD 03/12/18 [phone]

## 2018-03-11 NOTE — ED Triage Notes (Signed)
Pt states started having blurred vision Saturday. Went to eye dr yesterday and thinks it may be high blood sugar. pcp sent pt to get blood work today. urinating a lot.  Excessive thirst

## 2018-03-12 ENCOUNTER — Telehealth: Payer: Self-pay | Admitting: Family Medicine

## 2018-03-12 LAB — BASIC METABOLIC PANEL WITH GFR
BUN: 9 mg/dL (ref 7–25)
CO2: 28 mmol/L (ref 20–32)
Calcium: 9.7 mg/dL (ref 8.6–10.2)
Chloride: 99 mmol/L (ref 98–110)
Creat: 0.68 mg/dL (ref 0.50–1.10)
GFR, Est African American: 129 mL/min/{1.73_m2} (ref >=60)
GFR, Est Non African American: 111 mL/min/{1.73_m2} (ref >=60)
Glucose, Bld: 425 mg/dL — ABNORMAL HIGH (ref 65–139)
Potassium: 3.7 mmol/L (ref 3.5–5.3)
Sodium: 136 mmol/L (ref 135–146)

## 2018-03-12 LAB — HEMOGLOBIN A1C
Hgb A1c MFr Bld: 10.1 % of total Hgb — ABNORMAL HIGH (ref <5.7)
Mean Plasma Glucose: 243 (calc)
eAG (mmol/L): 13.5 (calc)

## 2018-03-12 MED FILL — GABAPENTIN 300 MG CAPSULE: 300 | 30 days supply | Qty: 30 | Fill #3

## 2018-03-12 NOTE — Telephone Encounter (Signed)
Pt has appt Tuesday.  

## 2018-03-12 NOTE — Telephone Encounter (Signed)
Received a call from [place] laboratory stating that patient had a stat blood sugar which was drawn yesterday at 10:29 AM.  However, upon review of the chart it appears that patient went to the emergency department yesterday evening and was evaluated.  She was noted at that time to have high blood sugars and was actually evaluated and treated in the emergency department.  She was started on metformin and was told to follow-up with her primary care physician.  Will send message to front desk and to Dr. Moshe Cipro to get patient seen in our office next week.

## 2018-03-13 LAB — URINE CULTURE

## 2018-03-14 ENCOUNTER — Encounter: Payer: Self-pay | Admitting: Family Medicine

## 2018-03-15 ENCOUNTER — Telehealth: Payer: Self-pay | Admitting: Family Medicine

## 2018-03-15 NOTE — Telephone Encounter (Signed)
Discussed the fact that she needs to increase her metformin to 3 times daily and will need to see educator and to test once or twice daily initially, she wioll need to be taught this

## 2018-03-16 NOTE — Telephone Encounter (Signed)
Has appt in 1 day and I hav spoken directly to pt

## 2018-03-17 ENCOUNTER — Ambulatory Visit: Payer: 59 | Admitting: Family Medicine

## 2018-03-17 ENCOUNTER — Encounter: Payer: Self-pay | Admitting: Family Medicine

## 2018-03-17 VITALS — BP 140/90 | Resp 16 | Ht 64.0 in | Wt 204.0 lb

## 2018-03-17 DIAGNOSIS — E1169 Type 2 diabetes mellitus with other specified complication: Secondary | ICD-10-CM

## 2018-03-17 DIAGNOSIS — I10 Essential (primary) hypertension: Secondary | ICD-10-CM | POA: Diagnosis not present

## 2018-03-17 LAB — GLUCOSE, POCT (MANUAL RESULT ENTRY): POC Glucose: 226 mg/dl — AB (ref 70–99)

## 2018-03-17 MED ORDER — METFORMIN HCL 1000 MG PO TABS: 1000.0000 mg | ORAL_TABLET | Freq: Two times a day (BID) | ORAL | 3 refills | Status: DC

## 2018-03-17 MED ORDER — GLIPIZIDE ER 5 MG PO TB24: 5.0000 mg | ORAL_TABLET | Freq: Every day | ORAL | 3 refills | Status: DC

## 2018-03-17 MED FILL — glipiZIDE XL 5 MG TB24: 5 | 30 days supply | Qty: 30 | Fill #0

## 2018-03-17 MED FILL — metFORMIN HCL 1000 MG TABS: 1000 | 30 days supply | Qty: 60 | Fill #0

## 2018-03-17 NOTE — Patient Instructions (Signed)
Physical as before with log  Need to test twice daily , before breakfast  And  At bedtime and record, send in any question  Goal for fasting blood sugar ranges from 80 to 120 and 2 hours after any meal or at bedtime should be between 130 to 170.    You need to see educator as soon as possible  Please commit to exerciuse at least 30 minutes every day, 3 10 min sesssions after each meal is great  Eating on a regular schedule is very important  Commitment to water as the only drnk and aim for 60  Ounces daily  Thank you  for choosing [facility]. We consider it a privelige to serve you.  Delivering excellent health care in a caring and  compassionate way is our goal.  Partnering with you,  so that together we can achieve this goal is our strategy.

## 2018-03-18 MED ORDER — BLOOD GLUCOSE METER KIT
PACK | 0 refills | Status: DC
Start: 2018-03-18 — End: 2018-05-03

## 2018-03-20 ENCOUNTER — Encounter: Payer: Self-pay | Admitting: Family Medicine

## 2018-03-20 DIAGNOSIS — E119 Type 2 diabetes mellitus without complications: Secondary | ICD-10-CM | POA: Insufficient documentation

## 2018-03-20 DIAGNOSIS — E1169 Type 2 diabetes mellitus with other specified complication: Secondary | ICD-10-CM | POA: Insufficient documentation

## 2018-03-20 DIAGNOSIS — E1159 Type 2 diabetes mellitus with other circulatory complications: Secondary | ICD-10-CM | POA: Insufficient documentation

## 2018-03-20 NOTE — Assessment & Plan Note (Signed)
Uncontrolled, not at goal, will review at next visit and adjust medication as needed DASH diet and commitment to daily physical activity for a minimum of 30 minutes discussed and encouraged, as a part of hypertension management. The importance of attaining a healthy weight is also discussed.  BP/Weight 03/17/2018 03/11/2018 02/22/2018 11/26/2017 10/27/2017 08/25/2017 32/03/7123  Systolic BP 580 998 338 250 539 767 341  Diastolic BP 90 937 902 409 92 107 84  Wt. (Lbs) 204 216 216.4 206.5 204 193.4 -  BMI 35.02 37.08 37.14 35.45 35.02 33.2 -

## 2018-03-20 NOTE — Assessment & Plan Note (Signed)
  Patient re-educated about  the importance of commitment to a  minimum of 150 minutes of exercise per week.  The importance of healthy food choices with portion control discussed. Encouraged to start a food diary, count calories and to consider  joining a support group. Sample diet sheets given re diabetic diet and she is referred to diabetic educator Goals set by the patient for the next several months.   Weight /BMI 03/17/2018 03/11/2018 02/22/2018  WEIGHT 204 lb 216 lb 216 lb 6.4 oz  HEIGHT 5\' 4"  5\' 4"  5\' 4"   BMI 35.02 kg/m2 37.08 kg/m2 37.14 kg/m2

## 2018-03-20 NOTE — Assessment & Plan Note (Signed)
Kaylee Montgomery is reminded of the importance of commitment to daily physical activity for 30 minutes or more, as able and the need to limit carbohydrate intake to 30 to 60 grams per meal to help with blood sugar control.   The need to take medication as prescribed, test blood sugar as directed, and to call between visits if there is a concern that blood sugar is uncontrolled is also discussed.   Kaylee Montgomery is reminded of the importance of daily foot exam, annual eye examination, and good blood sugar, blood pressure and cholesterol control.  Diabetic Labs Latest Ref Rng & Units 03/11/2018 03/11/2018 06/27/2017 06/26/2017 06/25/2017  HbA1c <5.7 % of total Hgb 10.1(H) - - - -  Chol 0 - 200 mg/dL - - - - -  HDL >40 mg/dL - - - - -  Calc LDL 0 - 99 mg/dL - - - - -  Triglycerides <150 mg/dL - - - - -  Creatinine 0.44 - 1.00 mg/dL 0.67 0.68 0.45 0.46 0.40(L)   BP/Weight 03/17/2018 03/11/2018 02/22/2018 11/26/2017 10/27/2017 08/25/2017 62/05/3150  Systolic BP - 761 607 371 062 694 854  Diastolic BP - 627 035 009 92 107 84  Wt. (Lbs) 204 216 216.4 206.5 204 193.4 -  BMI 35.02 37.08 37.14 35.45 35.02 33.2 -   Foot/eye exam completion dates 03/17/2018  Foot Form Completion Done   Take metformin 1000 mg twice daily and add glipizide ER 5 mg one daily, test twice daily and record, send results weekly. Educator asap

## 2018-03-20 NOTE — Progress Notes (Signed)
Kaylee Montgomery     MRN: [id number]      DOB: 01/04/1981   HPI Kaylee Montgomery is here because of a new diagnosis of diabetes. 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 Denies sinus pressure, nasal congestion, ear pain or sore throat. Denies chest congestion, productive cough or wheezing. Denies chest pains, palpitations and leg swelling Denies abdominal pain, nausea, vomiting,diarrhea or constipation.   Denies dysuria, frequency, hesitancy or incontinence. Denies joint pain, swelling and limitation in mobility. Denies headaches, seizures, numbness, or tingling. Denies depression, c/o anxiety denies insomnia. Denies skin break down or rash.   PE  BP 140/90   Resp 16   Ht 5\' 4"  (1.626 m)   Wt 204 lb (92.5 kg)   SpO2 99%   BMI 35.02 kg/m   Patient alert and oriented and in no cardiopulmonary distress.  HEENT: No facial asymmetry, EOMI,   oropharynx pink and moist.  Neck supple no JVD, no mass.  Chest: Clear to auscultation bilaterally.  CVS: S1, S2 no murmurs, no S3.Regular rate.  ABD: Soft non tender.   Ext: No edema  MS: Adequate ROM spine, shoulders, hips and knees.  Skin: Intact, no ulcerations or rash noted.  Psych: Good eye contact, normal affect. Memory intact not anxious or depressed appearing.  CNS: CN 2-12 intact, power,  normal throughout.no focal deficits noted.   Assessment & Plan Type 2 diabetes mellitus with other specified complication [facility]) Kaylee Montgomery is reminded of the importance of commitment to daily physical  activity for 30 minutes or more, as able and the need to limit carbohydrate intake to 30 to 60 grams per meal to help with blood sugar control.   The need to take medication as prescribed, test blood sugar as directed, and to call between visits if there is a concern that blood sugar is uncontrolled is also discussed.   Kaylee Montgomery is reminded of the importance of daily foot exam, annual eye examination, and good blood sugar, blood pressure and cholesterol control.  Diabetic Labs Latest Ref Rng & Units 03/11/2018 03/11/2018 06/27/2017 06/26/2017 06/25/2017  HbA1c <5.7 % of total Hgb 10.1(H) - - - -  Chol 0 - 200 mg/dL - - - - -  HDL >40 mg/dL - - - - -  Calc LDL 0 - 99 mg/dL - - - - -  Triglycerides <150 mg/dL - - - - -  Creatinine 0.44 - 1.00 mg/dL 0.67 0.68 0.45 0.46 0.40(L)   BP/Weight 03/17/2018 03/11/2018 02/22/2018 11/26/2017 10/27/2017 08/25/2017 07/22/5851  Systolic BP - 778 242 353 614 431 540  Diastolic BP - 086 761 950 92 107 84  Wt. (Lbs) 204 216 216.4 206.5 204 193.4 -  BMI 35.02 37.08 37.14 35.45 35.02 33.2 -   Foot/eye exam completion dates 03/17/2018  Foot Form Completion Done   Take metformin 1000 mg twice daily and add glipizide ER 5 mg one daily, test twice daily and record, send results weekly. Educator asap     Essential hypertension Uncontrolled, not at goal, will  review at next visit and adjust medication as needed DASH diet and commitment to daily physical activity for a minimum of 30 minutes discussed and encouraged, as a part of hypertension management. The importance of attaining a healthy weight is also discussed.  BP/Weight 03/17/2018 03/11/2018 02/22/2018 11/26/2017 10/27/2017 08/25/2017 81/05/4036  Systolic BP 543 606 770 340 352 481 859  Diastolic BP 90 093 112 162 92 107 84  Wt. (Lbs) 204 216 216.4 206.5 204 193.4 -  BMI 35.02 37.08 37.14 35.45 35.02 33.2 -       Morbid obesity (HCC)  Patient re-educated about  the importance of commitment to a  minimum of 150 minutes  of exercise per week.  The importance of healthy food choices with portion control discussed. Encouraged to start a food diary, count calories and to consider  joining a support group. Sample diet sheets given re diabetic diet and she is referred to diabetic educator Goals set by the patient for the next several months.   Weight /BMI 03/17/2018 03/11/2018 02/22/2018  WEIGHT 204 lb 216 lb 216 lb 6.4 oz  HEIGHT 5\' 4"  5\' 4"  5\' 4"   BMI 35.02 kg/m2 37.08 kg/m2 37.14 kg/m2

## 2018-03-22 ENCOUNTER — Encounter: Payer: Self-pay | Admitting: Family Medicine

## 2018-03-22 MED FILL — CONTOUR NEXT STRIPS: 31 days supply | Qty: 50 | Fill #0

## 2018-03-22 MED FILL — CONTOUR NEXT METER: W/DEVICE | 30 days supply | Qty: 1 | Fill #0

## 2018-03-22 MED FILL — MICROLET LANCETS MISC: 30 days supply | Qty: 100 | Fill #0

## 2018-03-23 ENCOUNTER — Encounter: Payer: Self-pay | Admitting: Nutrition

## 2018-03-23 ENCOUNTER — Encounter: Payer: 59 | Attending: Family Medicine | Admitting: Nutrition

## 2018-03-23 DIAGNOSIS — Z6835 Body mass index (BMI) 35.0-35.9, adult: Secondary | ICD-10-CM | POA: Insufficient documentation

## 2018-03-23 DIAGNOSIS — Z713 Dietary counseling and surveillance: Secondary | ICD-10-CM | POA: Diagnosis not present

## 2018-03-23 DIAGNOSIS — E1169 Type 2 diabetes mellitus with other specified complication: Secondary | ICD-10-CM | POA: Diagnosis not present

## 2018-03-23 NOTE — Patient Instructions (Signed)
Goals .1. Follow My Plate 2. Eat meals on t ime 3. Eat 2-3 carb choices per meal 4. Cut out sodas and drink lemon water 5. Walk 30 minutes a day 6. Take meds as prescribed. No snacks between meals.

## 2018-03-23 NOTE — Progress Notes (Signed)
Diabetes Self-Management Education  Visit Type: First/Initial  Appt. Start Time: 0915 Appt. End Time: 1100  03/23/2018  [name], identified by name and date of birth, is a 37 y.o. female with a diagnosis of Diabetes: Type 2.   ASSESSMENT  Height 5\' 4"  (1.626 m), weight 207 lb (93.9 kg). Body mass index is 35.53 kg/m. Gallbadder removed Sep 11, 2017 In hospital pancreatitis 2018 June for 17 days. Sept 2018 pancreatis for 5 days. Dx DM May 2019. Lives by herself and her 76 yr old son. She doesn't like to test her blood sugars, so her son does it for her. Diabetes Self-Management Education - 03/23/18 0927      Visit Information   Visit Type  First/Initial      Initial Visit   Diabetes Type  Type 2    Are you currently following a meal plan?  No    Are you taking your medications as prescribed?  Yes    Date Diagnosed  2018      Health Coping   How would you rate your overall health?  Fair      Psychosocial Assessment   Patient Belief/Attitude about Diabetes  Motivated to manage diabetes    Self-care barriers  None    Self-management support  Doctor's office;Family    Other persons present  Patient    Patient Concerns  Nutrition/Meal planning;Problem Solving;Glycemic Control;Weight Control;Medication    Preferred Learning Style  No preference indicated    Learning Readiness  Change in progress    How often do you need to have someone help you when you read instructions, pamphlets, or other written materials from your doctor or pharmacy?  1 - Never    What is the last grade level you completed in school?  18      Pre-Education Assessment   Patient understands the diabetes disease and treatment process.  Needs Instruction    Patient understands incorporating nutritional management into lifestyle.  Needs Instruction    Patient undertands incorporating physical activity into lifestyle.  Needs Instruction    Patient understands using medications safely.  Needs  Instruction    Patient understands monitoring blood glucose, interpreting and using results  Needs Instruction    Patient understands prevention, detection, and treatment of acute complications.  Needs Instruction    Patient understands prevention, detection, and treatment of chronic complications.  Needs Instruction    Patient understands how to develop strategies to address psychosocial issues.  Needs Instruction    Patient understands how to develop strategies to promote health/change behavior.  Needs Instruction      Complications   Last HgB A1C per patient/outside source  10 %    How often do you check your blood sugar?  1-2 times/day    Fasting Blood glucose range (mg/dL)  70-129    Postprandial Blood glucose range (mg/dL)  130-179    Number of hypoglycemic episodes per month  0    Number of hyperglycemic episodes per week  2    Can you tell when your blood sugar is high?  Yes    What do you do if your blood sugar is high?  water    Have you had a dilated eye exam in the past 12 months?  Yes    Have you had a dental exam in the past 12 months?  Yes    Are you checking your feet?  No      Dietary Intake   Breakfast  prevuious; nothing:  Now: yogurt with fruit or oatmeal with fruit, and egg    Lunch  Crab rolls 4 water    Dinner  Toss salad, broccoli/cheese, green beans, meatloaf, bourbon chicken, water    Snack (evening)  5 grapes    Beverage(s)  water      Exercise   Exercise Type  Light (walking / raking leaves)    How many days per week to you exercise?  5    How many minutes per day do you exercise?  15    Total minutes per week of exercise  75      Patient Education   Previous Diabetes Education  No    Disease state   Definition of diabetes, type 1 and 2, and the diagnosis of diabetes;Factors that contribute to the development of diabetes;Explored patient's options for treatment of their diabetes    Nutrition management   Role of diet in the treatment of diabetes and the  relationship between the three main macronutrients and blood glucose level;Food label reading, portion sizes and measuring food.;Carbohydrate counting;Reviewed blood glucose goals for pre and post meals and how to evaluate the patients' food intake on their blood glucose level.;Meal timing in regards to the patients' current diabetes medication.    Physical activity and exercise   Role of exercise on diabetes management, blood pressure control and cardiac health.    Medications  Reviewed patients medication for diabetes, action, purpose, timing of dose and side effects.    Monitoring  Purpose and frequency of SMBG.;Taught/discussed recording of test results and interpretation of SMBG.;Identified appropriate SMBG and/or A1C goals.;Daily foot exams    Acute complications  Taught treatment of hypoglycemia - the 15 rule.;Discussed and identified patients' treatment of hyperglycemia.    Chronic complications  Relationship between chronic complications and blood glucose control    Personal strategies to promote health  Lifestyle issues that need to be addressed for better diabetes care      Individualized Goals (developed by patient)   Nutrition  General guidelines for healthy choices and portions discussed;Adjust meds/carbs with exercise as discussed;Follow meal plan discussed    Physical Activity  Exercise 3-5 times per week;30 minutes per day    Medications  take my medication as prescribed    Monitoring   test my blood glucose as discussed      Post-Education Assessment   Patient understands the diabetes disease and treatment process.  Needs Review    Patient understands incorporating nutritional management into lifestyle.  Needs Review    Patient undertands incorporating physical activity into lifestyle.  Needs Review    Patient understands using medications safely.  Needs Review    Patient understands monitoring blood glucose, interpreting and using results  Needs Review    Patient understands  prevention, detection, and treatment of acute complications.  Needs Review    Patient understands prevention, detection, and treatment of chronic complications.  Needs Review    Patient understands how to develop strategies to address psychosocial issues.  Needs Review    Patient understands how to develop strategies to promote health/change behavior.  Needs Review      Outcomes   Expected Outcomes  Demonstrated interest in learning. Expect positive outcomes    Future DMSE  4-6 wks    Program Status  Completed       Individualized Plan for Diabetes Self-Management Training:   Learning Objective:  Patient will have a greater understanding of diabetes self-management. Patient education plan is to attend individual and/or group  sessions per assessed needs and concerns.   Plan:  Goals .1. Follow My Plate 2. Eat meals on t ime 3. Eat 2-3 carb choices per meal 4. Cut out sodas and drink lemon water 5. Walk 30 minutes a day 6. Take meds as prescribed. No snacks between meals.   Expected Outcomes:  Demonstrated interest in learning. Expect positive outcomes  Education material provided: ADA Diabetes: Your Take Control Guide, Food label handouts, A1C conversion sheet, Meal plan card, My Plate and Carbohydrate counting sheet  If problems or questions, patient to contact team via:  Phone and Email  Future DSME appointment: 4-6 wks

## 2018-03-31 ENCOUNTER — Encounter: Payer: Self-pay | Admitting: Family Medicine

## 2018-03-31 ENCOUNTER — Ambulatory Visit (INDEPENDENT_AMBULATORY_CARE_PROVIDER_SITE_OTHER): Payer: 59 | Admitting: Family Medicine

## 2018-03-31 VITALS — BP 132/90 | HR 78 | Temp 98.6°F | Ht 64.0 in | Wt 209.1 lb

## 2018-03-31 DIAGNOSIS — E1169 Type 2 diabetes mellitus with other specified complication: Secondary | ICD-10-CM

## 2018-03-31 DIAGNOSIS — D353 Benign neoplasm of craniopharyngeal duct: Secondary | ICD-10-CM | POA: Diagnosis not present

## 2018-03-31 DIAGNOSIS — I10 Essential (primary) hypertension: Secondary | ICD-10-CM | POA: Diagnosis not present

## 2018-03-31 DIAGNOSIS — D352 Benign neoplasm of pituitary gland: Secondary | ICD-10-CM

## 2018-03-31 DIAGNOSIS — Z0000 Encounter for general adult medical examination without abnormal findings: Secondary | ICD-10-CM

## 2018-03-31 NOTE — Assessment & Plan Note (Signed)
Uncontrolled , new diagnosis in 02/2018.Much improved [name] is reminded of the importance of commitment to daily physical activity for 30 minutes or more, as able and the need to limit carbohydrate intake to 30 to 60 grams per meal to help with blood sugar control.   The need to take medication as prescribed, test blood sugar as directed, and to call between visits if there is a concern that blood sugar is uncontrolled is also discussed.   Kaylee Montgomery is reminded of the importance of daily foot exam, annual eye examination, and good blood sugar, blood pressure and cholesterol control.  Diabetic Labs Latest Ref Rng & Units 03/11/2018 03/11/2018 06/27/2017 06/26/2017 06/25/2017  HbA1c <5.7 % of total Hgb 10.1(H) - - - -  Chol 0 - 200 mg/dL - - - - -  HDL >40 mg/dL - - - - -  Calc LDL 0 - 99 mg/dL - - - - -  Triglycerides <150 mg/dL - - - - -  Creatinine 0.44 - 1.00 mg/dL 0.67 0.68 0.45 0.46 0.40(L)   BP/Weight 03/31/2018 03/23/2018 03/17/2018 03/11/2018 02/22/2018 7/0/0174 06/23/4966  Systolic BP 591 - 638 466 599 357 017  Diastolic BP 90 - 90 793 903 100 92  Wt. (Lbs) 209.12 207 204 216 216.4 206.5 204  BMI 35.9 35.53 35.02 37.08 37.14 35.45 35.02   Foot/eye exam completion dates 03/17/2018  Foot Form Completion Done   microalb today, for pneumonia vaccine at next visit

## 2018-03-31 NOTE — Patient Instructions (Addendum)
F/U August 27 or after, call if you need me sooner  Microalb from office today  Will hold on pneumonia vaccine until your next visit  Continue to be diligent with food choice and carb counting. You will get paper with carb count in fruit  Please commit to 30 minutes of exercise daily  Youn are doing well with blood sugar test and log.  Please send in numbers to me if you have concerns and keepappt with dietitian  Fasting lipid, cmp and eGFR and hBA1C and tSH and vit d August 23 or shortly after  Thank you  for choosing [facility]. We consider it a privelige to serve you.  Delivering excellent health care in a caring and  compassionate way is our goal.  Partnering with you,  so that together we can achieve this goal is our strategy.

## 2018-03-31 NOTE — Progress Notes (Signed)
Kaylee Montgomery     MRN: [id number]      DOB: [birth year]  HPI: Patient is in for annual physical exam. Blood sugars are reviewed, fasting blood sugar is often under 130, she has had no hypoglycemic episodes and is tolerating medication well Recent labs,  are reviewed. Immunization is reviewed ,wil administer pneumonia vaccine at next visit.   PE: BP 132/90 (BP Location: Left Arm, Patient Position: Sitting, Cuff Size: Large)   Pulse 78   Temp 98.6 F (37 C)   Ht 5\' 4"  (1.626 m)   Wt 209 lb 1.9 oz (94.9 kg)   SpO2 99%   BMI 35.90 kg/m   Pleasant  female, alert and oriented x 3, in no cardio-pulmonary distress. Afebrile. HEENT No facial trauma or asymetry. Sinuses non tender.  Extra occullar muscles intact, External ears normal, tympanic membranes clear. Oropharynx moist, no exudate. Neck: supple, no adenopathy,JVD or thyromegaly.No bruits.  Chest: Clear to ascultation bilaterally.No crackles or wheezes. Non tender to palpation  Breast: No asymetry,no masses or lumps. No tenderness. No nipple discharge or inversion. No axillary or supraclavicular adenopathy  Cardiovascular system; Heart sounds normal,  S1 and  S2 ,no S3.  No murmur, or thrill. Apical beat not displaced Peripheral pulses normal.  Abdomen: Soft, non tender, no organomegaly or masses. No bruits. Bowel sounds normal. No guarding, tenderness or rebound.  Rectal:  Not examined not indicatedl.  GU: Not examined, no complaints  Musculoskeletal exam: Full ROM of spine, hips , shoulders and knees. No deformity ,swelling or crepitus noted. No muscle wasting or atrophy.   Neurologic: Cranial nerves 2 to 12 intact. Power, tone ,sensation and reflexes normal throughout. No disturbance in gait. No tremor.  Skin: Intact, no ulceration, erythema , scaling or rash noted. Pigmentation normal throughout  Psych; Normal mood and affect. Judgement and concentration normal   Assessment & Plan:    Annual physical exam Annual exam as documented. Counseling done  re healthy lifestyle involving commitment to 150 minutes exercise per week, heart healthy diet, and attaining healthy weight.The importance of adequate sleep also discussed.  Changes in health habits are decided on by the patient with goals and time frames  set for achieving them. Immunization and cancer screening needs are specifically addressed at this visit.   Type 2 diabetes mellitus with other specified complication ([place]) Uncontrolled , new diagnosis in 02/2018.Much improved Kaylee Montgomery is reminded of the importance of commitment to daily physical activity for 30 minutes or more, as able and the need to limit carbohydrate intake to 30 to 60 grams per meal to help with blood sugar control.   The need to take medication as prescribed, test blood sugar as directed, and to call between visits if there is a concern that blood sugar is uncontrolled is also discussed.   Kaylee Montgomery is reminded of the importance of daily foot exam, annual eye examination, and good blood sugar, blood pressure and cholesterol control.  Diabetic Labs Latest Ref Rng & Units 03/11/2018 03/11/2018 06/27/2017 06/26/2017 06/25/2017  HbA1c <5.7 % of total Hgb 10.1(H) - - - -  Chol 0 - 200 mg/dL - - - - -  HDL >40 mg/dL - - - - -  Calc LDL 0 - 99 mg/dL - - - - -  Triglycerides <150 mg/dL - - - - -  Creatinine 0.44 - 1.00 mg/dL 0.67 0.68 0.45 0.46 0.40(L)   BP/Weight 03/31/2018 03/23/2018 03/17/2018 03/11/2018 02/22/2018 11/27/7865 03/26/2093  Systolic BP 709 - 628  157 786 754 492  Diastolic BP 90 - 90 010 071 100 92  Wt. (Lbs) 209.12 207 204 216 216.4 206.5 204  BMI 35.9 35.53 35.02 37.08 37.14 35.45 35.02   Foot/eye exam completion dates 03/17/2018  Foot Form Completion Done   microalb today, for pneumonia vaccine at next visit    Essential hypertension Uncontrolled, not at goal No med change DASH diet and commitment to daily physical activity for a minimum  of 30 minutes discussed and encouraged, as a part of hypertension management. The importance of attaining a healthy weight is also discussed.  BP/Weight 03/31/2018 03/23/2018 03/17/2018 03/11/2018 02/22/2018 11/20/9756 05/22/2548  Systolic BP 826 - 415 830 940 768 088  Diastolic BP 90 - 90 110 315 100 92  Wt. (Lbs) 209.12 207 204 216 216.4 206.5 204  BMI 35.9 35.53 35.02 37.08 37.14 35.45 35.02       Morbid obesity (HCC)  Patient re-educated about  the importance of commitment to a  minimum of 150 minutes of exercise per week.  The importance of healthy food choices with portion control discussed. Encouraged to start a food diary, count calories and to consider  joining a support group. Sample diet sheets offered. Goals set by the patient for the next several months.   Weight /BMI 03/31/2018 03/23/2018 03/17/2018  WEIGHT 209 lb 1.9 oz 207 lb 204 lb  HEIGHT 5\' 4"  5\' 4"  5\' 4"   BMI 35.9 kg/m2 35.53 kg/m2 35.02 kg/m2

## 2018-03-31 NOTE — Assessment & Plan Note (Signed)
Uncontrolled, not at goal No med change DASH diet and commitment to daily physical activity for a minimum of 30 minutes discussed and encouraged, as a part of hypertension management. The importance of attaining a healthy weight is also discussed.  BP/Weight 03/31/2018 03/23/2018 03/17/2018 03/11/2018 02/22/2018 05/21/5188 05/23/2102  Systolic BP 128 - 118 867 737 366 815  Diastolic BP 90 - 90 947 076 100 92  Wt. (Lbs) 209.12 207 204 216 216.4 206.5 204  BMI 35.9 35.53 35.02 37.08 37.14 35.45 35.02

## 2018-03-31 NOTE — Assessment & Plan Note (Signed)
Annual exam as documented. Counseling done  re healthy lifestyle involving commitment to 150 minutes exercise per week, heart healthy diet, and attaining healthy weight.The importance of adequate sleep also discussed. Changes in health habits are decided on by the patient with goals and time frames  set for achieving them. Immunization and cancer screening needs are specifically addressed at this visit. 

## 2018-03-31 NOTE — Assessment & Plan Note (Addendum)
  Patient re-educated about  the importance of commitment to a  minimum of 150 minutes of exercise per week.  The importance of healthy food choices with portion control discussed. Encouraged to start a food diary, count calories and to consider  joining a support group. Sample diet sheets offered. Goals set by the patient for the next several months.   Weight /BMI 03/31/2018 03/23/2018 03/17/2018  WEIGHT 209 lb 1.9 oz 207 lb 204 lb  HEIGHT 5\' 4"  5\' 4"  5\' 4"   BMI 35.9 kg/m2 35.53 kg/m2 35.02 kg/m2

## 2018-04-01 ENCOUNTER — Other Ambulatory Visit (HOSPITAL_COMMUNITY)
Admission: AD | Admit: 2018-04-01 | Discharge: 2018-04-01 | Disposition: A | Discharge status: Home / Self Care | Payer: 59 | Source: Other Acute Inpatient Hospital | Attending: Family Medicine | Admitting: Family Medicine

## 2018-04-01 DIAGNOSIS — E1169 Type 2 diabetes mellitus with other specified complication: Secondary | ICD-10-CM | POA: Diagnosis present

## 2018-04-01 MED FILL — CYCLOBENZAPRINE 5 MG TABLET: 5 | 30 days supply | Qty: 30 | Fill #4

## 2018-04-02 LAB — MICROALBUMIN / CREATININE URINE RATIO
Creatinine, Urine: 267.5 mg/dL
Microalb Creat Ratio: 25.9 mg/g creat (ref 0.0–30.0)
Microalb, Ur: 69.4 ug/mL — ABNORMAL HIGH

## 2018-04-21 MED FILL — GABAPENTIN 300 MG CAPSULE: 300 | 30 days supply | Qty: 30 | Fill #4

## 2018-05-03 ENCOUNTER — Other Ambulatory Visit: Payer: Self-pay | Admitting: Family Medicine

## 2018-05-03 ENCOUNTER — Encounter: Payer: Self-pay | Admitting: Family Medicine

## 2018-05-03 ENCOUNTER — Other Ambulatory Visit: Payer: Self-pay

## 2018-05-03 DIAGNOSIS — E1169 Type 2 diabetes mellitus with other specified complication: Secondary | ICD-10-CM

## 2018-05-03 MED ORDER — BLOOD GLUCOSE METER KIT: PACK | 0 refills | Status: DC

## 2018-05-03 MED FILL — metFORMIN HCL 1000 MG TABS: 1000 | 30 days supply | Qty: 60 | Fill #1

## 2018-05-03 MED FILL — CYCLOBENZAPRINE 5 MG TABLET: 5 | 30 days supply | Qty: 30 | Fill #5

## 2018-05-19 ENCOUNTER — Encounter: Payer: 59 | Attending: Family Medicine | Admitting: Nutrition

## 2018-05-19 VITALS — Ht 64.0 in | Wt 206.0 lb

## 2018-05-19 DIAGNOSIS — IMO0002 Reserved for concepts with insufficient information to code with codable children: Secondary | ICD-10-CM

## 2018-05-19 DIAGNOSIS — E118 Type 2 diabetes mellitus with unspecified complications: Secondary | ICD-10-CM

## 2018-05-19 DIAGNOSIS — E669 Obesity, unspecified: Secondary | ICD-10-CM

## 2018-05-19 DIAGNOSIS — Z713 Dietary counseling and surveillance: Secondary | ICD-10-CM | POA: Insufficient documentation

## 2018-05-19 DIAGNOSIS — E119 Type 2 diabetes mellitus without complications: Secondary | ICD-10-CM | POA: Insufficient documentation

## 2018-05-19 DIAGNOSIS — E1165 Type 2 diabetes mellitus with hyperglycemia: Secondary | ICD-10-CM

## 2018-05-19 NOTE — Patient Instructions (Addendum)
Goals Congrats!! Keep up the great job!! Eat breakast 1. Cut down on processed meats 2. Try the candian bacon  3. Increase exercise to 30 minutes 3-4 times per week .Lose 1 lbs per week Get A1C down to 7%.  Try eating fruit when craving something.

## 2018-05-19 NOTE — Progress Notes (Signed)
  Diabetes Self-Management Education  Visit Type:  F/u  Appt. Start Time: 0915 Appt. End Time: 1100  05/19/2018  [name], identified by name and date of birth, is a 37 y.o. female with a diagnosis of Diabetes Type 2:  .  Changes made:  Have beeeworking on portions, cutting out sweets, less bread intake focusing more on fresh fruits and vegetables. Lost 3 lbs. Not eating breakfast often.  Having diarrhea from Metformin. So, she has stopped the morning dose of Metformin and hasn't been taking the Glipizide either.  Blood sugar control is fantastic! FBS 91-115   Bedtime 150-180's. Has anxiety over testing blood sugars. He brother tests her blood sugars for her.  Only taking Metformin 1000 mg at night.  Feels much better. More energy, no more blurry vision and no frequent urination. No new labs. Gets them done in August with Dr. Moshe Cipro.  Lab Results  Component Value Date   HGBA1C 10.1 (H) 03/11/2018  ASSESSMENT  Height 5\' 4"  (1.626 m), weight 206 lb (93.4 kg). Body mass index is 35.36 kg/m. Gallbadder removed Sep 11, 2017 In hospital pancreatitis 2018 June for 17 days. Sept 2018 pancreatis for 5 days. Dx DM May 2019. Lives by herself and her 44 yr old son. She doesn't like to test her blood sugars, so her son does it for her.   Individualized Plan for Diabetes Self-Management Training:   Learning Objective:  Patient will have a greater understanding of diabetes self-management. Patient education plan is to attend individual and/or group sessions per assessed needs and concerns.    Goals Congrats!! Keep up the great job!! Eat breakast 1. Cut down on processed meats 2. Try the candian bacon  3. Increase exercise to 30 minutes 3-4 times per week .Lose 1 lbs per week Get A1C down to 7%.  Try eating fruit when craving something.    Expected Outcomes:   Improved self management of diabetes.  Education material provided: ADA Diabetes: Your Take Control Guide, Food  label handouts, A1C conversion sheet, Meal plan card, My Plate and Carbohydrate counting sheet  If problems or questions, patient to contact team via:  Phone and Email  Future DSME appointment:   Recommend to reduce Metformin to 1000 mg once a day at night and stop Glipizide as it's not needed and she hasn't been taking it.

## 2018-05-20 ENCOUNTER — Encounter: Payer: Self-pay | Admitting: Nutrition

## 2018-05-21 MED FILL — METOPROLOL TARTRATE 50 MG T: 50 | 30 days supply | Qty: 90 | Fill #3

## 2018-05-21 MED FILL — GABAPENTIN 300 MG CAPSULE: 300 | 30 days supply | Qty: 30 | Fill #5

## 2018-05-31 ENCOUNTER — Ambulatory Visit: Payer: 59 | Admitting: Adult Health

## 2018-05-31 ENCOUNTER — Encounter: Payer: Self-pay | Admitting: Adult Health

## 2018-05-31 VITALS — BP 136/88 | HR 71 | Ht 64.0 in | Wt 207.0 lb

## 2018-05-31 DIAGNOSIS — G43009 Migraine without aura, not intractable, without status migrainosus: Secondary | ICD-10-CM

## 2018-05-31 DIAGNOSIS — G44209 Tension-type headache, unspecified, not intractable: Secondary | ICD-10-CM

## 2018-05-31 NOTE — Progress Notes (Signed)
PATIENT: Kaylee Montgomery DOB: May 14, 1981  REASON FOR VISIT: follow up HISTORY FROM: patient  HISTORY OF PRESENT ILLNESS: Today 05/31/18 Kaylee Montgomery is a 37 year old female with a history of migraine headaches and muscle tension headaches.  She returns today for follow-up.  She reports that Flexeril continues to work well for her headaches.  She states that she has not had a tension type headache in quite some time.  She reports that she has had headaches that occur in the frontal region.  She says that this may be related to her sinuses.  She reports that she had a lot of sinus pressure in the face.  She has approximately 2 headaches a week.  She reports that Tylenol is beneficial but typically takes a couple of hours.  She states that she has noticed in the winter months her headaches seem to be worse.  She continues to take Flexeril at bedtime as well as gabapentin at bedtime.  She returns today for evaluation.  HISTORY Kaylee Montgomery is a 37 year old right-handed black female with a history of migraine headaches and some muscle tension type headaches coming up from the neck.  The patient does have some neck stiffness.  She had been out of work for about 6 months with pancreatitis, her headaches during that period of time improved to only one a month.  The patient ran out of her Flexeril about 30 days ago when she began having headaches within the last 2 weeks.  The patient may get photophobia, phonophobia, nausea with the headache.  She indicates that perfume odors and cold weather may bring on her headache.  The patient returns to the office today for an evaluation.   REVIEW OF SYSTEMS: Out of a complete 14 system review of symptoms, the patient complains only of the following symptoms, and all other reviewed systems are negative.  See HPI  ALLERGIES: Allergies  Allergen Reactions  . Bromocriptine Mesylate Nausea Only  . Depo-Medrol [Methylprednisolone Acetate] Itching and Rash  .  Dilaudid [Hydromorphone Hcl] Itching  . Imitrex [Sumatriptan] Rash    HOME MEDICATIONS: Outpatient Medications Prior to Visit  Medication Sig Dispense Refill  . acetaminophen (TYLENOL) 500 MG tablet Take 1,000 mg by mouth every 6 (six) hours as needed.    . blood glucose meter kit and supplies Dispense based on patient and insurance preference. Once daily testing dx e11.9 1 each 0  . cyclobenzaprine (FLEXERIL) 5 MG tablet Take 1 tablet (5 mg total) by mouth at bedtime. 90 tablet 3  . gabapentin (NEURONTIN) 300 MG capsule Take 1 capsule (300 mg total) by mouth daily. 90 capsule 3  . Lancets MISC 1 each by Does not apply route 2 (two) times daily.    . metFORMIN (GLUCOPHAGE) 1000 MG tablet Take 1 tablet (1,000 mg total) by mouth 2 (two) times daily with a meal. 180 tablet 3  . Metoprolol Tartrate 75 MG TABS Take 75 mg by mouth 2 (two) times daily. 180 tablet 3  . glipiZIDE (GLUCOTROL XL) 5 MG 24 hr tablet Take 1 tablet (5 mg total) by mouth daily with breakfast. (Patient not taking: Reported on 05/19/2018) 90 tablet 3   No facility-administered medications prior to visit.     PAST MEDICAL HISTORY: Past Medical History:  Diagnosis Date  . Breast mass 02/2017  . Dental crown present   . Hypertension    states BP fluctuates; has been on med. x 1 yr.  . Migraines   . 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 Pituitary adenoma ([place])   . Seasonal allergies   . Sleep apnea    no CPAP use in > 6 mos.    PAST SURGICAL HISTORY: Past Surgical History:  Procedure Laterality Date  . BREAST LUMPECTOMY WITH RADIOACTIVE SEED LOCALIZATION Left 03/05/2017   Procedure: LEFT BREAST LUMPECTOMY WITH RADIOACTIVE SEED LOCALIZATION;  Surgeon: Donnie Mesa, MD;  Location: [facility];  Service: General;  Laterality: Left;  . CESAREAN SECTION    . CHOLECYSTECTOMY  08/2017   [facility]  . ESOPHAGOGASTRODUODENOSCOPY  07/21/2017   [facility]: Dr. Delrae Alfred. healthy appearing cyst  cavity without necrotic material that was decreased in size. Removal of stent scheduled for 11/12.   Marland Kitchen EUS  06/19/2017   [facility]: 7 x 7.3 cm cyst with necrotic material noted adjacent to the body of the pancreas. FNA without malignancy.  Marland Kitchen LAPAROSCOPIC GASTRIC SLEEVE RESECTION N/A 02/18/2016   Procedure: LAPAROSCOPIC GASTRIC SLEEVE RESECTION, UPPER ENDO;  Surgeon: Greer Pickerel, MD;  Location: [facility];  Service: General;  Laterality: N/A;  . 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 TONSILLECTOMY  age 66    FAMILY HISTORY: Family History  Problem Relation Age of Onset  . Heart disease Father        stent  . Migraines Father   . Migraines Son   . Diabetes Maternal Grandfather   . Colon cancer Neg Hx     SOCIAL HISTORY: Social History   Socioeconomic History  . Marital status: Single    Spouse name: Not on file  . Number of children: 1  . Years of education: Not on file  . Highest education level: Not on file  Occupational History  . Occupation: STUDENT   . Occupation: CASEWORKER    Employer: [employer]  Social Needs  . Financial resource strain: Not on file  . Food insecurity:    Worry: Not on file    Inability: Not on file  . Transportation needs:    Medical: Not on file    Non-medical: Not on file  Tobacco Use  . Smoking status: Never Smoker  . Smokeless tobacco: Never Used  Substance and Sexual Activity  . Alcohol use: No    Alcohol/week: 0.0 standard drinks    Comment: never drank regularly or heavily but NO etoh at all since 01/2017  . Drug use: No  . Sexual activity: Not on file  Lifestyle  . Physical activity:    Days per week: Not on file    Minutes per session: Not on file  . Stress: Not on file  Relationships  . Social connections:    Talks on phone: Not on file    Gets together: Not on file    Attends religious service: Not on file    Active member of club or  organization: Not on file    Attends meetings of clubs or organizations: Not on file    Relationship status: Not on file  . Intimate partner violence:    Fear of current or ex partner: Not on file    Emotionally abused: Not on file    Physically abused: Not on file    Forced sexual activity: Not on file  Other Topics Concern  . Not on file  Social History Narrative   Patient is right handed.   Patient drinks 1-2 cups of caffeine daily.      PHYSICAL EXAM  Vitals:   05/31/18 0843  BP: 136/88  Pulse: 71  Weight: 207 lb (93.9 kg)  Height: '5\' 4"'$  (1.626 m)   Body mass index is 35.53 kg/m.  Generalized: Well developed, in no acute distress   Neurological examination  Mentation: Alert oriented to time, place, history taking. Follows all commands speech and language fluent Cranial nerve II-XII: Pupils were equal round reactive to light. Extraocular movements were full, visual field were full on confrontational test. Facial sensation and strength were normal. Uvula tongue midline. Head turning and shoulder shrug  were normal and symmetric. Motor: The motor testing reveals 5 over 5 strength of all 4 extremities. Good symmetric motor tone is noted throughout.  Sensory: Sensory testing is intact to soft touch on all 4 extremities. No evidence of extinction is noted.  Coordination: Cerebellar testing reveals good finger-nose-finger and heel-to-shin bilaterally.  Gait and station: Gait is normal.  Reflexes: Deep tendon reflexes are symmetric and normal bilaterally.   DIAGNOSTIC DATA (LABS, IMAGING, TESTING) - I reviewed patient records, labs, notes, testing and imaging myself where available.  Lab Results  Component Value Date   WBC 7.0 03/11/2018   HGB 12.9 03/11/2018   HCT 39.5 03/11/2018   MCV 77.1 (L) 03/11/2018   PLT 245 03/11/2018      Component Value Date/Time   NA 134 (L) 03/11/2018 1925   K 3.6 03/11/2018 1925   CL 98 (L) 03/11/2018 1925   CO2 25 03/11/2018 1925    GLUCOSE 447 (H) 03/11/2018 1925   BUN 8 03/11/2018 1925   CREATININE 0.67 03/11/2018 1925   CREATININE 0.68 03/11/2018 1029   CALCIUM 9.5 03/11/2018 1925   PROT 6.6 06/26/2017 0551   ALBUMIN 3.8 06/26/2017 0551   AST 18 06/26/2017 0551   ALT 23 06/26/2017 0551   ALKPHOS 39 06/26/2017 0551   BILITOT 1.1 06/26/2017 0551   GFRNONAA >60 03/11/2018 1925   GFRNONAA 111 03/11/2018 1029   GFRAA >60 03/11/2018 1925   GFRAA 129 03/11/2018 1029   Lab Results  Component Value Date   CHOL 156 04/13/2017   HDL 35 (L) 04/13/2017   LDLCALC 101 (H) 04/13/2017   TRIG 101 04/13/2017   CHOLHDL 4.5 04/13/2017   Lab Results  Component Value Date   HGBA1C 10.1 (H) 03/11/2018   Lab Results  Component Value Date   VITAMINB12 670 04/17/2017   Lab Results  Component Value Date   TSH 1.526 04/12/2017      ASSESSMENT AND PLAN 37 y.o. year old female  has a past medical history of Breast mass (02/2017), Dental crown present, Hypertension, Migraines, Necrotizing pancreatitis (03/2017), Pituitary adenoma ([place]), Seasonal allergies, and Sleep apnea. here with:  1.  Migraine headaches 2.  Tension type headaches  The patient will continue taking Flexeril at bedtime.  I have advised patient that she could switch her gabapentin to the a.m. to see if this helps with her migraine headaches.  Advised that if this causes too much drowsiness she should let us know.  The patient was not interested in starting any new medication at this time.  She will follow-up in 6 months or sooner if needed.     Ward Givens, MSN, NP-C 05/31/2018, 9:08 AM [facility] [address] [place], [place] [address] [phone]

## 2018-05-31 NOTE — Patient Instructions (Signed)
Your Plan:  Continue Flexeril Take Gabapentin 300 mg in the AM If your symptoms worsen or you develop new symptoms please let us know.   Thank you for coming to see [place] at [facility]. I hope we have been able to provide you high quality care today.  You may receive a patient satisfaction survey over the next few weeks. We would appreciate your feedback and comments so that we may continue to improve ourselves and the health of our patients.

## 2018-05-31 NOTE — Progress Notes (Signed)
I have read the note, and I agree with the clinical assessment and plan.  Kaylee Montgomery K Kaylee Montgomery   

## 2018-06-06 ENCOUNTER — Encounter: Payer: Self-pay | Admitting: Family Medicine

## 2018-06-07 MED FILL — CYCLOBENZAPRINE 5 MG TABLET: 5 | 30 days supply | Qty: 30 | Fill #6

## 2018-06-08 ENCOUNTER — Ambulatory Visit: Payer: 59 | Admitting: Family Medicine

## 2018-06-08 ENCOUNTER — Encounter: Payer: Self-pay | Admitting: Family Medicine

## 2018-06-08 VITALS — BP 164/118 | HR 87 | Resp 16 | Ht 64.0 in | Wt 206.0 lb

## 2018-06-08 DIAGNOSIS — E1169 Type 2 diabetes mellitus with other specified complication: Secondary | ICD-10-CM | POA: Diagnosis not present

## 2018-06-08 DIAGNOSIS — G43019 Migraine without aura, intractable, without status migrainosus: Secondary | ICD-10-CM | POA: Diagnosis not present

## 2018-06-08 DIAGNOSIS — I10 Essential (primary) hypertension: Secondary | ICD-10-CM

## 2018-06-08 MED ORDER — TRIAMTERENE-HCTZ 37.5-25 MG PO TABS: ORAL_TABLET | ORAL | 3 refills | Status: DC

## 2018-06-08 MED FILL — TRIAMTERENE/HCTZ 37.5/25 TB: 37.5-25 | 30 days supply | Qty: 45 | Fill #0

## 2018-06-08 NOTE — Patient Instructions (Addendum)
Please cancel 8/29 visirt and reschedule for first week in September  Fhis at least 5 days fasting labs ( [facility])  Please get t before next visit  Blood pressure is high, start maxzide 1.5 tablets every morning same time  Stop soda and commit to daily exercise for 30 mins at least  Toe nails need to be shorter, and cut is not infected, also watch shoes Thank you  for choosing [facility]. We consider it a privelige to serve you.  Delivering excellent health care in a caring and  compassionate way is our goal.  Partnering with you,  so that together we can achieve this goal is our strategy.

## 2018-06-08 NOTE — Assessment & Plan Note (Signed)
Recent headache , this has resolved

## 2018-06-08 NOTE — Assessment & Plan Note (Signed)
Reports improvement will f/u in September as planned Kaylee Montgomery is reminded of the importance of commitment to daily physical activity for 30 minutes or more, as able and the need to limit carbohydrate intake to 30 to 60 grams per meal to help with blood sugar control.   The need to take medication as prescribed, test blood sugar as directed, and to call between visits if there is a concern that blood sugar is uncontrolled is also discussed.   Kaylee Montgomery is reminded of the importance of daily foot exam, annual eye examination, and good blood sugar, blood pressure and cholesterol control.  Diabetic Labs Latest Ref Rng & Units 04/01/2018 03/11/2018 03/11/2018 06/27/2017 06/26/2017  HbA1c <5.7 % of total Hgb - 10.1(H) - - -  Microalbumin Not Estab. ug/mL 69.4(H) - - - -  Micro/Creat Ratio 0.0 - 30.0 mg/g creat 25.9 - - - -  Chol 0 - 200 mg/dL - - - - -  HDL >40 mg/dL - - - - -  Calc LDL 0 - 99 mg/dL - - - - -  Triglycerides <150 mg/dL - - - - -  Creatinine 0.44 - 1.00 mg/dL - 0.67 0.68 0.45 0.46   BP/Weight 06/08/2018 05/31/2018 05/19/2018 03/31/2018 03/23/2018 03/17/2018 4/70/9628  Systolic BP 366 294 - 765 - 465 035  Diastolic BP 465 88 - 90 - 90 107  Wt. (Lbs) 206 207 206 209.12 207 204 216  BMI 35.36 35.53 35.36 35.9 35.53 35.02 37.08   Foot/eye exam completion dates 03/17/2018  Foot Form Completion Done

## 2018-06-08 NOTE — Assessment & Plan Note (Signed)
Deteriorated. Patient re-educated about  the importance of commitment to a  minimum of 150 minutes of exercise per week.  The importance of healthy food choices with portion control discussed. Encouraged to start a food diary, count calories and to consider  joining a support group. Sample diet sheets offered. Goals set by the patient for the next several months.   Weight /BMI 06/08/2018 05/31/2018 05/19/2018  WEIGHT 206 lb 207 lb 206 lb  HEIGHT 5\' 4"  5\' 4"  5\' 4"   BMI 35.36 kg/m2 35.53 kg/m2 35.36 kg/m2

## 2018-06-08 NOTE — Progress Notes (Signed)
Kaylee Montgomery     MRN: [id number]      DOB: Feb 19, 1981   HPI Kaylee Montgomery is here because of elevated and uncontrolled blood pressure, she was told last week at the dentist her blood pressure was high Had been been on antihypertensive meds in the past States blood sugar is generally 113 Does still drink sodas On Saturday while shopping she became nauseated and slight headache with the nausea No neurologuic deficit noted    ROS See HPI  Denies recent fever or chills. Denies sinus pressure, nasal congestion, ear pain or sore throat. Denies chest congestion, productive cough or wheezing. Denies chest pains, palpitations and leg swelling Denies abdominal pain, nausea, vomiting,diarrhea or constipation.   Denies dysuria, frequency, hesitancy or incontinence. Denies joint pain, swelling and limitation in mobility. . Denies depression, anxiety or insomnia. Denies skin break down or rash.   PE  BP (!) 164/118   Pulse 87   Resp 16   Ht 5\' 4"  (1.626 m)   Wt 206 lb (93.4 kg)   SpO2 96%   BMI 35.36 kg/m   Patient alert and oriented and in no cardiopulmonary distress.  HEENT: No facial asymmetry, EOMI,   oropharynx pink and moist.  Neck supple no JVD, no mass.  Chest: Clear to auscultation bilaterally.  CVS: S1, S2 no murmurs, no S3.Regular rate.  ABD: Soft non tender.   Ext: No edema  MS: Adequate ROM spine, shoulders, hips and knees.  Skin: Intact, no ulcerations or rash noted.  Psych: Good eye contact, normal affect. Memory intact not anxious or depressed appearing.  CNS: CN 2-12 intact, power,  normal throughout.no focal deficits noted.   Assessment & Plan  Essential hypertension Uncontrolled, start maxzide 1.5 tablets DASH diet and commitment to daily physical activity for a minimum of 30 minutes discussed and encouraged, as a part of hypertension management. The importance of attaining a healthy weight is also discussed.  BP/Weight 06/08/2018 05/31/2018  05/19/2018 03/31/2018 03/23/2018 03/17/2018 8/41/3244  Systolic BP 010 272 - 536 - 644 034  Diastolic BP 742 88 - 90 - 90 107  Wt. (Lbs) 206 207 206 209.12 207 204 216  BMI 35.36 35.53 35.36 35.9 35.53 35.02 37.08       Type 2 diabetes mellitus with other specified complication [facility]) Reports improvement will f/u in September as planned Kaylee Montgomery is reminded of the importance of commitment to daily physical activity for 30 minutes or more, as able and the need to limit carbohydrate intake to 30 to 60 grams per meal to help with blood sugar control.   The need to take medication as prescribed, test blood sugar as directed, and to call between visits if there is a concern that blood sugar is uncontrolled is also discussed.   Kaylee Montgomery is reminded of the importance of daily foot exam, annual eye examination, and good blood sugar, blood pressure and cholesterol control.  Diabetic Labs Latest Ref Rng & Units 04/01/2018 03/11/2018 03/11/2018 06/27/2017 06/26/2017  HbA1c <5.7 % of total Hgb - 10.1(H) - - -  Microalbumin Not Estab. ug/mL 69.4(H) - - - -  Micro/Creat Ratio 0.0 - 30.0 mg/g creat 25.9 - - - -  Chol 0 - 200 mg/dL - - - - -  HDL >40 mg/dL - - - - -  Calc LDL 0 - 99 mg/dL - - - - -  Triglycerides <150 mg/dL - - - - -  Creatinine 0.44 - 1.00 mg/dL - 0.67 0.68 0.45  0.46   BP/Weight 06/08/2018 05/31/2018 05/19/2018 03/31/2018 03/23/2018 03/17/2018 8/59/2763  Systolic BP 943 200 - 379 - 444 619  Diastolic BP 012 88 - 90 - 90 107  Wt. (Lbs) 206 207 206 209.12 207 204 216  BMI 35.36 35.53 35.36 35.9 35.53 35.02 37.08   Foot/eye exam completion dates 03/17/2018  Foot Form Completion Done        Morbid obesity ([place]) Deteriorated. Patient re-educated about  the importance of commitment to a  minimum of 150 minutes of exercise per week.  The importance of healthy food choices with portion control discussed. Encouraged to start a food diary, count calories and to consider  joining a support  group. Sample diet sheets offered. Goals set by the patient for the next several months.   Weight /BMI 06/08/2018 05/31/2018 05/19/2018  WEIGHT 206 lb 207 lb 206 lb  HEIGHT 5\' 4"  5\' 4"  5\' 4"   BMI 35.36 kg/m2 35.53 kg/m2 35.36 kg/m2      Common migraine with intractable migraine Recent headache , this has resolved

## 2018-06-08 NOTE — Assessment & Plan Note (Signed)
Uncontrolled, start maxzide 1.5 tablets DASH diet and commitment to daily physical activity for a minimum of 30 minutes discussed and encouraged, as a part of hypertension management. The importance of attaining a healthy weight is also discussed.  BP/Weight 06/08/2018 05/31/2018 05/19/2018 03/31/2018 03/23/2018 03/17/2018 8/67/7373  Systolic BP 668 159 - 470 - 761 518  Diastolic BP 343 88 - 90 - 90 107  Wt. (Lbs) 206 207 206 209.12 207 204 216  BMI 35.36 35.53 35.36 35.9 35.53 35.02 37.08

## 2018-06-17 ENCOUNTER — Ambulatory Visit: Payer: 59 | Admitting: Family Medicine

## 2018-06-24 ENCOUNTER — Ambulatory Visit: Payer: 59 | Admitting: Family Medicine

## 2018-06-24 ENCOUNTER — Encounter: Payer: Self-pay | Admitting: Family Medicine

## 2018-06-30 ENCOUNTER — Encounter: Payer: Self-pay | Admitting: Family Medicine

## 2018-06-30 ENCOUNTER — Ambulatory Visit: Payer: 59 | Admitting: Family Medicine

## 2018-06-30 VITALS — BP 140/104 | HR 84 | Resp 15 | Ht 64.0 in | Wt 207.0 lb

## 2018-06-30 DIAGNOSIS — E1169 Type 2 diabetes mellitus with other specified complication: Secondary | ICD-10-CM

## 2018-06-30 DIAGNOSIS — R7301 Impaired fasting glucose: Secondary | ICD-10-CM | POA: Diagnosis not present

## 2018-06-30 DIAGNOSIS — I10 Essential (primary) hypertension: Secondary | ICD-10-CM

## 2018-06-30 MED ORDER — TRIAMTERENE-HCTZ 75-50 MG PO TABS: 1.0000 | ORAL_TABLET | Freq: Every day | ORAL | 3 refills | Status: DC

## 2018-06-30 MED FILL — TRIAMTERENE/HCTZ 75/50 TAB: 75-50 | 30 days supply | Qty: 30 | Fill #0

## 2018-06-30 NOTE — Patient Instructions (Addendum)
F/U in 2 months, call if you need me before  HBA1C, chemm 7 and EGFR at [facility] today, you are taking metformin one daily only  Now, and I am sending you the result as soon as I get it wuith recommendation  Goal for you is 6.5  New blood pressure med is sent in to take one daily, today take TWO blood pressure tablets once  You DO NEED the pneumonia vaccine  It is important that you exercise regularly at least 30 minutes 5 times a week. If you develop chest pain, have severe difficulty breathing, or feel very tired, stop exercising immediately and seek medical attention

## 2018-07-01 ENCOUNTER — Encounter: Payer: Self-pay | Admitting: Family Medicine

## 2018-07-01 ENCOUNTER — Other Ambulatory Visit: Payer: Self-pay | Admitting: Family Medicine

## 2018-07-01 LAB — BASIC METABOLIC PANEL WITH GFR
BUN: 9 mg/dL (ref 7–25)
CO2: 31 mmol/L (ref 20–32)
Calcium: 10 mg/dL (ref 8.6–10.2)
Chloride: 103 mmol/L (ref 98–110)
Creat: 0.68 mg/dL (ref 0.50–1.10)
GFR, Est African American: 129 mL/min/{1.73_m2} (ref >=60)
GFR, Est Non African American: 111 mL/min/{1.73_m2} (ref >=60)
Glucose, Bld: 112 mg/dL (ref 65–139)
Potassium: 3.9 mmol/L (ref 3.5–5.3)
Sodium: 141 mmol/L (ref 135–146)

## 2018-07-01 LAB — HEMOGLOBIN A1C
Hgb A1c MFr Bld: 6.3 % of total Hgb — ABNORMAL HIGH (ref <5.7)
Mean Plasma Glucose: 134 (calc)
eAG (mmol/L): 7.4 (calc)

## 2018-07-01 MED ORDER — METFORMIN HCL 1000 MG PO TABS: 1000.0000 mg | ORAL_TABLET | Freq: Every day | ORAL | 1 refills | Status: DC

## 2018-07-01 MED FILL — metFORMIN HCL 1000 MG TABS: 1000 | 30 days supply | Qty: 30 | Fill #0

## 2018-07-02 ENCOUNTER — Encounter: Payer: Self-pay | Admitting: Family Medicine

## 2018-07-02 NOTE — Assessment & Plan Note (Signed)
Markedly improved at goal on reduced dose of metformin Ms. Childers is reminded of the importance of commitment to daily physical activity for 30 minutes or more, as able and the need to limit carbohydrate intake to 30 to 60 grams per meal to help with blood sugar control.   The need to take medication as prescribed, test blood sugar as directed, and to call between visits if there is a concern that blood sugar is uncontrolled is also discussed.   Ms. Pensyl is reminded of the importance of daily foot exam, annual eye examination, and good blood sugar, blood pressure and cholesterol control.  Diabetic Labs Latest Ref Rng & Units 06/30/2018 04/01/2018 03/11/2018 03/11/2018 06/27/2017  HbA1c <5.7 % of total Hgb 6.3(H) - 10.1(H) - -  Microalbumin Not Estab. ug/mL - 69.4(H) - - -  Micro/Creat Ratio 0.0 - 30.0 mg/g creat - 25.9 - - -  Chol 0 - 200 mg/dL - - - - -  HDL >40 mg/dL - - - - -  Calc LDL 0 - 99 mg/dL - - - - -  Triglycerides <150 mg/dL - - - - -  Creatinine 0.50 - 1.10 mg/dL 0.68 - 0.67 0.68 0.45   BP/Weight 06/30/2018 06/08/2018 05/31/2018 05/19/2018 03/31/2018 03/23/2018 03/05/16  Systolic BP 494 496 759 - 163 - 846  Diastolic BP 659 935 88 - 90 - 90  Wt. (Lbs) 207 206 207 206 209.12 207 204  BMI 35.53 35.36 35.53 35.36 35.9 35.53 35.02   Foot/eye exam completion dates 03/17/2018  Foot Form Completion Done

## 2018-07-02 NOTE — Progress Notes (Signed)
Kaylee Montgomery     MRN: [id number]      DOB: 1981-05-31   HPI [name] is here for follow up and re-evaluation of chronic medical conditions, medication management and review of any available recent lab and radiology data.  Preventive health is updated, specifically  Cancer screening and Immunization.   Questions or concerns regarding consultations or procedures which the PT has had in the interim are  addressed. The PT denies any adverse reactions to current medications since the last visit.  States she has been exercising and following her diabetic diet with excellent blood sugars averaging 110 in the mornings. She has reduced her metformin to one daily Denies adverse side effects with blood pressure medication Putting off the flu vaccine until she gets to her job Denies polyuria, polydipsia, blurred vision , or hypoglycemic episodes.   ROS Denies recent fever or chills. Denies sinus pressure, nasal congestion, ear pain or sore throat. Denies chest congestion, productive cough or wheezing. Denies chest pains, palpitations and leg swelling Denies abdominal pain, nausea, vomiting,diarrhea or constipation.   Denies dysuria, frequency, hesitancy or incontinence. Denies joint pain, swelling and limitation in mobility. Denies headaches, seizures, numbness, or tingling. Denies depression, anxiety or insomnia. Denies skin break down or rash.   PE  BP (!) 140/104   Pulse 84   Resp 15   Ht 5\' 4"  (1.626 m)   Wt 207 lb (93.9 kg)   SpO2 98%   BMI 35.53 kg/m   Patient alert and oriented and in no cardiopulmonary distress.  HEENT: No facial asymmetry, EOMI,   oropharynx pink and moist.  Neck supple no JVD, no mass.  Chest: Clear to auscultation bilaterally.  CVS: S1, S2 no murmurs, no S3.Regular rate.  ABD: Soft non tender.   Ext: No edema  MS: Adequate ROM spine, shoulders, hips and knees.  Skin: Intact, no ulcerations or rash noted.  Psych: Good eye contact, normal  affect. Memory intact not anxious or depressed appearing.  CNS: CN 2-12 intact, power,  normal throughout.no focal deficits noted.   Assessment & Plan  Essential hypertension Uncontrolled , increase dose of maxzide DASH diet and commitment to daily physical activity for a minimum of 30 minutes discussed and encouraged, as a part of hypertension management. The importance of attaining a healthy weight is also discussed.  BP/Weight 06/30/2018 06/08/2018 05/31/2018 05/19/2018 03/31/2018 03/23/2018 0/81/4481  Systolic BP 856 314 970 - 263 - 785  Diastolic BP 885 027 88 - 90 - 90  Wt. (Lbs) 207 206 207 206 209.12 207 204  BMI 35.53 35.36 35.53 35.36 35.9 35.53 35.02       Type 2 diabetes mellitus with other specified complication (HCC) Markedly improved at goal on reduced dose of metformin [name] is reminded of the importance of commitment to daily physical activity for 30 minutes or more, as able and the need to limit carbohydrate intake to 30 to 60 grams per meal to help with blood sugar control.   The need to take medication as prescribed, test blood sugar as directed, and to call between visits if there is a concern that blood sugar is uncontrolled is also discussed.   [name] is reminded of the importance of daily foot exam, annual eye examination, and good blood sugar, blood pressure and cholesterol control.  Diabetic Labs Latest Ref Rng & Units 06/30/2018 04/01/2018 03/11/2018 03/11/2018 06/27/2017  HbA1c <5.7 % of total Hgb 6.3(H) - 10.1(H) - -  Microalbumin Not Estab. ug/mL -  69.4(H) - - -  Micro/Creat Ratio 0.0 - 30.0 mg/g creat - 25.9 - - -  Chol 0 - 200 mg/dL - - - - -  HDL >40 mg/dL - - - - -  Calc LDL 0 - 99 mg/dL - - - - -  Triglycerides <150 mg/dL - - - - -  Creatinine 0.50 - 1.10 mg/dL 0.68 - 0.67 0.68 0.45   BP/Weight 06/30/2018 06/08/2018 05/31/2018 05/19/2018 03/31/2018 03/23/2018 5/63/8756  Systolic BP 433 295 188 - 416 - 606  Diastolic BP 301 601 88 - 90 - 90  Wt.  (Lbs) 207 206 207 206 209.12 207 204  BMI 35.53 35.36 35.53 35.36 35.9 35.53 35.02   Foot/eye exam completion dates 03/17/2018  Foot Form Completion Done        Morbid obesity (HCC) Unchanged. Patient re-educated about  the importance of commitment to a  minimum of 150 minutes of exercise per week.  The importance of healthy food choices with portion control discussed. Encouraged to start a food diary, count calories and to consider  joining a support group. Sample diet sheets offered. Goals set by the patient for the next several months.   Weight /BMI 06/30/2018 06/08/2018 05/31/2018  WEIGHT 207 lb 206 lb 207 lb  HEIGHT 5\' 4"  5\' 4"  5\' 4"   BMI 35.53 kg/m2 35.36 kg/m2 35.53 kg/m2

## 2018-07-02 NOTE — Assessment & Plan Note (Signed)
Uncontrolled , increase dose of maxzide DASH diet and commitment to daily physical activity for a minimum of 30 minutes discussed and encouraged, as a part of hypertension management. The importance of attaining a healthy weight is also discussed.  BP/Weight 06/30/2018 06/08/2018 05/31/2018 05/19/2018 03/31/2018 03/23/2018 7/99/8001  Systolic BP 239 359 409 - 050 - 256  Diastolic BP 154 884 88 - 90 - 90  Wt. (Lbs) 207 206 207 206 209.12 207 204  BMI 35.53 35.36 35.53 35.36 35.9 35.53 35.02

## 2018-07-02 NOTE — Assessment & Plan Note (Signed)
Unchanged. Patient re-educated about  the importance of commitment to a  minimum of 150 minutes of exercise per week.  The importance of healthy food choices with portion control discussed. Encouraged to start a food diary, count calories and to consider  joining a support group. Sample diet sheets offered. Goals set by the patient for the next several months.   Weight /BMI 06/30/2018 06/08/2018 05/31/2018  WEIGHT 207 lb 206 lb 207 lb  HEIGHT 5\' 4"  5\' 4"  5\' 4"   BMI 35.53 kg/m2 35.36 kg/m2 35.53 kg/m2

## 2018-07-09 MED FILL — GABAPENTIN 300 MG CAPSULE: 300 | 30 days supply | Qty: 30 | Fill #6

## 2018-07-09 MED FILL — CYCLOBENZAPRINE 5 MG TABLET: 5 | 30 days supply | Qty: 30 | Fill #7

## 2018-07-09 MED FILL — METOPROLOL TARTRATE 50 MG T: 50 | 30 days supply | Qty: 90 | Fill #4

## 2018-08-06 MED FILL — CYCLOBENZAPRINE 5 MG TABLET: 5 | 30 days supply | Qty: 30 | Fill #8

## 2018-08-06 MED FILL — METOPROLOL TARTRATE 50 MG T: 50 | 30 days supply | Qty: 90 | Fill #5

## 2018-08-06 MED FILL — GABAPENTIN 300 MG CAPSULE: 300 | 30 days supply | Qty: 30 | Fill #7

## 2018-08-06 MED FILL — metFORMIN HCL 1000 MG TABS: 1000 | 30 days supply | Qty: 30 | Fill #1

## 2018-08-23 ENCOUNTER — Ambulatory Visit: Payer: 59 | Admitting: Nutrition

## 2018-08-31 ENCOUNTER — Ambulatory Visit: Payer: 59 | Admitting: Family Medicine

## 2018-09-07 MED FILL — GABAPENTIN 300 MG CAPSULE: 300 | 30 days supply | Qty: 30 | Fill #8

## 2018-09-07 MED FILL — CYCLOBENZAPRINE 5 MG TABLET: 5 | 30 days supply | Qty: 30 | Fill #9

## 2018-09-07 MED FILL — metFORMIN HCL 1000 MG TABS: 1000 | 30 days supply | Qty: 30 | Fill #2

## 2018-10-07 MED FILL — metFORMIN HCL 1000 MG TABS: 1000 | 30 days supply | Qty: 30 | Fill #3

## 2018-10-07 MED FILL — GABAPENTIN 300 MG CAPSULE: 300 | 30 days supply | Qty: 30 | Fill #9

## 2018-10-07 MED FILL — METOPROLOL TARTRATE 50 MG T: 50 | 30 days supply | Qty: 90 | Fill #6

## 2018-10-07 MED FILL — CYCLOBENZAPRINE 5 MG TABLET: 5 | 30 days supply | Qty: 30 | Fill #10

## 2018-10-19 ENCOUNTER — Ambulatory Visit: Payer: 59 | Admitting: Family Medicine

## 2018-10-19 ENCOUNTER — Encounter: Payer: Self-pay | Admitting: Family Medicine

## 2018-10-19 VITALS — BP 140/100 | HR 106 | Resp 12 | Ht 64.0 in | Wt 210.0 lb

## 2018-10-19 DIAGNOSIS — E1169 Type 2 diabetes mellitus with other specified complication: Secondary | ICD-10-CM | POA: Diagnosis not present

## 2018-10-19 DIAGNOSIS — I10 Essential (primary) hypertension: Secondary | ICD-10-CM | POA: Diagnosis not present

## 2018-10-19 MED ORDER — TRIAMTERENE-HCTZ 75-50 MG PO TABS: 1.0000 | ORAL_TABLET | Freq: Every day | ORAL | 2 refills | Status: DC

## 2018-10-19 MED ORDER — METFORMIN HCL 1000 MG PO TABS: 1000.0000 mg | ORAL_TABLET | Freq: Every day | ORAL | 2 refills | Status: DC

## 2018-10-19 MED ORDER — AMLODIPINE BESYLATE 10 MG PO TABS: 10.0000 mg | ORAL_TABLET | Freq: Every day | ORAL | 1 refills | Status: DC

## 2018-10-19 NOTE — Patient Instructions (Addendum)
F/U in 6 weeks, call if you need me before.  New additional medication for blood pressure is amlodipine 10 mg daily  Please get fasting labs ordered 1 wek before next visit, blood testss only, no microalb needed  Need eye exam    Return for flu vaccine asap!   Please work on healthy lifestyle change  It is important that you exercise regularly at least 30 minutes 5 times a week. If you develop chest pain, have severe difficulty breathing, or feel very tired, stop exercising immediately and seek medical attention   Please work on good  health habits so that your health will improve. 1. Commitment to daily physical activity for 30 to 60  minutes, if you are able to do this.  2. Commitment to wise food choices. Aim for half of your  food intake to be vegetable and fruit, one quarter starchy foods, and one quarter protein. Try to eat on a regular schedule  3 meals per day, snacking between meals should be limited to vegetables or fruits or small portions of nuts. 64 ounces of water per day is generally recommended, unless you have specific health conditions, like heart failure or kidney failure where you will need to limit fluid intake.  3. Commitment to sufficient and a  good quality of physical and mental rest daily, generally between 6 to 8 hours per day.  WITH PERSISTANCE AND PERSEVERANCE, THE IMPOSSIBLE , BECOMES THE NORM!

## 2018-10-24 ENCOUNTER — Encounter: Payer: Self-pay | Admitting: Family Medicine

## 2018-10-24 NOTE — Assessment & Plan Note (Signed)
Deteriorated. Patient re-educated about  the importance of commitment to a  minimum of 150 minutes of exercise per week.  The importance of healthy food choices with portion control discussed. Encouraged to start a food diary, count calories and to consider  joining a support group. Sample diet sheets offered. Goals set by the patient for the next several months.   Weight /BMI 10/19/2018 06/30/2018 06/08/2018  WEIGHT 210 lb 0.6 oz 207 lb 206 lb  HEIGHT 5\' 4"  5\' 4"  5\' 4"   BMI 36.05 kg/m2 35.53 kg/m2 35.36 kg/m2

## 2018-10-24 NOTE — Assessment & Plan Note (Signed)
Uncontrolled, increase dose of amlodipine with 6 week f/u DASH diet and commitment to daily physical activity for a minimum of 30 minutes discussed and encouraged, as a part of hypertension management. The importance of attaining a healthy weight is also discussed.  BP/Weight 10/19/2018 06/30/2018 06/08/2018 05/31/2018 05/19/2018 4/66/5993 02/24/176  Systolic BP 939 030 092 330 - 076 -  Diastolic BP 226 333 545 88 - 90 -  Wt. (Lbs) 210.04 207 206 207 206 209.12 207  BMI 36.05 35.53 35.36 35.53 35.36 35.9 35.53

## 2018-10-24 NOTE — Assessment & Plan Note (Signed)
Kaylee Montgomery is reminded of the importance of commitment to daily physical activity for 30 minutes or more, as able and the need to limit carbohydrate intake to 30 to 60 grams per meal to help with blood sugar control.   The need to take medication as prescribed, test blood sugar as directed, and to call between visits if there is a concern that blood sugar is uncontrolled is also discussed.   Kaylee Montgomery is reminded of the importance of daily foot exam, annual eye examination, and good blood sugar, blood pressure and cholesterol control. Updated lab needed at/ before next visit.  Diabetic Labs Latest Ref Rng & Units 06/30/2018 04/01/2018 03/11/2018 03/11/2018 06/27/2017  HbA1c <5.7 % of total Hgb 6.3(H) - 10.1(H) - -  Microalbumin Not Estab. ug/mL - 69.4(H) - - -  Micro/Creat Ratio 0.0 - 30.0 mg/g creat - 25.9 - - -  Chol 0 - 200 mg/dL - - - - -  HDL >40 mg/dL - - - - -  Calc LDL 0 - 99 mg/dL - - - - -  Triglycerides <150 mg/dL - - - - -  Creatinine 0.50 - 1.10 mg/dL 0.68 - 0.67 0.68 0.45   BP/Weight 10/19/2018 06/30/2018 06/08/2018 05/31/2018 05/19/2018 1/77/1165 04/27/382  Systolic BP 338 329 191 660 - 600 -  Diastolic BP 459 977 414 88 - 90 -  Wt. (Lbs) 210.04 207 206 207 206 209.12 207  BMI 36.05 35.53 35.36 35.53 35.36 35.9 35.53   Foot/eye exam completion dates 03/17/2018  Foot Form Completion Done

## 2018-10-24 NOTE — Progress Notes (Signed)
Kaylee Montgomery     MRN: [id number]      DOB: 16-Aug-1981   HPI Kaylee Montgomery is here for follow up and re-evaluation of chronic medical conditions, medication management and review of any available recent lab and radiology data.  Preventive health is updated, specifically  Cancer screening and Immunization. Refuses/ delays recommended pneumonia vaccine and also flu vaccine  Eye exam past due and she will schedule Denies polyuria, polydipsia, blurred vision , or hypoglycemic episodes. Reports FBG range from 110 to 136. The PT denies any adverse reactions to current medications since the last visit.  There are no new concerns. Not exercising as regularly as in the past with weight at a standstill, plans to change this  ROS Denies recent fever or chills. Denies sinus pressure, nasal congestion, ear pain or sore throat. Denies chest congestion, productive cough or wheezing. Denies chest pains, palpitations and leg swelling Denies abdominal pain, nausea, vomiting,diarrhea or constipation.   Denies dysuria, frequency, hesitancy or incontinence. Denies joint pain, swelling and limitation in mobility. Denies headaches, seizures, numbness, or tingling. Denies depression, anxiety or insomnia. Denies skin break down or rash.   PE  BP (!) 140/100 (BP Location: Left Arm, Patient Position: Sitting, Cuff Size: Large)   Pulse (!) 106   Resp 12   Ht 5\' 4"  (1.626 m)   Wt 210 lb 0.6 oz (95.3 kg)   SpO2 96% Comment: room air  BMI 36.05 kg/m   Patient alert and oriented and in no cardiopulmonary distress.  HEENT: No facial asymmetry, EOMI,   oropharynx pink and moist.  Neck supple no JVD, no mass.  Chest: Clear to auscultation bilaterally.  CVS: S1, S2 no murmurs, no S3.Regular rate.  ABD: Soft non tender.   Ext: No edema  MS: Adequate ROM spine, shoulders, hips and knees.  Skin: Intact, no ulcerations or rash noted.  Psych: Good eye contact, normal affect. Memory intact not anxious  or depressed appearing.  CNS: CN 2-12 intact, power,  normal throughout.no focal deficits noted.   Assessment & Plan  Essential hypertension Uncontrolled, increase dose of amlodipine with 6 week f/u DASH diet and commitment to daily physical activity for a minimum of 30 minutes discussed and encouraged, as a part of hypertension management. The importance of attaining a healthy weight is also discussed.  BP/Weight 10/19/2018 06/30/2018 06/08/2018 05/31/2018 05/19/2018 3/73/4287 03/27/1156  Systolic BP 262 035 597 416 - 384 -  Diastolic BP 536 468 032 88 - 90 -  Wt. (Lbs) 210.04 207 206 207 206 209.12 207  BMI 36.05 35.53 35.36 35.53 35.36 35.9 35.53       Morbid obesity (HCC) Deteriorated. Patient re-educated about  the importance of commitment to a  minimum of 150 minutes of exercise per week.  The importance of healthy food choices with portion control discussed. Encouraged to start a food diary, count calories and to consider  joining a support group. Sample diet sheets offered. Goals set by the patient for the next several months.   Weight /BMI 10/19/2018 06/30/2018 06/08/2018  WEIGHT 210 lb 0.6 oz 207 lb 206 lb  HEIGHT 5\' 4"  5\' 4"  5\' 4"   BMI 36.05 kg/m2 35.53 kg/m2 35.36 kg/m2      Type 2 diabetes mellitus with other specified complication [facility]) Kaylee Montgomery is reminded of the importance of commitment to daily physical activity for 30 minutes or more, as able and the need to limit carbohydrate intake to 30 to 60 grams per meal to help with  blood sugar control.   The need to take medication as prescribed, test blood sugar as directed, and to call between visits if there is a concern that blood sugar is uncontrolled is also discussed.   Kaylee Montgomery is reminded of the importance of daily foot exam, annual eye examination, and good blood sugar, blood pressure and cholesterol control. Updated lab needed at/ before next visit.  Diabetic Labs Latest Ref Rng & Units 06/30/2018  04/01/2018 03/11/2018 03/11/2018 06/27/2017  HbA1c <5.7 % of total Hgb 6.3(H) - 10.1(H) - -  Microalbumin Not Estab. ug/mL - 69.4(H) - - -  Micro/Creat Ratio 0.0 - 30.0 mg/g creat - 25.9 - - -  Chol 0 - 200 mg/dL - - - - -  HDL >40 mg/dL - - - - -  Calc LDL 0 - 99 mg/dL - - - - -  Triglycerides <150 mg/dL - - - - -  Creatinine 0.50 - 1.10 mg/dL 0.68 - 0.67 0.68 0.45   BP/Weight 10/19/2018 06/30/2018 06/08/2018 05/31/2018 05/19/2018 1/95/0932 03/26/1244  Systolic BP 809 983 382 505 - 397 -  Diastolic BP 673 419 379 88 - 90 -  Wt. (Lbs) 210.04 207 206 207 206 209.12 207  BMI 36.05 35.53 35.36 35.53 35.36 35.9 35.53   Foot/eye exam completion dates 03/17/2018  Foot Form Completion Done

## 2018-11-09 MED FILL — GABAPENTIN 300 MG CAPSULE: 300 | 30 days supply | Qty: 30 | Fill #10

## 2018-11-09 MED FILL — metFORMIN HCL 1000 MG TABS: 1000 | 30 days supply | Qty: 30 | Fill #4

## 2018-11-09 MED FILL — CYCLOBENZAPRINE 5 MG TABLET: 5 | 30 days supply | Qty: 30 | Fill #11

## 2018-11-30 ENCOUNTER — Encounter: Payer: Self-pay | Admitting: Family Medicine

## 2018-12-02 ENCOUNTER — Encounter: Payer: Self-pay | Admitting: Neurology

## 2018-12-02 ENCOUNTER — Ambulatory Visit: Payer: 59 | Admitting: Neurology

## 2018-12-02 ENCOUNTER — Telehealth: Payer: Self-pay

## 2018-12-02 VITALS — BP 118/80 | HR 83 | Ht 64.0 in | Wt 209.3 lb

## 2018-12-02 DIAGNOSIS — G43019 Migraine without aura, intractable, without status migrainosus: Secondary | ICD-10-CM

## 2018-12-02 DIAGNOSIS — M542 Cervicalgia: Secondary | ICD-10-CM | POA: Diagnosis not present

## 2018-12-02 MED ORDER — METOPROLOL TARTRATE 75 MG PO TABS: 75.0000 mg | ORAL_TABLET | Freq: Two times a day (BID) | ORAL | 3 refills | Status: DC

## 2018-12-02 MED ORDER — CYCLOBENZAPRINE HCL 5 MG PO TABS: 5.0000 mg | ORAL_TABLET | Freq: Every day | ORAL | 3 refills | Status: DC

## 2018-12-02 MED ORDER — GABAPENTIN 300 MG PO CAPS: 300.0000 mg | ORAL_CAPSULE | Freq: Every day | ORAL | 3 refills | Status: DC

## 2018-12-02 MED FILL — METOPROLOL TARTRATE 50 MG T: 50 | 30 days supply | Qty: 90 | Fill #0

## 2018-12-02 MED FILL — CYCLOBENZAPRINE 5 MG TABLET: 5 | 30 days supply | Qty: 30 | Fill #0

## 2018-12-02 NOTE — Telephone Encounter (Signed)
Received a call from Haynes Dage with [facility] stating the 75 mg metoprolol was not in stock, but 50 mg is currently in stock.  Haynes Dage wanted to confirm if the pt could take 50 mg 1.5 tablets daily. I advised this would be appropriate. She states the pt has taken this dosage before and confirmed the medication could be split in half. She states she will still dispense a 90 day supply (# 135)

## 2018-12-02 NOTE — Progress Notes (Signed)
Reason for visit: Migraine headache  Kaylee Montgomery is an 38 y.o. female  History of present illness:  Kaylee Montgomery is a 38 year old right-handed black female with a history of migraine headache.  The patient has done fairly well on her current regimen, she uses Flexeril 10 mg at night and she is on metoprolol and gabapentin.  She indicates that she is having about 1 headache every 2 weeks, the headaches usually are not disabling, she may have some neck and shoulder discomfort while at work, sometimes she will develop eyestrain headaches, wearing her glasses seems to help.  Overall, she is pleased with her progress, she takes Tylenol if needed for the headache.  She has been told that she cannot be on nonsteroidal anti-inflammatory medications secondary to recurring bouts of pancreatitis.  Past Medical History:  Diagnosis Date  . Breast mass 02/2017  . Dental crown present   . Hypertension    states BP fluctuates; has been on med. x 1 yr.  . Migraines   . 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 Pituitary adenoma ([place])   . Seasonal allergies   . Sleep apnea    no CPAP use in > 6 mos.    Past Surgical History:  Procedure Laterality Date  . BREAST LUMPECTOMY WITH RADIOACTIVE SEED LOCALIZATION Left 03/05/2017   Procedure: LEFT BREAST LUMPECTOMY WITH RADIOACTIVE SEED LOCALIZATION;  Surgeon: Donnie Mesa, MD;  Location: [place];  Service: General;  Laterality: Left;  . CESAREAN SECTION    . CHOLECYSTECTOMY  08/2017   [facility]  . ESOPHAGOGASTRODUODENOSCOPY  07/21/2017   [facility]: Dr. Delrae Alfred. healthy appearing cyst cavity without necrotic material that was decreased in size. Removal of stent scheduled for 11/12.   Marland Kitchen EUS  06/19/2017   [facility]: 7 x 7.3 cm cyst with necrotic material noted adjacent to the body of the pancreas. FNA without malignancy.  Marland Kitchen LAPAROSCOPIC GASTRIC SLEEVE RESECTION N/A 02/18/2016   Procedure: LAPAROSCOPIC GASTRIC SLEEVE  RESECTION, UPPER ENDO;  Surgeon: Greer Pickerel, MD;  Location: [facility];  Service: General;  Laterality: N/A;  . 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 TONSILLECTOMY  age 48    Family History  Problem Relation Age of Onset  . Heart disease Father        stent  . Migraines Father   . Migraines Son   . Diabetes Maternal Grandfather   . Colon cancer Neg Hx     Social history:  reports that she has never smoked. She has never used smokeless tobacco. She reports that she does not drink alcohol or use drugs.    Allergies  Allergen Reactions  . Bromocriptine Mesylate Nausea Only  . Depo-Medrol [Methylprednisolone Acetate] Itching and Rash  . Dilaudid [Hydromorphone Hcl] Itching  . Imitrex [Sumatriptan] Rash    Medications:  Prior to Admission medications   Medication Sig Start Date End Date Taking? Authorizing Provider  acetaminophen (TYLENOL) 500 MG tablet Take 1,000 mg by mouth every 6 (six) hours as needed.   Yes [provider]  amLODipine (NORVASC) 10 MG tablet Take 1 tablet (10 mg total) by mouth daily. 10/19/18  Yes [name], MD  blood glucose meter kit and supplies Dispense based on patient and insurance preference. Once daily testing dx e11.9 05/03/18  Yes [name], MD  cyclobenzaprine (FLEXERIL) 5 MG tablet Take 1 tablet (5 mg total) by mouth at bedtime. 11/26/17  Yes Kathrynn Ducking, MD  gabapentin (NEURONTIN) 300 MG capsule Take 1 capsule (300 mg total) by mouth daily. 11/26/17  Yes Kathrynn Ducking, MD  Lancets MISC 1 each by Does not apply route 2 (two) times daily.   Yes [provider]  metFORMIN (GLUCOPHAGE) 1000 MG tablet Take 1 tablet (1,000 mg total) by mouth daily with breakfast. Dose reduction effective 07/01/2018 10/19/18  Yes [name], MD  Metoprolol Tartrate 75 MG TABS Take 75 mg by mouth 2 (two) times daily. 11/26/17  Yes Kathrynn Ducking, MD    triamterene-hydrochlorothiazide (MAXZIDE) 75-50 MG tablet Take 1 tablet by mouth daily. 10/19/18  Yes [name], MD    ROS:  Out of a complete 14 system review of symptoms, the patient complains only of the following symptoms, and all other reviewed systems are negative.  Ear pain, runny nose Cough Neck pain, neck stiffness  Blood pressure 118/80, pulse 83, height '5\' 4"'$  (1.626 m), weight 209 lb 5 oz (94.9 kg).  Physical Exam  General: The patient is alert and cooperative at the time of the examination.  Skin: No significant peripheral edema is noted.   Neurologic Exam  Mental status: The patient is alert and oriented x 3 at the time of the examination. The patient has apparent normal recent and remote memory, with an apparently normal attention span and concentration ability.   Cranial nerves: Facial symmetry is present. Speech is normal, no aphasia or dysarthria is noted. Extraocular movements are full. Visual fields are full.  Motor: The patient has good strength in all 4 extremities.  Sensory examination: Soft touch sensation is symmetric on the face, arms, and legs.  Coordination: The patient has good finger-nose-finger and heel-to-shin bilaterally.  Gait and station: The patient has a normal gait. Tandem gait is normal. Romberg is negative. No drift is seen.  Reflexes: Deep tendon reflexes are symmetric.   Assessment/Plan:  1.  Migraine headache  The patient has done better with her migraines recently.  She will continue the current medications, prescriptions were sent in for gabapentin, Flexeril, and metoprolol.  She will follow-up here in 1 year, sooner if needed.  Jill Alexanders MD 12/02/2018 9:25 AM  [facility] [address] [place], [place] [address]  Phone [phone] Fax [phone]

## 2018-12-03 DIAGNOSIS — J00 Acute nasopharyngitis [common cold]: Secondary | ICD-10-CM | POA: Diagnosis not present

## 2018-12-03 MED FILL — BENZONATATE 200 MG CAPS: 200 | 10 days supply | Qty: 30 | Fill #0

## 2018-12-05 DIAGNOSIS — R05 Cough: Secondary | ICD-10-CM | POA: Diagnosis not present

## 2018-12-06 ENCOUNTER — Other Ambulatory Visit: Payer: Self-pay | Admitting: Family Medicine

## 2018-12-06 ENCOUNTER — Encounter: Payer: Self-pay | Admitting: Family Medicine

## 2018-12-06 MED ORDER — PROMETHAZINE-DM 6.25-15 MG/5ML PO SYRP: ORAL_SOLUTION | ORAL | 0 refills | Status: DC

## 2018-12-09 MED FILL — metFORMIN HCL 1000 MG TABS: 1000 | 30 days supply | Qty: 30 | Fill #5

## 2018-12-09 MED FILL — GABAPENTIN 300 MG CAPSULE: 300 | 31 days supply | Qty: 31 | Fill #0

## 2018-12-16 ENCOUNTER — Other Ambulatory Visit: Payer: Self-pay

## 2018-12-16 MED FILL — AMLODIPINE BESYLATE 10 MG T: 10 | 31 days supply | Qty: 31 | Fill #1

## 2018-12-28 ENCOUNTER — Telehealth: Payer: Self-pay | Admitting: Gastroenterology

## 2018-12-28 ENCOUNTER — Encounter: Payer: Self-pay | Admitting: Adult Health

## 2018-12-28 DIAGNOSIS — K862 Cyst of pancreas: Secondary | ICD-10-CM

## 2018-12-28 NOTE — Telephone Encounter (Signed)
[facility] clinical pool: patient needs CT with pancreatic protocol due to history of necrotizing pancreatitis and pseudocyst. Needs routine office visit as well.

## 2018-12-28 NOTE — Telephone Encounter (Signed)
LMOVM for pt. She will have to go to [facility] d/t insurance requirement and they will call her to schedule CT.

## 2018-12-29 ENCOUNTER — Encounter: Payer: Self-pay | Admitting: Gastroenterology

## 2018-12-29 NOTE — Telephone Encounter (Signed)
We need CPT code of [address] and resubmit with CT abd with and without contrast (pancreatic protocol).

## 2018-12-29 NOTE — Telephone Encounter (Signed)
Approval received. Order needs to be for CT abd with and without contrast, specify pancreatic protocol.   Reference #: [id number].    Expiration date: 02/12/2019  Disregard my prior note regarding changing things. Just call in the reference number and CPT code. Should go right through.

## 2018-12-29 NOTE — Telephone Encounter (Signed)
PA done via [facility] website. Received message a physician is required to complete the notification process. [phone] and select option #3. Must be done within 3 days or request will be deemed expired and must reinitiate the notification process. It did not even give me an option to upload clinicals.  Case# [id number]  Please advise Vicente Males, thanks

## 2018-12-29 NOTE — Telephone Encounter (Signed)
SCHEDULED AND LETTER SENT  °

## 2018-12-29 NOTE — Telephone Encounter (Signed)
Order updated. PA auth# [id number] dates 12/29/2018-02/12/2019 [facility] aware. LMOVM for pt

## 2019-01-04 NOTE — Telephone Encounter (Signed)
Spoke with [facility] and they have not been able to reach patient after multiple attempts. They are going to try 1 more time and then will be closed out if patient does not call back. I have also attempted to call patient without any response back. FYI to AB

## 2019-01-10 MED FILL — metFORMIN HCL 1000 MG TABS: 1000 | 31 days supply | Qty: 31 | Fill #0 | Status: TO

## 2019-01-10 MED FILL — METOPROLOL TARTRATE 50 MG T: 50 | 30 days supply | Qty: 90 | Fill #1

## 2019-01-10 MED FILL — AMLODIPINE BESYLATE 10 MG T: 10 | 31 days supply | Qty: 31 | Fill #2 | Status: TO

## 2019-01-10 MED FILL — GABAPENTIN 300 MG CAPSULE: 300 | 31 days supply | Qty: 31 | Fill #1 | Status: TO

## 2019-01-10 MED FILL — CYCLOBENZAPRINE 5 MG TABLET: 5 | 30 days supply | Qty: 30 | Fill #1 | Status: TO

## 2019-01-17 NOTE — Telephone Encounter (Signed)
[facility] unable to reach patient. Kaylee Montgomery mailed patient an appt letter for 5/18. FYI to AB

## 2019-02-11 MED FILL — metFORMIN HCL 1000 MG TABS: 1000 | 31 days supply | Qty: 31 | Fill #0

## 2019-02-11 MED FILL — CYCLOBENZAPRINE HCL 5 MG TA: 5 | 30 days supply | Qty: 30 | Fill #0

## 2019-02-11 MED FILL — GABAPENTIN 300 MG CAPSULE: 300 | 31 days supply | Qty: 31 | Fill #0

## 2019-02-11 MED FILL — AMLODIPINE BESYLATE 10 MG T: 10 | 31 days supply | Qty: 31 | Fill #0

## 2019-02-18 ENCOUNTER — Encounter: Payer: Self-pay | Admitting: Adult Health

## 2019-02-22 ENCOUNTER — Telehealth: Payer: Self-pay

## 2019-02-22 NOTE — Telephone Encounter (Signed)
Left vm for pt to return my call in regards to scheduling her injection appt.

## 2019-02-22 NOTE — Telephone Encounter (Signed)
-----   Message from Kathrynn Ducking, MD sent at 02/21/2019 10:45 AM EDT ----- Please call the patient to come him for trigger point injections, we used about 14 cc of a one-to-one mixture of betamethasone and Marcaine last time.

## 2019-03-04 ENCOUNTER — Ambulatory Visit
Admission: RE | Admit: 2019-03-04 | Discharge: 2019-03-04 | Disposition: A | Discharge status: Home / Self Care | Payer: 59 | Source: Ambulatory Visit | Attending: Gastroenterology | Admitting: Gastroenterology

## 2019-03-04 ENCOUNTER — Other Ambulatory Visit: Payer: Self-pay | Admitting: Gastroenterology

## 2019-03-04 DIAGNOSIS — K8591 Acute pancreatitis with uninfected necrosis, unspecified: Secondary | ICD-10-CM

## 2019-03-04 DIAGNOSIS — I878 Other specified disorders of veins: Secondary | ICD-10-CM

## 2019-03-04 DIAGNOSIS — K862 Cyst of pancreas: Secondary | ICD-10-CM

## 2019-03-07 ENCOUNTER — Ambulatory Visit (INDEPENDENT_AMBULATORY_CARE_PROVIDER_SITE_OTHER): Payer: 59 | Admitting: Gastroenterology

## 2019-03-07 ENCOUNTER — Other Ambulatory Visit: Payer: Self-pay

## 2019-03-07 ENCOUNTER — Encounter: Payer: Self-pay | Admitting: Gastroenterology

## 2019-03-07 DIAGNOSIS — Z8719 Personal history of other diseases of the digestive system: Secondary | ICD-10-CM

## 2019-03-07 NOTE — Progress Notes (Signed)
Primary Care Physician:  [name], MD  Primary GI: Dr. Oneida Alar   Virtual Visit via Telephone Note Due to COVID-19, visit is conducted virtually and was requested by patient.   I connected with Taresa Goodhart on 03/07/19 at  2:00 PM EDT by telephone and verified that I am speaking with the correct person using two identifiers.   I discussed the limitations, risks, security and privacy concerns of performing an evaluation and management service by telephone and the availability of in person appointments. I also discussed with the patient that there may be a patient responsible charge related to this service. The patient expressed understanding and agreed to proceed.  Chief Complaint  Patient presents with  . Pancreatitis    f/u. 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 Underwent EUS with FNA with no evidence of malignancy.Endoscopic necrosectomy. Removal of stent 08/31/17. Cholecystectomy Nov 2018. Last CT May 7482 with uncomplicated pseudocyst. We had discussed repeating CT, but this is not necessary as it is stable and she is asymptomatic. She also notes that she is a difficult stick and requires multiple attempts for IV access.   Denies any abdominal pain, N/V, GERD, early satiety, unexplained weight loss. She has more frequent BMs s/p cholecystectomy but denies diarrhea. Overall doing well.   Past Medical History:  Diagnosis Date  . Breast mass 02/2017  . Dental crown present   . Hypertension    states BP fluctuates; has been on med. x 1 yr.  . Migraines   . 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 Pituitary adenoma ([place])   . Seasonal allergies   . Sleep apnea    no CPAP use in > 6 mos.     Past Surgical History:  Procedure Laterality Date  . BREAST LUMPECTOMY WITH RADIOACTIVE  SEED LOCALIZATION Left 03/05/2017   Procedure: LEFT BREAST LUMPECTOMY WITH RADIOACTIVE SEED LOCALIZATION;  Surgeon: Donnie Mesa, MD;  Location: [place];  Service: General;  Laterality: Left;  . CESAREAN SECTION    . CHOLECYSTECTOMY  08/2017   [facility]  . ESOPHAGOGASTRODUODENOSCOPY  07/21/2017   [facility]: Dr. Delrae Alfred. healthy appearing cyst cavity without necrotic material that was decreased in size. Removal of stent scheduled for 11/12.   Marland Kitchen EUS  06/19/2017   [facility]: 7 x 7.3 cm cyst with necrotic material noted adjacent to the body of the pancreas. FNA without malignancy.  Marland Kitchen LAPAROSCOPIC GASTRIC SLEEVE RESECTION N/A 02/18/2016   Procedure: LAPAROSCOPIC GASTRIC SLEEVE RESECTION, UPPER ENDO;  Surgeon: Greer Pickerel, MD;  Location: [facility];  Service: General;  Laterality: N/A;  . 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 TONSILLECTOMY  age 46     Current Meds  Medication Sig  . acetaminophen (TYLENOL) 500 MG tablet Take 1,000 mg by mouth every 6 (six) hours as needed.  Marland Kitchen amLODipine (NORVASC) 10 MG tablet Take 1 tablet (10 mg total) by mouth daily.  . blood glucose meter kit and supplies Dispense based on patient and insurance preference. Once daily testing dx e11.9  . cyclobenzaprine (FLEXERIL) 5 MG tablet Take 1 tablet (5 mg total) by mouth at bedtime.  . gabapentin (NEURONTIN) 300 MG capsule Take 1 capsule (300 mg total) by mouth daily.  . Lancets MISC 1 each by Does not apply  route 2 (two) times daily.  . metFORMIN (GLUCOPHAGE) 1000 MG tablet Take 1 tablet (1,000 mg total) by mouth daily with breakfast. Dose reduction effective 07/01/2018  . Metoprolol Tartrate 75 MG TABS Take 75 mg by mouth 2 (two) times daily.     Family History  Problem Relation Age of Onset  . Heart disease Father        stent  . Migraines Father   . Migraines Son   . Diabetes Maternal Grandfather   . Colon cancer Neg Hx      Social History   Socioeconomic History  . Marital status: Single    Spouse name: Not on file  . Number of children: 1  . Years of education: Not on file  . Highest education level: Not on file  Occupational History  . Occupation: STUDENT   . Occupation: CASEWORKER    Employer: [employer]  Social Needs  . Financial resource strain: Not on file  . Food insecurity:    Worry: Not on file    Inability: Not on file  . Transportation needs:    Medical: Not on file    Non-medical: Not on file  Tobacco Use  . Smoking status: Never Smoker  . Smokeless tobacco: Never Used  Substance and Sexual Activity  . Alcohol use: No    Alcohol/week: 0.0 standard drinks    Comment: never drank regularly or heavily but NO etoh at all since 01/2017  . Drug use: No  . Sexual activity: Not on file  Lifestyle  . Physical activity:    Days per week: Not on file    Minutes per session: Not on file  . Stress: Not on file  Relationships  . Social connections:    Talks on phone: Not on file    Gets together: Not on file    Attends religious service: Not on file    Active member of club or organization: Not on file    Attends meetings of clubs or organizations: Not on file    Relationship status: Not on file  Other Topics Concern  . Not on file  Social History Narrative   Patient is right handed.   Patient drinks 1-2 cups of caffeine daily.       Review of Systems: Gen: Denies fever, chills, anorexia. Denies fatigue, weakness, weight loss.  CV: Denies chest pain, palpitations, syncope, peripheral edema, and claudication. Resp: Denies dyspnea at rest, cough, wheezing, coughing up blood, and pleurisy. GI: see HPI Derm: Denies rash, itching, dry skin Psych: Denies depression, anxiety, memory loss, confusion. No homicidal or suicidal ideation.  Heme: Denies bruising, bleeding, and enlarged lymph nodes.  Observations/Objective: No distress. Video call with no distress. Pleasant and  cooperative.   Assessment and Plan: 38 year old female with history of necrotizing pancreatitis with thorough evaluation as noted in HPI, undergoing endoscopic necrosectomy almost 2 years ago, cholecystectomy Nov 2018. Last CT in May 2019 without any complicating features. No need for surveillance unless clinically changes. She is to call with any abdominal pain, early satiety, or other concerning signs/symptoms. We will see her back as needed.   Follow Up Instructions:    I discussed the assessment and treatment plan with the patient. The patient was provided an opportunity to ask questions and all were answered. The patient agreed with the plan and demonstrated an understanding of the instructions.   The patient was advised to call back or seek an in-person evaluation if the symptoms worsen or if  the condition fails to improve as anticipated.  I provided 10 minutes of face-to-face time during this video encounter.  Annitta Needs, PhD, ANP-BC [facility]

## 2019-03-07 NOTE — Telephone Encounter (Signed)
Called [facility] and aware to cancel

## 2019-03-07 NOTE — Patient Instructions (Signed)
Please call us if any concerns, abdominal pain, recurrent symptoms!   We will see you back as needed!  I enjoyed seeing you again today! As you know, I value our relationship and want to provide genuine, compassionate, and quality care. I welcome your feedback. If you receive a survey regarding your visit,  I greatly appreciate you taking time to fill this out. See you next time!  Annitta Needs, PhD, ANP-BC [facility]

## 2019-03-07 NOTE — Telephone Encounter (Signed)
Mindy: we can cancel CT. Office visit today and asymptomatic.

## 2019-03-10 ENCOUNTER — Other Ambulatory Visit: Payer: 59

## 2019-03-10 NOTE — Progress Notes (Signed)
cc'ed to pcp °

## 2019-03-11 ENCOUNTER — Ambulatory Visit (INDEPENDENT_AMBULATORY_CARE_PROVIDER_SITE_OTHER): Payer: 59 | Admitting: Family Medicine

## 2019-03-11 ENCOUNTER — Encounter: Payer: Self-pay | Admitting: Family Medicine

## 2019-03-11 ENCOUNTER — Other Ambulatory Visit: Payer: Self-pay

## 2019-03-11 VITALS — BP 120/88 | HR 93 | Resp 12 | Ht 64.0 in | Wt 215.0 lb

## 2019-03-11 DIAGNOSIS — E1169 Type 2 diabetes mellitus with other specified complication: Secondary | ICD-10-CM | POA: Diagnosis not present

## 2019-03-11 DIAGNOSIS — E559 Vitamin D deficiency, unspecified: Secondary | ICD-10-CM

## 2019-03-11 DIAGNOSIS — I10 Essential (primary) hypertension: Secondary | ICD-10-CM | POA: Diagnosis not present

## 2019-03-11 NOTE — Progress Notes (Signed)
Subjective:     Patient ID: Kaylee Montgomery, female   DOB: October 25, 1980, 38 y.o.   MRN: [id number]  Kaylee Montgomery is a 38 year old female patient of Dr. Griffin Dakin.  Who is well-known to the clinic.  Who presents today for recheck of her blood pressure and overall chronic conditions.  She reports that she is taking her medications as directed and is not having any issues.  She denies having any complaints today in the office.  She reports since increasing her Norvasc to 10 mg back in December she is done very well with this and she has not had any signs and symptoms of having elevated BP.  She reports that her migraines are well under control at this time is not had any issues with that and is managing them with medication with Dr. Jannifer Franklin.  She reports that she is taking her Glucophage/metformin as directed.  Is not having any GI upset with that.  She did not get her labs drawn prior to this appointment but she is fasting today and can get them drawn right after this appointment.   Overall is doing well reports that she was trying to eat better and joined a [employer] gym to work out.  But she has stopped going to the gym secondary to the pandemic.  But she reports that she knows she is put on some weight.  She has placed 5 pounds on since December.  She wants to get back into working out and trying to get back into shape.  As she knows that this is what helps manage her diabetes as well.  Additionally she would like to lose a little bit of weight as well.  She denies having any signs or symptoms of infection including cough, shortness of breath, fever, chills, any sinus congestion and or drainage.  She denies having any vision changes, dizziness, headaches, chest pain, chest palpitations or leg swelling.  She denies having any changes in her bowel or bladder habits.  She reports that she is sleeping well and does not have any signs or symptoms of depression or anxiety at this time.  She denies having  any trouble with any of her joints or range of motion as well.    Past Medical, Surgical, Social History, Allergies, and Medications have been Reviewed.   Past Medical History:  Diagnosis Date   Breast mass 02/2017   Dental crown present    Hypertension    states BP fluctuates; has been on med. x 1 yr.   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   Past Surgical History:  Procedure Laterality Date   BREAST LUMPECTOMY WITH RADIOACTIVE SEED LOCALIZATION Left 03/05/2017   Procedure: LEFT BREAST LUMPECTOMY WITH RADIOACTIVE SEED LOCALIZATION;  Surgeon: Donnie Mesa, MD;  Location: [place];  Service: General;  Laterality: Left;   CESAREAN SECTION     CHOLECYSTECTOMY  08/2017   [facility]   ESOPHAGOGASTRODUODENOSCOPY  07/21/2017   [facility]: Dr. Delrae Alfred. healthy appearing cyst cavity without necrotic material that was decreased in size. Removal of stent scheduled for 11/12.    EUS  06/19/2017   [facility]: 7 x 7.3 cm cyst with necrotic material noted adjacent to the body of the pancreas. FNA without malignancy.   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)   TONSILLECTOMY  age 38   Social History   Socioeconomic History   Marital status: Single    Spouse name: Not on file   Number of children: 1   Years of education: Not on file   Highest education level: Not on file  Occupational History   Occupation: STUDENT    Occupation: CASEWORKER    Employer: [employer] [place] resource strain: Not on file   Food insecurity:    Worry: Not on  file    Inability: Not on file   Transportation needs:    Medical: Not on file    Non-medical: Not on file  Tobacco Use   Smoking status: Never Smoker   Smokeless tobacco: Never Used  Substance and Sexual Activity   Alcohol use: No    Alcohol/week: 0.0 standard drinks    Comment: never drank regularly or heavily but NO etoh at all since 01/2017   Drug use: No   Sexual activity: Not on file  Lifestyle   Physical activity:    Days per week: Not on file    Minutes per session: Not on file   Stress: Not on file  Relationships   Social connections:    Talks on phone: Not on file    Gets together: Not on file    Attends religious service: Not on file    Active member of club or organization: Not on file    Attends meetings of clubs or organizations: Not on file    Relationship status: Not on file   Intimate partner violence:    Fear of current or ex partner: Not on file    Emotionally abused: Not on file    Physically abused: Not on file    Forced sexual activity: Not on file  Other Topics Concern   Not on file  Social History Narrative   Patient is right handed.   Patient drinks 1-2 cups of caffeine daily.    Outpatient Encounter Medications as of 03/11/2019  Medication Sig   acetaminophen (TYLENOL) 500 MG tablet Take 1,000 mg by mouth every 6 (six) hours as needed.   amLODipine (NORVASC) 10 MG tablet Take 1 tablet (10 mg total) by mouth daily.   blood glucose meter kit and supplies Dispense based on patient and insurance preference. Once daily testing dx e11.9   cyclobenzaprine (FLEXERIL) 5 MG tablet Take 1 tablet (5 mg total) by mouth at bedtime.   gabapentin (NEURONTIN) 300 MG capsule Take 1 capsule (300 mg total) by mouth daily.   Lancets MISC 1 each by Does not apply route 2 (two) times daily.   metFORMIN (GLUCOPHAGE) 1000 MG tablet Take 1 tablet (1,000 mg total) by mouth daily with breakfast. Dose reduction effective 07/01/2018   Metoprolol Tartrate  75 MG TABS Take 75 mg by mouth 2 (two) times daily.   No facility-administered encounter medications on file as of 03/11/2019.    Allergies  Allergen Reactions   Bromocriptine Mesylate Nausea Only   Depo-Medrol [Methylprednisolone Acetate] Itching and Rash   Dilaudid [Hydromorphone Hcl] Itching   Imitrex [Sumatriptan] Rash    Review of Systems  Constitutional: Negative for activity change, appetite change, chills and fever.  HENT: Negative for congestion, sinus pressure, sinus pain, sore  throat, tinnitus and voice change.   Eyes: Negative for visual disturbance.  Respiratory: Negative for cough, chest tightness and shortness of breath.   Cardiovascular: Negative for chest pain, palpitations and leg swelling.  Gastrointestinal: Negative.   Endocrine: Negative for polydipsia, polyphagia and polyuria.  Genitourinary: Negative.   Musculoskeletal: Negative.   Skin: Negative.   Neurological: Negative for dizziness and headaches.  Hematological: Negative.   Psychiatric/Behavioral: Negative for confusion and sleep disturbance.  All other systems reviewed and are negative.      Objective:     BP 120/88    Pulse 93    Resp 12    Ht '5\' 4"'$  (1.626 m)    Wt 215 lb (97.5 kg)    SpO2 97%    BMI 36.90 kg/m   Physical Exam Vitals signs and nursing note reviewed.  Constitutional:      Appearance: Normal appearance. She is obese.  HENT:     Head: Normocephalic and atraumatic.     Right Ear: External ear normal.     Left Ear: External ear normal.     Nose: Nose normal.  Eyes:     General: No scleral icterus.       Right eye: No discharge.        Left eye: No discharge.     Conjunctiva/sclera: Conjunctivae normal.  Neck:     Musculoskeletal: Normal range of motion and neck supple.  Cardiovascular:     Rate and Rhythm: Normal rate and regular rhythm.     Pulses: Normal pulses.     Heart sounds: Normal heart sounds.  Pulmonary:     Effort: Pulmonary effort is normal.     Breath  sounds: Normal breath sounds.  Abdominal:     General: Bowel sounds are normal.     Palpations: Abdomen is soft.  Musculoskeletal: Normal range of motion.  Skin:    General: Skin is warm and dry.     Capillary Refill: Capillary refill takes less than 2 seconds.  Neurological:     Mental Status: She is alert and oriented to person, place, and time.  Psychiatric:        Mood and Affect: Mood normal.        Behavior: Behavior normal.        Thought Content: Thought content normal.        Judgment: Judgment normal.        Assessment and Plan        1. Essential hypertension Controlled, has been working on this along with an increase in her amlodipine back in December she is controlled today in the office.  Denies having any signs or symptoms of elevated BP.  Does need updated labs.  We will be drawing these today as she is fasting.  2. Morbid obesity ([place]) Deteriorated, she has put on weight since December though she reports that she did join a CrossFit to try to help lose weight but she knows that she has put on weight since being in the lockdown with the pandemic.  She was educated about the importance of maintaining some form of exercise even though the gym is not open.  Such as walking 30 minutes a day on at least 5 days a week for minimum of 150 minutes of exercise per week.  Reiterated the importance of healthy food choices and portion control discussed. Need updated labs will be drawn these today as she is fasting.  - Lipid panel - Hemoglobin A1c - TSH  3. Type 2 diabetes mellitus with other specified complication, without long-term current use of insulin ([facility]) Previous A1c was 6.3 in September 2019, this had improved greatly from May 2019 where it was 10.1.  She has not drawn her A1c prior to this appointment will place labs for her to get as she is fasting.  Additionally she is encouraged to make sure that she maintains her weight and physical activity and monitors her  carbohydrate intake to a 30 to 60 g per meal to help control blood sugar.  Educated on the importance of physical activity to help with blood sugar control.  The need to take medication as prescribed, test blood sugar as directed, and to call between visits if there is a concern that blood sugar is uncontrolled is also discussed.   Ms. Freiman is reminded of the importance of daily foot exam, annual eye examination, and good blood sugar, blood pressure and cholesterol control.    - Microalbumin / creatinine urine ratio - Lipid panel - COMPLETE METABOLIC PANEL WITH GFR - Hemoglobin A1c  4. Vitamin D deficiency Previous low level will assess this and make sure that she is not having any ongoing continued low levels of vitamin D, as I do not want her to leach it from her bones so if she is low we will assess the need for supplementation.  - VITAMIN D 25 Hydroxy (Vit-D Deficiency, Fractures)   Return in about 15 weeks (around 06/24/2019) for annual exam.   Perlie Mayo, DNP, AGNP-BC [address], [place] [address] Office Hours: Mon-Thurs 8 am-5 pm; Fri 8 am-12 pm Office Phone:  [phone]  Office Fax: [phone]

## 2019-03-11 NOTE — Patient Instructions (Addendum)
    Thank you for coming into the office today. I appreciate the opportunity to provide you with the care for your health and wellness. Today we discussed: Overall health  Please get your labs drawn today.  As we talked I will notify you of any findings as soon as we have the results.  Release these to my chart.  Continue taking all your medications as directed. Your next appointment is actually going to be your annual.  So we will put it in [place] as your last one was in June.   Continue to try to work towards walking as soon as the weather allows.  And get back into CrossFit and or some type of exercise regime once it is safe to do so.  Please continue to practice social distancing during this time as we opened back up, to protect you, your family and our community.  Please continue to wear your mask.  And practice good hand hygiene.  [place] YOUR HANDS WELL AND FREQUENTLY. AVOID TOUCHING YOUR FACE, UNLESS YOUR HANDS ARE FRESHLY WASHED.  GET FRESH AIR DAILY. STAY HYDRATED WITH WATER.   It was a pleasure to see you and I look forward to continuing to work together on your health and well-being. Please do not hesitate to call the office if you need care or have questions about your care.  Have a wonderful day and week. With Gratitude, Cherly [place], DNP, AGNP-BC

## 2019-03-12 ENCOUNTER — Other Ambulatory Visit: Payer: Self-pay | Admitting: Neurology

## 2019-03-12 LAB — HEMOGLOBIN A1C
Hgb A1c MFr Bld: 7.7 % of total Hgb — ABNORMAL HIGH (ref <5.7)
Mean Plasma Glucose: 174 (calc)
eAG (mmol/L): 9.7 (calc)

## 2019-03-12 LAB — COMPLETE METABOLIC PANEL WITH GFR
AG Ratio: 1.9 (calc) (ref 1.0–2.5)
ALT: 46 U/L — ABNORMAL HIGH (ref 6–29)
AST: 27 U/L (ref 10–30)
Albumin: 4.8 g/dL (ref 3.6–5.1)
Alkaline phosphatase (APISO): 50 U/L (ref 31–125)
BUN: 7 mg/dL (ref 7–25)
CO2: 27 mmol/L (ref 20–32)
Calcium: 9.6 mg/dL (ref 8.6–10.2)
Chloride: 100 mmol/L (ref 98–110)
Creat: 0.56 mg/dL (ref 0.50–1.10)
GFR, Est African American: 136 mL/min/{1.73_m2} (ref >=60)
GFR, Est Non African American: 117 mL/min/{1.73_m2} (ref >=60)
Globulin: 2.5 g/dL (calc) (ref 1.9–3.7)
Glucose, Bld: 218 mg/dL — ABNORMAL HIGH (ref 65–99)
Potassium: 3.7 mmol/L (ref 3.5–5.3)
Sodium: 139 mmol/L (ref 135–146)
Total Bilirubin: 1.1 mg/dL (ref 0.2–1.2)
Total Protein: 7.3 g/dL (ref 6.1–8.1)

## 2019-03-12 LAB — LIPID PANEL
Cholesterol: 203 mg/dL — ABNORMAL HIGH (ref <200)
HDL: 40 mg/dL — ABNORMAL LOW (ref >=50)
LDL Cholesterol (Calc): 135 mg/dL (calc) — ABNORMAL HIGH
Non-HDL Cholesterol (Calc): 163 mg/dL (calc) — ABNORMAL HIGH (ref <130)
Total CHOL/HDL Ratio: 5.1 (calc) — ABNORMAL HIGH (ref <5.0)
Triglycerides: 149 mg/dL (ref <150)

## 2019-03-12 LAB — MICROALBUMIN / CREATININE URINE RATIO
Creatinine, Urine: 117 mg/dL (ref 20–275)
Microalb Creat Ratio: 48 mcg/mg creat — ABNORMAL HIGH (ref <30)
Microalb, Ur: 5.6 mg/dL

## 2019-03-12 LAB — VITAMIN D 25 HYDROXY (VIT D DEFICIENCY, FRACTURES): Vit D, 25-Hydroxy: 6 ng/mL — ABNORMAL LOW (ref 30–100)

## 2019-03-12 LAB — TSH: TSH: 1.6 mIU/L

## 2019-03-12 MED FILL — GABAPENTIN 300 MG CAPSULE: 300 | 31 days supply | Qty: 31 | Fill #1

## 2019-03-12 MED FILL — metFORMIN HCL 1000 MG TABS: 1000 | 31 days supply | Qty: 31 | Fill #1

## 2019-03-12 MED FILL — AMLODIPINE BESYLATE 10 MG T: 10 | 31 days supply | Qty: 31 | Fill #1

## 2019-03-12 MED FILL — CYCLOBENZAPRINE HCL 5 MG TA: 5 | 30 days supply | Qty: 30 | Fill #1

## 2019-03-22 ENCOUNTER — Other Ambulatory Visit: Payer: Self-pay | Admitting: Family Medicine

## 2019-03-22 DIAGNOSIS — I10 Essential (primary) hypertension: Secondary | ICD-10-CM

## 2019-03-22 DIAGNOSIS — E1169 Type 2 diabetes mellitus with other specified complication: Secondary | ICD-10-CM

## 2019-03-22 DIAGNOSIS — E785 Hyperlipidemia, unspecified: Secondary | ICD-10-CM

## 2019-03-22 DIAGNOSIS — E559 Vitamin D deficiency, unspecified: Secondary | ICD-10-CM

## 2019-03-22 MED ORDER — GLIPIZIDE 5 MG PO TABS: 5.0000 mg | ORAL_TABLET | Freq: Every day | ORAL | 3 refills | Status: DC

## 2019-03-22 MED ORDER — ATORVASTATIN CALCIUM 10 MG PO TABS: 10.0000 mg | ORAL_TABLET | Freq: Every day | ORAL | 3 refills | Status: DC

## 2019-03-22 MED ORDER — VITAMIN D (ERGOCALCIFEROL) 1.25 MG (50000 UNIT) PO CAPS: 50000.0000 [IU] | ORAL_CAPSULE | ORAL | 1 refills | Status: DC

## 2019-03-22 MED FILL — VIT D2 1.25 MG (50,000 UNIT: 1.25 MG | 28 days supply | Qty: 4 | Fill #0

## 2019-03-22 MED FILL — ATORVASTATIN 10 MG TABLET: 10 | 30 days supply | Qty: 30 | Fill #0

## 2019-03-22 MED FILL — glipiZIDE 5 MG TABS: 5 | 30 days supply | Qty: 30 | Fill #0

## 2019-03-22 NOTE — Progress Notes (Signed)
In review with Dr Moshe Cipro, It is recommended that you be started on a medication for your cholesterol as it is elevated.  Additionally we suggest that you are adding on a second medication called glipizide, to help with your diabetes management as you are A1c has jumped to 7.7%.  Your vitamin D level is critically on the lower end.  And you will require weekly vitamin D as well.  All 3 of these medications can be sent to your pharmacy.  Please let us know if you have any questions or concerns.  Please make sure that you are continuing to try to implement 30 minutes of exercise as well.  As this will help with not only your weight and your diabetes but also heart health.

## 2019-04-08 ENCOUNTER — Encounter: Payer: Self-pay | Admitting: Family Medicine

## 2019-04-12 MED FILL — CYCLOBENZAPRINE 5 MG TABLET: 5 | 30 days supply | Qty: 30 | Fill #0

## 2019-04-12 MED FILL — metFORMIN HCL 1000 MG TABS: 1000 | 31 days supply | Qty: 31 | Fill #0

## 2019-04-12 MED FILL — AMLODIPINE BESYLATE 10 MG T: 10 | 25 days supply | Qty: 25 | Fill #0

## 2019-04-12 MED FILL — GABAPENTIN 300 MG CAPSULE: 300 | 31 days supply | Qty: 31 | Fill #0

## 2019-04-19 MED FILL — glipiZIDE 5 MG TABS: 5 | 30 days supply | Qty: 30 | Fill #0

## 2019-04-19 MED FILL — VIT D2 1.25 MG (50,000 UNIT: 1.25 MG | 28 days supply | Qty: 4 | Fill #0

## 2019-04-19 MED FILL — ATORVASTATIN 10 MG TABLET: 10 | 30 days supply | Qty: 30 | Fill #0

## 2019-04-19 MED FILL — METOPROLOL TARTRATE 50 MG T: 50 | 30 days supply | Qty: 90 | Fill #2

## 2019-05-16 ENCOUNTER — Other Ambulatory Visit: Payer: Self-pay | Admitting: Family Medicine

## 2019-05-16 MED FILL — VIT D2 1.25 MG (50,000 UNIT: 1.25 MG | 28 days supply | Qty: 4 | Fill #1

## 2019-05-16 MED FILL — GABAPENTIN 300 MG CAPSULE: 300 | 31 days supply | Qty: 31 | Fill #1

## 2019-05-16 MED FILL — glipiZIDE 5 MG TABS: 5 | 30 days supply | Qty: 30 | Fill #1

## 2019-05-16 MED FILL — ATORVASTATIN 10 MG TABLET: 10 | 30 days supply | Qty: 30 | Fill #1

## 2019-05-16 MED FILL — CYCLOBENZAPRINE 5 MG TABLET: 5 | 30 days supply | Qty: 30 | Fill #1

## 2019-05-16 MED FILL — AMLODIPINE BESYLATE 10 MG T: 10 | 30 days supply | Qty: 30 | Fill #0

## 2019-05-16 MED FILL — METOPROLOL TARTRATE 50 MG T: 50 | 30 days supply | Qty: 90 | Fill #3

## 2019-05-16 MED FILL — metFORMIN HCL 1000 MG TABS: 1000 | 31 days supply | Qty: 31 | Fill #1

## 2019-05-25 ENCOUNTER — Encounter: Payer: Self-pay | Admitting: Family Medicine

## 2019-05-25 ENCOUNTER — Other Ambulatory Visit: Payer: Self-pay

## 2019-05-25 ENCOUNTER — Ambulatory Visit (INDEPENDENT_AMBULATORY_CARE_PROVIDER_SITE_OTHER): Payer: 59 | Admitting: Family Medicine

## 2019-05-25 VITALS — BP 108/80 | HR 101 | Temp 98.9°F | Resp 12 | Ht 64.0 in | Wt 208.0 lb

## 2019-05-25 DIAGNOSIS — Z0000 Encounter for general adult medical examination without abnormal findings: Secondary | ICD-10-CM | POA: Diagnosis not present

## 2019-05-25 DIAGNOSIS — I10 Essential (primary) hypertension: Secondary | ICD-10-CM

## 2019-05-25 DIAGNOSIS — E1169 Type 2 diabetes mellitus with other specified complication: Secondary | ICD-10-CM

## 2019-05-25 NOTE — Patient Instructions (Signed)
Thank you for coming into the office today. I appreciate the opportunity to provide you with the care for your health and wellness. Today we discussed:   Follow Up: 3 months for weight and DM check  No labs today-sign referral for labs from [place]:  It is recommended that you get at least 30 minutes of aerobic exercise at least 5 days/week (for weight loss, you may need as much as 60-90 minutes). This can be any activity that gets your heart rate up. This can be divided in 10-15 minute intervals if needed, but try and build up your endurance at least once a week.  Weight bearing exercise is also recommended twice weekly.  Eat a healthy diet with lots of vegetables, fruits and fiber.  "Colorful" foods have a lot of vitamins (ie green vegetables, tomatoes, red peppers, etc).  Limit sweet tea, regular sodas and alcoholic beverages, all of which has a lot of calories and sugar.  Up to 1 alcoholic drink daily may be beneficial for women (unless trying to lose weight, watch sugars).  Drink a lot of water.  Calcium recommendations are 1200-1500 mg daily (1500 mg for postmenopausal women or women without ovaries), and vitamin D 1000 IU daily.  This should be obtained from diet and/or supplements (vitamins), and calcium should not be taken all at once, but in divided doses.  Monthly self breast exams and yearly mammograms for women over the age of 87 is recommended.  Sunscreen of at least SPF 30 should be used on all sun-exposed parts of the skin when outside between the hours of 10 am and 4 pm (not just when at beach or pool, but even with exercise, golf, tennis, and yard work!)  Use a sunscreen that says "broad spectrum" so it covers both UVA and UVB rays, and make sure to reapply every 1-2 hours.  Remember to change the batteries in your smoke detectors when changing your clock times in the spring and fall.  Use your seat belt every time you are in a car, and  please drive safely and not be distracted with cell phones and texting while driving.  Please continue to practice social distancing to keep you, your family, and our community safe.  If you must go out, please wear a Mask and practice good handwashing.  [place] YOUR HANDS WELL AND FREQUENTLY. AVOID TOUCHING YOUR FACE, UNLESS YOUR HANDS ARE FRESHLY WASHED.  GET FRESH AIR DAILY. STAY HYDRATED WITH WATER.   It was a pleasure to see you and I look forward to continuing to work together on your health and well-being. Please do not hesitate to call the office if you need care or have questions about your care.  Have a wonderful day and week. With Gratitude, Cherly Beach, DNP, AGNP-BC   Diabetes Mellitus and Nutrition, Adult When you have diabetes (diabetes mellitus), it is very important to have healthy eating habits because your blood sugar (glucose) levels are greatly affected by what you eat and drink. Eating healthy foods in the appropriate amounts, at about the same times every day, can help you:  Control your blood glucose.  Lower your risk of heart disease.  Improve your blood pressure.  Reach or maintain a healthy weight. Every person with diabetes is different, and each person has different needs for a meal plan. Your health care provider may recommend that you work with a diet and nutrition specialist (dietitian) to make a meal plan that is  best for you. Your meal plan may vary depending on factors such as:  The calories you need.  The medicines you take.  Your weight.  Your blood glucose, blood pressure, and cholesterol levels.  Your activity level.  Other health conditions you have, such as heart or kidney disease. How do carbohydrates affect me? Carbohydrates, also called carbs, affect your blood glucose level more than any other type of food. Eating carbs naturally raises the amount of glucose in your blood. Carb counting is a method for keeping track of how many  carbs you eat. Counting carbs is important to keep your blood glucose at a healthy level, especially if you use insulin or take certain oral diabetes medicines. It is important to know how many carbs you can safely have in each meal. This is different for every person. Your dietitian can help you calculate how many carbs you should have at each meal and for each snack. Foods that contain carbs include:  Bread, cereal, rice, pasta, and crackers.  Potatoes and corn.  Peas, beans, and lentils.  Milk and yogurt.  Fruit and juice.  Desserts, such as cakes, cookies, ice cream, and candy. How does alcohol affect me? Alcohol can cause a sudden decrease in blood glucose (hypoglycemia), especially if you use insulin or take certain oral diabetes medicines. Hypoglycemia can be a life-threatening condition. Symptoms of hypoglycemia (sleepiness, dizziness, and confusion) are similar to symptoms of having too much alcohol. If your health care provider says that alcohol is safe for you, follow these guidelines:  Limit alcohol intake to no more than 1 drink per day for nonpregnant women and 2 drinks per day for men. One drink equals 12 oz of beer, 5 oz of wine, or 1 oz of hard liquor.  Do not drink on an empty stomach.  Keep yourself hydrated with water, diet soda, or unsweetened iced tea.  Keep in mind that regular soda, juice, and other mixers may contain a lot of sugar and must be counted as carbs. What are tips for following this plan?  Reading food labels  Start by checking the serving size on the "Nutrition Facts" label of packaged foods and drinks. The amount of calories, carbs, fats, and other nutrients listed on the label is based on one serving of the item. Many items contain more than one serving per package.  Check the total grams (g) of carbs in one serving. You can calculate the number of servings of carbs in one serving by dividing the total carbs by 15. For example, if a food has 30  g of total carbs, it would be equal to 2 servings of carbs.  Check the number of grams (g) of saturated and trans fats in one serving. Choose foods that have low or no amount of these fats.  Check the number of milligrams (mg) of salt (sodium) in one serving. Most people should limit total sodium intake to less than 2,300 mg per day.  Always check the nutrition information of foods labeled as "low-fat" or "nonfat". These foods may be higher in added sugar or refined carbs and should be avoided.  Talk to your dietitian to identify your daily goals for nutrients listed on the label. Shopping  Avoid buying canned, premade, or processed foods. These foods tend to be high in fat, sodium, and added sugar.  Shop around the outside edge of the grocery store. This includes fresh fruits and vegetables, bulk grains, fresh meats, and fresh dairy. Cooking  Use low-heat  cooking methods, such as baking, instead of high-heat cooking methods like deep frying.  Cook using healthy oils, such as olive, canola, or sunflower oil.  Avoid cooking with butter, cream, or high-fat meats. Meal planning  Eat meals and snacks regularly, preferably at the same times every day. Avoid going long periods of time without eating.  Eat foods high in fiber, such as fresh fruits, vegetables, beans, and whole grains. Talk to your dietitian about how many servings of carbs you can eat at each meal.  Eat 4-6 ounces (oz) of lean protein each day, such as lean meat, chicken, fish, eggs, or tofu. One oz of lean protein is equal to: ? 1 oz of meat, chicken, or fish. ? 1 egg. ?  cup of tofu.  Eat some foods each day that contain healthy fats, such as avocado, nuts, seeds, and fish. Lifestyle  Check your blood glucose regularly.  Exercise regularly as told by your health care provider. This may include: ? 150 minutes of moderate-intensity or vigorous-intensity exercise each week. This could be brisk walking, biking, or water  aerobics. ? Stretching and doing strength exercises, such as yoga or weightlifting, at least 2 times a week.  Take medicines as told by your health care provider.  Do not use any products that contain nicotine or tobacco, such as cigarettes and e-cigarettes. If you need help quitting, ask your health care provider.  Work with a Social worker or diabetes educator to identify strategies to manage stress and any emotional and social challenges. Questions to ask a health care provider  Do I need to meet with a diabetes educator?  Do I need to meet with a dietitian?  What number can I call if I have questions?  When are the best times to check my blood glucose? Where to find more information:  American Diabetes Association: diabetes.org  Academy of Nutrition and Dietetics: www.eatright.[employer] of Diabetes and Digestive and Kidney Diseases (NIH): [employer] Summary  A healthy meal plan will help you control your blood glucose and maintain a healthy lifestyle.  Working with a diet and nutrition specialist (dietitian) can help you make a meal plan that is best for you.  Keep in mind that carbohydrates (carbs) and alcohol have immediate effects on your blood glucose levels. It is important to count carbs and to use alcohol carefully. This information is not intended to replace advice given to you by your health care provider. Make sure you discuss any questions you have with your health care provider. Document Released: 07/03/2005 Document Revised: 09/18/2017 Document Reviewed: 11/10/2016 Elsevier Patient Education  2020 [employer].

## 2019-05-25 NOTE — Progress Notes (Signed)
Health Maintenance reviewed -  Immunization History  Administered Date(s) Administered  . Influenza Split 07/28/2013, 07/20/2014, 08/06/2015  . Td 10/20/1997   Last Pap smear: July 31 Last mammogram: breast exam at GYN Last colonoscopy: n/a Last DEXA: n/a Dentist: twice year Ophtho: yearly  Exercise: not as much as she should  Other doctors caring for patient include:  Patient Care Team: [name], MD as PCP - [facility], Marga Melnick, MD as Consulting Physician (Gastroenterology)    Subjective:   HPI  Kaylee Montgomery is a 38 y.o. female who presents for annual wellness visit and follow-up on chronic medical conditions.  She has the following concerns:  Would like more info on diet for DM. Reports having trouble with blood sugar control. Reports readings in the 200 range. Reports not eating breakfast regularly. Reports taking medications at night and not in the morning due to skipping breakfast. She was doing well post seeing a nutritionist, but she reports she got bored with same foods.  She reports having annual visit at GYN this past Friday and they did breast and pap then and they drew labs as well. Needs release so we can see which labs to order.   Review Of Systems  Review of Systems  Constitutional: Negative for chills and fever.  HENT: Negative.   Eyes: Negative.   Respiratory: Negative.  Negative for cough and shortness of breath.   Cardiovascular: Negative.   Gastrointestinal: Negative.   Endocrine: Negative.   Genitourinary: Negative.   Musculoskeletal: Negative.   Skin: Negative.   Allergic/Immunologic: Negative.   Neurological: Negative.   Hematological: Negative.   Psychiatric/Behavioral: Negative.   All other systems reviewed and are negative.   Objective:   PHYSICAL EXAM:  BP 108/80   Pulse (!) 101   Temp 98.9 F (37.2 C) (Oral)   Resp 12   Ht 5\' 4"  (1.626 m)   Wt 208 lb 0.6 oz (94.4 kg)   SpO2 98%   BMI 35.71  kg/m   Physical Exam Vitals signs and nursing note reviewed.  Constitutional:      Appearance: Normal appearance. She is obese.  HENT:     Head: Normocephalic.     Right Ear: Tympanic membrane normal.     Left Ear: Tympanic membrane normal.     Nose: Nose normal.     Mouth/Throat:     Mouth: Mucous membranes are moist.     Pharynx: Oropharynx is clear.  Eyes:     Extraocular Movements: Extraocular movements intact.     Conjunctiva/sclera: Conjunctivae normal.     Pupils: Pupils are equal, round, and reactive to light.  Neck:     Musculoskeletal: Normal range of motion and neck supple.  Cardiovascular:     Rate and Rhythm: Normal rate and regular rhythm.     Pulses: Normal pulses.          Radial pulses are 2+ on the right side and 2+ on the left side.       Dorsalis pedis pulses are 2+ on the right side and 2+ on the left side.     Heart sounds: Normal heart sounds.  Pulmonary:     Effort: Pulmonary effort is normal.     Breath sounds: Normal breath sounds.  Abdominal:     General: Abdomen is flat. Bowel sounds are normal.     Palpations: Abdomen is soft.  Musculoskeletal: Normal range of motion.     Right lower leg: No edema.  Left lower leg: No edema.  Feet:     Right foot:     Skin integrity: Skin integrity normal.     Toenail Condition: Right toenails are normal.     Left foot:     Skin integrity: Skin integrity normal.     Toenail Condition: Left toenails are normal.  Skin:    General: Skin is warm and dry.     Capillary Refill: Capillary refill takes less than 2 seconds.  Neurological:     General: No focal deficit present.     Mental Status: She is alert and oriented to person, place, and time. Mental status is at baseline.     Cranial Nerves: Cranial nerves are intact.     Sensory: Sensation is intact.     Motor: Motor function is intact.     Coordination: Coordination is intact.     Gait: Gait is intact.     Deep Tendon Reflexes: Reflexes are normal  and symmetric.  Psychiatric:        Attention and Perception: Attention and perception normal.        Mood and Affect: Mood normal.        Speech: Speech normal.        Behavior: Behavior normal. Behavior is cooperative.        Thought Content: Thought content normal.        Cognition and Memory: Cognition and memory normal.        Judgment: Judgment normal.     Depression Screening  Depression screen [facility] 2/9 05/25/2019 03/11/2019 10/19/2018 06/30/2018 06/08/2018  Decreased Interest 0 0 0 0 0  Down, Depressed, Hopeless 0 0 0 0 0  PHQ - 2 Score 0 0 0 0 0  Altered sleeping - - - 0 -  Tired, decreased energy - - - 1 -  Change in appetite - - - 0 -  Feeling bad or failure about yourself  - - - 0 -  Trouble concentrating - - - 1 -  Moving slowly or fidgety/restless - - - 0 -  Suicidal thoughts - - - 0 -  PHQ-9 Score - - - 2 -  Difficult doing work/chores - - - Not difficult at all -  Some recent data might be hidden      Assessment & Plan:  1. Annual physical exam As documented above. Will get labs from GYN office. If need will order base of what they took to prevent duplication  Discussed monthly self breast exams and yearly mammograms; at least 30 minutes of aerobic activity at least 5 days/week and weight-bearing exercise 2x/week; proper sunscreen use reviewed; healthy diet, including goals of calcium and vitamin D intake and alcohol recommendations (less than or equal to 1 drink/day) reviewed; regular seatbelt use; changing batteries in smoke detectors.  Immunization recommendations discussed.  Colonoscopy recommendations reviewed.  The patient's weight, height, BMI, and visual acuity have been recorded in the chart.  I have made referrals, counseling, and provided education to the patient based on review of the above and I have provided the patient with a written personalized care plan for preventive services.   2. Morbid obesity (Kaylee Montgomery)  Improved-applauded for this  Kaylee  Montgomery is re-educated about the importance of exercise daily to help with weight management. A minumum of 30 minutes daily is recommended. Additionally, importance of healthy food choices  with portion control discussed. Encouraged to start a food diary, count calories and to consider joining a  support group  if possible. Diet information sheets offered.  Filed Weights   05/25/19 1121  Weight: 208 lb 0.6 oz (94.4 kg)    3. Type 2 diabetes mellitus with other specified complication, without long-term current use of insulin (HCC) Kaylee Montgomery is encouraged to check blood sugar daily as directed. Continue current medications. Is on statin as well. Educated on importance of maintain a well balanced diabetic friendly diet. Written materials provided today. Simple foot exam today She is reminded the importance of maintaining  good blood sugars,  taking medications as directed, daily foot care, annual eye exams. Additionally educated about keeping good control over blood pressure and cholesterol as well.  4. Essential hypertension Kaylee Montgomery is encouraged to maintain a well balanced diet that is low in salt. Controlled, continue current medication regimen.   No refills needed.  She is also reminded that exercise is beneficial for heart health and control of  Blood pressure. 30-60 minutes daily is recommended-walking was suggested.  Kaylee Mayo, NP   05/25/2019

## 2019-06-08 ENCOUNTER — Other Ambulatory Visit (HOSPITAL_COMMUNITY)
Admission: RE | Admit: 2019-06-08 | Discharge: 2019-06-08 | Disposition: A | Discharge status: Home / Self Care | Payer: 59 | Source: Ambulatory Visit | Attending: Family Medicine | Admitting: Family Medicine

## 2019-06-08 ENCOUNTER — Other Ambulatory Visit: Payer: Self-pay

## 2019-06-08 ENCOUNTER — Encounter: Payer: Self-pay | Admitting: Family Medicine

## 2019-06-08 ENCOUNTER — Ambulatory Visit: Payer: 59 | Admitting: Family Medicine

## 2019-06-08 VITALS — BP 120/86 | HR 108 | Temp 98.4°F | Resp 12 | Ht 64.0 in | Wt 204.1 lb

## 2019-06-08 DIAGNOSIS — N760 Acute vaginitis: Secondary | ICD-10-CM | POA: Insufficient documentation

## 2019-06-08 DIAGNOSIS — E1169 Type 2 diabetes mellitus with other specified complication: Secondary | ICD-10-CM | POA: Diagnosis not present

## 2019-06-08 LAB — POCT URINALYSIS DIP (CLINITEK)
Bilirubin, UA: NEGATIVE
Glucose, UA: 500 mg/dL — AB
Ketones, POC UA: NEGATIVE mg/dL
Leukocytes, UA: NEGATIVE
Nitrite, UA: NEGATIVE
POC PROTEIN,UA: NEGATIVE
Spec Grav, UA: 1.02 (ref 1.010–1.025)
Urobilinogen, UA: 0.2 E.U./dL
pH, UA: 6 (ref 5.0–8.0)

## 2019-06-08 LAB — GLUCOSE, POCT (MANUAL RESULT ENTRY): POC Glucose: 367 mg/dl — AB (ref 70–99)

## 2019-06-08 MED ORDER — METFORMIN HCL 1000 MG PO TABS: 1000.0000 mg | ORAL_TABLET | Freq: Two times a day (BID) | ORAL | 2 refills | Status: DC

## 2019-06-08 MED ORDER — FLUCONAZOLE 150 MG PO TABS: 150.0000 mg | ORAL_TABLET | Freq: Once | ORAL | 0 refills | Status: AC

## 2019-06-08 MED ORDER — GLIPIZIDE ER 5 MG PO TB24: 5.0000 mg | ORAL_TABLET | Freq: Every day | ORAL | 1 refills | Status: DC

## 2019-06-08 NOTE — Patient Instructions (Addendum)
    Thank you for coming into the office today. I appreciate the opportunity to provide you with the care for your health and wellness. Today we discussed: yeast infection  Follow Up: 1 week phone visit  No labs today.  Call me tomorrow with your morning blood sugar.  Glipizide change to XL version (5mg )   Metformin increased to 1,000 mg twice daily. Once with breakfast and once with dinner  (might need to change to XL version in the future)  Increase the water to 3-4 bottles daily Walk 30 minutes daily  Please continue to practice social distancing to keep you, your family, and our community safe.  If you must go out, please wear a Mask and practice good handwashing.  [place] YOUR HANDS WELL AND FREQUENTLY. AVOID TOUCHING YOUR FACE, UNLESS YOUR HANDS ARE FRESHLY WASHED.  GET FRESH AIR DAILY. STAY HYDRATED WITH WATER.   It was a pleasure to see you and I look forward to continuing to work together on your health and well-being. Please do not hesitate to call the office if you need care or have questions about your care.  Have a wonderful day and week. With Gratitude, Cherly Beach, DNP, AGNP-BC

## 2019-06-08 NOTE — Addendum Note (Signed)
Addended by: Perlie Mayo on: 06/08/2019 05:14 PM   Modules accepted: Level of Service

## 2019-06-08 NOTE — Progress Notes (Signed)
Subjective:     Patient ID: Kaylee Montgomery, female   DOB: 22-Jan-1981, 38 y.o.   MRN: [id number]  Anny Sayler presents for Diabetes (having high blood sugars. BG this morning was 407) and possible yeast infection (itching started Sat or Sun)  Diabetes follow-up:  Blood sugars at home are running 300-400.  Denies hypoglycemia. Reports polydipsia and polyuria. Last A1c 7.3. Denies non-healing wounds or rashes. Reports possible signs of UTI or yeast infections. Patient reports that she follows a low sugar diet and checks feet regularly without concerns. Reports staying hydrated by drinking water. Has lost 4 pounds. Is concerned about sugar. Reports blurry vision. Reduction in metformin last Fall cause A1c was stable. Reports not exercising like she should.  Itching: Vaginally, started Saturday. Denies intercourse. Denies discharge or odor. Reports increase frequency and urgency. Could be DM related.  Today patient denies signs and symptoms of COVID 19 infection including fever, chills, cough, shortness of breath, and headache.  Past Medical, Surgical, Social History, Allergies, and Medications have been Reviewed.   Past Medical History:  Diagnosis Date  . Breast mass 02/2017  . Dental crown present   . Hypertension    states BP fluctuates; has been on med. x 1 yr.  . Migraines   . 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 Pituitary adenoma ([place])   . Seasonal allergies   . Sleep apnea    no CPAP use in > 6 mos.   Past Surgical History:  Procedure Laterality Date  . BREAST LUMPECTOMY WITH RADIOACTIVE SEED LOCALIZATION Left 03/05/2017   Procedure: LEFT BREAST LUMPECTOMY WITH RADIOACTIVE SEED LOCALIZATION;  Surgeon: Donnie Mesa, MD;  Location: [facility];  Service: General;  Laterality: Left;  . CESAREAN SECTION    . CHOLECYSTECTOMY  08/2017   [facility]  . ESOPHAGOGASTRODUODENOSCOPY  07/21/2017   [facility]: Dr. Delrae Alfred. healthy appearing cyst  cavity without necrotic material that was decreased in size. Removal of stent scheduled for 11/12.   Marland Kitchen EUS  06/19/2017   [facility]: 7 x 7.3 cm cyst with necrotic material noted adjacent to the body of the pancreas. FNA without malignancy.  Marland Kitchen LAPAROSCOPIC GASTRIC SLEEVE RESECTION N/A 02/18/2016   Procedure: LAPAROSCOPIC GASTRIC SLEEVE RESECTION, UPPER ENDO;  Surgeon: Greer Pickerel, MD;  Location: [facility];  Service: General;  Laterality: N/A;  . 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 TONSILLECTOMY  age 72   Social History   Socioeconomic History  . Marital status: Single    Spouse name: Not on file  . Number of children: 1  . Years of education: Not on file  . Highest education level: Not on file  Occupational History  . Occupation: STUDENT   . Occupation: CASEWORKER    Employer: [employer]  Social Needs  . Financial resource strain: Not on file  . Food insecurity    Worry: Not on file    Inability: Not on file  . Transportation needs    Medical: Not on file    Non-medical: Not on file  Tobacco Use  . Smoking status: Never Smoker  . Smokeless tobacco: Never Used  Substance and Sexual Activity  . Alcohol use: No    Alcohol/week: 0.0 standard drinks    Comment: never drank regularly or heavily but NO etoh at all since 01/2017  . Drug use: No  . Sexual activity: Not on file  Lifestyle  .  Physical activity    Days per week: Not on file    Minutes per session: Not on file  . Stress: Not on file  Relationships  . Social Herbalist on phone: Not on file    Gets together: Not on file    Attends religious service: Not on file    Active member of club or organization: Not on file    Attends meetings of clubs or organizations: Not on file    Relationship status: Not on file  . Intimate partner violence    Fear of current or ex partner: Not on file    Emotionally abused: Not on file     Physically abused: Not on file    Forced sexual activity: Not on file  Other Topics Concern  . Not on file  Social History Narrative   Patient is right handed.   Patient drinks 1-2 cups of caffeine daily.    Outpatient Encounter Medications as of 06/08/2019  Medication Sig  . acetaminophen (TYLENOL) 500 MG tablet Take 1,000 mg by mouth every 6 (six) hours as needed.  Marland Kitchen amLODipine (NORVASC) 10 MG tablet TAKE 1 TABLET (10 MG TOTAL) BY MOUTH DAILY  . atorvastatin (LIPITOR) 10 MG tablet Take 1 tablet (10 mg total) by mouth daily.  . blood glucose meter kit and supplies Dispense based on patient and insurance preference. Once daily testing dx e11.9  . cyclobenzaprine (FLEXERIL) 5 MG tablet Take 1 tablet (5 mg total) by mouth at bedtime.  . gabapentin (NEURONTIN) 300 MG capsule Take 1 capsule (300 mg total) by mouth daily.  Marland Kitchen glipiZIDE (GLUCOTROL) 5 MG tablet Take 1 tablet (5 mg total) by mouth daily before breakfast.  . Lancets MISC 1 each by Does not apply route 2 (two) times daily.  . metFORMIN (GLUCOPHAGE) 1000 MG tablet Take 1 tablet (1,000 mg total) by mouth daily with breakfast. Dose reduction effective 07/01/2018  . Metoprolol Tartrate 75 MG TABS Take 75 mg by mouth 2 (two) times daily.  . Vitamin D, Ergocalciferol, (DRISDOL) 1.25 MG (50000 UT) CAPS capsule Take 1 capsule (50,000 Units total) by mouth every 7 (seven) days.   No facility-administered encounter medications on file as of 06/08/2019.    Allergies  Allergen Reactions  . Bromocriptine Mesylate Nausea Only  . Depo-Medrol [Methylprednisolone Acetate] Itching and Rash  . Dilaudid [Hydromorphone Hcl] Itching  . Imitrex [Sumatriptan] Rash    Review of Systems  Constitutional: Negative for chills and fever.  HENT: Negative.   Eyes: Positive for visual disturbance.  Respiratory: Negative.  Negative for cough and shortness of breath.   Cardiovascular: Negative.  Negative for chest pain, palpitations and leg swelling.   Gastrointestinal: Positive for nausea. Negative for vomiting.  Endocrine: Positive for polydipsia and polyuria.  Genitourinary: Positive for frequency, urgency and vaginal discharge.       Itching, elevated BS as well  Musculoskeletal: Negative.   Skin: Negative.   Allergic/Immunologic: Negative.   Neurological: Negative.  Negative for dizziness and headaches.  Hematological: Negative.   Psychiatric/Behavioral: Negative.   All other systems reviewed and are negative.      Objective:     BP 120/86   Pulse (!) 108   Temp 98.4 F (36.9 C) (Oral)   Resp 12   Ht _0  (1.626 m)   Wt 204 lb 1.3 oz (92.6 kg)   SpO2 97%   BMI 35.03 kg/m   Physical Exam Vitals signs and nursing note reviewed.  Constitutional:  Appearance: Normal appearance. She is well-developed and well-groomed. She is obese.  HENT:     Head: Normocephalic and atraumatic.     Right Ear: External ear normal.     Left Ear: External ear normal.     Nose: Nose normal.     Mouth/Throat:     Mouth: Mucous membranes are moist.     Pharynx: Oropharynx is clear.  Eyes:     General:        Right eye: No discharge.        Left eye: No discharge.     Conjunctiva/sclera: Conjunctivae normal.  Neck:     Musculoskeletal: Normal range of motion and neck supple.  Cardiovascular:     Rate and Rhythm: Regular rhythm. Tachycardia present.     Pulses: Normal pulses.     Heart sounds: Normal heart sounds.  Pulmonary:     Effort: Pulmonary effort is normal.     Breath sounds: Normal breath sounds.  Musculoskeletal: Normal range of motion.  Skin:    General: Skin is warm.  Neurological:     General: No focal deficit present.     Mental Status: She is alert and oriented to person, place, and time.  Psychiatric:        Attention and Perception: Attention normal.        Mood and Affect: Mood is depressed. Affect is tearful.        Speech: Speech normal.        Behavior: Behavior normal. Behavior is cooperative.         Thought Content: Thought content normal.        Cognition and Memory: Cognition normal.        Judgment: Judgment normal.        Assessment and Plan   1. Type 2 diabetes mellitus with other specified complication, without long-term current use of insulin (HCC) Quinn Palka is encouraged to check blood sugar daily as directed. Due to uncontrolled levels, change in current medications today. Is on statin as well. Educated on importance of maintain a well balanced diabetic friendly diet. BS in office 367. Discussed with Dr Moshe Cipro, she might need referral to Endo due to previous sx causing issues with pancreas. If BS level and A1c are do not improve will do this in the next few weeks.  - metFORMIN (GLUCOPHAGE) 1000 MG tablet; Take 1 tablet (1,000 mg total) by mouth 2 (two) times daily with a meal. Dose reduction effective 07/01/2018  Dispense: 60 tablet; Refill: 2 - glipiZIDE (GLUCOTROL XL) 5 MG 24 hr tablet; Take 1 tablet (5 mg total) by mouth daily with breakfast.  Dispense: 30 tablet; Refill: 1 - POCT glucose (manual entry)  2. Vaginitis and vulvovaginitis No UTI seen, given elevation in sugar and itching will cover for yeast. And work on getting DM uncontrol.  Will send out urine to make sure clean of bacteria. Elevation in glucose on dip, but not ketones so will not send to ED Encouraged intake of water  - Urine cytology ancillary only - POCT URINALYSIS DIP (CLINITEK) - Urine Culture - fluconazole (DIFLUCAN) 150 MG tablet; Take 1 tablet (150 mg total) by mouth once for 1 dose.  Dispense: 1 tablet; Refill: 0  Follow Up: 1 week 06/16/2019   Perlie Mayo, DNP, AGNP-BC [place], [place], [place] [address] Office Hours: Mon-Thurs 8 am-5 pm; Fri 8 am-12 pm Office Phone:  [phone]  Office Fax:  [phone]

## 2019-06-09 ENCOUNTER — Emergency Department (HOSPITAL_COMMUNITY)
Admission: EM | Admit: 2019-06-09 | Discharge: 2019-06-10 | Disposition: A | Discharge status: Home / Self Care | Payer: 59 | Attending: Emergency Medicine | Admitting: Emergency Medicine

## 2019-06-09 ENCOUNTER — Encounter (HOSPITAL_COMMUNITY): Payer: Self-pay | Admitting: Emergency Medicine

## 2019-06-09 ENCOUNTER — Other Ambulatory Visit (HOSPITAL_COMMUNITY)
Admission: RE | Admit: 2019-06-09 | Discharge: 2019-06-09 | Disposition: A | Discharge status: Home / Self Care | Payer: 59 | Source: Ambulatory Visit

## 2019-06-09 ENCOUNTER — Other Ambulatory Visit: Payer: Self-pay

## 2019-06-09 ENCOUNTER — Telehealth: Payer: Self-pay | Admitting: *Deleted

## 2019-06-09 DIAGNOSIS — Z7984 Long term (current) use of oral hypoglycemic drugs: Secondary | ICD-10-CM | POA: Insufficient documentation

## 2019-06-09 DIAGNOSIS — Z79899 Other long term (current) drug therapy: Secondary | ICD-10-CM | POA: Insufficient documentation

## 2019-06-09 DIAGNOSIS — R739 Hyperglycemia, unspecified: Secondary | ICD-10-CM

## 2019-06-09 DIAGNOSIS — N390 Urinary tract infection, site not specified: Secondary | ICD-10-CM | POA: Diagnosis not present

## 2019-06-09 DIAGNOSIS — E1165 Type 2 diabetes mellitus with hyperglycemia: Secondary | ICD-10-CM | POA: Diagnosis not present

## 2019-06-09 DIAGNOSIS — N760 Acute vaginitis: Secondary | ICD-10-CM | POA: Insufficient documentation

## 2019-06-09 DIAGNOSIS — I10 Essential (primary) hypertension: Secondary | ICD-10-CM | POA: Diagnosis not present

## 2019-06-09 LAB — HEPATIC FUNCTION PANEL
ALT: 48 U/L — ABNORMAL HIGH (ref 0–44)
AST: 39 U/L (ref 15–41)
Albumin: 4.7 g/dL (ref 3.5–5.0)
Alkaline Phosphatase: 63 U/L (ref 38–126)
Bilirubin, Direct: 0.2 mg/dL (ref 0.0–0.2)
Indirect Bilirubin: 1 mg/dL — ABNORMAL HIGH (ref 0.3–0.9)
Total Bilirubin: 1.2 mg/dL (ref 0.3–1.2)
Total Protein: 7.7 g/dL (ref 6.5–8.1)

## 2019-06-09 LAB — CBC
HCT: 41.6 % (ref 36.0–46.0)
Hemoglobin: 13 g/dL (ref 12.0–15.0)
MCH: 24.8 pg — ABNORMAL LOW (ref 26.0–34.0)
MCHC: 31.3 g/dL (ref 30.0–36.0)
MCV: 79.2 fL — ABNORMAL LOW (ref 80.0–100.0)
Platelets: 316 10*3/uL (ref 150–400)
RBC: 5.25 MIL/uL — ABNORMAL HIGH (ref 3.87–5.11)
RDW: 12.4 % (ref 11.5–15.5)
WBC: 7.5 10*3/uL (ref 4.0–10.5)
nRBC: 0 % (ref 0.0–0.2)

## 2019-06-09 LAB — URINALYSIS, ROUTINE W REFLEX MICROSCOPIC
Bilirubin Urine: NEGATIVE
Glucose, UA: >=500 mg/dL — AB
Hgb urine dipstick: NEGATIVE
Ketones, ur: 5 mg/dL — AB
Nitrite: NEGATIVE
Protein, ur: NEGATIVE mg/dL
Specific Gravity, Urine: 1.015 (ref 1.005–1.030)
pH: 6 (ref 5.0–8.0)

## 2019-06-09 LAB — BASIC METABOLIC PANEL
Anion gap: 12 (ref 5–15)
BUN: 9 mg/dL (ref 6–20)
CO2: 23 mmol/L (ref 22–32)
Calcium: 9.2 mg/dL (ref 8.9–10.3)
Chloride: 100 mmol/L (ref 98–111)
Creatinine, Ser: 0.61 mg/dL (ref 0.44–1.00)
GFR calc Af Amer: >60 mL/min (ref >60)
GFR calc non Af Amer: >60 mL/min (ref >60)
Glucose, Bld: 321 mg/dL — ABNORMAL HIGH (ref 70–99)
Potassium: 3.5 mmol/L (ref 3.5–5.1)
Sodium: 135 mmol/L (ref 135–145)

## 2019-06-09 LAB — PREGNANCY, URINE: Preg Test, Ur: NEGATIVE

## 2019-06-09 LAB — LIPASE, BLOOD: Lipase: 43 U/L (ref 11–51)

## 2019-06-09 LAB — CBG MONITORING, ED
Glucose-Capillary: 336 mg/dL — ABNORMAL HIGH (ref 70–99)
Glucose-Capillary: 165 mg/dL — ABNORMAL HIGH (ref 70–99)

## 2019-06-09 MED ORDER — SODIUM CHLORIDE 0.9 % IV BOLUS (SEPSIS)
1000.0000 mL | Freq: Once | INTRAVENOUS | Status: AC
  Administered 2019-06-09: 1000 mL via INTRAVENOUS

## 2019-06-09 MED ORDER — INSULIN ASPART 100 UNIT/ML ~~LOC~~ SOLN
8.0000 [IU] | Freq: Once | SUBCUTANEOUS | Status: AC
  Administered 2019-06-09: 8 [IU] via SUBCUTANEOUS
  Filled 2019-06-09: qty 1

## 2019-06-09 MED ORDER — SODIUM CHLORIDE 0.9 % IV SOLN
1000.0000 mL | INTRAVENOUS | Status: DC
  Administered 2019-06-09: 1000 mL via INTRAVENOUS

## 2019-06-09 NOTE — ED Provider Notes (Signed)
[facility] EMERGENCY DEPARTMENT Provider Note   CSN: [id number] Arrival date & time: 06/09/19  1603     History   Chief Complaint Chief Complaint  Patient presents with  . Hyperglycemia    HPI Kaylee Montgomery is a 38 y.o. female.     The history is provided by the patient. No language interpreter was used.  Hyperglycemia Blood sugar level PTA:  400 Severity:  Moderate Timing:  Constant Progression:  Worsening Chronicity:  Chronic Diabetes status:  Controlled with oral medications Relieved by:  Nothing Ineffective treatments:  None tried Associated symptoms: no abdominal pain   Risk factors: pancreatic disease   Pt reports her Md sent her to the ED for elevated glucose.  Pt ws told she needed to have Iv fluids and insulin.  Pt states her A1c has been high.  Pt has a history of pancreatitis   Past Medical History:  Diagnosis Date  . Breast mass 02/2017  . Dental crown present   . Hypertension    states BP fluctuates; has been on med. x 1 yr.  . Migraines   . 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 Pituitary adenoma ([place])   . Seasonal allergies   . Sleep apnea    no CPAP use in > 6 mos.    Patient Active Problem List   Diagnosis Date Noted  . History of pancreatitis 03/07/2019  . Type 2 diabetes mellitus with other specified complication ([place]) 42/68/3419  . Anemia   . Dense breast tissue 01/13/2017  . Fatty liver 02/18/2016  . 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 Common migraine with intractable migraine 02/23/2015  . Obstructive sleep apnea 10/19/2013  . Essential hypertension 07/17/2008  . 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 PITUITARY ADENOMA 02/17/2008  . Morbid obesity ([place]) 02/17/2008  . Seasonal and perennial allergic rhinitis 02/17/2008  . GYNECOMASTIA 02/17/2008    Past Surgical History:  Procedure Laterality Date  . BREAST LUMPECTOMY WITH RADIOACTIVE SEED LOCALIZATION Left 03/05/2017   Procedure: LEFT BREAST  LUMPECTOMY WITH RADIOACTIVE SEED LOCALIZATION;  Surgeon: Donnie Mesa, MD;  Location: [facility];  Service: General;  Laterality: Left;  . CESAREAN SECTION    . CHOLECYSTECTOMY  08/2017   [facility]  . ESOPHAGOGASTRODUODENOSCOPY  07/21/2017   [facility]: Dr. Delrae Alfred. healthy appearing cyst cavity without necrotic material that was decreased in size. Removal of stent scheduled for 11/12.   Marland Kitchen EUS  06/19/2017   [facility]: 7 x 7.3 cm cyst with necrotic material noted adjacent to the body of the pancreas. FNA without malignancy.  Marland Kitchen LAPAROSCOPIC GASTRIC SLEEVE RESECTION N/A 02/18/2016   Procedure: LAPAROSCOPIC GASTRIC SLEEVE RESECTION, UPPER ENDO;  Surgeon: Greer Pickerel, MD;  Location[facility];  Service: General;  Laterality: N/A;  . 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 TONSILLECTOMY  age 81     OB History    Gravida  1   Para  1   Term  1   Preterm      AB      Living        SAB      TAB      Ectopic      Multiple      Live Births               Home Medications    Prior to Admission medications   Medication Sig Start Date End Date Taking? Authorizing Provider  acetaminophen (TYLENOL) 500 MG tablet Take 1,000 mg by mouth every 6 (six) hours as needed.    [provider]  amLODipine (NORVASC) 10 MG tablet TAKE 1 TABLET (10 MG TOTAL) BY MOUTH DAILY 05/16/19   [name], MD  atorvastatin (LIPITOR) 10 MG tablet Take 1 tablet (10 mg total) by mouth daily. 03/22/19   Perlie Mayo, NP  blood glucose meter kit and supplies Dispense based on patient and insurance preference. Once daily testing dx e11.9 05/03/18   [name], MD  cyclobenzaprine (FLEXERIL) 5 MG tablet Take 1 tablet (5 mg total) by mouth at bedtime. 12/02/18   Kathrynn Ducking, MD  gabapentin (NEURONTIN) 300 MG capsule Take 1 capsule (300 mg total) by mouth daily. 12/02/18   Kathrynn Ducking, MD  glipiZIDE  (GLUCOTROL XL) 5 MG 24 hr tablet Take 1 tablet (5 mg total) by mouth daily with breakfast. 06/08/19   Perlie Mayo, NP  Lancets MISC 1 each by Does not apply route 2 (two) times daily.    [provider]  metFORMIN (GLUCOPHAGE) 1000 MG tablet Take 1 tablet (1,000 mg total) by mouth 2 (two) times daily with a meal. Dose reduction effective 07/01/2018 06/08/19   Perlie Mayo, NP  Metoprolol Tartrate 75 MG TABS Take 75 mg by mouth 2 (two) times daily. 12/02/18   Kathrynn Ducking, MD  Vitamin D, Ergocalciferol, (DRISDOL) 1.25 MG (50000 UT) CAPS capsule Take 1 capsule (50,000 Units total) by mouth every 7 (seven) days. 03/22/19   Perlie Mayo, NP    Family History Family History  Problem Relation Age of Onset  . Heart disease Father        stent  . Migraines Father   . Migraines Son   . Diabetes Maternal Grandfather   . Colon cancer Neg Hx     Social History Social History   Tobacco Use  . Smoking status: Never Smoker  . Smokeless tobacco: Never Used  Substance Use Topics  . Alcohol use: No    Alcohol/week: 0.0 standard drinks    Comment: never drank regularly or heavily but NO etoh at all since 01/2017  . Drug use: No     Allergies   Bromocriptine mesylate, Depo-medrol [methylprednisolone acetate], Dilaudid [hydromorphone hcl], and Imitrex [sumatriptan]   Review of Systems Review of Systems  Gastrointestinal: Negative for abdominal pain.  All other systems reviewed and are negative.    Physical Exam Updated Vital Signs BP 124/89 (BP Location: Right Arm)   Pulse (!) 101   Temp 98.5 F (36.9 C) (Oral)   Resp 16   SpO2 100%   Physical Exam Vitals signs and nursing note reviewed.  Constitutional:      Appearance: She is well-developed.  HENT:     Head: Normocephalic.     Mouth/Throat:     Mouth: Mucous membranes are moist.  Eyes:     Pupils: Pupils are equal, round, and reactive to light.  Neck:     Musculoskeletal: Normal range of motion.   Cardiovascular:     Rate and Rhythm: Normal rate.  Pulmonary:     Effort: Pulmonary effort is normal.  Abdominal:     General: There is no distension.  Musculoskeletal: Normal range of motion.  Skin:    General: Skin is warm.  Neurological:     Mental Status: She is alert and oriented to person, place, and time.  Psychiatric:        Mood and  Affect: Mood normal.      ED Treatments / Results  Labs (all labs ordered are listed, but only abnormal results are displayed) Labs Reviewed  BASIC METABOLIC PANEL - Abnormal; Notable for the following components:      Result Value   Glucose, Bld 321 (*)    All other components within normal limits  CBC - Abnormal; Notable for the following components:   RBC 5.25 (*)    MCV 79.2 (*)    MCH 24.8 (*)    All other components within normal limits  CBG MONITORING, ED - Abnormal; Notable for the following components:   Glucose-Capillary 336 (*)    All other components within normal limits  URINALYSIS, ROUTINE W REFLEX MICROSCOPIC  CBG MONITORING, ED  POC URINE PREG, ED    EKG None  Radiology No results found.  Procedures Procedures (including critical care time)  Medications Ordered in ED Medications  sodium chloride 0.9 % bolus 1,000 mL (has no administration in time range)    Followed by  sodium chloride 0.9 % bolus 1,000 mL (has no administration in time range)    Followed by  0.9 %  sodium chloride infusion (has no administration in time range)  insulin aspart (novoLOG) injection 8 Units (has no administration in time range)     Initial Impression / Assessment and Plan / ED Course  I have reviewed the triage vital signs and the nursing notes.  Pertinent labs & imaging results that were available during my care of the patient were reviewed by me and considered in my medical decision making (see chart for details).        MDM  Pt given IV fluids and subq insulin.  Pt feels better.  Pt counseled on diabetes  management and diet.   Final Clinical Impressions(s) / ED Diagnoses   Final diagnoses:  Hyperglycemia  Urinary tract infection without hematuria, site unspecified    ED Discharge Orders         Ordered    cephALEXin (KEFLEX) 500 MG capsule  4 times daily     06/10/19 0016        An After Visit Summary was printed and given to the patient.    Fransico Meadow, [place] 06/10/19 1719    Margette Fast, MD 06/15/19 [phone]

## 2019-06-09 NOTE — ED Triage Notes (Signed)
Patient reports elevated blood sugars and A1C. Sent by PCP for glycemic control.

## 2019-06-09 NOTE — ED Triage Notes (Signed)
Patient reports polydipsia, polyuria, and elevated blood sugars. Has been on Metformin with dosage increase yesterday. Reports blurry vision and yeast infection.

## 2019-06-09 NOTE — Telephone Encounter (Signed)
Pt wanted to let Jarrett Soho know her A1C numbers today. It was 437 both times she has checked it.

## 2019-06-10 ENCOUNTER — Encounter: Payer: Self-pay | Admitting: Family Medicine

## 2019-06-10 LAB — URINE CULTURE: Culture: NO GROWTH

## 2019-06-10 MED ORDER — CEPHALEXIN 500 MG PO CAPS
500.0000 mg | ORAL_CAPSULE | Freq: Once | ORAL | Status: AC
  Administered 2019-06-10: 500 mg via ORAL
  Filled 2019-06-10: qty 1

## 2019-06-10 MED ORDER — CEPHALEXIN 500 MG PO CAPS: 500.0000 mg | ORAL_CAPSULE | Freq: Four times a day (QID) | ORAL | 0 refills | Status: DC

## 2019-06-10 NOTE — Discharge Instructions (Addendum)
Continue diabetes medication.  Take antibiotic as directed.  See your Physician for recheck

## 2019-06-11 LAB — URINE CYTOLOGY ANCILLARY ONLY
Bacterial vaginitis: NEGATIVE
Candida vaginitis: NEGATIVE

## 2019-06-13 ENCOUNTER — Ambulatory Visit (INDEPENDENT_AMBULATORY_CARE_PROVIDER_SITE_OTHER): Payer: 59 | Admitting: Family Medicine

## 2019-06-13 ENCOUNTER — Other Ambulatory Visit: Payer: Self-pay | Admitting: Family Medicine

## 2019-06-13 ENCOUNTER — Other Ambulatory Visit: Payer: Self-pay

## 2019-06-13 ENCOUNTER — Telehealth: Payer: Self-pay

## 2019-06-13 ENCOUNTER — Encounter: Payer: Self-pay | Admitting: Family Medicine

## 2019-06-13 VITALS — BP 118/80 | HR 95 | Temp 97.3°F | Resp 15 | Ht 64.0 in | Wt 203.0 lb

## 2019-06-13 DIAGNOSIS — E1169 Type 2 diabetes mellitus with other specified complication: Secondary | ICD-10-CM | POA: Diagnosis not present

## 2019-06-13 DIAGNOSIS — Z1231 Encounter for screening mammogram for malignant neoplasm of breast: Secondary | ICD-10-CM

## 2019-06-13 DIAGNOSIS — I10 Essential (primary) hypertension: Secondary | ICD-10-CM

## 2019-06-13 DIAGNOSIS — E1165 Type 2 diabetes mellitus with hyperglycemia: Secondary | ICD-10-CM | POA: Diagnosis not present

## 2019-06-13 DIAGNOSIS — Z2820 Immunization not carried out because of patient decision for unspecified reason: Secondary | ICD-10-CM

## 2019-06-13 DIAGNOSIS — E782 Mixed hyperlipidemia: Secondary | ICD-10-CM

## 2019-06-13 LAB — GLUCOSE, POCT (MANUAL RESULT ENTRY): POC Glucose: 244 mg/dl — AB (ref 70–99)

## 2019-06-13 NOTE — Telephone Encounter (Signed)
-----   Message from [name], MD sent at 06/13/2019  9:15 AM EDT ----- Regarding: pls order hBA1c for pt to do today preferably, also IO am trying to work her in

## 2019-06-13 NOTE — Patient Instructions (Addendum)
F/U with mD in January , call iof yopu need me before  Please cancel the other 2 appointments in office for this year  Please sched  [facility], may message pt appointment info  It is important that you exercise regularly at least 30 minutes 5 times a week. If you develop chest pain, have severe difficulty breathing, or feel very tired, stop exercising immediately and seek medical attention    Keep appt with Endo in am  Fasting lipid and HBA1C tomorrow morning  Pneumonia vaccine is indicated/ recommended, as important  As yearly flu  It is important that you exercise regularly at least 30 minutes 5 times a week. If you develop chest pain, have severe difficulty breathing, or feel very tired, stop exercising immediately and seek medical attention

## 2019-06-14 ENCOUNTER — Encounter: Payer: Self-pay | Admitting: "Endocrinology

## 2019-06-14 ENCOUNTER — Ambulatory Visit: Payer: 59 | Admitting: "Endocrinology

## 2019-06-14 VITALS — BP 114/78 | HR 99 | Ht 64.0 in | Wt 205.0 lb

## 2019-06-14 DIAGNOSIS — E1165 Type 2 diabetes mellitus with hyperglycemia: Secondary | ICD-10-CM | POA: Insufficient documentation

## 2019-06-14 DIAGNOSIS — E782 Mixed hyperlipidemia: Secondary | ICD-10-CM | POA: Insufficient documentation

## 2019-06-14 DIAGNOSIS — I10 Essential (primary) hypertension: Secondary | ICD-10-CM | POA: Diagnosis not present

## 2019-06-14 MED ORDER — ONETOUCH VERIO VI STRP: ORAL_STRIP | 2 refills | Status: DC

## 2019-06-14 MED ORDER — ONETOUCH VERIO W/DEVICE KIT: 1.0000 | PACK | 0 refills | Status: DC | PRN

## 2019-06-14 NOTE — Patient Instructions (Signed)

## 2019-06-14 NOTE — Progress Notes (Signed)
Endocrinology Consult Note       06/14/2019, 5:03 PM   Subjective:    Patient ID: Kaylee Montgomery, female    DOB: [birth year].  Kaylee Montgomery is being seen in consultation for management of currently uncontrolled symptomatic diabetes requested by  [name], MD.   Past Medical History:  Diagnosis Date  . Breast mass 02/2017  . Dental crown present   . Hypertension    states BP fluctuates; has been on med. x 1 yr.  . Migraines   . 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 Pituitary adenoma ([place])   . Seasonal allergies   . Sleep apnea    no CPAP use in > 6 mos.    Past Surgical History:  Procedure Laterality Date  . BREAST LUMPECTOMY WITH RADIOACTIVE SEED LOCALIZATION Left 03/05/2017   Procedure: LEFT BREAST LUMPECTOMY WITH RADIOACTIVE SEED LOCALIZATION;  Surgeon: Donnie Mesa, MD;  Location: [facility];  Service: General;  Laterality: Left;  . CESAREAN SECTION    . CHOLECYSTECTOMY  08/2017   [facility]  . ESOPHAGOGASTRODUODENOSCOPY  07/21/2017   [facility]: Dr. Delrae Alfred. healthy appearing cyst cavity without necrotic material that was decreased in size. Removal of stent scheduled for 11/12.   Marland Kitchen EUS  06/19/2017   [facility]: 7 x 7.3 cm cyst with necrotic material noted adjacent to the body of the pancreas. FNA without malignancy.  Marland Kitchen LAPAROSCOPIC GASTRIC SLEEVE RESECTION N/A 02/18/2016   Procedure: LAPAROSCOPIC GASTRIC SLEEVE RESECTION, UPPER ENDO;  Surgeon: Greer Pickerel, MD;  Location: [facility];  Service: General;  Laterality: N/A;  . 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 TONSILLECTOMY  age 65    Social History   Socioeconomic History  . Marital status: Single    Spouse name: Not on file  . Number of children: 1  . Years of education: Not on file  . Highest education level: Not on  file  Occupational History  . Occupation: STUDENT   . Occupation: CASEWORKER    Employer: [employer]  Social Needs  . Financial resource strain: Not on file  . Food insecurity    Worry: Not on file    Inability: Not on file  . Transportation needs    Medical: Not on file    Non-medical: Not on file  Tobacco Use  . Smoking status: Never Smoker  . Smokeless tobacco: Never Used  Substance and Sexual Activity  . Alcohol use: No    Alcohol/week: 0.0 standard drinks    Comment: never drank regularly or heavily but NO etoh at all since 01/2017  . Drug use: No  . Sexual activity: Not on file  Lifestyle  . Physical activity    Days per week: Not on file    Minutes per session: Not on file  . Stress: Not on file  Relationships  . Social Herbalist on phone: Not on file    Gets together: Not on file    Attends religious service: Not on file    Active member of club  or organization: Not on file    Attends meetings of clubs or organizations: Not on file    Relationship status: Not on file  Other Topics Concern  . Not on file  Social History Narrative   Patient is right handed.   Patient drinks 1-2 cups of caffeine daily.    Family History  Problem Relation Age of Onset  . Heart disease Father        stent  . Migraines Father   . Migraines Son   . Diabetes Maternal Grandfather   . Colon cancer Neg Hx     Outpatient Encounter Medications as of 06/14/2019  Medication Sig  . acetaminophen (TYLENOL) 500 MG tablet Take 1,000 mg by mouth every 6 (six) hours as needed.  Marland Kitchen amLODipine (NORVASC) 10 MG tablet TAKE 1 TABLET (10 MG TOTAL) BY MOUTH DAILY  . atorvastatin (LIPITOR) 10 MG tablet Take 1 tablet (10 mg total) by mouth daily.  . blood glucose meter kit and supplies Dispense based on patient and insurance preference. Once daily testing dx e11.9  . Blood Glucose Monitoring Suppl (ONETOUCH VERIO) w/Device KIT 1 each by Does not apply route as needed.  .  cyclobenzaprine (FLEXERIL) 5 MG tablet Take 1 tablet (5 mg total) by mouth at bedtime.  . gabapentin (NEURONTIN) 300 MG capsule Take 1 capsule (300 mg total) by mouth daily.  Marland Kitchen glipiZIDE (GLUCOTROL XL) 5 MG 24 hr tablet Take 1 tablet (5 mg total) by mouth daily with breakfast.  . glucose blood (ONETOUCH VERIO) test strip Use as instructed  . Lancets MISC 1 each by Does not apply route 2 (two) times daily.  . metFORMIN (GLUCOPHAGE) 1000 MG tablet Take 1 tablet (1,000 mg total) by mouth 2 (two) times daily with a meal. Dose reduction effective 07/01/2018  . Metoprolol Tartrate 75 MG TABS Take 75 mg by mouth 2 (two) times daily.  . Vitamin D, Ergocalciferol, (DRISDOL) 1.25 MG (50000 UT) CAPS capsule Take 1 capsule (50,000 Units total) by mouth every 7 (seven) days.   No facility-administered encounter medications on file as of 06/14/2019.     ALLERGIES: Allergies  Allergen Reactions  . Bromocriptine Mesylate Nausea Only  . Depo-Medrol [Methylprednisolone Acetate] Itching and Rash  . Dilaudid [Hydromorphone Hcl] Itching  . Imitrex [Sumatriptan] Rash    VACCINATION STATUS: Immunization History  Administered Date(s) Administered  . Influenza Split 07/28/2013, 07/20/2014, 08/06/2015  . Td 10/20/1997    Diabetes She presents for her initial diabetic visit. She has type 2 diabetes mellitus. Onset time: She was diagnosed at approximate age of 67 years. Her disease course has been worsening. There are no hypoglycemic associated symptoms. Pertinent negatives for hypoglycemia include no confusion, headaches, pallor or seizures. Associated symptoms include fatigue, polydipsia and polyuria. Pertinent negatives for diabetes include no chest pain and no polyphagia. There are no hypoglycemic complications. There are no diabetic complications. Risk factors for coronary artery disease include hypertension, family history, obesity and sedentary lifestyle. Current diabetic treatments: She is currently on  metformin 1000 mg p.o. twice daily and glipizide 5 mg p.o. daily. Her weight is fluctuating minimally (He has history of morbid obesity status post sleeve gastrectomy remote past.). She is following a generally unhealthy diet. When asked about meal planning, she reported none. She has had a previous visit with a dietitian. She rarely participates in exercise. (She did not bring any logs nor meter to review.  Her A1c in May was 7.7%.  He had A1c drawn this morning but not  ready for review.) An ACE inhibitor/angiotensin II receptor blocker is not being taken. Eye exam is current.  Hypertension This is a chronic problem. The current episode started more than 1 year ago. The problem is controlled. Pertinent negatives include no chest pain, headaches, palpitations or shortness of breath. Risk factors for coronary artery disease include diabetes mellitus, family history, sedentary lifestyle and obesity. Past treatments include calcium channel blockers and beta blockers.  Hyperlipidemia This is a chronic problem. The current episode started more than 1 year ago. Exacerbating diseases include diabetes and obesity. Pertinent negatives include no chest pain, myalgias or shortness of breath. Risk factors for coronary artery disease include dyslipidemia, diabetes mellitus, hypertension, obesity, a sedentary lifestyle and family history.     Review of Systems  Constitutional: Positive for fatigue. Negative for chills, fever and unexpected weight change.  HENT: Negative for trouble swallowing and voice change.   Eyes: Negative for visual disturbance.  Respiratory: Negative for cough, shortness of breath and wheezing.   Cardiovascular: Negative for chest pain, palpitations and leg swelling.  Gastrointestinal: Negative for diarrhea, nausea and vomiting.  Endocrine: Positive for polydipsia and polyuria. Negative for cold intolerance, heat intolerance and polyphagia.  Musculoskeletal: Negative for arthralgias and  myalgias.  Skin: Negative for color change, pallor, rash and wound.  Neurological: Negative for seizures and headaches.  Psychiatric/Behavioral: Negative for confusion and suicidal ideas.    Objective:    BP 114/78   Pulse 99   Ht _0  (1.626 m)   Wt 205 lb (93 kg)   BMI 35.19 kg/m   Wt Readings from Last 3 Encounters:  06/14/19 205 lb (93 kg)  06/13/19 203 lb (92.1 kg)  06/08/19 204 lb 1.3 oz (92.6 kg)     Physical Exam Constitutional:      Appearance: She is well-developed.  HENT:     Head: Normocephalic and atraumatic.  Neck:     Musculoskeletal: Normal range of motion and neck supple.     Thyroid: No thyromegaly.     Trachea: No tracheal deviation.  Cardiovascular:     Rate and Rhythm: Normal rate.  Pulmonary:     Effort: Pulmonary effort is normal.  Abdominal:     Tenderness: There is no abdominal tenderness. There is no guarding.  Musculoskeletal: Normal range of motion.  Skin:    General: Skin is warm and dry.     Coloration: Skin is not pale.     Findings: No erythema or rash.  Neurological:     Mental Status: She is alert and oriented to person, place, and time.     Cranial Nerves: No cranial nerve deficit.     Coordination: Coordination normal.     Deep Tendon Reflexes: Reflexes are normal and symmetric.  Psychiatric:        Judgment: Judgment normal.       CMP ( most recent) CMP     Component Value Date/Time   NA 135 06/09/2019 1727   K 3.5 06/09/2019 1727   CL 100 06/09/2019 1727   CO2 23 06/09/2019 1727   GLUCOSE 321 (H) 06/09/2019 1727   BUN 9 06/09/2019 1727   CREATININE 0.61 06/09/2019 1727   CREATININE 0.56 03/11/2019 1013   CALCIUM 9.2 06/09/2019 1727   PROT 7.7 06/09/2019 1727   ALBUMIN 4.7 06/09/2019 1727   AST 39 06/09/2019 1727   ALT 48 (H) 06/09/2019 1727   ALKPHOS 63 06/09/2019 1727   BILITOT 1.2 06/09/2019 1727   GFRNONAA >60 06/09/2019 1727  GFRNONAA 117 03/11/2019 1013   GFRAA >60 06/09/2019 1727   GFRAA 136  03/11/2019 1013     Diabetic Labs (most recent): Lab Results  Component Value Date   HGBA1C 7.7 (H) 03/11/2019   HGBA1C 6.3 (H) 06/30/2018   HGBA1C 10.1 (H) 03/11/2018     Lipid Panel ( most recent) Lipid Panel     Component Value Date/Time   CHOL 203 (H) 03/11/2019 1013   TRIG 149 03/11/2019 1013   HDL 40 (L) 03/11/2019 1013   CHOLHDL 5.1 (H) 03/11/2019 1013   VLDL 20 04/13/2017 1630   LDLCALC 135 (H) 03/11/2019 1013      Lab Results  Component Value Date   TSH 1.60 03/11/2019   TSH 1.526 04/12/2017   TSH 1.744 01/10/2015   TSH 1.000 04/26/2013   TSH 1.520 03/15/2011   TSH 2.292 05/03/2008           Assessment & Plan:   There are no diagnoses linked to this encounter.  - Kaylee Montgomery has currently uncontrolled symptomatic type 2 DM since  38 years of age,  with most recent A1c of 7.7 %. Recent labs reviewed. - I had a long discussion with her about the progressive nature of diabetes and the pathology behind its complications. -her diabetes is complicated by obesity which required sleeve gastrectomy, sedentary life and she remains at a high risk for more acute and chronic complications which include CAD, CVA, CKD, retinopathy, and neuropathy. These are all discussed in detail with her.  - I have counseled her on diet management and weight loss, by adopting a carbohydrate restricted/protein rich diet. - she admits that there is a room for improvement in her food and drink choices. - Suggestion is made for her to avoid simple carbohydrates  from her diet including Cakes, Sweet Desserts, Ice Cream, Soda (diet and regular), Sweet Tea, Candies, Chips, Cookies, Store Bought Juices, Alcohol in Excess of  1-2 drinks a day, Artificial Sweeteners,  Coffee Creamer, and "Sugar-free" Products. This will help patient to have more stable blood glucose profile and potentially avoid unintended weight gain.  - I encouraged her to switch to  unprocessed or minimally processed  complex starch and increased protein intake (animal or plant source), fruits, and vegetables.  - she is advised to stick to a routine mealtimes to eat 3 meals  a day and avoid unnecessary snacks ( to snack only to correct hypoglycemia).   - she will be scheduled with Jearld Fenton, RDN, CDE for diabetes education.  - I have approached her with the following individualized plan to manage  her diabetes and patient agrees:   - she had A1c drawn this morning, not available to review.  If her A1c is greater than 9%, she will be considered for basal insulin.    -She is approached to start monitoring blood glucose 4 times a day   -before meals and at bedtime, and return in 1 week for reevaluation.  - she is encouraged to call clinic for blood glucose levels less than 70 or above 300 mg /dl. - she is advised to continue metformin 1000 mg p.o. twice daily and glipizide 5 mg p.o. daily at breakfast, therapeutically suitable for patient .  - she is not a candidate for incretin therapy due to history of pancreatitis.   - Patient specific target  A1c;  LDL, HDL, Triglycerides, and  Waist Circumference were discussed in detail.  2) Blood Pressure /Hypertension:  her blood pressure is  controlled  to target.   she is advised to continue her current medications including metoprolol 75 mg p.o. twice daily, amlodipine 10 mg p.o. daily with breakfast . 3) Lipids/Hyperlipidemia:   Review of her recent lipid panel showed uncontrolled  LDL at 136 .  she  Is advised to continue atorvastatin 10 mg p.o. nightly.  Side effects and precautions discussed with her.  4)  Weight/Diet:  Body mass index is 35.19 kg/m.  -  clearly complicating her diabetes care.  I discussed with her the fact that loss of 5 - 10% of her  current body weight will have the most impact on her diabetes management.  CDE Consult will be initiated . Exercise, and detailed carbohydrates information provided  -  detailed on discharge  instructions.  5) Chronic Care/Health Maintenance:  -she  is on  Statin medications and  is encouraged to initiate and continue to follow up with Ophthalmology, Dentist,  Podiatrist at least yearly or according to recommendations, and advised to  stay away from smoking. I have recommended yearly flu vaccine and pneumonia vaccine at least every 5 years; moderate intensity exercise for up to 150 minutes weekly; and  sleep for at least 7 hours a day.  - she is  advised to maintain close follow up with [name], MD for primary care needs, as well as her other providers for optimal and coordinated care.  - Time spent with the patient: 45 minutes, of which >50% was spent in obtaining information about her symptoms, reviewing her previous labs/studies, evaluations, and treatments, counseling her about her currently uncontrolled type 2 diabetes, hyperlipidemia, hypertension, and developing plans for long term treatment based on the latest standards of care/guidelines.  Please refer to " Patient Self Inventory" in the Media  tab for reviewed elements of pertinent patient history.  Kaylee Montgomery participated in the discussions, expressed understanding, and voiced agreement with the above plans.  All questions were answered to her satisfaction. she is encouraged to contact clinic should she have any questions or concerns prior to her return visit.  Follow up plan: - Return in about 1 week (around 06/21/2019), or Phone, for Follow up with Meter and Logs Only - no Labs.  Glade Lloyd, MD [facility] [address] [place], [place] [address] Phone: [phone]  Fax: [phone]    06/14/2019, 5:03 PM  This note was partially dictated with voice recognition software. Similar sounding words can be transcribed inadequately or may not  be corrected upon review.

## 2019-06-15 ENCOUNTER — Encounter: Payer: Self-pay | Admitting: Family Medicine

## 2019-06-15 ENCOUNTER — Telehealth: Payer: Self-pay | Admitting: "Endocrinology

## 2019-06-15 LAB — LIPID PANEL
Cholesterol: 91 mg/dL (ref <200)
HDL: 26 mg/dL — ABNORMAL LOW (ref >=50)
LDL Cholesterol (Calc): 39 mg/dL (calc)
Non-HDL Cholesterol (Calc): 65 mg/dL (calc) (ref <130)
Total CHOL/HDL Ratio: 3.5 (calc) (ref <5.0)
Triglycerides: 180 mg/dL — ABNORMAL HIGH (ref <150)

## 2019-06-15 LAB — HEMOGLOBIN A1C
Hgb A1c MFr Bld: 11.9 % of total Hgb — ABNORMAL HIGH (ref <5.7)
Mean Plasma Glucose: 295 (calc)
eAG (mmol/L): 16.3 (calc)

## 2019-06-15 MED ORDER — ONETOUCH VERIO VI STRP: ORAL_STRIP | 5 refills | Status: AC

## 2019-06-15 MED ORDER — LANCETS MISC: 1.0000 | Freq: Two times a day (BID) | 5 refills | Status: AC

## 2019-06-15 MED ORDER — ONETOUCH VERIO W/DEVICE KIT: 1.0000 | PACK | Freq: Two times a day (BID) | 0 refills | Status: DC

## 2019-06-15 MED FILL — VIT D2 1.25 MG (50,000 UNIT: 1.25 MG | 28 days supply | Qty: 4 | Fill #2

## 2019-06-15 MED FILL — AMLODIPINE BESYLATE 10 MG T: 10 | 30 days supply | Qty: 30 | Fill #1

## 2019-06-15 MED FILL — GABAPENTIN 300 MG CAPSULE: 300 | 31 days supply | Qty: 31 | Fill #2

## 2019-06-15 MED FILL — CYCLOBENZAPRINE 5 MG TABLET: 5 | 30 days supply | Qty: 30 | Fill #2

## 2019-06-15 NOTE — Progress Notes (Signed)
No bacteria was seen on culture.

## 2019-06-15 NOTE — Telephone Encounter (Signed)
New rx sent

## 2019-06-15 NOTE — Progress Notes (Signed)
No yeast was seen, nor BV. Continue good hygiene.

## 2019-06-15 NOTE — Telephone Encounter (Signed)
[facility] pharmacy called and needs a new RX for meter and test strips that specifically say how many times she is testing etc. They can not take " as needed "

## 2019-06-16 ENCOUNTER — Ambulatory Visit: Payer: 59 | Admitting: Family Medicine

## 2019-06-19 ENCOUNTER — Encounter: Payer: Self-pay | Admitting: Family Medicine

## 2019-06-19 DIAGNOSIS — Z2820 Immunization not carried out because of patient decision for unspecified reason: Secondary | ICD-10-CM | POA: Insufficient documentation

## 2019-06-19 NOTE — Progress Notes (Signed)
Kaylee Montgomery     MRN: [id number]      DOB: [birth year]   HPI Kaylee Montgomery is here for follow up of uncontrolled diabetes, fasting blood sugars are 200 and above C/o  polyuria, polydipsia, blurred vision , denies  hypoglycemic episodes. Refusing both influenza and pneumonia vaccines  ROS Denies recent fever or chills.c/o fatigue and blurred vision Denies sinus pressure, nasal congestion, ear pain or sore throat. Denies chest congestion, productive cough or wheezing. Denies chest pains, palpitations and leg swelling Denies abdominal pain, nausea, vomiting,diarrhea or constipation.   Denies dysuria, frequency, hesitancy or incontinence. Denies joint pain, swelling and limitation in mobility. Denies headaches, seizures, numbness, or tingling.  Denies skin break down or rash.   PE  BP 118/80   Pulse 95   Temp (!) 97.3 F (36.3 C) (Temporal)   Resp 15   Ht 5\' 4"  (1.626 m)   Wt 203 lb (92.1 kg)   SpO2 99%   BMI 34.84 kg/m   Patient alert and oriented and in no cardiopulmonary distress.  HEENT: No facial asymmetry, EOMI,   oropharynx pink and moist.  Neck supple no JVD, no mass.  Chest: Clear to auscultation bilaterally.  CVS: S1, S2 no murmurs, no S3.Regular rate.  ABD: Soft non tender.   Ext: No edema  MS: Adequate ROM spine, shoulders, hips and knees.  Skin: Intact, no ulcerations or rash noted.  Psych: Good eye contact, normal affect. Memory intact not anxious or depressed appearing.  CNS: CN 2-12 intact, power,  normal throughout.no focal deficits noted.   Assessment & Plan  Uncontrolled type 2 diabetes mellitus with hyperglycemia (HCC) Marked deterioration, will liely need insulin, has appt with Endo in am Kaylee Montgomery is reminded of the importance of commitment to daily physical activity for 30 minutes or more, as able and the need to limit carbohydrate intake to 30 to 60 grams per meal to help with blood sugar control.   The need to take medication as  prescribed, test blood sugar as directed, and to call between visits if there is a concern that blood sugar is uncontrolled is also discussed.   Kaylee Montgomery is reminded of the importance of daily foot exam, annual eye examination, and good blood sugar, blood pressure and cholesterol control.  Diabetic Labs Latest Ref Rng & Units 06/14/2019 06/09/2019 03/11/2019 06/30/2018 04/01/2018  HbA1c <5.7 % of total Hgb 11.9(H) - 7.7(H) 6.3(H) -  Microalbumin mg/dL - - 5.6 - 69.4(H)  Micro/Creat Ratio <30 mcg/mg creat - - 48(H) - 25.9  Chol <200 mg/dL 91 - 203(H) - -  HDL > OR = 50 mg/dL 26(L) - 40(L) - -  Calc LDL mg/dL (calc) 39 - 135(H) - -  Triglycerides <150 mg/dL 180(H) - 149 - -  Creatinine 0.44 - 1.00 mg/dL - 0.61 0.56 0.68 -   BP/Weight 06/14/2019 06/13/2019 06/09/2019 06/08/2019 05/25/2019 03/11/2019 0000000  Systolic BP 99991111 123456 123456 123456 [id number] 123456 123456  Diastolic BP 78 80 76 86 80 88 80  Wt. (Lbs) 205 203 - 204.08 208.04 215 209.31  BMI 35.19 34.84 - 35.03 35.71 36.9 35.93   Foot/eye exam completion dates 05/25/2019 03/17/2018  Foot Form Completion Done Done        Essential hypertension, benign Controlled, no change in medication DASH diet and commitment to daily physical activity for a minimum of 30 minutes discussed and encouraged, as a part of hypertension management. The importance of attaining a healthy weight is also discussed.  BP/Weight  06/14/2019 06/13/2019 06/09/2019 06/08/2019 05/25/2019 03/11/2019 0000000  Systolic BP 99991111 123456 123456 123456 [id number] 123456 123456  Diastolic BP 78 80 76 86 80 88 80  Wt. (Lbs) 205 203 - 204.08 208.04 215 209.31  BMI 35.19 34.84 - 35.03 35.71 36.9 35.93       Morbid obesity (HCC) Obesity associated with hypertension ad diabetes  Patient re-educated about  the importance of commitment to a  minimum of 150 minutes of exercise per week as able.  The importance of healthy food choices with portion control discussed, as well as eating regularly and within a 12 hour  window most days. The need to choose "clean , green" food 50 to 75% of the time is discussed, as well as to make water the primary drink and set a goal of 64 ounces water daily.    Weight /BMI 06/14/2019 06/13/2019 06/08/2019  WEIGHT 205 lb 203 lb 204 lb 1.3 oz  HEIGHT 5\' 4"  5\' 4"  5\' 4"   BMI 35.19 kg/m2 34.84 kg/m2 35.03 kg/m2      Mixed hyperlipidemia Hyperlipidemia:Low fat diet discussed and encouraged.   Lipid Panel  Lab Results  Component Value Date   CHOL 91 06/14/2019   HDL 26 (L) 06/14/2019   LDLCALC 39 06/14/2019   TRIG 180 (H) 06/14/2019   CHOLHDL 3.5 06/14/2019   Needs to commit to regular exercise and reduce fat intake    Vaccine refused by patient Re educated and currently refusing both influenza and pneumonia vaccines

## 2019-06-19 NOTE — Assessment & Plan Note (Signed)
Marked deterioration, will liely need insulin, has appt with Endo in am Kaylee Montgomery is reminded of the importance of commitment to daily physical activity for 30 minutes or more, as able and the need to limit carbohydrate intake to 30 to 60 grams per meal to help with blood sugar control.   The need to take medication as prescribed, test blood sugar as directed, and to call between visits if there is a concern that blood sugar is uncontrolled is also discussed.   Kaylee Montgomery is reminded of the importance of daily foot exam, annual eye examination, and good blood sugar, blood pressure and cholesterol control.  Diabetic Labs Latest Ref Rng & Units 06/14/2019 06/09/2019 03/11/2019 06/30/2018 04/01/2018  HbA1c <5.7 % of total Hgb 11.9(H) - 7.7(H) 6.3(H) -  Microalbumin mg/dL - - 5.6 - 69.4(H)  Micro/Creat Ratio <30 mcg/mg creat - - 48(H) - 25.9  Chol <200 mg/dL 91 - 203(H) - -  HDL > OR = 50 mg/dL 26(L) - 40(L) - -  Calc LDL mg/dL (calc) 39 - 135(H) - -  Triglycerides <150 mg/dL 180(H) - 149 - -  Creatinine 0.44 - 1.00 mg/dL - 0.61 0.56 0.68 -   BP/Weight 06/14/2019 06/13/2019 06/09/2019 06/08/2019 05/25/2019 03/11/2019 0000000  Systolic BP 99991111 123456 123456 123456 [id number] 123456 123456  Diastolic BP 78 80 76 86 80 88 80  Wt. (Lbs) 205 203 - 204.08 208.04 215 209.31  BMI 35.19 34.84 - 35.03 35.71 36.9 35.93   Foot/eye exam completion dates 05/25/2019 03/17/2018  Foot Form Completion Done Done

## 2019-06-19 NOTE — Assessment & Plan Note (Signed)
Obesity associated with hypertension ad diabetes  Patient re-educated about  the importance of commitment to a  minimum of 150 minutes of exercise per week as able.  The importance of healthy food choices with portion control discussed, as well as eating regularly and within a 12 hour window most days. The need to choose "clean , green" food 50 to 75% of the time is discussed, as well as to make water the primary drink and set a goal of 64 ounces water daily.    Weight /BMI 06/14/2019 06/13/2019 06/08/2019  WEIGHT 205 lb 203 lb 204 lb 1.3 oz  HEIGHT 5\' 4"  5\' 4"  5\' 4"   BMI 35.19 kg/m2 34.84 kg/m2 35.03 kg/m2

## 2019-06-19 NOTE — Assessment & Plan Note (Signed)
Controlled, no change in medication DASH diet and commitment to daily physical activity for a minimum of 30 minutes discussed and encouraged, as a part of hypertension management. The importance of attaining a healthy weight is also discussed.  BP/Weight 06/14/2019 06/13/2019 06/09/2019 06/08/2019 05/25/2019 03/11/2019 0000000  Systolic BP 99991111 123456 123456 123456 [id number] 123456 123456  Diastolic BP 78 80 76 86 80 88 80  Wt. (Lbs) 205 203 - 204.08 208.04 215 209.31  BMI 35.19 34.84 - 35.03 35.71 36.9 35.93

## 2019-06-19 NOTE — Assessment & Plan Note (Signed)
Hyperlipidemia:Low fat diet discussed and encouraged.   Lipid Panel  Lab Results  Component Value Date   CHOL 91 06/14/2019   HDL 26 (L) 06/14/2019   LDLCALC 39 06/14/2019   TRIG 180 (H) 06/14/2019   CHOLHDL 3.5 06/14/2019   Needs to commit to regular exercise and reduce fat intake

## 2019-06-19 NOTE — Assessment & Plan Note (Signed)
Re educated and currently refusing both influenza and pneumonia vaccines

## 2019-06-24 ENCOUNTER — Telehealth: Payer: Self-pay | Admitting: *Deleted

## 2019-06-24 ENCOUNTER — Ambulatory Visit (INDEPENDENT_AMBULATORY_CARE_PROVIDER_SITE_OTHER): Payer: 59 | Admitting: "Endocrinology

## 2019-06-24 ENCOUNTER — Encounter: Payer: Self-pay | Admitting: "Endocrinology

## 2019-06-24 ENCOUNTER — Other Ambulatory Visit: Payer: Self-pay | Admitting: *Deleted

## 2019-06-24 ENCOUNTER — Other Ambulatory Visit: Payer: Self-pay

## 2019-06-24 DIAGNOSIS — I10 Essential (primary) hypertension: Secondary | ICD-10-CM | POA: Diagnosis not present

## 2019-06-24 DIAGNOSIS — E1169 Type 2 diabetes mellitus with other specified complication: Secondary | ICD-10-CM

## 2019-06-24 DIAGNOSIS — E782 Mixed hyperlipidemia: Secondary | ICD-10-CM

## 2019-06-24 DIAGNOSIS — Z20822 Contact with and (suspected) exposure to covid-19: Secondary | ICD-10-CM

## 2019-06-24 MED ORDER — GLIPIZIDE ER 10 MG PO TB24: 10.0000 mg | ORAL_TABLET | Freq: Every day | ORAL | 3 refills | Status: DC

## 2019-06-24 MED FILL — glipiZIDE ER 10 MG TB24: 10 | 30 days supply | Qty: 30 | Fill #0

## 2019-06-24 NOTE — Progress Notes (Signed)
06/24/2019, 2:08 PM                                                    Endocrinology Telehealth Visit Follow up Note -During COVID -19 Pandemic  This visit type was conducted due to national recommendations for restrictions regarding the COVID-19 Pandemic  in an effort to limit this patient's exposure and mitigate transmission of the corona virus.  Due to her co-morbid illnesses, Kaylee Montgomery is at  moderate to high risk for complications without adequate follow up.  This format is felt to be most appropriate for her at this time.  I connected with this patient on 06/24/2019   by telephone and verified that I am speaking with the correct person using two identifiers. Kaylee Montgomery, Dec 16, 1980. she has verbally consented to this visit. All issues noted in this document were discussed and addressed. The format was not optimal for physical exam.    Subjective:    Patient ID: Kaylee Montgomery, female    DOB: Oct 28, 1980.  Kaylee Montgomery is being engaged in telehealth via telephone follow-up after she was seen in consultation for management of currently uncontrolled symptomatic diabetes requested by  [name], MD.   Past Medical History:  Diagnosis Date  . Breast mass 02/2017  . Dental crown present   . Hypertension    states BP fluctuates; has been on med. x 1 yr.  . Migraines   . 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 Pituitary adenoma ([place])   . Seasonal allergies   . Sleep apnea    no CPAP use in > 6 mos.    Past Surgical History:  Procedure Laterality Date  . BREAST LUMPECTOMY WITH RADIOACTIVE SEED LOCALIZATION Left 03/05/2017   Procedure: LEFT BREAST LUMPECTOMY WITH RADIOACTIVE SEED LOCALIZATION;  Surgeon: Donnie Mesa, MD;  Location: [facility];  Service: General;  Laterality: Left;  . CESAREAN SECTION    . CHOLECYSTECTOMY  08/2017   [facility]   . ESOPHAGOGASTRODUODENOSCOPY  07/21/2017   [facility]: Dr. Delrae Alfred. healthy appearing cyst cavity without necrotic material that was decreased in size. Removal of stent scheduled for 11/12.   Marland Kitchen EUS  06/19/2017   [facility]: 7 x 7.3 cm cyst with necrotic material noted adjacent to the body of the pancreas. FNA without malignancy.  Marland Kitchen LAPAROSCOPIC GASTRIC SLEEVE RESECTION N/A 02/18/2016   Procedure: LAPAROSCOPIC GASTRIC SLEEVE RESECTION, UPPER ENDO;  Surgeon: Greer Pickerel, MD;  Location: [facility];  Service: General;  Laterality: N/A;  . 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 TONSILLECTOMY  age 23    Social History   Socioeconomic History  . Marital status: Single    Spouse name: Not on file  . Number of children: 1  . Years of education: Not on file  . Highest education level: Not on file  Occupational History  . Occupation: STUDENT   . Occupation: CASEWORKER  Employer: [employer]  Social Needs  . Financial resource strain: Not on file  . Food insecurity    Worry: Not on file    Inability: Not on file  . Transportation needs    Medical: Not on file    Non-medical: Not on file  Tobacco Use  . Smoking status: Never Smoker  . Smokeless tobacco: Never Used  Substance and Sexual Activity  . Alcohol use: No    Alcohol/week: 0.0 standard drinks    Comment: never drank regularly or heavily but NO etoh at all since 01/2017  . Drug use: No  . Sexual activity: Not on file  Lifestyle  . Physical activity    Days per week: Not on file    Minutes per session: Not on file  . Stress: Not on file  Relationships  . Social Herbalist on phone: Not on file    Gets together: Not on file    Attends religious service: Not on file    Active member of club or organization: Not on file    Attends meetings of clubs or organizations: Not on file    Relationship status: Not on file  Other Topics Concern  .  Not on file  Social History Narrative   Patient is right handed.   Patient drinks 1-2 cups of caffeine daily.    Family History  Problem Relation Age of Onset  . Heart disease Father        stent  . Migraines Father   . Migraines Son   . Diabetes Maternal Grandfather   . Colon cancer Neg Hx     Outpatient Encounter Medications as of 06/24/2019  Medication Sig  . acetaminophen (TYLENOL) 500 MG tablet Take 1,000 mg by mouth every 6 (six) hours as needed.  Marland Kitchen amLODipine (NORVASC) 10 MG tablet TAKE 1 TABLET (10 MG TOTAL) BY MOUTH DAILY  . atorvastatin (LIPITOR) 10 MG tablet Take 1 tablet (10 mg total) by mouth daily.  . blood glucose meter kit and supplies Dispense based on patient and insurance preference. Once daily testing dx e11.9  . Blood Glucose Monitoring Suppl (ONETOUCH VERIO) w/Device KIT 1 each by Does not apply route 2 (two) times daily. E11.65  . cyclobenzaprine (FLEXERIL) 5 MG tablet Take 1 tablet (5 mg total) by mouth at bedtime.  . gabapentin (NEURONTIN) 300 MG capsule Take 1 capsule (300 mg total) by mouth daily.  Marland Kitchen glipiZIDE (GLUCOTROL XL) 10 MG 24 hr tablet Take 1 tablet (10 mg total) by mouth daily with breakfast.  . glucose blood (ONETOUCH VERIO) test strip Use as instructed bid. E11.65  . Lancets MISC 1 each by Does not apply route 2 (two) times daily.  . metFORMIN (GLUCOPHAGE) 1000 MG tablet Take 1 tablet (1,000 mg total) by mouth 2 (two) times daily with a meal. Dose reduction effective 07/01/2018  . Metoprolol Tartrate 75 MG TABS Take 75 mg by mouth 2 (two) times daily.  . Vitamin D, Ergocalciferol, (DRISDOL) 1.25 MG (50000 UT) CAPS capsule Take 1 capsule (50,000 Units total) by mouth every 7 (seven) days.  . [DISCONTINUED] glipiZIDE (GLUCOTROL XL) 5 MG 24 hr tablet Take 1 tablet (5 mg total) by mouth daily with breakfast.   No facility-administered encounter medications on file as of 06/24/2019.     ALLERGIES: Allergies  Allergen Reactions  . Bromocriptine  Mesylate Nausea Only  . Depo-Medrol [Methylprednisolone Acetate] Itching and Rash  . Dilaudid [Hydromorphone Hcl] Itching  . Imitrex [  Sumatriptan] Rash    VACCINATION STATUS: Immunization History  Administered Date(s) Administered  . Influenza Split 07/28/2013, 07/20/2014, 08/06/2015  . Td 10/20/1997    Diabetes She presents for her follow-up diabetic visit. She has type 2 diabetes mellitus. Onset time: She was diagnosed at approximate age of 58 years. Her disease course has been improving. There are no hypoglycemic associated symptoms. Pertinent negatives for hypoglycemia include no confusion, headaches, pallor or seizures. Associated symptoms include fatigue, polydipsia and polyuria. Pertinent negatives for diabetes include no chest pain and no polyphagia. There are no hypoglycemic complications. Symptoms are improving. There are no diabetic complications. Risk factors for coronary artery disease include hypertension, family history, obesity and sedentary lifestyle. Current diabetic treatments: She is currently on metformin 1000 mg p.o. twice daily and glipizide 5 mg p.o. daily. Weight trend: He has history of morbid obesity status post sleeve gastrectomy remote past. She is following a generally unhealthy diet. When asked about meal planning, she reported none. She has had a previous visit with a dietitian. She rarely participates in exercise. Her breakfast blood glucose range is generally 140-180 mg/dl. Her lunch blood glucose range is generally 140-180 mg/dl. Her dinner blood glucose range is generally 140-180 mg/dl. Her bedtime blood glucose range is generally 140-180 mg/dl. Her overall blood glucose range is 140-180 mg/dl. (She is reporting significantly improved glycemic profile since her last visit: Fasting 117-225, prelunch 142-212, presupper 167-236, bedtime 190-173.  This is despite her recent A1c of 11.9%.  ) An ACE inhibitor/angiotensin II receptor blocker is not being taken. Eye exam is  current.  Hypertension This is a chronic problem. The current episode started more than 1 year ago. The problem is controlled. Pertinent negatives include no chest pain, headaches, palpitations or shortness of breath. Risk factors for coronary artery disease include diabetes mellitus, family history, sedentary lifestyle and obesity. Past treatments include calcium channel blockers and beta blockers.  Hyperlipidemia This is a chronic problem. The current episode started more than 1 year ago. Exacerbating diseases include diabetes and obesity. Pertinent negatives include no chest pain, myalgias or shortness of breath. Risk factors for coronary artery disease include dyslipidemia, diabetes mellitus, hypertension, obesity, a sedentary lifestyle and family history.    Review of systems: Limited as above.    Objective:    There were no vitals taken for this visit.  Wt Readings from Last 3 Encounters:  06/14/19 205 lb (93 kg)  06/13/19 203 lb (92.1 kg)  06/08/19 204 lb 1.3 oz (92.6 kg)     cMP     Component Value Date/Time   NA 135 06/09/2019 1727   K 3.5 06/09/2019 1727   CL 100 06/09/2019 1727   CO2 23 06/09/2019 1727   GLUCOSE 321 (H) 06/09/2019 1727   BUN 9 06/09/2019 1727   CREATININE 0.61 06/09/2019 1727   CREATININE 0.56 03/11/2019 1013   CALCIUM 9.2 06/09/2019 1727   PROT 7.7 06/09/2019 1727   ALBUMIN 4.7 06/09/2019 1727   AST 39 06/09/2019 1727   ALT 48 (H) 06/09/2019 1727   ALKPHOS 63 06/09/2019 1727   BILITOT 1.2 06/09/2019 1727   GFRNONAA >60 06/09/2019 1727   GFRNONAA 117 03/11/2019 1013   GFRAA >60 06/09/2019 1727   GFRAA 136 03/11/2019 1013     Diabetic Labs (most recent): Lab Results  Component Value Date   HGBA1C 11.9 (H) 06/14/2019   HGBA1C 7.7 (H) 03/11/2019   HGBA1C 6.3 (H) 06/30/2018     Lipid Panel ( most recent) Lipid Panel  Component Value Date/Time   CHOL 91 06/14/2019 0748   TRIG 180 (H) 06/14/2019 0748   HDL 26 (L) 06/14/2019 0748    CHOLHDL 3.5 06/14/2019 0748   VLDL 20 04/13/2017 1630   LDLCALC 39 06/14/2019 0748      Lab Results  Component Value Date   TSH 1.60 03/11/2019   TSH 1.526 04/12/2017   TSH 1.744 01/10/2015   TSH 1.000 04/26/2013   TSH 1.520 03/15/2011   TSH 2.292 05/03/2008       Assessment & Plan:   1.  Uncontrolled type 2 diabetes  - Kaylee Montgomery has currently uncontrolled symptomatic type 2 DM since  38 years of age,  with most recent A1c of 11.9 %.  She is reporting significantly improved glycemic profile since her last visit.  -Her recent labs were discussed with her.   - I had a long discussion with her about the progressive nature of diabetes and the pathology behind its complications. -her diabetes is complicated by obesity which required sleeve gastrectomy, sedentary life and she remains at a high risk for more acute and chronic complications which include CAD, CVA, CKD, retinopathy, and neuropathy. These are all discussed in detail with her.  - I have counseled her on diet management and weight loss, by adopting a carbohydrate restricted/protein rich diet.  - she  admits there is a room for improvement in her diet and drink choices. -  Suggestion is made for her to avoid simple carbohydrates  from her diet including Cakes, Sweet Desserts / Pastries, Ice Cream, Soda (diet and regular), Sweet Tea, Candies, Chips, Cookies, Sweet Pastries,  Store Bought Juices, Alcohol in Excess of  1-2 drinks a day, Artificial Sweeteners, Coffee Creamer, and "Sugar-free" Products. This will help patient to have stable blood glucose profile and potentially avoid unintended weight gain.   - I encouraged her to switch to  unprocessed or minimally processed complex starch and increased protein intake (animal or plant source), fruits, and vegetables.  - she is advised to stick to a routine mealtimes to eat 3 meals  a day and avoid unnecessary snacks ( to snack only to correct hypoglycemia).   - I have  approached her with the following individualized plan to manage  her diabetes and patient agrees:   -She is reporting near target glycemic profile on her current regimen, approach to stay on metformin 1000 mg p.o. twice daily, increase glipizide to 10 mg p.o. daily at breakfast.   -She will be monitoring blood glucose  twice a day-daily before breakfast and at bedtime.  - she is encouraged to call clinic for blood glucose levels less than 70 or above 200 mg /dl.  - she is not a candidate for incretin therapy due to history of pancreatitis.   - Patient specific target  A1c;  LDL, HDL, Triglycerides, and  Waist Circumference were discussed in detail.  2) Blood Pressure /Hypertension:   she is advised to home monitor blood pressure and report if > 140/90 on 2 separate readings.    she is advised to continue her current medications including metoprolol 75 mg p.o. twice daily, amlodipine 10 mg p.o. daily with breakfast . 3) Lipids/Hyperlipidemia:   Review of her recent lipid panel showed uncontrolled  LDL at 136 .  she  Is advised to continue atorvastatin 10 mg p.o. nightly.  Side effects and precautions discussed with her.  4)  Weight/Diet: BMI 35.-  clearly complicating her diabetes care.  I discussed with her the  fact that loss of 5 - 10% of her  current body weight will have the most impact on her diabetes management.  CDE Consult will be initiated . Exercise, and detailed carbohydrates information provided  -  detailed on discharge instructions.  5) Chronic Care/Health Maintenance:  -she  is on  Statin medications and  is encouraged to initiate and continue to follow up with Ophthalmology, Dentist,  Podiatrist at least yearly or according to recommendations, and advised to  stay away from smoking. I have recommended yearly flu vaccine and pneumonia vaccine at least every 5 years; moderate intensity exercise for up to 150 minutes weekly; and  sleep for at least 7 hours a day.  - she is  advised  to maintain close follow up with [name], MD for primary care needs, as well as her other providers for optimal and coordinated care.  - Patient Care Time Today:  25 min, of which >50% was spent in  counseling and the rest reviewing her  current and  previous labs/studies, previous treatments, her blood glucose readings, and medications' doses and developing a plan for long-term care based on the latest recommendations for standards of care.   Kaylee Montgomery participated in the discussions, expressed understanding, and voiced agreement with the above plans.  All questions were answered to her satisfaction. she is encouraged to contact clinic should she have any questions or concerns prior to her return visit.   Follow up plan: - Return in about 3 months (around 09/23/2019) for Bring Meter and Logs- A1c in Office.  Glade Lloyd, MD [facility] [address] [place], [place] [address] Phone: [phone]  Fax: [phone]    06/24/2019, 2:08 PM  This note was partially dictated with voice recognition software. Similar sounding words can be transcribed inadequately or may not  be corrected upon review.

## 2019-06-24 NOTE — Telephone Encounter (Signed)
Pt called wanted to let dr simpson know she thought she had corona virus. Advised pt to go to urgent care and do drive thru testing and to touch base with [place] next week. She states she would go have test done.

## 2019-06-26 LAB — NOVEL CORONAVIRUS, NAA: SARS-CoV-2, NAA: DETECTED — AB

## 2019-06-28 ENCOUNTER — Telehealth: Payer: Self-pay | Admitting: Family Medicine

## 2019-06-28 DIAGNOSIS — U071 COVID-19: Secondary | ICD-10-CM

## 2019-06-28 NOTE — Telephone Encounter (Signed)
I spoke with pt, confirming that she is aware of covid positive status Denies any fever, has had chills  And dry cough, no sob. SON WHO ;LIVES WITH HER TESTED POSITIVE FIRST , and they are self quarantined at home States blood sugar greatly improved. General covid 19 info provided. Needs rept test in 2 weeks and tele visit before releaste to work with [facility], pleaSE SEND REQUEST FOR COVID 19 TEST TO BE DONE 9/21 /2020, also needs tele visit with me 07/13/2019 ( before she can return to work ,and I am hoping that test results will be in by then) Also she has agreed to do daily log in electronically re temp once I enter her dx 9 please open the portal if this is needed ( let me know so I can understand the process, thanks)

## 2019-06-29 NOTE — Telephone Encounter (Signed)
Pt has appt 07-13-19 was told to follow up 9-21 with another covid test

## 2019-06-29 NOTE — Telephone Encounter (Signed)
Can you let her know to repeat the covid test on 9/21 and schedule her ov for 9/23 with dr simpson. Thanks

## 2019-06-29 NOTE — Telephone Encounter (Signed)
Noted- see previous note

## 2019-07-03 ENCOUNTER — Encounter (HOSPITAL_COMMUNITY): Payer: Self-pay | Admitting: Emergency Medicine

## 2019-07-03 ENCOUNTER — Emergency Department (HOSPITAL_COMMUNITY): Payer: 59

## 2019-07-03 ENCOUNTER — Emergency Department (HOSPITAL_COMMUNITY)
Admission: EM | Admit: 2019-07-03 | Discharge: 2019-07-03 | Disposition: A | Discharge status: Home / Self Care | Payer: 59 | Attending: Emergency Medicine | Admitting: Emergency Medicine

## 2019-07-03 ENCOUNTER — Other Ambulatory Visit: Payer: Self-pay

## 2019-07-03 DIAGNOSIS — I10 Essential (primary) hypertension: Secondary | ICD-10-CM | POA: Insufficient documentation

## 2019-07-03 DIAGNOSIS — J189 Pneumonia, unspecified organism: Secondary | ICD-10-CM | POA: Diagnosis not present

## 2019-07-03 DIAGNOSIS — U071 COVID-19: Secondary | ICD-10-CM

## 2019-07-03 DIAGNOSIS — Z7984 Long term (current) use of oral hypoglycemic drugs: Secondary | ICD-10-CM | POA: Insufficient documentation

## 2019-07-03 DIAGNOSIS — Z79899 Other long term (current) drug therapy: Secondary | ICD-10-CM | POA: Diagnosis not present

## 2019-07-03 DIAGNOSIS — E119 Type 2 diabetes mellitus without complications: Secondary | ICD-10-CM | POA: Diagnosis not present

## 2019-07-03 DIAGNOSIS — J1282 Pneumonia due to coronavirus disease 2019: Secondary | ICD-10-CM

## 2019-07-03 DIAGNOSIS — R0789 Other chest pain: Secondary | ICD-10-CM | POA: Diagnosis present

## 2019-07-03 LAB — CBC
HCT: 38.8 % (ref 36.0–46.0)
Hemoglobin: 12.3 g/dL (ref 12.0–15.0)
MCH: 24.8 pg — ABNORMAL LOW (ref 26.0–34.0)
MCHC: 31.7 g/dL (ref 30.0–36.0)
MCV: 78.2 fL — ABNORMAL LOW (ref 80.0–100.0)
Platelets: 313 10*3/uL (ref 150–400)
RBC: 4.96 MIL/uL (ref 3.87–5.11)
RDW: 12.8 % (ref 11.5–15.5)
WBC: 3.1 10*3/uL — ABNORMAL LOW (ref 4.0–10.5)
nRBC: 0 % (ref 0.0–0.2)

## 2019-07-03 LAB — BASIC METABOLIC PANEL
Anion gap: 12 (ref 5–15)
BUN: 6 mg/dL (ref 6–20)
CO2: 21 mmol/L — ABNORMAL LOW (ref 22–32)
Calcium: 8.9 mg/dL (ref 8.9–10.3)
Chloride: 104 mmol/L (ref 98–111)
Creatinine, Ser: 0.49 mg/dL (ref 0.44–1.00)
GFR calc Af Amer: >60 mL/min (ref >60)
GFR calc non Af Amer: >60 mL/min (ref >60)
Glucose, Bld: 118 mg/dL — ABNORMAL HIGH (ref 70–99)
Potassium: 3.4 mmol/L — ABNORMAL LOW (ref 3.5–5.1)
Sodium: 137 mmol/L (ref 135–145)

## 2019-07-03 LAB — SARS CORONAVIRUS 2 BY RT PCR (HOSPITAL ORDER, PERFORMED IN ~~LOC~~ HOSPITAL LAB): SARS Coronavirus 2: POSITIVE — AB

## 2019-07-03 LAB — TROPONIN I (HIGH SENSITIVITY)
Troponin I (High Sensitivity): 2 ng/L (ref <18)
Troponin I (High Sensitivity): <2 ng/L (ref <18)

## 2019-07-03 LAB — D-DIMER, QUANTITATIVE: D-Dimer, Quant: 0.73 ug/mL-FEU — ABNORMAL HIGH (ref 0.00–0.50)

## 2019-07-03 MED ORDER — IOHEXOL 350 MG/ML SOLN: 100.0000 mL | Freq: Once | INTRAVENOUS | Status: DC | PRN

## 2019-07-03 MED ORDER — SODIUM CHLORIDE 0.9% FLUSH: 3.0000 mL | Freq: Once | INTRAVENOUS | Status: DC

## 2019-07-03 MED ORDER — DM-GUAIFENESIN ER 30-600 MG PO TB12: 1.0000 | ORAL_TABLET | Freq: Two times a day (BID) | ORAL | 1 refills | Status: DC

## 2019-07-03 MED ORDER — PREDNISONE 10 MG PO TABS: 40.0000 mg | ORAL_TABLET | Freq: Every day | ORAL | 0 refills | Status: DC

## 2019-07-03 MED ORDER — SODIUM CHLORIDE 0.9 % IV SOLN: INTRAVENOUS | Status: DC

## 2019-07-03 MED ORDER — PREDNISONE 50 MG PO TABS
60.0000 mg | ORAL_TABLET | Freq: Once | ORAL | Status: AC
  Administered 2019-07-03: 60 mg via ORAL
  Filled 2019-07-03: qty 1

## 2019-07-03 NOTE — ED Triage Notes (Signed)
Patient c/o mid-syternal chest pain with no radiation. Patient states tested 2 weeks ago for covid after son tested positive, in which she tested positive. Patient states had chills, cough, diarrhea, fever, and fatigue but got better before chest pain started. Patient reports productive cough with thick clear sputum.

## 2019-07-03 NOTE — Discharge Instructions (Addendum)
Chest x-ray consistent with developing pneumonia in the lower lung fields.  This could be consistent with a COVID-19 infection.  Her oxygen saturations have been fine.  As we discussed there was some concern about possible blood clots in the lungs since she had a slightly elevated d-dimer but it was abnormal.  However COVID-19 can do that on its own.  There is no leg swelling.  Oxygen levels have been very good.  As you know we wanted to do the CT Angie of her chest but nursing is unable to get adequate IV.  An ultrasound IV placement cannot be done because of your COVID infection.  We will go ahead and give a trial of prednisone to help with the symptoms.  And Mucinex DM to help with the cough.  Blood sugars will go up with this.  So monitor them carefully.  Return for any worsening with your breathing or feeling air hungry.

## 2019-07-03 NOTE — ED Notes (Signed)
CRITICAL VALUE ALERT  Critical Value:  Covid Positive  Date & Time Notied:  07/03/2019. 1904  Provider Notified: Dr. Rogene Houston  Orders Received/Actions taken: none

## 2019-07-03 NOTE — ED Provider Notes (Addendum)
[facility] Provider Note   CSN: [id number] Arrival date & time: 07/03/19  1427     History   Chief Complaint Chief Complaint  Patient presents with  . Chest Pain    HPI Kaylee Montgomery is a 38 y.o. female.     Patient with known COVID 19 infection.  Patient tested 2 weeks ago after son tested positive.  When she tested positive.  States she has had chills cough diarrhea fever and fatigue but got better before chest pain started.  Patient reports cough with some clear sputum and chest discomfort with coughing and feeling slightly short of breath.  But not having significant air hunger.  No leg swelling.  No chest pain without coughing.  Patient is concerned about the chest pain.     Past Medical History:  Diagnosis Date  . Breast mass 02/2017  . Dental crown present   . Hypertension    states BP fluctuates; has been on med. x 1 yr.  . Migraines   . 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 Pituitary adenoma ([place])   . Seasonal allergies   . Sleep apnea    no CPAP use in > 6 mos.    Patient Active Problem List   Diagnosis Date Noted  . Vaccine refused by patient 06/19/2019  . Uncontrolled type 2 diabetes mellitus with hyperglycemia ([place]) 06/14/2019  . Mixed hyperlipidemia 06/14/2019  . History of pancreatitis 03/07/2019  . Diabetes mellitus ([place]) 03/20/2018  . Anemia   . Dense breast tissue 01/13/2017  . Fatty liver 02/18/2016  . 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 Common migraine with intractable migraine 02/23/2015  . Obstructive sleep apnea 10/19/2013  . Essential hypertension, benign 07/17/2008  . 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 PITUITARY ADENOMA 02/17/2008  . Morbid obesity ([place]) 02/17/2008  . Seasonal and perennial allergic rhinitis 02/17/2008  . GYNECOMASTIA 02/17/2008    Past Surgical History:  Procedure Laterality Date  . BREAST LUMPECTOMY WITH RADIOACTIVE SEED LOCALIZATION Left 03/05/2017   Procedure: LEFT BREAST LUMPECTOMY WITH RADIOACTIVE SEED LOCALIZATION;  Surgeon: Donnie Mesa, MD;  Location: [facility];  Service: General;  Laterality: Left;  . CESAREAN SECTION    . CHOLECYSTECTOMY  08/2017   [facility]  . ESOPHAGOGASTRODUODENOSCOPY  07/21/2017   [facility]: Dr. Delrae Alfred. healthy appearing cyst cavity without necrotic material that was decreased in size. Removal of stent scheduled for 11/12.   Marland Kitchen EUS  06/19/2017   [facility]: 7 x 7.3 cm cyst with necrotic material noted adjacent to the body of the pancreas. FNA without malignancy.  Marland Kitchen LAPAROSCOPIC GASTRIC SLEEVE RESECTION N/A 02/18/2016   Procedure: LAPAROSCOPIC GASTRIC SLEEVE RESECTION, UPPER ENDO;  Surgeon: Greer Pickerel, MD;  Location: [facility];  Service: General;  Laterality: N/A;  . 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 TONSILLECTOMY  age 35     OB History    Gravida  1   Para  1   Term  1   Preterm      AB      Living        SAB      TAB      Ectopic      Multiple      Live Births               Home Medications    Prior to Admission medications   Medication Sig Start Date End Date  Taking? Authorizing Provider  acetaminophen (TYLENOL) 500 MG tablet Take 1,000 mg by mouth every 6 (six) hours as needed for mild pain or moderate pain.    Yes [provider]  amLODipine (NORVASC) 10 MG tablet TAKE 1 TABLET (10 MG TOTAL) BY MOUTH DAILY 05/16/19  Yes [name], MD  atorvastatin (LIPITOR) 10 MG tablet Take 1 tablet (10 mg total) by mouth daily. 03/22/19  Yes Perlie Mayo, NP  benzonatate (TESSALON) 200 MG capsule Take 200 mg by mouth 3 (three) times daily as needed for cough.   Yes [provider]  cyclobenzaprine (FLEXERIL) 5 MG tablet Take 1 tablet (5 mg total) by mouth at bedtime. 12/02/18  Yes Kathrynn Ducking, MD  gabapentin (NEURONTIN) 300 MG capsule Take 1 capsule (300 mg total) by mouth  daily. 12/02/18  Yes Kathrynn Ducking, MD  glipiZIDE (GLUCOTROL XL) 10 MG 24 hr tablet Take 1 tablet (10 mg total) by mouth daily with breakfast. 06/24/19  Yes Nida, Marella Chimes, MD  metFORMIN (GLUCOPHAGE) 1000 MG tablet Take 1 tablet (1,000 mg total) by mouth 2 (two) times daily with a meal. Dose reduction effective 07/01/2018 06/08/19  Yes Perlie Mayo, NP  Metoprolol Tartrate 75 MG TABS Take 75 mg by mouth 2 (two) times daily. 12/02/18  Yes Kathrynn Ducking, MD  Multiple Vitamins-Minerals (EMERGEN-C IMMUNE PLUS PO) Take 1 Package by mouth daily.   Yes [provider]  promethazine-dextromethorphan (PROMETHAZINE-DM) 6.25-15 MG/5ML syrup Take 5 mLs by mouth 4 (four) times daily as needed for cough.   Yes [provider]  Vitamin D, Ergocalciferol, (DRISDOL) 1.25 MG (50000 UT) CAPS capsule Take 1 capsule (50,000 Units total) by mouth every 7 (seven) days. Patient taking differently: Take 50,000 Units by mouth every Tuesday.  03/22/19  Yes Perlie Mayo, NP  Blood Glucose Monitoring Suppl (ONETOUCH VERIO) w/Device KIT 1 each by Does not apply route 2 (two) times daily. E11.65 Patient not taking: Reported on 07/03/2019 06/15/19   Cassandria Anger, MD  dextromethorphan-guaiFENesin [facility] DM) 30-600 MG 12hr tablet Take 1 tablet by mouth 2 (two) times daily. 07/03/19   Fredia Sorrow, MD  glucose blood (ONETOUCH VERIO) test strip Use as instructed bid. E11.65 Patient not taking: Reported on 07/03/2019 06/15/19   Cassandria Anger, MD  Lancets MISC 1 each by Does not apply route 2 (two) times daily. Patient not taking: Reported on 07/03/2019 06/15/19   Cassandria Anger, MD  predniSONE (DELTASONE) 10 MG tablet Take 4 tablets (40 mg total) by mouth daily. 07/03/19   Fredia Sorrow, MD    Family History Family History  Problem Relation Age of Onset  . Heart disease Father        stent  . Migraines Father   . Migraines Son   . Diabetes Maternal Grandfather   . Colon  cancer Neg Hx     Social History Social History   Tobacco Use  . Smoking status: Never Smoker  . Smokeless tobacco: Never Used  Substance Use Topics  . Alcohol use: No    Alcohol/week: 0.0 standard drinks    Comment: never drank regularly or heavily but NO etoh at all since 01/2017  . Drug use: No     Allergies   Bromocriptine mesylate, Depo-medrol [methylprednisolone acetate], Dilaudid [hydromorphone hcl], and Imitrex [sumatriptan]   Review of Systems Review of Systems  Constitutional: Positive for chills, fatigue and fever.  HENT: Negative for rhinorrhea and sore throat.   Eyes: Negative  for visual disturbance.  Respiratory: Positive for cough and shortness of breath.   Cardiovascular: Positive for chest pain. Negative for leg swelling.  Gastrointestinal: Positive for diarrhea. Negative for abdominal pain, nausea and vomiting.  Genitourinary: Negative for dysuria.  Musculoskeletal: Negative for back pain and neck pain.  Skin: Negative for rash.  Neurological: Negative for dizziness, light-headedness and headaches.  Hematological: Does not bruise/bleed easily.  Psychiatric/Behavioral: Negative for confusion.     Physical Exam Updated Vital Signs BP 121/89   Pulse (!) 102   Temp 98.8 F (37.1 C) (Oral)   Resp 19   Ht 1.626 m (_0 )   Wt 92.5 kg   SpO2 100%   BMI 35.02 kg/m   Physical Exam Vitals signs and nursing note reviewed.  Constitutional:      General: She is not in acute distress.    Appearance: She is well-developed.  HENT:     Head: Normocephalic and atraumatic.  Eyes:     Extraocular Movements: Extraocular movements intact.     Conjunctiva/sclera: Conjunctivae normal.     Pupils: Pupils are equal, round, and reactive to light.  Neck:     Musculoskeletal: Normal range of motion and neck supple.  Cardiovascular:     Rate and Rhythm: Normal rate and regular rhythm.     Heart sounds: No murmur.  Pulmonary:     Effort: Pulmonary effort is  normal. No respiratory distress.     Breath sounds: Normal breath sounds.  Abdominal:     Palpations: Abdomen is soft.     Tenderness: There is no abdominal tenderness.  Musculoskeletal: Normal range of motion.  Skin:    General: Skin is warm and dry.  Neurological:     General: No focal deficit present.     Mental Status: She is alert and oriented to person, place, and time.      ED Treatments / Results  Labs (all labs ordered are listed, but only abnormal results are displayed) Labs Reviewed  SARS CORONAVIRUS 2 (HOSPITAL ORDER, [place]) - Abnormal; Notable for the following components:      Result Value   SARS Coronavirus 2 POSITIVE (*)    All other components within normal limits  BASIC METABOLIC PANEL - Abnormal; Notable for the following components:   Potassium 3.4 (*)    CO2 21 (*)    Glucose, Bld 118 (*)    All other components within normal limits  CBC - Abnormal; Notable for the following components:   WBC 3.1 (*)    MCV 78.2 (*)    MCH 24.8 (*)    All other components within normal limits  D-DIMER, QUANTITATIVE (NOT AT [facility]) - Abnormal; Notable for the following components:   D-Dimer, Quant 0.73 (*)    All other components within normal limits  POC URINE PREG, ED  TROPONIN I (HIGH SENSITIVITY)  TROPONIN I (HIGH SENSITIVITY)    EKG EKG Interpretation  Date/Time:  Sunday July 03 2019 14:52:58 EDT Ventricular Rate:  102 PR Interval:    QRS Duration: 94 QT Interval:  351 QTC Calculation: 458 R Axis:   9 Text Interpretation:  Sinus tachycardia Confirmed by Fredia Sorrow [phone]) on 07/03/2019 3:29:41 PM   Radiology Dg Chest Portable 1 View  Result Date: 07/03/2019 CLINICAL DATA:  Chest pain EXAM: PORTABLE CHEST 1 VIEW COMPARISON:  Chest radiograph 04/18/2017 FINDINGS: Monitoring leads overlie the patient. Stable cardiac and mediastinal contours. Elevation the right hemidiaphragm. Heterogeneous opacities right lung base.  No pleural effusion or pneumothorax. IMPRESSION: Heterogeneous opacities right lung base may represent atelectasis or infection. Electronically Signed   By: Lovey Newcomer M.D.   On: 07/03/2019 15:39    Procedures Procedures (including critical care time)  Medications Ordered in ED Medications  sodium chloride flush (NS) 0.9 % injection 3 mL (0 mLs Intravenous Hold 07/03/19 1600)  0.9 %  sodium chloride infusion (has no administration in time range)  iohexol (OMNIPAQUE) 350 MG/ML injection 100 mL (has no administration in time range)  predniSONE (DELTASONE) tablet 60 mg (has no administration in time range)     Initial Impression / Assessment and Plan / ED Course  I have reviewed the triage vital signs and the nursing notes.  Pertinent labs & imaging results that were available during my care of the patient were reviewed by me and considered in my medical decision making (see chart for details).       Work-up here reconfirmed still positive for COVID-19 infection.  Chest x-ray raise concerns about bilateral lower infiltrates.  Patient's oxygen saturations here have been very good 93% or better most of the time higher.  Patient without any significant tachypnea or air hungry.  Patient afebrile.  Slightly tachycardic with heart rate around the low 100s sometimes upper 90s.  D-dimer was slightly elevated troponin negative.  Patient's had these chest symptoms now for at least 2 days nonstop.  So the single troponin is reassuring being normal.  Based on the d-dimer and the chest x-ray wanted to do CT Angie O.  But unfortunately nursing unable to get an adequate IV.  Ultrasound cannot be used to establish an IV.  And patient not in any extremitas and the d-dimer is only slightly elevated she has no leg swelling.  Not showing any evidence of significant hypoxia.  Most likely does not have pulmonary embolus.  D-dimer is probably elevated secondary COVID.  Had long discussion with patient.  She wants to  forego doing the CT Angie O.  We will treat her with prednisone.  Has a allergy listed to methylprednisolone.  With some itching.  We will go ahead and try prednisone here and a course of prednisone at home and Mucinex DM patient will return for any worsening in her breathing or air hunger.  Patient may very well be developing COVID-19 type pneumonia.  She understands that antibiotics would not be of any benefit.   Final Clinical Impressions(s) / ED Diagnoses   Final diagnoses:  Pneumonia due to COVID-19 virus    ED Discharge Orders         Ordered    predniSONE (DELTASONE) 10 MG tablet  Daily     07/03/19 2142    dextromethorphan-guaiFENesin ([place] DM) 30-600 MG 12hr tablet  2 times daily     07/03/19 2142           Fredia Sorrow, MD 07/03/19 4259    Fredia Sorrow, MD 08/30/19 1825

## 2019-07-13 ENCOUNTER — Ambulatory Visit (INDEPENDENT_AMBULATORY_CARE_PROVIDER_SITE_OTHER): Payer: 59 | Admitting: Family Medicine

## 2019-07-13 ENCOUNTER — Other Ambulatory Visit: Payer: Self-pay

## 2019-07-13 ENCOUNTER — Encounter: Payer: Self-pay | Admitting: Family Medicine

## 2019-07-13 VITALS — BP 114/78 | Ht 64.0 in | Wt 197.0 lb

## 2019-07-13 DIAGNOSIS — I10 Essential (primary) hypertension: Secondary | ICD-10-CM | POA: Diagnosis not present

## 2019-07-13 DIAGNOSIS — E1165 Type 2 diabetes mellitus with hyperglycemia: Secondary | ICD-10-CM | POA: Diagnosis not present

## 2019-07-13 DIAGNOSIS — Z8616 Personal history of COVID-19: Secondary | ICD-10-CM

## 2019-07-13 DIAGNOSIS — E669 Obesity, unspecified: Secondary | ICD-10-CM

## 2019-07-13 DIAGNOSIS — Z8619 Personal history of other infectious and parasitic diseases: Secondary | ICD-10-CM

## 2019-07-13 NOTE — Patient Instructions (Addendum)
Keep follow up as  Before, call if you need me sooner  Please schedule appt in  3 weeks ( Monday ) for flu vaccine and  Pneumonia 23 vaccine  I am thankful that you have fully recovered form your infection and feel 100% better.  CONTINUE social distancing, wearing a face mask , hand washing and keeping hands off face  Continue once daily glipizide for the next approximately 5 days, if blood sugars return within healthy range , no need to increase the dose.Also message Dr. Dorris Fetch with any further concerns/ question re blood sugar management   Think about what you will eat, plan ahead. Choose " clean, green, fresh or frozen" over canned, processed or packaged foods which are more sugary, salty and fatty. 70 to 75% of food eaten should be vegetables and fruit. Three meals at set times with snacks allowed between meals, but they must be fruit or vegetables. Aim to eat over a 12 hour period , example 7 am to 7 pm, and STOP after  your last meal of the day. Drink water,generally about 64 ounces per day, no other drink is as healthy. Fruit juice is best enjoyed in a healthy way, by EATING the fruit. It is important that you exercise regularly at least 30 minutes 5 times a week. If you develop chest pain, have severe difficulty breathing, or feel very tired, stop exercising immediately and seek medical attention  Thanks for choosing  [facility], we consider it a privelige to serve you.

## 2019-07-13 NOTE — Progress Notes (Signed)
Virtual Visit via Telephone Note  I connected with Kaylee Montgomery on 07/13/19 at  2:00 PM EDT by telephone and verified that I am speaking with the correct person using two identifiers.  Location: Patient: home  Provider: office   I discussed the limitations, risks, security and privacy concerns of performing an evaluation and management service by telephone and the availability of in person appointments. I also discussed with the patient that there may be a patient responsible charge related to this service. The patient expressed understanding and agreed to proceed.   History of Present Illness: Reports feeling much better , rept test 2 days ago is negative, will return to work in am  was treated with 40 mg pred daily for entire time , completed today, blood sugar was over 200 on prednisone, did not increase glipizide as directed by Endo , as her blood sugar had corrected on lower dose prior to illness, will follow closely with Endo. Both herself and her adult son were able to self isolate and fuly recoved from Covid.  Denies sinus pressure, nasal congestion, ear pain or sore throat. Denies chest congestion, productive cough or wheezing.Still has dry cough Denies chest pains, palpitations and leg swelling Denies abdominal pain, nausea, vomiting,diarrhea or constipation.   Denies dysuria, frequency, hesitancy or incontinence. Denies joint pain, swelling and limitation in mobility. Denies headaches, seizures, numbness, or tingling. Denies depression, anxiety or insomnia. Denies skin break down or rash.      Observations/Objective: BP 114/78   Ht 5\' 4"  (1.626 m)   Wt 197 lb (89.4 kg)   BMI 33.81 kg/m  Good communication with no confusion and intact memory. Alert and oriented x 3 No signs of respiratory distress during speech    Assessment and Plan: History of 2019 novel coronavirus disease (COVID-19) Good response to high dose steroids, rept test is negative, will return  to work F/u cXR to be obtained  Essential hypertension, benign Controlled, no change in medication DASH diet and commitment to daily physical activity for a minimum of 30 minutes discussed and encouraged, as a part of hypertension management. The importance of attaining a healthy weight is also discussed.  BP/Weight 07/13/2019 07/03/2019 06/14/2019 06/13/2019 06/09/2019 123456 [id number]  Systolic BP 99991111 [id number] [id number] 123456 123456 [id number]  Diastolic BP 78 85 78 80 76 86 80  Wt. (Lbs) 197 204 205 203 - 204.08 208.04  BMI 33.81 35.02 35.19 34.84 - 35.03 35.71       Uncontrolled type 2 diabetes mellitus with hyperglycemia (HCC) Reports improvement wih change in lifestyle and regime Recent hyperlglycemia attributed to steroids and infection, managed by Endo Ms. Rantanen is reminded of the importance of commitment to daily physical activity for 30 minutes or more, as able and the need to limit carbohydrate intake to 30 to 60 grams per meal to help with blood sugar control.   The need to take medication as prescribed, test blood sugar as directed, and to call between visits if there is a concern that blood sugar is uncontrolled is also discussed.   Ms. Rodenbeck is reminded of the importance of daily foot exam, annual eye examination, and good blood sugar, blood pressure and cholesterol control.  Diabetic Labs Latest Ref Rng & Units 07/03/2019 06/14/2019 06/09/2019 03/11/2019 06/30/2018  HbA1c <5.7 % of total Hgb - 11.9(H) - 7.7(H) 6.3(H)  Microalbumin mg/dL - - - 5.6 -  Micro/Creat Ratio <30 mcg/mg creat - - - 48(H) -  Chol <200 mg/dL - 91 - 203(H) -  HDL > OR = 50 mg/dL - 26(L) - 40(L) -  Calc LDL mg/dL (calc) - 39 - 135(H) -  Triglycerides <150 mg/dL - 180(H) - 149 -  Creatinine 0.44 - 1.00 mg/dL 0.49 - 0.61 0.56 0.68   BP/Weight 07/13/2019 07/03/2019 06/14/2019 06/13/2019 06/09/2019 123456 [id number]  Systolic BP 99991111 [id number] [id number] 123456 123456 [id number]  Diastolic BP 78 85 78 80 76 86 80  Wt. (Lbs) 197 204 205 203 -  204.08 208.04  BMI 33.81 35.02 35.19 34.84 - 35.03 35.71   Foot/eye exam completion dates 05/25/2019 03/17/2018  Foot Form Completion Done Done        Obesity (BMI 30.0-34.9)  Patient re-educated about  the importance of commitment to a  minimum of 150 minutes of exercise per week as able.  The importance of healthy food choices with portion control discussed, as well as eating regularly and within a 12 hour window most days. The need to choose "clean , green" food 50 to 75% of the time is discussed, as well as to make water the primary drink and set a goal of 64 ounces water daily.    Weight /BMI 07/13/2019 07/03/2019 06/14/2019  WEIGHT 197 lb 204 lb 205 lb  HEIGHT 5\' 4"  5\' 4"  5\' 4"   BMI 33.81 kg/m2 35.02 kg/m2 35.19 kg/m2        Follow Up Instructions:    I discussed the assessment and treatment plan with the patient. The patient was provided an opportunity to ask questions and all were answered. The patient agreed with the plan and demonstrated an understanding of the instructions.   The patient was advised to call back or seek an in-person evaluation if the symptoms worsen or if the condition fails to improve as anticipated.  I provided 22 minutes of non-face-to-face time during this encounter.   [name], MD

## 2019-07-15 MED FILL — metFORMIN HCL 1000 MG TABS: 1000 | 31 days supply | Qty: 31 | Fill #2

## 2019-07-15 MED FILL — AMLODIPINE BESYLATE 10 MG T: 10 | 30 days supply | Qty: 30 | Fill #2

## 2019-07-15 MED FILL — CYCLOBENZAPRINE 5 MG TABLET: 5 | 30 days supply | Qty: 30 | Fill #3

## 2019-07-15 MED FILL — VIT D2 1.25 MG (50,000 UNIT: 1.25 MG | 28 days supply | Qty: 4 | Fill #3

## 2019-07-15 MED FILL — ATORVASTATIN 10 MG TABLET: 10 | 30 days supply | Qty: 30 | Fill #2

## 2019-07-15 MED FILL — GABAPENTIN 300 MG CAPSULE: 300 | 31 days supply | Qty: 31 | Fill #3

## 2019-07-15 MED FILL — METOPROLOL TARTRATE 50 MG T: 50 | 30 days supply | Qty: 90 | Fill #4

## 2019-07-15 MED FILL — glipiZIDE 5 MG TABS: 5 | 30 days supply | Qty: 30 | Fill #2

## 2019-07-17 ENCOUNTER — Other Ambulatory Visit: Payer: Self-pay | Admitting: Family Medicine

## 2019-07-17 ENCOUNTER — Encounter: Payer: Self-pay | Admitting: Family Medicine

## 2019-07-17 ENCOUNTER — Telehealth: Payer: Self-pay | Admitting: Family Medicine

## 2019-07-17 DIAGNOSIS — R9389 Abnormal findings on diagnostic imaging of other specified body structures: Secondary | ICD-10-CM

## 2019-07-17 DIAGNOSIS — Z8616 Personal history of COVID-19: Secondary | ICD-10-CM | POA: Insufficient documentation

## 2019-07-17 DIAGNOSIS — Z8619 Personal history of other infectious and parasitic diseases: Secondary | ICD-10-CM

## 2019-07-17 NOTE — Assessment & Plan Note (Signed)
Reports improvement wih change in lifestyle and regime Recent hyperlglycemia attributed to steroids and infection, managed by Endo Kaylee Montgomery is reminded of the importance of commitment to daily physical activity for 30 minutes or more, as able and the need to limit carbohydrate intake to 30 to 60 grams per meal to help with blood sugar control.   The need to take medication as prescribed, test blood sugar as directed, and to call between visits if there is a concern that blood sugar is uncontrolled is also discussed.   Kaylee Montgomery is reminded of the importance of daily foot exam, annual eye examination, and good blood sugar, blood pressure and cholesterol control.  Diabetic Labs Latest Ref Rng & Units 07/03/2019 06/14/2019 06/09/2019 03/11/2019 06/30/2018  HbA1c <5.7 % of total Hgb - 11.9(H) - 7.7(H) 6.3(H)  Microalbumin mg/dL - - - 5.6 -  Micro/Creat Ratio <30 mcg/mg creat - - - 48(H) -  Chol <200 mg/dL - 91 - 203(H) -  HDL > OR = 50 mg/dL - 26(L) - 40(L) -  Calc LDL mg/dL (calc) - 39 - 135(H) -  Triglycerides <150 mg/dL - 180(H) - 149 -  Creatinine 0.44 - 1.00 mg/dL 0.49 - 0.61 0.56 0.68   BP/Weight 07/13/2019 07/03/2019 06/14/2019 06/13/2019 06/09/2019 123456 [id number]  Systolic BP 99991111 A999333 [id number] 123456 123456 [id number]  Diastolic BP 78 85 78 80 76 86 80  Wt. (Lbs) 197 204 205 203 - 204.08 208.04  BMI 33.81 35.02 35.19 34.84 - 35.03 35.71   Foot/eye exam completion dates 05/25/2019 03/17/2018  Foot Form Completion Done Done

## 2019-07-17 NOTE — Telephone Encounter (Signed)
pls call and verify pt  Is aware that she needs CXR last week in October since her CXR at tme of dx of covid 19 was abnormal and suggested pneumonia, needs follow up film

## 2019-07-17 NOTE — Assessment & Plan Note (Signed)
  Patient re-educated about  the importance of commitment to a  minimum of 150 minutes of exercise per week as able.  The importance of healthy food choices with portion control discussed, as well as eating regularly and within a 12 hour window most days. The need to choose "clean , green" food 50 to 75% of the time is discussed, as well as to make water the primary drink and set a goal of 64 ounces water daily.    Weight /BMI 07/13/2019 07/03/2019 06/14/2019  WEIGHT 197 lb 204 lb 205 lb  HEIGHT 5\' 4"  5\' 4"  5\' 4"   BMI 33.81 kg/m2 35.02 kg/m2 35.19 kg/m2

## 2019-07-17 NOTE — Assessment & Plan Note (Signed)
Controlled, no change in medication DASH diet and commitment to daily physical activity for a minimum of 30 minutes discussed and encouraged, as a part of hypertension management. The importance of attaining a healthy weight is also discussed.  BP/Weight 07/13/2019 07/03/2019 06/14/2019 06/13/2019 06/09/2019 123456 [id number]  Systolic BP [id number] [id number] 123456 123456 [id number]  Diastolic BP 78 85 78 80 76 86 80  Wt. (Lbs) 197 204 205 203 - 204.08 208.04  BMI 33.81 35.02 35.19 34.84 - 35.03 35.71

## 2019-07-17 NOTE — Assessment & Plan Note (Signed)
Good response to high dose steroids, rept test is negative, will return to work F/u cXR to be obtained

## 2019-07-18 ENCOUNTER — Ambulatory Visit: Payer: 59

## 2019-07-18 NOTE — Telephone Encounter (Signed)
Advised patient she needed xray of chest last week of October with verbal understanding

## 2019-08-01 ENCOUNTER — Other Ambulatory Visit: Payer: Self-pay | Admitting: Endocrinology

## 2019-08-01 DIAGNOSIS — D443 Neoplasm of uncertain behavior of pituitary gland: Secondary | ICD-10-CM

## 2019-08-01 DIAGNOSIS — D444 Neoplasm of uncertain behavior of craniopharyngeal duct: Secondary | ICD-10-CM

## 2019-08-02 ENCOUNTER — Encounter: Payer: Self-pay | Admitting: Family Medicine

## 2019-08-03 ENCOUNTER — Other Ambulatory Visit (HOSPITAL_COMMUNITY)
Admission: RE | Admit: 2019-08-03 | Discharge: 2019-08-03 | Disposition: A | Discharge status: Home / Self Care | Payer: 59 | Source: Ambulatory Visit | Attending: Family Medicine | Admitting: Family Medicine

## 2019-08-03 ENCOUNTER — Encounter: Payer: Self-pay | Admitting: Family Medicine

## 2019-08-03 ENCOUNTER — Other Ambulatory Visit: Payer: Self-pay

## 2019-08-03 ENCOUNTER — Ambulatory Visit (INDEPENDENT_AMBULATORY_CARE_PROVIDER_SITE_OTHER): Payer: 59 | Admitting: Family Medicine

## 2019-08-03 DIAGNOSIS — B373 Candidiasis of vulva and vagina: Secondary | ICD-10-CM | POA: Insufficient documentation

## 2019-08-03 DIAGNOSIS — B3731 Acute candidiasis of vulva and vagina: Secondary | ICD-10-CM

## 2019-08-03 DIAGNOSIS — R319 Hematuria, unspecified: Secondary | ICD-10-CM | POA: Diagnosis not present

## 2019-08-03 LAB — POCT URINALYSIS DIP (CLINITEK)
Bilirubin, UA: NEGATIVE
Glucose, UA: 500 mg/dL — AB
Ketones, POC UA: NEGATIVE mg/dL
Leukocytes, UA: NEGATIVE
Nitrite, UA: NEGATIVE
POC PROTEIN,UA: NEGATIVE
Spec Grav, UA: <=1.005 — AB (ref 1.010–1.025)
Urobilinogen, UA: 0.2 E.U./dL
pH, UA: 6 (ref 5.0–8.0)

## 2019-08-03 MED ORDER — FLUCONAZOLE 150 MG PO TABS: 150.0000 mg | ORAL_TABLET | Freq: Every day | ORAL | 0 refills | Status: DC

## 2019-08-03 NOTE — Patient Instructions (Signed)
  I appreciate the opportunity to provide you with the care for your health and wellness. Today we discussed: yeast infection  Follow up: 11/01/2019-as schedueled  No labs or referrals today  We will notify you of urine results.  Take medication as directed, call if you have any issue or if symptoms do not go away with treatment. Hydrate with water and practice good hygiene.  Please continue to practice social distancing to keep you, your family, and our community safe.  If you must go out, please wear a mask and practice good handwashing.  It was a pleasure to see you and I look forward to continuing to work together on your health and well-being. Please do not hesitate to call the office if you need care or have questions about your care.  Have a wonderful day and week. With Gratitude, Cherly Beach, DNP, AGNP-BC

## 2019-08-03 NOTE — Progress Notes (Signed)
Virtual Visit via Telephone Note   This visit type was conducted due to national recommendations for restrictions regarding the COVID-19 Pandemic (e.g. social distancing) in an effort to limit this patient's exposure and mitigate transmission in our community.  Due to her co-morbid illnesses, this patient is at least at moderate risk for complications without adequate follow up.  This format is felt to be most appropriate for this patient at this time.  The patient did not have access to video technology/had technical difficulties with video requiring transitioning to audio format only (telephone).  All issues noted in this document were discussed and addressed.  No physical exam could be performed with this format.   Evaluation Performed:  Follow-up visit  Date:  08/03/2019   ID:  Kaylee Montgomery, Kaylee Montgomery [birth year], MRN [id number]  Patient Location: Home Provider Location: Office  Location of Patient: Home Location of Provider: Telehealth Consent was obtain for visit to be over via telehealth. I verified that I am speaking with the correct person using two identifiers.  PCP:  [name], MD   Chief Complaint:  Yeast infection   History of Present Illness:    Kaylee Montgomery is a 38 y.o. female with history of hypertension, migraines, pituitary adenoma, seasonal allergies among others.  Recent infection with COVID and pneumonia.  Now presents with yeast infection secondary to increased drainage and discharge vaginally and itching.  Denies having any odor.  Denies having any burning.  Denies having any urinary symptoms.  Denies having any back pain or pelvic pain.  Denies having any other signs or symptoms of infection at this time.  The patient does not have symptoms concerning for COVID-19 infection (fever, chills, cough, or new shortness of breath).   Past Medical, Surgical, Social History, Allergies, and Medications have been Reviewed.  Past Medical History:   Diagnosis Date  . Breast mass 02/2017  . Dental crown present   . Hypertension    states BP fluctuates; has been on med. x 1 yr.  . Migraines   . 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 Pituitary adenoma ([place])   . Seasonal allergies   . Sleep apnea    no CPAP use in > 6 mos.   Past Surgical History:  Procedure Laterality Date  . BREAST LUMPECTOMY WITH RADIOACTIVE SEED LOCALIZATION Left 03/05/2017   Procedure: LEFT BREAST LUMPECTOMY WITH RADIOACTIVE SEED LOCALIZATION;  Surgeon: Donnie Mesa, MD;  Location: [facility];  Service: General;  Laterality: Left;  . CESAREAN SECTION    . CHOLECYSTECTOMY  08/2017   [facility]  . ESOPHAGOGASTRODUODENOSCOPY  07/21/2017   [facility]: Dr. Delrae Alfred. healthy appearing cyst cavity without necrotic material that was decreased in size. Removal of stent scheduled for 11/12.   Marland Kitchen EUS  06/19/2017   [facility]: 7 x 7.3 cm cyst with necrotic material noted adjacent to the body of the pancreas. FNA without malignancy.  Marland Kitchen LAPAROSCOPIC GASTRIC SLEEVE RESECTION N/A 02/18/2016   Procedure: LAPAROSCOPIC GASTRIC SLEEVE RESECTION, UPPER ENDO;  Surgeon: Greer Pickerel, MD;  Location: [facility];  Service: General;  Laterality: N/A;  . 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 TONSILLECTOMY  age 45     Current Meds  Medication Sig  . acetaminophen (TYLENOL) 500 MG tablet Take 1,000 mg by mouth every 6 (six) hours as needed for mild pain or moderate pain.   Marland Kitchen amLODipine (NORVASC) 10 MG tablet  TAKE 1 TABLET (10 MG TOTAL) BY MOUTH DAILY  . atorvastatin (LIPITOR) 10 MG tablet Take 1 tablet (10 mg total) by mouth daily.  . cyclobenzaprine (FLEXERIL) 5 MG tablet Take 1 tablet (5 mg total) by mouth at bedtime.  . Empagliflozin-metFORMIN HCl ER (SYNJARDY XR) 12.02-999 MG TB24 Take 1 tablet by mouth 2 (two) times daily.  Marland Kitchen gabapentin (NEURONTIN) 300 MG capsule Take 1 capsule (300 mg  total) by mouth daily.  Marland Kitchen glipiZIDE (GLUCOTROL XL) 10 MG 24 hr tablet Take 1 tablet (10 mg total) by mouth daily with breakfast.  . glucose blood (ONETOUCH VERIO) test strip Use as instructed bid. E11.65  . Lancets MISC 1 each by Does not apply route 2 (two) times daily.  . metFORMIN (GLUCOPHAGE) 1000 MG tablet Take 1 tablet (1,000 mg total) by mouth 2 (two) times daily with a meal. Dose reduction effective 07/01/2018  . Metoprolol Tartrate 75 MG TABS Take 75 mg by mouth 2 (two) times daily.  . Vitamin D, Ergocalciferol, (DRISDOL) 1.25 MG (50000 UT) CAPS capsule Take 1 capsule (50,000 Units total) by mouth every 7 (seven) days. (Patient taking differently: Take 50,000 Units by mouth every Tuesday. )     Allergies:   Bromocriptine mesylate, Depo-medrol [methylprednisolone acetate], Dilaudid [hydromorphone hcl], and Imitrex [sumatriptan]   Social History   Tobacco Use  . Smoking status: Never Smoker  . Smokeless tobacco: Never Used  Substance Use Topics  . Alcohol use: No    Alcohol/week: 0.0 standard drinks    Comment: never drank regularly or heavily but NO etoh at all since 01/2017  . Drug use: No     Family Hx: The patient's family history includes Diabetes in her maternal grandfather; Heart disease in her father; Migraines in her father and son. There is no history of Colon cancer.  ROS:   Please see the history of present illness.    All other systems reviewed and are negative.   Labs/Other Tests and Data Reviewed:    Recent Labs: 03/11/2019: TSH 1.60 06/09/2019: ALT 48 07/03/2019: BUN 6; Creatinine, Ser 0.49; Hemoglobin 12.3; Platelets 313; Potassium 3.4; Sodium 137   Recent Lipid Panel Lab Results  Component Value Date/Time   CHOL 91 06/14/2019 07:48 AM   TRIG 180 (H) 06/14/2019 07:48 AM   HDL 26 (L) 06/14/2019 07:48 AM   CHOLHDL 3.5 06/14/2019 07:48 AM   LDLCALC 39 06/14/2019 07:48 AM    Wt Readings from Last 3 Encounters:  07/13/19 197 lb (89.4 kg)  07/03/19 204  lb (92.5 kg)  06/14/19 205 lb (93 kg)     Objective:    Vital Signs:  There were no vitals taken for this visit.   GEN:  alert and oriented RESPIRATORY:  no shortness of breath noted in conversation PSYCH:  normal mood and affect  ASSESSMENT & PLAN:    1. Vaginal candida S&S consistent with yeast infection, vaginal. She had recent infections-COVID and PNA, and yeast might have came from medication use. Diflucan provided. Urine dip and cx ordered. Education on good hygiene provided.  - fluconazole (DIFLUCAN) 150 MG tablet; Take 1 tablet (150 mg total) by mouth daily.  Dispense: 2 tablet; Refill: 0 - POCT URINALYSIS DIP (CLINITEK) - Urine cytology ancillary only   Time:   Today, I have spent 10 minutes with the patient with telehealth technology discussing the above problems.     Medication Adjustments/Labs and Tests Ordered: Current medicines are reviewed at length with the patient today.  Concerns regarding  medicines are outlined above.   Tests Ordered: Orders Placed This Encounter  Procedures  . POCT URINALYSIS DIP (CLINITEK)    Medication Changes: Meds ordered this encounter  Medications  . fluconazole (DIFLUCAN) 150 MG tablet    Sig: Take 1 tablet (150 mg total) by mouth daily.    Dispense:  2 tablet    Refill:  0    Order Specific Question:   Supervising Provider    Answer:   [name] [id number]    Disposition:  Follow up 11/01/2019  Signed, Perlie Mayo, NP  08/03/2019 12:49 PM     [facility]

## 2019-08-04 ENCOUNTER — Other Ambulatory Visit (HOSPITAL_COMMUNITY)
Admission: RE | Admit: 2019-08-04 | Discharge: 2019-08-04 | Disposition: A | Discharge status: Home / Self Care | Payer: 59 | Source: Ambulatory Visit | Attending: Family Medicine | Admitting: Family Medicine

## 2019-08-04 DIAGNOSIS — R319 Hematuria, unspecified: Secondary | ICD-10-CM | POA: Insufficient documentation

## 2019-08-05 LAB — URINE CULTURE: Culture: <10000 — AB

## 2019-08-09 LAB — URINE CYTOLOGY ANCILLARY ONLY: Candida Urine: NEGATIVE

## 2019-08-09 NOTE — Progress Notes (Signed)
Negative for yeast as well.  Advised to practice good hygiene.  And to drink adequate amount of water.

## 2019-08-09 NOTE — Progress Notes (Signed)
Urine, clear, make sure symptoms are good.

## 2019-08-30 ENCOUNTER — Other Ambulatory Visit: Payer: Self-pay

## 2019-08-30 MED ORDER — AMLODIPINE BESYLATE 10 MG PO TABS: 10.0000 mg | ORAL_TABLET | Freq: Every day | ORAL | 0 refills | Status: DC

## 2019-08-31 ENCOUNTER — Ambulatory Visit: Payer: 59 | Admitting: Family Medicine

## 2019-09-13 ENCOUNTER — Ambulatory Visit: Payer: 59 | Admitting: Gastroenterology

## 2019-09-26 ENCOUNTER — Ambulatory Visit: Payer: 59 | Admitting: "Endocrinology

## 2019-09-26 ENCOUNTER — Other Ambulatory Visit: Payer: Self-pay

## 2019-09-26 DIAGNOSIS — E559 Vitamin D deficiency, unspecified: Secondary | ICD-10-CM

## 2019-09-26 MED ORDER — VITAMIN D (ERGOCALCIFEROL) 1.25 MG (50000 UNIT) PO CAPS: 50000.0000 [IU] | ORAL_CAPSULE | ORAL | 1 refills | Status: DC

## 2019-09-30 ENCOUNTER — Other Ambulatory Visit: Payer: Self-pay

## 2019-09-30 ENCOUNTER — Ambulatory Visit
Admission: RE | Admit: 2019-09-30 | Discharge: 2019-09-30 | Disposition: A | Discharge status: Home / Self Care | Payer: BC Managed Care – PPO | Source: Ambulatory Visit | Attending: Endocrinology | Admitting: Endocrinology

## 2019-09-30 DIAGNOSIS — D443 Neoplasm of uncertain behavior of pituitary gland: Secondary | ICD-10-CM

## 2019-09-30 DIAGNOSIS — D444 Neoplasm of uncertain behavior of craniopharyngeal duct: Secondary | ICD-10-CM

## 2019-09-30 MED ORDER — GADOBENATE DIMEGLUMINE 529 MG/ML IV SOLN
9.0000 mL | Freq: Once | INTRAVENOUS | Status: AC | PRN
  Administered 2019-09-30: 9 mL via INTRAVENOUS

## 2019-10-25 ENCOUNTER — Other Ambulatory Visit: Payer: Self-pay

## 2019-10-25 MED ORDER — AMLODIPINE BESYLATE 10 MG PO TABS: 10.0000 mg | ORAL_TABLET | Freq: Every day | ORAL | 0 refills | Status: DC

## 2019-11-01 ENCOUNTER — Other Ambulatory Visit: Payer: Self-pay

## 2019-11-01 ENCOUNTER — Encounter: Payer: Self-pay | Admitting: Family Medicine

## 2019-11-01 ENCOUNTER — Telehealth (INDEPENDENT_AMBULATORY_CARE_PROVIDER_SITE_OTHER): Payer: BC Managed Care – PPO | Admitting: Family Medicine

## 2019-11-01 VITALS — BP 114/78 | Ht 64.0 in | Wt 196.0 lb

## 2019-11-01 DIAGNOSIS — L03317 Cellulitis of buttock: Secondary | ICD-10-CM | POA: Diagnosis not present

## 2019-11-01 DIAGNOSIS — E1165 Type 2 diabetes mellitus with hyperglycemia: Secondary | ICD-10-CM

## 2019-11-01 DIAGNOSIS — E669 Obesity, unspecified: Secondary | ICD-10-CM

## 2019-11-01 DIAGNOSIS — I10 Essential (primary) hypertension: Secondary | ICD-10-CM

## 2019-11-01 DIAGNOSIS — E1169 Type 2 diabetes mellitus with other specified complication: Secondary | ICD-10-CM | POA: Diagnosis not present

## 2019-11-01 DIAGNOSIS — Z1231 Encounter for screening mammogram for malignant neoplasm of breast: Secondary | ICD-10-CM

## 2019-11-01 DIAGNOSIS — E66811 Obesity, class 1: Secondary | ICD-10-CM

## 2019-11-01 DIAGNOSIS — E559 Vitamin D deficiency, unspecified: Secondary | ICD-10-CM

## 2019-11-01 MED ORDER — FLUCONAZOLE 150 MG PO TABS: ORAL_TABLET | ORAL | 0 refills | Status: DC

## 2019-11-01 MED ORDER — CEPHALEXIN 500 MG PO CAPS: 500.0000 mg | ORAL_CAPSULE | Freq: Four times a day (QID) | ORAL | 0 refills | Status: DC

## 2019-11-01 NOTE — Patient Instructions (Addendum)
F/U in office with MD in June, call if you need me sooner  Please schedule mammogram at breast center , give pt appt information  Keflex and fluconazole are prescribed for skin infection  Please get fasting lipid, cmp and EGr, hBA1C and microalb and vit D at [facility] this week please  Think about what you will eat, plan ahead. Choose " clean, green, fresh or frozen" over canned, processed or packaged foods which are more sugary, salty and fatty. 70 to 75% of food eaten should be vegetables and fruit. Three meals at set times with snacks allowed between meals, but they must be fruit or vegetables. Aim to eat over a 12 hour period , example 7 am to 7 pm, and STOP after  your last meal of the day. Drink water,generally about 64 ounces per day, no other drink is as healthy. Fruit juice is best enjoyed in a healthy way, by EATING the fruit.  It is important that you exercise regularly at least 30 minutes 5 times a week. If you develop chest pain, have severe difficulty breathing, or feel very tired, stop exercising immediately and seek medical attention   Thanks for choosing [facility], we consider it a privelige to serve you.

## 2019-11-01 NOTE — Progress Notes (Signed)
Virtual Visit via Telephone Note  I connected with Kaylee Montgomery on 11/01/19 at  4:00 PM EST by telephone and verified that I am speaking with the correct person using two identifiers.  Location: Patient: home Provider: office   I discussed the limitations, risks, security and privacy concerns of performing an evaluation and management service by telephone and the availability of in person appointments. I also discussed with the patient that there may be a patient responsible charge related to this service. The patient expressed understanding and agreed to proceed.   History of Present Illness: 2 day h/o buttock boil, recurrent skin infection has been a major problem. Denies fevr or chills, not draining yet , but painful Reports improved blood sugar  Denies polyuria, polydipsia, blurred vision , or hypoglycemic episodes., states blood sugar has improved Denies recent fever or chills. Denies sinus pressure, nasal congestion, ear pain or sore throat. Denies chest congestion, productive cough or wheezing. Denies chest pains, palpitations and leg swelling Denies abdominal pain, nausea, vomiting,diarrhea or constipation.   Denies dysuria, frequency, hesitancy or incontinence. Denies joint pain, swelling and limitation in mobility. Denies headaches, seizures, numbness, or tingling. Denies depression, anxiety or insomnia.         Observations/Objective: BP 114/78   Ht 5\' 4"  (1.626 m)   Wt 196 lb (88.9 kg)   BMI 33.64 kg/m  Good communication with no confusion and intact memory. Alert and oriented x 3 No signs of respiratory distress during speech '   Assessment and Plan:  Cellulitis Course of keflex is prescribed , and fluconazole if needed.  Advised patient to desist from excessive skin trauma  Uncontrolled type 2 diabetes mellitus with hyperglycemia [facility]) Kaylee Montgomery is reminded of the importance of commitment to daily physical activity for 30 minutes or more, as able  and the need to limit carbohydrate intake to 30 to 60 grams per meal to help with blood sugar control.   The need to take medication as prescribed, test blood sugar as directed, and to call between visits if there is a concern that blood sugar is uncontrolled is also discussed.   Kaylee Montgomery is reminded of the importance of daily foot exam, annual eye examination, and good blood sugar, blood pressure and cholesterol control. Updated lab past due  Diabetic Labs Latest Ref Rng & Units 07/03/2019 06/14/2019 06/09/2019 03/11/2019 06/30/2018  HbA1c <5.7 % of total Hgb - 11.9(H) - 7.7(H) 6.3(H)  Microalbumin mg/dL - - - 5.6 -  Micro/Creat Ratio <30 mcg/mg creat - - - 48(H) -  Chol <200 mg/dL - 91 - 203(H) -  HDL > OR = 50 mg/dL - 26(L) - 40(L) -  Calc LDL mg/dL (calc) - 39 - 135(H) -  Triglycerides <150 mg/dL - 180(H) - 149 -  Creatinine 0.44 - 1.00 mg/dL 0.49 - 0.61 0.56 0.68   BP/Weight 11/01/2019 07/13/2019 07/03/2019 06/14/2019 06/13/2019 06/09/2019 [id number]  Systolic BP 99991111 [id number] [id number] [id number] [id number] [id number]  Diastolic BP 78 78 85 78 80 76 86  Wt. (Lbs) 196 197 204 205 203 - 204.08  BMI 33.64 33.81 35.02 35.19 34.84 - 35.03   Foot/eye exam completion dates 05/25/2019 03/17/2018  Foot Form Completion Done Done         Obesity (BMI 30.0-34.9)  Patient re-educated about  the importance of commitment to a  minimum of 150 minutes of exercise per week as able.  The importance of healthy food choices with portion control discussed, as well as eating regularly and  within a 12 hour window most days. The need to choose "clean , green" food 50 to 75% of the time is discussed, as well as to make water the primary drink and set a goal of 64 ounces water daily.    Weight /BMI 11/01/2019 07/13/2019 07/03/2019  WEIGHT 196 lb 197 lb 204 lb  HEIGHT 5\' 4"  5\' 4"  5\' 4"   BMI 33.64 kg/m2 33.81 kg/m2 35.02 kg/m2      Essential hypertension, benign Controlled, no change in medication DASH diet and commitment to daily  physical activity for a minimum of 30 minutes discussed and encouraged, as a part of hypertension management. The importance of attaining a healthy weight is also discussed.  BP/Weight 11/01/2019 07/13/2019 07/03/2019 06/14/2019 06/13/2019 06/09/2019 [id number]  Systolic BP [id number] 123456 [id number] 123456  Diastolic BP 78 78 85 78 80 76 86  Wt. (Lbs) 196 197 204 205 203 - 204.08  BMI 33.64 33.81 35.02 35.19 34.84 - 35.03       Follow Up Instructions:    I discussed the assessment and treatment plan with the patient. The patient was provided an opportunity to ask questions and all were answered. The patient agreed with the plan and demonstrated an understanding of the instructions.   The patient was advised to call back or seek an in-person evaluation if the symptoms worsen or if the condition fails to improve as anticipated.  I provided 21 minutes of non-face-to-face time during this encounter.   [name], MD

## 2019-11-07 ENCOUNTER — Encounter: Payer: Self-pay | Admitting: Family Medicine

## 2019-11-07 DIAGNOSIS — L0390 Cellulitis, unspecified: Secondary | ICD-10-CM | POA: Insufficient documentation

## 2019-11-07 NOTE — Assessment & Plan Note (Signed)
[name] is reminded of the importance of commitment to daily physical activity for 30 minutes or more, as able and the need to limit carbohydrate intake to 30 to 60 grams per meal to help with blood sugar control.   The need to take medication as prescribed, test blood sugar as directed, and to call between visits if there is a concern that blood sugar is uncontrolled is also discussed.   Kaylee Montgomery is reminded of the importance of daily foot exam, annual eye examination, and good blood sugar, blood pressure and cholesterol control. Updated lab past due  Diabetic Labs Latest Ref Rng & Units 07/03/2019 06/14/2019 06/09/2019 03/11/2019 06/30/2018  HbA1c <5.7 % of total Hgb - 11.9(H) - 7.7(H) 6.3(H)  Microalbumin mg/dL - - - 5.6 -  Micro/Creat Ratio <30 mcg/mg creat - - - 48(H) -  Chol <200 mg/dL - 91 - 203(H) -  HDL > OR = 50 mg/dL - 26(L) - 40(L) -  Calc LDL mg/dL (calc) - 39 - 135(H) -  Triglycerides <150 mg/dL - 180(H) - 149 -  Creatinine 0.44 - 1.00 mg/dL 0.49 - 0.61 0.56 0.68   BP/Weight 11/01/2019 07/13/2019 07/03/2019 06/14/2019 06/13/2019 06/09/2019 123456  Systolic BP 99991111 99991111 A999333 99991111 123456 123456 123456  Diastolic BP 78 78 85 78 80 76 86  Wt. (Lbs) 196 197 204 205 203 - 204.08  BMI 33.64 33.81 35.02 35.19 34.84 - 35.03   Foot/eye exam completion dates 05/25/2019 03/17/2018  Foot Form Completion Done Done

## 2019-11-07 NOTE — Assessment & Plan Note (Signed)
Controlled, no change in medication DASH diet and commitment to daily physical activity for a minimum of 30 minutes discussed and encouraged, as a part of hypertension management. The importance of attaining a healthy weight is also discussed.  BP/Weight 11/01/2019 07/13/2019 07/03/2019 06/14/2019 06/13/2019 06/09/2019 123456  Systolic BP 99991111 99991111 [id number] [id number] 123456 123456 123456  Diastolic BP 78 78 85 78 80 76 86  Wt. (Lbs) 196 197 204 205 203 - 204.08  BMI 33.64 33.81 35.02 35.19 34.84 - 35.03

## 2019-11-07 NOTE — Assessment & Plan Note (Signed)
  Patient re-educated about  the importance of commitment to a  minimum of 150 minutes of exercise per week as able.  The importance of healthy food choices with portion control discussed, as well as eating regularly and within a 12 hour window most days. The need to choose "clean , green" food 50 to 75% of the time is discussed, as well as to make water the primary drink and set a goal of 64 ounces water daily.    Weight /BMI 11/01/2019 07/13/2019 07/03/2019  WEIGHT 196 lb 197 lb 204 lb  HEIGHT 5\' 4"  5\' 4"  5\' 4"   BMI 33.64 kg/m2 33.81 kg/m2 35.02 kg/m2

## 2019-11-07 NOTE — Assessment & Plan Note (Signed)
Course of keflex is prescribed , and fluconazole if needed.  Advised patient to desist from excessive skin trauma

## 2019-11-18 ENCOUNTER — Other Ambulatory Visit: Payer: Self-pay

## 2019-11-18 ENCOUNTER — Ambulatory Visit
Admission: RE | Admit: 2019-11-18 | Discharge: 2019-11-18 | Disposition: A | Discharge status: Home / Self Care | Payer: BC Managed Care – PPO | Source: Ambulatory Visit | Attending: Family Medicine | Admitting: Family Medicine

## 2019-11-18 DIAGNOSIS — Z1231 Encounter for screening mammogram for malignant neoplasm of breast: Secondary | ICD-10-CM

## 2019-11-30 ENCOUNTER — Other Ambulatory Visit: Payer: Self-pay | Admitting: Family Medicine

## 2019-12-05 ENCOUNTER — Other Ambulatory Visit: Payer: Self-pay

## 2019-12-05 ENCOUNTER — Encounter: Payer: Self-pay | Admitting: Neurology

## 2019-12-05 ENCOUNTER — Ambulatory Visit: Payer: BC Managed Care – PPO | Admitting: Neurology

## 2019-12-05 VITALS — BP 136/94 | HR 90 | Temp 97.3°F | Ht 64.0 in | Wt 196.6 lb

## 2019-12-05 DIAGNOSIS — M542 Cervicalgia: Secondary | ICD-10-CM

## 2019-12-05 DIAGNOSIS — G43019 Migraine without aura, intractable, without status migrainosus: Secondary | ICD-10-CM

## 2019-12-05 MED ORDER — METOPROLOL TARTRATE 75 MG PO TABS: 75.0000 mg | ORAL_TABLET | Freq: Two times a day (BID) | ORAL | 3 refills | Status: DC

## 2019-12-05 MED ORDER — CYCLOBENZAPRINE HCL 10 MG PO TABS: 10.0000 mg | ORAL_TABLET | Freq: Every day | ORAL | 1 refills | Status: DC

## 2019-12-05 MED ORDER — GABAPENTIN 300 MG PO CAPS: 300.0000 mg | ORAL_CAPSULE | Freq: Every day | ORAL | 3 refills | Status: DC

## 2019-12-05 NOTE — Patient Instructions (Signed)
Try an increase of the Flexeril 10 mg at bedtime, we could also increase gabapentin in the future. Continue taking Metoprolol. If headaches do not improve, please let me know, we will consider other options.

## 2019-12-05 NOTE — Progress Notes (Signed)
I have read the note, and I agree with the clinical assessment and plan.  Kaylee Montgomery Kaylee Montgomery   

## 2019-12-05 NOTE — Progress Notes (Addendum)
PATIENT: Kaylee Montgomery DOB: [birth year]  REASON FOR VISIT: follow up HISTORY FROM: patient  HISTORY OF PRESENT ILLNESS: Today 12/05/19  Kaylee Montgomery is a 39 year old female with history of migraine headache.  She is currently taking Flexeril, metoprolol, and gabapentin.  She takes Tylenol if needed for headache.  She says she cannot take NSAID medication secondary to recurring bouts of pancreatitis.  She reports recently her headaches have increased, has been having daily moderate headache, she thinks likely related to return of her menstrual cycle.  She had MRI of the brain in December for evaluation of pituitary tumor, showed no finding of residual or recurrent pituitary microadenoma or macroadenoma.  The pituitary gland is normal, except for a 2 mm pats intermedia cyst.  For her headaches, she has been taking 800 mg daily ibuprofen.  She indicates the change in weather is a trigger for her headache.  She complains of occipital headache, does not radiate forward, tension in her neck, also frontal headache in between her eyes.  She denies sensitivity to light or sound.  She presents today for evaluation unaccompanied.  HISTORY 12/02/2018 Dr. Jannifer Franklin: Kaylee Montgomery is a 39 year old right-handed black female with a history of migraine headache.  The patient has done fairly well on her current regimen, she uses Flexeril 10 mg at night and she is on metoprolol and gabapentin.  She indicates that she is having about 1 headache every 2 weeks, the headaches usually are not disabling, she may have some neck and shoulder discomfort while at work, sometimes she will develop eyestrain headaches, wearing her glasses seems to help.  Overall, she is pleased with her progress, she takes Tylenol if needed for the headache.  She has been told that she cannot be on nonsteroidal anti-inflammatory medications secondary to recurring bouts of pancreatitis.   REVIEW OF SYSTEMS: Out of a complete 14 system review of  symptoms, the patient complains only of the following symptoms, and all other reviewed systems are negative.  Headache  ALLERGIES: Allergies  Allergen Reactions  . Bromocriptine Mesylate Nausea Only  . Depo-Medrol [Methylprednisolone Sodium Succ] Itching and Rash  . Dilaudid [Hydromorphone Hcl] Itching  . Imitrex [Sumatriptan] Rash    HOME MEDICATIONS: Outpatient Medications Prior to Visit  Medication Sig Dispense Refill  . acetaminophen (TYLENOL) 500 MG tablet Take 1,000 mg by mouth every 6 (six) hours as needed for mild pain or moderate pain.     Marland Kitchen amLODipine (NORVASC) 10 MG tablet TAKE 1 TABLET(10 MG) BY MOUTH DAILY 90 tablet 0  . atorvastatin (LIPITOR) 10 MG tablet Take 1 tablet (10 mg total) by mouth daily. 90 tablet 3  . Empagliflozin-metFORMIN HCl ER (SYNJARDY XR) 12.02-999 MG TB24 Take 1 tablet by mouth 2 (two) times daily.    Marland Kitchen glucose blood (ONETOUCH VERIO) test strip Use as instructed bid. E11.65 100 each 5  . Lancets MISC 1 each by Does not apply route 2 (two) times daily. 100 each 5  . Naproxen Sodium (ALEVE PO) Take by mouth as needed.    . Vitamin D, Ergocalciferol, (DRISDOL) 1.25 MG (50000 UT) CAPS capsule Take 1 capsule (50,000 Units total) by mouth every 7 (seven) days. 12 capsule 1  . cyclobenzaprine (FLEXERIL) 5 MG tablet Take 1 tablet (5 mg total) by mouth at bedtime. 90 tablet 3  . gabapentin (NEURONTIN) 300 MG capsule Take 1 capsule (300 mg total) by mouth daily. 90 capsule 3  . Metoprolol Tartrate 75 MG TABS Take 75 mg by mouth 2 (  two) times daily. 180 tablet 3  . cephALEXin (KEFLEX) 500 MG capsule Take 1 capsule (500 mg total) by mouth 4 (four) times daily. 28 capsule 0  . fluconazole (DIFLUCAN) 150 MG tablet Take 1 tablet (150 mg total) by mouth daily. 2 tablet 0  . fluconazole (DIFLUCAN) 150 MG tablet Take one tablet once for vaginal itch associated with antibiotic use, 2 tablet 0   No facility-administered medications prior to visit.    PAST MEDICAL  HISTORY: Past Medical History:  Diagnosis Date  . Breast mass 02/2017  . Dental crown present   . Hypertension    states BP fluctuates; has been on med. x 1 yr.  . Migraines   . 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 Pituitary adenoma ([place])   . Seasonal allergies   . Sleep apnea    no CPAP use in > 6 mos.    PAST SURGICAL HISTORY: Past Surgical History:  Procedure Laterality Date  . BREAST EXCISIONAL BIOPSY Left 2018   Negative  . BREAST LUMPECTOMY WITH RADIOACTIVE SEED LOCALIZATION Left 03/05/2017   Procedure: LEFT BREAST LUMPECTOMY WITH RADIOACTIVE SEED LOCALIZATION;  Surgeon: Donnie Mesa, MD;  Location: [facility];  Service: General;  Laterality: Left;  . CESAREAN SECTION    . CHOLECYSTECTOMY  08/2017   [facility]  . ESOPHAGOGASTRODUODENOSCOPY  07/21/2017   [facility]: Dr. Delrae Alfred. healthy appearing cyst cavity without necrotic material that was decreased in size. Removal of stent scheduled for 11/12.   Marland Kitchen EUS  06/19/2017   [facility]: 7 x 7.3 cm cyst with necrotic material noted adjacent to the body of the pancreas. FNA without malignancy.  Marland Kitchen LAPAROSCOPIC GASTRIC SLEEVE RESECTION N/A 02/18/2016   Procedure: LAPAROSCOPIC GASTRIC SLEEVE RESECTION, UPPER ENDO;  Surgeon: Greer Pickerel, MD;  Location: [facility];  Service: General;  Laterality: N/A;  . 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 TONSILLECTOMY  age 63    FAMILY HISTORY: Family History  Problem Relation Age of Onset  . Heart disease Father        stent  . Migraines Father   . Migraines Son   . Diabetes Maternal Grandfather   . Colon cancer Neg Hx     SOCIAL HISTORY: Social History   Socioeconomic History  . Marital status: Single    Spouse name: Not on file  . Number of children: 1  . Years of education: Not on file  . Highest education level: Not on file  Occupational History  . Occupation: STUDENT   .  Occupation: CASEWORKER    Employer: [employer]  Tobacco Use  . Smoking status: Never Smoker  . Smokeless tobacco: Never Used  Substance and Sexual Activity  . Alcohol use: No    Alcohol/week: 0.0 standard drinks    Comment: never drank regularly or heavily but NO etoh at all since 01/2017  . Drug use: No  . Sexual activity: Not on file  Other Topics Concern  . Not on file  Social History Narrative   Patient is right handed.   Patient drinks 1-2 cups of caffeine daily.   Social Determinants of Health   Financial Resource Strain:   . Difficulty of Paying Living Expenses: Not on file  Food Insecurity:   . Worried About Charity fundraiser in the Last Year: Not on file  . Ran Out of Food in the Last Year: Not on file  Transportation Needs:   . Film/video editor (Medical): Not on file  . Lack of Transportation (Non-Medical): Not on file  Physical Activity:   . Days of Exercise per Week: Not on file  . Minutes of Exercise per Session: Not on file  Stress:   . Feeling of Stress : Not on file  Social Connections:   . Frequency of Communication with Friends and Family: Not on file  . Frequency of Social Gatherings with Friends and Family: Not on file  . Attends Religious Services: Not on file  . Active Member of Clubs or Organizations: Not on file  . Attends Archivist Meetings: Not on file  . Marital Status: Not on file  Intimate Partner Violence:   . Fear of Current or Ex-Partner: Not on file  . Emotionally Abused: Not on file  . Physically Abused: Not on file  . Sexually Abused: Not on file   PHYSICAL EXAM  Vitals:   12/05/19 1112  BP: (!) 136/94  Pulse: 90  Temp: (!) 97.3 F (36.3 C)  TempSrc: Oral  Weight: 196 lb 9.6 oz (89.2 kg)  Height: 5\' 4"  (1.626 m)   Body mass index is 33.75 kg/m.  Generalized: Well developed, in no acute distress  Neurological examination  Mentation: Alert oriented to time, place, history taking. Follows all  commands speech and language fluent Cranial nerve II-XII: Pupils were equal round reactive to light. Extraocular movements were full, visual field were full on confrontational test. Facial sensation and strength were normal. Head turning and shoulder shrug  were normal and symmetric. Motor: The motor testing reveals 5 over 5 strength of all 4 extremities. Good symmetric motor tone is noted throughout.  Sensory: Sensory testing is intact to soft touch on all 4 extremities. No evidence of extinction is noted.  Coordination: Cerebellar testing reveals good finger-nose-finger and heel-to-shin bilaterally.  Gait and station: Gait is normal. Tandem gait is normal. Romberg is negative. No drift is seen.  Reflexes: Deep tendon reflexes are symmetric and normal bilaterally.   DIAGNOSTIC DATA (LABS, IMAGING, TESTING) - I reviewed patient records, labs, notes, testing and imaging myself where available.  Lab Results  Component Value Date   WBC 3.1 (L) 07/03/2019   HGB 12.3 07/03/2019   HCT 38.8 07/03/2019   MCV 78.2 (L) 07/03/2019   PLT 313 07/03/2019      Component Value Date/Time   NA 137 07/03/2019 1605   K 3.4 (L) 07/03/2019 1605   CL 104 07/03/2019 1605   CO2 21 (L) 07/03/2019 1605   GLUCOSE 118 (H) 07/03/2019 1605   BUN 6 07/03/2019 1605   CREATININE 0.49 07/03/2019 1605   CREATININE 0.56 03/11/2019 1013   CALCIUM 8.9 07/03/2019 1605   PROT 7.7 06/09/2019 1727   ALBUMIN 4.7 06/09/2019 1727   AST 39 06/09/2019 1727   ALT 48 (H) 06/09/2019 1727   ALKPHOS 63 06/09/2019 1727   BILITOT 1.2 06/09/2019 1727   GFRNONAA >60 07/03/2019 1605   GFRNONAA 117 03/11/2019 1013   GFRAA >60 07/03/2019 1605   GFRAA 136 03/11/2019 1013   Lab Results  Component Value Date   CHOL 91 06/14/2019   HDL 26 (L) 06/14/2019   LDLCALC 39 06/14/2019   TRIG 180 (H) 06/14/2019   CHOLHDL 3.5 06/14/2019   Lab Results  Component Value Date   HGBA1C 11.9 (H) 06/14/2019   Lab Results  Component Value Date    VITAMINB12 670 04/17/2017   Lab Results  Component Value Date  TSH 1.60 03/11/2019    ASSESSMENT AND PLAN 39 y.o. year old female  has a past medical history of Breast mass (02/2017), Dental crown present, Hypertension, Migraines, Necrotizing pancreatitis (03/2017), Pituitary adenoma ([place]), Seasonal allergies, and Sleep apnea. here with:  1.  Migraine headache  She has had an increase in her headaches, possibly as result of her menstrual cycle returning.  I will increase her Flexeril to 10 mg at bedtime.  We could also increase the gabapentin if needed.  She will remain on metoprolol.  If her headaches do not improve, we may consider a CGRP medication.  Hopefully, the adjustment in medication, will prevent her from having to take daily ibuprofen, which can put her at risk of rebound headache.  She will follow-up here in 6 months or sooner if needed.   I spent 15 minutes with the patient. 50% of this time was spent discussing her plan of care.   Butler Denmark, AGNP-C, DNP 12/05/2019, 11:49 AM [facility] [address], [place], [place] [address] [phone]

## 2019-12-18 ENCOUNTER — Ambulatory Visit: Payer: BC Managed Care – PPO | Attending: Internal Medicine

## 2019-12-18 DIAGNOSIS — Z23 Encounter for immunization: Secondary | ICD-10-CM | POA: Insufficient documentation

## 2019-12-18 NOTE — Progress Notes (Signed)
   Covid-19 Vaccination Clinic  Name:  Kaylee Montgomery    MRN: [id number] DOB: Mar 15, 1981  12/18/2019  [name] was observed post Covid-19 immunization for 15 minutes without incidence. She was provided with Vaccine Information Sheet and instruction to access the V-Safe system.   [name] was instructed to call 911 with any severe reactions post vaccine: Marland Kitchen Difficulty breathing  . Swelling of your face and throat  . A fast heartbeat  . A bad rash all over your body  . Dizziness and weakness    Immunizations Administered    Name Date Dose VIS Date Route   Moderna COVID-19 Vaccine 12/18/2019  4:03 PM 0.5 mL 09/20/2019 Intramuscular   Manufacturer: Moderna   Lot: [id number]   [id number]

## 2020-01-21 ENCOUNTER — Ambulatory Visit: Payer: BC Managed Care – PPO | Attending: Internal Medicine

## 2020-01-21 DIAGNOSIS — Z23 Encounter for immunization: Secondary | ICD-10-CM

## 2020-01-21 NOTE — Progress Notes (Signed)
   Covid-19 Vaccination Clinic  Name:  Kaylee Montgomery    MRN: [id number] DOB: 12-19-1980  01/21/2020  [name] was observed post Covid-19 immunization for 15 minutes without incident. She was provided with Vaccine Information Sheet and instruction to access the V-Safe system.   [name] was instructed to call 911 with any severe reactions post vaccine: Marland Kitchen Difficulty breathing  . Swelling of face and throat  . A fast heartbeat  . A bad rash all over body  . Dizziness and weakness   Immunizations Administered    Name Date Dose VIS Date Route   Moderna COVID-19 Vaccine 01/21/2020  3:23 PM 0.5 mL 09/20/2019 Intramuscular   Manufacturer: Moderna   Lot: [id number]   [id number]

## 2020-01-24 NOTE — Progress Notes (Signed)
REVIEWED-NO ADDITIONAL RECOMMENDATIONS. 

## 2020-02-01 ENCOUNTER — Telehealth: Payer: Self-pay | Admitting: Family Medicine

## 2020-02-01 NOTE — Telephone Encounter (Signed)
pls reach out to pt. Npo evidence in EMR she is getting diabetes managed, Was referred to Dr Dorris Fetch , but not following there. IF she has Endo provider, no need to intervene, if not needs fasting lipid, cmp and EGFR and HBa1C asap, thanks

## 2020-02-02 NOTE — Telephone Encounter (Signed)
Called patient and left message for them to return call at the office   

## 2020-02-09 NOTE — Telephone Encounter (Signed)
Called patient and left message for them to return call at the office   

## 2020-02-11 IMAGING — CR DG CHEST 1V PORT
1 series · 1 of 1 positions shown · non-contrast
Comparison: Chest radiograph 04/18/2017

CLINICAL DATA: Chest pain

EXAM:
PORTABLE CHEST 1 VIEW

[portable]
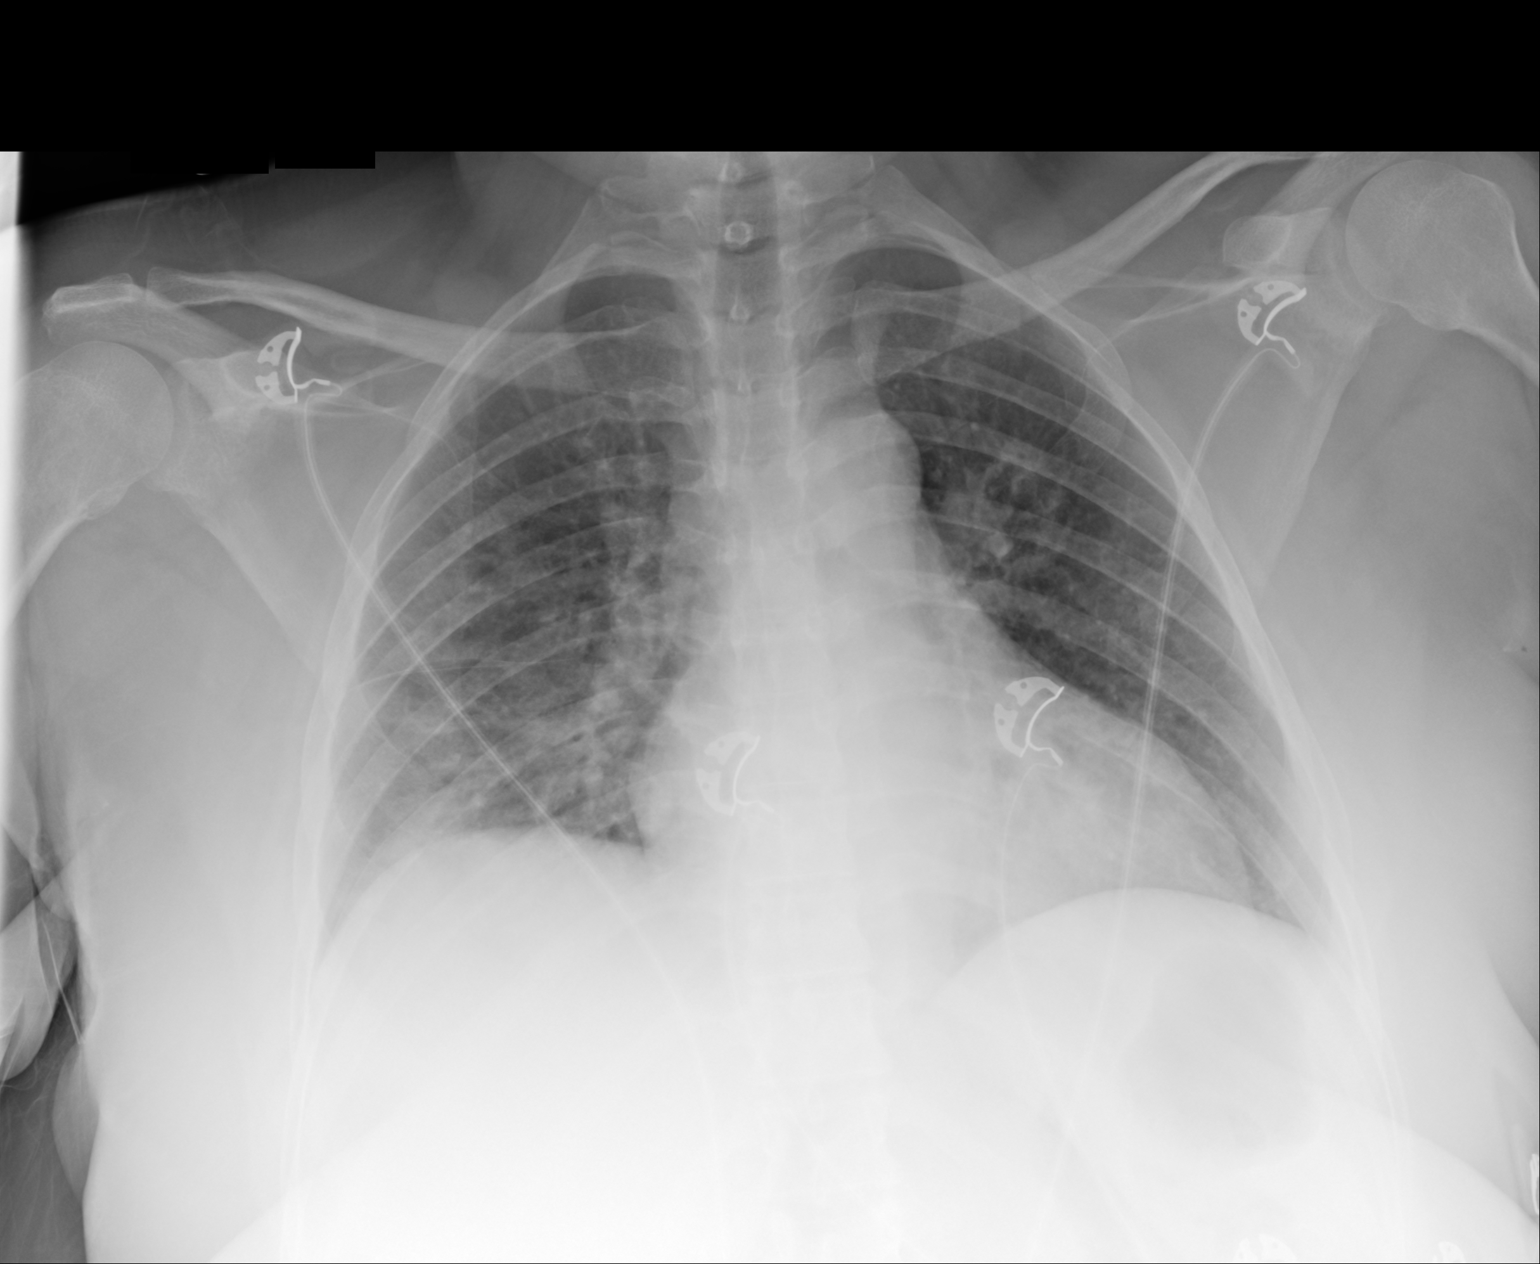

[1 of 1 positions shown; findings below may reference images not displayed]

FINDINGS: Monitoring leads overlie the patient. Stable cardiac and mediastinal
contours. Elevation the right hemidiaphragm. Heterogeneous opacities
right lung base. No pleural effusion or pneumothorax.
IMPRESSION: Heterogeneous opacities right lung base may represent atelectasis or
infection.

## 2020-02-20 NOTE — Telephone Encounter (Signed)
Was finally able to get patient on the phone and she said Dr Garwin Brothers referred her to an endocrinologist in [place] (a female Dr) dr Emeterio Reeve (maybe) wasn't sure but will call the office and have them send her records here.

## 2020-02-21 NOTE — Telephone Encounter (Signed)
Noted, as long as she is actually being treated that is good. Dr Renne Crigler is in the system so her labs and info should be already here, however I saw nothing. We have contacted the patient and followed up, which is the most important, she reports that she is being treated ( or was referred)

## 2020-02-24 ENCOUNTER — Other Ambulatory Visit: Payer: Self-pay | Admitting: Family Medicine

## 2020-04-04 ENCOUNTER — Other Ambulatory Visit: Payer: Self-pay

## 2020-04-04 ENCOUNTER — Encounter: Payer: Self-pay | Admitting: Family Medicine

## 2020-04-04 ENCOUNTER — Ambulatory Visit: Payer: BC Managed Care – PPO | Admitting: Family Medicine

## 2020-04-04 VITALS — BP 119/86 | HR 96 | Temp 98.2°F | Resp 15 | Ht 64.0 in | Wt 200.1 lb

## 2020-04-04 DIAGNOSIS — E1165 Type 2 diabetes mellitus with hyperglycemia: Secondary | ICD-10-CM

## 2020-04-04 DIAGNOSIS — E782 Mixed hyperlipidemia: Secondary | ICD-10-CM | POA: Diagnosis not present

## 2020-04-04 DIAGNOSIS — I10 Essential (primary) hypertension: Secondary | ICD-10-CM

## 2020-04-04 DIAGNOSIS — Z23 Encounter for immunization: Secondary | ICD-10-CM

## 2020-04-04 DIAGNOSIS — E139 Other specified diabetes mellitus without complications: Secondary | ICD-10-CM

## 2020-04-04 DIAGNOSIS — G43019 Migraine without aura, intractable, without status migrainosus: Secondary | ICD-10-CM

## 2020-04-04 DIAGNOSIS — B359 Dermatophytosis, unspecified: Secondary | ICD-10-CM

## 2020-04-04 DIAGNOSIS — E669 Obesity, unspecified: Secondary | ICD-10-CM

## 2020-04-04 DIAGNOSIS — E559 Vitamin D deficiency, unspecified: Secondary | ICD-10-CM | POA: Diagnosis not present

## 2020-04-04 DIAGNOSIS — B369 Superficial mycosis, unspecified: Secondary | ICD-10-CM | POA: Insufficient documentation

## 2020-04-04 MED ORDER — TERBINAFINE HCL 250 MG PO TABS: 250.0000 mg | ORAL_TABLET | Freq: Every day | ORAL | 0 refills | Status: AC

## 2020-04-04 MED ORDER — TERBINAFINE HCL 1 % EX CREA: TOPICAL_CREAM | CUTANEOUS | 1 refills | Status: DC

## 2020-04-04 MED ORDER — NYSTATIN 100000 UNIT/GM EX POWD
1.0000 | Freq: Three times a day (TID) | CUTANEOUS | 0 refills | Status: DC
Start: 2020-04-04 — End: 2021-09-11

## 2020-04-04 MED ORDER — FLUCONAZOLE 100 MG PO TABS
100.0000 mg | ORAL_TABLET | Freq: Every day | ORAL | 0 refills | Status: AC
Start: 2020-04-04 — End: 2020-04-09

## 2020-04-04 NOTE — Patient Instructions (Addendum)
Follow-up in office with MD in 4 months call if you need me sooner.  Medications are prescribed for the rash is on forearms as well as in the groin area.  2 tablets are prescribed for short-term use.  Powder to be applied to the groin is prescribed for as needed use.  And an antifungal cream is prescribed to be used on the forearm as well as in the groin for as needed use.  We will send for your latest Pap smear from your gynecologist Dr. Garwin Brothers.  Pneumonia 23 vaccine in the office today.  We will send for recent hemoglobin A1c and labs from your endocrinologist Dr. Soyla Murphy.  Fasting lipid, cmp and eGFR, cBC, TSH and vit D in the next 1 week, these are overdue  Microalb  Past due    It is important that you exercise regularly at least 30 minutes 5 times a week. If you develop chest pain, have severe difficulty breathing, or feel very tired, stop exercising immediately and seek medical attention   Think about what you will eat, plan ahead. Choose " clean, green, fresh or frozen" over canned, processed or packaged foods which are more sugary, salty and fatty. 70 to 75% of food eaten should be vegetables and fruit. Three meals at set times with snacks allowed between meals, but they must be fruit or vegetables. Aim to eat over a 12 hour period , example 7 am to 7 pm, and STOP after  your last meal of the day. Drink water,generally about 64 ounces per day, no other drink is as healthy. Fruit juice is best enjoyed in a healthy way, by EATING the fruit. Thanks for choosing [facility], we consider it a privelige to serve you.

## 2020-04-04 NOTE — Assessment & Plan Note (Signed)
Managed by Endo, need updated info

## 2020-04-04 NOTE — Progress Notes (Signed)
Kaylee Montgomery     MRN: [id number]      DOB: December 18, 1980   HPI Ms. Ogden is here for follow up and re-evaluation of chronic medical conditions, medication management and review of any available recent lab and radiology data.  Preventive health is updated, specifically  Cancer screening and Immunization.   Questions or concerns regarding consultations or procedures which the PT has had in the interim are  Addressed.Is now being followed by Endo and reports a recent HBA1C of under 7 The PT denies any adverse reactions to current medications since the last visit.  C/o itchy red rash in left groin and also scaly hyperpigmented rash on both forearms   ROS Denies recent fever or chills. Denies sinus pressure, nasal congestion, ear pain or sore throat. Denies chest congestion, productive cough or wheezing. Denies chest pains, palpitations and leg swelling Denies abdominal pain, nausea, vomiting,diarrhea or constipation.   Denies dysuria, frequency, hesitancy or incontinence. Denies joint pain, swelling and limitation in mobility. Denies headaches, seizures, numbness, or tingling. Denies depression, anxiety or insomnia. Denies skin break down or rash.   PE  BP 119/86   Pulse 96   Temp 98.2 F (36.8 C) (Temporal)   Resp 15   Ht 5\' 4"  (1.626 m)   Wt 200 lb 1.3 oz (90.8 kg)   SpO2 99%   BMI 34.34 kg/m    Patient alert and oriented and in no cardiopulmonary distress.  HEENT: No facial asymmetry, EOMI,     Neck supple .  Chest: Clear to auscultation bilaterally.  CVS: S1, S2 no murmurs, no S3.Regular rate.  ABD: Soft non tender.   Ext: No edema  MS: Adequate ROM spine, shoulders, hips and knees.  Skin: Erythematous rash on left groin with satellite lesions, also macular scaly hyperpigmented rash on both forearms.  Psych: Good eye contact, normal affect. Memory intact not anxious or depressed appearing.  CNS: CN 2-12 intact, power,  normal throughout.no focal deficits  noted.   Assessment & Plan  Essential hypertension, benign Sub optimal control, high diastolic DASH diet and commitment to daily physical activity for a minimum of 30 minutes discussed and encouraged, as a part of hypertension management. The importance of attaining a healthy weight is also discussed.  BP/Weight 04/04/2020 12/05/2019 11/01/2019 07/13/2019 07/03/2019 06/14/2019 04/08/3558  Systolic BP 741 638 453 646 803 212 248  Diastolic BP 86 94 78 78 85 78 80  Wt. (Lbs) 200.08 196.6 196 197 204 205 203  BMI 34.34 33.75 33.64 33.81 35.02 35.19 34.84       Obesity (BMI 30.0-34.9)  Patient re-educated about  the importance of commitment to a  minimum of 150 minutes of exercise per week as able.  The importance of healthy food choices with portion control discussed, as well as eating regularly and within a 12 hour window most days. The need to choose "clean , green" food 50 to 75% of the time is discussed, as well as to make water the primary drink and set a goal of 64 ounces water daily.    Weight /BMI 04/04/2020 12/05/2019 11/01/2019  WEIGHT 200 lb 1.3 oz 196 lb 9.6 oz 196 lb  HEIGHT 5\' 4"  5\' 4"  5\' 4"   BMI 34.34 kg/m2 33.75 kg/m2 33.64 kg/m2      Common migraine with intractable migraine Controlled well with prophylactic gabapentin and metoprolol, uses ibuprofen for acute headache. Reduced frequency on current regime  Uncontrolled type 2 diabetes mellitus with hyperglycemia ([place]) Managed by Endo, need  updated info  Mixed hyperlipidemia Hyperlipidemia:Low fat diet discussed and encouraged.   Lipid Panel  Lab Results  Component Value Date   CHOL 91 06/14/2019   HDL 26 (L) 06/14/2019   LDLCALC 39 06/14/2019   TRIG 180 (H) 06/14/2019   CHOLHDL 3.5 06/14/2019     Updated lab needed .   Dermatomycosis Inguinal region, left greater than right. Oral fluconazole and topical nystatin , then use cornstarch as drying agent  Tinea Affecting forearms and groin. Oral  terbinafine and topical cream also  Need for 23-polyvalent pneumococcal polysaccharide vaccine After obtaining informed consent, the vaccine is  administered , with no adverse effect noted at the time of administration.

## 2020-04-04 NOTE — Assessment & Plan Note (Signed)
Controlled well with prophylactic gabapentin and metoprolol, uses ibuprofen for acute headache. Reduced frequency on current regime

## 2020-04-04 NOTE — Assessment & Plan Note (Signed)
Inguinal region, left greater than right. Oral fluconazole and topical nystatin , then use cornstarch as drying agent

## 2020-04-04 NOTE — Assessment & Plan Note (Signed)
Sub optimal control, high diastolic DASH diet and commitment to daily physical activity for a minimum of 30 minutes discussed and encouraged, as a part of hypertension management. The importance of attaining a healthy weight is also discussed.  BP/Weight 04/04/2020 12/05/2019 11/01/2019 07/13/2019 07/03/2019 06/14/2019 2/84/1324  Systolic BP 401 027 253 664 403 474 259  Diastolic BP 86 94 78 78 85 78 80  Wt. (Lbs) 200.08 196.6 196 197 204 205 203  BMI 34.34 33.75 33.64 33.81 35.02 35.19 34.84

## 2020-04-04 NOTE — Assessment & Plan Note (Signed)
Hyperlipidemia:Low fat diet discussed and encouraged.   Lipid Panel  Lab Results  Component Value Date   CHOL 91 06/14/2019   HDL 26 (L) 06/14/2019   LDLCALC 39 06/14/2019   TRIG 180 (H) 06/14/2019   CHOLHDL 3.5 06/14/2019     Updated lab needed .

## 2020-04-04 NOTE — Assessment & Plan Note (Signed)
After obtaining informed consent, the vaccine is  administered , with no adverse effect noted at the time of administration.  

## 2020-04-04 NOTE — Assessment & Plan Note (Signed)
  Patient re-educated about  the importance of commitment to a  minimum of 150 minutes of exercise per week as able.  The importance of healthy food choices with portion control discussed, as well as eating regularly and within a 12 hour window most days. The need to choose "clean , green" food 50 to 75% of the time is discussed, as well as to make water the primary drink and set a goal of 64 ounces water daily.    Weight /BMI 04/04/2020 12/05/2019 11/01/2019  WEIGHT 200 lb 1.3 oz 196 lb 9.6 oz 196 lb  HEIGHT 5\' 4"  5\' 4"  5\' 4"   BMI 34.34 kg/m2 33.75 kg/m2 33.64 kg/m2

## 2020-04-04 NOTE — Assessment & Plan Note (Signed)
Affecting forearms and groin. Oral terbinafine and topical cream also

## 2020-04-08 ENCOUNTER — Other Ambulatory Visit: Payer: Self-pay | Admitting: Family Medicine

## 2020-04-08 DIAGNOSIS — E559 Vitamin D deficiency, unspecified: Secondary | ICD-10-CM

## 2020-05-21 ENCOUNTER — Encounter: Payer: Self-pay | Admitting: Family Medicine

## 2020-05-24 ENCOUNTER — Ambulatory Visit: Payer: BC Managed Care – PPO | Admitting: Neurology

## 2020-05-24 ENCOUNTER — Encounter: Payer: Self-pay | Admitting: Neurology

## 2020-05-24 NOTE — Progress Notes (Deleted)
PATIENT: Kaylee Montgomery DOB: [birth year]  REASON FOR VISIT: follow up HISTORY FROM: patient  HISTORY OF PRESENT ILLNESS: Today 05/24/20 Kaylee Montgomery is a 39 year old female with history of migraine headache.  She is on Flexeril, metoprolol, gabapentin.  She will take Tylenol for acute headache.  She cannot take NSAID secondary to pancreatitis.  At last visit, had increase in headache, Flexeril was increased 10 mg qhs. HISTORY  12/05/2019 SS: Kaylee Montgomery is a 39 year old female with history of migraine headache.  She is currently taking Flexeril, metoprolol, and gabapentin.  She takes Tylenol if needed for headache.  She says she cannot take NSAID medication secondary to recurring bouts of pancreatitis.  She reports recently her headaches have increased, has been having daily moderate headache, she thinks likely related to return of her menstrual cycle.  She had MRI of the brain in December for evaluation of pituitary tumor, showed no finding of residual or recurrent pituitary microadenoma or macroadenoma.  The pituitary gland is normal, except for a 2 mm pats intermedia cyst.  For her headaches, she has been taking 800 mg daily ibuprofen.  She indicates the change in weather is a trigger for her headache.  She complains of occipital headache, does not radiate forward, tension in her neck, also frontal headache in between her eyes.  She denies sensitivity to light or sound.  She presents today for evaluation unaccompanied  REVIEW OF SYSTEMS: Out of a complete 14 system review of symptoms, the patient complains only of the following symptoms, and all other reviewed systems are negative.  ALLERGIES: Allergies  Allergen Reactions  . Bromocriptine Mesylate Nausea Only  . Depo-Medrol [Methylprednisolone Sodium Succ] Itching and Rash  . Dilaudid [Hydromorphone Hcl] Itching  . Imitrex [Sumatriptan] Rash    HOME MEDICATIONS: Outpatient Medications Prior to Visit  Medication Sig Dispense Refill   . acetaminophen (TYLENOL) 500 MG tablet Take 1,000 mg by mouth every 6 (six) hours as needed for mild pain or moderate pain.     Marland Kitchen amLODipine (NORVASC) 10 MG tablet TAKE 1 TABLET(10 MG) BY MOUTH DAILY 90 tablet 3  . atorvastatin (LIPITOR) 10 MG tablet Take 1 tablet (10 mg total) by mouth daily. 90 tablet 3  . cyclobenzaprine (FLEXERIL) 10 MG tablet Take 1 tablet (10 mg total) by mouth at bedtime. 90 tablet 1  . Empagliflozin-metFORMIN HCl ER (SYNJARDY XR) 12.02-999 MG TB24 Take 1 tablet by mouth 2 (two) times daily.    Marland Kitchen gabapentin (NEURONTIN) 300 MG capsule Take 1 capsule (300 mg total) by mouth daily. 90 capsule 3  . glucose blood (ONETOUCH VERIO) test strip Use as instructed bid. E11.65 100 each 5  . Lancets MISC 1 each by Does not apply route 2 (two) times daily. 100 each 5  . Metoprolol Tartrate 75 MG TABS Take 75 mg by mouth 2 (two) times daily. 180 tablet 3  . NORLYDA 0.35 MG tablet Take 1 tablet by mouth daily.    Marland Kitchen nystatin (MYCOSTATIN/NYSTOP) powder Apply 1 application topically 3 (three) times daily. Apply twice daily;y to rash in groin for 10 days, then as needed 45 g 0  . terbinafine (LAMISIL) 1 % cream Apply twice daily for 10 days to rash on forearms and in  groin , then as needed 42 g 1  . Vitamin D, Ergocalciferol, (DRISDOL) 1.25 MG (50000 UNIT) CAPS capsule TAKE 1 CAPSULE BY MOUTH EVERY 7 DAYS 12 capsule 1   No facility-administered medications prior to visit.    PAST MEDICAL  HISTORY: Past Medical History:  Diagnosis Date  . Breast mass 02/2017  . Dental crown present   . Hypertension    states BP fluctuates; has been on med. x 1 yr.  . Migraines   . 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 Pituitary adenoma ([place])   . Seasonal allergies   . Sleep apnea    no CPAP use in > 6 mos.    PAST SURGICAL HISTORY: Past Surgical History:  Procedure Laterality Date  . BREAST EXCISIONAL BIOPSY Left 2018   Negative  . BREAST LUMPECTOMY WITH RADIOACTIVE  SEED LOCALIZATION Left 03/05/2017   Procedure: LEFT BREAST LUMPECTOMY WITH RADIOACTIVE SEED LOCALIZATION;  Surgeon: Donnie Mesa, MD;  Location: [place];  Service: General;  Laterality: Left;  . CESAREAN SECTION    . CHOLECYSTECTOMY  08/2017   [facility]  . ESOPHAGOGASTRODUODENOSCOPY  07/21/2017   [facility]: Dr. Delrae Alfred. healthy appearing cyst cavity without necrotic material that was decreased in size. Removal of stent scheduled for 11/12.   Marland Kitchen EUS  06/19/2017   [facility]: 7 x 7.3 cm cyst with necrotic material noted adjacent to the body of the pancreas. FNA without malignancy.  Marland Kitchen LAPAROSCOPIC GASTRIC SLEEVE RESECTION N/A 02/18/2016   Procedure: LAPAROSCOPIC GASTRIC SLEEVE RESECTION, UPPER ENDO;  Surgeon: Greer Pickerel, MD;  Location: [facility];  Service: General;  Laterality: N/A;  . 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 TONSILLECTOMY  age 53    FAMILY HISTORY: Family History  Problem Relation Age of Onset  . Heart disease Father        stent  . Migraines Father   . Migraines Son   . Diabetes Maternal Grandfather   . Colon cancer Neg Hx     SOCIAL HISTORY: Social History   Socioeconomic History  . Marital status: Single    Spouse name: Not on file  . Number of children: 1  . Years of education: Not on file  . Highest education level: Not on file  Occupational History  . Occupation: STUDENT   . Occupation: CASEWORKER    Employer: [employer]  Tobacco Use  . Smoking status: Never Smoker  . Smokeless tobacco: Never Used  Vaping Use  . Vaping Use: Never used  Substance and Sexual Activity  . Alcohol use: No    Alcohol/week: 0.0 standard drinks    Comment: never drank regularly or heavily but NO etoh at all since 01/2017  . Drug use: No  . Sexual activity: Not on file  Other Topics Concern  . Not on file  Social History Narrative   Patient is right handed.   Patient drinks 1-2  cups of caffeine daily.   Social Determinants of Health   Financial Resource Strain:   . Difficulty of Paying Living Expenses:   Food Insecurity:   . Worried About Charity fundraiser in the Last Year:   . Arboriculturist in the Last Year:   Transportation Needs:   . Film/video editor (Medical):   Marland Kitchen Lack of Transportation (Non-Medical):   Physical Activity:   . Days of Exercise per Week:   . Minutes of Exercise per Session:   Stress:   . Feeling of Stress :   Social Connections:   . Frequency of Communication with Friends and Family:   . Frequency of Social Gatherings with Friends and Family:   . Attends Religious Services:   .  Active Member of Clubs or Organizations:   . Attends Archivist Meetings:   Marland Kitchen Marital Status:   Intimate Partner Violence:   . Fear of Current or Ex-Partner:   . Emotionally Abused:   Marland Kitchen Physically Abused:   . Sexually Abused:       PHYSICAL EXAM  There were no vitals filed for this visit. There is no height or weight on file to calculate BMI.  Generalized: Well developed, in no acute distress   Neurological examination  Mentation: Alert oriented to time, place, history taking. Follows all commands speech and language fluent Cranial nerve II-XII: Pupils were equal round reactive to light. Extraocular movements were full, visual field were full on confrontational test. Facial sensation and strength were normal. Uvula tongue midline. Head turning and shoulder shrug  were normal and symmetric. Motor: The motor testing reveals 5 over 5 strength of all 4 extremities. Good symmetric motor tone is noted throughout.  Sensory: Sensory testing is intact to soft touch on all 4 extremities. No evidence of extinction is noted.  Coordination: Cerebellar testing reveals good finger-nose-finger and heel-to-shin bilaterally.  Gait and station: Gait is normal. Tandem gait is normal. Romberg is negative. No drift is seen.  Reflexes: Deep tendon  reflexes are symmetric and normal bilaterally.   DIAGNOSTIC DATA (LABS, IMAGING, TESTING) - I reviewed patient records, labs, notes, testing and imaging myself where available.  Lab Results  Component Value Date   WBC 3.1 (L) 07/03/2019   HGB 12.3 07/03/2019   HCT 38.8 07/03/2019   MCV 78.2 (L) 07/03/2019   PLT 313 07/03/2019      Component Value Date/Time   NA 137 07/03/2019 1605   K 3.4 (L) 07/03/2019 1605   CL 104 07/03/2019 1605   CO2 21 (L) 07/03/2019 1605   GLUCOSE 118 (H) 07/03/2019 1605   BUN 6 07/03/2019 1605   CREATININE 0.49 07/03/2019 1605   CREATININE 0.56 03/11/2019 1013   CALCIUM 8.9 07/03/2019 1605   PROT 7.7 06/09/2019 1727   ALBUMIN 4.7 06/09/2019 1727   AST 39 06/09/2019 1727   ALT 48 (H) 06/09/2019 1727   ALKPHOS 63 06/09/2019 1727   BILITOT 1.2 06/09/2019 1727   GFRNONAA >60 07/03/2019 1605   GFRNONAA 117 03/11/2019 1013   GFRAA >60 07/03/2019 1605   GFRAA 136 03/11/2019 1013   Lab Results  Component Value Date   CHOL 91 06/14/2019   HDL 26 (L) 06/14/2019   LDLCALC 39 06/14/2019   TRIG 180 (H) 06/14/2019   CHOLHDL 3.5 06/14/2019   Lab Results  Component Value Date   HGBA1C 11.9 (H) 06/14/2019   Lab Results  Component Value Date   VITAMINB12 670 04/17/2017   Lab Results  Component Value Date   TSH 1.60 03/11/2019      ASSESSMENT AND PLAN 39 y.o. year old female  has a past medical history of Breast mass (02/2017), Dental crown present, Hypertension, Migraines, Necrotizing pancreatitis (03/2017), Pituitary adenoma ([place]), Seasonal allergies, and Sleep apnea. here with ***   I spent 15 minutes with the patient. 50% of this time was spent   Butler Denmark, [place], DNP 05/24/2020, 6:06 AM [facility] [address], [place], [place] [address] [phone]

## 2020-06-04 ENCOUNTER — Telehealth: Payer: Self-pay | Admitting: *Deleted

## 2020-06-04 DIAGNOSIS — M542 Cervicalgia: Secondary | ICD-10-CM

## 2020-06-04 MED ORDER — CYCLOBENZAPRINE HCL 10 MG PO TABS: 10.0000 mg | ORAL_TABLET | Freq: Every day | ORAL | 1 refills | Status: DC

## 2020-06-04 NOTE — Telephone Encounter (Signed)
Refill done.  

## 2020-06-05 ENCOUNTER — Encounter: Payer: Self-pay | Admitting: Family Medicine

## 2020-06-06 ENCOUNTER — Ambulatory Visit: Payer: BC Managed Care – PPO | Admitting: Neurology

## 2020-06-14 ENCOUNTER — Telehealth: Payer: Self-pay

## 2020-06-14 ENCOUNTER — Other Ambulatory Visit: Payer: Self-pay

## 2020-06-14 MED ORDER — NORLYDA 0.35 MG PO TABS: 1.0000 | ORAL_TABLET | Freq: Every day | ORAL | 5 refills | Status: DC

## 2020-06-14 NOTE — Telephone Encounter (Signed)
Patient aware that birth control was sent

## 2020-06-14 NOTE — Telephone Encounter (Signed)
Pt is calling lvm that she sent a Mychart and hasnt heard anything regarding the note

## 2020-06-14 NOTE — Telephone Encounter (Signed)
Please see note below --thanks  Signed        Called pt back, apologized for the delay.  She is scheduled for 9-16 cpe w\pap. However she needs her Birth Control now., Can this be called in to [facility] in [place].  Please call to advise

## 2020-06-14 NOTE — Telephone Encounter (Signed)
Called pt back, apologized for the delay.  She is scheduled for 9-16 cpe w\pap. However she needs her Birth Control now., Can this be called in to [facility] in [place].  Please call to advise

## 2020-07-05 ENCOUNTER — Encounter: Payer: Self-pay | Admitting: Family Medicine

## 2020-07-05 ENCOUNTER — Other Ambulatory Visit (HOSPITAL_COMMUNITY)
Admission: RE | Admit: 2020-07-05 | Discharge: 2020-07-05 | Disposition: A | Discharge status: Home / Self Care | Payer: BC Managed Care – PPO | Source: Ambulatory Visit | Attending: Family Medicine | Admitting: Family Medicine

## 2020-07-05 ENCOUNTER — Ambulatory Visit (INDEPENDENT_AMBULATORY_CARE_PROVIDER_SITE_OTHER): Payer: BC Managed Care – PPO | Admitting: Family Medicine

## 2020-07-05 ENCOUNTER — Other Ambulatory Visit: Payer: Self-pay

## 2020-07-05 VITALS — BP 111/78 | HR 95 | Resp 16 | Ht 64.0 in | Wt 203.1 lb

## 2020-07-05 DIAGNOSIS — E782 Mixed hyperlipidemia: Secondary | ICD-10-CM

## 2020-07-05 DIAGNOSIS — E669 Obesity, unspecified: Secondary | ICD-10-CM

## 2020-07-05 DIAGNOSIS — E1159 Type 2 diabetes mellitus with other circulatory complications: Secondary | ICD-10-CM | POA: Diagnosis not present

## 2020-07-05 DIAGNOSIS — Z124 Encounter for screening for malignant neoplasm of cervix: Secondary | ICD-10-CM

## 2020-07-05 DIAGNOSIS — Z0000 Encounter for general adult medical examination without abnormal findings: Secondary | ICD-10-CM

## 2020-07-05 DIAGNOSIS — I152 Hypertension secondary to endocrine disorders: Secondary | ICD-10-CM

## 2020-07-05 DIAGNOSIS — Z23 Encounter for immunization: Secondary | ICD-10-CM

## 2020-07-05 DIAGNOSIS — I10 Essential (primary) hypertension: Secondary | ICD-10-CM

## 2020-07-05 DIAGNOSIS — N760 Acute vaginitis: Secondary | ICD-10-CM | POA: Insufficient documentation

## 2020-07-05 DIAGNOSIS — Z0001 Encounter for general adult medical examination with abnormal findings: Secondary | ICD-10-CM

## 2020-07-05 DIAGNOSIS — N926 Irregular menstruation, unspecified: Secondary | ICD-10-CM

## 2020-07-05 MED ORDER — ATORVASTATIN CALCIUM 20 MG PO TABS
20.0000 mg | ORAL_TABLET | Freq: Every day | ORAL | 3 refills | Status: DC
Start: 2020-07-05 — End: 2021-08-26

## 2020-07-05 NOTE — Assessment & Plan Note (Signed)
Hyperlipidemia:Low fat diet discussed and encouraged.   Uncontrolled with increased cV risk in 05/2020, increase dose of lipitor, anmd lower fat intake, review in 4 months

## 2020-07-05 NOTE — Patient Instructions (Addendum)
F/U in office with MD in 4 months, call if you need me sooner  Flu vaccine today  Mammogram due in January to be scheduled at checkout   Increase atorvastatin dose to 20 mg daILY AND REDUCE FRIED AND FATTY FOODS AS WELL AS COMMIT TO REGULAR EXERCISE  Start asprin 81 mg once daily to reduce heart disease risk  It is important that you exercise regularly at least 30 minutes 5 times a week. If you develop chest pain, have severe difficulty breathing, or feel very tired, stop exercising immediately and seek medical attention   Microalb from office today  You are referred for eye exam which is overdue  Foot exam today is  Good  Recent labs showed low potassium 9 slightly) list of potassium rich foods is provided     Hypokalemia Hypokalemia means that the amount of potassium in the blood is lower than normal. Potassium is a chemical (electrolyte) that helps regulate the amount of fluid in the body. It also stimulates muscle tightening (contraction) and helps nerves work properly. Normally, most of the body's potassium is inside cells, and only a very small amount is in the blood. Because the amount in the blood is so small, minor changes to potassium levels in the blood can be life-threatening. What are the causes? This condition may be caused by:  Antibiotic medicine.  Diarrhea or vomiting. Taking too much of a medicine that helps you have a bowel movement (laxative) can cause diarrhea and lead to hypokalemia.  Chronic kidney disease (CKD).  Medicines that help the body get rid of excess fluid (diuretics).  Eating disorders, such as bulimia.  Low magnesium levels in the body.  Sweating a lot. What are the signs or symptoms? Symptoms of this condition include:  Weakness.  Constipation.  Fatigue.  Muscle cramps.  Mental confusion.  Skipped heartbeats or irregular heartbeat (palpitations).  Tingling or numbness. How is this diagnosed? This condition is diagnosed  with a blood test. How is this treated? This condition may be treated by:  Taking potassium supplements by mouth.  Adjusting the medicines that you take.  Eating more foods that contain a lot of potassium. If your potassium level is very low, you may need to get potassium through an IV and be monitored in the hospital. Follow these instructions at home:   Take over-the-counter and prescription medicines only as told by your health care provider. This includes vitamins and supplements.  Eat a healthy diet. A healthy diet includes fresh fruits and vegetables, whole grains, healthy fats, and lean proteins.  If instructed, eat more foods that contain a lot of potassium. This includes: ? Nuts, such as peanuts and pistachios. ? Seeds, such as sunflower seeds and pumpkin seeds. ? Peas, lentils, and lima beans. ? Whole grain and bran cereals and breads. ? Fresh fruits and vegetables, such as apricots, avocado, bananas, cantaloupe, kiwi, oranges, tomatoes, asparagus, and potatoes. ? Orange juice. ? Tomato juice. ? Red meats. ? Yogurt.  Keep all follow-up visits as told by your health care provider. This is important. Contact a health care provider if you:  Have weakness that gets worse.  Feel your heart pounding or racing.  Vomit.  Have diarrhea.  Have diabetes (diabetes mellitus) and you have trouble keeping your blood sugar (glucose) in your target range. Get help right away if you:  Have chest pain.  Have shortness of breath.  Have vomiting or diarrhea that lasts for more than 2 days.  Faint. Summary  Hypokalemia means that the amount of potassium in the blood is lower than normal.  This condition is diagnosed with a blood test.  Hypokalemia may be treated by taking potassium supplements, adjusting the medicines that you take, or eating more foods that are high in potassium.  If your potassium level is very low, you may need to get potassium through an IV and be  monitored in the hospital. This information is not intended to replace advice given to you by your health care provider. Make sure you discuss any questions you have with your health care provider. Document Revised: 05/19/2018 Document Reviewed: 05/19/2018 Elsevier Patient Education  [place].

## 2020-07-05 NOTE — Assessment & Plan Note (Signed)
Kaylee Montgomery is reminded of the importance of commitment to daily physical activity for 30 minutes or more, as able and the need to limit carbohydrate intake to 30 to 60 grams per meal to help with blood sugar control.   The need to take medication as prescribed, test blood sugar as directed, and to call between visits if there is a concern that blood sugar is uncontrolled is also discussed.   Kaylee Montgomery is reminded of the importance of daily foot exam, annual eye examination, and good blood sugar, blood pressure and cholesterol control.  Diabetic Labs Latest Ref Rng & Units 07/03/2019 06/14/2019 06/09/2019 03/11/2019 06/30/2018  HbA1c <5.7 % of total Hgb - 11.9(H) - 7.7(H) 6.3(H)  Microalbumin mg/dL - - - 5.6 -  Micro/Creat Ratio <30 mcg/mg creat - - - 48(H) -  Chol <200 mg/dL - 91 - 203(H) -  HDL > OR = 50 mg/dL - 26(L) - 40(L) -  Calc LDL mg/dL (calc) - 39 - 135(H) -  Triglycerides <150 mg/dL - 180(H) - 149 -  Creatinine 0.44 - 1.00 mg/dL 0.49 - 0.61 0.56 0.68   BP/Weight 07/05/2020 04/04/2020 12/05/2019 11/01/2019 07/13/2019 07/03/2019 0/01/5996  Systolic BP 741 423 953 202 334 356 861  Diastolic BP 78 86 94 78 78 85 78  Wt. (Lbs) 203.12 200.08 196.6 196 197 204 205  BMI 34.87 34.34 33.75 33.64 33.81 35.02 35.19   Foot/eye exam completion dates 07/05/2020 05/25/2019  Foot Form Completion Done Done

## 2020-07-05 NOTE — Assessment & Plan Note (Signed)
Irregular menses, refer to [facility] , Dr Garwin Brothers

## 2020-07-05 NOTE — Progress Notes (Signed)
Kaylee Montgomery     MRN: [id number]      DOB: 02-Feb-1981  HPI: Patient is in for annual physical exam. Requests STD screening. Recent labs, if available are reviewed.Dose increase in atorvastatin and needs to start aspirin Immunization is reviewed , and  updatedd.   PE: BP 111/78   Pulse 95   Resp 16   Ht 5\' 4"  (1.626 m)   Wt 203 lb 1.9 oz (92.1 kg)   SpO2 98%   BMI 34.87 kg/m  Pleasant  female, alert and oriented x 3, in no cardio-pulmonary distress. Afebrile. HEENT No facial trauma or asymetry. Sinuses non tender.  Extra occullar muscles intact.. External ears normal, . Neck: supple, no adenopathy,JVD or thyromegaly.No bruits.  Chest: Clear to ascultation bilaterally.No crackles or wheezes. Non tender to palpation  Breast: No asymetry,no masses or lumps. No tenderness. No nipple discharge or inversion. No axillary or supraclavicular adenopathy  Cardiovascular system; Heart sounds normal,  S1 and  S2 ,no S3.  No murmur, or thrill. Apical beat not displaced Peripheral pulses normal.  Abdomen: Soft, non tender, no organomegaly or masses. No bruits. Bowel sounds normal. No guarding, tenderness or rebound.   GU: External genitalia normal female genitalia , normal female distribution of hair. No lesions. Urethral meatus normal in size, no  Prolapse, no lesions visibly  Present. Bladder non tender. Vagina pink and moist , with no visible lesions , discharge present . Adequate pelvic support no  cystocele or rectocele noted Cervix pink and appears healthy, no lesions or ulcerations noted, no discharge noted from os Uterus normal size, no adnexal masses, no cervical motion or adnexal tenderness.   Musculoskeletal exam: Full ROM of spine, hips , shoulders and knees. No deformity ,swelling or crepitus noted. No muscle wasting or atrophy.   Neurologic: Cranial nerves 2 to 12 intact. Power, tone ,sensation and reflexes normal throughout. No disturbance in  gait. No tremor.  Skin: Intact, no ulceration, erythema , scaling or rash noted. Pigmentation normal throughout  Psych; Normal mood and affect. Judgement and concentration normal   Assessment & Plan:  Diabetes mellitus [facility]) Ms. Depinto is reminded of the importance of commitment to daily physical activity for 30 minutes or more, as able and the need to limit carbohydrate intake to 30 to 60 grams per meal to help with blood sugar control.   The need to take medication as prescribed, test blood sugar as directed, and to call between visits if there is a concern that blood sugar is uncontrolled is also discussed.   Ms. Fujimoto is reminded of the importance of daily foot exam, annual eye examination, and good blood sugar, blood pressure and cholesterol control.  Diabetic Labs Latest Ref Rng & Units 07/03/2019 06/14/2019 06/09/2019 03/11/2019 06/30/2018  HbA1c <5.7 % of total Hgb - 11.9(H) - 7.7(H) 6.3(H)  Microalbumin mg/dL - - - 5.6 -  Micro/Creat Ratio <30 mcg/mg creat - - - 48(H) -  Chol <200 mg/dL - 91 - 203(H) -  HDL > OR = 50 mg/dL - 26(L) - 40(L) -  Calc LDL mg/dL (calc) - 39 - 135(H) -  Triglycerides <150 mg/dL - 180(H) - 149 -  Creatinine 0.44 - 1.00 mg/dL 0.49 - 0.61 0.56 0.68   BP/Weight 07/05/2020 04/04/2020 12/05/2019 11/01/2019 07/13/2019 07/03/2019 3/78/5885  Systolic BP 027 741 287 867 672 094 709  Diastolic BP 78 86 94 78 78 85 78  Wt. (Lbs) 203.12 200.08 196.6 196 197 204 205  BMI 34.87 34.34  33.75 33.64 33.81 35.02 35.19   Foot/eye exam completion dates 07/05/2020 05/25/2019  Foot Form Completion Done Done        Annual physical exam Annual exam as documented. Counseling done  re healthy lifestyle involving commitment to 150 minutes exercise per week, heart healthy diet, and attaining healthy weight.The importance of adequate sleep also discussed. Regular seat belt use and home safety, is also discussed. Changes in health habits are decided on by the patient with goals  and time frames  set for achieving them. Immunization and cancer screening needs are specifically addressed at this visit.

## 2020-07-05 NOTE — Assessment & Plan Note (Signed)
  Patient re-educated about  the importance of commitment to a  minimum of 150 minutes of exercise per week as able.  The importance of healthy food choices with portion control discussed, as well as eating regularly and within a 12 hour window most days. The need to choose "clean , green" food 50 to 75% of the time is discussed, as well as to make water the primary drink and set a goal of 64 ounces water daily.    Weight /BMI 07/05/2020 04/04/2020 12/05/2019  WEIGHT 203 lb 1.9 oz 200 lb 1.3 oz 196 lb 9.6 oz  HEIGHT 5\' 4"  5\' 4"  5\' 4"   BMI 34.87 kg/m2 34.34 kg/m2 33.75 kg/m2

## 2020-07-05 NOTE — Assessment & Plan Note (Signed)

## 2020-07-06 ENCOUNTER — Other Ambulatory Visit (HOSPITAL_COMMUNITY)
Admission: RE | Admit: 2020-07-06 | Discharge: 2020-07-06 | Disposition: A | Discharge status: Home / Self Care | Payer: BC Managed Care – PPO | Source: Other Acute Inpatient Hospital | Attending: Family Medicine | Admitting: Family Medicine

## 2020-07-06 DIAGNOSIS — E1165 Type 2 diabetes mellitus with hyperglycemia: Secondary | ICD-10-CM | POA: Insufficient documentation

## 2020-07-07 LAB — MICROALBUMIN / CREATININE URINE RATIO
Creatinine, Urine: 52.8 mg/dL
Microalb Creat Ratio: 38 mg/g creat — ABNORMAL HIGH (ref 0–29)
Microalb, Ur: 19.9 ug/mL — ABNORMAL HIGH

## 2020-07-09 LAB — CERVICOVAGINAL ANCILLARY ONLY
Bacterial Vaginitis (gardnerella): NEGATIVE
Chlamydia: NEGATIVE
Comment: NEGATIVE
Comment: NEGATIVE
Comment: NEGATIVE
Comment: NORMAL
Neisseria Gonorrhea: NEGATIVE
Trichomonas: NEGATIVE

## 2020-07-09 LAB — CYTOLOGY - PAP
Adequacy: ABSENT
Comment: NEGATIVE
Diagnosis: NEGATIVE
High risk HPV: NEGATIVE

## 2020-08-07 ENCOUNTER — Ambulatory Visit: Payer: BC Managed Care – PPO | Admitting: Family Medicine

## 2020-11-08 ENCOUNTER — Telehealth: Payer: BC Managed Care – PPO | Admitting: Family Medicine

## 2020-11-12 ENCOUNTER — Other Ambulatory Visit: Payer: Self-pay

## 2020-11-12 DIAGNOSIS — E1159 Type 2 diabetes mellitus with other circulatory complications: Secondary | ICD-10-CM

## 2020-11-12 DIAGNOSIS — E669 Obesity, unspecified: Secondary | ICD-10-CM

## 2020-11-12 DIAGNOSIS — I10 Essential (primary) hypertension: Secondary | ICD-10-CM

## 2020-11-12 DIAGNOSIS — E782 Mixed hyperlipidemia: Secondary | ICD-10-CM

## 2020-11-12 DIAGNOSIS — E559 Vitamin D deficiency, unspecified: Secondary | ICD-10-CM

## 2020-11-13 ENCOUNTER — Other Ambulatory Visit: Payer: Self-pay

## 2020-11-13 ENCOUNTER — Telehealth (INDEPENDENT_AMBULATORY_CARE_PROVIDER_SITE_OTHER): Payer: Self-pay | Admitting: Family Medicine

## 2020-11-13 ENCOUNTER — Encounter: Payer: Self-pay | Admitting: Family Medicine

## 2020-11-13 VITALS — Ht 64.0 in | Wt 198.0 lb

## 2020-11-13 DIAGNOSIS — I10 Essential (primary) hypertension: Secondary | ICD-10-CM

## 2020-11-13 DIAGNOSIS — E782 Mixed hyperlipidemia: Secondary | ICD-10-CM

## 2020-11-13 DIAGNOSIS — E1159 Type 2 diabetes mellitus with other circulatory complications: Secondary | ICD-10-CM

## 2020-11-13 DIAGNOSIS — F900 Attention-deficit hyperactivity disorder, predominantly inattentive type: Secondary | ICD-10-CM

## 2020-11-13 DIAGNOSIS — E669 Obesity, unspecified: Secondary | ICD-10-CM

## 2020-11-13 LAB — CMP14+EGFR
ALT: 27 IU/L (ref 0–32)
AST: 20 IU/L (ref 0–40)
Albumin/Globulin Ratio: 2 (ref 1.2–2.2)
Albumin: 4.7 g/dL (ref 3.8–4.8)
Alkaline Phosphatase: 60 IU/L (ref 44–121)
BUN/Creatinine Ratio: 12 (ref 9–23)
BUN: 7 mg/dL (ref 6–24)
Bilirubin Total: 1.6 mg/dL — ABNORMAL HIGH (ref 0.0–1.2)
CO2: 23 mmol/L (ref 20–29)
Calcium: 9.7 mg/dL (ref 8.7–10.2)
Chloride: 100 mmol/L (ref 96–106)
Creatinine, Ser: 0.6 mg/dL (ref 0.57–1.00)
GFR calc Af Amer: 131 mL/min/{1.73_m2} (ref >59)
GFR calc non Af Amer: 114 mL/min/{1.73_m2} (ref >59)
Globulin, Total: 2.4 g/dL (ref 1.5–4.5)
Glucose: 138 mg/dL — ABNORMAL HIGH (ref 65–99)
Potassium: 3.6 mmol/L (ref 3.5–5.2)
Sodium: 140 mmol/L (ref 134–144)
Total Protein: 7.1 g/dL (ref 6.0–8.5)

## 2020-11-13 LAB — CBC
Hematocrit: 38.3 % (ref 34.0–46.6)
Hemoglobin: 12.7 g/dL (ref 11.1–15.9)
MCH: 25.2 pg — ABNORMAL LOW (ref 26.6–33.0)
MCHC: 33.2 g/dL (ref 31.5–35.7)
MCV: 76 fL — ABNORMAL LOW (ref 79–97)
Platelets: 327 10*3/uL (ref 150–450)
RBC: 5.03 x10E6/uL (ref 3.77–5.28)
RDW: 12.5 % (ref 11.7–15.4)
WBC: 6.1 10*3/uL (ref 3.4–10.8)

## 2020-11-13 LAB — LIPID PANEL
Chol/HDL Ratio: 3 ratio (ref 0.0–4.4)
Cholesterol, Total: 121 mg/dL (ref 100–199)
HDL: 40 mg/dL (ref >39)
LDL Chol Calc (NIH): 60 mg/dL (ref 0–99)
Triglycerides: 114 mg/dL (ref 0–149)
VLDL Cholesterol Cal: 21 mg/dL (ref 5–40)

## 2020-11-13 LAB — TSH: TSH: 3.26 u[IU]/mL (ref 0.450–4.500)

## 2020-11-13 LAB — VITAMIN D 25 HYDROXY (VIT D DEFICIENCY, FRACTURES): Vit D, 25-Hydroxy: 22.1 ng/mL — ABNORMAL LOW (ref 30.0–100.0)

## 2020-11-13 MED ORDER — NORLYDA 0.35 MG PO TABS
1.0000 | ORAL_TABLET | Freq: Every day | ORAL | 5 refills | Status: DC
Start: 2020-11-13 — End: 2020-11-29

## 2020-11-13 MED ORDER — AMPHETAMINE-DEXTROAMPHET ER 10 MG PO CP24: 10.0000 mg | ORAL_CAPSULE | ORAL | 0 refills | Status: DC

## 2020-11-13 NOTE — Patient Instructions (Signed)
F/u video visit with MD in 6 to 7 weeks, call if you need me sooner  Please add HBA1C to recent lab  New to help with attention is adderall 10 mg one daily  Please set aside time this evening to organize your work day in a structured way , so that you remain in your office at home and give your work 100 % attention during your work day  It is important that you exercise regularly at least 30 minutes 5 times a week. If you develop chest pain, have severe difficulty breathing, or feel very tired, stop exercising immediately and seek medical attention  Think about what you will eat, plan ahead. Choose " clean, green, fresh or frozen" over canned, processed or packaged foods which are more sugary, salty and fatty. 70 to 75% of food eaten should be vegetables and fruit. Three meals at set times with snacks allowed between meals, but they must be fruit or vegetables. Aim to eat over a 12 hour period , example 7 am to 7 pm, and STOP after  your last meal of the day. Drink water,generally about 64 ounces per day, no other drink is as healthy. Fruit juice is best enjoyed in a healthy way, by EATING the fruit. Thanks for choosing [facility], we consider it a privelige to serve you.

## 2020-11-13 NOTE — Progress Notes (Signed)
Virtual Visit via Telephone Note  I connected with Kaylee Montgomery on 11/13/20 at  1:00 PM EST by telephone and verified that I am speaking with the correct person using two identifiers.  Location: Patient: work from home Provider: work   I discussed the limitations, risks, security and privacy concerns of performing an evaluation and management service by telephone and the availability of in person appointments. I also discussed with the patient that there may be a patient responsible charge related to this service. The patient expressed understanding and agreed to proceed.   History of Present Illness: F/U chronic problems and address any new or current concerns. Review and update medications and allergies. Review recent lab and radiologic data . Update routine health maintainace. Review an encourage improved health habits to include nutrition, exercise and  sleep .  C/o inability to focus on her  Job, which is from home, work performance is negatively affected, requests help Denies polyuria, polydipsia, blurred vision , or hypoglycemic episodes. Not testing or following diet as she should, nor is she exercising regularly    Observations/Objective: Ht 5\' 4"  (1.626 m)   Wt 198 lb (89.8 kg)   BMI 33.99 kg/m  Good communication with no confusion and intact memory. Alert and oriented x 3 No signs of respiratory distress during speech   Assessment and Plan: Essential hypertension, benign DASH diet and commitment to daily physical activity for a minimum of 30 minutes discussed and encouraged, as a part of hypertension management. The importance of attaining a healthy weight is also discussed.  BP/Weight 11/13/2020 07/05/2020 04/04/2020 12/05/2019 11/01/2019 07/13/2019 04/14/9484  Systolic BP - 462 703 500 938 182 993  Diastolic BP - 78 86 94 78 78 85  Wt. (Lbs) 198 203.12 200.08 196.6 196 197 204  BMI 33.99 34.87 34.34 33.75 33.64 33.81 35.02     Controlled, no change in  medication   Type 2 diabetes mellitus with vascular disease (HCC) Improved, no med change Kaylee Montgomery is reminded of the importance of commitment to daily physical activity for 30 minutes or more, as able and the need to limit carbohydrate intake to 30 to 60 grams per meal to help with blood sugar control.   The need to take medication as prescribed, test blood sugar as directed, and to call between visits if there is a concern that blood sugar is uncontrolled is also discussed.   Kaylee Montgomery is reminded of the importance of daily foot exam, annual eye examination, and good blood sugar, blood pressure and cholesterol control.  Diabetic Labs Latest Ref Rng & Units 11/12/2020 07/06/2020 07/03/2019 06/14/2019 06/09/2019  HbA1c 4.8 - 5.6 % 7.9(H) - - 11.9(H) -  Microalbumin Not Estab. ug/mL - 19.9(H) - - -  Micro/Creat Ratio 0 - 29 mg/g creat - 38(H) - - -  Chol 100 - 199 mg/dL 121 - - 91 -  HDL >39 mg/dL 40 - - 26(L) -  Calc LDL 0 - 99 mg/dL 60 - - 39 -  Triglycerides 0 - 149 mg/dL 114 - - 180(H) -  Creatinine 0.57 - 1.00 mg/dL 0.60 - 0.49 - 0.61   BP/Weight 11/13/2020 07/05/2020 04/04/2020 12/05/2019 11/01/2019 07/13/2019 05/04/9677  Systolic BP - 938 101 751 025 852 778  Diastolic BP - 78 86 94 78 78 85  Wt. (Lbs) 198 203.12 200.08 196.6 196 197 204  BMI 33.99 34.87 34.34 33.75 33.64 33.81 35.02   Foot/eye exam completion dates 07/05/2020 05/25/2019  Foot Form Completion Done Done  Obesity (BMI 30.0-34.9)  Patient re-educated about  the importance of commitment to a  minimum of 150 minutes of exercise per week as able.  The importance of healthy food choices with portion control discussed, as well as eating regularly and within a 12 hour window most days. The need to choose "clean , green" food 50 to 75% of the time is discussed, as well as to make water the primary drink and set a goal of 64 ounces water daily.    Weight /BMI 11/13/2020 07/05/2020 04/04/2020  WEIGHT 198 lb 203 lb 1.9  oz 200 lb 1.3 oz  HEIGHT 5\' 4"  5\' 4"  5\' 4"   BMI 33.99 kg/m2 34.87 kg/m2 34.34 kg/m2      Mixed hyperlipidemia Hyperlipidemia:Low fat diet discussed and encouraged.   Lipid Panel  Lab Results  Component Value Date   CHOL 121 11/12/2020   HDL 40 11/12/2020   LDLCALC 60 11/12/2020   TRIG 114 11/12/2020   CHOLHDL 3.0 11/12/2020   Controlled, no change in medication    ADHD (attention deficit hyperactivity disorder), inattentive type Most important for pt to organize and structure her workday and rstick with work at work time Trial of low dose adderall and review in 7 weeks    Follow Up Instructions:    I discussed the assessment and treatment plan with the patient. The patient was provided an opportunity to ask questions and all were answered. The patient agreed with the plan and demonstrated an understanding of the instructions.   The patient was advised to call back or seek an in-person evaluation if the symptoms worsen or if the condition fails to improve as anticipated.  I provided 22 minutes of non-face-to-face time during this encounter.   [name], MD

## 2020-11-15 ENCOUNTER — Other Ambulatory Visit: Payer: Self-pay | Admitting: Family Medicine

## 2020-11-15 LAB — HGB A1C W/O EAG: Hgb A1c MFr Bld: 7.9 % — ABNORMAL HIGH (ref 4.8–5.6)

## 2020-11-15 LAB — SPECIMEN STATUS REPORT

## 2020-11-15 MED ORDER — GLIPIZIDE ER 2.5 MG PO TB24: 2.5000 mg | ORAL_TABLET | Freq: Every day | ORAL | 1 refills | Status: DC

## 2020-11-16 ENCOUNTER — Telehealth: Payer: Self-pay

## 2020-11-16 ENCOUNTER — Other Ambulatory Visit: Payer: Self-pay

## 2020-11-16 DIAGNOSIS — E559 Vitamin D deficiency, unspecified: Secondary | ICD-10-CM

## 2020-11-16 MED ORDER — VITAMIN D (ERGOCALCIFEROL) 1.25 MG (50000 UNIT) PO CAPS: ORAL_CAPSULE | ORAL | 1 refills | Status: DC

## 2020-11-16 NOTE — Telephone Encounter (Signed)
Pharmacy told patient they would be sending over a auth form needed for insurance purposes for the adderall xr patient just wanted [place] to be looking out for it fyi p # [phone]

## 2020-11-19 ENCOUNTER — Encounter: Payer: Self-pay | Admitting: Family Medicine

## 2020-11-19 DIAGNOSIS — F900 Attention-deficit hyperactivity disorder, predominantly inattentive type: Secondary | ICD-10-CM | POA: Insufficient documentation

## 2020-11-19 NOTE — Assessment & Plan Note (Signed)
Improved, no med change Kaylee Montgomery is reminded of the importance of commitment to daily physical activity for 30 minutes or more, as able and the need to limit carbohydrate intake to 30 to 60 grams per meal to help with blood sugar control.   The need to take medication as prescribed, test blood sugar as directed, and to call between visits if there is a concern that blood sugar is uncontrolled is also discussed.   Kaylee Montgomery is reminded of the importance of daily foot exam, annual eye examination, and good blood sugar, blood pressure and cholesterol control.  Diabetic Labs Latest Ref Rng & Units 11/12/2020 07/06/2020 07/03/2019 06/14/2019 06/09/2019  HbA1c 4.8 - 5.6 % 7.9(H) - - 11.9(H) -  Microalbumin Not Estab. ug/mL - 19.9(H) - - -  Micro/Creat Ratio 0 - 29 mg/g creat - 38(H) - - -  Chol 100 - 199 mg/dL 121 - - 91 -  HDL >39 mg/dL 40 - - 26(L) -  Calc LDL 0 - 99 mg/dL 60 - - 39 -  Triglycerides 0 - 149 mg/dL 114 - - 180(H) -  Creatinine 0.57 - 1.00 mg/dL 0.60 - 0.49 - 0.61   BP/Weight 11/13/2020 07/05/2020 04/04/2020 12/05/2019 11/01/2019 07/13/2019 7/37/1062  Systolic BP - 694 854 627 035 009 381  Diastolic BP - 78 86 94 78 78 85  Wt. (Lbs) 198 203.12 200.08 196.6 196 197 204  BMI 33.99 34.87 34.34 33.75 33.64 33.81 35.02   Foot/eye exam completion dates 07/05/2020 05/25/2019  Foot Form Completion Done Done

## 2020-11-19 NOTE — Assessment & Plan Note (Signed)
  Patient re-educated about  the importance of commitment to a  minimum of 150 minutes of exercise per week as able.  The importance of healthy food choices with portion control discussed, as well as eating regularly and within a 12 hour window most days. The need to choose "clean , green" food 50 to 75% of the time is discussed, as well as to make water the primary drink and set a goal of 64 ounces water daily.    Weight /BMI 11/13/2020 07/05/2020 04/04/2020  WEIGHT 198 lb 203 lb 1.9 oz 200 lb 1.3 oz  HEIGHT 5\' 4"  5\' 4"  5\' 4"   BMI 33.99 kg/m2 34.87 kg/m2 34.34 kg/m2

## 2020-11-19 NOTE — Telephone Encounter (Signed)
PA submitted.

## 2020-11-19 NOTE — Assessment & Plan Note (Signed)
DASH diet and commitment to daily physical activity for a minimum of 30 minutes discussed and encouraged, as a part of hypertension management. The importance of attaining a healthy weight is also discussed.  BP/Weight 11/13/2020 07/05/2020 04/04/2020 12/05/2019 11/01/2019 07/13/2019 4/49/6759  Systolic BP - 163 846 659 935 701 779  Diastolic BP - 78 86 94 78 78 85  Wt. (Lbs) 198 203.12 200.08 196.6 196 197 204  BMI 33.99 34.87 34.34 33.75 33.64 33.81 35.02     Controlled, no change in medication

## 2020-11-19 NOTE — Assessment & Plan Note (Signed)
Most important for pt to organize and structure her workday and rstick with work at work time Trial of low dose adderall and review in 7 weeks

## 2020-11-19 NOTE — Assessment & Plan Note (Signed)
Hyperlipidemia:Low fat diet discussed and encouraged.   Lipid Panel  Lab Results  Component Value Date   CHOL 121 11/12/2020   HDL 40 11/12/2020   LDLCALC 60 11/12/2020   TRIG 114 11/12/2020   CHOLHDL 3.0 11/12/2020   Controlled, no change in medication

## 2020-11-29 ENCOUNTER — Other Ambulatory Visit: Payer: Self-pay | Admitting: Endocrinology

## 2020-11-29 ENCOUNTER — Other Ambulatory Visit: Payer: Self-pay | Admitting: Family Medicine

## 2020-11-29 DIAGNOSIS — D443 Neoplasm of uncertain behavior of pituitary gland: Secondary | ICD-10-CM

## 2020-12-04 ENCOUNTER — Other Ambulatory Visit: Payer: Self-pay | Admitting: *Deleted

## 2020-12-04 DIAGNOSIS — M542 Cervicalgia: Secondary | ICD-10-CM

## 2020-12-04 MED ORDER — CYCLOBENZAPRINE HCL 10 MG PO TABS: 10.0000 mg | ORAL_TABLET | Freq: Every day | ORAL | 0 refills | Status: DC

## 2020-12-13 ENCOUNTER — Other Ambulatory Visit: Payer: Self-pay | Admitting: Family Medicine

## 2020-12-13 DIAGNOSIS — Z1231 Encounter for screening mammogram for malignant neoplasm of breast: Secondary | ICD-10-CM

## 2020-12-24 ENCOUNTER — Encounter: Payer: Self-pay | Admitting: Family Medicine

## 2020-12-24 ENCOUNTER — Other Ambulatory Visit: Payer: Self-pay

## 2020-12-24 ENCOUNTER — Telehealth: Payer: BC Managed Care – PPO | Admitting: Family Medicine

## 2020-12-24 VITALS — Ht 64.0 in | Wt 199.0 lb

## 2020-12-24 DIAGNOSIS — I10 Essential (primary) hypertension: Secondary | ICD-10-CM | POA: Diagnosis not present

## 2020-12-24 DIAGNOSIS — E782 Mixed hyperlipidemia: Secondary | ICD-10-CM

## 2020-12-24 DIAGNOSIS — R21 Rash and other nonspecific skin eruption: Secondary | ICD-10-CM

## 2020-12-24 DIAGNOSIS — E669 Obesity, unspecified: Secondary | ICD-10-CM | POA: Diagnosis not present

## 2020-12-24 DIAGNOSIS — E1159 Type 2 diabetes mellitus with other circulatory complications: Secondary | ICD-10-CM

## 2020-12-24 NOTE — Patient Instructions (Signed)
F/U early May, call if you need me sooner  I will prescribe a different cream after I see the rash, if no success, call for referral to Dermatology  Please get non fasting hBa1C, chem 7 and EGFr and May  Stop aderall and continue to work on being more organized and more disciplined, you do NOT have ADHD  It is important that you exercise regularly at least 30 minutes 5 times a week. If you develop chest pain, have severe difficulty breathing, or feel very tired, stop exercising immediately and seek medical attention  Think about what you will eat, plan ahead. Choose " clean, green, fresh or frozen" over canned, processed or packaged foods which are more sugary, salty and fatty. 70 to 75% of food eaten should be vegetables and fruit. Three meals at set times with snacks allowed between meals, but they must be fruit or vegetables. Aim to eat over a 12 hour period , example 7 am to 7 pm, and STOP after  your last meal of the day. Drink water,generally about 64 ounces per day, no other drink is as healthy. Fruit juice is best enjoyed in a healthy way, by EATING the fruit. Thanks for choosing [facility], we consider it a privelige to serve you.

## 2020-12-24 NOTE — Progress Notes (Signed)
Virtual Visit via Telephone Note  I connected with Kaylee Montgomery on 12/24/20 at  2:20 PM EST by telephone and verified that I am speaking with the correct person using two identifiers.  Location: Patient: home Provider: work   I discussed the limitations, risks, security and privacy concerns of performing an evaluation and management service by telephone and the availability of in person appointments. I also discussed with the patient that there may be a patient responsible charge related to this service. The patient expressed understanding and agreed to proceed.   History of Present Illness: Rash on posterior arm x 2 months, no response to antifungal not rasied and no drainage, both arms involved, no fever, picture is down loaded and sent Reports no change in ability to function with aderrall. I advise her to discontinue as she does not have a dx of ADHD, rather she needs to focus on and decide to become more organized Plans to start regular exercise this week, and is working on food chice to help with improved blood sugar and weight loss Denies recent fever or chills. Denies sinus pressure, nasal congestion, ear pain or sore throat. Denies chest congestion, productive cough or wheezing. Denies chest pains, palpitations and leg swelling Denies abdominal pain, nausea, vomiting,diarrhea or constipation.   Denies dysuria, frequency, hesitancy or incontinence. Denies joint pain, swelling and limitation in mobility. Denies headaches, seizures, numbness, or tingling. Denies depression, anxiety or insomnia.      Observations/Objective: Ht 5\' 4"  (1.626 m)   Wt 199 lb (90.3 kg)   BMI 34.16 kg/m  Good communication with no confusion and intact memory. Alert and oriented x 3 No signs of respiratory distress during speech    Assessment and Plan:  Essential hypertension, benign DASH diet and commitment to daily physical activity for a minimum of 30 minutes discussed and encouraged,  as a part of hypertension management. The importance of attaining a healthy weight is also discussed.  BP/Weight 12/24/2020 11/13/2020 07/05/2020 04/04/2020 12/05/2019 11/01/2019 01/02/4007  Systolic BP - - 676 195 093 267 124  Diastolic BP - - 78 86 94 78 78  Wt. (Lbs) 199 198 203.12 200.08 196.6 196 197  BMI 34.16 33.99 34.87 34.34 33.75 33.64 33.81       Type 2 diabetes mellitus with vascular disease ([place]) Ms. Borman is reminded of the importance of commitment to daily physical activity for 30 minutes or more, as able and the need to limit carbohydrate intake to 30 to 60 grams per meal to help with blood sugar control.   The need to take medication as prescribed, test blood sugar as directed, and to call between visits if there is a concern that blood sugar is uncontrolled is also discussed.   Ms. Branson is reminded of the importance of daily foot exam, annual eye examination, and good blood sugar, blood pressure and cholesterol control.  Diabetic Labs Latest Ref Rng & Units 11/12/2020 07/06/2020 07/03/2019 06/14/2019 06/09/2019  HbA1c 4.8 - 5.6 % 7.9(H) - - 11.9(H) -  Microalbumin Not Estab. ug/mL - 19.9(H) - - -  Micro/Creat Ratio 0 - 29 mg/g creat - 38(H) - - -  Chol 100 - 199 mg/dL 121 - - 91 -  HDL >39 mg/dL 40 - - 26(L) -  Calc LDL 0 - 99 mg/dL 60 - - 39 -  Triglycerides 0 - 149 mg/dL 114 - - 180(H) -  Creatinine 0.57 - 1.00 mg/dL 0.60 - 0.49 - 0.61   BP/Weight 12/24/2020 11/13/2020 07/05/2020 04/04/2020  12/05/2019 11/01/2019 6/80/3212  Systolic BP - - 248 250 037 048 889  Diastolic BP - - 78 86 94 78 78  Wt. (Lbs) 199 198 203.12 200.08 196.6 196 197  BMI 34.16 33.99 34.87 34.34 33.75 33.64 33.81   Foot/eye exam completion dates 07/05/2020 05/25/2019  Foot Form Completion Done Done      Updated lab needed at/ before next visit.   Obesity (BMI 30.0-34.9)  Patient re-educated about  the importance of commitment to a  minimum of 150 minutes of exercise per week as able.  The  importance of healthy food choices with portion control discussed, as well as eating regularly and within a 12 hour window most days. The need to choose "clean , green" food 50 to 75% of the time is discussed, as well as to make water the primary drink and set a goal of 64 ounces water daily.    Weight /BMI 12/24/2020 11/13/2020 07/05/2020  WEIGHT 199 lb 198 lb 203 lb 1.9 oz  HEIGHT 5\' 4"  5\' 4"  5\' 4"   BMI 34.16 kg/m2 33.99 kg/m2 34.87 kg/m2      Mixed hyperlipidemia Hyperlipidemia:Low fat diet discussed and encouraged.   Lipid Panel  Lab Results  Component Value Date   CHOL 121 11/12/2020   HDL 40 11/12/2020   LDLCALC 60 11/12/2020   TRIG 114 11/12/2020   CHOLHDL 3.0 11/12/2020     Updated lab needed at/ before next visit.   Rash and nonspecific skin eruption Over 2 month h/o dry flaky hyperpigmented rash on upper arms outer aspect. Short trial of steroid topically, if no response refer to Dermatology   Follow Up Instructions:    I discussed the assessment and treatment plan with the patient. The patient was provided an opportunity to ask questions and all were answered. The patient agreed with the plan and demonstrated an understanding of the instructions.   The patient was advised to call back or seek an in-person evaluation if the symptoms worsen or if the condition fails to improve as anticipated.  I provided 20 minutes of non-face-to-face time during this encounter.   [name], MD

## 2020-12-25 ENCOUNTER — Encounter: Payer: Self-pay | Admitting: Family Medicine

## 2020-12-25 MED ORDER — BETAMETHASONE DIPROPIONATE 0.05 % EX CREA: TOPICAL_CREAM | Freq: Two times a day (BID) | CUTANEOUS | 0 refills | Status: DC

## 2020-12-26 ENCOUNTER — Other Ambulatory Visit: Payer: Self-pay

## 2020-12-27 ENCOUNTER — Encounter: Payer: Self-pay | Admitting: Family Medicine

## 2020-12-27 DIAGNOSIS — R21 Rash and other nonspecific skin eruption: Secondary | ICD-10-CM | POA: Insufficient documentation

## 2020-12-27 NOTE — Assessment & Plan Note (Signed)
  Patient re-educated about  the importance of commitment to a  minimum of 150 minutes of exercise per week as able.  The importance of healthy food choices with portion control discussed, as well as eating regularly and within a 12 hour window most days. The need to choose "clean , green" food 50 to 75% of the time is discussed, as well as to make water the primary drink and set a goal of 64 ounces water daily.    Weight /BMI 12/24/2020 11/13/2020 07/05/2020  WEIGHT 199 lb 198 lb 203 lb 1.9 oz  HEIGHT 5\' 4"  5\' 4"  5\' 4"   BMI 34.16 kg/m2 33.99 kg/m2 34.87 kg/m2

## 2020-12-27 NOTE — Assessment & Plan Note (Signed)
DASH diet and commitment to daily physical activity for a minimum of 30 minutes discussed and encouraged, as a part of hypertension management. The importance of attaining a healthy weight is also discussed.  BP/Weight 12/24/2020 11/13/2020 07/05/2020 04/04/2020 12/05/2019 11/01/2019 06/12/1752  Systolic BP - - 010 404 591 368 599  Diastolic BP - - 78 86 94 78 78  Wt. (Lbs) 199 198 203.12 200.08 196.6 196 197  BMI 34.16 33.99 34.87 34.34 33.75 33.64 33.81

## 2020-12-27 NOTE — Assessment & Plan Note (Signed)
[name] is reminded of the importance of commitment to daily physical activity for 30 minutes or more, as able and the need to limit carbohydrate intake to 30 to 60 grams per meal to help with blood sugar control.   The need to take medication as prescribed, test blood sugar as directed, and to call between visits if there is a concern that blood sugar is uncontrolled is also discussed.   Kaylee Montgomery is reminded of the importance of daily foot exam, annual eye examination, and good blood sugar, blood pressure and cholesterol control.  Diabetic Labs Latest Ref Rng & Units 11/12/2020 07/06/2020 07/03/2019 06/14/2019 06/09/2019  HbA1c 4.8 - 5.6 % 7.9(H) - - 11.9(H) -  Microalbumin Not Estab. ug/mL - 19.9(H) - - -  Micro/Creat Ratio 0 - 29 mg/g creat - 38(H) - - -  Chol 100 - 199 mg/dL 121 - - 91 -  HDL >39 mg/dL 40 - - 26(L) -  Calc LDL 0 - 99 mg/dL 60 - - 39 -  Triglycerides 0 - 149 mg/dL 114 - - 180(H) -  Creatinine 0.57 - 1.00 mg/dL 0.60 - 0.49 - 0.61   BP/Weight 12/24/2020 11/13/2020 07/05/2020 04/04/2020 12/05/2019 11/01/2019 4/56/2563  Systolic BP - - 893 734 287 681 157  Diastolic BP - - 78 86 94 78 78  Wt. (Lbs) 199 198 203.12 200.08 196.6 196 197  BMI 34.16 33.99 34.87 34.34 33.75 33.64 33.81   Foot/eye exam completion dates 07/05/2020 05/25/2019  Foot Form Completion Done Done      Updated lab needed at/ before next visit.

## 2020-12-27 NOTE — Assessment & Plan Note (Signed)
Hyperlipidemia:Low fat diet discussed and encouraged.   Lipid Panel  Lab Results  Component Value Date   CHOL 121 11/12/2020   HDL 40 11/12/2020   LDLCALC 60 11/12/2020   TRIG 114 11/12/2020   CHOLHDL 3.0 11/12/2020     Updated lab needed at/ before next visit.

## 2020-12-27 NOTE — Assessment & Plan Note (Signed)
Over 2 month h/o dry flaky hyperpigmented rash on upper arms outer aspect. Short trial of steroid topically, if no response refer to Dermatology

## 2021-01-01 ENCOUNTER — Telehealth: Payer: Self-pay | Admitting: Family Medicine

## 2021-01-24 ENCOUNTER — Other Ambulatory Visit: Payer: Self-pay

## 2021-01-24 DIAGNOSIS — R21 Rash and other nonspecific skin eruption: Secondary | ICD-10-CM

## 2021-02-01 ENCOUNTER — Other Ambulatory Visit: Payer: Self-pay

## 2021-02-01 ENCOUNTER — Ambulatory Visit
Admission: RE | Admit: 2021-02-01 | Discharge: 2021-02-01 | Disposition: A | Discharge status: Home / Self Care | Payer: BC Managed Care – PPO | Source: Ambulatory Visit | Attending: Family Medicine | Admitting: Family Medicine

## 2021-02-01 DIAGNOSIS — Z1231 Encounter for screening mammogram for malignant neoplasm of breast: Secondary | ICD-10-CM

## 2021-02-05 ENCOUNTER — Other Ambulatory Visit: Payer: Self-pay | Admitting: Family Medicine

## 2021-02-05 DIAGNOSIS — R928 Other abnormal and inconclusive findings on diagnostic imaging of breast: Secondary | ICD-10-CM

## 2021-02-22 ENCOUNTER — Other Ambulatory Visit: Payer: Self-pay | Admitting: Family Medicine

## 2021-02-25 ENCOUNTER — Telehealth: Payer: Self-pay

## 2021-02-25 NOTE — Telephone Encounter (Signed)
Contacted pt to try to schedule appt due to not being seen in over a yr, received Rx request for Cyclobenzaprine. Pt was last seen 11/25/19, advised to FU in 6 months. She no showed 05/24/20 and cancelled 06/06/20. Note to her and pharmacy was placed during last refill on 12/04/20 that she needs a appt for further refills to call the office by [facility]. No appt was made

## 2021-02-26 ENCOUNTER — Ambulatory Visit
Admission: RE | Admit: 2021-02-26 | Discharge: 2021-02-26 | Disposition: A | Discharge status: Home / Self Care | Payer: BC Managed Care – PPO | Source: Ambulatory Visit | Attending: Family Medicine | Admitting: Family Medicine

## 2021-02-26 ENCOUNTER — Other Ambulatory Visit: Payer: Self-pay | Admitting: Family Medicine

## 2021-02-26 ENCOUNTER — Other Ambulatory Visit: Payer: Self-pay

## 2021-02-26 DIAGNOSIS — R928 Other abnormal and inconclusive findings on diagnostic imaging of breast: Secondary | ICD-10-CM

## 2021-03-04 ENCOUNTER — Other Ambulatory Visit: Payer: Self-pay

## 2021-03-04 ENCOUNTER — Ambulatory Visit
Admission: RE | Admit: 2021-03-04 | Discharge: 2021-03-04 | Disposition: A | Discharge status: Home / Self Care | Payer: BC Managed Care – PPO | Source: Ambulatory Visit | Attending: Family Medicine | Admitting: Family Medicine

## 2021-03-04 DIAGNOSIS — R928 Other abnormal and inconclusive findings on diagnostic imaging of breast: Secondary | ICD-10-CM

## 2021-03-26 ENCOUNTER — Telehealth: Payer: BC Managed Care – PPO | Admitting: Nurse Practitioner

## 2021-03-26 ENCOUNTER — Ambulatory Visit: Payer: BC Managed Care – PPO | Admitting: Family Medicine

## 2021-03-26 ENCOUNTER — Other Ambulatory Visit: Payer: Self-pay

## 2021-03-26 ENCOUNTER — Encounter: Payer: Self-pay | Admitting: Nurse Practitioner

## 2021-03-26 VITALS — BP 117/80 | HR 84 | Temp 98.7°F | Resp 20 | Ht 64.0 in | Wt 197.0 lb

## 2021-03-26 DIAGNOSIS — N898 Other specified noninflammatory disorders of vagina: Secondary | ICD-10-CM | POA: Insufficient documentation

## 2021-03-26 MED ORDER — FLUCONAZOLE 150 MG PO TABS: ORAL_TABLET | ORAL | 0 refills | Status: DC

## 2021-03-26 NOTE — Assessment & Plan Note (Signed)
-  likely candida given her hx and recent uncontrolled blood sugars -Rx. Diflucan -ordered NuSwab

## 2021-03-26 NOTE — Progress Notes (Signed)
Acute Office Visit  Subjective:    Patient ID: Kaylee Montgomery, female    DOB: [birth year], 40 y.o.   MRN: [id number]  Chief Complaint  Patient presents with  . Vaginal Itching    X 1 week    HPI Patient is in today for vaginal itching x 1 week.  She has noticed from scant white discharge and some erythema to her vagina.  She has diabetes and has had yeast infections before when her blood sugar is high, and she states her blood sugar has been high recently.  Denies new sexual partners.  Past Medical History:  Diagnosis Date  . Breast mass 02/2017  . Dental crown present   . Hypertension    states BP fluctuates; has been on med. x 1 yr.  . Migraines   . 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 Pituitary adenoma ([place])   . Seasonal allergies   . Sleep apnea    no CPAP use in > 6 mos.    Past Surgical History:  Procedure Laterality Date  . BREAST EXCISIONAL BIOPSY Left 2018   Negative  . BREAST LUMPECTOMY WITH RADIOACTIVE SEED LOCALIZATION Left 03/05/2017   Procedure: LEFT BREAST LUMPECTOMY WITH RADIOACTIVE SEED LOCALIZATION;  Surgeon: Donnie Mesa, MD;  Location: [facility];  Service: General;  Laterality: Left;  . CESAREAN SECTION    . CHOLECYSTECTOMY  08/2017   [facility]  . ESOPHAGOGASTRODUODENOSCOPY  07/21/2017   [facility]: Dr. Delrae Alfred. healthy appearing cyst cavity without necrotic material that was decreased in size. Removal of stent scheduled for 11/12.   Marland Kitchen EUS  06/19/2017   [facility]: 7 x 7.3 cm cyst with necrotic material noted adjacent to the body of the pancreas. FNA without malignancy.  Marland Kitchen LAPAROSCOPIC GASTRIC SLEEVE RESECTION N/A 02/18/2016   Procedure: LAPAROSCOPIC GASTRIC SLEEVE RESECTION, UPPER ENDO;  Surgeon: Greer Pickerel, MD;  Location: [facility];  Service: General;  Laterality: N/A;  . 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 TONSILLECTOMY  age 5    Family  History  Problem Relation Age of Onset  . Heart disease Father        stent  . Migraines Father   . Migraines Son   . Diabetes Maternal Grandfather   . Colon cancer Neg Hx     Social History   Socioeconomic History  . Marital status: Single    Spouse name: Not on file  . Number of children: 1  . Years of education: Not on file  . Highest education level: Not on file  Occupational History  . Occupation: STUDENT   . Occupation: CASEWORKER    Employer: [employer]  Tobacco Use  . Smoking status: Never Smoker  . Smokeless tobacco: Never Used  Vaping Use  . Vaping Use: Never used  Substance and Sexual Activity  . Alcohol use: No    Alcohol/week: 0.0 standard drinks    Comment: never drank regularly or heavily but NO etoh at all since 01/2017  . Drug use: No  . Sexual activity: Not on file  Other Topics Concern  . Not on file  Social History Narrative   Patient is right handed.   Patient drinks 1-2 cups of caffeine daily.   Social Determinants of Health   Financial Resource Strain: Not on file  Food Insecurity: Not on file  Transportation Needs: Not on file  Physical Activity: Not on file  Stress:  Not on file  Social Connections: Not on file  Intimate Partner Violence: Not on file    Outpatient Medications Prior to Visit  Medication Sig Dispense Refill  . acetaminophen (TYLENOL) 500 MG tablet Take 1,000 mg by mouth every 6 (six) hours as needed for mild pain or moderate pain.     Marland Kitchen amLODipine (NORVASC) 10 MG tablet TAKE 1 TABLET(10 MG) BY MOUTH DAILY 90 tablet 3  . atorvastatin (LIPITOR) 20 MG tablet Take 1 tablet (20 mg total) by mouth daily. 90 tablet 3  . betamethasone dipropionate 0.05 % cream Apply topically 2 (two) times daily. 30 g 0  . cyclobenzaprine (FLEXERIL) 10 MG tablet Take 1 tablet (10 mg total) by mouth at bedtime. 90 tablet 0  . Empagliflozin-metFORMIN HCl ER (SYNJARDY XR) 12.02-999 MG TB24 Take 1 tablet by mouth 2 (two) times daily.     Marland Kitchen gabapentin (NEURONTIN) 300 MG capsule Take 1 capsule (300 mg total) by mouth daily. 90 capsule 3  . glipiZIDE (GLUCOTROL XL) 2.5 MG 24 hr tablet Take 1 tablet (2.5 mg total) by mouth daily with breakfast. 90 tablet 1  . glucose blood (ONETOUCH VERIO) test strip Use as instructed bid. E11.65 100 each 5  . Lancets MISC 1 each by Does not apply route 2 (two) times daily. 100 each 5  . Metoprolol Tartrate 75 MG TABS Take 75 mg by mouth 2 (two) times daily. 180 tablet 3  . NORLYDA 0.35 MG tablet TAKE 1 TABLET(0.35 MG) BY MOUTH DAILY 28 tablet 5  . nystatin (MYCOSTATIN/NYSTOP) powder Apply 1 application topically 3 (three) times daily. Apply twice daily;y to rash in groin for 10 days, then as needed 45 g 0  . terbinafine (LAMISIL) 1 % cream Apply twice daily for 10 days to rash on forearms and in  groin , then as needed (Patient taking differently: Apply twice daily for 10 days to rash on forearms and in  groin , then as needed) 42 g 1  . Vitamin D, Ergocalciferol, (DRISDOL) 1.25 MG (50000 UNIT) CAPS capsule TAKE 1 CAPSULE BY MOUTH EVERY 7 DAYS 12 capsule 1   No facility-administered medications prior to visit.    Allergies  Allergen Reactions  . Bromocriptine Mesylate Nausea Only  . Depo-Medrol [Methylprednisolone Sodium Succ] Itching and Rash  . Dilaudid [Hydromorphone Hcl] Itching  . Imitrex [Sumatriptan] Rash    Review of Systems  Constitutional: Negative.   Genitourinary:       Vaginal itching x 1 week       Objective:    Physical Exam Constitutional:      Appearance: Normal appearance. She is obese.  Genitourinary:    Comments: Vaginal exam deferred Neurological:     Mental Status: She is alert.     BP 117/80   Pulse 84   Temp 98.7 F (37.1 C)   Resp 20   Ht 5\' 4"  (1.626 m)   Wt 197 lb (89.4 kg)   SpO2 98%   BMI 33.81 kg/m  Wt Readings from Last 3 Encounters:  03/26/21 197 lb (89.4 kg)  12/24/20 199 lb (90.3 kg)  11/13/20 198 lb (89.8 kg)    Health  Maintenance Due  Topic Date Due  . Pneumococcal Vaccine 25-45 Years old (1 of 2 - PPSV23) Never done  . TETANUS/TDAP  04/25/2017  . OPHTHALMOLOGY EXAM  03/10/2020  . COVID-19 Vaccine (3 - Booster for Moderna series) 06/22/2020    There are no preventive care reminders to display for this patient.  Lab Results  Component Value Date   TSH 3.260 11/12/2020   Lab Results  Component Value Date   WBC 6.1 11/12/2020   HGB 12.7 11/12/2020   HCT 38.3 11/12/2020   MCV 76 (L) 11/12/2020   PLT 327 11/12/2020   Lab Results  Component Value Date   NA 140 11/12/2020   K 3.6 11/12/2020   CO2 23 11/12/2020   GLUCOSE 138 (H) 11/12/2020   BUN 7 11/12/2020   CREATININE 0.60 11/12/2020   BILITOT 1.6 (H) 11/12/2020   ALKPHOS 60 11/12/2020   AST 20 11/12/2020   ALT 27 11/12/2020   PROT 7.1 11/12/2020   ALBUMIN 4.7 11/12/2020   CALCIUM 9.7 11/12/2020   ANIONGAP 12 07/03/2019   Lab Results  Component Value Date   CHOL 121 11/12/2020   Lab Results  Component Value Date   HDL 40 11/12/2020   Lab Results  Component Value Date   LDLCALC 60 11/12/2020   Lab Results  Component Value Date   TRIG 114 11/12/2020   Lab Results  Component Value Date   CHOLHDL 3.0 11/12/2020   Lab Results  Component Value Date   HGBA1C 7.9 (H) 11/12/2020       Assessment & Plan:   Problem List Items Addressed This Visit      Musculoskeletal and Integument   Vaginal itching - Primary    -likely candida given her hx and recent uncontrolled blood sugars -Rx. Diflucan -ordered NuSwab      Relevant Orders   NuSwab Vaginitis Plus (VG+)       Meds ordered this encounter  Medications  . fluconazole (DIFLUCAN) 150 MG tablet    Sig: Take 1 tablet PO now. Repeat dose in 72 hours.    Dispense:  2 tablet    Refill:  0     Noreene Larsson, NP

## 2021-03-28 ENCOUNTER — Other Ambulatory Visit: Payer: Self-pay | Admitting: Nurse Practitioner

## 2021-03-28 DIAGNOSIS — N760 Acute vaginitis: Secondary | ICD-10-CM

## 2021-03-28 DIAGNOSIS — B9689 Other specified bacterial agents as the cause of diseases classified elsewhere: Secondary | ICD-10-CM

## 2021-03-28 MED ORDER — METRONIDAZOLE 500 MG PO TABS: 500.0000 mg | ORAL_TABLET | Freq: Two times a day (BID) | ORAL | 0 refills | Status: AC

## 2021-03-28 NOTE — Progress Notes (Signed)
Pt informed

## 2021-03-28 NOTE — Progress Notes (Signed)
Her swab came back positive for BV, so I sent in flagyl to clear that up. It isn't an STD, just bacterial overgrowth. The rest of the swab has not resulted.

## 2021-03-29 LAB — NUSWAB VAGINITIS PLUS (VG+)
Atopobium vaginae: HIGH Score — AB
Candida albicans, NAA: POSITIVE — AB
Candida glabrata, NAA: NEGATIVE
Chlamydia trachomatis, NAA: NEGATIVE
Megasphaera 1: HIGH Score — AB
Neisseria gonorrhoeae, NAA: NEGATIVE
Trich vag by NAA: NEGATIVE

## 2021-04-08 ENCOUNTER — Ambulatory Visit: Payer: Self-pay | Admitting: General Surgery

## 2021-04-08 DIAGNOSIS — N6092 Unspecified benign mammary dysplasia of left breast: Secondary | ICD-10-CM

## 2021-04-09 ENCOUNTER — Other Ambulatory Visit: Payer: Self-pay | Admitting: General Surgery

## 2021-04-09 DIAGNOSIS — N6092 Unspecified benign mammary dysplasia of left breast: Secondary | ICD-10-CM

## 2021-04-23 ENCOUNTER — Other Ambulatory Visit: Payer: Self-pay

## 2021-04-23 ENCOUNTER — Ambulatory Visit: Payer: BC Managed Care – PPO | Admitting: Family Medicine

## 2021-04-23 ENCOUNTER — Encounter: Payer: Self-pay | Admitting: Family Medicine

## 2021-04-23 VITALS — BP 115/78 | HR 80 | Temp 98.4°F | Resp 20 | Ht 64.0 in | Wt 197.0 lb

## 2021-04-23 DIAGNOSIS — E1159 Type 2 diabetes mellitus with other circulatory complications: Secondary | ICD-10-CM

## 2021-04-23 DIAGNOSIS — E669 Obesity, unspecified: Secondary | ICD-10-CM

## 2021-04-23 DIAGNOSIS — Z23 Encounter for immunization: Secondary | ICD-10-CM

## 2021-04-23 DIAGNOSIS — G43019 Migraine without aura, intractable, without status migrainosus: Secondary | ICD-10-CM

## 2021-04-23 DIAGNOSIS — E559 Vitamin D deficiency, unspecified: Secondary | ICD-10-CM

## 2021-04-23 DIAGNOSIS — E1165 Type 2 diabetes mellitus with hyperglycemia: Secondary | ICD-10-CM

## 2021-04-23 DIAGNOSIS — I10 Essential (primary) hypertension: Secondary | ICD-10-CM | POA: Diagnosis not present

## 2021-04-23 DIAGNOSIS — N926 Irregular menstruation, unspecified: Secondary | ICD-10-CM | POA: Diagnosis not present

## 2021-04-23 DIAGNOSIS — E782 Mixed hyperlipidemia: Secondary | ICD-10-CM

## 2021-04-23 MED ORDER — CYCLOBENZAPRINE HCL 10 MG PO TABS
10.0000 mg | ORAL_TABLET | Freq: Every day | ORAL | 3 refills | Status: DC
Start: 2021-04-23 — End: 2022-04-28

## 2021-04-23 MED ORDER — GABAPENTIN 300 MG PO CAPS: 300.0000 mg | ORAL_CAPSULE | Freq: Every day | ORAL | 3 refills | Status: DC

## 2021-04-23 MED ORDER — METOPROLOL TARTRATE 75 MG PO TABS: ORAL_TABLET | ORAL | 3 refills | Status: DC

## 2021-04-23 NOTE — Assessment & Plan Note (Signed)
Vaginal bleeding approx 4times up until September after having no cycle for years, None since 2021, but a concern, needs Gyne, was referred no follow through

## 2021-04-23 NOTE — Patient Instructions (Addendum)
F/U in 4.5 months, call if you need  me sooner  You are referred for eye exam and gyne, please try to schedule your appointments, if you have problems let us know we will work on gyne appt \     Fasting lipid, cmp and EGFr, Vit D and microalb Sept 20 or after  TdAP today  It is important that you exercise regularly at least 30 minutes 5 times a week. If you develop chest pain, have severe difficulty breathing, or feel very tired, stop exercising immediately and seek medical attention    Think about what you will eat, plan ahead. Choose " clean, green, fresh or frozen" over canned, processed or packaged foods which are more sugary, salty and fatty. 70 to 75% of food eaten should be vegetables and fruit. Three meals at set times with snacks allowed between meals, but they must be fruit or vegetables. Aim to eat over a 12 hour period , example 7 am to 7 pm, and STOP after  your last meal of the day. Drink water,generally about 64 ounces per day, no other drink is as healthy. Fruit juice is best enjoyed in a healthy way, by EATING the fruit.    

## 2021-04-24 ENCOUNTER — Encounter: Payer: Self-pay | Admitting: Family Medicine

## 2021-04-24 NOTE — Assessment & Plan Note (Signed)
Controlled, no change in medication DASH diet and commitment to daily physical activity for a minimum of 30 minutes discussed and encouraged, as a part of hypertension management. The importance of attaining a healthy weight is also discussed.  BP/Weight 04/23/2021 03/26/2021 12/24/2020 11/13/2020 07/05/2020 04/04/2020 2/82/0601  Systolic BP 561 537 - - 943 276 147  Diastolic BP 78 80 - - 78 86 94  Wt. (Lbs) 197 197 199 198 203.12 200.08 196.6  BMI 33.81 33.81 34.16 33.99 34.87 34.34 33.75

## 2021-04-24 NOTE — Progress Notes (Signed)
Established Patient Office Visit  Subjective:  Patient ID: Kaylee Montgomery, female    DOB: [birth year]  Age: 40 y.o. MRN: [id number]  CC:  Chief Complaint  Patient presents with   Hypertension   Diabetes    HPI Kaylee Montgomery presents for folllow up of chronic medical conditions  Past Medical History:  Diagnosis Date   Breast mass 02/2017   Dental crown present    Hypertension    states BP fluctuates; has been on med. x 1 yr.   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    Past Surgical History:  Procedure Laterality Date   BREAST EXCISIONAL BIOPSY Left 2018   Negative   BREAST LUMPECTOMY WITH RADIOACTIVE SEED LOCALIZATION Left 03/05/2017   Procedure: LEFT BREAST LUMPECTOMY WITH RADIOACTIVE SEED LOCALIZATION;  Surgeon: Donnie Mesa, MD;  Location: [facility];  Service: General;  Laterality: Left;   CESAREAN SECTION     CHOLECYSTECTOMY  08/2017   [facility]   ESOPHAGOGASTRODUODENOSCOPY  07/21/2017   [facility]: Dr. Delrae Alfred. healthy appearing cyst cavity without necrotic material that was decreased in size. Removal of stent scheduled for 11/12.    EUS  06/19/2017   [facility]: 7 x 7.3 cm cyst with necrotic material noted adjacent to the body of the pancreas. FNA without malignancy.   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)   TONSILLECTOMY  age 34    Family History  Problem Relation Age of Onset   Heart disease Father        stent   Migraines Father    Migraines Son    Diabetes Maternal Grandfather    Colon cancer Neg Hx     Social History   Socioeconomic History    Marital status: Single    Spouse name: Not on file   Number of children: 1   Years of education: Not on file   Highest education level: Not on file  Occupational History   Occupation: STUDENT    Occupation: CASEWORKER    Employer: [employer]  Tobacco Use   Smoking status: Never   Smokeless tobacco: Never  Vaping Use   Vaping Use: Never used  Substance and Sexual Activity   Alcohol use: No    Alcohol/week: 0.0 standard drinks    Comment: never drank regularly or heavily but NO etoh at all since 01/2017   Drug use: No   Sexual activity: Not on file  Other Topics Concern   Not on file  Social History Narrative   Patient is right handed.   Patient drinks 1-2 cups of caffeine daily.   Social Determinants of Health   Financial Resource Strain: Not on file  Food Insecurity: Not on file  Transportation Needs: Not on file  Physical Activity: Not on file  Stress: Not on file  Social Connections: Not on file  Intimate Partner Violence: Not on file    Outpatient Medications Prior to Visit  Medication Sig Dispense Refill   acetaminophen (TYLENOL) 500 MG tablet Take 1,000 mg by mouth every 6 (six) hours as needed for mild pain  or moderate pain.      amLODipine (NORVASC) 10 MG tablet TAKE 1 TABLET(10 MG) BY MOUTH DAILY 90 tablet 3   atorvastatin (LIPITOR) 20 MG tablet Take 1 tablet (20 mg total) by mouth daily. 90 tablet 3   betamethasone dipropionate 0.05 % cream Apply topically 2 (two) times daily. 30 g 0   Empagliflozin-metFORMIN HCl ER (SYNJARDY XR) 12.02-999 MG TB24 Take 1 tablet by mouth 2 (two) times daily.     fluconazole (DIFLUCAN) 150 MG tablet Take 1 tablet PO now. Repeat dose in 72 hours. 2 tablet 0   glipiZIDE (GLUCOTROL XL) 2.5 MG 24 hr tablet Take 1 tablet (2.5 mg total) by mouth daily with breakfast. 90 tablet 1   glucose blood (ONETOUCH VERIO) test strip Use as instructed bid. E11.65 100 each 5   Lancets MISC 1 each by Does not apply route 2 (two) times  daily. 100 each 5   Metoprolol Tartrate 75 MG TABS Take 75 mg by mouth 2 (two) times daily. 180 tablet 3   NORLYDA 0.35 MG tablet TAKE 1 TABLET(0.35 MG) BY MOUTH DAILY 28 tablet 5   nystatin (MYCOSTATIN/NYSTOP) powder Apply 1 application topically 3 (three) times daily. Apply twice daily;y to rash in groin for 10 days, then as needed 45 g 0   terbinafine (LAMISIL) 1 % cream Apply twice daily for 10 days to rash on forearms and in  groin , then as needed (Patient taking differently: Apply twice daily for 10 days to rash on forearms and in  groin , then as needed) 42 g 1   Vitamin D, Ergocalciferol, (DRISDOL) 1.25 MG (50000 UNIT) CAPS capsule TAKE 1 CAPSULE BY MOUTH EVERY 7 DAYS 12 capsule 1   cyclobenzaprine (FLEXERIL) 10 MG tablet Take 1 tablet (10 mg total) by mouth at bedtime. 90 tablet 0   gabapentin (NEURONTIN) 300 MG capsule Take 1 capsule (300 mg total) by mouth daily. 90 capsule 3   No facility-administered medications prior to visit.    Allergies  Allergen Reactions   Bromocriptine Mesylate Nausea Only   Depo-Medrol [Methylprednisolone Sodium Succ] Itching and Rash   Dilaudid [Hydromorphone Hcl] Itching   Imitrex [Sumatriptan] Rash    ROS Review of Systems  Constitutional: Negative.   HENT: Negative.    Eyes: Negative.   Respiratory: Negative.    Cardiovascular: Negative.   Gastrointestinal: Negative.   Endocrine: Negative.   Genitourinary:  Positive for vaginal bleeding.       Irregular bleed , needs gyne eval  Musculoskeletal: Negative.   Skin:  Positive for rash.       Persistent and spreading on both arms, non pruritic, has derm eval in 2 months  Psychiatric/Behavioral: Negative.       Objective:   BP 115/78 (BP Location: Right Arm, Patient Position: Sitting, Cuff Size: Large)   Pulse 80   Temp 98.4 F (36.9 C)   Resp 20   Ht 5' 4" (1.626 m)   Wt 197 lb (89.4 kg)   SpO2 98%   BMI 33.81 kg/m   Physical Exam Vitals reviewed.  Constitutional:       Appearance: Normal appearance. She is obese.  HENT:     Head: Normocephalic.     Nose: Nose normal.  Eyes:     Extraocular Movements: Extraocular movements intact.  Cardiovascular:     Rate and Rhythm: Normal rate and regular rhythm.  Pulmonary:     Effort: Pulmonary effort is normal.     Breath sounds: Normal  breath sounds.  Abdominal:     Palpations: Abdomen is soft.  Musculoskeletal:        General: Normal range of motion.  Skin:    Findings: Rash present.     Comments: Macular rash mildly hyperpigmented on both arms  Neurological:     General: No focal deficit present.     Mental Status: She is alert.  Psychiatric:        Mood and Affect: Mood normal.    BP 115/78 (BP Location: Right Arm, Patient Position: Sitting, Cuff Size: Large)   Pulse 80   Temp 98.4 F (36.9 C)   Resp 20   Ht 5' 4" (1.626 m)   Wt 197 lb (89.4 kg)   SpO2 98%   BMI 33.81 kg/m  Wt Readings from Last 3 Encounters:  04/23/21 197 lb (89.4 kg)  03/26/21 197 lb (89.4 kg)  12/24/20 199 lb (90.3 kg)     Health Maintenance Due  Topic Date Due   OPHTHALMOLOGY EXAM  03/10/2020   COVID-19 Vaccine (3 - Booster for Moderna series) 06/22/2020    There are no preventive care reminders to display for this patient.  Lab Results  Component Value Date   TSH 3.260 11/12/2020   Lab Results  Component Value Date   WBC 6.1 11/12/2020   HGB 12.7 11/12/2020   HCT 38.3 11/12/2020   MCV 76 (L) 11/12/2020   PLT 327 11/12/2020   Lab Results  Component Value Date   NA 140 11/12/2020   K 3.6 11/12/2020   CO2 23 11/12/2020   GLUCOSE 138 (H) 11/12/2020   BUN 7 11/12/2020   CREATININE 0.60 11/12/2020   BILITOT 1.6 (H) 11/12/2020   ALKPHOS 60 11/12/2020   AST 20 11/12/2020   ALT 27 11/12/2020   PROT 7.1 11/12/2020   ALBUMIN 4.7 11/12/2020   CALCIUM 9.7 11/12/2020   ANIONGAP 12 07/03/2019   Lab Results  Component Value Date   CHOL 121 11/12/2020   Lab Results  Component Value Date   HDL 40  11/12/2020   Lab Results  Component Value Date   LDLCALC 60 11/12/2020   Lab Results  Component Value Date   TRIG 114 11/12/2020   Lab Results  Component Value Date   CHOLHDL 3.0 11/12/2020   Lab Results  Component Value Date   HGBA1C 7.9 (H) 11/12/2020      Assessment & Plan:  Irregular menses Vaginal bleeding approx 4times up until September after having no cycle for years, None since 2021, but a concern, needs Gyne, was referred no follow through  Essential hypertension, benign Controlled, no change in medication DASH diet and commitment to daily physical activity for a minimum of 30 minutes discussed and encouraged, as a part of hypertension management. The importance of attaining a healthy weight is also discussed.  BP/Weight 04/23/2021 03/26/2021 12/24/2020 11/13/2020 07/05/2020 04/04/2020 5/40/9811  Systolic BP 914 782 - - 956 213 086  Diastolic BP 78 80 - - 78 86 94  Wt. (Lbs) 197 197 199 198 203.12 200.08 196.6  BMI 33.81 33.81 34.16 33.99 34.87 34.34 33.75       Type 2 diabetes mellitus with vascular disease ([place]) Ms. Chicas is reminded of the importance of commitment to daily physical activity for 30 minutes or more, as able and the need to limit carbohydrate intake to 30 to 60 grams per meal to help with blood sugar control.   The need to take medication as prescribed, test blood sugar as  directed, and to call between visits if there is a concern that blood sugar is uncontrolled is also discussed.   Ms. Michelle is reminded of the importance of daily foot exam, annual eye examination, and good blood sugar, blood pressure and cholesterol control.  Diabetic Labs Latest Ref Rng & Units 11/12/2020 07/06/2020 07/03/2019 06/14/2019 06/09/2019  HbA1c 4.8 - 5.6 % 7.9(H) - - 11.9(H) -  Microalbumin Not Estab. ug/mL - 19.9(H) - - -  Micro/Creat Ratio 0 - 29 mg/g creat - 38(H) - - -  Chol 100 - 199 mg/dL 121 - - 91 -  HDL >39 mg/dL 40 - - 26(L) -  Calc LDL 0 - 99 mg/dL 60 - -  39 -  Triglycerides 0 - 149 mg/dL 114 - - 180(H) -  Creatinine 0.57 - 1.00 mg/dL 0.60 - 0.49 - 0.61   BP/Weight 04/23/2021 03/26/2021 12/24/2020 11/13/2020 07/05/2020 04/04/2020 0/24/0973  Systolic BP 532 992 - - 426 834 196  Diastolic BP 78 80 - - 78 86 94  Wt. (Lbs) 197 197 199 198 203.12 200.08 196.6  BMI 33.81 33.81 34.16 33.99 34.87 34.34 33.75   Foot/eye exam completion dates 07/05/2020 05/25/2019  Foot Form Completion Done Done      , managed by Endo  Mixed hyperlipidemia Hyperlipidemia:Low fat diet discussed and encouraged.   Lipid Panel  Lab Results  Component Value Date   CHOL 121 11/12/2020   HDL 40 11/12/2020   LDLCALC 60 11/12/2020   TRIG 114 11/12/2020   CHOLHDL 3.0 11/12/2020       Obesity (BMI 30.0-34.9)  Patient re-educated about  the importance of commitment to a  minimum of 150 minutes of exercise per week as able.  The importance of healthy food choices with portion control discussed, as well as eating regularly and within a 12 hour window most days. The need to choose "clean , green" food 50 to 75% of the time is discussed, as well as to make water the primary drink and set a goal of 64 ounces water daily.    Weight /BMI 04/23/2021 03/26/2021 12/24/2020  WEIGHT 197 lb 197 lb 199 lb  HEIGHT 5' 4" 5' 4" 5' 4"  BMI 33.81 kg/m2 33.81 kg/m2 34.16 kg/m2      Common migraine with intractable migraine Controlled, no change in medication Meds prescribed  Problem List Items Addressed This Visit       Cardiovascular and Mediastinum   Essential hypertension, benign   Relevant Medications   Metoprolol Tartrate 75 MG TABS   Other Relevant Orders   CMP14+EGFR   Type 2 diabetes mellitus with vascular disease ([place])   Relevant Medications   Metoprolol Tartrate 75 MG TABS   Other Relevant Orders   Microalbumin, urine     Other   Irregular menses    Vaginal bleeding approx 4times up until September after having no cycle for years, None since 2021, but a concern,  needs Gyne, was referred no follow through       Relevant Orders   Ambulatory referral to Gynecology   Mixed hyperlipidemia   Relevant Medications   Metoprolol Tartrate 75 MG TABS   Other Relevant Orders   Lipid Profile   Other Visit Diagnoses     Uncontrolled type 2 diabetes mellitus with hyperglycemia ([place])    -  Primary   Relevant Orders   Ambulatory referral to Ophthalmology   CMP14+EGFR   Microalbumin, urine   Vitamin D deficiency       Relevant  Orders   Vitamin D (25 hydroxy)   Immunization due       Relevant Orders   Tdap vaccine greater than or equal to 7yo IM (Completed)       Meds ordered this encounter  Medications   cyclobenzaprine (FLEXERIL) 10 MG tablet    Sig: Take 1 tablet (10 mg total) by mouth at bedtime.    Dispense:  90 tablet    Refill:  3   Metoprolol Tartrate 75 MG TABS    Sig: Take one tablet by mouth two times daily    Dispense:  180 tablet    Refill:  3   gabapentin (NEURONTIN) 300 MG capsule    Sig: Take 1 capsule (300 mg total) by mouth at bedtime.    Dispense:  90 capsule    Refill:  3    Follow-up: No follow-ups on file.    [name], MD

## 2021-04-24 NOTE — Assessment & Plan Note (Signed)
  Patient re-educated about  the importance of commitment to a  minimum of 150 minutes of exercise per week as able.  The importance of healthy food choices with portion control discussed, as well as eating regularly and within a 12 hour window most days. The need to choose "clean , green" food 50 to 75% of the time is discussed, as well as to make water the primary drink and set a goal of 64 ounces water daily.    Weight /BMI 04/23/2021 03/26/2021 12/24/2020  WEIGHT 197 lb 197 lb 199 lb  HEIGHT 5\' 4"  5\' 4"  5\' 4"   BMI 33.81 kg/m2 33.81 kg/m2 34.16 kg/m2

## 2021-04-24 NOTE — Assessment & Plan Note (Signed)
Hyperlipidemia:Low fat diet discussed and encouraged.   Lipid Panel  Lab Results  Component Value Date   CHOL 121 11/12/2020   HDL 40 11/12/2020   LDLCALC 60 11/12/2020   TRIG 114 11/12/2020   CHOLHDL 3.0 11/12/2020

## 2021-04-24 NOTE — Assessment & Plan Note (Signed)
Kaylee Montgomery is reminded of the importance of commitment to daily physical activity for 30 minutes or more, as able and the need to limit carbohydrate intake to 30 to 60 grams per meal to help with blood sugar control.   The need to take medication as prescribed, test blood sugar as directed, and to call between visits if there is a concern that blood sugar is uncontrolled is also discussed.   Kaylee Montgomery is reminded of the importance of daily foot exam, annual eye examination, and good blood sugar, blood pressure and cholesterol control.  Diabetic Labs Latest Ref Rng & Units 11/12/2020 07/06/2020 07/03/2019 06/14/2019 06/09/2019  HbA1c 4.8 - 5.6 % 7.9(H) - - 11.9(H) -  Microalbumin Not Estab. ug/mL - 19.9(H) - - -  Micro/Creat Ratio 0 - 29 mg/g creat - 38(H) - - -  Chol 100 - 199 mg/dL 121 - - 91 -  HDL >39 mg/dL 40 - - 26(L) -  Calc LDL 0 - 99 mg/dL 60 - - 39 -  Triglycerides 0 - 149 mg/dL 114 - - 180(H) -  Creatinine 0.57 - 1.00 mg/dL 0.60 - 0.49 - 0.61   BP/Weight 04/23/2021 03/26/2021 12/24/2020 11/13/2020 07/05/2020 04/04/2020 4/84/7207  Systolic BP 218 288 - - 337 445 146  Diastolic BP 78 80 - - 78 86 94  Wt. (Lbs) 197 197 199 198 203.12 200.08 196.6  BMI 33.81 33.81 34.16 33.99 34.87 34.34 33.75   Foot/eye exam completion dates 07/05/2020 05/25/2019  Foot Form Completion Done Done      , managed by Endo

## 2021-04-24 NOTE — Assessment & Plan Note (Signed)
Controlled, no change in medication Meds prescribed

## 2021-05-13 ENCOUNTER — Other Ambulatory Visit: Payer: Self-pay

## 2021-05-13 ENCOUNTER — Encounter (HOSPITAL_BASED_OUTPATIENT_CLINIC_OR_DEPARTMENT_OTHER): Payer: Self-pay | Admitting: General Surgery

## 2021-05-16 ENCOUNTER — Other Ambulatory Visit: Payer: Self-pay | Admitting: Family Medicine

## 2021-05-16 DIAGNOSIS — E559 Vitamin D deficiency, unspecified: Secondary | ICD-10-CM

## 2021-05-17 ENCOUNTER — Ambulatory Visit
Admission: RE | Admit: 2021-05-17 | Discharge: 2021-05-17 | Disposition: A | Discharge status: Home / Self Care | Payer: BC Managed Care – PPO | Source: Ambulatory Visit | Attending: General Surgery | Admitting: General Surgery

## 2021-05-17 ENCOUNTER — Other Ambulatory Visit: Payer: Self-pay

## 2021-05-17 DIAGNOSIS — N6092 Unspecified benign mammary dysplasia of left breast: Secondary | ICD-10-CM

## 2021-05-19 NOTE — Anesthesia Preprocedure Evaluation (Addendum)
Anesthesia Evaluation  Patient identified by MRN, date of birth, ID band Patient awake    Reviewed: Allergy & Precautions, NPO status , Patient's Chart, lab work & pertinent test results, reviewed documented beta blocker date and time   Airway Mallampati: III  TM Distance: >3 FB Neck ROM: Full    Dental no notable dental hx. (+) Teeth Intact, Dental Advisory Given   Pulmonary sleep apnea (non-compliant w/ CPAP- diagnosed 10 years ago) ,    Pulmonary exam normal breath sounds clear to auscultation       Cardiovascular hypertension (116/75 in preop), Pt. on medications and Pt. on home beta blockers Normal cardiovascular exam Rhythm:Regular Rate:Normal  Echo 2018: - Left ventricle: The cavity size was normal. Wall thickness was  increased in a pattern of mild LVH. Systolic function was  vigorous. The estimated ejection fraction was in the range of 65%  to 70%. Wall motion was normal; there were no regional wall  motion abnormalities. The study is not technically sufficient to  allow evaluation of LV diastolic function.  - Mitral valve: There was trivial regurgitation.  - Right atrium: Central venous pressure (est): 3 mm Hg.  - Atrial septum: No defect or patent foramen ovale was identified.  - Tricuspid valve: There was trivial regurgitation.  - Pulmonary arteries: Systolic pressure could not be accurately  estimated.  - Pericardium, extracardiac: There was no pericardial effusion.    Neuro/Psych  Headaches, negative psych ROS   GI/Hepatic negative GI ROS, Neg liver ROS,   Endo/Other  diabetes, Well Controlled, Type 2, Oral Hypoglycemic Agents, Insulin DependentObesity BMI 34 a1c 7.9 10units of long acting insulin last night- which is her normal dose Does no do sliding scale insulin for meals FS 134 in preop  Renal/GU negative Renal ROS  negative genitourinary   Musculoskeletal negative musculoskeletal  ROS (+)   Abdominal (+) + obese,   Peds  Hematology negative hematology ROS (+)   Anesthesia Other Findings L breast ca  Reproductive/Obstetrics negative OB ROS                            Anesthesia Physical Anesthesia Plan  ASA: 2  Anesthesia Plan: General   Post-op Pain Management:    Induction: Intravenous  PONV Risk Score and Plan: Ondansetron, Dexamethasone, Midazolam, Treatment may vary due to age or medical condition and Scopolamine patch - Pre-op  Airway Management Planned: LMA  Additional Equipment: None  Intra-op Plan:   Post-operative Plan: Extubation in OR  Informed Consent: I have reviewed the patients History and Physical, chart, labs and discussed the procedure including the risks, benefits and alternatives for the proposed anesthesia with the patient or authorized representative who has indicated his/her understanding and acceptance.     Dental advisory given  Plan Discussed with: CRNA  Anesthesia Plan Comments:        Anesthesia Quick Evaluation

## 2021-05-20 ENCOUNTER — Encounter (HOSPITAL_BASED_OUTPATIENT_CLINIC_OR_DEPARTMENT_OTHER)
Admission: RE | Disposition: A | Discharge status: Home / Self Care | Payer: Self-pay | Source: Home / Self Care | Attending: General Surgery

## 2021-05-20 ENCOUNTER — Ambulatory Visit
Admission: RE | Admit: 2021-05-20 | Discharge: 2021-05-20 | Disposition: A | Discharge status: Home / Self Care | Payer: BC Managed Care – PPO | Source: Ambulatory Visit | Attending: General Surgery | Admitting: General Surgery

## 2021-05-20 ENCOUNTER — Encounter (HOSPITAL_BASED_OUTPATIENT_CLINIC_OR_DEPARTMENT_OTHER): Payer: Self-pay | Admitting: General Surgery

## 2021-05-20 ENCOUNTER — Ambulatory Visit (HOSPITAL_BASED_OUTPATIENT_CLINIC_OR_DEPARTMENT_OTHER): Payer: BC Managed Care – PPO | Admitting: Anesthesiology

## 2021-05-20 ENCOUNTER — Ambulatory Visit (HOSPITAL_BASED_OUTPATIENT_CLINIC_OR_DEPARTMENT_OTHER)
Admission: RE | Admit: 2021-05-20 | Discharge: 2021-05-20 | Disposition: A | Discharge status: Home / Self Care | Payer: BC Managed Care – PPO | Attending: General Surgery | Admitting: General Surgery

## 2021-05-20 ENCOUNTER — Other Ambulatory Visit: Payer: Self-pay

## 2021-05-20 DIAGNOSIS — E119 Type 2 diabetes mellitus without complications: Secondary | ICD-10-CM | POA: Diagnosis not present

## 2021-05-20 DIAGNOSIS — N62 Hypertrophy of breast: Secondary | ICD-10-CM | POA: Diagnosis not present

## 2021-05-20 DIAGNOSIS — Z820 Family history of epilepsy and other diseases of the nervous system: Secondary | ICD-10-CM | POA: Diagnosis not present

## 2021-05-20 DIAGNOSIS — N6092 Unspecified benign mammary dysplasia of left breast: Secondary | ICD-10-CM

## 2021-05-20 DIAGNOSIS — D242 Benign neoplasm of left breast: Secondary | ICD-10-CM | POA: Diagnosis present

## 2021-05-20 DIAGNOSIS — Z888 Allergy status to other drugs, medicaments and biological substances status: Secondary | ICD-10-CM | POA: Insufficient documentation

## 2021-05-20 DIAGNOSIS — I10 Essential (primary) hypertension: Secondary | ICD-10-CM | POA: Insufficient documentation

## 2021-05-20 DIAGNOSIS — Z8249 Family history of ischemic heart disease and other diseases of the circulatory system: Secondary | ICD-10-CM | POA: Diagnosis not present

## 2021-05-20 HISTORY — PX: BREAST LUMPECTOMY WITH RADIOACTIVE SEED LOCALIZATION: SHX6424

## 2021-05-20 HISTORY — DX: Type 2 diabetes mellitus without complications: E11.9

## 2021-05-20 LAB — BASIC METABOLIC PANEL
Anion gap: 8 (ref 5–15)
BUN: 5 mg/dL — ABNORMAL LOW (ref 6–20)
CO2: 26 mmol/L (ref 22–32)
Calcium: 9.7 mg/dL (ref 8.9–10.3)
Chloride: 105 mmol/L (ref 98–111)
Creatinine, Ser: 0.61 mg/dL (ref 0.44–1.00)
GFR, Estimated: >60 mL/min (ref >60)
Glucose, Bld: 124 mg/dL — ABNORMAL HIGH (ref 70–99)
Potassium: 3.2 mmol/L — ABNORMAL LOW (ref 3.5–5.1)
Sodium: 139 mmol/L (ref 135–145)

## 2021-05-20 LAB — GLUCOSE, CAPILLARY
Glucose-Capillary: 134 mg/dL — ABNORMAL HIGH (ref 70–99)
Glucose-Capillary: 139 mg/dL — ABNORMAL HIGH (ref 70–99)

## 2021-05-20 LAB — POCT PREGNANCY, URINE: Preg Test, Ur: NEGATIVE

## 2021-05-20 SURGERY — BREAST LUMPECTOMY WITH RADIOACTIVE SEED LOCALIZATION
Anesthesia: General | Site: Breast | Laterality: Left

## 2021-05-20 MED ORDER — FENTANYL CITRATE (PF) 100 MCG/2ML IJ SOLN
INTRAMUSCULAR | Status: AC
  Filled 2021-05-20: qty 2

## 2021-05-20 MED ORDER — PROPOFOL 10 MG/ML IV BOLUS
INTRAVENOUS | Status: AC
  Filled 2021-05-20: qty 20

## 2021-05-20 MED ORDER — SCOPOLAMINE 1 MG/3DAYS TD PT72
MEDICATED_PATCH | TRANSDERMAL | Status: AC
  Filled 2021-05-20: qty 1

## 2021-05-20 MED ORDER — MIDAZOLAM HCL 5 MG/5ML IJ SOLN
INTRAMUSCULAR | Status: DC | PRN
  Administered 2021-05-20: 2 mg via INTRAVENOUS

## 2021-05-20 MED ORDER — LIDOCAINE HCL (PF) 2 % IJ SOLN
INTRAMUSCULAR | Status: AC
  Filled 2021-05-20: qty 5

## 2021-05-20 MED ORDER — FENTANYL CITRATE (PF) 100 MCG/2ML IJ SOLN: INTRAMUSCULAR | Status: DC | PRN

## 2021-05-20 MED ORDER — MIDAZOLAM HCL 2 MG/2ML IJ SOLN
INTRAMUSCULAR | Status: AC
  Filled 2021-05-20: qty 2

## 2021-05-20 MED ORDER — ACETAMINOPHEN 500 MG PO TABS
ORAL_TABLET | ORAL | Status: AC
  Filled 2021-05-20: qty 2

## 2021-05-20 MED ORDER — GABAPENTIN 300 MG PO CAPS
300.0000 mg | ORAL_CAPSULE | ORAL | Status: AC
Start: 2021-05-20 — End: 2021-05-20
  Administered 2021-05-20: 300 mg via ORAL

## 2021-05-20 MED ORDER — HYDROCODONE-ACETAMINOPHEN 5-325 MG PO TABS: 1.0000 | ORAL_TABLET | Freq: Four times a day (QID) | ORAL | 0 refills | Status: DC | PRN

## 2021-05-20 MED ORDER — CEFAZOLIN SODIUM-DEXTROSE 2-4 GM/100ML-% IV SOLN
2.0000 g | INTRAVENOUS | Status: AC
  Administered 2021-05-20: 2 g via INTRAVENOUS

## 2021-05-20 MED ORDER — FENTANYL CITRATE (PF) 100 MCG/2ML IJ SOLN
25.0000 ug | INTRAMUSCULAR | Status: DC | PRN
  Administered 2021-05-20: 50 ug via INTRAVENOUS

## 2021-05-20 MED ORDER — ONDANSETRON HCL 4 MG/2ML IJ SOLN
INTRAMUSCULAR | Status: DC | PRN
  Administered 2021-05-20: 4 mg via INTRAVENOUS

## 2021-05-20 MED ORDER — SCOPOLAMINE 1 MG/3DAYS TD PT72
1.0000 | MEDICATED_PATCH | TRANSDERMAL | Status: DC
  Administered 2021-05-20: 1.5 mg via TRANSDERMAL

## 2021-05-20 MED ORDER — FENTANYL CITRATE (PF) 100 MCG/2ML IJ SOLN
INTRAMUSCULAR | Status: DC | PRN
  Administered 2021-05-20: 50 ug via INTRAVENOUS

## 2021-05-20 MED ORDER — ACETAMINOPHEN 500 MG PO TABS: 1000.0000 mg | ORAL_TABLET | Freq: Once | ORAL | Status: DC

## 2021-05-20 MED ORDER — MEPERIDINE HCL 25 MG/ML IJ SOLN: 6.2500 mg | INTRAMUSCULAR | Status: DC | PRN

## 2021-05-20 MED ORDER — BUPIVACAINE-EPINEPHRINE (PF) 0.25% -1:200000 IJ SOLN
INTRAMUSCULAR | Status: DC | PRN
  Administered 2021-05-20: 26 mL

## 2021-05-20 MED ORDER — CEFAZOLIN SODIUM-DEXTROSE 2-4 GM/100ML-% IV SOLN
INTRAVENOUS | Status: AC
  Filled 2021-05-20: qty 100

## 2021-05-20 MED ORDER — ACETAMINOPHEN 500 MG PO TABS
1000.0000 mg | ORAL_TABLET | ORAL | Status: AC
  Administered 2021-05-20: 1000 mg via ORAL

## 2021-05-20 MED ORDER — GABAPENTIN 300 MG PO CAPS
ORAL_CAPSULE | ORAL | Status: AC
  Filled 2021-05-20: qty 1

## 2021-05-20 MED ORDER — OXYCODONE HCL 5 MG PO TABS
5.0000 mg | ORAL_TABLET | Freq: Once | ORAL | Status: AC | PRN
Start: 2021-05-20 — End: 2021-05-20
  Administered 2021-05-20: 5 mg via ORAL

## 2021-05-20 MED ORDER — KETOROLAC TROMETHAMINE 30 MG/ML IJ SOLN: 30.0000 mg | Freq: Once | INTRAMUSCULAR | Status: DC | PRN

## 2021-05-20 MED ORDER — CHLORHEXIDINE GLUCONATE CLOTH 2 % EX PADS: 6.0000 | MEDICATED_PAD | Freq: Once | CUTANEOUS | Status: DC

## 2021-05-20 MED ORDER — OXYCODONE HCL 5 MG/5ML PO SOLN: 5.0000 mg | Freq: Once | ORAL | Status: AC | PRN

## 2021-05-20 MED ORDER — CELECOXIB 200 MG PO CAPS
200.0000 mg | ORAL_CAPSULE | ORAL | Status: AC
  Administered 2021-05-20: 200 mg via ORAL

## 2021-05-20 MED ORDER — PROPOFOL 10 MG/ML IV BOLUS
INTRAVENOUS | Status: DC | PRN
  Administered 2021-05-20: 200 mg via INTRAVENOUS

## 2021-05-20 MED ORDER — LIDOCAINE HCL (CARDIAC) PF 100 MG/5ML IV SOSY
PREFILLED_SYRINGE | INTRAVENOUS | Status: DC | PRN
  Administered 2021-05-20: 80 mg via INTRATRACHEAL

## 2021-05-20 MED ORDER — ONDANSETRON HCL 4 MG/2ML IJ SOLN
INTRAMUSCULAR | Status: AC
  Filled 2021-05-20: qty 2

## 2021-05-20 MED ORDER — PROMETHAZINE HCL 25 MG/ML IJ SOLN: 6.2500 mg | INTRAMUSCULAR | Status: DC | PRN

## 2021-05-20 MED ORDER — LACTATED RINGERS IV SOLN: INTRAVENOUS | Status: DC

## 2021-05-20 MED ORDER — CELECOXIB 200 MG PO CAPS
ORAL_CAPSULE | ORAL | Status: AC
  Filled 2021-05-20: qty 1

## 2021-05-20 MED ORDER — DEXAMETHASONE SODIUM PHOSPHATE 10 MG/ML IJ SOLN
INTRAMUSCULAR | Status: DC | PRN
  Administered 2021-05-20: 5 mg via INTRAVENOUS

## 2021-05-20 MED ORDER — DEXAMETHASONE SODIUM PHOSPHATE 10 MG/ML IJ SOLN
INTRAMUSCULAR | Status: AC
  Filled 2021-05-20: qty 1

## 2021-05-20 MED ORDER — PHENYLEPHRINE HCL (PRESSORS) 10 MG/ML IV SOLN
INTRAVENOUS | Status: DC | PRN
  Administered 2021-05-20 (×3): 80 ug via INTRAVENOUS

## 2021-05-20 MED ORDER — OXYCODONE HCL 5 MG PO TABS
ORAL_TABLET | ORAL | Status: AC
  Filled 2021-05-20: qty 1

## 2021-05-20 SURGICAL SUPPLY — 44 items
ADH SKN CLS APL DERMABOND .7 (GAUZE/BANDAGES/DRESSINGS) ×1
APL PRP STRL LF DISP 70% ISPRP (MISCELLANEOUS) ×1
APPLIER CLIP 9.375 MED OPEN (MISCELLANEOUS)
APR CLP MED 9.3 20 MLT OPN (MISCELLANEOUS)
BLADE SURG 15 STRL LF DISP TIS (BLADE) ×1 IMPLANT
BLADE SURG 15 STRL SS (BLADE) ×2
CANISTER SUC SOCK COL 7IN (MISCELLANEOUS) ×1 IMPLANT
CANISTER SUCT 1200ML W/VALVE (MISCELLANEOUS) ×1 IMPLANT
CHLORAPREP W/TINT 26 (MISCELLANEOUS) ×2 IMPLANT
CLIP APPLIE 9.375 MED OPEN (MISCELLANEOUS) IMPLANT
COVER BACK TABLE 60X90IN (DRAPES) ×2 IMPLANT
COVER MAYO STAND STRL (DRAPES) ×2 IMPLANT
COVER PROBE W GEL 5X96 (DRAPES) ×2 IMPLANT
DECANTER SPIKE VIAL GLASS SM (MISCELLANEOUS) IMPLANT
DERMABOND ADVANCED (GAUZE/BANDAGES/DRESSINGS) ×1
DERMABOND ADVANCED .7 DNX12 (GAUZE/BANDAGES/DRESSINGS) ×1 IMPLANT
DRAPE LAPAROSCOPIC ABDOMINAL (DRAPES) ×2 IMPLANT
DRAPE UTILITY XL STRL (DRAPES) ×2 IMPLANT
ELECT COATED BLADE 2.86 ST (ELECTRODE) ×2 IMPLANT
ELECT REM PT RETURN 9FT ADLT (ELECTROSURGICAL) ×2
ELECTRODE REM PT RTRN 9FT ADLT (ELECTROSURGICAL) ×1 IMPLANT
GLOVE SURG ENC MOIS LTX SZ7.5 (GLOVE) ×4 IMPLANT
GLOVE SURG POLYISO LF SZ7 (GLOVE) ×1 IMPLANT
GLOVE SURG UNDER POLY LF SZ7 (GLOVE) ×2 IMPLANT
GOWN STRL REUS W/ TWL LRG LVL3 (GOWN DISPOSABLE) ×2 IMPLANT
GOWN STRL REUS W/TWL LRG LVL3 (GOWN DISPOSABLE) ×6
ILLUMINATOR WAVEGUIDE N/F (MISCELLANEOUS) IMPLANT
KIT MARKER MARGIN INK (KITS) ×2 IMPLANT
LIGHT WAVEGUIDE WIDE FLAT (MISCELLANEOUS) IMPLANT
NDL HYPO 25X1 1.5 SAFETY (NEEDLE) IMPLANT
NEEDLE HYPO 25X1 1.5 SAFETY (NEEDLE) ×2 IMPLANT
NS IRRIG 1000ML POUR BTL (IV SOLUTION) ×1 IMPLANT
PACK BASIN DAY SURGERY FS (CUSTOM PROCEDURE TRAY) ×2 IMPLANT
PENCIL SMOKE EVACUATOR (MISCELLANEOUS) ×2 IMPLANT
SLEEVE SCD COMPRESS KNEE MED (STOCKING) ×2 IMPLANT
SPONGE T-LAP 18X18 ~~LOC~~+RFID (SPONGE) ×2 IMPLANT
SUT MON AB 4-0 PC3 18 (SUTURE) ×3 IMPLANT
SUT SILK 2 0 SH (SUTURE) IMPLANT
SUT VICRYL 3-0 CR8 SH (SUTURE) ×2 IMPLANT
SYR CONTROL 10ML LL (SYRINGE) ×1 IMPLANT
TOWEL GREEN STERILE FF (TOWEL DISPOSABLE) ×2 IMPLANT
TRAY FAXITRON CT DISP (TRAY / TRAY PROCEDURE) ×3 IMPLANT
TUBE CONNECTING 20X1/4 (TUBING) ×1 IMPLANT
YANKAUER SUCT BULB TIP NO VENT (SUCTIONS) IMPLANT

## 2021-05-20 NOTE — Anesthesia Postprocedure Evaluation (Signed)
Anesthesia Post Note  Patient: Kaylee Montgomery  Procedure(s) Performed: LEFT BREAST LUMPECTOMY WITH RADIOACTIVE SEED LOCALIZATION X2 (Left: Breast)     Patient location during evaluation: PACU Anesthesia Type: General Level of consciousness: awake and alert, oriented and patient cooperative Pain management: pain level controlled Vital Signs Assessment: post-procedure vital signs reviewed and stable Respiratory status: spontaneous breathing, nonlabored ventilation and respiratory function stable Cardiovascular status: blood pressure returned to baseline and stable Postop Assessment: no apparent nausea or vomiting Anesthetic complications: no   No notable events documented.  Last Vitals:  Vitals:   05/20/21 0943 05/20/21 1248  BP: 116/75 115/81  Pulse: 85 98  Resp: 18 (!) 23  Temp: 36.9 C 36.6 C  SpO2: 99% 100%    Last Pain:  Vitals:   05/20/21 1248  TempSrc:   PainSc: [place]

## 2021-05-20 NOTE — Discharge Instructions (Addendum)
No tylenol until after 4pm today. No ibuprofen/motrin until after 6pm today.  Post Anesthesia Home Care Instructions  Activity: Get plenty of rest for the remainder of the day. A responsible individual must stay with you for 24 hours following the procedure.  For the next 24 hours, DO NOT: -Drive a car -Paediatric nurse -Drink alcoholic beverages -Take any medication unless instructed by your physician -Make any legal decisions or sign important papers.  Meals: Start with liquid foods such as gelatin or soup. Progress to regular foods as tolerated. Avoid greasy, spicy, heavy foods. If nausea and/or vomiting occur, drink only clear liquids until the nausea and/or vomiting subsides. Call your physician if vomiting continues.  Special Instructions/Symptoms: Your throat may feel dry or sore from the anesthesia or the breathing tube placed in your throat during surgery. If this causes discomfort, gargle with warm salt water. The discomfort should disappear within 24 hours.  If you had a scopolamine patch placed behind your ear for the management of post- operative nausea and/or vomiting:  1. The medication in the patch is effective for 72 hours, after which it should be removed.  Wrap patch in a tissue and discard in the trash. Wash hands thoroughly with soap and water. 2. You may remove the patch earlier than 72 hours if you experience unpleasant side effects which may include dry mouth, dizziness or visual disturbances. 3. Avoid touching the patch. Wash your hands with soap and water after contact with the patch.

## 2021-05-20 NOTE — Transfer of Care (Signed)
Immediate Anesthesia Transfer of Care Note  Patient: Kaylee Montgomery  Procedure(s) Performed: LEFT BREAST LUMPECTOMY WITH RADIOACTIVE SEED LOCALIZATION X2 (Left: Breast)  Patient Location: PACU  Anesthesia Type:General  Level of Consciousness: drowsy and patient cooperative  Airway & Oxygen Therapy: Patient Spontanous Breathing and Patient connected to face mask oxygen  Post-op Assessment: Report given to RN and Post -op Vital signs reviewed and stable  Post vital signs: Reviewed and stable  Last Vitals:  Vitals Value Taken Time  BP    Temp    Pulse 105 05/20/21 1248  Resp 19 05/20/21 1248  SpO2 99 % 05/20/21 1248  Vitals shown include unvalidated device data.  Last Pain:  Vitals:   05/20/21 0943  TempSrc: Oral  PainSc: 0-No pain      Patients Stated Pain Goal: 3 ([id number])  Complications: No notable events documented.

## 2021-05-20 NOTE — H&P (Signed)
Kaylee Montgomery  Location: [facility] Patient #: [id number] DOB: September 09, 1981 Single / Language: Cleophus Molt / Race: Black or African American Female   History of Present Illness  The patient is a 40 year old female who presents with a breast mass. We are asked to see the patient in consultation by Dr. [name] to evaluate her for atypical ductal hyperplasia of the left breast.  The patient is a 40 year old black female who recently went for a routine screening mammogram.  At that time she was found to have several small areas of calcification in the lateral aspect of the left breast that were abnormal.  The 2 most worrisome areas were biopsied and came back as atypical duct hyperplasia.  She had a previous lumpectomy 3 years ago that also showed atypical duct hyperplasia.  She does have hypertension and diabetes but does not smoke.  She is apparently had quite a few surgeries related to her gallbladder and pancreatitis and is a little bit scared of having more surgery.   Past Surgical History  Cesarean Section - 1   Gallbladder Surgery - Laparoscopic   Sleeve Gastrectomy   Tonsillectomy    Diagnostic Studies History  Colonoscopy   never Mammogram   within last year never Pap Smear   1-5 years ago  Allergies  Bromocriptine Mesylate *ANTIPARKINSON AGENTS*   Depo-Medrol (PF) *CORTICOSTEROIDS*   Imitrex *MIGRAINE PRODUCTS*   Allergies Reconciled    Medication History Metoprolol Succinate ER  ('50MG'$  Tablet ER 24HR, Oral) Active. Cyclobenzaprine HCl  ('5MG'$  Tablet, Oral) Active. Gabapentin  ('300MG'$  Capsule, Oral) Active. Medications Reconciled   Social History  Alcohol use   Occasional alcohol use, Remotely quit alcohol use. Caffeine use   Carbonated beverages, Coffee. No drug use   Tobacco use   Never smoker.  Family History  Hypertension   Father. Migraine Headache   Father.  Pregnancy / Birth History  Age at menarche   70 years. Contraceptive History    Depo-provera, Oral contraceptives. Gravida   1 Irregular periods   Maternal age   46-20 Para   1  Other Problems  Anxiety Disorder   Diabetes Mellitus   High blood pressure   Migraine Headache   Pancreatitis      Review of Systems  General Not Present- Appetite Loss, Chills, Fatigue, Fever, Night Sweats, Weight Gain and Weight Loss. Skin Not Present- Change in Wart/Mole, Dryness, Hives, Jaundice, New Lesions, Non-Healing Wounds, Rash and Ulcer. HEENT Not Present- Earache, Hearing Loss, Hoarseness, Nose Bleed, Oral Ulcers, Ringing in the Ears, Seasonal Allergies, Sinus Pain, Sore Throat, Visual Disturbances, Wears glasses/contact lenses and Yellow Eyes. Respiratory Not Present- Bloody sputum, Chronic Cough, Difficulty Breathing, Snoring and Wheezing. Breast Not Present- Breast Mass, Breast Pain, Nipple Discharge and Skin Changes. Cardiovascular Not Present- Chest Pain, Difficulty Breathing Lying Down, Leg Cramps, Palpitations, Rapid Heart Rate, Shortness of Breath and Swelling of Extremities. Gastrointestinal Not Present- Abdominal Pain, Bloating, Bloody Stool, Change in Bowel Habits, Chronic diarrhea, Constipation, Difficulty Swallowing, Excessive gas, Gets full quickly at meals, Hemorrhoids, Indigestion, Nausea, Rectal Pain and Vomiting. Female Genitourinary Present- Frequency. Not Present- Nocturia, Painful Urination, Pelvic Pain and Urgency. Neurological Not Present- Decreased Memory, Fainting, Headaches, Numbness, Seizures, Tingling, Tremor, Trouble walking and Weakness. Psychiatric Not Present- Anxiety, Bipolar, Change in Sleep Pattern, Depression, Fearful and Frequent crying. Endocrine Present- New Diabetes. Not Present- Cold Intolerance, Excessive Hunger, Hair Changes, Heat Intolerance and Hot flashes. Hematology Not Present- Blood Thinners, Easy Bruising, Excessive bleeding, Gland problems, HIV and  Persistent Infections.  Vitals  Weight: 198.38 lb   Height: 64 in  Body Surface  Area: 1.95 m   Body Mass Index: 34.05 kg/m   Temp.: 98.2 F    Pulse: 97 (Regular)    P.OX: 99% (Room air) BP: 120/90(Sitting, Left Arm, Standard)       Physical Exam  General Mental Status - Alert. General Appearance - Consistent with stated age. Hydration - Well hydrated. Voice - Normal.  Head and Neck Head - normocephalic, atraumatic with no lesions or palpable masses. Trachea - midline. Thyroid Gland Characteristics - normal size and consistency.  Eye Eyeball - Bilateral - Extraocular movements intact. Sclera/Conjunctiva - Bilateral - No scleral icterus.  Chest and Lung Exam Chest and lung exam reveals  - quiet, even and easy respiratory effort with no use of accessory muscles and on auscultation, normal breath sounds, no adventitious sounds and normal vocal resonance. Inspection Chest Wall - Normal. Back - normal.  Breast Note:  There is no palpable mass in either breast. There is no palpable axillary, supraclavicular, or cervical lymphadenopathy   Cardiovascular Cardiovascular examination reveals  - normal heart sounds, regular rate and rhythm with no murmurs and normal pedal pulses bilaterally.  Abdomen Inspection Inspection of the abdomen reveals - No Hernias. Skin - Scar - no surgical scars. Palpation/Percussion Palpation and Percussion of the abdomen reveal - Soft, Non Tender, No Rebound tenderness, No Rigidity (guarding) and No hepatosplenomegaly. Auscultation Auscultation of the abdomen reveals - Bowel sounds normal.  Neurologic Neurologic evaluation reveals  - alert and oriented x 3 with no impairment of recent or remote memory. Mental Status - Normal.  Musculoskeletal Normal Exam - Left - Upper Extremity Strength Normal and Lower Extremity Strength Normal. Normal Exam - Right - Upper Extremity Strength Normal and Lower Extremity Strength Normal.  Lymphatic Head & Neck  General Head & Neck Lymphatics: Bilateral - Description -  Normal. Axillary  General Axillary Region: Bilateral - Description - Normal. Tenderness - Non Tender. Femoral & Inguinal  Generalized Femoral & Inguinal Lymphatics: Bilateral - Description - Normal. Tenderness - Non Tender.    Assessment & Plan  ATYPICAL DUCTAL HYPERPLASIA OF LEFT BREAST (N60.92) Impression: The patient appears to have at least 2 areas of abnormal calcification in the lateral aspect of the left breast that were biopsied and came back as atypical duct hyperplasia. Because of the abnormal appearance of the calcifications and the high risk nature of the atypical duct hyperplasia my recommendation would be to have the 2 biopsied areas were removed. I have discussed with her in detail the risks and benefits of the operation to do this as well as some of the technical aspects including the use of a radioactive seed for localization and she understands and wishes to proceed. I will also recommend that she meet with oncology at the high risk breast clinic to talk about risk reduction options. The presence of atypical duct hyperplasia puts her lifetime risk between 30 and 40% for breast cancer. This patient encounter took 45 minutes today to perform the following: take history, perform exam, review outside records, interpret imaging, counsel the patient on their diagnosis and document encounter, findings & plan in the EHR Current Plans Referred to Oncology, for evaluation and follow up (Oncology). Routine.

## 2021-05-20 NOTE — Op Note (Addendum)
05/20/2021  12:45 PM  PATIENT:  Kaylee Montgomery  40 y.o. female  PRE-OPERATIVE DIAGNOSIS:  LEFT BREAST ATYPICAL DUCTAL HYPERPLASIA x2  POST-OPERATIVE DIAGNOSIS:  LEFT BREAST ATYPICAL DUCTAL HYPERPLASIA x2  PROCEDURE:  Procedure(s): LEFT BREAST LUMPECTOMY WITH RADIOACTIVE SEED LOCALIZATION X2 (Left)  SURGEON:  Surgeon(s) and Role:    * Jovita Kussmaul, MD - Primary  PHYSICIAN ASSISTANT:   ASSISTANTS: Dr. Thalia Bloodgood   ANESTHESIA:   local and general  EBL:  10 mL   BLOOD ADMINISTERED:none  DRAINS: none   LOCAL MEDICATIONS USED:  MARCAINE     SPECIMEN:  Source of Specimen:  left breast tissue lateral and inferior, left breast tissue lateral and superior with additional deep margin  DISPOSITION OF SPECIMEN:  PATHOLOGY  COUNTS:  YES  TOURNIQUET:  * No tourniquets in log *  DICTATION: .Dragon Dictation  After informed consent was obtained the patient was brought to the operating room and placed in the supine position on the operating table.  After adequate induction of general anesthesia the patient's left breast was prepped with ChloraPrep, allowed to dry, and draped in usual sterile manner.  An appropriate timeout was performed.  Previously 2 I-125 seeds were placed in the lateral aspect of the left breast to mark areas of atypical ductal hyperplasia.  The neoprobe was set to I-125 and the 2 radioactive seed areas were readily identified.  Attention was first turned to the more inferior 1.  The area around this was infiltrated with quarter percent Marcaine.  A curvilinear incision was made along the outer edge of the left breast overlying the area of radioactivity with a 15 blade knife.  The incision was carried through the skin and subcutaneous tissue sharply with the electrocautery.  Dissection was then carried towards the radioactive seed under the direction of the neoprobe.  Once I more closely approached the radioactive seed I then removed a circular portion of breast tissue  sharply with the electrocautery around the radioactive seed while checking the area of radioactivity frequently.  Once the specimen was removed it was oriented with the appropriate paint colors.  A specimen radiograph was obtained that showed the clip and seed to be near the center of the specimen.  The specimen was then sent to pathology for further evaluation.  Hemostasis was achieved using the Bovie electrocautery.  The wound was then infiltrated with quarter percent Marcaine and irrigated with saline.  The deep layer of the wound was closed with layers of interrupted 3-0 Vicryl stitches.  The skin was closed with a running 4-0 Monocryl subcuticular stitch.  Attention was then turned towards the more superior lesion laterally.  Again the area around it was infiltrated with quarter percent Marcaine.  A curvilinear incision was made overlying the area of radioactivity along the outer aspect of the left breast with a 15 blade knife.  The incision was carried through the skin and subcutaneous tissue sharply with the electrocautery.  A circular portion of breast tissue was then excised sharply around the radioactive seed while checking the area of radioactivity frequently.  Once this was accomplished the specimen was removed.  The seed was found on the deep edge of the specimen and was removed and sent separately.  The specimen was marked with the appropriate paint colors.  A specimen radiograph was obtained that did not see the clip.  I then removed an additional deep edge to the cavity and marked it appropriately.  Specimen radiograph of this tissue did reveal the clip.  The tissue was then all sent to pathology for further evaluation.  Hemostasis was achieved using the Bovie electrocautery.  The wound was irrigated with saline and infiltrated with more quarter percent Marcaine.  The deep layer of the wound was then closed with layers of interrupted 3-0 Vicryl stitches.  The skin was closed with a running 4-0  Monocryl subcuticular stitch.  Dermabond dressings were applied.  The patient tolerated the procedure well.  At the end of the case all needle sponge and instrument counts were correct.  The patient was then awakened and taken to recovery in stable condition. I was personally present during the key and critical portions of this procedure and immediately available throughout the entire procedure, as documented in my operative note.   PLAN OF CARE: Discharge to home after PACU  PATIENT DISPOSITION:  PACU - hemodynamically stable.   Delay start of Pharmacological VTE agent (>24hrs) due to surgical blood loss or risk of bleeding: not applicable

## 2021-05-20 NOTE — Anesthesia Procedure Notes (Signed)
Procedure Name: LMA Insertion Date/Time: 05/20/2021 11:36 AM Performed by: Glory Buff, CRNA Pre-anesthesia Checklist: Patient identified, Emergency Drugs available, Patient being monitored and Suction available Patient Re-evaluated:Patient Re-evaluated prior to induction Oxygen Delivery Method: Circle system utilized Preoxygenation: Pre-oxygenation with 100% oxygen Induction Type: IV induction Ventilation: Mask ventilation without difficulty LMA: LMA inserted LMA Size: 4.0 Number of attempts: 1 Placement Confirmation: positive ETCO2 Tube secured with: Tape Dental Injury: Teeth and Oropharynx as per pre-operative assessment

## 2021-05-20 NOTE — H&P (Addendum)
His was an erroneous entry

## 2021-05-20 NOTE — Interval H&P Note (Signed)
History and Physical Interval Note:  05/20/2021 11:07 AM  Kaylee Montgomery  has presented today for surgery, with the diagnosis of LEFT BREAST ADH.  The various methods of treatment have been discussed with the patient and family. After consideration of risks, benefits and other options for treatment, the patient has consented to  Procedure(s): LEFT BREAST LUMPECTOMY WITH RADIOACTIVE SEED LOCALIZATION X2 (Left) as a surgical intervention.  The patient's history has been reviewed, patient examined, no change in status, stable for surgery.  I have reviewed the patient's chart and labs.  Questions were answered to the patient's satisfaction.     [name]

## 2021-05-21 ENCOUNTER — Encounter (HOSPITAL_BASED_OUTPATIENT_CLINIC_OR_DEPARTMENT_OTHER): Payer: Self-pay | Admitting: General Surgery

## 2021-05-22 LAB — SURGICAL PATHOLOGY

## 2021-06-06 ENCOUNTER — Telehealth: Payer: Self-pay | Admitting: Hematology and Oncology

## 2021-06-06 NOTE — Telephone Encounter (Signed)
Received a new high risk from Dr. Marlou Starks. Ms. Cavil has been cld and scheduled to see Dr. Chryl Heck on 8/30 at 1040am. Pt aware to arrive 20 minutes early.

## 2021-06-10 ENCOUNTER — Encounter: Payer: BC Managed Care – PPO | Admitting: Family Medicine

## 2021-06-17 NOTE — Progress Notes (Signed)
[facility] NOTE  Patient Care Team: [name], MD as PCP - [facility], Marga Melnick, MD (Inactive) as Consulting Physician (Gastroenterology)  CHIEF COMPLAINTS/PURPOSE OF CONSULTATION:  High risk for breast cancer.  ASSESSMENT & PLAN:  Atypical ductal hyperplasia of left breast Pathology review: I discussed the difference between atypical ductal hyperplasia, DCIS and invasive breast cancer. I explained to her that atypical ductal hyperplasia  is characterized by a proliferation of uniform epithelial cells filling part, but not the entirety, of the involved duct. ADH is associated with an increased risk of both ipsilateral and contralateral breast cancer and thus provides evidence of underlying breast abnormalities that predispose to breast cancer.   ADH can increase risk of BC by 3-5 times. We have discussed about chemo prevention with Tamoxifen. We have discussed about mechanism of action, adverse effects of tamoxifen including but not limited to postmenopausal symptoms including hot flashes, mood swings, weight changes, increased risk of DVT/PE, increased risk of endometrial cancer, questionable increased risk of cardiovascular events.  The benefit of tamoxifen would be improved bone density.  I discussed the risks and benefits of tamoxifen therapy. I believe that the risks far outweigh any benefits from tamoxifen.  Instead I recommended the following 1. Exercise 30 minutes daily or 150 minutes a week. 2. Weight loss 3. Increasing fruits and vegetables and less red meat 4. Avoiding HRT  I believe all of the above would extensively decrease her risk of breast cancer as well as other cancers including cancers of the colon substantially.  Surveillance plan:  Given potential increased risk of breast cancer by 3-5 times, I think she will also benefit from an annual MRI in addition to mammogram which has been ordered.  There has been a debate going on about  the use of MRIs in these patients however there has been a strong consensus to consider MRI in these patients given the lifetime risk.  We have discussed about the increased risk of biopsies with MRIs and unknown long-term risk of gadolinium.  Patient acknowledged and is agreeable to proceed with MRI.  She was also given printed information about tamoxifen.  She will follow-up with [place] in about 4 weeks to discuss more about tamoxifen.  She will otherwise return to clinic in about 6 months.  Orders Placed This Encounter  Procedures   MR BREAST BILATERAL W WO CONTRAST INC CAD    Standing Status:   Future    Standing Expiration Date:   06/18/2022    Order Specific Question:   If indicated for the ordered procedure, I authorize the administration of contrast media per Radiology protocol    Answer:   Yes    Order Specific Question:   What is the patient's sedation requirement?    Answer:   No Sedation    Order Specific Question:   Does the patient have a pacemaker or implanted devices?    Answer:   No    Order Specific Question:   Preferred imaging location?    Answer:   [facility] (table limit - 550 lbs)    HISTORY OF PRESENTING ILLNESS:  Kaylee Montgomery 40 y.o. female is here because of ADH  Chronology  Mammogram April 2022 which showed possible mass and 2 groups of calcifications in the left breast. Further diagnostic mammo showed multiple groups of suspicious calcifications in the lateral to lower left breast spanning approximately 8 cm.  1 of these groups of calcifications is associated with the mass  which does not have a sonographic correlate.  No evidence of left axillary lymphadenopathy. Biopsy done in May 2022 from the left lower outer quadrant showed atypical lobular hyperplasia second biopsy from the left lower outer mid showed atypical ductal hyperplasia. She underwent left breast lumpectomy with radioactive seed localization x2 on August 1 by Dr. Marlou Starks This showed focal  atypical ductal hyperplasia from the left inferior lateral breast.  Left superior lateral breast excision showed focal atypical lobular hyperplasia.  There is intraductal papilloma with calcifications noted in the left deep margin of the superior lateral breast.  No evidence of malignancy. She is referred to high risk breast clinic given her atypical ductal hyperplasia and for further recommendations.  Age at menarche :49 First child birth at birth:15 Use of OCP: 15 yrs No breast cancer in the family No ovarian cancer in the family.  She has DM, HTN at baseline, well controlled according to the patient. Rest of the pertinent 10 point ROS reviewed and negative.  REVIEW OF SYSTEMS:   Constitutional: Denies fevers, chills or abnormal night sweats Eyes: Denies blurriness of vision, double vision or watery eyes Ears, nose, mouth, throat, and face: Denies mucositis or sore throat Respiratory: Denies cough, dyspnea or wheezes Cardiovascular: Denies palpitation, chest discomfort or lower extremity swelling Gastrointestinal:  Denies nausea, heartburn or change in bowel habits Skin: Denies abnormal skin rashes Lymphatics: Denies new lymphadenopathy or easy bruising Neurological:Denies numbness, tingling or new weaknesses Behavioral/Psych: Mood is stable, no new changes  All other systems were reviewed with the patient and are negative.  MEDICAL HISTORY:  Past Medical History:  Diagnosis Date   Breast mass 02/2017   Dental crown present    Diabetes mellitus without complication ([place])    Hypertension    states BP fluctuates; has been on med. x 1 yr.   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    SURGICAL HISTORY: Past Surgical History:  Procedure Laterality Date   BREAST EXCISIONAL BIOPSY Left 2018   Negative   BREAST LUMPECTOMY WITH RADIOACTIVE SEED LOCALIZATION  Left 03/05/2017   Procedure: LEFT BREAST LUMPECTOMY WITH RADIOACTIVE SEED LOCALIZATION;  Surgeon: Donnie Mesa, MD;  Location: [facility];  Service: General;  Laterality: Left;   BREAST LUMPECTOMY WITH RADIOACTIVE SEED LOCALIZATION Left 05/20/2021   Procedure: LEFT BREAST LUMPECTOMY WITH RADIOACTIVE SEED LOCALIZATION X2;  Surgeon: Jovita Kussmaul, MD;  Location: [facility];  Service: General;  Laterality: Left;   CESAREAN SECTION     CHOLECYSTECTOMY  08/2017   [facility]   ESOPHAGOGASTRODUODENOSCOPY  07/21/2017   [facility]: Dr. Delrae Alfred. healthy appearing cyst cavity without necrotic material that was decreased in size. Removal of stent scheduled for 11/12.    EUS  06/19/2017   [facility]: 7 x 7.3 cm cyst with necrotic material noted adjacent to the body of the pancreas. FNA without malignancy.   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)   TONSILLECTOMY  age 50    SOCIAL HISTORY: Social History   Socioeconomic History   Marital status: Single    Spouse name: Not on  file   Number of children: 1   Years of education: Not on file   Highest education level: Not on file  Occupational History   Occupation: STUDENT    Occupation: CASEWORKER    Employer: [employer]  Tobacco Use   Smoking status: Never   Smokeless tobacco: Never  Vaping Use   Vaping Use: Never used  Substance and Sexual Activity   Alcohol use: No    Alcohol/week: 0.0 standard drinks    Comment: never drank regularly or heavily but NO etoh at all since 01/2017   Drug use: No   Sexual activity: Not on file  Other Topics Concern   Not on file  Social History Narrative   Patient is right handed.   Patient drinks 1-2 cups of  caffeine daily.   Social Determinants of Health   Financial Resource Strain: Not on file  Food Insecurity: Not on file  Transportation Needs: Not on file  Physical Activity: Not on file  Stress: Not on file  Social Connections: Not on file  Intimate Partner Violence: Not on file    FAMILY HISTORY: Family History  Problem Relation Age of Onset   Heart disease Father        stent   Migraines Father    Migraines Son    Diabetes Maternal Grandfather    Colon cancer Neg Hx     ALLERGIES:  is allergic to bromocriptine mesylate, depo-medrol [methylprednisolone sodium succ], dilaudid [hydromorphone hcl], and imitrex [sumatriptan].  MEDICATIONS:  Current Outpatient Medications  Medication Sig Dispense Refill   acetaminophen (TYLENOL) 500 MG tablet Take 1,000 mg by mouth every 6 (six) hours as needed for mild pain or moderate pain.      amLODipine (NORVASC) 10 MG tablet TAKE 1 TABLET(10 MG) BY MOUTH DAILY 90 tablet 3   atorvastatin (LIPITOR) 20 MG tablet Take 1 tablet (20 mg total) by mouth daily. 90 tablet 3   betamethasone dipropionate 0.05 % cream Apply topically 2 (two) times daily. 30 g 0   cyclobenzaprine (FLEXERIL) 10 MG tablet Take 1 tablet (10 mg total) by mouth at bedtime. 90 tablet 3   Empagliflozin-metFORMIN HCl ER (SYNJARDY XR) 12.02-999 MG TB24 Take 1 tablet by mouth 2 (two) times daily.     fluconazole (DIFLUCAN) 150 MG tablet Take 1 tablet PO now. Repeat dose in 72 hours. 2 tablet 0   gabapentin (NEURONTIN) 300 MG capsule Take 1 capsule (300 mg total) by mouth at bedtime. 90 capsule 3   glipiZIDE (GLUCOTROL XL) 2.5 MG 24 hr tablet Take 1 tablet (2.5 mg total) by mouth daily with breakfast. 90 tablet 1   glucose blood (ONETOUCH VERIO) test strip Use as instructed bid. E11.65 100 each 5   insulin degludec (TRESIBA) 100 UNIT/ML FlexTouch Pen Inject 10 Units into the skin at bedtime.     Lancets MISC 1 each by Does not apply route 2 (two) times daily. 100 each 5    Metoprolol Tartrate 75 MG TABS Take 75 mg by mouth 2 (two) times daily. 180 tablet 3   Metoprolol Tartrate 75 MG TABS Take one tablet by mouth two times daily 180 tablet 3   NORLYDA 0.35 MG tablet TAKE 1 TABLET(0.35 MG) BY MOUTH DAILY 28 tablet 5   nystatin (MYCOSTATIN/NYSTOP) powder Apply 1 application topically 3 (three) times daily. Apply twice daily;y to rash in groin for 10 days, then as needed 45 g 0   Vitamin D, Ergocalciferol, (DRISDOL) 1.25 MG (50000 UNIT) CAPS  capsule TAKE 1 CAPSULE BY MOUTH EVERY 7 DAYS 12 capsule 1   HYDROcodone-acetaminophen (NORCO/VICODIN) 5-325 MG tablet Take 1-2 tablets by mouth every 6 (six) hours as needed for moderate pain or severe pain. 15 tablet 0   No current facility-administered medications for this visit.   PHYSICAL EXAMINATION: ECOG PERFORMANCE STATUS: 0 - Asymptomatic  Vitals:   06/18/21 1104  BP: 117/74  Pulse: 95  Resp: 19  Temp: 98.9 F (37.2 C)  SpO2: 96%   Filed Weights   06/18/21 1104  Weight: 200 lb 9.6 oz (91 kg)    GENERAL:alert, no distress and comfortable SKIN: skin color, texture, turgor are normal, no rashes or significant lesions EYES: normal, conjunctiva are pink and non-injected, sclera clear OROPHARYNX:no exudate, no erythema and lips, buccal mucosa, and tongue normal  NECK: supple, thyroid normal size, non-tender, without nodularity LYMPH:  no palpable lymphadenopathy in the cervical, axillary  LUNGS: clear to auscultation and percussion with normal breathing effort HEART: regular rate & rhythm and no murmurs and no lower extremity edema ABDOMEN:abdomen soft, non-tender and normal bowel sounds Musculoskeletal:no cyanosis of digits and no clubbing  PSYCH: alert & oriented x 3 with fluent speech NEURO: no focal motor/sensory deficits  Breast exam deferred today, she recently had one.  LABORATORY DATA:  I have reviewed the data as listed Lab Results  Component Value Date   WBC 6.1 11/12/2020   HGB 12.7  11/12/2020   HCT 38.3 11/12/2020   MCV 76 (L) 11/12/2020   PLT 327 11/12/2020     Chemistry      Component Value Date/Time   NA 139 05/20/2021 0930   NA 140 11/12/2020 1138   K 3.2 (L) 05/20/2021 0930   CL 105 05/20/2021 0930   CO2 26 05/20/2021 0930   BUN 5 (L) 05/20/2021 0930   BUN 7 11/12/2020 1138   CREATININE 0.61 05/20/2021 0930   CREATININE 0.56 03/11/2019 1013      Component Value Date/Time   CALCIUM 9.7 05/20/2021 0930   ALKPHOS 60 11/12/2020 1138   AST 20 11/12/2020 1138   ALT 27 11/12/2020 1138   BILITOT 1.6 (H) 11/12/2020 1138       RADIOGRAPHIC STUDIES: I have personally reviewed the radiological images as listed and agreed with the findings in the report. MM Breast Surgical Specimen  Result Date: 05/20/2021 CLINICAL DATA:  Status post excisional biopsies of the left breast. EXAM: SPECIMEN RADIOGRAPH OF THE LEFT BREAST COMPARISON:  Previous exam(s). FINDINGS: Status post excisions of the left breast. The radioactive seeds and biopsy marker clips are present, completely intact, and were marked for pathology. The X shaped clip and radioactive seed are in a surgical specimen. The coil shaped clip is in a surgical specimen. The second radioactive seed is in a surgical cup. IMPRESSION: Specimen radiographs of the left breast. Electronically Signed   By: Lillia Mountain M.D.   On: 05/20/2021 12:35  MM Breast Surgical Specimen  Result Date: 05/20/2021 CLINICAL DATA:  Status post excisional biopsies of the left breast. EXAM: SPECIMEN RADIOGRAPH OF THE LEFT BREAST COMPARISON:  Previous exam(s). FINDINGS: Status post excisions of the left breast. The radioactive seeds and biopsy marker clips are present, completely intact, and were marked for pathology. The X shaped clip and radioactive seed are in a surgical specimen. The coil shaped clip is in a surgical specimen. The second radioactive seed is in a surgical cup. IMPRESSION: Specimen radiographs of the left breast. Electronically  Signed   By: Mordecai Rasmussen  Arceo M.D.   On: 05/20/2021 12:35    SURGICAL PATHOLOGY  CASE: [id number]  PATIENT: [facility]  Surgical Pathology Report      Clinical History: Left breast atypical ductal hyperplasia (nt)      FINAL MICROSCOPIC DIAGNOSIS:   A. BREAST, LEFT INFERIOR-LATERAL, EXCISION:  - Focal atypical ductal hyperplasia.  - Biopsy site.   B. BREAST, LEFT SUPERIOR-LATERAL, EXCISION:  - Focal atypical lobular hyperplasia.  - Biopsy site.   C. BREAST, LEFT DEEP MARGIN OF SUPERIOR-LATERAL, EXCISION:  - Intraductal papilloma with calcifications.  - Fibrocystic change and columnar cell hyperplasia.  - Biopsy site.   All questions were answered. The patient knows to call the clinic with any problems, questions or concerns. I spent 60 minutes in the care of this patient including H and P, review of records, counseling and coordination of care.     Benay Pike, MD 06/18/2021 11:43 AM

## 2021-06-18 ENCOUNTER — Encounter: Payer: Self-pay | Admitting: Hematology and Oncology

## 2021-06-18 ENCOUNTER — Inpatient Hospital Stay: Payer: BC Managed Care – PPO | Attending: Hematology and Oncology | Admitting: Hematology and Oncology

## 2021-06-18 ENCOUNTER — Other Ambulatory Visit: Payer: Self-pay

## 2021-06-18 VITALS — BP 117/74 | HR 95 | Temp 98.9°F | Resp 19 | Ht 64.0 in | Wt 200.6 lb

## 2021-06-18 DIAGNOSIS — N6092 Unspecified benign mammary dysplasia of left breast: Secondary | ICD-10-CM | POA: Diagnosis present

## 2021-06-18 NOTE — Assessment & Plan Note (Signed)
Pathology review: I discussed the difference between atypical ductal hyperplasia, DCIS and invasive breast cancer. I explained to her that atypical ductal hyperplasia  is characterized by a proliferation of uniform epithelial cells filling part, but not the entirety, of the involved duct. ADH is associated with an increased risk of both ipsilateral and contralateral breast cancer and thus provides evidence of underlying breast abnormalities that predispose to breast cancer.   ADH can increase risk of BC by 3-5 times. We have discussed about chemo prevention with Tamoxifen. We have discussed about mechanism of action, adverse effects of tamoxifen including but not limited to postmenopausal symptoms including hot flashes, mood swings, weight changes, increased risk of DVT/PE, increased risk of endometrial cancer, questionable increased risk of cardiovascular events.  The benefit of tamoxifen would be improved bone density.  I discussed the risks and benefits of tamoxifen therapy. I believe that the risks far outweigh any benefits from tamoxifen.  Instead I recommended the following 1. Exercise 30 minutes daily or 150 minutes a week. 2. Weight loss 3. Increasing fruits and vegetables and less red meat 4. Avoiding HRT  I believe all of the above would extensively decrease her risk of breast cancer as well as other cancers including cancers of the colon substantially.  Surveillance plan:  Given potential increased risk of breast cancer by 3-5 times, I think she will also benefit from an annual MRI in addition to mammogram which has been ordered.  There has been a debate going on about the use of MRIs in these patients however there has been a strong consensus to consider MRI in these patients given the lifetime risk.  We have discussed about the increased risk of biopsies with MRIs and unknown long-term risk of gadolinium.  Patient acknowledged and is agreeable to proceed with MRI.  She was also given  printed information about tamoxifen.  She will follow-up with [place] in about 4 weeks to discuss more about tamoxifen.  She will otherwise return to clinic in about 6 months.

## 2021-07-08 ENCOUNTER — Ambulatory Visit: Payer: BC Managed Care – PPO | Admitting: Dermatology

## 2021-07-08 ENCOUNTER — Other Ambulatory Visit: Payer: Self-pay

## 2021-07-08 DIAGNOSIS — L858 Other specified epidermal thickening: Secondary | ICD-10-CM | POA: Diagnosis not present

## 2021-07-08 MED ORDER — ARAZLO 0.045 % EX LOTN: 1.0000 "application " | TOPICAL_LOTION | Freq: Every day | CUTANEOUS | 2 refills | Status: DC

## 2021-07-08 NOTE — Patient Instructions (Addendum)
[phone]   Call them to get your medication. They will ship to your house. You pay co pay over the phone.  It should be 0$ or 55$

## 2021-07-15 NOTE — Progress Notes (Signed)
[name] NOTE  Patient Care Team: [name], MD as PCP - [facility], Marga Melnick, MD (Inactive) as Consulting Physician (Gastroenterology) Lavonna Monarch, MD as Consulting Physician (Dermatology)  CHIEF COMPLAINTS/PURPOSE OF CONSULTATION:  High risk for breast cancer.  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  During her initial visit we have discussed about tamoxifen, gave printed material to the patient and she wanted to think about it.  She is here for telephone visit.  She would like to proceed with tamoxifen.  She had gone through the material and did not have any questions.  We have however once again discussed about mechanism of action of tamoxifen, adverse effects of tamoxifen and symptoms to watch out for.  Prescription was dispensed to the pharmacy of her choice.  Have also given her a phone number for central scheduling to schedule her MRI in November.  She was encouraged to contact us with any new questions or concerns.  She will otherwise return to clinic in February 2023.  HISTORY OF PRESENTING ILLNESS:  Kaylee Montgomery 40 y.o. female is here because of ADH  Chronology  Mammogram April 2022 which showed possible mass and 2 groups of calcifications in the left breast. Further diagnostic mammo showed multiple groups of suspicious calcifications in the lateral to lower left breast spanning approximately 8 cm.  1 of these groups of calcifications is associated with the mass which does not have a sonographic correlate.  No evidence of left axillary lymphadenopathy. Biopsy done in May 2022 from the left lower outer quadrant showed atypical lobular hyperplasia second biopsy from the left lower outer mid showed atypical ductal hyperplasia. She underwent left breast lumpectomy with radioactive  seed localization x2 on August 1 by Dr. Marlou Starks This showed focal atypical ductal hyperplasia from the left inferior lateral breast.  Left superior lateral breast excision showed focal atypical lobular hyperplasia.  There is intraductal papilloma with calcifications noted in the left deep margin of the superior lateral breast.  No evidence of malignancy. She is referred to high risk breast clinic given her atypical ductal hyperplasia and for further recommendations.  Age at menarche :54 First child birth at birth:15 Use of OCP: 15 yrs No breast cancer in the family No ovarian cancer in the family.  She has DM, HTN at baseline, well controlled according to the patient.  Interval History  She is here for a follow up about using tamoxifen for endocrine prevention. She is doing well. No complaints. She would like to go ahead and try tamoxifen. Rest of the pertinent 10 point ROS reviewed and negative.  REVIEW OF SYSTEMS:   Constitutional: Denies fevers, chills or abnormal night sweats Eyes: Denies blurriness of vision, double vision or watery eyes Ears, nose, mouth, throat, and face: Denies mucositis or sore throat Respiratory: Denies cough, dyspnea or wheezes Cardiovascular: Denies palpitation, chest discomfort or lower extremity swelling Gastrointestinal:  Denies nausea, heartburn or change in bowel habits Skin: Denies abnormal skin rashes Lymphatics: Denies new lymphadenopathy or easy bruising Neurological:Denies numbness, tingling or new weaknesses Behavioral/Psych: Mood is stable, no new changes  All other systems were reviewed with the patient and are negative.  MEDICAL HISTORY:  Past Medical History:  Diagnosis Date   Breast mass 02/2017   Dental crown present    Diabetes mellitus without complication ([place])    Hypertension  states BP fluctuates; has been on med. x 1 yr.   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    SURGICAL HISTORY: Past Surgical History:  Procedure Laterality Date   BREAST EXCISIONAL BIOPSY Left 2018   Negative   BREAST LUMPECTOMY WITH RADIOACTIVE SEED LOCALIZATION Left 03/05/2017   Procedure: LEFT BREAST LUMPECTOMY WITH RADIOACTIVE SEED LOCALIZATION;  Surgeon: Donnie Mesa, MD;  Location: [facility];  Service: General;  Laterality: Left;   BREAST LUMPECTOMY WITH RADIOACTIVE SEED LOCALIZATION Left 05/20/2021   Procedure: LEFT BREAST LUMPECTOMY WITH RADIOACTIVE SEED LOCALIZATION X2;  Surgeon: Jovita Kussmaul, MD;  Location: [facility];  Service: General;  Laterality: Left;   CESAREAN SECTION     CHOLECYSTECTOMY  08/2017   [facility]   ESOPHAGOGASTRODUODENOSCOPY  07/21/2017   [facility]: Dr. Delrae Alfred. healthy appearing cyst cavity without necrotic material that was decreased in size. Removal of stent scheduled for 11/12.    EUS  06/19/2017   [facility]: 7 x 7.3 cm cyst with necrotic material noted adjacent to the body of the pancreas. FNA without malignancy.   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)   TONSILLECTOMY  age 23    SOCIAL HISTORY: Social History   Socioeconomic History   Marital status: Single    Spouse name: Not on file   Number of children: 1   Years of education: Not on file   Highest education level: Not on file  Occupational History   Occupation: [place]    Occupation: CASEWORKER    Employer: [employer]  Tobacco Use   Smoking status: Never   Smokeless tobacco: Never  Vaping Use   Vaping Use: Never used  Substance and Sexual Activity   Alcohol use: No    Alcohol/week: 0.0 standard drinks     Comment: never drank regularly or heavily but NO etoh at all since 01/2017   Drug use: No   Sexual activity: Not on file  Other Topics Concern   Not on file  Social History Narrative   Patient is right handed.   Patient drinks 1-2 cups of caffeine daily.   Social Determinants of Health   Financial Resource Strain: Not on file  Food Insecurity: Not on file  Transportation Needs: Not on file  Physical Activity: Not on file  Stress: Not on file  Social Connections: Not on file  Intimate Partner Violence: Not on file    FAMILY HISTORY: Family History  Problem Relation Age of Onset   Heart disease Father        stent   Migraines Father    Migraines Son    Diabetes Maternal Grandfather    Colon cancer Neg Hx     ALLERGIES:  is allergic to bromocriptine mesylate, depo-medrol [methylprednisolone sodium succ], dilaudid [hydromorphone hcl], and imitrex [sumatriptan].  MEDICATIONS:  Current Outpatient Medications  Medication Sig Dispense Refill   tamoxifen (NOLVADEX) 20 MG tablet Take 1 tablet (20 mg total) by mouth daily. 30 tablet 3   acetaminophen (TYLENOL) 500 MG tablet Take 1,000 mg by mouth every 6 (six)  hours as needed for mild pain or moderate pain.      amLODipine (NORVASC) 10 MG tablet TAKE 1 TABLET(10 MG) BY MOUTH DAILY 90 tablet 3   amLODipine (NORVASC) 10 MG tablet 5 ml     atorvastatin (LIPITOR) 20 MG tablet Take 1 tablet (20 mg total) by mouth daily. 90 tablet 3   atorvastatin (LIPITOR) 20 MG tablet 1 tablet     BD PEN NEEDLE NANO 2ND GEN 32G X 4 MM MISC Inject into the skin daily.     betamethasone dipropionate 0.05 % cream Apply topically 2 (two) times daily. 30 g 0   cyclobenzaprine (FLEXERIL) 10 MG tablet Take 1 tablet (10 mg total) by mouth at bedtime. 90 tablet 3   Empagliflozin-metFORMIN HCl ER (SYNJARDY XR) 12.02-999 MG TB24 Take 1 tablet by mouth 2 (two) times daily.     fluconazole (DIFLUCAN) 150 MG tablet Take 1 tablet PO now. Repeat dose in 72 hours. 2  tablet 0   gabapentin (NEURONTIN) 300 MG capsule Take 1 capsule (300 mg total) by mouth at bedtime. 90 capsule 3   glipiZIDE (GLUCOTROL XL) 2.5 MG 24 hr tablet Take 1 tablet (2.5 mg total) by mouth daily with breakfast. 90 tablet 1   glucose blood (ONETOUCH VERIO) test strip Use as instructed bid. E11.65 100 each 5   insulin degludec (TRESIBA) 100 UNIT/ML FlexTouch Pen Inject 10 Units into the skin at bedtime.     Insulin Pen Needle (BD PEN NEEDLE NANO 2ND GEN) 32G X 4 MM MISC See admin instructions.     Lancets MISC 1 each by Does not apply route 2 (two) times daily. 100 each 5   Metoprolol Tartrate 75 MG TABS Take 75 mg by mouth 2 (two) times daily. 180 tablet 3   Metoprolol Tartrate 75 MG TABS Take one tablet by mouth two times daily 180 tablet 3   norethindrone (MICRONOR) 0.35 MG tablet 1 tablet     NORLYDA 0.35 MG tablet TAKE 1 TABLET(0.35 MG) BY MOUTH DAILY 28 tablet 5   nystatin (MYCOSTATIN/NYSTOP) powder Apply 1 application topically 3 (three) times daily. Apply twice daily;y to rash in groin for 10 days, then as needed 45 g 0   Tazarotene (ARAZLO) 0.045 % LOTN Apply 1 application topically daily. 45 g 2   Vitamin D, Ergocalciferol, (DRISDOL) 1.25 MG (50000 UNIT) CAPS capsule TAKE 1 CAPSULE BY MOUTH EVERY 7 DAYS 12 capsule 1   No current facility-administered medications for this visit.   PHYSICAL EXAMINATION: ECOG PERFORMANCE STATUS: 0 - Asymptomatic   VS and PE not done, telephone visit.  LABORATORY DATA:  I have reviewed the data as listed Lab Results  Component Value Date   WBC 6.1 11/12/2020   HGB 12.7 11/12/2020   HCT 38.3 11/12/2020   MCV 76 (L) 11/12/2020   PLT 327 11/12/2020     Chemistry      Component Value Date/Time   NA 139 05/20/2021 0930   NA 140 11/12/2020 1138   K 3.2 (L) 05/20/2021 0930   CL 105 05/20/2021 0930   CO2 26 05/20/2021 0930   BUN 5 (L) 05/20/2021 0930   BUN 7 11/12/2020 1138   CREATININE 0.61 05/20/2021 0930   CREATININE 0.56  03/11/2019 1013      Component Value Date/Time   CALCIUM 9.7 05/20/2021 0930   ALKPHOS 60 11/12/2020 1138   AST 20 11/12/2020 1138   ALT 27 11/12/2020 1138   BILITOT 1.6 (H) 11/12/2020 1138  RADIOGRAPHIC STUDIES: I have personally reviewed the radiological images as listed and agreed with the findings in the report. No results found.   SURGICAL PATHOLOGY  CASE: [id number]  PATIENT: Kaylee Montgomery  Surgical Pathology Report    Clinical History: Left breast atypical ductal hyperplasia (nt)   FINAL MICROSCOPIC DIAGNOSIS:   A. BREAST, LEFT INFERIOR-LATERAL, EXCISION:  - Focal atypical ductal hyperplasia.  - Biopsy site.   B. BREAST, LEFT SUPERIOR-LATERAL, EXCISION:  - Focal atypical lobular hyperplasia.  - Biopsy site.   C. BREAST, LEFT DEEP MARGIN OF SUPERIOR-LATERAL, EXCISION:  - Intraductal papilloma with calcifications.  - Fibrocystic change and columnar cell hyperplasia.  - Biopsy site.   All questions were answered. The patient knows to call the clinic with any problems, questions or concerns. I spent 15 minutes in the care of this patient including H and P, review of records, counseling and coordination of care.  I connected with  Kaylee Montgomery on 07/16/21 by a telephone application and verified that I am speaking with the correct person using two identifiers.   I discussed the limitations of evaluation and management by telemedicine. The patient expressed understanding and agreed to proceed.     Benay Pike, MD 07/16/2021 8:47 AM

## 2021-07-15 NOTE — Assessment & Plan Note (Signed)
Pathology review: I discussed the difference between atypical ductal hyperplasia, DCIS and invasive breast cancer. I explained to her that atypical ductal hyperplasia  is characterized by a proliferation of uniform epithelial cells filling part, but not the entirety, of the involved duct. ADH is associated with an increased risk of both ipsilateral and contralateral breast cancer and thus provides evidence of underlying breast abnormalities that predispose to breast cancer.   ADH can increase risk of BC by 3-5 times. We have discussed about chemo prevention with Tamoxifen. We have discussed about mechanism of action, adverse effects of tamoxifen including but not limited to postmenopausal symptoms including hot flashes, mood swings, weight changes, increased risk of DVT/PE, increased risk of endometrial cancer, questionable increased risk of cardiovascular events.  The benefit of tamoxifen would be improved bone density.  I discussed the risks and benefits of tamoxifen therapy. I believe that the risks far outweigh any benefits from tamoxifen.  Instead I recommended the following 1. Exercise 30 minutes daily or 150 minutes a week. 2. Weight loss 3. Increasing fruits and vegetables and less red meat 4. Avoiding HRT  I believe all of the above would extensively decrease her risk of breast cancer as well as other cancers including cancers of the colon substantially.  Surveillance plan:  Given potential increased risk of breast cancer by 3-5 times, I think she will also benefit from an annual MRI in addition to mammogram which has been ordered.  There has been a debate going on about the use of MRIs in these patients however there has been a strong consensus to consider MRI in these patients given the lifetime risk.  We have discussed about the increased risk of biopsies with MRIs and unknown long-term risk of gadolinium.  Patient acknowledged and is agreeable to proceed with MRI.  She was also given  printed information about tamoxifen.  She will follow-up with [place] in about 4 weeks to discuss more about tamoxifen.  She will otherwise return to clinic in about 6 months.

## 2021-07-16 ENCOUNTER — Encounter: Payer: Self-pay | Admitting: Hematology and Oncology

## 2021-07-16 ENCOUNTER — Inpatient Hospital Stay: Payer: BC Managed Care – PPO | Attending: Hematology and Oncology | Admitting: Hematology and Oncology

## 2021-07-16 DIAGNOSIS — N6092 Unspecified benign mammary dysplasia of left breast: Secondary | ICD-10-CM

## 2021-07-16 MED ORDER — TAMOXIFEN CITRATE 20 MG PO TABS: 20.0000 mg | ORAL_TABLET | Freq: Every day | ORAL | 3 refills | Status: DC

## 2021-07-20 ENCOUNTER — Other Ambulatory Visit: Payer: Self-pay | Admitting: Family Medicine

## 2021-07-21 ENCOUNTER — Encounter: Payer: Self-pay | Admitting: Dermatology

## 2021-07-21 NOTE — Progress Notes (Signed)
   New Patient   Subjective  Kaylee Montgomery is a 40 y.o. female who presents for the following: Skin Problem (Patient has some bumps on both upper arms. She thinks some of it moved to her thighs and back. X since march. No itch but it does get white heads on it. Treatment was betamethasone cream that PCP gave her. No help. ).  Rash on upper arms in recent months. Location:  Duration:  Quality:  Associated Signs/Symptoms: Modifying Factors:  Severity:  Timing: Context:    The following portions of the chart were reviewed this encounter and updated as appropriate:  Tobacco  Allergies  Meds  Problems  Med Hx  Surg Hx  Fam Hx      Objective  Well appearing patient in no apparent distress; mood and affect are within normal limits. Left Upper Arm - Posterior, Right Upper Arm - Posterior Many dozen tiny follicular keratotic papules.  No changes palms or nails.    A focused examination was performed including arms, legs, face, neck and. Relevant physical exam findings are noted in the Assessment and Plan.   Assessment & Plan  Keratosis pilaris (2) Left Upper Arm - Posterior; Right Upper Arm - Posterior  I explained the commonplace nature of keratosis Polaris, more so when young folks. Kaylee Montgomery was informed that there is no true cure for this condition.  She may try a topical tazarotene (Arazlo) every day or 2 for 10 weeks; follow-up by MyChart at that time.  Tazarotene (ARAZLO) 0.045 % LOTN - Left Upper Arm - Posterior, Right Upper Arm - Posterior Apply 1 application topically daily.

## 2021-07-27 ENCOUNTER — Other Ambulatory Visit: Payer: Self-pay | Admitting: Family Medicine

## 2021-08-25 ENCOUNTER — Other Ambulatory Visit: Payer: Self-pay | Admitting: Family Medicine

## 2021-09-03 ENCOUNTER — Other Ambulatory Visit: Payer: Self-pay | Admitting: Family Medicine

## 2021-09-03 ENCOUNTER — Ambulatory Visit: Payer: BC Managed Care – PPO | Admitting: Family Medicine

## 2021-09-03 ENCOUNTER — Other Ambulatory Visit: Payer: Self-pay

## 2021-09-03 VITALS — BP 120/72 | HR 72 | Resp 12 | Ht 64.0 in | Wt 202.0 lb

## 2021-09-03 DIAGNOSIS — E782 Mixed hyperlipidemia: Secondary | ICD-10-CM

## 2021-09-03 DIAGNOSIS — I10 Essential (primary) hypertension: Secondary | ICD-10-CM | POA: Diagnosis not present

## 2021-09-03 DIAGNOSIS — E559 Vitamin D deficiency, unspecified: Secondary | ICD-10-CM

## 2021-09-03 DIAGNOSIS — E669 Obesity, unspecified: Secondary | ICD-10-CM | POA: Diagnosis not present

## 2021-09-03 DIAGNOSIS — E1159 Type 2 diabetes mellitus with other circulatory complications: Secondary | ICD-10-CM

## 2021-09-03 DIAGNOSIS — R7989 Other specified abnormal findings of blood chemistry: Secondary | ICD-10-CM

## 2021-09-03 NOTE — Patient Instructions (Signed)
Nurse visit I next 1 week  for flu and pneumonia 20 vaccines, 8:15 appt approx, as she will get fasting labs already ordered at that time\ Though fasting can drink oJ  Please be careful about footwear esp with excessively high heels  Happy B/ day!!  Good foot exam  It is important that you exercise regularly at least 30 minutes 5 times a week. If you develop chest pain, have severe difficulty breathing, or feel very tired, stop exercising immediately and seek medical attention    Think about what you will eat, plan ahead. Choose " clean, green, fresh or frozen" over canned, processed or packaged foods which are more sugary, salty and fatty. 70 to 75% of food eaten should be vegetables and fruit. Three meals at set times with snacks allowed between meals, but they must be fruit or vegetables. Aim to eat over a 12 hour period , example 7 am to 7 pm, and STOP after  your last meal of the day. Drink water,generally about 64 ounces per day, no other drink is as healthy. Fruit juice is best enjoyed in a healthy way, by EATING the fruit.

## 2021-09-03 NOTE — Progress Notes (Signed)
Kaylee Montgomery     MRN: [id number]      DOB: 1981/05/22   HPI [name] is here for follow up and re-evaluation of chronic medical conditions, medication management and review of any available recent lab and radiology data.  Preventive health is updated, specifically  Cancer screening and Immunization.  Will return for needed vaccines Questions or concerns regarding consultations or procedures which the PT has had in the interim are  addressed. The PT denies any adverse reactions to current medications since the last visit.  C/o foot pain after wearing excessively high heels on 2 occasions this past weeks  Denies polyuria, polydipsia, blurred vision , or hypoglycemic episodes.   ROS Denies recent fever or chills. Denies sinus pressure, nasal congestion, ear pain or sore throat. Denies chest congestion, productive cough or wheezing. Denies chest pains, palpitations and leg swelling Denies abdominal pain, nausea, vomiting,diarrhea or constipation.   Denies dysuria, frequency, hesitancy or incontinence. Denies headaches, seizures, numbness, or tingling. Denies depression, anxiety or insomnia. Denies skin break down or rash.   PE  BP 120/72   Pulse 72   Resp 12   Ht 5\' 4"  (1.626 m)   Wt 202 lb (91.6 kg)   SpO2 98%   BMI 34.67 kg/m    Patient alert and oriented and in no cardiopulmonary distress.  HEENT: No facial asymmetry, EOMI,     Neck supple .  Chest: Clear to auscultation bilaterally.  CVS: S1, S2 no murmurs, no S3.Regular rate.  ABD: Soft non tender.   Ext: No edema  MS: Adequate ROM spine, shoulders, hips and knees.  Skin: Intact, no ulcerations or rash noted.  Psych: Good eye contact, normal affect. Memory intact not anxious or depressed appearing.  CNS: CN 2-12 intact, power,  normal throughout.no focal deficits noted.   Assessment & Plan  Essential hypertension, benign DASH diet and commitment to daily physical activity for a minimum of 30  minutes discussed and encouraged, as a part of hypertension management. The importance of attaining a healthy weight is also discussed.  BP/Weight 09/03/2021 06/18/2021 05/20/2021 04/23/2021 03/26/2021 12/24/2020 3/76/2831  Systolic BP 517 616 073 710 626 - -  Diastolic BP 72 74 75 78 80 - -  Wt. (Lbs) 202 200.6 196.87 197 197 199 198  BMI 34.67 34.43 33.79 33.81 33.81 34.16 33.99   Controlled, no change in medication     Type 2 diabetes mellitus with vascular disease ([place]) Kaylee Montgomery is reminded of the importance of commitment to daily physical activity for 30 minutes or more, as able and the need to limit carbohydrate intake to 30 to 60 grams per meal to help with blood sugar control.   The need to take medication as prescribed, test blood sugar as directed, and to call between visits if there is a concern that blood sugar is uncontrolled is also discussed.   [name] is reminded of the importance of daily foot exam, annual eye examination, and good blood sugar, blood pressure and cholesterol control.  Managed by endo and improved, will obain updated info  Diabetic Labs Latest Ref Rng & Units 05/20/2021 11/12/2020 07/06/2020 07/03/2019 06/14/2019  HbA1c 4.8 - 5.6 % - 7.9(H) - - 11.9(H)  Microalbumin Not Estab. ug/mL - - 19.9(H) - -  Micro/Creat Ratio 0 - 29 mg/g creat - - 38(H) - -  Chol 100 - 199 mg/dL - 121 - - 91  HDL >39 mg/dL - 40 - - 26(L)  Calc LDL 0 -  99 mg/dL - 60 - - 39  Triglycerides 0 - 149 mg/dL - 114 - - 180(H)  Creatinine 0.44 - 1.00 mg/dL 0.61 0.60 - 0.49 -   BP/Weight 09/03/2021 06/18/2021 05/20/2021 04/23/2021 03/26/2021 12/24/2020 5/36/6440  Systolic BP 347 425 956 387 564 - -  Diastolic BP 72 74 75 78 80 - -  Wt. (Lbs) 202 200.6 196.87 197 197 199 198  BMI 34.67 34.43 33.79 33.81 33.81 34.16 33.99   Foot/eye exam completion dates 07/05/2020 05/25/2019  Foot Form Completion Done Done        Obesity (BMI 30.0-34.9)  Patient re-educated about  the importance of commitment  to a  minimum of 150 minutes of exercise per week as able.  The importance of healthy food choices with portion control discussed, as well as eating regularly and within a 12 hour window most days. The need to choose "clean , green" food 50 to 75% of the time is discussed, as well as to make water the primary drink and set a goal of 64 ounces water daily.    Weight /BMI 09/03/2021 06/18/2021 05/20/2021  WEIGHT 202 lb 200 lb 9.6 oz 196 lb 13.9 oz  HEIGHT 5\' 4"  5\' 4"  5\' 4"   BMI 34.67 kg/m2 34.43 kg/m2 33.79 kg/m2      Mixed hyperlipidemia Hyperlipidemia:Low fat diet discussed and encouraged.   Lipid Panel  Lab Results  Component Value Date   CHOL 121 11/12/2020   HDL 40 11/12/2020   LDLCALC 60 11/12/2020   TRIG 114 11/12/2020   CHOLHDL 3.0 11/12/2020     Updated lab needed at/ before next visit.   Low serum vitamin D Updated lab needed at/ before next visit.

## 2021-09-11 ENCOUNTER — Encounter: Payer: Self-pay | Admitting: Family Medicine

## 2021-09-11 DIAGNOSIS — R7989 Other specified abnormal findings of blood chemistry: Secondary | ICD-10-CM | POA: Insufficient documentation

## 2021-09-11 NOTE — Assessment & Plan Note (Signed)
Hyperlipidemia:Low fat diet discussed and encouraged.   Lipid Panel  Lab Results  Component Value Date   CHOL 121 11/12/2020   HDL 40 11/12/2020   LDLCALC 60 11/12/2020   TRIG 114 11/12/2020   CHOLHDL 3.0 11/12/2020     Updated lab needed at/ before next visit.

## 2021-09-11 NOTE — Assessment & Plan Note (Signed)
  Patient re-educated about  the importance of commitment to a  minimum of 150 minutes of exercise per week as able.  The importance of healthy food choices with portion control discussed, as well as eating regularly and within a 12 hour window most days. The need to choose "clean , green" food 50 to 75% of the time is discussed, as well as to make water the primary drink and set a goal of 64 ounces water daily.    Weight /BMI 09/03/2021 06/18/2021 05/20/2021  WEIGHT 202 lb 200 lb 9.6 oz 196 lb 13.9 oz  HEIGHT 5\' 4"  5\' 4"  5\' 4"   BMI 34.67 kg/m2 34.43 kg/m2 33.79 kg/m2

## 2021-09-11 NOTE — Assessment & Plan Note (Signed)
Kaylee Montgomery is reminded of the importance of commitment to daily physical activity for 30 minutes or more, as able and the need to limit carbohydrate intake to 30 to 60 grams per meal to help with blood sugar control.   The need to take medication as prescribed, test blood sugar as directed, and to call between visits if there is a concern that blood sugar is uncontrolled is also discussed.   Kaylee Montgomery is reminded of the importance of daily foot exam, annual eye examination, and good blood sugar, blood pressure and cholesterol control.  Managed by endo and improved, will obain updated info  Diabetic Labs Latest Ref Rng & Units 05/20/2021 11/12/2020 07/06/2020 07/03/2019 06/14/2019  HbA1c 4.8 - 5.6 % - 7.9(H) - - 11.9(H)  Microalbumin Not Estab. ug/mL - - 19.9(H) - -  Micro/Creat Ratio 0 - 29 mg/g creat - - 38(H) - -  Chol 100 - 199 mg/dL - 121 - - 91  HDL >39 mg/dL - 40 - - 26(L)  Calc LDL 0 - 99 mg/dL - 60 - - 39  Triglycerides 0 - 149 mg/dL - 114 - - 180(H)  Creatinine 0.44 - 1.00 mg/dL 0.61 0.60 - 0.49 -   BP/Weight 09/03/2021 06/18/2021 05/20/2021 04/23/2021 03/26/2021 12/24/2020 9/32/6712  Systolic BP 458 099 833 825 053 - -  Diastolic BP 72 74 75 78 80 - -  Wt. (Lbs) 202 200.6 196.87 197 197 199 198  BMI 34.67 34.43 33.79 33.81 33.81 34.16 33.99   Foot/eye exam completion dates 07/05/2020 05/25/2019  Foot Form Completion Done Done

## 2021-09-11 NOTE — Assessment & Plan Note (Signed)
Updated lab needed at/ before next visit.   

## 2021-09-11 NOTE — Assessment & Plan Note (Signed)
DASH diet and commitment to daily physical activity for a minimum of 30 minutes discussed and encouraged, as a part of hypertension management. The importance of attaining a healthy weight is also discussed.  BP/Weight 09/03/2021 06/18/2021 05/20/2021 04/23/2021 03/26/2021 12/24/2020 1/82/8833  Systolic BP 744 514 604 799 872 - -  Diastolic BP 72 74 75 78 80 - -  Wt. (Lbs) 202 200.6 196.87 197 197 199 198  BMI 34.67 34.43 33.79 33.81 33.81 34.16 33.99   Controlled, no change in medication

## 2021-09-12 IMAGING — MG MM DIGITAL SCREENING BILAT W/ TOMO AND CAD
6 of 10 series · 6 of 30 positions shown · non-contrast
Comparison: Previous exam(s).

CLINICAL DATA: Screening.

EXAM:
DIGITAL SCREENING BILATERAL MAMMOGRAM WITH TOMOSYNTHESIS AND CAD
TECHNIQUE: Bilateral screening digital craniocaudal and mediolateral oblique
mammograms were obtained. Bilateral screening digital breast
tomosynthesis was performed. The images were evaluated with
computer-aided detection.

[L CC synth-2D]
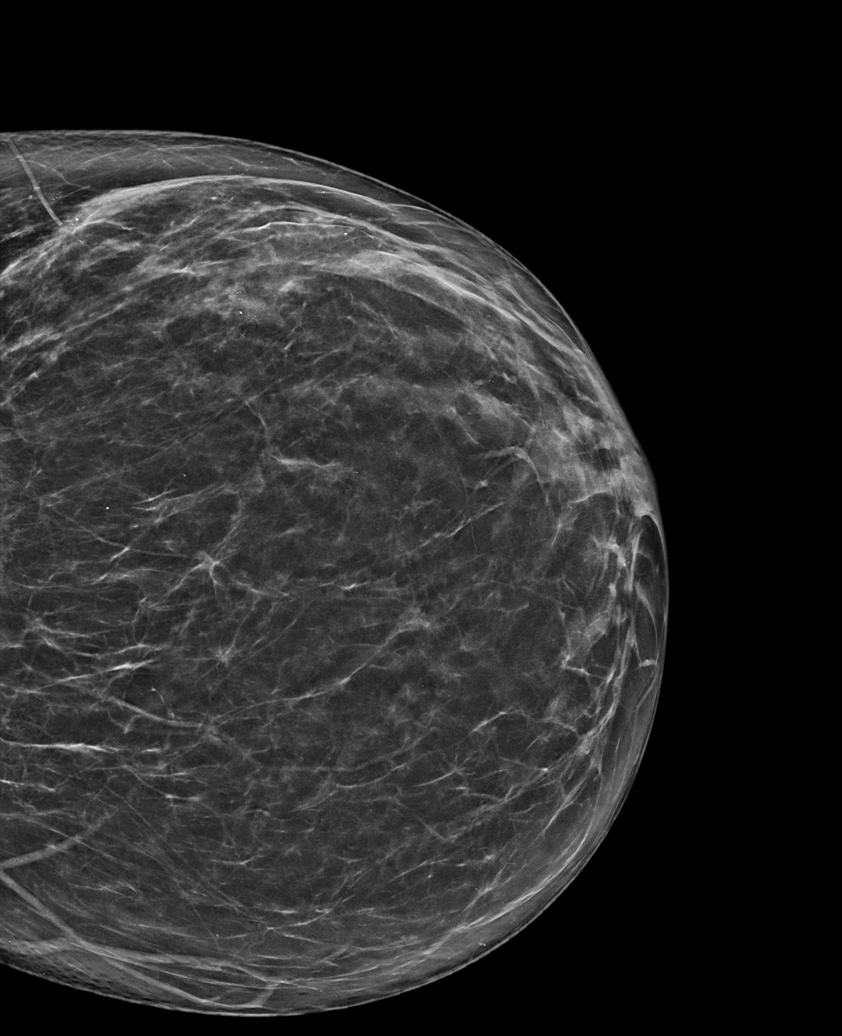

[R MLO synth-2D]
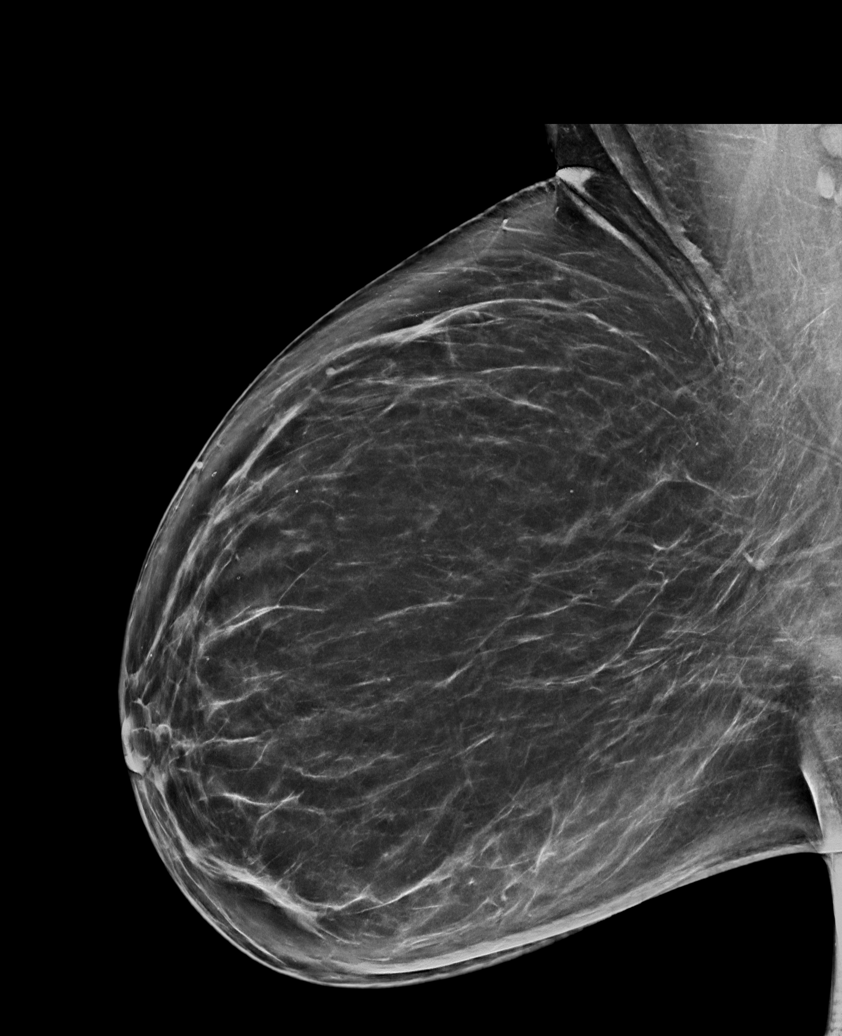

[R CC synth-2D]
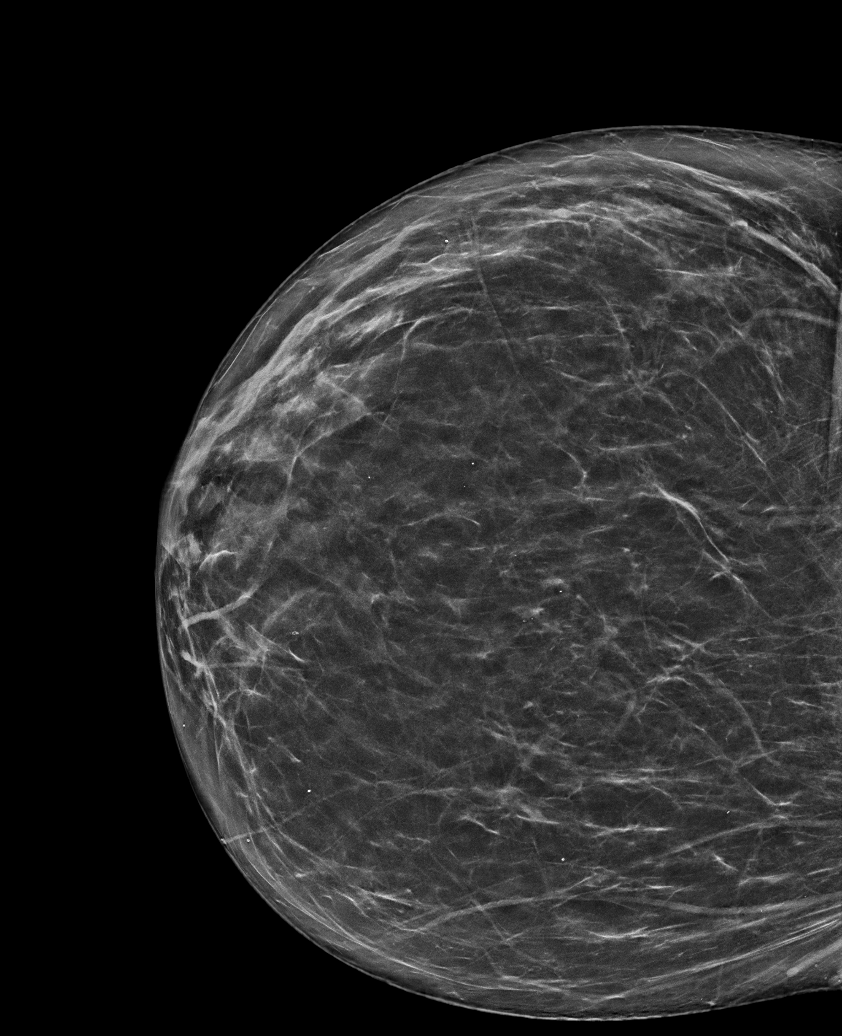

[L MLO synth-2D (1 of 2)]
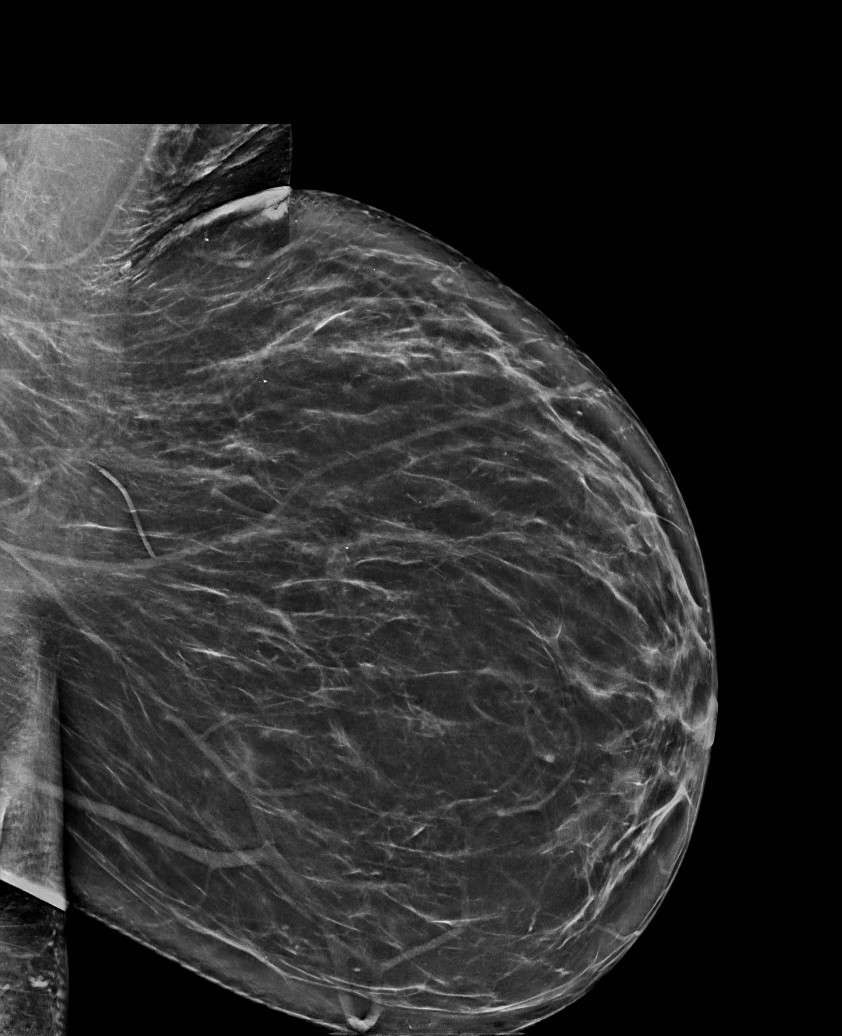

[L MLO synth-2D (2 of 2)]
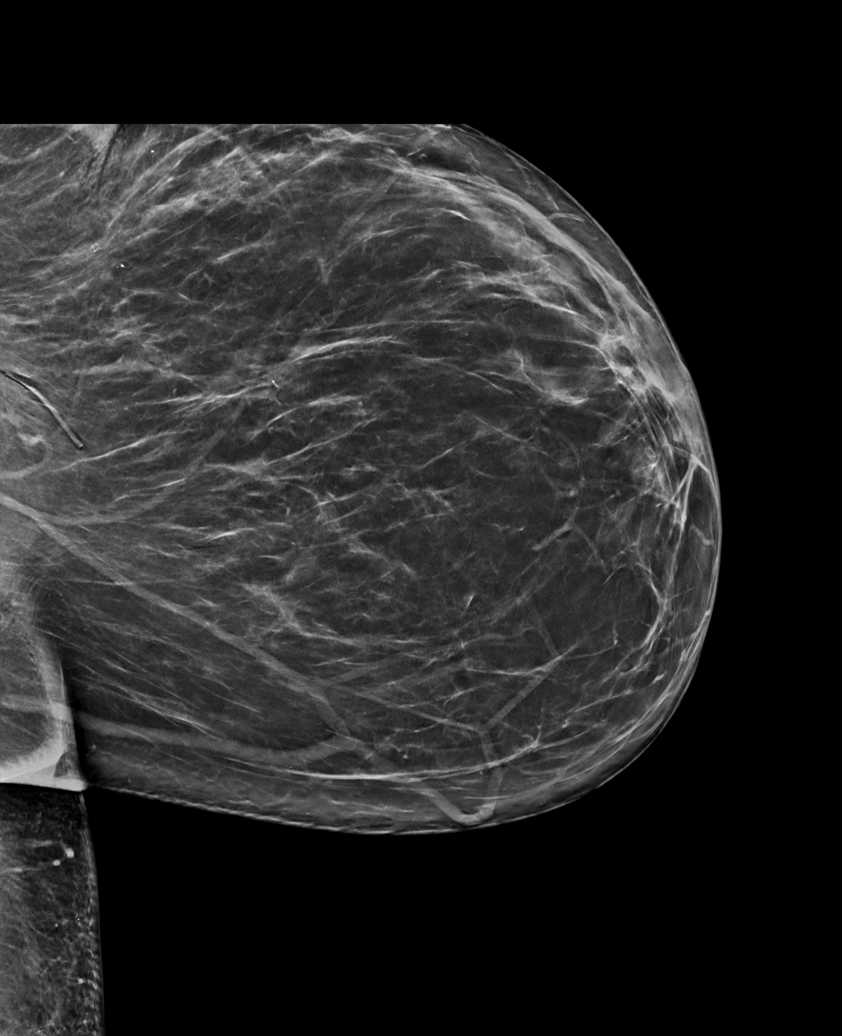

[R CC tomo · tomo slice 41/82.0]
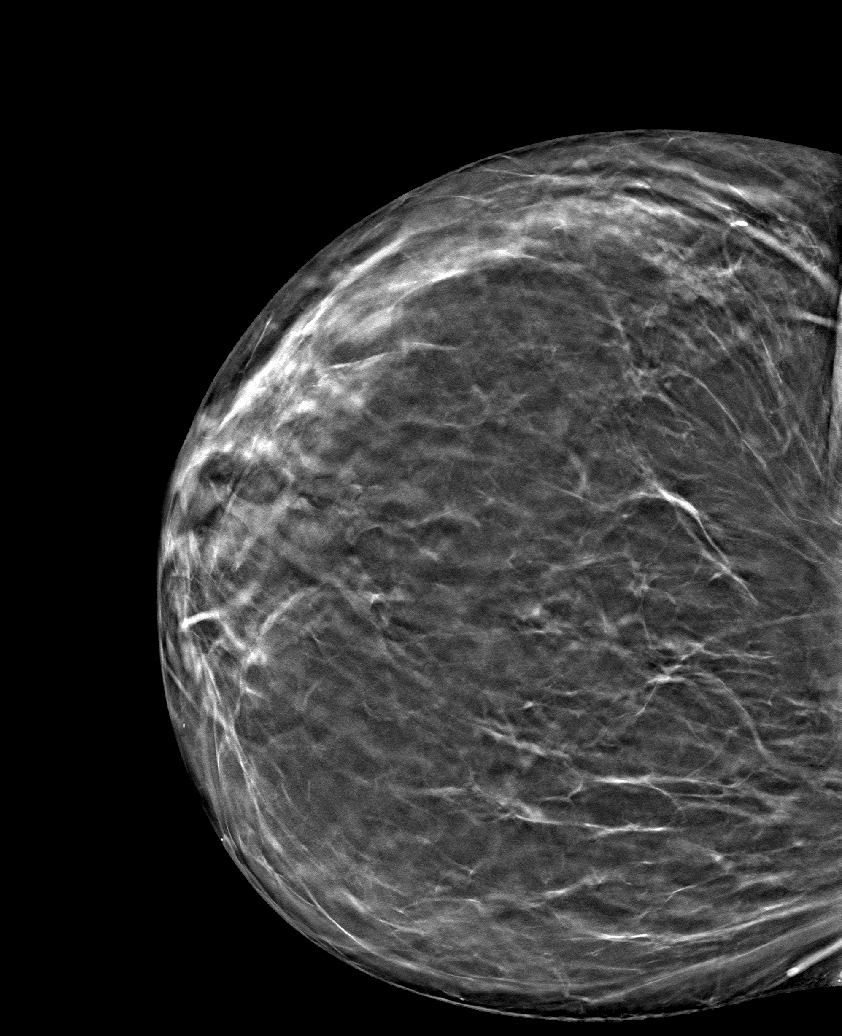

[6 of 30 positions shown; findings below may reference images not displayed]

ACR Breast Density Category b: There are scattered areas of
fibroglandular density.
FINDINGS: In the left breast, a possible mass and 2 groups of calcifications
warrant further evaluation. In the right breast, no findings
suspicious for malignancy.
IMPRESSION: Further evaluation is suggested for a possible mass and 2 groups of
calcifications in the left breast.

RECOMMENDATION:
Diagnostic mammogram and possibly ultrasound of the left breast.
(Code:6B-3-88M)

The patient will be contacted regarding the findings, and additional
imaging will be scheduled.

BI-RADS CATEGORY  0: Incomplete. Need additional imaging evaluation
and/or prior mammograms for comparison.

## 2021-09-16 ENCOUNTER — Other Ambulatory Visit: Payer: Self-pay

## 2021-09-16 ENCOUNTER — Ambulatory Visit (HOSPITAL_COMMUNITY)
Admission: RE | Admit: 2021-09-16 | Discharge: 2021-09-16 | Disposition: A | Discharge status: Home / Self Care | Payer: BC Managed Care – PPO | Source: Ambulatory Visit | Attending: Hematology and Oncology | Admitting: Hematology and Oncology

## 2021-09-16 DIAGNOSIS — N6092 Unspecified benign mammary dysplasia of left breast: Secondary | ICD-10-CM | POA: Insufficient documentation

## 2021-09-16 MED ORDER — GADOBUTROL 1 MMOL/ML IV SOLN
10.0000 mL | Freq: Once | INTRAVENOUS | Status: AC | PRN
  Administered 2021-09-16: 14:00:00 10 mL via INTRAVENOUS

## 2021-09-17 ENCOUNTER — Other Ambulatory Visit: Payer: Self-pay | Admitting: Hematology and Oncology

## 2021-09-17 DIAGNOSIS — R9389 Abnormal findings on diagnostic imaging of other specified body structures: Secondary | ICD-10-CM

## 2021-09-25 ENCOUNTER — Other Ambulatory Visit (HOSPITAL_COMMUNITY): Payer: Self-pay | Admitting: Diagnostic Radiology

## 2021-09-25 ENCOUNTER — Ambulatory Visit
Admission: RE | Admit: 2021-09-25 | Discharge: 2021-09-25 | Disposition: A | Discharge status: Home / Self Care | Payer: BC Managed Care – PPO | Source: Ambulatory Visit | Attending: Hematology and Oncology | Admitting: Hematology and Oncology

## 2021-09-25 DIAGNOSIS — R9389 Abnormal findings on diagnostic imaging of other specified body structures: Secondary | ICD-10-CM

## 2021-09-25 MED ORDER — GADOBUTROL 1 MMOL/ML IV SOLN
10.0000 mL | Freq: Once | INTRAVENOUS | Status: AC | PRN
  Administered 2021-09-25: 10 mL via INTRAVENOUS

## 2021-10-15 ENCOUNTER — Other Ambulatory Visit: Payer: Self-pay | Admitting: Hematology and Oncology

## 2021-10-16 ENCOUNTER — Inpatient Hospital Stay: Payer: BC Managed Care – PPO | Admitting: Hematology and Oncology

## 2021-10-29 ENCOUNTER — Encounter (HOSPITAL_COMMUNITY): Payer: Self-pay | Admitting: *Deleted

## 2021-10-29 ENCOUNTER — Emergency Department (HOSPITAL_COMMUNITY): Payer: BC Managed Care – PPO

## 2021-10-29 ENCOUNTER — Emergency Department (HOSPITAL_COMMUNITY)
Admission: EM | Admit: 2021-10-29 | Discharge: 2021-10-29 | Disposition: A | Discharge status: Home / Self Care | Payer: BC Managed Care – PPO | Attending: Student | Admitting: Student

## 2021-10-29 DIAGNOSIS — R42 Dizziness and giddiness: Secondary | ICD-10-CM | POA: Diagnosis not present

## 2021-10-29 DIAGNOSIS — Z794 Long term (current) use of insulin: Secondary | ICD-10-CM | POA: Diagnosis not present

## 2021-10-29 DIAGNOSIS — E1169 Type 2 diabetes mellitus with other specified complication: Secondary | ICD-10-CM | POA: Insufficient documentation

## 2021-10-29 DIAGNOSIS — I10 Essential (primary) hypertension: Secondary | ICD-10-CM | POA: Insufficient documentation

## 2021-10-29 DIAGNOSIS — Z79899 Other long term (current) drug therapy: Secondary | ICD-10-CM | POA: Diagnosis not present

## 2021-10-29 DIAGNOSIS — Z7984 Long term (current) use of oral hypoglycemic drugs: Secondary | ICD-10-CM | POA: Insufficient documentation

## 2021-10-29 LAB — URINALYSIS, ROUTINE W REFLEX MICROSCOPIC
Bacteria, UA: NONE SEEN
Bilirubin Urine: NEGATIVE
Glucose, UA: >=500 mg/dL — AB
Hgb urine dipstick: NEGATIVE
Ketones, ur: NEGATIVE mg/dL
Leukocytes,Ua: NEGATIVE
Nitrite: NEGATIVE
Protein, ur: NEGATIVE mg/dL
Specific Gravity, Urine: 1.03 (ref 1.005–1.030)
pH: 6 (ref 5.0–8.0)

## 2021-10-29 LAB — CBC
HCT: 43.3 % (ref 36.0–46.0)
Hemoglobin: 13.6 g/dL (ref 12.0–15.0)
MCH: 25.1 pg — ABNORMAL LOW (ref 26.0–34.0)
MCHC: 31.4 g/dL (ref 30.0–36.0)
MCV: 79.9 fL — ABNORMAL LOW (ref 80.0–100.0)
Platelets: 299 10*3/uL (ref 150–400)
RBC: 5.42 MIL/uL — ABNORMAL HIGH (ref 3.87–5.11)
RDW: 13.2 % (ref 11.5–15.5)
WBC: 5.8 10*3/uL (ref 4.0–10.5)
nRBC: 0 % (ref 0.0–0.2)

## 2021-10-29 LAB — BASIC METABOLIC PANEL
Anion gap: 9 (ref 5–15)
BUN: 10 mg/dL (ref 6–20)
CO2: 24 mmol/L (ref 22–32)
Calcium: 9.1 mg/dL (ref 8.9–10.3)
Chloride: 106 mmol/L (ref 98–111)
Creatinine, Ser: 0.65 mg/dL (ref 0.44–1.00)
GFR, Estimated: >60 mL/min (ref >60)
Glucose, Bld: 155 mg/dL — ABNORMAL HIGH (ref 70–99)
Potassium: 3.6 mmol/L (ref 3.5–5.1)
Sodium: 139 mmol/L (ref 135–145)

## 2021-10-29 LAB — POC URINE PREG, ED: Preg Test, Ur: NEGATIVE

## 2021-10-29 MED ORDER — MECLIZINE HCL 25 MG PO TABS
25.0000 mg | ORAL_TABLET | Freq: Three times a day (TID) | ORAL | 0 refills | Status: DC | PRN
Start: 2021-10-29 — End: 2022-01-08

## 2021-10-29 MED ORDER — SODIUM CHLORIDE 0.9 % IV BOLUS
500.0000 mL | Freq: Once | INTRAVENOUS | Status: AC
  Administered 2021-10-29: 500 mL via INTRAVENOUS

## 2021-10-29 MED ORDER — ONDANSETRON 4 MG PO TBDP
4.0000 mg | ORAL_TABLET | Freq: Once | ORAL | Status: AC
Start: 2021-10-29 — End: 2021-10-29
  Administered 2021-10-29: 4 mg via ORAL
  Filled 2021-10-29: qty 1

## 2021-10-29 MED ORDER — LORAZEPAM 1 MG PO TABS
1.0000 mg | ORAL_TABLET | Freq: Once | ORAL | Status: AC
  Administered 2021-10-29: 1 mg via ORAL
  Filled 2021-10-29: qty 1

## 2021-10-29 MED ORDER — MECLIZINE HCL 12.5 MG PO TABS
25.0000 mg | ORAL_TABLET | Freq: Once | ORAL | Status: AC
  Administered 2021-10-29: 25 mg via ORAL
  Filled 2021-10-29: qty 2

## 2021-10-29 NOTE — ED Notes (Signed)
Glucometer did not recognize pt MRN. CBG 146.

## 2021-10-29 NOTE — ED Provider Notes (Signed)
[facility] Provider Note   CSN: [id number] Arrival date & time: 10/29/21  1121     History  Chief Complaint  Patient presents with   Dizziness    Kaylee Montgomery is a 41 y.o. female.  HPI  Patient with medical history including hypertension, diabetes presents with chief complaint of sudden onset of dizziness.  Patient states dizziness started around 7:30 AM, she states dizziness is described as the room spinning, is worsened with head movements, improves with rest, she states that she felt fine when she went to bed around 11 PM last night, she has no associated headaches, change in vision, paresthesias or weakness of upper or lower extremities, denies any recent head trauma, is not on anticoagulant, no history of CVAs or TIAs, no history of A. fib, currently being treated for hypertension, hyperlipidemia, diabetes, she states she is well controlled.  She states that she has had this once in the past with this when she has a left inner ear infection she currently has no ear pain this time.  She has no complaints.  Home Medications Prior to Admission medications   Medication Sig Start Date End Date Taking? Authorizing Provider  meclizine (ANTIVERT) 25 MG tablet Take 1 tablet (25 mg total) by mouth 3 (three) times daily as needed for dizziness. 10/29/21  Yes Marcello Fennel, PA-C  acetaminophen (TYLENOL) 500 MG tablet Take 1,000 mg by mouth every 6 (six) hours as needed for mild pain or moderate pain.     [provider]  amLODipine (NORVASC) 10 MG tablet TAKE 1 TABLET(10 MG) BY MOUTH DAILY 02/22/21   [name], MD  atorvastatin (LIPITOR) 20 MG tablet TAKE 1 TABLET(20 MG) BY MOUTH DAILY 08/26/21   [name], MD  BD PEN NEEDLE NANO 2ND GEN 32G X 4 MM MISC Inject into the skin daily. 07/02/21   [provider]  betamethasone dipropionate 0.05 % cream Apply topically 2 (two) times daily. 12/25/20   [name], MD   cyclobenzaprine (FLEXERIL) 10 MG tablet Take 1 tablet (10 mg total) by mouth at bedtime. 04/23/21   [name], MD  Empagliflozin-metFORMIN HCl ER (SYNJARDY XR) 12.02-999 MG TB24 Take 1 tablet by mouth 2 (two) times daily.    [provider]  gabapentin (NEURONTIN) 300 MG capsule TAKE 1 CAPSULE(300 MG) BY MOUTH AT BEDTIME 07/22/21   [name], MD  glucose blood (ONETOUCH VERIO) test strip Use as instructed bid. E11.65 06/15/19   Cassandria Anger, MD  HEATHER 0.35 MG tablet TAKE 1 TABLET(0.35 MG) BY MOUTH DAILY 07/29/21   [name], MD  insulin degludec (TRESIBA) 100 UNIT/ML FlexTouch Pen Inject 10 Units into the skin at bedtime.    [provider]  Insulin Pen Needle (BD PEN NEEDLE NANO 2ND GEN) 32G X 4 MM MISC See admin instructions. 03/28/21   [provider]  Lancets MISC 1 each by Does not apply route 2 (two) times daily. 06/15/19   Cassandria Anger, MD  Metoprolol Tartrate 75 MG TABS Take 75 mg by mouth 2 (two) times daily. 12/05/19   Suzzanne Cloud, NP  norethindrone (MICRONOR) 0.35 MG tablet 1 tablet 05/24/20   [provider]  tamoxifen (NOLVADEX) 20 MG tablet TAKE 1 TABLET(20 MG) BY MOUTH DAILY 10/15/21   Benay Pike, MD  Tazarotene (ARAZLO) 0.045 % LOTN Apply 1 application topically daily. 07/08/21   Lavonna Monarch, MD  Vitamin D, Ergocalciferol, (DRISDOL) 1.25 MG (50000 UNIT)  CAPS capsule TAKE 1 CAPSULE BY MOUTH EVERY 7 DAYS 09/03/21   [name], MD      Allergies    Bromocriptine mesylate, Depo-medrol [methylprednisolone sodium succ], Dilaudid [hydromorphone hcl], and Imitrex [sumatriptan]    Review of Systems   Review of Systems  Constitutional:  Negative for chills and fever.  Respiratory:  Negative for shortness of breath.   Cardiovascular:  Negative for chest pain.  Gastrointestinal:  Negative for abdominal pain.  Neurological:  Positive for dizziness. Negative for syncope, weakness and headaches.    Physical Exam Updated Vital Signs BP 117/85 (BP Location: Right Arm)    Pulse 90    Temp 97.9 F (36.6 C) (Oral)    Resp 18    Ht 5\' 4"  (1.626 m)    Wt 90.7 kg    SpO2 100%    BMI 34.33 kg/m  Physical Exam Vitals and nursing note reviewed.  Constitutional:      General: She is not in acute distress.    Appearance: She is not ill-appearing.  HENT:     Head: Normocephalic and atraumatic.     Right Ear: Tympanic membrane, ear canal and external ear normal.     Left Ear: Tympanic membrane, ear canal and external ear normal.     Nose: No congestion.     Mouth/Throat:     Mouth: Mucous membranes are moist.     Pharynx: Oropharynx is clear. No oropharyngeal exudate or posterior oropharyngeal erythema.  Eyes:     Extraocular Movements: Extraocular movements intact.     Conjunctiva/sclera: Conjunctivae normal.     Pupils: Pupils are equal, round, and reactive to light.  Cardiovascular:     Rate and Rhythm: Normal rate and regular rhythm.     Pulses: Normal pulses.     Heart sounds: No murmur heard.   No friction rub. No gallop.  Pulmonary:     Effort: No respiratory distress.     Breath sounds: No wheezing, rhonchi or rales.  Abdominal:     Palpations: Abdomen is soft.     Tenderness: There is no abdominal tenderness. There is no right CVA tenderness or left CVA tenderness.  Musculoskeletal:     Right lower leg: No edema.     Left lower leg: No edema.  Skin:    General: Skin is warm and dry.  Neurological:     Mental Status: She is alert.     GCS: GCS eye subscore is 4. GCS verbal subscore is 5. GCS motor subscore is 6.     Cranial Nerves: No cranial nerve deficit.     Sensory: Sensation is intact.     Motor: No weakness.     Coordination: Romberg sign negative. Finger-Nose-Finger Test normal.     Gait: Gait abnormal.     Comments: Cranial nerves II through XII grossly intact, no difficult word finding, no history of words, able follow two-step commands, no unilateral  weakness present, sensation fully intact, she did have a slightly abnormal gait appears to be off balance,  Psychiatric:        Mood and Affect: Mood normal.    ED Results / Procedures / Treatments   Labs (all labs ordered are listed, but only abnormal results are displayed) Labs Reviewed  BASIC METABOLIC PANEL - Abnormal; Notable for the following components:      Result Value   Glucose, Bld 155 (*)    All other components within normal limits  CBC - Abnormal; Notable for  the following components:   RBC 5.42 (*)    MCV 79.9 (*)    MCH 25.1 (*)    All other components within normal limits  URINALYSIS, ROUTINE W REFLEX MICROSCOPIC - Abnormal; Notable for the following components:   Glucose, UA >=500 (*)    All other components within normal limits  POC URINE PREG, ED - Normal  CBG MONITORING, ED    EKG None  Radiology CT Head Wo Contrast  Result Date: 10/29/2021 CLINICAL DATA:  Dizziness, persistent/recurrent, cardiac or vascular cause suspected EXAM: CT HEAD WITHOUT CONTRAST TECHNIQUE: Contiguous axial images were obtained from the base of the skull through the vertex without intravenous contrast. COMPARISON:  None. FINDINGS: Brain: There is no acute intracranial hemorrhage, mass effect, or edema. Gray-white differentiation is preserved. There is no extra-axial fluid collection. Ventricles and sulci are within normal limits in size and configuration. Vascular: No hyperdense vessel or unexpected calcification. Skull: Calvarium is unremarkable. Sinuses/Orbits: No acute finding. Other: Mastoid air cells are clear. Sella is better evaluated on prior MRI. IMPRESSION: No acute intracranial hemorrhage or evidence of acute infarction. Electronically Signed   By: Macy Mis M.D.   On: 10/29/2021 14:29   MR BRAIN WO CONTRAST  Result Date: 10/29/2021 CLINICAL DATA:  Transient ischemic attack (TIA) EXAM: MRI HEAD WITHOUT CONTRAST TECHNIQUE: Multiplanar, multiecho pulse sequences of the  brain and surrounding structures were obtained without intravenous contrast. COMPARISON:  Same day head CT.  MRI 09/30/2019. FINDINGS: Brain: No acute infarction, hemorrhage, hydrocephalus, extra-axial collection or mass lesion. 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 Vascular: Major arterial flow voids are maintained at the skull base. Skull and upper cervical spine: Similar heterogeneous signal in the clivus, as discussed on the prior. Diffuse T1 hypointense marrow in the upper cervical spine, nonspecific but potentially related to anemia me given the patient's reported history of anemia in epic. Sinuses/Orbits: No mastoid effusions. Other: Left mastoid effusion. IMPRESSION: 1. No evidence of acute intracranial abnormality 2. Left mastoid effusion. Electronically Signed   By: Margaretha Sheffield M.D.   On: 10/29/2021 17:07    Procedures Procedures    Medications Ordered in ED Medications  meclizine (ANTIVERT) tablet 25 mg (25 mg Oral Given 10/29/21 1352)  sodium chloride 0.9 % bolus 500 mL (0 mLs Intravenous Stopped 10/29/21 1454)  LORazepam (ATIVAN) tablet 1 mg (1 mg Oral Given 10/29/21 1453)  ondansetron (ZOFRAN-ODT) disintegrating tablet 4 mg (4 mg Oral Given 10/29/21 1454)    ED Course/ Medical Decision Making/ A&P                           Medical Decision Making  This patient presents to the ED for concern of dizziness, this involves an extensive number of treatment options, and is a complaint that carries with it a high risk of complications and morbidity.  The differential diagnosis includes CVA, migraines, vertigo    Additional history obtained:  Additional history obtained from electronic medical record External records from outside source obtained and reviewed including previous neurology notes   Co morbidities that complicate the patient evaluation  Diabetes, hypertension, hyperlipidemia  Social Determinants of Health:  N/A    Lab  Tests:  I Ordered, and personally interpreted labs.  The pertinent results include: CBC unremarkable, BMP shows glucose of 155 UA unremarkable, urine pregnancy negative   Imaging Studies ordered:  I ordered imaging studies including CT head, MRI brain without contrast I independently visualized  and interpreted imaging which showed negative acute findings, MRI shows negative for signs of stroke does show a left mastoid effusion I agree with the radiologist interpretation   Cardiac Monitoring:  The patient was maintained on a cardiac monitor.  I personally viewed and interpreted the cardiac monitored which showed an underlying rhythm of: EKG without signs of ischemia   Medicines ordered and prescription drug management:  I ordered medication including meclizine, Ativan for dizziness I have reviewed the patients home medicines and have made adjustments as needed  Reevaluation:  After the interventions noted above, I reevaluated the patient and found that they have :stayed the same  Is reassessed after meclizine she states she is still feeling dizzy, CT head was negative for acute findings.  Presentation is typical of vertigo will try Ativan and reassess.  Reassessed after Ativan she still dizzy, I am concerned for possible cerebellar stroke will obtain MRI for rule outs.  Continue to monitor.  There was no the patient had left mastoid effusion reassessed the patient she was slightly tender on my exam but there is no ear protrusion, again there is no inner ear infection present, will consult with ENT for further recommendations.  Patient is up-to-date on these recommendations she is agreement this plan she has no complaints is ready for discharge.    Consultations Obtained:  I requested consultation with the Dr. Marcelline Deist,  and discussed lab and imaging findings as well as pertinent plan - they recommend: States this is likely an inner ear infusion without signs infection there is no  need for antibiotics he also does not feel this is the cause of her dizziness, recommend outpatient follow-up.   Rule out low suspicion for internal head bleed and or mass as CT imaging is negative for acute findings.  Low suspicion for CVA she has no focal deficit present my exam, MRI is negative for acute findings..  Low suspicion for dissection of the vertebral or carotid artery as presentation atypical of etiology.  Low suspicion for meningitis as she has no meningeal sign present.  Low suspicion for mastoiditis as she is nontoxic-appearing, afebrile no ear protrusion, internal ear infection present.       Dispostion and problem list  After consideration of the diagnostic results and the patients response to treatment, I feel that the patent would benefit from   Dizziness improved-unclear etiology but this is likely secondary due to vertigo, will provide her with meclizine, provide her with Epley maneuver follow-up with neurology for further evaluation.  Left ear effusion-we will have her follow-up with ENT for further evaluation..             Final Clinical Impression(s) / ED Diagnoses Final diagnoses:  Dizziness    Rx / DC Orders ED Discharge Orders          Ordered    meclizine (ANTIVERT) 25 MG tablet  3 times daily PRN        10/29/21 1744              Marcello Fennel, PA-C 10/29/21 1745    Godfrey Pick, MD 10/30/21 [phone]

## 2021-10-29 NOTE — ED Triage Notes (Signed)
States she woke up feeling dizzy this am

## 2021-10-29 NOTE — Discharge Instructions (Signed)
Dizziness is likely from vertigo, IV new meclizine please use needed for dizziness, have also given you information on how to perform a Epley maneuver this will help with your dizziness.  Please follow-up with neurology for further evaluation. Inner ear effusion please follow-up with ENT for further evaluation.  Come back to the emergency department if you develop chest pain, shortness of breath, severe abdominal pain, uncontrolled nausea, vomiting, diarrhea.

## 2021-11-05 ENCOUNTER — Inpatient Hospital Stay: Payer: BC Managed Care – PPO | Attending: Hematology and Oncology | Admitting: Hematology and Oncology

## 2021-11-05 ENCOUNTER — Encounter: Payer: Self-pay | Admitting: Hematology and Oncology

## 2021-11-05 ENCOUNTER — Other Ambulatory Visit: Payer: Self-pay

## 2021-11-05 VITALS — BP 114/76 | HR 90 | Temp 97.6°F | Resp 18 | Ht 64.0 in | Wt 203.6 lb

## 2021-11-05 DIAGNOSIS — Z79899 Other long term (current) drug therapy: Secondary | ICD-10-CM | POA: Diagnosis not present

## 2021-11-05 DIAGNOSIS — E119 Type 2 diabetes mellitus without complications: Secondary | ICD-10-CM | POA: Diagnosis not present

## 2021-11-05 DIAGNOSIS — G4730 Sleep apnea, unspecified: Secondary | ICD-10-CM | POA: Diagnosis not present

## 2021-11-05 DIAGNOSIS — Z79810 Long term (current) use of selective estrogen receptor modulators (SERMs): Secondary | ICD-10-CM | POA: Insufficient documentation

## 2021-11-05 DIAGNOSIS — Z86018 Personal history of other benign neoplasm: Secondary | ICD-10-CM | POA: Diagnosis not present

## 2021-11-05 DIAGNOSIS — I10 Essential (primary) hypertension: Secondary | ICD-10-CM | POA: Insufficient documentation

## 2021-11-05 DIAGNOSIS — N6092 Unspecified benign mammary dysplasia of left breast: Secondary | ICD-10-CM | POA: Diagnosis present

## 2021-11-05 NOTE — Progress Notes (Signed)
[facility] NOTE  Patient Care Team: [name], MD as PCP - General Fields, Marga Melnick, MD (Inactive) as Consulting Physician (Gastroenterology) Lavonna Monarch, MD as Consulting Physician (Dermatology)  CHIEF COMPLAINTS/PURPOSE OF CONSULTATION:   High risk for breast cancer.  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  During her initial visit we have discussed about tamoxifen. She agreed to try tamoxifen prevention and is here for a follow-up.  Since last visit no new health complaints.  No adverse effects reported from tamoxifen. Physical examination unremarkable, she has large pendulous breasts but no obvious masses or regional adenopathy.  She had MRI guided breast biopsy recently which showed [place].  Have sent an in basket message to Dr. Marlou Starks and engaged in nurse navigator to see if she needs to follow-up with breast surgery team again.  She had history of ADH in the past and had a lumpectomy. She does not necessarily qualify for genetic testing at this time.  She was interested to know more about prophylactic mastectomy.  We have discussed that this is standard of care at this time if someone has deleterious mutations like BRCA1 BRCA2 and PAL B2.  She will continue follow-up with [place] in about 6 months.  She is due for mammogram in May 2023.  She was instructed to call [facility] imaging to schedule mammogram if she does not hear back from them. Mammogram ordered for May 2023  Thank you for consulting [place] the care of this patient.  Please not hesitate to contact us with any additional questions or concerns.  HISTORY OF PRESENTING ILLNESS:  Kaylee Montgomery 41 y.o. female is here because of ADH  Chronology  Mammogram April 2022 which showed possible mass and 2 groups of calcifications in the left  breast. Further diagnostic mammo showed multiple groups of suspicious calcifications in the lateral to lower left breast spanning approximately 8 cm.  1 of these groups of calcifications is associated with the mass which does not have a sonographic correlate.  No evidence of left axillary lymphadenopathy. Biopsy done in May 2022 from the left lower outer quadrant showed atypical lobular hyperplasia second biopsy from the left lower outer mid showed atypical ductal hyperplasia. She underwent left breast lumpectomy with radioactive seed localization x2 on August 1 by Dr. Marlou Starks This showed focal atypical ductal hyperplasia from the left inferior lateral breast.  Left superior lateral breast excision showed focal atypical lobular hyperplasia.  There is intraductal papilloma with calcifications noted in the left deep margin of the superior lateral breast.  No evidence of malignancy. She is referred to high risk breast clinic given her atypical ductal hyperplasia and for further recommendations.  Age at menarche :6 First child birth at birth:15 Use of OCP: 15 yrs No breast cancer in the family No ovarian cancer in the family.  She has DM, HTN at baseline, well controlled according to the patient.  Interval History  She is here for a follow up about using tamoxifen for endocrine prevention. She is doing well. No complaints. No lower extremity asymmetrical swelling, vaginal bleeding or or change in breathing.  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 She does not have a follow-up with breast surgery at this time. Rest of the pertinent 10 point ROS reviewed and negative.  MEDICAL HISTORY:  Past Medical History:  Diagnosis  Date   Breast mass 02/2017   Dental crown present    Diabetes mellitus without complication ([place])    Hypertension    states BP fluctuates; has been on med. x 1 yr.   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    SURGICAL HISTORY: Past Surgical History:  Procedure Laterality Date   BREAST EXCISIONAL BIOPSY Left 2018   Negative   BREAST LUMPECTOMY WITH RADIOACTIVE SEED LOCALIZATION Left 03/05/2017   Procedure: LEFT BREAST LUMPECTOMY WITH RADIOACTIVE SEED LOCALIZATION;  Surgeon: Donnie Mesa, MD;  Location: [facility];  Service: General;  Laterality: Left;   BREAST LUMPECTOMY WITH RADIOACTIVE SEED LOCALIZATION Left 05/20/2021   Procedure: LEFT BREAST LUMPECTOMY WITH RADIOACTIVE SEED LOCALIZATION X2;  Surgeon: Jovita Kussmaul, MD;  Location: [facility];  Service: General;  Laterality: Left;   CESAREAN SECTION     CHOLECYSTECTOMY  08/2017   [facility]   ESOPHAGOGASTRODUODENOSCOPY  07/21/2017   [facility]: Dr. Delrae Alfred. healthy appearing cyst cavity without necrotic material that was decreased in size. Removal of stent scheduled for 11/12.    EUS  06/19/2017   [facility]: 7 x 7.3 cm cyst with necrotic material noted adjacent to the body of the pancreas. FNA without malignancy.   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)   TONSILLECTOMY  age 39    SOCIAL HISTORY: Social History   Socioeconomic History   Marital status: Single    Spouse name: Not on file   Number of children: 1   Years of education: Not on file   Highest education level: Not on file  Occupational History   Occupation: [employer]    Occupation: CASEWORKER    Employer: [employer]  Tobacco Use   Smoking status: Never   Smokeless tobacco: Never  Vaping Use   Vaping Use: Never used  Substance and Sexual Activity   Alcohol use: No    Alcohol/week: 0.0  standard drinks    Comment: never drank regularly or heavily but NO etoh at all since 01/2017   Drug use: No   Sexual activity: Not on file  Other Topics Concern   Not on file  Social History Narrative   Patient is right handed.   Patient drinks 1-2 cups of caffeine daily.   Social Determinants of Health   Financial Resource Strain: Not on file  Food Insecurity: Not on file  Transportation Needs: Not on file  Physical Activity: Not on file  Stress: Not on file  Social Connections: Not on file  Intimate Partner Violence: Not on file    FAMILY HISTORY: Family History  Problem Relation Age of Onset   Heart disease Father        stent   Migraines Father    Migraines Son    Diabetes Maternal Grandfather    Colon cancer Neg Hx     ALLERGIES:  is allergic to bromocriptine mesylate, depo-medrol [methylprednisolone sodium succ], dilaudid [hydromorphone hcl], and imitrex [sumatriptan].  MEDICATIONS:  Current Outpatient Medications  Medication Sig Dispense Refill   acetaminophen (TYLENOL) 500 MG tablet Take  1,000 mg by mouth every 6 (six) hours as needed for mild pain or moderate pain.      amLODipine (NORVASC) 10 MG tablet TAKE 1 TABLET(10 MG) BY MOUTH DAILY 90 tablet 3   atorvastatin (LIPITOR) 20 MG tablet TAKE 1 TABLET(20 MG) BY MOUTH DAILY 90 tablet 3   BD PEN NEEDLE NANO 2ND GEN 32G X 4 MM MISC Inject into the skin daily.     betamethasone dipropionate 0.05 % cream Apply topically 2 (two) times daily. 30 g 0   cyclobenzaprine (FLEXERIL) 10 MG tablet Take 1 tablet (10 mg total) by mouth at bedtime. 90 tablet 3   Empagliflozin-metFORMIN HCl ER (SYNJARDY XR) 12.02-999 MG TB24 Take 1 tablet by mouth 2 (two) times daily.     gabapentin (NEURONTIN) 300 MG capsule TAKE 1 CAPSULE(300 MG) BY MOUTH AT BEDTIME 90 capsule 3   glucose blood (ONETOUCH VERIO) test strip Use as instructed bid. E11.65 100 each 5   HEATHER 0.35 MG tablet TAKE 1 TABLET(0.35 MG) BY MOUTH DAILY 28 tablet 5    insulin degludec (TRESIBA) 100 UNIT/ML FlexTouch Pen Inject 10 Units into the skin at bedtime.     Insulin Pen Needle (BD PEN NEEDLE NANO 2ND GEN) 32G X 4 MM MISC See admin instructions.     Lancets MISC 1 each by Does not apply route 2 (two) times daily. 100 each 5   meclizine (ANTIVERT) 25 MG tablet Take 1 tablet (25 mg total) by mouth 3 (three) times daily as needed for dizziness. 30 tablet 0   Metoprolol Tartrate 75 MG TABS Take 75 mg by mouth 2 (two) times daily. 180 tablet 3   norethindrone (MICRONOR) 0.35 MG tablet 1 tablet     tamoxifen (NOLVADEX) 20 MG tablet TAKE 1 TABLET(20 MG) BY MOUTH DAILY 30 tablet 3   Tazarotene (ARAZLO) 0.045 % LOTN Apply 1 application topically daily. 45 g 2   Vitamin D, Ergocalciferol, (DRISDOL) 1.25 MG (50000 UNIT) CAPS capsule TAKE 1 CAPSULE BY MOUTH EVERY 7 DAYS 12 capsule 1   No current facility-administered medications for this visit.   PHYSICAL EXAMINATION: ECOG PERFORMANCE STATUS: 0 - Asymptomatic  BP 114/76 (BP Location: Left Arm, Patient Position: Sitting)    Pulse 90    Temp 97.6 F (36.4 C) (Temporal)    Resp 18    Ht $R'5\' 4"'LB$  (1.626 m)    Wt 203 lb 9 oz (92.3 kg)    SpO2 100%    BMI 34.94 kg/m    Physical Exam Constitutional:      Appearance: Normal appearance.  HENT:     Head: Normocephalic and atraumatic.  Cardiovascular:     Rate and Rhythm: Normal rate and regular rhythm.     Pulses: Normal pulses.     Heart sounds: Normal heart sounds.  Pulmonary:     Effort: Pulmonary effort is normal.     Breath sounds: Normal breath sounds.  Chest:     Comments: Bilateral large pendulous breasts.  No obvious masses or palpable regional adenopathy noted today. Abdominal:     General: Abdomen is flat. Bowel sounds are normal.     Palpations: Abdomen is soft.  Musculoskeletal:        General: No swelling or tenderness.     Cervical back: Normal range of motion and neck supple. No rigidity.  Lymphadenopathy:     Cervical: No cervical  adenopathy.  Skin:    General: Skin is warm and dry.  Neurological:  General: No focal deficit present.     Mental Status: She is alert.  Psychiatric:        Mood and Affect: Mood normal.    LABORATORY DATA:  I have reviewed the data as listed Lab Results  Component Value Date   WBC 5.8 10/29/2021   HGB 13.6 10/29/2021   HCT 43.3 10/29/2021   MCV 79.9 (L) 10/29/2021   PLT 299 10/29/2021     Chemistry      Component Value Date/Time   NA 139 10/29/2021 1224   NA 140 11/12/2020 1138   K 3.6 10/29/2021 1224   CL 106 10/29/2021 1224   CO2 24 10/29/2021 1224   BUN 10 10/29/2021 1224   BUN 7 11/12/2020 1138   CREATININE 0.65 10/29/2021 1224   CREATININE 0.56 03/11/2019 1013      Component Value Date/Time   CALCIUM 9.1 10/29/2021 1224   ALKPHOS 60 11/12/2020 1138   AST 20 11/12/2020 1138   ALT 27 11/12/2020 1138   BILITOT 1.6 (H) 11/12/2020 1138       RADIOGRAPHIC STUDIES: I have personally reviewed the radiological images as listed and agreed with the findings in the report. CT Head Wo Contrast  Result Date: 10/29/2021 CLINICAL DATA:  Dizziness, persistent/recurrent, cardiac or vascular cause suspected EXAM: CT HEAD WITHOUT CONTRAST TECHNIQUE: Contiguous axial images were obtained from the base of the skull through the vertex without intravenous contrast. COMPARISON:  None. FINDINGS: Brain: There is no acute intracranial hemorrhage, mass effect, or edema. Gray-white differentiation is preserved. There is no extra-axial fluid collection. Ventricles and sulci are within normal limits in size and configuration. Vascular: No hyperdense vessel or unexpected calcification. Skull: Calvarium is unremarkable. Sinuses/Orbits: No acute finding. Other: Mastoid air cells are clear. Sella is better evaluated on prior MRI. IMPRESSION: No acute intracranial hemorrhage or evidence of acute infarction. Electronically Signed   By: Macy Mis M.D.   On: 10/29/2021 14:29   MR BRAIN WO  CONTRAST  Result Date: 10/29/2021 CLINICAL DATA:  Transient ischemic attack (TIA) EXAM: MRI HEAD WITHOUT CONTRAST TECHNIQUE: Multiplanar, multiecho pulse sequences of the brain and surrounding structures were obtained without intravenous contrast. COMPARISON:  Same day head CT.  MRI 09/30/2019. FINDINGS: Brain: No acute infarction, hemorrhage, hydrocephalus, extra-axial collection or mass lesion. 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 Vascular: Major arterial flow voids are maintained at the skull base. Skull and upper cervical spine: Similar heterogeneous signal in the clivus, as discussed on the prior. Diffuse T1 hypointense marrow in the upper cervical spine, nonspecific but potentially related to anemia me given the patient's reported history of anemia in epic. Sinuses/Orbits: No mastoid effusions. Other: Left mastoid effusion. IMPRESSION: 1. No evidence of acute intracranial abnormality 2. Left mastoid effusion. Electronically Signed   By: Margaretha Sheffield M.D.   On: 10/29/2021 17:07     SURGICAL PATHOLOGY  CASE: [id number]  PATIENT: [facility]  Surgical Pathology Report    Clinical History: Left breast atypical ductal hyperplasia (nt)   FINAL MICROSCOPIC DIAGNOSIS:   A. BREAST, LEFT INFERIOR-LATERAL, EXCISION:  - Focal atypical ductal hyperplasia.  - Biopsy site.   B. BREAST, LEFT SUPERIOR-LATERAL, EXCISION:  - Focal atypical lobular hyperplasia.  - Biopsy site.   C. BREAST, LEFT DEEP MARGIN OF SUPERIOR-LATERAL, EXCISION:  - Intraductal papilloma with calcifications.  - Fibrocystic change and columnar cell hyperplasia.  - Biopsy site.   All questions were answered. The patient knows to call the clinic with any problems,  questions or concerns. I spent 20 minutes in the care of this patient including H and P, review of records, counseling and coordination of care.     Benay Pike, MD 11/05/2021 12:27 PM

## 2021-11-12 LAB — CBG MONITORING, ED: Glucose-Capillary: 146 mg/dL — ABNORMAL HIGH (ref 70–99)

## 2021-11-21 ENCOUNTER — Ambulatory Visit (INDEPENDENT_AMBULATORY_CARE_PROVIDER_SITE_OTHER): Payer: BC Managed Care – PPO | Admitting: Family Medicine

## 2021-11-21 ENCOUNTER — Encounter: Payer: Self-pay | Admitting: Family Medicine

## 2021-11-21 ENCOUNTER — Other Ambulatory Visit: Payer: Self-pay

## 2021-11-21 VITALS — BP 113/78 | HR 87 | Resp 16 | Ht 64.0 in | Wt 205.0 lb

## 2021-11-21 DIAGNOSIS — I10 Essential (primary) hypertension: Secondary | ICD-10-CM | POA: Diagnosis not present

## 2021-11-21 DIAGNOSIS — E559 Vitamin D deficiency, unspecified: Secondary | ICD-10-CM | POA: Diagnosis not present

## 2021-11-21 DIAGNOSIS — E1159 Type 2 diabetes mellitus with other circulatory complications: Secondary | ICD-10-CM

## 2021-11-21 DIAGNOSIS — R3989 Other symptoms and signs involving the genitourinary system: Secondary | ICD-10-CM | POA: Diagnosis not present

## 2021-11-21 DIAGNOSIS — E782 Mixed hyperlipidemia: Secondary | ICD-10-CM

## 2021-11-21 LAB — POCT URINALYSIS DIP (CLINITEK)
Bilirubin, UA: NEGATIVE
Glucose, UA: >=1000 mg/dL — AB
Ketones, POC UA: NEGATIVE mg/dL
Leukocytes, UA: NEGATIVE
Nitrite, UA: NEGATIVE
POC PROTEIN,UA: NEGATIVE
Spec Grav, UA: 1.01 (ref 1.010–1.025)
Urobilinogen, UA: 0.2 E.U./dL
pH, UA: 6.5 (ref 5.0–8.0)

## 2021-11-21 MED ORDER — SULFAMETHOXAZOLE-TRIMETHOPRIM 800-160 MG PO TABS: 1.0000 | ORAL_TABLET | Freq: Two times a day (BID) | ORAL | 0 refills | Status: DC

## 2021-11-21 NOTE — Patient Instructions (Addendum)
F/U as before , call if you need me before  Septra is prescribed for 3 days, we will contact you with urine c/s if this is ineffective  Urine to be sent for microalb today as well as culture  Pls get fasting labs  asap, CBC, lipid, cmp and EGFr, TSH and vit D (in computer)   It is important that you exercise regularly at least 30 minutes 5 times a week. If you develop chest pain, have severe difficulty breathing, or feel very tired, stop exercising immediately and seek medical attention  ] Please get eye exam  It is important that you exercise regularly at least 30 minutes 5 times a week. If you develop chest pain, have severe difficulty breathing, or feel very tired, stop exercising immediately and seek medical attention    Thanks for choosing [facility], we consider it a privelige to serve you.

## 2021-11-23 LAB — URINE CULTURE

## 2021-11-25 ENCOUNTER — Encounter: Payer: Self-pay | Admitting: Family Medicine

## 2021-11-25 DIAGNOSIS — R3989 Other symptoms and signs involving the genitourinary system: Secondary | ICD-10-CM | POA: Insufficient documentation

## 2021-11-25 NOTE — Assessment & Plan Note (Signed)
Symptomatic, slightly abn CCUA, send forc/s , treat empirically and f/u Pyush water and void regularly

## 2021-11-25 NOTE — Progress Notes (Signed)
Kaylee Montgomery     MRN: [id number]      DOB: 1981/01/02   HPI Kaylee Montgomery is here with a 2 day h/o pelvic pressure, frequency and light spotting, concerned about possible UTI Denies fever, flank pain or chills  Denies polyuria, polydipsia, blurred vision , or hypoglycemic episodes.  ROS Denies recent fever or chills. Denies sinus pressure, nasal congestion, ear pain or sore throat. Denies chest congestion, productive cough or wheezing. Denies chest pains, palpitations and leg swelling Denies abdominal pain, nausea, vomiting,diarrhea or constipation.   Denies joint pain, swelling and limitation in mobility. Denies headaches, seizures, numbness, or tingling. Denies depression, anxiety or insomnia. Denies skin break down or rash.   PE  BP 113/78    Pulse 87    Resp 16    Ht 5\' 4"  (1.626 m)    Wt 205 lb (93 kg)    SpO2 97%    BMI 35.19 kg/m   Patient alert and oriented and in no cardiopulmonary distress.  HEENT: No facial asymmetry, EOMI,     Neck supple .  Chest: Clear to auscultation bilaterally.  CVS: S1, S2 no murmurs, no S3.Regular rate.  ABD: Soft non tender.   Ext: No edema  MS: Adequate ROM spine, shoulders, hips and knees.  Skin: Intact, no ulcerations or rash noted.  Psych: Good eye contact, normal affect. Memory intact not anxious or depressed appearing.  CNS: CN 2-12 intact, power,  normal throughout.no focal deficits noted.   Assessment & Plan  Suspected UTI Symptomatic, slightly abn CCUA, send forc/s , treat empirically and f/u Pyush water and void regularly  Type 2 diabetes mellitus with vascular disease ([place]) Managed by endo and controled , microalb to be sent  Essential hypertension, benign DASH diet and commitment to daily physical activity for a minimum of 30 minutes discussed and encouraged, as a part of hypertension management. The importance of attaining a healthy weight is also discussed.  BP/Weight 11/21/2021 11/05/2021 10/29/2021  09/03/2021 06/18/2021 10/28/3788 11/24/971  Systolic BP 532 992 426 834 196 222 979  Diastolic BP 78 76 85 72 74 75 78  Wt. (Lbs) 205 203.56 200 202 200.6 196.87 197  BMI 35.19 34.94 34.33 34.67 34.43 33.79 33.81   Controlled, no change in medication     Controlled type 2 diabetes mellitus with hyperglycemia ([place]) Kaylee Montgomery is reminded of the importance of commitment to daily physical activity for 30 minutes or more, as able and the need to limit carbohydrate intake to 30 to 60 grams per meal to help with blood sugar control.   The need to take medication as prescribed, test blood sugar as directed, and to call between visits if there is a concern that blood sugar is uncontrolled is also discussed.  Managed by Endo Updated lab by endo Microalb past due and to be sent Kaylee Montgomery is reminded of the importance of daily foot exam, annual eye examination, and good blood sugar, blood pressure and cholesterol control.  Diabetic Labs Latest Ref Rng & Units 10/29/2021 05/20/2021 11/12/2020 07/06/2020 07/03/2019  HbA1c 4.8 - 5.6 % - - 7.9(H) - -  Microalbumin Not Estab. ug/mL - - - 19.9(H) -  Micro/Creat Ratio 0 - 29 mg/g creat - - - 38(H) -  Chol 100 - 199 mg/dL - - 121 - -  HDL >39 mg/dL - - 40 - -  Calc LDL 0 - 99 mg/dL - - 60 - -  Triglycerides 0 - 149 mg/dL - - 114 - -  Creatinine 0.44 - 1.00 mg/dL 0.65 0.61 0.60 - 0.49   BP/Weight 11/21/2021 11/05/2021 10/29/2021 09/03/2021 06/18/2021 04/20/6202 02/22/9740  Systolic BP 638 453 646 803 212 248 250  Diastolic BP 78 76 85 72 74 75 78  Wt. (Lbs) 205 203.56 200 202 200.6 196.87 197  BMI 35.19 34.94 34.33 34.67 34.43 33.79 33.81   Foot/eye exam completion dates 07/05/2020 05/25/2019  Foot Form Completion Done Done      Managed by Endo

## 2021-11-25 NOTE — Assessment & Plan Note (Signed)
Managed by endo and controled , microalb to be sent

## 2021-11-25 NOTE — Assessment & Plan Note (Addendum)
Kaylee Montgomery is reminded of the importance of commitment to daily physical activity for 30 minutes or more, as able and the need to limit carbohydrate intake to 30 to 60 grams per meal to help with blood sugar control.   The need to take medication as prescribed, test blood sugar as directed, and to call between visits if there is a concern that blood sugar is uncontrolled is also discussed.  Managed by Endo Updated lab by endo Microalb past due and to be sent Kaylee Montgomery is reminded of the importance of daily foot exam, annual eye examination, and good blood sugar, blood pressure and cholesterol control.  Diabetic Labs Latest Ref Rng & Units 10/29/2021 05/20/2021 11/12/2020 07/06/2020 07/03/2019  HbA1c 4.8 - 5.6 % - - 7.9(H) - -  Microalbumin Not Estab. ug/mL - - - 19.9(H) -  Micro/Creat Ratio 0 - 29 mg/g creat - - - 38(H) -  Chol 100 - 199 mg/dL - - 121 - -  HDL >39 mg/dL - - 40 - -  Calc LDL 0 - 99 mg/dL - - 60 - -  Triglycerides 0 - 149 mg/dL - - 114 - -  Creatinine 0.44 - 1.00 mg/dL 0.65 0.61 0.60 - 0.49   BP/Weight 11/21/2021 11/05/2021 10/29/2021 09/03/2021 06/18/2021 01/24/8031 10/21/2480  Systolic BP 500 370 488 891 694 503 888  Diastolic BP 78 76 85 72 74 75 78  Wt. (Lbs) 205 203.56 200 202 200.6 196.87 197  BMI 35.19 34.94 34.33 34.67 34.43 33.79 33.81   Foot/eye exam completion dates 07/05/2020 05/25/2019  Foot Form Completion Done Done      Managed by Endo

## 2021-11-25 NOTE — Assessment & Plan Note (Signed)
DASH diet and commitment to daily physical activity for a minimum of 30 minutes discussed and encouraged, as a part of hypertension management. The importance of attaining a healthy weight is also discussed.  BP/Weight 11/21/2021 11/05/2021 10/29/2021 09/03/2021 06/18/2021 0/06/9277 0/0/4471  Systolic BP 580 638 685 488 301 415 973  Diastolic BP 78 76 85 72 74 75 78  Wt. (Lbs) 205 203.56 200 202 200.6 196.87 197  BMI 35.19 34.94 34.33 34.67 34.43 33.79 33.81   Controlled, no change in medication

## 2021-11-28 ENCOUNTER — Inpatient Hospital Stay: Payer: BC Managed Care – PPO | Attending: Hematology and Oncology | Admitting: Hematology and Oncology

## 2021-11-28 ENCOUNTER — Telehealth: Payer: Self-pay

## 2021-11-28 NOTE — Telephone Encounter (Signed)
Attempted to call patient on primary number regarding today's appointment. No answer. Left voicemail providing patient with phone number to reschedule appointments.   

## 2022-01-08 ENCOUNTER — Telehealth: Payer: Self-pay

## 2022-01-08 ENCOUNTER — Encounter: Payer: Self-pay | Admitting: Gastroenterology

## 2022-01-08 ENCOUNTER — Other Ambulatory Visit: Payer: Self-pay

## 2022-01-08 ENCOUNTER — Ambulatory Visit: Payer: BC Managed Care – PPO | Admitting: Gastroenterology

## 2022-01-08 VITALS — BP 118/74 | HR 89 | Temp 97.3°F | Ht 64.0 in | Wt 201.2 lb

## 2022-01-08 DIAGNOSIS — R1319 Other dysphagia: Secondary | ICD-10-CM

## 2022-01-08 DIAGNOSIS — R1013 Epigastric pain: Secondary | ICD-10-CM | POA: Insufficient documentation

## 2022-01-08 DIAGNOSIS — R1310 Dysphagia, unspecified: Secondary | ICD-10-CM | POA: Insufficient documentation

## 2022-01-08 NOTE — Progress Notes (Signed)
? ? ? ?GI Office Note   ? ?Referring Provider: [name], MD ?Primary Care Physician:  [name], MD  ?Primary Gastroenterologist: Elon Alas. Abbey Chatters, DO ? ? ?Chief Complaint  ? ?Chief Complaint  ?Patient presents with  ? Abdominal Pain  ?  Hurts in middle of stomach after eating and feels like food is stuck there. Also hurts if lying flat.   ? ? ?History of Present Illness  ? ?Kaylee Montgomery is a 41 y.o. female presenting today for abdominal pain.  Her last appointment was via telephone visit May 2020.  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  Underwent EUS with FNA with no evidence of malignancy.  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 She had endoscopic necrosectomy. Removal of stent 08/31/17. Cholecystectomy Nov 2018. Last CT May 4193 with uncomplicated pseudocyst, tail of pancreas.  Possibility of repeating CT discussed at 2020 visit however given she was clinically asymptomatic, patient elected to hold off on imaging noting that she is a difficult stick and requires multiple attempts for IV access.  ? 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 ? ?Today: Had been doing well with regards to the pancreas.  However, 1-2 weeks, she developed epigastric discomfort, like food sticking in the lower substernal region during meal. Crampy type pain. Bloated feeling in upper abdomen. Tried Pepto without results. No vomiting. No heartburn. Afraid to eat. Tolerates liquids. BM does not give relief. Mostly having regular BMs. Certain foods cause diarrhea, tries to avoid dairy. No melena, brbpr. No recent ASA/NSAIDs.  ? ?Medications  ? ?Current Outpatient Medications  ?Medication Sig Dispense Refill  ? acetaminophen (TYLENOL) 500 MG tablet Take 1,000 mg by mouth every 6 (six) hours as needed for mild pain or moderate pain.     ? amLODipine (NORVASC)  10 MG tablet TAKE 1 TABLET(10 MG) BY MOUTH DAILY 90 tablet 3  ? atorvastatin (LIPITOR) 20 MG tablet TAKE 1 TABLET(20 MG) BY MOUTH DAILY 90 tablet 3  ? BD PEN NEEDLE NANO 2ND GEN 32G X 4 MM MISC Inject into the skin daily.    ? cyclobenzaprine (FLEXERIL) 10 MG tablet Take 1 tablet (10 mg total) by mouth at bedtime. 90 tablet 3  ? Empagliflozin-metFORMIN HCl ER (SYNJARDY XR) 12.02-999 MG TB24 Take 1 tablet by mouth 2 (two) times daily.    ? gabapentin (NEURONTIN) 300 MG capsule TAKE 1 CAPSULE(300 MG) BY MOUTH AT BEDTIME 90 capsule 3  ? glucose blood (ONETOUCH VERIO) test strip Use as instructed bid. E11.65 100 each 5  ? HEATHER 0.35 MG tablet TAKE 1 TABLET(0.35 MG) BY MOUTH DAILY 28 tablet 5  ? insulin degludec (TRESIBA) 100 UNIT/ML FlexTouch Pen Inject 10 Units into the skin at bedtime.    ? Insulin Pen Needle (BD PEN NEEDLE NANO 2ND GEN) 32G X 4 MM MISC See admin instructions.    ? JANUVIA 100 MG tablet Take 100 mg by mouth daily.    ? Lancets MISC 1 each by Does not apply route 2 (two) times daily. 100 each 5  ? Metoprolol Tartrate 75 MG TABS Take 75 mg by mouth 2 (two) times daily. 180 tablet 3  ? Vitamin D, Ergocalciferol, (DRISDOL) 1.25 MG (50000 UNIT) CAPS capsule TAKE 1 CAPSULE BY MOUTH EVERY 7 DAYS 12 capsule 1  ? ?No current facility-administered medications for this visit.  ? ? ?Allergies  ? ?  Allergies as of 01/08/2022 - Review Complete 01/08/2022  ?Allergen Reaction Noted  ? Bromocriptine mesylate Nausea Only 07/03/2008  ? Depo-medrol [methylprednisolone sodium succ] Itching and Rash 01/29/2015  ? Dilaudid [hydromorphone hcl] Itching 06/26/2017  ? Imitrex [sumatriptan] Rash 03/29/2015  ? ?Past Medical History:  ?Diagnosis Date  ? Breast mass 02/2017  ? Dental crown present   ? Diabetes mellitus without complication ([place])   ? Hypertension   ? states BP fluctuates; has been on med. x 1 yr.  ? Migraines   ? 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 large pancreatic cyst  ? Pituitary adenoma ([place])   ? Seasonal  allergies   ? Sleep apnea   ? no CPAP use in > 6 mos.  ? ?Past Medical History:  ?Diagnosis Date  ? Breast mass 02/2017  ? Dental crown present   ? Diabetes mellitus without complication ([place])   ? Hypertension   ? states BP fluctuates; has been on med. x 1 yr.  ? Migraines   ? 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 large pancreatic cyst  ? Pituitary adenoma ([place])   ? Seasonal allergies   ? Sleep apnea   ? no CPAP use in > 6 mos.  ? ?Family History  ?Problem Relation Age of Onset  ? Heart disease Father   ?     stent  ? Migraines Father   ? Migraines Son   ? Diabetes Maternal Grandfather   ? Colon cancer Neg Hx   ? ?Social History  ? ?Tobacco Use  ? Smoking status: Never  ? Smokeless tobacco: Never  ?Vaping Use  ? Vaping Use: Never used  ?Substance Use Topics  ? Alcohol use: No  ?  Alcohol/week: 0.0 standard drinks  ?  Comment: never drank regularly or heavily but NO etoh at all since 01/2017  ? Drug use: No  ? ? ?  ?Review of Systems  ? ?General: Negative for  weight loss, fever, chills, fatigue, weakness. See hpi ?ENT: Negative for hoarseness, difficulty swallowing , nasal congestion. ?CV: Negative for chest pain, angina, palpitations, dyspnea on exertion, peripheral edema.  ?Respiratory: Negative for dyspnea at rest, dyspnea on exertion, cough, sputum, wheezing.  ?GI: See history of present illness. ?GU:  Negative for dysuria, hematuria, urinary incontinence, urinary frequency, nocturnal urination.  ?Endo: Negative for unusual weight change.  ?   ?Physical Exam  ? ?BP 118/74 (BP Location: Right Arm, Patient Position: Sitting, Cuff Size: Large)   Pulse 89   Temp (!) 97.3 ?F (36.3 ?C) (Temporal)   Ht '5\' 4"'$  (1.626 m)   Wt 201 lb 3.2 oz (91.3 kg)   SpO2 98%   BMI 34.54 kg/m?  ?  ?General: Well-nourished, well-developed in no acute distress.  ?Eyes: No icterus. ?Mouth: masked ?Lungs: Clear to auscultation bilaterally.  ?Heart: Regular rate and rhythm, no murmurs rubs or gallops.  ?Abdomen: Bowel sounds are normal,  nondistended, no hepatosplenomegaly or masses,  ?no abdominal bruits or hernia , no rebound or guarding. Moderate epigastric tenderness ?Rectal: not performed  ?Extremities: No lower extremity edema. No clubbing or deformities. ?Neuro: Alert and oriented x 4   ?Skin: Warm and dry, no jaundice.   ?Psych: Alert and cooperative, normal mood and affect. ? ?Labs  ? ?Lab Results  ?Component Value Date  ? CREATININE 0.65 10/29/2021  ? BUN 10 10/29/2021  ? NA 139 10/29/2021  ? K 3.6 10/29/2021  ? CL 106 10/29/2021  ? CO2 24 10/29/2021  ? ?Lab Results  ?Component Value Date  ?  ALT 27 11/12/2020  ? AST 20 11/12/2020  ? ALKPHOS 60 11/12/2020  ? BILITOT 1.6 (H) 11/12/2020  ? ?Lab Results  ?Component Value Date  ? WBC 5.8 10/29/2021  ? HGB 13.6 10/29/2021  ? HCT 43.3 10/29/2021  ? MCV 79.9 (L) 10/29/2021  ? PLT 299 10/29/2021  ? ? ?Imaging Studies  ? ?No results found. ? ?Assessment  ? ?41 year old female with complicated past medical history consisting of prior acute necrotizing pancreatitis with large cyst formation requiring endoscopic necrosectomy with stent placement and subsequent removal, cholecystectomy as outlined above who presents with 1 to 2-week history of acute onset epigastric pain ? ?Epigastric pain: Postprandial crampy discomfort in the lower sternal region/epigastric region associated with fullness, bloating, fear to eat.  No typical heartburn.  No recent NSAIDs or aspirin use.  Bowel movements are regular.  Etiology unclear, differential includes complicated hiatal hernia, gastritis, peptic ulcer disease, esophageal stricture/esophagitis, less likely related to pancreas/pancreatic cyst but not excluded.  Needs further evaluation, initially with labs and endoscopy.  If findings unremarkable would consider repeating CT. ? ? ?PLAN  ? ?Labs to evaluate abdominal pain, CBC, CMET, lipase. ?Hold off on starting PPI until procedure per discussion with patient. ?EGD with possible esophageal dilation with Dr. Abbey Chatters.   ASA 2.  I have discussed the risks, alternatives, benefits with regards to but not limited to the risk of reaction to medication, bleeding, infection, perforation and the patient is agreeable to proceed. Written c

## 2022-01-08 NOTE — H&P (View-Only) (Signed)
? ? ? ?GI Office Note   ? ?Referring Provider: [name], MD ?Primary Care Physician:  [name], MD  ?Primary Gastroenterologist: Elon Alas. Abbey Chatters, DO ? ? ?Chief Complaint  ? ?Chief Complaint  ?Patient presents with  ? Abdominal Pain  ?  Hurts in middle of stomach after eating and feels like food is stuck there. Also hurts if lying flat.   ? ? ?History of Present Illness  ? ?Kaylee Montgomery is a 41 y.o. female presenting today for abdominal pain.  Her last appointment was via telephone visit May 2020.  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  Underwent EUS with FNA with no evidence of malignancy.  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 She had endoscopic necrosectomy. Removal of stent 08/31/17. Cholecystectomy Nov 2018. Last CT May 2671 with uncomplicated pseudocyst, tail of pancreas.  Possibility of repeating CT discussed at 2020 visit however given she was clinically asymptomatic, patient elected to hold off on imaging noting that she is a difficult stick and requires multiple attempts for IV access.  ? 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 ? ?Today: Had been doing well with regards to the pancreas.  However, 1-2 weeks, she developed epigastric discomfort, like food sticking in the lower substernal region during meal. Crampy type pain. Bloated feeling in upper abdomen. Tried Pepto without results. No vomiting. No heartburn. Afraid to eat. Tolerates liquids. BM does not give relief. Mostly having regular BMs. Certain foods cause diarrhea, tries to avoid dairy. No melena, brbpr. No recent ASA/NSAIDs.  ? ?Medications  ? ?Current Outpatient Medications  ?Medication Sig Dispense Refill  ? acetaminophen (TYLENOL) 500 MG tablet Take 1,000 mg by mouth every 6 (six) hours as needed for mild pain or moderate pain.     ? amLODipine (NORVASC)  10 MG tablet TAKE 1 TABLET(10 MG) BY MOUTH DAILY 90 tablet 3  ? atorvastatin (LIPITOR) 20 MG tablet TAKE 1 TABLET(20 MG) BY MOUTH DAILY 90 tablet 3  ? BD PEN NEEDLE NANO 2ND GEN 32G X 4 MM MISC Inject into the skin daily.    ? cyclobenzaprine (FLEXERIL) 10 MG tablet Take 1 tablet (10 mg total) by mouth at bedtime. 90 tablet 3  ? Empagliflozin-metFORMIN HCl ER (SYNJARDY XR) 12.02-999 MG TB24 Take 1 tablet by mouth 2 (two) times daily.    ? gabapentin (NEURONTIN) 300 MG capsule TAKE 1 CAPSULE(300 MG) BY MOUTH AT BEDTIME 90 capsule 3  ? glucose blood (ONETOUCH VERIO) test strip Use as instructed bid. E11.65 100 each 5  ? HEATHER 0.35 MG tablet TAKE 1 TABLET(0.35 MG) BY MOUTH DAILY 28 tablet 5  ? insulin degludec (TRESIBA) 100 UNIT/ML FlexTouch Pen Inject 10 Units into the skin at bedtime.    ? Insulin Pen Needle (BD PEN NEEDLE NANO 2ND GEN) 32G X 4 MM MISC See admin instructions.    ? JANUVIA 100 MG tablet Take 100 mg by mouth daily.    ? Lancets MISC 1 each by Does not apply route 2 (two) times daily. 100 each 5  ? Metoprolol Tartrate 75 MG TABS Take 75 mg by mouth 2 (two) times daily. 180 tablet 3  ? Vitamin D, Ergocalciferol, (DRISDOL) 1.25 MG (50000 UNIT) CAPS capsule TAKE 1 CAPSULE BY MOUTH EVERY 7 DAYS 12 capsule 1  ? ?No current facility-administered medications for this visit.  ? ? ?Allergies  ? ?  Allergies as of 01/08/2022 - Review Complete 01/08/2022  ?Allergen Reaction Noted  ? Bromocriptine mesylate Nausea Only 07/03/2008  ? Depo-medrol [methylprednisolone sodium succ] Itching and Rash 01/29/2015  ? Dilaudid [hydromorphone hcl] Itching 06/26/2017  ? Imitrex [sumatriptan] Rash 03/29/2015  ? ?Past Medical History:  ?Diagnosis Date  ? Breast mass 02/2017  ? Dental crown present   ? Diabetes mellitus without complication ([place])   ? Hypertension   ? states BP fluctuates; has been on med. x 1 yr.  ? Migraines   ? 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 large pancreatic cyst  ? Pituitary adenoma ([place])   ? Seasonal  allergies   ? Sleep apnea   ? no CPAP use in > 6 mos.  ? ?Past Medical History:  ?Diagnosis Date  ? Breast mass 02/2017  ? Dental crown present   ? Diabetes mellitus without complication ([place])   ? Hypertension   ? states BP fluctuates; has been on med. x 1 yr.  ? Migraines   ? 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 large pancreatic cyst  ? Pituitary adenoma ([place])   ? Seasonal allergies   ? Sleep apnea   ? no CPAP use in > 6 mos.  ? ?Family History  ?Problem Relation Age of Onset  ? Heart disease Father   ?     stent  ? Migraines Father   ? Migraines Son   ? Diabetes Maternal Grandfather   ? Colon cancer Neg Hx   ? ?Social History  ? ?Tobacco Use  ? Smoking status: Never  ? Smokeless tobacco: Never  ?Vaping Use  ? Vaping Use: Never used  ?Substance Use Topics  ? Alcohol use: No  ?  Alcohol/week: 0.0 standard drinks  ?  Comment: never drank regularly or heavily but NO etoh at all since 01/2017  ? Drug use: No  ? ? ?  ?Review of Systems  ? ?General: Negative for  weight loss, fever, chills, fatigue, weakness. See hpi ?ENT: Negative for hoarseness, difficulty swallowing , nasal congestion. ?CV: Negative for chest pain, angina, palpitations, dyspnea on exertion, peripheral edema.  ?Respiratory: Negative for dyspnea at rest, dyspnea on exertion, cough, sputum, wheezing.  ?GI: See history of present illness. ?GU:  Negative for dysuria, hematuria, urinary incontinence, urinary frequency, nocturnal urination.  ?Endo: Negative for unusual weight change.  ?   ?Physical Exam  ? ?BP 118/74 (BP Location: Right Arm, Patient Position: Sitting, Cuff Size: Large)   Pulse 89   Temp (!) 97.3 ?F (36.3 ?C) (Temporal)   Ht '5\' 4"'$  (1.626 m)   Wt 201 lb 3.2 oz (91.3 kg)   SpO2 98%   BMI 34.54 kg/m?  ?  ?General: Well-nourished, well-developed in no acute distress.  ?Eyes: No icterus. ?Mouth: masked ?Lungs: Clear to auscultation bilaterally.  ?Heart: Regular rate and rhythm, no murmurs rubs or gallops.  ?Abdomen: Bowel sounds are normal,  nondistended, no hepatosplenomegaly or masses,  ?no abdominal bruits or hernia , no rebound or guarding. Moderate epigastric tenderness ?Rectal: not performed  ?Extremities: No lower extremity edema. No clubbing or deformities. ?Neuro: Alert and oriented x 4   ?Skin: Warm and dry, no jaundice.   ?Psych: Alert and cooperative, normal mood and affect. ? ?Labs  ? ?Lab Results  ?Component Value Date  ? CREATININE 0.65 10/29/2021  ? BUN 10 10/29/2021  ? NA 139 10/29/2021  ? K 3.6 10/29/2021  ? CL 106 10/29/2021  ? CO2 24 10/29/2021  ? ?Lab Results  ?Component Value Date  ?  ALT 27 11/12/2020  ? AST 20 11/12/2020  ? ALKPHOS 60 11/12/2020  ? BILITOT 1.6 (H) 11/12/2020  ? ?Lab Results  ?Component Value Date  ? WBC 5.8 10/29/2021  ? HGB 13.6 10/29/2021  ? HCT 43.3 10/29/2021  ? MCV 79.9 (L) 10/29/2021  ? PLT 299 10/29/2021  ? ? ?Imaging Studies  ? ?No results found. ? ?Assessment  ? ?41 year old female with complicated past medical history consisting of prior acute necrotizing pancreatitis with large cyst formation requiring endoscopic necrosectomy with stent placement and subsequent removal, cholecystectomy as outlined above who presents with 1 to 2-week history of acute onset epigastric pain ? ?Epigastric pain: Postprandial crampy discomfort in the lower sternal region/epigastric region associated with fullness, bloating, fear to eat.  No typical heartburn.  No recent NSAIDs or aspirin use.  Bowel movements are regular.  Etiology unclear, differential includes complicated hiatal hernia, gastritis, peptic ulcer disease, esophageal stricture/esophagitis, less likely related to pancreas/pancreatic cyst but not excluded.  Needs further evaluation, initially with labs and endoscopy.  If findings unremarkable would consider repeating CT. ? ? ?PLAN  ? ?Labs to evaluate abdominal pain, CBC, CMET, lipase. ?Hold off on starting PPI until procedure per discussion with patient. ?EGD with possible esophageal dilation with Dr. Abbey Chatters.   ASA 2.  I have discussed the risks, alternatives, benefits with regards to but not limited to the risk of reaction to medication, bleeding, infection, perforation and the patient is agreeable to proceed. Written c

## 2022-01-08 NOTE — Telephone Encounter (Signed)
EGD/-/+DIL was scheduled for 01/17/22 at 7:30am during OV. After pt left the office there had been a schedule change for Dr. Abbey Chatters. Pt needed to be rescheduled to another day. ? ?Called pt, EGD-/+DIL rescheduled to 01/14/22 at 2:15pm. Advised her to go to [facility] lab 01/10/22 for pregnancy test. Updated lab letter and procedure instructions sent to pt via [place]. ?

## 2022-01-08 NOTE — Patient Instructions (Addendum)
Please have your labs done as soon as you can. We will be in touch with results. ?Upper endoscopy as scheduled. See separate instructions. ?

## 2022-01-09 ENCOUNTER — Encounter: Payer: Self-pay | Admitting: Gastroenterology

## 2022-01-10 ENCOUNTER — Other Ambulatory Visit (HOSPITAL_COMMUNITY)
Admission: RE | Admit: 2022-01-10 | Discharge: 2022-01-10 | Disposition: A | Discharge status: Home / Self Care | Payer: BC Managed Care – PPO | Source: Ambulatory Visit | Attending: Internal Medicine | Admitting: Internal Medicine

## 2022-01-10 DIAGNOSIS — R1319 Other dysphagia: Secondary | ICD-10-CM | POA: Insufficient documentation

## 2022-01-10 DIAGNOSIS — R1013 Epigastric pain: Secondary | ICD-10-CM | POA: Insufficient documentation

## 2022-01-10 LAB — PREGNANCY, URINE: Preg Test, Ur: NEGATIVE

## 2022-01-11 LAB — CMP14+EGFR
ALT: 12 IU/L (ref 0–32)
AST: 15 IU/L (ref 0–40)
Albumin/Globulin Ratio: 1.4 (ref 1.2–2.2)
Albumin: 4.2 g/dL (ref 3.8–4.8)
Alkaline Phosphatase: 38 IU/L — ABNORMAL LOW (ref 44–121)
BUN/Creatinine Ratio: 9 (ref 9–23)
BUN: 6 mg/dL (ref 6–24)
Bilirubin Total: 0.7 mg/dL (ref 0.0–1.2)
CO2: 21 mmol/L (ref 20–29)
Calcium: 9.3 mg/dL (ref 8.7–10.2)
Chloride: 106 mmol/L (ref 96–106)
Creatinine, Ser: 0.68 mg/dL (ref 0.57–1.00)
Globulin, Total: 2.9 g/dL (ref 1.5–4.5)
Glucose: 112 mg/dL — ABNORMAL HIGH (ref 70–99)
Potassium: 3.9 mmol/L (ref 3.5–5.2)
Sodium: 143 mmol/L (ref 134–144)
Total Protein: 7.1 g/dL (ref 6.0–8.5)
eGFR: 111 mL/min/{1.73_m2} (ref >59)

## 2022-01-11 LAB — CBC
Hematocrit: 40.2 % (ref 34.0–46.6)
Hemoglobin: 13.2 g/dL (ref 11.1–15.9)
MCH: 25 pg — ABNORMAL LOW (ref 26.6–33.0)
MCHC: 32.8 g/dL (ref 31.5–35.7)
MCV: 76 fL — ABNORMAL LOW (ref 79–97)
Platelets: 363 10*3/uL (ref 150–450)
RBC: 5.29 x10E6/uL — ABNORMAL HIGH (ref 3.77–5.28)
RDW: 12.4 % (ref 11.7–15.4)
WBC: 5.6 10*3/uL (ref 3.4–10.8)

## 2022-01-11 LAB — LIPASE: Lipase: 32 U/L (ref 14–72)

## 2022-01-11 LAB — LIPID PANEL
Chol/HDL Ratio: 5.9 ratio — ABNORMAL HIGH (ref 0.0–4.4)
Cholesterol, Total: 177 mg/dL (ref 100–199)
HDL: 30 mg/dL — ABNORMAL LOW (ref >39)
LDL Chol Calc (NIH): 125 mg/dL — ABNORMAL HIGH (ref 0–99)
Triglycerides: 121 mg/dL (ref 0–149)
VLDL Cholesterol Cal: 22 mg/dL (ref 5–40)

## 2022-01-11 LAB — TSH: TSH: 2.46 u[IU]/mL (ref 0.450–4.500)

## 2022-01-11 LAB — VITAMIN D 25 HYDROXY (VIT D DEFICIENCY, FRACTURES): Vit D, 25-Hydroxy: 52.8 ng/mL (ref 30.0–100.0)

## 2022-01-14 ENCOUNTER — Ambulatory Visit (HOSPITAL_COMMUNITY): Payer: BC Managed Care – PPO | Admitting: Anesthesiology

## 2022-01-14 ENCOUNTER — Other Ambulatory Visit: Payer: Self-pay

## 2022-01-14 ENCOUNTER — Ambulatory Visit (HOSPITAL_COMMUNITY)
Admission: RE | Admit: 2022-01-14 | Discharge: 2022-01-14 | Disposition: A | Discharge status: Home / Self Care | Payer: BC Managed Care – PPO | Attending: Internal Medicine | Admitting: Internal Medicine

## 2022-01-14 ENCOUNTER — Encounter (HOSPITAL_COMMUNITY)
Admission: RE | Disposition: A | Discharge status: Home / Self Care | Payer: Self-pay | Source: Home / Self Care | Attending: Internal Medicine

## 2022-01-14 ENCOUNTER — Encounter (HOSPITAL_COMMUNITY): Payer: Self-pay

## 2022-01-14 DIAGNOSIS — R1013 Epigastric pain: Secondary | ICD-10-CM | POA: Diagnosis not present

## 2022-01-14 DIAGNOSIS — G4730 Sleep apnea, unspecified: Secondary | ICD-10-CM | POA: Insufficient documentation

## 2022-01-14 DIAGNOSIS — I10 Essential (primary) hypertension: Secondary | ICD-10-CM | POA: Insufficient documentation

## 2022-01-14 DIAGNOSIS — K3189 Other diseases of stomach and duodenum: Secondary | ICD-10-CM | POA: Diagnosis not present

## 2022-01-14 DIAGNOSIS — E119 Type 2 diabetes mellitus without complications: Secondary | ICD-10-CM | POA: Diagnosis not present

## 2022-01-14 DIAGNOSIS — R1310 Dysphagia, unspecified: Secondary | ICD-10-CM | POA: Insufficient documentation

## 2022-01-14 DIAGNOSIS — Z794 Long term (current) use of insulin: Secondary | ICD-10-CM | POA: Insufficient documentation

## 2022-01-14 DIAGNOSIS — Z7984 Long term (current) use of oral hypoglycemic drugs: Secondary | ICD-10-CM | POA: Insufficient documentation

## 2022-01-14 DIAGNOSIS — Z79899 Other long term (current) drug therapy: Secondary | ICD-10-CM | POA: Insufficient documentation

## 2022-01-14 HISTORY — PX: ESOPHAGOGASTRODUODENOSCOPY (EGD) WITH PROPOFOL: SHX5813

## 2022-01-14 HISTORY — PX: BALLOON DILATION: SHX5330

## 2022-01-14 HISTORY — PX: BIOPSY: SHX5522

## 2022-01-14 LAB — GLUCOSE, CAPILLARY: Glucose-Capillary: 104 mg/dL — ABNORMAL HIGH (ref 70–99)

## 2022-01-14 SURGERY — ESOPHAGOGASTRODUODENOSCOPY (EGD) WITH PROPOFOL
Anesthesia: General

## 2022-01-14 MED ORDER — PROPOFOL 10 MG/ML IV BOLUS
INTRAVENOUS | Status: DC | PRN
  Administered 2022-01-14: 100 mg via INTRAVENOUS
  Administered 2022-01-14: 50 mg via INTRAVENOUS

## 2022-01-14 MED ORDER — LACTATED RINGERS IV SOLN: INTRAVENOUS | Status: DC

## 2022-01-14 MED ORDER — LIDOCAINE HCL (CARDIAC) PF 100 MG/5ML IV SOSY
PREFILLED_SYRINGE | INTRAVENOUS | Status: DC | PRN
  Administered 2022-01-14: 50 mg via INTRAVENOUS

## 2022-01-14 NOTE — Discharge Instructions (Signed)
EGD ?Discharge instructions ?Please read the instructions outlined below and refer to this sheet in the next few weeks. These discharge instructions provide you with general information on caring for yourself after you leave the hospital. Your doctor may also give you specific instructions. While your treatment has been planned according to the most current medical practices available, unavoidable complications occasionally occur. If you have any problems or questions after discharge, please call your doctor. ?ACTIVITY ?You may resume your regular activity but move at a slower pace for the next 24 hours.  ?Take frequent rest periods for the next 24 hours.  ?Walking will help expel (get rid of) the air and reduce the bloated feeling in your abdomen.  ?No driving for 24 hours (because of the anesthesia (medicine) used during the test).  ?You may shower.  ?Do not sign any important legal documents or operate any machinery for 24 hours (because of the anesthesia used during the test).  ?NUTRITION ?Drink plenty of fluids.  ?You may resume your normal diet.  ?Begin with a light meal and progress to your normal diet.  ?Avoid alcoholic beverages for 24 hours or as instructed by your caregiver.  ?MEDICATIONS ?You may resume your normal medications unless your caregiver tells you otherwise.  ?WHAT YOU CAN EXPECT TODAY ?You may experience abdominal discomfort such as a feeling of fullness or ?gas? pains.  ?FOLLOW-UP ?Your doctor will discuss the results of your test with you.  ?SEEK IMMEDIATE MEDICAL ATTENTION IF ANY OF THE FOLLOWING OCCUR: ?Excessive nausea (feeling sick to your stomach) and/or vomiting.  ?Severe abdominal pain and distention (swelling).  ?Trouble swallowing.  ?Temperature over 101? F (37.8? C).  ?Rectal bleeding or vomiting of blood.  ? ? ?Your esophagus appeared normal on EGD today.  I did not see any obvious tightening or strictures.  As your swallowing has improved, I elected not to dilate today.  Small  bowel appeared normal.  Your stomach showed a 1 cm nodule.  I took biopsies of this though usually these are inconclusive.  Await pathology results, my office will contact you.  I will further discuss case with advanced endoscopy in [facility] to see if endoscopic ultrasound is warranted. ? ?I hope you have a great rest of your week! ? ?Elon Alas. Abbey Chatters, D.O. ?Gastroenterology and Hepatology ?[facility] ? ?

## 2022-01-14 NOTE — Interval H&P Note (Signed)
History and Physical Interval Note: ? ?01/14/2022 ?1:39 PM ? ?Kaylee Montgomery  has presented today for surgery, with the diagnosis of epigastric pain, dysphagia.  The various methods of treatment have been discussed with the patient and family. After consideration of risks, benefits and other options for treatment, the patient has consented to  Procedure(s) with comments: ?ESOPHAGOGASTRODUODENOSCOPY (EGD) WITH PROPOFOL (N/A) - 2:15 ASA 2. ?BALLOON DILATION (N/A) as a surgical intervention.  The patient's history has been reviewed, patient examined, no change in status, stable for surgery.  I have reviewed the patient's chart and labs.  Questions were answered to the patient's satisfaction.   ? ? ?Eloise Harman ? ? ?

## 2022-01-14 NOTE — Anesthesia Preprocedure Evaluation (Signed)
Anesthesia Evaluation  ?Patient identified by MRN, date of birth, ID band ?Patient awake ? ? ? ?Reviewed: ?Allergy & Precautions, H&P , NPO status , Patient's Chart, lab work & pertinent test results, reviewed documented beta blocker date and time  ? ?Airway ?Mallampati: II ? ?TM Distance: >3 FB ?Neck ROM: full ? ? ? Dental ?no notable dental hx. ?(+) Caps ?  ?Pulmonary ?sleep apnea ,  ?  ?Pulmonary exam normal ?breath sounds clear to auscultation ? ? ? ? ? ? Cardiovascular ?Exercise Tolerance: Good ?hypertension, negative cardio ROS ? ? ?Rhythm:regular Rate:Normal ? ? ?  ?Neuro/Psych ? Headaches, negative psych ROS  ? GI/Hepatic ?negative GI ROS, Neg liver ROS,   ?Endo/Other  ?negative endocrine ROSdiabetes, Type 2 ? Renal/GU ?negative Renal ROS  ?negative genitourinary ?  ?Musculoskeletal ? ? Abdominal ?  ?Peds ? Hematology ? ?(+) Blood dyscrasia, anemia ,   ?Anesthesia Other Findings ? ? Reproductive/Obstetrics ?negative OB ROS ? ?  ? ? ? ? ? ? ? ? ? ? ? ? ? ?  ?  ? ? ? ? ? ? ? ? ?Anesthesia Physical ?Anesthesia Plan ? ?ASA: 2 ? ?Anesthesia Plan: General  ? ?Post-op Pain Management:   ? ?Induction:  ? ?PONV Risk Score and Plan: Propofol infusion ? ?Airway Management Planned:  ? ?Additional Equipment:  ? ?Intra-op Plan:  ? ?Post-operative Plan:  ? ?Informed Consent: I have reviewed the patients History and Physical, chart, labs and discussed the procedure including the risks, benefits and alternatives for the proposed anesthesia with the patient or authorized representative who has indicated his/her understanding and acceptance.  ? ? ? ?Dental Advisory Given ? ?Plan Discussed with: CRNA ? ?Anesthesia Plan Comments:   ? ? ? ? ? ? ?Anesthesia Quick Evaluation ? ?

## 2022-01-14 NOTE — Transfer of Care (Signed)
Immediate Anesthesia Transfer of Care Note ? ?Patient: Kaylee Montgomery ? ?Procedure(s) Performed: ESOPHAGOGASTRODUODENOSCOPY (EGD) WITH PROPOFOL ?BALLOON DILATION ?BIOPSY ? ?Patient Location: Endoscopy Unit ? ?Anesthesia Type:General ? ?Level of Consciousness: awake and alert  ? ?Airway & Oxygen Therapy: Patient Spontanous Breathing ? ?Post-op Assessment: Report given to RN and Post -op Vital signs reviewed and stable ? ?Post vital signs: Reviewed and stable ? ?Last Vitals:  ?Vitals Value Taken Time  ?BP    ?Temp    ?Pulse 106   ?Resp 20   ?SpO2 97%   ? ? ?Last Pain:  ?Vitals:  ? 01/14/22 1346  ?TempSrc:   ?PainSc: 0-No pain  ?   ? ?Patients Stated Pain Goal: 7 (01/14/22 1309) ? ?Complications: No notable events documented. ?

## 2022-01-14 NOTE — Op Note (Signed)
[facility] ?Patient Name: Kaylee Montgomery ?Procedure Date: 01/14/2022 1:41 PM ?MRN: [id number] ?Date of Birth: [birth year] ?Attending MD: Elon Alas. Abbey Chatters , DO ?CSN: [id number] ?Age: 41 ?Admit Type: Outpatient ?Procedure:                Upper GI endoscopy ?Indications:              Epigastric abdominal pain, Dysphagia ?Providers:                Elon Alas. Abbey Chatters, DO, Caprice Kluver, Raphael Gibney,  ?                          Technician ?Referring MD:              ?Medicines:                See the Anesthesia note for documentation of the  ?                          administered medications ?Complications:            No immediate complications. ?Estimated Blood Loss:     Estimated blood loss was minimal. ?Procedure:                Pre-Anesthesia Assessment: ?                          - The anesthesia plan was to use monitored  ?                          anesthesia care (MAC). ?                          After obtaining informed consent, the endoscope was  ?                          passed under direct vision. Throughout the  ?                          procedure, the patient's blood pressure, pulse, and  ?                          oxygen saturations were monitored continuously. The  ?                          GIF-H190 ([id number]) scope was introduced through the  ?                          mouth, and advanced to the second part of duodenum.  ?                          The upper GI endoscopy was accomplished without  ?                          difficulty. The patient tolerated the procedure  ?                          well. ?Scope In: 1:50:09 PM ?  Scope Out: 1:53:18 PM ?Total Procedure Duration: 0 hours 3 minutes 9 seconds  ?Findings: ?     There is no endoscopic evidence of bleeding, areas of erosion,  ?     esophagitis, ulcerations or varices in the entire esophagus. ?     The Z-line was regular and was found 36 cm from the incisors. ?     A single 10 mm submucosal papule (nodule) with no bleeding and no  ?      stigmata of recent bleeding was found in the gastric antrum. Biopsies  ?     were taken with a cold forceps for histology. ?     The duodenal bulb, first portion of the duodenum and second portion of  ?     the duodenum were normal. ?Impression:               - Z-line regular, 36 cm from the incisors. ?                          - A single submucosal papule (nodule) found in the  ?                          stomach. Biopsied. ?                          - Normal duodenal bulb, first portion of the  ?                          duodenum and second portion of the duodenum. ?Moderate Sedation: ?     Per Anesthesia Care ?Recommendation:           - Patient has a contact number available for  ?                          emergencies. The signs and symptoms of potential  ?                          delayed complications were discussed with the  ?                          patient. Return to normal activities tomorrow.  ?                          Written discharge instructions were provided to the  ?                          patient. ?                          - Resume previous diet. ?                          - Continue present medications. ?                          - Await pathology results. ?                          -  Esophageal dilation not performed today as  ?                          patient's dysphagia has resolved, with no obvious  ?                          stricture on today's exam. ?                          - Will discuss case with advanced endoscopy in  ?                          regards to possible EUS of her gastric nodule once  ?                          I have received her pathology report. ?Procedure Code(s):        --- Professional --- ?                          [phone], Esophagogastroduodenoscopy, flexible,  ?                          transoral; with biopsy, single or multiple ?Diagnosis Code(s):        --- Professional --- ?                          K31.89, Other diseases of stomach and duodenum ?                           R10.13, Epigastric pain ?                          R13.10, Dysphagia, unspecified ?CPT copyright 2019 [employer] Medical Association. All rights reserved. ?The codes documented in this report are preliminary and upon coder review may  ?be revised to meet current compliance requirements. ?Elon Alas. Abbey Chatters, DO ?Elon Alas. [place], DO ?01/14/2022 1:58:42 PM ?This report has been signed electronically. ?Number of Addenda: 0 ?

## 2022-01-14 NOTE — Anesthesia Postprocedure Evaluation (Signed)
Anesthesia Post Note ? ?Patient: Kaylee Montgomery ? ?Procedure(s) Performed: ESOPHAGOGASTRODUODENOSCOPY (EGD) WITH PROPOFOL ?BALLOON DILATION ?BIOPSY ? ?Patient location during evaluation: Phase II ?Anesthesia Type: General ?Level of consciousness: awake ?Pain management: pain level controlled ?Vital Signs Assessment: post-procedure vital signs reviewed and stable ?Respiratory status: spontaneous breathing and respiratory function stable ?Cardiovascular status: blood pressure returned to baseline and stable ?Postop Assessment: no headache and no apparent nausea or vomiting ?Anesthetic complications: no ?Comments: Late entry ? ? ?No notable events documented. ? ? ?Last Vitals:  ?Vitals:  ? 01/14/22 1309 01/14/22 1357  ?BP: 112/77 137/82  ?Pulse: 84 96  ?Resp: 18 20  ?Temp: 36.9 ?C 36.6 ?C  ?SpO2: 98% 98%  ?  ?Last Pain:  ?Vitals:  ? 01/14/22 1357  ?TempSrc: Oral  ?PainSc: 0-No pain  ? ? ?  ?  ?  ?  ?  ?  ? ?Louann Sjogren ? ? ? ? ?

## 2022-01-16 LAB — SURGICAL PATHOLOGY

## 2022-01-17 ENCOUNTER — Encounter (HOSPITAL_COMMUNITY): Payer: Self-pay | Admitting: Internal Medicine

## 2022-01-21 ENCOUNTER — Other Ambulatory Visit: Payer: Self-pay | Admitting: Family Medicine

## 2022-01-29 ENCOUNTER — Encounter (HOSPITAL_COMMUNITY): Payer: Self-pay

## 2022-01-29 ENCOUNTER — Ambulatory Visit: Payer: BC Managed Care – PPO | Admitting: Family Medicine

## 2022-02-25 ENCOUNTER — Encounter: Payer: Self-pay | Admitting: Family Medicine

## 2022-02-25 ENCOUNTER — Ambulatory Visit: Payer: BC Managed Care – PPO | Admitting: Family Medicine

## 2022-02-25 VITALS — BP 120/70 | HR 97 | Ht 64.0 in | Wt 203.0 lb

## 2022-02-25 DIAGNOSIS — Z794 Long term (current) use of insulin: Secondary | ICD-10-CM

## 2022-02-25 DIAGNOSIS — I10 Essential (primary) hypertension: Secondary | ICD-10-CM | POA: Diagnosis not present

## 2022-02-25 DIAGNOSIS — E782 Mixed hyperlipidemia: Secondary | ICD-10-CM

## 2022-02-25 DIAGNOSIS — N62 Hypertrophy of breast: Secondary | ICD-10-CM

## 2022-02-25 DIAGNOSIS — E1159 Type 2 diabetes mellitus with other circulatory complications: Secondary | ICD-10-CM | POA: Diagnosis not present

## 2022-02-25 DIAGNOSIS — E1165 Type 2 diabetes mellitus with hyperglycemia: Secondary | ICD-10-CM

## 2022-02-25 DIAGNOSIS — E669 Obesity, unspecified: Secondary | ICD-10-CM

## 2022-02-25 MED ORDER — METOPROLOL TARTRATE 75 MG PO TABS: ORAL_TABLET | ORAL | 3 refills | Status: DC

## 2022-02-25 MED ORDER — AMLODIPINE-ATORVASTATIN 10-20 MG PO TABS: 1.0000 | ORAL_TABLET | Freq: Every day | ORAL | 3 refills | Status: DC

## 2022-02-25 NOTE — Assessment & Plan Note (Signed)
?  Patient re-educated about  the importance of commitment to a  minimum of 150 minutes of exercise per week as able. ? ?The importance of healthy food choices with portion control discussed, as well as eating regularly and within a 12 hour window most days. ?The need to choose "clean , green" food 50 to 75% of the time is discussed, as well as to make water the primary drink and set a goal of 64 ounces water daily. ? ?  ? ?  02/25/2022  ?  2:41 PM 01/14/2022  ?  1:09 PM 01/08/2022  ?  2:03 PM  ?Weight /BMI  ?Weight 203 lb 0.6 oz 201 lb 3.2 oz 201 lb 3.2 oz  ?Height '5\' 4"'$  (1.626 m) '5\' 4"'$  (1.626 m) '5\' 4"'$  (1.626 m)  ?BMI 34.85 kg/m2 34.54 kg/m2 34.54 kg/m2  ? ? ? ?

## 2022-02-25 NOTE — Assessment & Plan Note (Signed)
Hyperlipidemia:Low fat diet discussed and encouraged. ? ? ?Lipid Panel  ?Lab Results  ?Component Value Date  ? CHOL 177 01/10/2022  ? HDL 30 (L) 01/10/2022  ? LDLCALC 125 (H) 01/10/2022  ? TRIG 121 01/10/2022  ? CHOLHDL 5.9 (H) 01/10/2022  ?uncontrolled  ?Updated lab needed at/ before next visit. ? ? ? ? ?

## 2022-02-25 NOTE — Patient Instructions (Addendum)
F/U in 4 months, call if you need me before ? ?NEW for blood pressure and cholesterol is caduet which is the same dose of amlodipine 10 mg and atorvastatin 20 m g combined  in 1 pill ? ? ?Fasting lipid, cmp and EGFR, in 4 months, draw blood 3 to 5 days before visit ? ?You are referred to plastics as requested ? ?Microalb today ? ? ?It is important that you exercise regularly at least 30 minutes 5 times a week. If you develop chest pain, have severe difficulty breathing, or feel very tired, stop exercising immediately and seek medical attention  ? ? ?Thanks for choosing [facility], we consider it a privelige to serve you. ? ? ?

## 2022-02-25 NOTE — Assessment & Plan Note (Signed)
Cup size G with upper back and shoulder pain refer to plastics for breast reduction ?

## 2022-02-25 NOTE — Progress Notes (Signed)
? ?Kaylee Montgomery     MRN: [id number]      DOB: 04/17/1981 ? ? ?HPI ?Kaylee Montgomery is here for follow up and re-evaluation of chronic medical conditions, medication management and review of any available recent lab and radiology data.  ?Preventive health is updated, specifically  Cancer screening and Immunization.   ?Questions or concerns regarding consultations or procedures which the PT has had in the interim are  addressed. ?The PT denies any adverse reactions to current medications since the last visit.  ?Wants eval for rept breast reduction surgery cup is size G and c/o neck, upper back and shoulder pain with indentation of shoulder skin, recurrent rashes ? ?ROS ?Denies recent fever or chills. ?Denies sinus pressure, nasal congestion, ear pain or sore throat. ?Denies chest congestion, productive cough or wheezing. ?Denies chest pains, palpitations and leg swelling ?Denies abdominal pain, nausea, vomiting,diarrhea or constipation.   ?Denies dysuria, frequency, hesitancy or incontinence. ? ?Denies headaches, seizures, numbness, or tingling. ?Denies depression, anxiety or insomnia. ?Denies skin break down or rash. ? ? ?PE ? ?BP 114/79   Pulse 97   Ht '5\' 4"'$  (1.626 m)   Wt 203 lb 0.6 oz (92.1 kg)   SpO2 96%   BMI 34.85 kg/m?  ? ?Patient alert and oriented and in no cardiopulmonary distress. ? ?HEENT: No facial asymmetry, EOMI,     Neck decreased ROM . ? ?Chest: Clear to auscultation bilaterally. ? ?CVS: S1, S2 no murmurs, no S3.Regular rate. ? ?ABD: Soft non tender.  ? ?Ext: No edema ? ?MS: Adequate ROM spine, shoulders, hips and knees. ? ?Skin: Intact, no ulcerations or rash noted. ? ?Psych: Good eye contact, normal affect. Memory intact not anxious or depressed appearing. ? ?CNS: CN 2-12 intact, power,  normal throughout.no focal deficits noted. ? ? ?Assessment & Plan ? ?Essential hypertension, benign ?Controlled ?DASH diet and commitment to daily physical activity for a minimum of 30 minutes discussed and  encouraged, as a part of hypertension management. ?The importance of attaining a healthy weight is also discussed. ? ? ?  02/25/2022  ?  3:39 PM 02/25/2022  ?  2:41 PM 01/14/2022  ?  1:57 PM 01/14/2022  ?  1:09 PM 01/08/2022  ?  2:03 PM 11/21/2021  ?  2:10 PM 11/05/2021  ? 11:45 AM  ?BP/Weight  ?Systolic BP 433 295 188 416 118 113 114  ?Diastolic BP 70 79 82 77 74 78 76  ?Wt. (Lbs)  203.04  201.2 201.2 205 203.56  ?BMI  34.85 kg/m2  34.54 kg/m2 34.54 kg/m2 35.19 kg/m2 34.94 kg/m2  ? ? ? ? ? ?Controlled type 2 diabetes mellitus with hyperglycemia ([place]) ?Kaylee Montgomery is reminded of the importance of commitment to daily physical activity for 30 minutes or more, as able and the need to limit carbohydrate intake to 30 to 60 grams per meal to help with blood sugar control.  ? ?The need to take medication as prescribed, test blood sugar as directed, and to call between visits if there is a concern that blood sugar is uncontrolled is also discussed.  ? ?Kaylee Montgomery is reminded of the importance of daily foot exam, annual eye examination, and good blood sugar, blood pressure and cholesterol control. ?Uncontrolled, managed by Endo ? ? ?  Latest Ref Rng & Units 01/10/2022  ? 11:45 AM 10/29/2021  ? 12:24 PM 05/20/2021  ?  9:30 AM 11/12/2020  ? 11:38 AM 07/06/2020  ? 10:00 AM  ?Diabetic Labs  ?HbA1c 4.8 - 5.6 %  7.9     ?Microalbumin Not Estab. ug/mL     19.9    ?Micro/Creat Ratio 0 - 29 mg/g creat     38    ?Chol 100 - 199 mg/dL 177     121     ?HDL >39 mg/dL 30     40     ?Calc LDL 0 - 99 mg/dL 125     60     ?Triglycerides 0 - 149 mg/dL 121     114     ?Creatinine 0.57 - 1.00 mg/dL 0.68   0.65   0.61   0.60     ? ? ?  02/25/2022  ?  3:39 PM 02/25/2022  ?  2:41 PM 01/14/2022  ?  1:57 PM 01/14/2022  ?  1:09 PM 01/08/2022  ?  2:03 PM 11/21/2021  ?  2:10 PM 11/05/2021  ? 11:45 AM  ?BP/Weight  ?Systolic BP 174 944 967 591 118 113 114  ?Diastolic BP 70 79 82 77 74 78 76  ?Wt. (Lbs)  203.04  201.2 201.2 205 203.56  ?BMI  34.85 kg/m2  34.54 kg/m2 34.54 kg/m2  35.19 kg/m2 34.94 kg/m2  ? ? ?  07/05/2020  ?  1:00 PM 05/25/2019  ? 11:00 AM  ?Foot/eye exam completion dates  ?Foot Form Completion Done Done  ? ? ? ? ? ? ?Mixed hyperlipidemia ?Hyperlipidemia:Low fat diet discussed and encouraged. ? ? ?Lipid Panel  ?Lab Results  ?Component Value Date  ? CHOL 177 01/10/2022  ? HDL 30 (L) 01/10/2022  ? LDLCALC 125 (H) 01/10/2022  ? TRIG 121 01/10/2022  ? CHOLHDL 5.9 (H) 01/10/2022  ?uncontrolled  ?Updated lab needed at/ before next visit. ? ? ? ? ? ?GYNECOMASTIA ?Cup size G with upper back and shoulder pain refer to plastics for breast reduction ? ?Obesity (BMI 30.0-34.9) ? ?Patient re-educated about  the importance of commitment to a  minimum of 150 minutes of exercise per week as able. ? ?The importance of healthy food choices with portion control discussed, as well as eating regularly and within a 12 hour window most days. ?The need to choose "clean , green" food 50 to 75% of the time is discussed, as well as to make water the primary drink and set a goal of 64 ounces water daily. ? ?  ? ?  02/25/2022  ?  2:41 PM 01/14/2022  ?  1:09 PM 01/08/2022  ?  2:03 PM  ?Weight /BMI  ?Weight 203 lb 0.6 oz 201 lb 3.2 oz 201 lb 3.2 oz  ?Height '5\' 4"'$  (1.626 m) '5\' 4"'$  (1.626 m) '5\' 4"'$  (1.626 m)  ?BMI 34.85 kg/m2 34.54 kg/m2 34.54 kg/m2  ? ? ? ? ?

## 2022-02-25 NOTE — Assessment & Plan Note (Signed)
[name] is reminded of the importance of commitment to daily physical activity for 30 minutes or more, as able and the need to limit carbohydrate intake to 30 to 60 grams per meal to help with blood sugar control.  ? ?The need to take medication as prescribed, test blood sugar as directed, and to call between visits if there is a concern that blood sugar is uncontrolled is also discussed.  ? ?[name] is reminded of the importance of daily foot exam, annual eye examination, and good blood sugar, blood pressure and cholesterol control. ?Uncontrolled, managed by Endo ? ? ?  Latest Ref Rng & Units 01/10/2022  ? 11:45 AM 10/29/2021  ? 12:24 PM 05/20/2021  ?  9:30 AM 11/12/2020  ? 11:38 AM 07/06/2020  ? 10:00 AM  ?Diabetic Labs  ?HbA1c 4.8 - 5.6 %    7.9     ?Microalbumin Not Estab. ug/mL     19.9    ?Micro/Creat Ratio 0 - 29 mg/g creat     38    ?Chol 100 - 199 mg/dL 177     121     ?HDL >39 mg/dL 30     40     ?Calc LDL 0 - 99 mg/dL 125     60     ?Triglycerides 0 - 149 mg/dL 121     114     ?Creatinine 0.57 - 1.00 mg/dL 0.68   0.65   0.61   0.60     ? ? ?  02/25/2022  ?  3:39 PM 02/25/2022  ?  2:41 PM 01/14/2022  ?  1:57 PM 01/14/2022  ?  1:09 PM 01/08/2022  ?  2:03 PM 11/21/2021  ?  2:10 PM 11/05/2021  ? 11:45 AM  ?BP/Weight  ?Systolic BP 967 591 638 466 118 113 114  ?Diastolic BP 70 79 82 77 74 78 76  ?Wt. (Lbs)  203.04  201.2 201.2 205 203.56  ?BMI  34.85 kg/m2  34.54 kg/m2 34.54 kg/m2 35.19 kg/m2 34.94 kg/m2  ? ? ?  07/05/2020  ?  1:00 PM 05/25/2019  ? 11:00 AM  ?Foot/eye exam completion dates  ?Foot Form Completion Done Done  ? ? ? ? ? ?

## 2022-02-25 NOTE — Assessment & Plan Note (Signed)
Controlled ?DASH diet and commitment to daily physical activity for a minimum of 30 minutes discussed and encouraged, as a part of hypertension management. ?The importance of attaining a healthy weight is also discussed. ? ? ?  02/25/2022  ?  3:39 PM 02/25/2022  ?  2:41 PM 01/14/2022  ?  1:57 PM 01/14/2022  ?  1:09 PM 01/08/2022  ?  2:03 PM 11/21/2021  ?  2:10 PM 11/05/2021  ? 11:45 AM  ?BP/Weight  ?Systolic BP 063 016 010 932 118 113 114  ?Diastolic BP 70 79 82 77 74 78 76  ?Wt. (Lbs)  203.04  201.2 201.2 205 203.56  ?BMI  34.85 kg/m2  34.54 kg/m2 34.54 kg/m2 35.19 kg/m2 34.94 kg/m2  ? ? ? ? ?

## 2022-02-27 ENCOUNTER — Other Ambulatory Visit: Payer: Self-pay | Admitting: Family Medicine

## 2022-02-27 LAB — MICROALBUMIN / CREATININE URINE RATIO
Creatinine, Urine: 42.1 mg/dL
Microalb/Creat Ratio: 12 mg/g creat (ref 0–29)
Microalbumin, Urine: 5.1 ug/mL

## 2022-03-06 ENCOUNTER — Other Ambulatory Visit: Payer: Self-pay | Admitting: Family Medicine

## 2022-03-06 ENCOUNTER — Telehealth: Payer: Self-pay

## 2022-03-06 DIAGNOSIS — Z8719 Personal history of other diseases of the digestive system: Secondary | ICD-10-CM

## 2022-03-06 DIAGNOSIS — K3189 Other diseases of stomach and duodenum: Secondary | ICD-10-CM

## 2022-03-06 MED ORDER — ATORVASTATIN CALCIUM 20 MG PO TABS: 20.0000 mg | ORAL_TABLET | Freq: Every day | ORAL | 3 refills | Status: AC

## 2022-03-06 MED ORDER — AMLODIPINE BESYLATE 10 MG PO TABS: 10.0000 mg | ORAL_TABLET | Freq: Every day | ORAL | 3 refills | Status: DC

## 2022-03-06 NOTE — Telephone Encounter (Signed)
PA for CT abd/pelvis w/contrast scheduled for 03/19/22 at 11:30am, arrive at 11:15am. Pick up contrast prior to appt and have pregnancy test done. NPO 4 hours prior to test. CT scheduled at [facility] due to [facility] booked till end of June.   Called pt, informed her of CT appt and details. Appt letter sent to MyChart and mailed.  PA for CT abd/pelvis w/contrast submitted via [employer]. Order ID: [id number], valid 03/06/22-04/04/22.

## 2022-03-06 NOTE — Telephone Encounter (Signed)
-----   Message from Eloise Harman, DO sent at 03/06/2022  3:49 PM EDT -----  ----- Message ----- From: Eloise Harman, DO Sent: 03/06/2022   2:08 PM EDT To: Corky Downs, wanted to send you an update on this patient.  EGD showed a nodule in her gastric antrum.  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  They recommended reaching out to Dr Delrae Alfred at [facility].    I was finally able to talk to him last week.  He looked at old images from 2018 and states this is not where the stent was placed and this lesion is likely not related at all.  I then again reached back out to [facility] and they recommended CT imaging first with possible EUS depending on findings.  Given her history of pancreatitis I think it would be a good idea to update her CT scan anyway.  Mindy/Bettina Warn can we set her up for ct abd pelvis w contrast?  I have called and spoke with patient.  She is agreeable.  Thank you

## 2022-03-06 NOTE — Telephone Encounter (Signed)
Patient aware.

## 2022-03-06 NOTE — Telephone Encounter (Signed)
Patient called said [place] the cholesterol and blood pressure combined can not afford it with her insurance it cost $90.00. Patient asking can she go back to not combined these its cheaper. Please return patient call at [phone].

## 2022-03-18 ENCOUNTER — Other Ambulatory Visit (HOSPITAL_COMMUNITY)
Admission: RE | Admit: 2022-03-18 | Discharge: 2022-03-18 | Disposition: A | Discharge status: Home / Self Care | Payer: BC Managed Care – PPO | Source: Ambulatory Visit | Attending: Internal Medicine | Admitting: Internal Medicine

## 2022-03-18 ENCOUNTER — Encounter: Payer: Self-pay | Admitting: Family Medicine

## 2022-03-18 DIAGNOSIS — Z3202 Encounter for pregnancy test, result negative: Secondary | ICD-10-CM | POA: Diagnosis not present

## 2022-03-18 DIAGNOSIS — Z8719 Personal history of other diseases of the digestive system: Secondary | ICD-10-CM | POA: Diagnosis not present

## 2022-03-18 DIAGNOSIS — K3189 Other diseases of stomach and duodenum: Secondary | ICD-10-CM | POA: Insufficient documentation

## 2022-03-18 LAB — PREGNANCY, URINE: Preg Test, Ur: NEGATIVE

## 2022-03-18 NOTE — Telephone Encounter (Signed)
Office visit scheduled 03/20/22 @ 10:20

## 2022-03-19 ENCOUNTER — Ambulatory Visit (HOSPITAL_BASED_OUTPATIENT_CLINIC_OR_DEPARTMENT_OTHER)
Admission: RE | Admit: 2022-03-19 | Discharge: 2022-03-19 | Disposition: A | Discharge status: Home / Self Care | Payer: BC Managed Care – PPO | Source: Ambulatory Visit | Attending: Internal Medicine | Admitting: Internal Medicine

## 2022-03-19 ENCOUNTER — Encounter (HOSPITAL_BASED_OUTPATIENT_CLINIC_OR_DEPARTMENT_OTHER): Payer: Self-pay

## 2022-03-19 DIAGNOSIS — K3189 Other diseases of stomach and duodenum: Secondary | ICD-10-CM | POA: Diagnosis present

## 2022-03-19 DIAGNOSIS — Z8719 Personal history of other diseases of the digestive system: Secondary | ICD-10-CM | POA: Insufficient documentation

## 2022-03-19 LAB — POCT I-STAT CREATININE: Creatinine, Ser: 0.6 mg/dL (ref 0.44–1.00)

## 2022-03-19 MED ORDER — IOHEXOL 300 MG/ML  SOLN
100.0000 mL | Freq: Once | INTRAMUSCULAR | Status: AC | PRN
  Administered 2022-03-19: 75 mL via INTRAVENOUS

## 2022-03-20 ENCOUNTER — Encounter: Payer: Self-pay | Admitting: Family Medicine

## 2022-03-20 ENCOUNTER — Ambulatory Visit: Payer: BC Managed Care – PPO | Admitting: Family Medicine

## 2022-03-20 VITALS — BP 114/81 | HR 80 | Resp 16 | Ht 64.0 in | Wt 201.0 lb

## 2022-03-20 DIAGNOSIS — Z794 Long term (current) use of insulin: Secondary | ICD-10-CM | POA: Diagnosis not present

## 2022-03-20 DIAGNOSIS — I10 Essential (primary) hypertension: Secondary | ICD-10-CM | POA: Diagnosis not present

## 2022-03-20 DIAGNOSIS — E1165 Type 2 diabetes mellitus with hyperglycemia: Secondary | ICD-10-CM

## 2022-03-20 DIAGNOSIS — N6452 Nipple discharge: Secondary | ICD-10-CM

## 2022-03-20 NOTE — Progress Notes (Signed)
Kaylee Montgomery     MRN: [id number]      DOB: [birth year]   HPI Kaylee Montgomery is here fwit a 2 week h/o painless bloody right nipple discharge. Noc/o breast mass, no recent trauma to breast. No personal or family h/o breast cancer No other concerns  ROS Denies recent fever or chills. Denies sinus pressure, nasal congestion, ear pain or sore throat. Denies chest congestion, productive cough or wheezing.   PE  BP 114/81   Pulse 80   Resp 16   Ht '5\' 4"'$  (1.626 m)   Wt 201 lb (91.2 kg)   SpO2 94%   BMI 34.50 kg/m   Patient alert and oriented and in no cardiopulmonary distress.  HEENT: No facial asymmetry, EOMI,     Neck supple .  Chest: Clear to auscultation bilaterally.   Breast: guaiac positive right nipple discharge from 3 o clock , no palpable masses, no axillary or supraclavicular adenopathy   CVS: S1, S2 no murmurs, no S3.Regular rate.  Ext: No edema  MS: Adequate ROM spine, shoulders, hips and knees.  Skin: Intact, no ulcerations or rash noted.  Psych: Good eye contact, normal affect. Memory intact not anxious or depressed appearing.  CNS: CN 2-12 intact, power,  normal throughout.no focal deficits noted.   Assessment & Plan  Bloody discharge from right nipple 2 week history, first episdoe, no personal or family h/o breast cancer. Urgent diagnostic imaging  Essential hypertension, benign Controlled, no change in medication   Controlled type 2 diabetes mellitus with hyperglycemia ([place]) Kaylee Montgomery is reminded of the importance of commitment to daily physical activity for 30 minutes or more, as able and the need to limit carbohydrate intake to 30 to 60 grams per meal to help with blood sugar control.  Uncontrolled, followed by Endo, has upcoming appoinment The need to take medication as prescribed, test blood sugar as directed, and to call between visits if there is a concern that blood sugar is uncontrolled is also discussed.   Kaylee Montgomery is reminded of  the importance of daily foot exam, annual eye examination, and good blood sugar, blood pressure and cholesterol control.     Latest Ref Rng & Units 03/19/2022   11:45 AM 02/25/2022    4:29 PM 01/10/2022   11:45 AM 10/29/2021   12:24 PM 05/20/2021    9:30 AM  Diabetic Labs  Micro/Creat Ratio 0 - 29 mg/g creat  12       Chol 100 - 199 mg/dL   177      HDL >39 mg/dL   30      Calc LDL 0 - 99 mg/dL   125      Triglycerides 0 - 149 mg/dL   121      Creatinine 0.44 - 1.00 mg/dL 0.60    0.68   0.65   0.61        03/20/2022   10:23 AM 02/25/2022    3:39 PM 02/25/2022    2:41 PM 01/14/2022    1:57 PM 01/14/2022    1:09 PM 01/08/2022    2:03 PM 11/21/2021    2:10 PM  BP/Weight  Systolic BP 295 621 308 657 846 962 952  Diastolic BP 81 70 79 82 77 74 78  Wt. (Lbs) 201  203.04  201.2 201.2 205  BMI 34.5 kg/m2  34.85 kg/m2  34.54 kg/m2 34.54 kg/m2 35.19 kg/m2      07/05/2020    1:00 PM 05/25/2019   11:00  AM  Foot/eye exam completion dates  Foot Form Completion Done Done

## 2022-03-20 NOTE — Patient Instructions (Addendum)
Keep follow up as before , call if you need me sooner  You are referred for urgent diagnostic right mammogram and t your regular routine mammogram will also be scheduled  Thanks for choosing [facility], we consider it a privelige to serve you.

## 2022-03-20 NOTE — Assessment & Plan Note (Signed)
2 week history, first episdoe, no personal or family h/o breast cancer. Urgent diagnostic imaging

## 2022-03-20 NOTE — Assessment & Plan Note (Addendum)
Kaylee Montgomery is reminded of the importance of commitment to daily physical activity for 30 minutes or more, as able and the need to limit carbohydrate intake to 30 to 60 grams per meal to help with blood sugar control.  Uncontrolled, followed by Endo, has upcoming appoinment The need to take medication as prescribed, test blood sugar as directed, and to call between visits if there is a concern that blood sugar is uncontrolled is also discussed.   Kaylee Montgomery is reminded of the importance of daily foot exam, annual eye examination, and good blood sugar, blood pressure and cholesterol control.     Latest Ref Rng & Units 03/19/2022   11:45 AM 02/25/2022    4:29 PM 01/10/2022   11:45 AM 10/29/2021   12:24 PM 05/20/2021    9:30 AM  Diabetic Labs  Micro/Creat Ratio 0 - 29 mg/g creat  12       Chol 100 - 199 mg/dL   177      HDL >39 mg/dL   30      Calc LDL 0 - 99 mg/dL   125      Triglycerides 0 - 149 mg/dL   121      Creatinine 0.44 - 1.00 mg/dL 0.60    0.68   0.65   0.61        03/20/2022   10:23 AM 02/25/2022    3:39 PM 02/25/2022    2:41 PM 01/14/2022    1:57 PM 01/14/2022    1:09 PM 01/08/2022    2:03 PM 11/21/2021    2:10 PM  BP/Weight  Systolic BP 109 323 557 322 025 427 062  Diastolic BP 81 70 79 82 77 74 78  Wt. (Lbs) 201  203.04  201.2 201.2 205  BMI 34.5 kg/m2  34.85 kg/m2  34.54 kg/m2 34.54 kg/m2 35.19 kg/m2      07/05/2020    1:00 PM 05/25/2019   11:00 AM  Foot/eye exam completion dates  Foot Form Completion Done Done

## 2022-03-20 NOTE — Assessment & Plan Note (Signed)
Controlled, no change in medication  

## 2022-03-25 ENCOUNTER — Ambulatory Visit (HOSPITAL_COMMUNITY)
Admission: RE | Admit: 2022-03-25 | Discharge: 2022-03-25 | Disposition: A | Discharge status: Home / Self Care | Payer: BC Managed Care – PPO | Source: Ambulatory Visit | Attending: Family Medicine | Admitting: Family Medicine

## 2022-03-25 DIAGNOSIS — N6452 Nipple discharge: Secondary | ICD-10-CM | POA: Insufficient documentation

## 2022-03-26 ENCOUNTER — Other Ambulatory Visit (HOSPITAL_COMMUNITY): Payer: Self-pay | Admitting: Family Medicine

## 2022-03-26 ENCOUNTER — Other Ambulatory Visit: Payer: Self-pay | Admitting: Family Medicine

## 2022-03-26 DIAGNOSIS — N6310 Unspecified lump in the right breast, unspecified quadrant: Secondary | ICD-10-CM

## 2022-03-26 DIAGNOSIS — R921 Mammographic calcification found on diagnostic imaging of breast: Secondary | ICD-10-CM

## 2022-03-26 DIAGNOSIS — R928 Other abnormal and inconclusive findings on diagnostic imaging of breast: Secondary | ICD-10-CM

## 2022-04-09 ENCOUNTER — Inpatient Hospital Stay: Payer: BC Managed Care – PPO | Attending: Hematology and Oncology | Admitting: Hematology and Oncology

## 2022-04-09 NOTE — Progress Notes (Deleted)
[place] NOTE  Patient Care Team: [name], MD as PCP - General [facility], Marga Melnick, MD (Inactive) as Consulting Physician (Gastroenterology) Lavonna Monarch, MD as Consulting Physician (Dermatology)  CHIEF COMPLAINTS/PURPOSE OF CONSULTATION:   High risk for breast cancer.  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  Thank you for consulting [place] the care of this patient.  Please not hesitate to contact us with any additional questions or concerns.  HISTORY OF PRESENTING ILLNESS:  Kaylee Montgomery 41 y.o. female is here because of ADH  Chronology  Mammogram April 2022 which showed possible mass and 2 groups of calcifications in the left breast. Further diagnostic mammo showed multiple groups of suspicious calcifications in the lateral to lower left breast spanning approximately 8 cm.  1 of these groups of calcifications is associated with the mass which does not have a sonographic correlate.  No evidence of left axillary lymphadenopathy. Biopsy done in May 2022 from the left lower outer quadrant showed atypical lobular hyperplasia second biopsy from the left lower outer mid showed atypical ductal hyperplasia. She underwent left breast lumpectomy with radioactive seed localization x2 on August 1 by Dr. Marlou Starks This showed focal atypical ductal hyperplasia from the left inferior lateral breast.  Left superior lateral breast excision showed focal atypical lobular hyperplasia.  There is intraductal papilloma with calcifications noted in the left deep margin of the superior lateral breast.  No evidence of malignancy. She is referred to high risk breast clinic given her atypical ductal hyperplasia and for further recommendations.  Age at menarche :70 First child birth at birth:15 Use of OCP: 15 yrs No breast  cancer in the family No ovarian cancer in the family.  She has DM, HTN at baseline, well controlled according to the patient.  Interval History  She is here for a follow up about using tamoxifen for endocrine prevention.  Rest of the pertinent 10 point ROS reviewed and negative.  MEDICAL HISTORY:  Past Medical History:  Diagnosis Date   Breast mass 02/2017   Dental crown present    Diabetes mellitus without complication ([place])    Hypertension    states BP fluctuates; has been on med. x 1 yr.   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    SURGICAL HISTORY: Past Surgical History:  Procedure Laterality Date   BALLOON DILATION N/A 01/14/2022   Procedure: BALLOON DILATION;  Surgeon: Eloise Harman, DO;  [facility];  Service: Endoscopy;  Laterality: N/A;   BIOPSY  01/14/2022   Procedure: BIOPSY;  Surgeon: Eloise Harman, DO;  [facility];  Service: Endoscopy;;   BREAST EXCISIONAL BIOPSY Left 2018   Negative   BREAST LUMPECTOMY WITH RADIOACTIVE SEED LOCALIZATION Left 03/05/2017   Procedure: LEFT BREAST LUMPECTOMY WITH RADIOACTIVE SEED LOCALIZATION;  Surgeon: Donnie Mesa, MD;  Location: [place];  Service: General;  Laterality: Left;   BREAST LUMPECTOMY WITH RADIOACTIVE SEED LOCALIZATION Left 05/20/2021   Procedure: LEFT BREAST LUMPECTOMY WITH RADIOACTIVE SEED LOCALIZATION X2;  Surgeon: Jovita Kussmaul, MD;  Location: [place];  Service: General;  Laterality: Left;   [place]  08/2017   [facility]   ESOPHAGOGASTRODUODENOSCOPY  07/21/2017   [facility]: Dr. Delrae Alfred. healthy appearing cyst cavity without necrotic material that was decreased in size. Removal of stent scheduled for 11/12.    ESOPHAGOGASTRODUODENOSCOPY (EGD) WITH PROPOFOL N/A 01/14/2022   Procedure:  ESOPHAGOGASTRODUODENOSCOPY (EGD) WITH PROPOFOL;  Surgeon: Eloise Harman, DO;  [facility];  Service: Endoscopy;  Laterality: N/A;  2:15 ASA 2.   EUS  06/19/2017   [facility]: 7 x 7.3 cm cyst with necrotic material noted adjacent to the body of the pancreas. FNA without malignancy.   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)   TONSILLECTOMY  age 42    SOCIAL HISTORY: Social History   Socioeconomic History   Marital status: Single    Spouse name: Not on file   Number of children: 1   Years of education: Not on file   Highest education level: Not on file  Occupational History   Occupation: STUDENT    Occupation: CASEWORKER    Employer: [employer]  Tobacco Use   Smoking status: Never   Smokeless tobacco: Never  Vaping Use   Vaping Use: Never used  Substance and Sexual Activity   Alcohol use: No    Alcohol/week: 0.0 standard drinks of alcohol    Comment: never drank regularly or heavily but NO etoh at all since 01/2017   Drug use: No   Sexual activity: Yes    Birth control/protection: None  Other Topics Concern   Not on file  Social History Narrative   Patient is right handed.   Patient drinks 1-2 cups of caffeine daily.   Social Determinants of Health   Financial Resource Strain: Not on file  Food Insecurity: Not on file  Transportation Needs: Not on file  Physical Activity: Not on file  Stress: Not on file  Social Connections: Not on file  Intimate Partner Violence: Not on file    FAMILY HISTORY: Family History  Problem Relation Age of Onset   Heart disease Father        stent   Migraines Father    Migraines Son    Diabetes Maternal Grandfather    Colon cancer Neg Hx      ALLERGIES:  is allergic to bromocriptine mesylate, depo-medrol [methylprednisolone sodium succ], dilaudid [hydromorphone hcl], and imitrex [sumatriptan].  MEDICATIONS:  Current Outpatient Medications  Medication Sig Dispense Refill   amLODipine (NORVASC) 10 MG tablet Take 1 tablet (10 mg total) by mouth daily. 90 tablet 3   atorvastatin (LIPITOR) 20 MG tablet Take 1 tablet (20 mg total) by mouth daily. 90 tablet 3   BD PEN NEEDLE NANO 2ND GEN 32G X 4 MM MISC Inject into the skin daily.     cyclobenzaprine (FLEXERIL) 10 MG tablet Take 1 tablet (10 mg total) by mouth at bedtime. 90 tablet 3   Empagliflozin-metFORMIN HCl ER (SYNJARDY XR) 12.02-999 MG TB24 Take 1 tablet by mouth 2 (two) times daily.     gabapentin (NEURONTIN) 300 MG capsule TAKE 1 CAPSULE(300 MG) BY MOUTH AT BEDTIME 90 capsule 3   glucose blood (ONETOUCH VERIO) test strip Use as instructed bid. E11.65 100 each 5   insulin degludec (TRESIBA) 100 UNIT/ML FlexTouch Pen Inject 10 Units into the skin at  bedtime.     Insulin Pen Needle (BD PEN NEEDLE NANO 2ND GEN) 32G X 4 MM MISC See admin instructions.     JANUVIA 100 MG tablet Take 100 mg by mouth daily.     Lancets MISC 1 each by Does not apply route 2 (two) times daily. 100 each 5   Metoprolol Tartrate 75 MG TABS Take one tablet by mouth once daily 90 tablet 3   norethindrone (MICRONOR) 0.35 MG tablet TAKE 1 TABLET(0.35 MG) BY MOUTH DAILY 28 tablet 5   Vitamin D, Ergocalciferol, (DRISDOL) 1.25 MG (50000 UNIT) CAPS capsule TAKE 1 CAPSULE BY MOUTH EVERY 7 DAYS 12 capsule 1   No current facility-administered medications for this visit.   PHYSICAL EXAMINATION: ECOG PERFORMANCE STATUS: 0 - Asymptomatic  There were no vitals taken for this visit.   Physical Exam Constitutional:      Appearance: Normal appearance.  HENT:     Head: Normocephalic and atraumatic.  Cardiovascular:     Rate and Rhythm: Normal rate and regular rhythm.     Pulses: Normal pulses.     Heart  sounds: Normal heart sounds.  Pulmonary:     Effort: Pulmonary effort is normal.     Breath sounds: Normal breath sounds.  Chest:     Comments: Bilateral large pendulous breasts.  No obvious masses or palpable regional adenopathy noted today. Abdominal:     General: Abdomen is flat. Bowel sounds are normal.     Palpations: Abdomen is soft.  Musculoskeletal:        General: No swelling or tenderness.     Cervical back: Normal range of motion and neck supple. No rigidity.  Lymphadenopathy:     Cervical: No cervical adenopathy.  Skin:    General: Skin is warm and dry.  Neurological:     General: No focal deficit present.     Mental Status: She is alert.  Psychiatric:        Mood and Affect: Mood normal.     LABORATORY DATA:  I have reviewed the data as listed Lab Results  Component Value Date   WBC 5.6 01/10/2022   HGB 13.2 01/10/2022   HCT 40.2 01/10/2022   MCV 76 (L) 01/10/2022   PLT 363 01/10/2022     Chemistry      Component Value Date/Time   NA 143 01/10/2022 1145   K 3.9 01/10/2022 1145   CL 106 01/10/2022 1145   CO2 21 01/10/2022 1145   BUN 6 01/10/2022 1145   CREATININE 0.60 03/19/2022 1145   CREATININE 0.56 03/11/2019 1013      Component Value Date/Time   CALCIUM 9.3 01/10/2022 1145   ALKPHOS 38 (L) 01/10/2022 1145   AST 15 01/10/2022 1145   ALT 12 01/10/2022 1145   BILITOT 0.7 01/10/2022 1145       RADIOGRAPHIC STUDIES: I have personally reviewed the radiological images as listed and agreed with the findings in the report. MM DIAG BREAST TOMO BILATERAL  Result Date: 03/25/2022 CLINICAL DATA:  41 year old female with history of left breast excision for atypia 2018 as well as left breast excisions 2 sites 05/20/2021 for atypical lobular hyperplasia as well as atypical ductal hyperplasia. Subsequent MRI guided biopsy of non mass/periductal enhancement 09/25/2021 in the lower outer right breast demonstrated pseudoangiomatous stromal hyperplasia. Patient  now presents with bloody right nipple discharge. EXAM: DIGITAL DIAGNOSTIC BILATERAL MAMMOGRAM WITH TOMOSYNTHESIS AND CAD TECHNIQUE: Bilateral digital diagnostic mammography and breast tomosynthesis was performed. The images were evaluated with  computer-aided detection. COMPARISON:  Previous exam(s). ACR Breast Density Category b: There are scattered areas of fibroglandular density. FINDINGS: No suspicious masses or calcifications are seen in the right breast. A dumbbell shaped biopsy marking clip is present in the upper-outer right breast at site of prior benign MRI biopsy. An initially questioned asymmetry seen in the right breast resolves on the additional imaging with findings compatible with an area of overlapping fibroglandular tissue. Postsurgical changes are present in the upper-outer left breast related to prior excisions. Spot compression magnification views of the upper-outer left breast were performed demonstrating linear oriented amorphous calcifications with the most posterior group of calcifications spanning 0.4 cm in the more anterior group of calcifications spanning 1.2 cm. These calcifications span approximately 7 cm. Targeted ultrasound of the retroareolar right breast was performed demonstrating multiple generalized dilated ducts most suggestive of ductal ectasia. There is an intraductal mass in the right breast at 6 o'clock retroareolar measuring 0.9 x 0.4 x 1 cm. No lymphadenopathy seen in the right axilla. IMPRESSION: 1. Indeterminate 1 cm intraductal mass in the right breast at 6 o'clock retroareolar. 2. Indeterminate calcifications in the upper-outer left breast spanning 7 cm. RECOMMENDATION: 1. Recommend ultrasound-guided core biopsy of the 1 cm intraductal mass in the right breast at 6 o'clock retroareolar. If biopsy results return as benign, then the patient's bloody nipple discharge may simply be related to ductal ectasia given findings on MRI dated 09/17/2021 (with benign MRI biopsy of  these ducts in the lower outer right breast demonstrating PASH). 2. Recommend stereotactic guided biopsy of the most posterior and anterior group of calcifications in the upper-outer posterior left breast. I have discussed the findings and recommendations with the patient. If applicable, a reminder letter will be sent to the patient regarding the next appointment. BI-RADS CATEGORY  4: Suspicious. Electronically Signed   By: Everlean Alstrom M.D.   On: 03/25/2022 12:00  US BREAST LTD UNI RIGHT INC AXILLA  Result Date: 03/25/2022 CLINICAL DATA:  41 year old female with history of left breast excision for atypia 2018 as well as left breast excisions 2 sites 05/20/2021 for atypical lobular hyperplasia as well as atypical ductal hyperplasia. Subsequent MRI guided biopsy of non mass/periductal enhancement 09/25/2021 in the lower outer right breast demonstrated pseudoangiomatous stromal hyperplasia. Patient now presents with bloody right nipple discharge. EXAM: DIGITAL DIAGNOSTIC BILATERAL MAMMOGRAM WITH TOMOSYNTHESIS AND CAD TECHNIQUE: Bilateral digital diagnostic mammography and breast tomosynthesis was performed. The images were evaluated with computer-aided detection. COMPARISON:  Previous exam(s). ACR Breast Density Category b: There are scattered areas of fibroglandular density. FINDINGS: No suspicious masses or calcifications are seen in the right breast. A dumbbell shaped biopsy marking clip is present in the upper-outer right breast at site of prior benign MRI biopsy. An initially questioned asymmetry seen in the right breast resolves on the additional imaging with findings compatible with an area of overlapping fibroglandular tissue. Postsurgical changes are present in the upper-outer left breast related to prior excisions. Spot compression magnification views of the upper-outer left breast were performed demonstrating linear oriented amorphous calcifications with the most posterior group of calcifications  spanning 0.4 cm in the more anterior group of calcifications spanning 1.2 cm. These calcifications span approximately 7 cm. Targeted ultrasound of the retroareolar right breast was performed demonstrating multiple generalized dilated ducts most suggestive of ductal ectasia. There is an intraductal mass in the right breast at 6 o'clock retroareolar measuring 0.9 x 0.4 x 1 cm. No lymphadenopathy seen in the right axilla. IMPRESSION: 1.  Indeterminate 1 cm intraductal mass in the right breast at 6 o'clock retroareolar. 2. Indeterminate calcifications in the upper-outer left breast spanning 7 cm. RECOMMENDATION: 1. Recommend ultrasound-guided core biopsy of the 1 cm intraductal mass in the right breast at 6 o'clock retroareolar. If biopsy results return as benign, then the patient's bloody nipple discharge may simply be related to ductal ectasia given findings on MRI dated 09/17/2021 (with benign MRI biopsy of these ducts in the lower outer right breast demonstrating PASH). 2. Recommend stereotactic guided biopsy of the most posterior and anterior group of calcifications in the upper-outer posterior left breast. I have discussed the findings and recommendations with the patient. If applicable, a reminder letter will be sent to the patient regarding the next appointment. BI-RADS CATEGORY  4: Suspicious. Electronically Signed   By: Everlean Alstrom M.D.   On: 03/25/2022 12:00  CT Abdomen Pelvis W Contrast  Result Date: 03/20/2022 CLINICAL DATA:  EGD showed nodules in the gastric antrum. EXAM: CT ABDOMEN AND PELVIS WITH CONTRAST TECHNIQUE: Multidetector CT imaging of the abdomen and pelvis was performed using the standard protocol following bolus administration of intravenous contrast. RADIATION DOSE REDUCTION: This exam was performed according to the departmental dose-optimization program which includes automated exposure control, adjustment of the mA and/or kV according to patient size and/or use of iterative  reconstruction technique. CONTRAST:  47m OMNIPAQUE IOHEXOL 300 MG/ML  SOLN COMPARISON:  CT abdomen and pelvis 06/24/2017. FINDINGS: Lower chest: No acute abnormality. Hepatobiliary: The liver is mildly enlarged. Gallbladder surgically absent. No focal liver lesions are identified. There is no biliary ductal dilatation. Pancreas: Previously identified fluid collection in the region of the body and tail the pancreas has significantly decreased in size now measuring 3.1 x 0.9 by 1.2 cm (previously 9 x 5 cm). There is no surrounding inflammation. There is surrounding pancreas is atrophied. The pancreatic head is normal. Spleen: Normal in size without focal abnormality. Adrenals/Urinary Tract: Adrenal glands are unremarkable. Kidneys are normal, without renal calculi, focal lesion, or hydronephrosis. Bladder is unremarkable. Stomach/Bowel: There are postsurgical changes in the stomach. No dilated bowel loops are seen. There is no focal wall thickening or inflammation identified. No dilated bowel loops. Appendix is within normal limits. Vascular/Lymphatic: Aorta is normal in size. No enlarged abdominal or pelvic lymph nodes. Collateral vessels are in the anterior abdomen, unchanged. 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 Reproductive: Uterus and bilateral adnexa are unremarkable. Other: No abdominal wall hernia or abnormality. No abdominopelvic ascites. Musculoskeletal: No acute or significant osseous findings. IMPRESSION: 1. No acute localizing process in the abdomen or pelvis. 2. Pancreatic fluid collection has significantly decreased in size now measuring 3.1 x 0.9 x 1.2 cm. No acute pancreatic inflammation. 3. Cholecystectomy. 4. Mild hepatomegaly. Electronically Signed   By: ARonney AstersM.D.   On: 03/20/2022 20:49     SURGICAL PATHOLOGY  CASE: [id number]  [facility]    Clinical History: Left breast atypical ductal hyperplasia (nt)   FINAL  MICROSCOPIC DIAGNOSIS:   A. BREAST, LEFT INFERIOR-LATERAL, EXCISION:  - Focal atypical ductal hyperplasia.  - Biopsy site.   B. BREAST, LEFT SUPERIOR-LATERAL, EXCISION:  - Focal atypical lobular hyperplasia.  - Biopsy site.   C. BREAST, LEFT DEEP MARGIN OF SUPERIOR-LATERAL, EXCISION:  - Intraductal papilloma with calcifications.  - Fibrocystic change and columnar cell hyperplasia.  - Biopsy site.   All questions were answered. The patient knows to call the clinic with any problems, questions or concerns.  Benay Pike, MD 04/09/2022 7:28 AM

## 2022-04-11 ENCOUNTER — Ambulatory Visit
Admission: RE | Admit: 2022-04-11 | Discharge: 2022-04-11 | Disposition: A | Discharge status: Home / Self Care | Payer: BC Managed Care – PPO | Source: Ambulatory Visit | Attending: Family Medicine | Admitting: Family Medicine

## 2022-04-11 DIAGNOSIS — N6310 Unspecified lump in the right breast, unspecified quadrant: Secondary | ICD-10-CM

## 2022-04-11 DIAGNOSIS — R928 Other abnormal and inconclusive findings on diagnostic imaging of breast: Secondary | ICD-10-CM

## 2022-04-11 DIAGNOSIS — N6452 Nipple discharge: Secondary | ICD-10-CM

## 2022-04-11 DIAGNOSIS — R921 Mammographic calcification found on diagnostic imaging of breast: Secondary | ICD-10-CM

## 2022-04-19 ENCOUNTER — Other Ambulatory Visit: Payer: Self-pay | Admitting: Family Medicine

## 2022-04-22 ENCOUNTER — Other Ambulatory Visit: Payer: Self-pay | Admitting: Family Medicine

## 2022-04-28 ENCOUNTER — Other Ambulatory Visit: Payer: Self-pay | Admitting: Family Medicine

## 2022-05-20 ENCOUNTER — Ambulatory Visit: Payer: Self-pay | Admitting: General Surgery

## 2022-05-20 DIAGNOSIS — D242 Benign neoplasm of left breast: Secondary | ICD-10-CM

## 2022-05-20 DIAGNOSIS — D241 Benign neoplasm of right breast: Secondary | ICD-10-CM

## 2022-05-21 ENCOUNTER — Other Ambulatory Visit: Payer: Self-pay | Admitting: General Surgery

## 2022-05-21 DIAGNOSIS — D242 Benign neoplasm of left breast: Secondary | ICD-10-CM

## 2022-05-21 DIAGNOSIS — D241 Benign neoplasm of right breast: Secondary | ICD-10-CM

## 2022-05-26 ENCOUNTER — Encounter: Payer: Self-pay | Admitting: Family Medicine

## 2022-05-26 ENCOUNTER — Other Ambulatory Visit: Payer: Self-pay | Admitting: Family Medicine

## 2022-05-26 DIAGNOSIS — E559 Vitamin D deficiency, unspecified: Secondary | ICD-10-CM

## 2022-05-27 ENCOUNTER — Other Ambulatory Visit: Payer: Self-pay | Admitting: Family Medicine

## 2022-05-27 DIAGNOSIS — N62 Hypertrophy of breast: Secondary | ICD-10-CM

## 2022-06-13 ENCOUNTER — Encounter (HOSPITAL_BASED_OUTPATIENT_CLINIC_OR_DEPARTMENT_OTHER): Payer: Self-pay | Admitting: General Surgery

## 2022-06-13 ENCOUNTER — Other Ambulatory Visit: Payer: Self-pay

## 2022-06-18 ENCOUNTER — Ambulatory Visit (HOSPITAL_BASED_OUTPATIENT_CLINIC_OR_DEPARTMENT_OTHER): Admission: RE | Admit: 2022-06-18 | Payer: BC Managed Care – PPO | Source: Home / Self Care | Admitting: General Surgery

## 2022-06-18 DIAGNOSIS — E119 Type 2 diabetes mellitus without complications: Secondary | ICD-10-CM

## 2022-06-18 SURGERY — BREAST LUMPECTOMY WITH RADIOACTIVE SEED LOCALIZATION
Anesthesia: General | Site: Breast | Laterality: Bilateral

## 2022-07-01 ENCOUNTER — Telehealth: Payer: Self-pay | Admitting: Family Medicine

## 2022-07-01 NOTE — Telephone Encounter (Signed)
[facility] [place] called stating they are needing a letter stating the patient has failed conservative treatment and is recommended surgery for her breast reduction. Info was sent on 9/5. Pt is scheduled to be seen tomorrow. They are trying to get this scheduled because another dr is going to assist.    Fax # [phone]

## 2022-07-01 NOTE — Telephone Encounter (Signed)
Note printed will fax once signed by Dr.Simpson

## 2022-07-02 ENCOUNTER — Ambulatory Visit: Payer: BC Managed Care – PPO | Admitting: Family Medicine

## 2022-07-02 ENCOUNTER — Encounter: Payer: Self-pay | Admitting: Family Medicine

## 2022-07-02 VITALS — BP 116/77 | HR 54 | Resp 12 | Ht 64.0 in | Wt 200.0 lb

## 2022-07-02 DIAGNOSIS — L03213 Periorbital cellulitis: Secondary | ICD-10-CM | POA: Diagnosis not present

## 2022-07-02 DIAGNOSIS — E669 Obesity, unspecified: Secondary | ICD-10-CM

## 2022-07-02 DIAGNOSIS — I10 Essential (primary) hypertension: Secondary | ICD-10-CM | POA: Diagnosis not present

## 2022-07-02 DIAGNOSIS — E1165 Type 2 diabetes mellitus with hyperglycemia: Secondary | ICD-10-CM

## 2022-07-02 DIAGNOSIS — Z794 Long term (current) use of insulin: Secondary | ICD-10-CM

## 2022-07-02 DIAGNOSIS — N62 Hypertrophy of breast: Secondary | ICD-10-CM | POA: Diagnosis not present

## 2022-07-02 DIAGNOSIS — E782 Mixed hyperlipidemia: Secondary | ICD-10-CM

## 2022-07-02 MED ORDER — DOXYCYCLINE HYCLATE 100 MG PO TABS: 100.0000 mg | ORAL_TABLET | Freq: Two times a day (BID) | ORAL | 0 refills | Status: DC

## 2022-07-02 MED ORDER — FLUCONAZOLE 150 MG PO TABS: 150.0000 mg | ORAL_TABLET | Freq: Once | ORAL | 0 refills | Status: AC

## 2022-07-02 NOTE — Patient Instructions (Addendum)
Annual exam in 4 months, call if you need me before  Nurse visit in October for flu vaccine  5 day antibiotic course for right  lower  eyelid redness and swelling, continue twice daily cold compress also  It is important that you exercise regularly at least 30 minutes 5 times a week. If you develop chest pain, have severe difficulty breathing, or feel very tired, stop exercising immediately and seek medical attention   Letter has been sent to [facility]  All the best with upcoming surgery  Nurse pls send for 05/2022 eye exam wih Dr Jorja Loa  Nurse pls send for most recent hBA1C Dr [facility]  Need covid booster  Thanks for choosing [facility], we consider it a privelige to serve you.

## 2022-07-03 LAB — CMP14+EGFR
ALT: 12 IU/L (ref 0–32)
AST: 11 IU/L (ref 0–40)
Albumin/Globulin Ratio: 1.9 (ref 1.2–2.2)
Albumin: 4.5 g/dL (ref 3.9–4.9)
Alkaline Phosphatase: 39 IU/L — ABNORMAL LOW (ref 44–121)
BUN/Creatinine Ratio: 11 (ref 9–23)
BUN: 8 mg/dL (ref 6–24)
Bilirubin Total: 1.1 mg/dL (ref 0.0–1.2)
CO2: 23 mmol/L (ref 20–29)
Calcium: 9.6 mg/dL (ref 8.7–10.2)
Chloride: 106 mmol/L (ref 96–106)
Creatinine, Ser: 0.76 mg/dL (ref 0.57–1.00)
Globulin, Total: 2.4 g/dL (ref 1.5–4.5)
Glucose: 116 mg/dL — ABNORMAL HIGH (ref 70–99)
Potassium: 3.7 mmol/L (ref 3.5–5.2)
Sodium: 145 mmol/L — ABNORMAL HIGH (ref 134–144)
Total Protein: 6.9 g/dL (ref 6.0–8.5)
eGFR: 100 mL/min/{1.73_m2} (ref >59)

## 2022-07-03 LAB — LIPID PANEL
Chol/HDL Ratio: 3.1 ratio (ref 0.0–4.4)
Cholesterol, Total: 104 mg/dL (ref 100–199)
HDL: 34 mg/dL — ABNORMAL LOW (ref >39)
LDL Chol Calc (NIH): 54 mg/dL (ref 0–99)
Triglycerides: 76 mg/dL (ref 0–149)
VLDL Cholesterol Cal: 16 mg/dL (ref 5–40)

## 2022-07-07 ENCOUNTER — Encounter: Payer: Self-pay | Admitting: Family Medicine

## 2022-07-07 NOTE — Assessment & Plan Note (Signed)
Hyperlipidemia:Low fat diet discussed and encouraged.   Lipid Panel  Lab Results  Component Value Date   CHOL 104 07/02/2022   HDL 34 (L) 07/02/2022   LDLCALC 54 07/02/2022   TRIG 76 07/02/2022   CHOLHDL 3.1 07/02/2022     needw to increase physical activity

## 2022-07-07 NOTE — Assessment & Plan Note (Signed)
Mild under right eye, antibiotic course prescribed

## 2022-07-07 NOTE — Progress Notes (Signed)
Kaylee Montgomery     MRN: [id number]      DOB: 24-Jul-1981   HPI Ms. Hinote is here for follow up and re-evaluation of chronic medical conditions, medication management and review of any available recent lab and radiology data.  Preventive health is updated, specifically  Cancer screening and Immunization.   Questions or concerns regarding consultations or procedures which the PT has had in the interim are  addressed. The PT denies any adverse reactions to current medications since the last visit.  4 day h/o puffiness and redness under right eye, tender, itchy, no vision change  Denies polyuria, polydipsia, blurred vision , or hypoglycemic episodes.   ROS Denies recent fever or chills. Denies sinus pressure, nasal congestion, ear pain or sore throat. Denies chest congestion, productive cough or wheezing. Denies chest pains, palpitations and leg swelling Denies abdominal pain, nausea, vomiting,diarrhea or constipation.   Denies dysuria, frequency, hesitancy or incontinence. Denies joint pain, swelling and limitation in mobility. Denies headaches, seizures, numbness, or tingling. Denies depression, anxiety or insomnia.  PE  BP 116/77   Pulse (!) 54   Resp 12   Ht '5\' 4"'$  (1.626 m)   Wt 200 lb (90.7 kg)   SpO2 93%   BMI 34.33 kg/m   Patient alert and oriented and in no cardiopulmonary distress.  HEENT: No facial asymmetry, EOMI,     Neck supple .mild tender erythema under right eye  Chest: Clear to auscultation bilaterally.  CVS: S1, S2 no murmurs, no S3.Regular rate.  ABD: Soft non tender.   Ext: No edema  MS: Adequate ROM spine, shoulders, hips and knees.  Skin: erythema and tender swelling under right eye.  Psych: Good eye contact, normal affect. Memory intact not anxious or depressed appearing.  CNS: CN 2-12 intact, power,  normal throughout.no focal deficits noted.   Assessment & Plan  Cellulitis Mild under right eye, antibiotic course  prescribed  Controlled type 2 diabetes mellitus with hyperglycemia ([place]) Updated lab needed at/ before next visit. Reports good control , will send for update, managed by Endo  Essential hypertension, benign Controlled, no change in medication DASH diet and commitment to daily physical activity for a minimum of 30 minutes discussed and encouraged, as a part of hypertension management. The importance of attaining a healthy weight is also discussed.     07/02/2022    2:09 PM 06/13/2022   10:43 AM 03/20/2022   10:23 AM 02/25/2022    3:39 PM 02/25/2022    2:41 PM 01/14/2022    1:57 PM 01/14/2022    1:09 PM  BP/Weight  Systolic BP 734  193 790 240 973 532  Diastolic BP 77  81 70 79 82 77  Wt. (Lbs) 200 200 201  203.04  201.2  BMI 34.33 kg/m2 34.33 kg/m2 34.5 kg/m2  34.85 kg/m2  34.54 kg/m2       [facility] Letter of suppport for surgery has been sent  Obesity (BMI 30.0-34.9)  Patient re-educated about  the importance of commitment to a  minimum of 150 minutes of exercise per week as able.  The importance of healthy food choices with portion control discussed, as well as eating regularly and within a 12 hour window most days. The need to choose "clean , green" food 50 to 75% of the time is discussed, as well as to make water the primary drink and set a goal of 64 ounces water daily.       07/02/2022    2:09 PM 06/13/2022  10:43 AM 03/20/2022   10:23 AM  Weight /BMI  Weight 200 lb 200 lb 201 lb  Height '5\' 4"'$  (1.626 m) '5\' 4"'$  (1.626 m) '5\' 4"'$  (1.626 m)  BMI 34.33 kg/m2 34.33 kg/m2 34.5 kg/m2      Mixed hyperlipidemia Hyperlipidemia:Low fat diet discussed and encouraged.   Lipid Panel  Lab Results  Component Value Date   CHOL 104 07/02/2022   HDL 34 (L) 07/02/2022   LDLCALC 54 07/02/2022   TRIG 76 07/02/2022   CHOLHDL 3.1 07/02/2022     needw to increase physical activity

## 2022-07-07 NOTE — Assessment & Plan Note (Signed)
Letter of suppport for surgery has been sent

## 2022-07-07 NOTE — Assessment & Plan Note (Signed)
  Patient re-educated about  the importance of commitment to a  minimum of 150 minutes of exercise per week as able.  The importance of healthy food choices with portion control discussed, as well as eating regularly and within a 12 hour window most days. The need to choose "clean , green" food 50 to 75% of the time is discussed, as well as to make water the primary drink and set a goal of 64 ounces water daily.       07/02/2022    2:09 PM 06/13/2022   10:43 AM 03/20/2022   10:23 AM  Weight /BMI  Weight 200 lb 200 lb 201 lb  Height '5\' 4"'$  (1.626 m) '5\' 4"'$  (1.626 m) '5\' 4"'$  (1.626 m)  BMI 34.33 kg/m2 34.33 kg/m2 34.5 kg/m2

## 2022-07-07 NOTE — Assessment & Plan Note (Signed)
Updated lab needed at/ before next visit. Reports good control , will send for update, managed by Endo

## 2022-07-07 NOTE — Assessment & Plan Note (Signed)
Controlled, no change in medication DASH diet and commitment to daily physical activity for a minimum of 30 minutes discussed and encouraged, as a part of hypertension management. The importance of attaining a healthy weight is also discussed.     07/02/2022    2:09 PM 06/13/2022   10:43 AM 03/20/2022   10:23 AM 02/25/2022    3:39 PM 02/25/2022    2:41 PM 01/14/2022    1:57 PM 01/14/2022    1:09 PM  BP/Weight  Systolic BP 872  158 727 618 485 927  Diastolic BP 77  81 70 79 82 77  Wt. (Lbs) 200 200 201  203.04  201.2  BMI 34.33 kg/m2 34.33 kg/m2 34.5 kg/m2  34.85 kg/m2  34.54 kg/m2

## 2022-08-13 ENCOUNTER — Ambulatory Visit: Payer: Self-pay | Admitting: Plastic Surgery

## 2022-08-14 ENCOUNTER — Ambulatory Visit: Payer: Self-pay | Admitting: Plastic Surgery

## 2022-08-14 NOTE — Pre-Procedure Instructions (Signed)
Surgical Instructions    Your procedure is scheduled on Thursday, August 21, 2022 at 8:30 AM.  Report to [facility] Main Entrance "A" at 6:30 A.M., then check in with the Admitting office.  Call this number if you have problems the morning of surgery:  [phone]   If you have any questions prior to your surgery date call [phone]: Open Monday-Friday 8am-4pm  *If you experience any cold or flu symptoms such as cough, fever, chills, shortness of breath, etc. between now and your scheduled surgery, please notify us.*    Remember:  Do not eat after midnight the night before your surgery  You may drink clear liquids until 5:30 AM the morning of your surgery.   Clear liquids allowed are: Water, Non-Citrus Juices (without pulp), Carbonated Beverages, Clear Tea, Black Coffee Only (NO MILK, CREAM OR POWDERED CREAMER of any kind), and Gatorade.    Take these medicines the morning of surgery with A SIP OF WATER:  amLODipine (NORVASC) atorvastatin (LIPITOR) HEATHER  Metoprolol Tartrate  IF NEEDED: oxyCODONE-acetaminophen (PERCOCET/ROXICET)  tamoxifen (NOLVADEX)  As of today, STOP taking any Aspirin (unless otherwise instructed by your surgeon) Aleve, Naproxen, Ibuprofen, Motrin, Advil, Goody's, BC's, all herbal medications, fish oil, and all vitamins.  WHAT DO I DO ABOUT MY DIABETES MEDICATION?   Do not take your repaglinide (PRANDIN) the morning of surgery.  Stop taking your Empagliflozin-metFORMIN HCl ER 72 hours prior to your procedure. Your last dose will be on 08/15/22.  THE NIGHT BEFORE SURGERY, take 5 units of insulin degludec (TRESIBA).   HOW TO MANAGE YOUR DIABETES BEFORE AND AFTER SURGERY  Why is it important to control my blood sugar before and after surgery? Improving blood sugar levels before and after surgery helps healing and can limit problems. A way of improving blood sugar control is eating a healthy diet by:  Eating less sugar and carbohydrates   Increasing activity/exercise  Talking with your doctor about reaching your blood sugar goals High blood sugars (greater than 180 mg/dL) can raise your risk of infections and slow your recovery, so you will need to focus on controlling your diabetes during the weeks before surgery. Make sure that the doctor who takes care of your diabetes knows about your planned surgery including the date and location.  How do I manage my blood sugar before surgery? Check your blood sugar at least 4 times a day, starting 2 days before surgery, to make sure that the level is not too high or low.  Check your blood sugar the morning of your surgery when you wake up and every 2 hours until you get to the Short Stay unit.  If your blood sugar is less than 70 mg/dL, you will need to treat for low blood sugar: Do not take insulin. Treat a low blood sugar (less than 70 mg/dL) with  cup of clear juice (cranberry or apple), 4 glucose tablets, OR glucose gel. Recheck blood sugar in 15 minutes after treatment (to make sure it is greater than 70 mg/dL). If your blood sugar is not greater than 70 mg/dL on recheck, call [phone] for further instructions. Report your blood sugar to the short stay nurse when you get to Short Stay.  If you are admitted to the hospital after surgery: Your blood sugar will be checked by the staff and you will probably be given insulin after surgery (instead of oral diabetes medicines) to make sure you have good blood sugar levels. The goal for blood sugar control after  surgery is 80-180 mg/dL.                      Do NOT Smoke (Tobacco/Vaping) for 24 hours prior to your procedure.  If you use a CPAP at night, you may bring your mask/headgear for your overnight stay.   Contacts, glasses, piercing's, hearing aid's, dentures or partials may not be worn into surgery, please bring cases for these belongings.    For patients admitted to the hospital, discharge time will be determined by your  treatment team.   Patients discharged the day of surgery will not be allowed to drive home, and someone needs to stay with them for 24 hours.  SURGICAL WAITING ROOM VISITATION Patients having surgery or a procedure may have two support people in the waiting area. Visitors may stay in the waiting area during the procedure and switch out with other visitors if needed. Children under the age of 67 must have an adult accompany them who is not the patient. If the patient needs to stay at the hospital during part of their recovery, the visitor guidelines for inpatient rooms apply.  Please refer to the [facility] website for the visitor guidelines for Inpatients (after your surgery is over and you are in a regular room).    Special instructions:   - Preparing For Surgery  Before surgery, you can play an important role. Because skin is not sterile, your skin needs to be as free of germs as possible. You can reduce the number of germs on your skin by washing with CHG (chlorahexidine gluconate) Soap before surgery.  CHG is an antiseptic cleaner which kills germs and bonds with the skin to continue killing germs even after washing.    Oral Hygiene is also important to reduce your risk of infection.  Remember - BRUSH YOUR TEETH THE MORNING OF SURGERY WITH YOUR REGULAR TOOTHPASTE  Please do not use if you have an allergy to CHG or antibacterial soaps. If your skin becomes reddened/irritated stop using the CHG.  Do not shave (including legs and underarms) for at least 48 hours prior to first CHG shower. It is OK to shave your face.  Please follow these instructions carefully.   Shower the NIGHT BEFORE SURGERY and the MORNING OF SURGERY  If you chose to wash your hair, wash your hair first as usual with your normal shampoo.  After you shampoo, rinse your hair and body thoroughly to remove the shampoo.  Use CHG Soap as you would any other liquid soap. You can apply CHG directly to the skin  and wash gently with a scrungie or a clean washcloth.   Apply the CHG Soap to your body ONLY FROM THE NECK DOWN.  Do not use on open wounds or open sores. Avoid contact with your eyes, ears, mouth and genitals (private parts). Wash Face and genitals (private parts)  with your normal soap.   Wash thoroughly, paying special attention to the area where your surgery will be performed.  Thoroughly rinse your body with warm water from the neck down.  DO NOT shower/wash with your normal soap after using and rinsing off the CHG Soap.  Pat yourself dry with a CLEAN TOWEL.  Wear CLEAN PAJAMAS to bed the night before surgery  Place CLEAN SHEETS on your bed the night before your surgery  DO NOT SLEEP WITH PETS.   Day of Surgery: Take a shower with CHG soap. Do not wear jewelry or makeup Do not wear  lotions, powders, perfumes/colognes, or deodorant. Do not shave 48 hours prior to surgery. Do not bring valuables to the hospital.  [facility] is not responsible for any belongings or valuables. Do not wear nail polish, gel polish, artificial nails, or any other type of covering on natural nails (fingers and toes) If you have artificial nails or gel coating that need to be removed by a nail salon, please have this removed prior to surgery. Artificial nails or gel coating may interfere with anesthesia's ability to adequately monitor your vital signs. Wear Clean/Comfortable clothing the morning of surgery Do not apply any deodorants/lotions.   Remember to brush your teeth WITH YOUR REGULAR TOOTHPASTE.   Please read over the following fact sheets that you were given.  If you received a COVID test during your pre-op visit  it is requested that you wear a mask when out in public, stay away from anyone that may not be feeling well and notify your surgeon if you develop symptoms. If you have been in contact with anyone that has tested positive in the last 10 days please notify you surgeon.

## 2022-08-15 ENCOUNTER — Encounter (HOSPITAL_COMMUNITY)
Admission: RE | Admit: 2022-08-15 | Discharge: 2022-08-15 | Disposition: A | Discharge status: Home / Self Care | Payer: BC Managed Care – PPO | Source: Ambulatory Visit | Attending: General Surgery | Admitting: General Surgery

## 2022-08-15 ENCOUNTER — Other Ambulatory Visit: Payer: Self-pay

## 2022-08-15 ENCOUNTER — Encounter (HOSPITAL_COMMUNITY): Payer: Self-pay

## 2022-08-15 VITALS — BP 112/79 | HR 84 | Temp 98.3°F | Resp 18 | Ht 64.0 in | Wt 199.2 lb

## 2022-08-15 DIAGNOSIS — Z01818 Encounter for other preprocedural examination: Secondary | ICD-10-CM | POA: Insufficient documentation

## 2022-08-15 DIAGNOSIS — E1159 Type 2 diabetes mellitus with other circulatory complications: Secondary | ICD-10-CM | POA: Insufficient documentation

## 2022-08-15 HISTORY — DX: Anxiety disorder, unspecified: F41.9

## 2022-08-15 HISTORY — DX: Other specified postprocedural states: Z98.890

## 2022-08-15 HISTORY — DX: Nausea with vomiting, unspecified: R11.2

## 2022-08-15 LAB — CBC
HCT: 42.7 % (ref 36.0–46.0)
Hemoglobin: 13.7 g/dL (ref 12.0–15.0)
MCH: 25.8 pg — ABNORMAL LOW (ref 26.0–34.0)
MCHC: 32.1 g/dL (ref 30.0–36.0)
MCV: 80.3 fL (ref 80.0–100.0)
Platelets: 298 10*3/uL (ref 150–400)
RBC: 5.32 MIL/uL — ABNORMAL HIGH (ref 3.87–5.11)
RDW: 13.2 % (ref 11.5–15.5)
WBC: 5.1 10*3/uL (ref 4.0–10.5)
nRBC: 0 % (ref 0.0–0.2)

## 2022-08-15 LAB — BASIC METABOLIC PANEL
Anion gap: 6 (ref 5–15)
BUN: 6 mg/dL (ref 6–20)
CO2: 24 mmol/L (ref 22–32)
Calcium: 9.3 mg/dL (ref 8.9–10.3)
Chloride: 110 mmol/L (ref 98–111)
Creatinine, Ser: 0.55 mg/dL (ref 0.44–1.00)
GFR, Estimated: >60 mL/min (ref >60)
Glucose, Bld: 95 mg/dL (ref 70–99)
Potassium: 3.4 mmol/L — ABNORMAL LOW (ref 3.5–5.1)
Sodium: 140 mmol/L (ref 135–145)

## 2022-08-15 LAB — HEMOGLOBIN A1C
Hgb A1c MFr Bld: 7.4 % — ABNORMAL HIGH (ref 4.8–5.6)
Mean Plasma Glucose: 165.68 mg/dL

## 2022-08-15 LAB — GLUCOSE, CAPILLARY: Glucose-Capillary: 124 mg/dL — ABNORMAL HIGH (ref 70–99)

## 2022-08-15 LAB — POCT PREGNANCY, URINE: Preg Test, Ur: NEGATIVE

## 2022-08-15 NOTE — Progress Notes (Signed)
PCP - Dr. [name] Cardiologist - Denies  PPM/ICD - Denies  Chest x-ray - N/A EKG - 10/29/21 Stress Test - Denies ECHO - 04/17/17 Cardiac Cath - Denies  Sleep Study - OSA CPAP - No  Fasting Blood Sugar - <120 Checks Blood Sugar 2-3x per week  Blood Thinner Instructions: N/A Aspirin Instructions: N/A  ERAS Protcol - Yes PRE-SURGERY Ensure or G2- No  COVID TEST- N/A  POCT preg: Negative in PAT   Anesthesia review: Yes, seed placement  Patient denies shortness of breath, fever, cough and chest pain at PAT appointment   All instructions explained to the patient, with a verbal understanding of the material. Patient agrees to go over the instructions while at home for a better understanding. Patient also instructed to self quarantine after being tested for COVID-19. The opportunity to ask questions was provided.

## 2022-08-19 ENCOUNTER — Ambulatory Visit
Admission: RE | Admit: 2022-08-19 | Discharge: 2022-08-19 | Disposition: A | Discharge status: Home / Self Care | Payer: BC Managed Care – PPO | Source: Ambulatory Visit | Attending: General Surgery | Admitting: General Surgery

## 2022-08-19 DIAGNOSIS — D242 Benign neoplasm of left breast: Secondary | ICD-10-CM

## 2022-08-19 DIAGNOSIS — D241 Benign neoplasm of right breast: Secondary | ICD-10-CM

## 2022-08-20 ENCOUNTER — Ambulatory Visit: Payer: Self-pay | Admitting: Plastic Surgery

## 2022-08-20 NOTE — Progress Notes (Signed)
Patient voiced understanding of new arrival time of 0530 tomorrow. °

## 2022-08-20 NOTE — Anesthesia Preprocedure Evaluation (Signed)
Anesthesia Evaluation  Patient identified by MRN, date of birth, ID band Patient awake    Reviewed: Allergy & Precautions, NPO status , Patient's Chart, lab work & pertinent test results  History of Anesthesia Complications (+) PONV and history of anesthetic complications  Airway Mallampati: II  TM Distance: >3 FB Neck ROM: Full    Dental no notable dental hx.    Pulmonary sleep apnea (can't tolerate CPAP)    Pulmonary exam normal breath sounds clear to auscultation       Cardiovascular hypertension, Normal cardiovascular exam Rhythm:Regular Rate:Normal     Neuro/Psych  Headaches  Anxiety        GI/Hepatic   Endo/Other  diabetes, Type 2    Renal/GU Lab Results      Component                Value               Date                      CREATININE               0.55                08/15/2022                K                        3.4 (L)             08/15/2022                         Musculoskeletal   Abdominal   Peds  Hematology Lab Results      Component                Value               Date                      WBC                      5.1                 08/15/2022                HGB                      13.7                08/15/2022                HCT                      42.7                08/15/2022                MCV                      80.3                08/15/2022                PLT  298                 08/15/2022              Anesthesia Other Findings ALL Bromocriptine, imitrex  Reproductive/Obstetrics negative OB ROS                             Anesthesia Physical Anesthesia Plan  ASA: 2  Anesthesia Plan: General   Post-op Pain Management: Lidocaine infusion* and Ketamine IV*   Induction: Intravenous  PONV Risk Score and Plan: 4 or greater and Treatment may vary due to age or medical condition, Midazolam, Scopolamine patch - Pre-op,  Ondansetron and Dexamethasone  Airway Management Planned: Oral ETT  Additional Equipment: None  Intra-op Plan:   Post-operative Plan: Extubation in OR  Informed Consent: I have reviewed the patients History and Physical, chart, labs and discussed the procedure including the risks, benefits and alternatives for the proposed anesthesia with the patient or authorized representative who has indicated his/her understanding and acceptance.     Dental advisory given  Plan Discussed with:   Anesthesia Plan Comments:         Anesthesia Quick Evaluation

## 2022-08-21 ENCOUNTER — Ambulatory Visit (HOSPITAL_COMMUNITY): Payer: BC Managed Care – PPO | Admitting: Physician Assistant

## 2022-08-21 ENCOUNTER — Other Ambulatory Visit: Payer: Self-pay

## 2022-08-21 ENCOUNTER — Ambulatory Visit (HOSPITAL_COMMUNITY): Payer: BC Managed Care – PPO | Admitting: Certified Registered Nurse Anesthetist

## 2022-08-21 ENCOUNTER — Ambulatory Visit
Admission: RE | Admit: 2022-08-21 | Discharge: 2022-08-21 | Disposition: A | Discharge status: Home / Self Care | Payer: BC Managed Care – PPO | Source: Ambulatory Visit | Attending: General Surgery | Admitting: General Surgery

## 2022-08-21 ENCOUNTER — Ambulatory Visit (HOSPITAL_COMMUNITY)
Admission: RE | Admit: 2022-08-21 | Discharge: 2022-08-21 | Disposition: A | Discharge status: Home / Self Care | Payer: BC Managed Care – PPO | Attending: General Surgery | Admitting: General Surgery

## 2022-08-21 ENCOUNTER — Encounter (HOSPITAL_COMMUNITY)
Admission: RE | Disposition: A | Discharge status: Home / Self Care | Payer: Self-pay | Source: Home / Self Care | Attending: General Surgery

## 2022-08-21 DIAGNOSIS — D241 Benign neoplasm of right breast: Secondary | ICD-10-CM | POA: Diagnosis not present

## 2022-08-21 DIAGNOSIS — D242 Benign neoplasm of left breast: Secondary | ICD-10-CM | POA: Insufficient documentation

## 2022-08-21 DIAGNOSIS — N6489 Other specified disorders of breast: Secondary | ICD-10-CM | POA: Insufficient documentation

## 2022-08-21 DIAGNOSIS — E1159 Type 2 diabetes mellitus with other circulatory complications: Secondary | ICD-10-CM

## 2022-08-21 DIAGNOSIS — E119 Type 2 diabetes mellitus without complications: Secondary | ICD-10-CM | POA: Insufficient documentation

## 2022-08-21 DIAGNOSIS — I10 Essential (primary) hypertension: Secondary | ICD-10-CM | POA: Insufficient documentation

## 2022-08-21 DIAGNOSIS — N62 Hypertrophy of breast: Secondary | ICD-10-CM | POA: Insufficient documentation

## 2022-08-21 HISTORY — PX: BREAST LUMPECTOMY WITH RADIOACTIVE SEED LOCALIZATION: SHX6424

## 2022-08-21 HISTORY — PX: BREAST REDUCTION SURGERY: SHX8

## 2022-08-21 HISTORY — PX: REDUCTION MAMMAPLASTY: SUR839

## 2022-08-21 HISTORY — PX: BREAST LUMPECTOMY WITH RADIOACTIVE SEED AND SENTINEL LYMPH NODE BIOPSY: SHX6550

## 2022-08-21 LAB — GLUCOSE, CAPILLARY
Glucose-Capillary: 186 mg/dL — ABNORMAL HIGH (ref 70–99)
Glucose-Capillary: 282 mg/dL — ABNORMAL HIGH (ref 70–99)
Glucose-Capillary: 290 mg/dL — ABNORMAL HIGH (ref 70–99)
Glucose-Capillary: 274 mg/dL — ABNORMAL HIGH (ref 70–99)
Glucose-Capillary: 254 mg/dL — ABNORMAL HIGH (ref 70–99)

## 2022-08-21 SURGERY — BREAST LUMPECTOMY WITH RADIOACTIVE SEED LOCALIZATION
Anesthesia: General | Site: Breast | Laterality: Right

## 2022-08-21 MED ORDER — ACETAMINOPHEN 500 MG PO TABS
1000.0000 mg | ORAL_TABLET | ORAL | Status: AC
  Administered 2022-08-21: 1000 mg via ORAL
  Filled 2022-08-21: qty 2

## 2022-08-21 MED ORDER — OXYCODONE HCL 5 MG PO TABS
ORAL_TABLET | ORAL | Status: AC
  Filled 2022-08-21: qty 1

## 2022-08-21 MED ORDER — INSULIN ASPART 100 UNIT/ML IJ SOLN
6.0000 [IU] | Freq: Once | INTRAMUSCULAR | Status: AC
  Administered 2022-08-21: 6 [IU] via SUBCUTANEOUS

## 2022-08-21 MED ORDER — BUPIVACAINE-EPINEPHRINE 0.25% -1:200000 IJ SOLN
INTRAMUSCULAR | Status: DC | PRN
  Administered 2022-08-21: 18 mL

## 2022-08-21 MED ORDER — SODIUM CHLORIDE (PF) 0.9 % IJ SOLN
INTRAMUSCULAR | Status: DC | PRN
  Administered 2022-08-21 (×2): 60 mL via SURGICAL_CAVITY

## 2022-08-21 MED ORDER — PHENYLEPHRINE HCL-NACL 20-0.9 MG/250ML-% IV SOLN
INTRAVENOUS | Status: DC | PRN
  Administered 2022-08-21: 30 ug/min via INTRAVENOUS

## 2022-08-21 MED ORDER — FENTANYL CITRATE (PF) 100 MCG/2ML IJ SOLN
25.0000 ug | INTRAMUSCULAR | Status: DC | PRN
  Administered 2022-08-21 (×2): 50 ug via INTRAVENOUS

## 2022-08-21 MED ORDER — CHLORHEXIDINE GLUCONATE CLOTH 2 % EX PADS: 6.0000 | MEDICATED_PAD | Freq: Once | CUTANEOUS | Status: DC

## 2022-08-21 MED ORDER — DEXAMETHASONE SODIUM PHOSPHATE 10 MG/ML IJ SOLN
INTRAMUSCULAR | Status: DC | PRN
  Administered 2022-08-21: 10 mg via INTRAVENOUS

## 2022-08-21 MED ORDER — MIDAZOLAM HCL 2 MG/2ML IJ SOLN
INTRAMUSCULAR | Status: DC | PRN
  Administered 2022-08-21: 2 mg via INTRAVENOUS

## 2022-08-21 MED ORDER — MIDAZOLAM HCL 2 MG/2ML IJ SOLN
INTRAMUSCULAR | Status: AC
  Filled 2022-08-21: qty 2

## 2022-08-21 MED ORDER — LIDOCAINE 2% (20 MG/ML) 5 ML SYRINGE
INTRAMUSCULAR | Status: DC | PRN
  Administered 2022-08-21: 50 mg via INTRAVENOUS

## 2022-08-21 MED ORDER — PROPOFOL 10 MG/ML IV BOLUS
INTRAVENOUS | Status: AC
  Filled 2022-08-21: qty 20

## 2022-08-21 MED ORDER — CEFAZOLIN SODIUM-DEXTROSE 2-4 GM/100ML-% IV SOLN
2.0000 g | INTRAVENOUS | Status: AC
  Administered 2022-08-21 (×2): 2 g via INTRAVENOUS

## 2022-08-21 MED ORDER — OXYCODONE HCL 5 MG PO TABS
5.0000 mg | ORAL_TABLET | Freq: Once | ORAL | Status: AC | PRN
  Administered 2022-08-21: 5 mg via ORAL

## 2022-08-21 MED ORDER — GABAPENTIN 300 MG PO CAPS
300.0000 mg | ORAL_CAPSULE | ORAL | Status: AC
  Administered 2022-08-21: 300 mg via ORAL
  Filled 2022-08-21: qty 1

## 2022-08-21 MED ORDER — ROCURONIUM BROMIDE 10 MG/ML (PF) SYRINGE
PREFILLED_SYRINGE | INTRAVENOUS | Status: DC | PRN
  Administered 2022-08-21: 30 mg via INTRAVENOUS
  Administered 2022-08-21: 20 mg via INTRAVENOUS
  Administered 2022-08-21: 50 mg via INTRAVENOUS
  Administered 2022-08-21 (×4): 20 mg via INTRAVENOUS

## 2022-08-21 MED ORDER — CEFAZOLIN SODIUM-DEXTROSE 2-4 GM/100ML-% IV SOLN
2.0000 g | INTRAVENOUS | Status: DC
  Filled 2022-08-21: qty 100

## 2022-08-21 MED ORDER — LACTATED RINGERS IV SOLN: INTRAVENOUS | Status: DC | PRN

## 2022-08-21 MED ORDER — SUGAMMADEX SODIUM 200 MG/2ML IV SOLN
INTRAVENOUS | Status: DC | PRN
  Administered 2022-08-21 (×2): 200 mg via INTRAVENOUS

## 2022-08-21 MED ORDER — 0.9 % SODIUM CHLORIDE (POUR BTL) OPTIME
TOPICAL | Status: DC | PRN
  Administered 2022-08-21: 1000 mL

## 2022-08-21 MED ORDER — LIDOCAINE IN D5W 4-5 MG/ML-% IV SOLN
2.0000 mg/min | INTRAVENOUS | Status: DC
  Administered 2022-08-21: 2 mg/min via INTRAVENOUS

## 2022-08-21 MED ORDER — CHLORHEXIDINE GLUCONATE 0.12 % MT SOLN
15.0000 mL | Freq: Once | OROMUCOSAL | Status: AC
  Administered 2022-08-21: 15 mL via OROMUCOSAL

## 2022-08-21 MED ORDER — ORAL CARE MOUTH RINSE: 15.0000 mL | Freq: Once | OROMUCOSAL | Status: AC

## 2022-08-21 MED ORDER — ONDANSETRON HCL 4 MG/2ML IJ SOLN
INTRAMUSCULAR | Status: DC | PRN
  Administered 2022-08-21: 4 mg via INTRAVENOUS

## 2022-08-21 MED ORDER — FENTANYL CITRATE (PF) 250 MCG/5ML IJ SOLN
INTRAMUSCULAR | Status: DC | PRN
  Administered 2022-08-21 (×2): 50 ug via INTRAVENOUS
  Administered 2022-08-21: 150 ug via INTRAVENOUS

## 2022-08-21 MED ORDER — EPHEDRINE 5 MG/ML INJ
INTRAVENOUS | Status: AC
  Filled 2022-08-21: qty 5

## 2022-08-21 MED ORDER — CHLORHEXIDINE GLUCONATE 0.12 % MT SOLN
15.0000 mL | Freq: Once | OROMUCOSAL | Status: DC
  Filled 2022-08-21: qty 15

## 2022-08-21 MED ORDER — LIDOCAINE-EPINEPHRINE 1 %-1:100000 IJ SOLN
INTRAMUSCULAR | Status: AC
  Filled 2022-08-21: qty 2

## 2022-08-21 MED ORDER — LACTATED RINGERS IV SOLN: INTRAVENOUS | Status: DC

## 2022-08-21 MED ORDER — SCOPOLAMINE 1 MG/3DAYS TD PT72
1.0000 | MEDICATED_PATCH | TRANSDERMAL | Status: DC
  Administered 2022-08-21: 1.5 mg via TRANSDERMAL
  Filled 2022-08-21: qty 1

## 2022-08-21 MED ORDER — ONDANSETRON HCL 4 MG/2ML IJ SOLN: 4.0000 mg | Freq: Once | INTRAMUSCULAR | Status: DC | PRN

## 2022-08-21 MED ORDER — PROPOFOL 10 MG/ML IV BOLUS
INTRAVENOUS | Status: DC | PRN
  Administered 2022-08-21: 170 mg via INTRAVENOUS

## 2022-08-21 MED ORDER — OXYCODONE HCL 5 MG/5ML PO SOLN: 5.0000 mg | Freq: Once | ORAL | Status: AC | PRN

## 2022-08-21 MED ORDER — OXYCODONE HCL 5 MG PO TABS: 5.0000 mg | ORAL_TABLET | Freq: Four times a day (QID) | ORAL | 0 refills | Status: DC | PRN

## 2022-08-21 MED ORDER — FENTANYL CITRATE (PF) 100 MCG/2ML IJ SOLN
INTRAMUSCULAR | Status: AC
  Filled 2022-08-21: qty 2

## 2022-08-21 MED ORDER — ACETAMINOPHEN 10 MG/ML IV SOLN
1000.0000 mg | Freq: Once | INTRAVENOUS | Status: DC | PRN
  Administered 2022-08-21: 1000 mg via INTRAVENOUS

## 2022-08-21 MED ORDER — EPHEDRINE SULFATE-NACL 50-0.9 MG/10ML-% IV SOSY
PREFILLED_SYRINGE | INTRAVENOUS | Status: DC | PRN
  Administered 2022-08-21: 10 mg via INTRAVENOUS

## 2022-08-21 MED ORDER — BUPIVACAINE-EPINEPHRINE (PF) 0.25% -1:200000 IJ SOLN
INTRAMUSCULAR | Status: AC
  Filled 2022-08-21: qty 30

## 2022-08-21 MED ORDER — AMISULPRIDE (ANTIEMETIC) 5 MG/2ML IV SOLN: 10.0000 mg | Freq: Once | INTRAVENOUS | Status: DC | PRN

## 2022-08-21 MED ORDER — BACITRACIN ZINC 500 UNIT/GM EX OINT
TOPICAL_OINTMENT | CUTANEOUS | Status: AC
  Filled 2022-08-21: qty 28.35

## 2022-08-21 MED ORDER — BUPIVACAINE LIPOSOME 1.3 % IJ SUSP
INTRAMUSCULAR | Status: AC
  Filled 2022-08-21: qty 20

## 2022-08-21 MED ORDER — CELECOXIB 200 MG PO CAPS
200.0000 mg | ORAL_CAPSULE | ORAL | Status: AC
  Administered 2022-08-21: 200 mg via ORAL
  Filled 2022-08-21: qty 1

## 2022-08-21 MED ORDER — ACETAMINOPHEN 10 MG/ML IV SOLN
INTRAVENOUS | Status: AC
  Filled 2022-08-21: qty 100

## 2022-08-21 MED ORDER — ORAL CARE MOUTH RINSE: 15.0000 mL | Freq: Once | OROMUCOSAL | Status: DC

## 2022-08-21 MED ORDER — FENTANYL CITRATE (PF) 250 MCG/5ML IJ SOLN
INTRAMUSCULAR | Status: AC
  Filled 2022-08-21: qty 5

## 2022-08-21 MED ORDER — BACITRACIN ZINC 500 UNIT/GM EX OINT
TOPICAL_OINTMENT | CUTANEOUS | Status: DC | PRN
  Administered 2022-08-21: 1 via TOPICAL

## 2022-08-21 SURGICAL SUPPLY — 75 items
ADH SKN CLS APL DERMABOND .7 (GAUZE/BANDAGES/DRESSINGS)
ALCOHOL 70% 16 OZ (MISCELLANEOUS) ×3 IMPLANT
APPLIER CLIP 9.375 MED OPEN (MISCELLANEOUS) ×3
APR CLP MED 9.3 20 MLT OPN (MISCELLANEOUS) ×3
BIT DRILL CANNULATED 13.0X300M (BIT) IMPLANT
BLADE KNIFE  10 PERSONNA (BLADE) ×6 IMPLANT
BLADE KNIFE PERSONA 15 (BLADE) ×6 IMPLANT
BLADE SURG 10 STRL SS (BLADE) IMPLANT
BLADE SURG 15 STRL LF DISP TIS (BLADE) IMPLANT
BLADE SURG 15 STRL SS (BLADE) ×12
BNDG GAUZE DERMACEA FLUFF 4 (GAUZE/BANDAGES/DRESSINGS) ×6 IMPLANT
BNDG GZE DERMACEA 4 6PLY (GAUZE/BANDAGES/DRESSINGS) ×6
CANISTER SUCT 3000ML PPV (MISCELLANEOUS) ×6 IMPLANT
CLIP APPLIE 9.375 MED OPEN (MISCELLANEOUS) IMPLANT
CLSR STERI-STRIP ANTIMIC 1/2X4 (GAUZE/BANDAGES/DRESSINGS) IMPLANT
COUNTER NEEDLE 20 DBL MAG RED (NEEDLE) ×3 IMPLANT
COVER PROBE W GEL 5X96 (DRAPES) ×3 IMPLANT
COVER SURGICAL LIGHT HANDLE (MISCELLANEOUS) ×3 IMPLANT
DERMABOND ADVANCED .7 DNX12 (GAUZE/BANDAGES/DRESSINGS) ×3 IMPLANT
DEVICE DUBIN SPECIMEN MAMMOGRA (MISCELLANEOUS) ×3 IMPLANT
DRAIN CHANNEL 10F 3/8 F FF (DRAIN) ×6 IMPLANT
DRAPE HALF SHEET 40X57 (DRAPES) ×6 IMPLANT
DRAPE ORTHO SPLIT 77X108 STRL (DRAPES) ×6
DRAPE SURG ORHT 6 SPLT 77X108 (DRAPES) ×6 IMPLANT
DRILL BIT CANNULATED 13.0X300M (BIT) ×6
DRSG ADAPTIC 3X8 NADH LF (GAUZE/BANDAGES/DRESSINGS) IMPLANT
DRSG TELFA 3X8 NADH STRL (GAUZE/BANDAGES/DRESSINGS) IMPLANT
ELECT CAUTERY BLADE 6.4 (BLADE) ×3 IMPLANT
ELECT COATED BLADE 2.86 ST (ELECTRODE) ×3 IMPLANT
ELECT REM PT RETURN 9FT ADLT (ELECTROSURGICAL) ×6
ELECTRODE REM PT RTRN 9FT ADLT (ELECTROSURGICAL) ×6 IMPLANT
EVACUATOR SILICONE 100CC (DRAIN) ×6 IMPLANT
GAUZE PAD ABD 8X10 STRL (GAUZE/BANDAGES/DRESSINGS) ×6 IMPLANT
GAUZE SPONGE 4X4 12PLY STRL (GAUZE/BANDAGES/DRESSINGS) ×3 IMPLANT
GAUZE SPONGE 4X4 12PLY STRL LF (GAUZE/BANDAGES/DRESSINGS) IMPLANT
GLOVE BIO SURGEON STRL SZ 6.5 (GLOVE) ×6 IMPLANT
GLOVE BIO SURGEON STRL SZ7.5 (GLOVE) ×6 IMPLANT
GOWN STRL REUS W/ TWL LRG LVL3 (GOWN DISPOSABLE) ×12 IMPLANT
GOWN STRL REUS W/TWL LRG LVL3 (GOWN DISPOSABLE) ×12
KIT BASIN OR (CUSTOM PROCEDURE TRAY) ×6 IMPLANT
KIT MARKER MARGIN INK (KITS) ×3 IMPLANT
KIT TURNOVER KIT B (KITS) ×3 IMPLANT
MARKER SKIN DUAL TIP RULER LAB (MISCELLANEOUS) ×3 IMPLANT
NDL HYPO 25GX1X1/2 BEV (NEEDLE) ×6 IMPLANT
NDL SPNL 18GX3.5 QUINCKE PK (NEEDLE) ×3 IMPLANT
NEEDLE HYPO 25GX1X1/2 BEV (NEEDLE) ×6 IMPLANT
NEEDLE SPNL 18GX3.5 QUINCKE PK (NEEDLE) ×6 IMPLANT
NS IRRIG 1000ML POUR BTL (IV SOLUTION) ×3 IMPLANT
PACK GENERAL/GYN (CUSTOM PROCEDURE TRAY) ×6 IMPLANT
PAD ARMBOARD 7.5X6 YLW CONV (MISCELLANEOUS) ×6 IMPLANT
PIN SAFETY STERILE (MISCELLANEOUS) ×3 IMPLANT
PREFILTER EVAC NS 1 1/3-3/8IN (MISCELLANEOUS) ×3 IMPLANT
PREFILTER SMOKE EVAC (FILTER) ×3 IMPLANT
SOL PREP POV-IOD 4OZ 10% (MISCELLANEOUS) IMPLANT
SPECIMEN JAR LARGE (MISCELLANEOUS) ×3 IMPLANT
SPONGE T-LAP 18X18 ~~LOC~~+RFID (SPONGE) IMPLANT
STAPLER VISISTAT 35W (STAPLE) ×6 IMPLANT
STRIP CLOSURE SKIN 1/2X4 (GAUZE/BANDAGES/DRESSINGS) ×9 IMPLANT
SUT ETHILON 3 0 PS 1 (SUTURE) ×3 IMPLANT
SUT MNCRL AB 3-0 PS2 18 (SUTURE) ×18 IMPLANT
SUT MNCRL AB 4-0 PS2 18 (SUTURE) ×12 IMPLANT
SUT MON AB 5-0 PS2 18 (SUTURE) ×6 IMPLANT
SUT PROLENE 2 0 CT2 30 (SUTURE) ×3 IMPLANT
SUT PROLENE 3 0 PS 1 (SUTURE) IMPLANT
SUT SILK 2 0 SH (SUTURE) IMPLANT
SUT VIC AB 3-0 SH 18 (SUTURE) ×3 IMPLANT
SUT VLOC 90 3-0 CLR P12 (SUTURE) IMPLANT
SYR BULB IRRIG 60ML STRL (SYRINGE) ×3 IMPLANT
SYR CONTROL 10ML LL (SYRINGE) ×6 IMPLANT
TAPE MEASURE VINYL STERILE (MISCELLANEOUS) IMPLANT
TOWEL GREEN STERILE (TOWEL DISPOSABLE) ×3 IMPLANT
TOWEL GREEN STERILE FF (TOWEL DISPOSABLE) ×3 IMPLANT
TRAY FOLEY W/BAG SLVR 16FR (SET/KITS/TRAYS/PACK) ×3
TRAY FOLEY W/BAG SLVR 16FR ST (SET/KITS/TRAYS/PACK) ×3 IMPLANT
TUBE CONNECTING 12X1/4 (SUCTIONS) ×6 IMPLANT

## 2022-08-21 NOTE — Interval H&P Note (Signed)
History and Physical Interval Note:  08/21/2022 7:27 AM  Kaylee Montgomery  has presented today for surgery, with the diagnosis of BILATERAL BREAST PAPILLOMAS.  The various methods of treatment have been discussed with the patient and family. After consideration of risks, benefits and other options for treatment, the patient has consented to  Procedure(s): BILATERAL BREAST LUMPECTOMY WITH RADIOACTIVE SEED LOCALIZATION (Bilateral) MAMMARY REDUCTION  (BREAST) (Bilateral) as a surgical intervention.  The patient's history has been reviewed, patient examined, no change in status, stable for surgery.  I have reviewed the patient's chart and labs.  Questions were answered to the patient's satisfaction.     [name]

## 2022-08-21 NOTE — Anesthesia Procedure Notes (Signed)
Procedure Name: Intubation Date/Time: 08/21/2022 7:56 AM  Performed by: Griffin Dakin, CRNAPre-anesthesia Checklist: Patient identified, Emergency Drugs available, Suction available and Patient being monitored Patient Re-evaluated:Patient Re-evaluated prior to induction Oxygen Delivery Method: Circle system utilized Preoxygenation: Pre-oxygenation with 100% oxygen Induction Type: IV induction Ventilation: Mask ventilation without difficulty and Oral airway inserted - appropriate to patient size Laryngoscope Size: Mac and 4 Grade View: Grade II Tube type: Oral Tube size: 7.0 mm Number of attempts: 1 Airway Equipment and Method: Stylet and Oral airway Placement Confirmation: ETT inserted through vocal cords under direct vision, positive ETCO2 and breath sounds checked- equal and bilateral Secured at: 22 cm Tube secured with: Tape Dental Injury: Teeth and Oropharynx as per pre-operative assessment

## 2022-08-21 NOTE — H&P (Signed)
MRN: [id number] DOB: 1981/07/21 Subjective   Chief Complaint: New Problem (Right Breast)   History of Present Illness: Kaylee Montgomery is a 41 y.o. female who is seen today for intraductal papillomas. 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 In May she started to experience bloody nipple discharge from the right breast. She went to her doctors and was evaluated with mammogram and ultrasound. She was found to have a 1 cm intraductal mass in the 6 o'clock position of the retroareolar right breast. She was also found to have calcifications in the upper outer quadrant of the left breast. She had 1 right breast biopsy and 2 left breast biopsies that both showed intraductal papillomas. Because of the appearance on mammogram and because of the bloody nipple discharge the recommendation was to have these areas removed.    Review of Systems: A complete review of systems was obtained from the patient. I have reviewed this information and discussed as appropriate with the patient. See HPI as well for other ROS.  ROS   Medical History: Past Medical History:  Diagnosis Date  Hypertension   Patient Active Problem List  Diagnosis  Atypical ductal hyperplasia of left breast  Intraductal papilloma of right breast  Intraductal papilloma of breast, left   Past Surgical History:  Procedure Laterality Date  left lumpectomy    Allergies  Allergen Reactions  Bromocriptine Mesylate Nausea and Unknown  Hydromorphone Itching and Unknown  Methylprednisolone Acetate Rash  Sumatriptan Rash and Unknown   Current Outpatient Medications on File Prior to Visit  Medication Sig Dispense Refill  acetaminophen (TYLENOL) 500 MG tablet Take by mouth  amLODIPine (NORVASC) 10 MG tablet Take 10 mg by mouth once daily  atorvastatin (LIPITOR) 20 MG tablet  betamethasone dipropionate (DIPROSONE) 0.05 % cream Apply topically  2 (two) times daily  cyclobenzaprine (FLEXERIL) 10 MG tablet  empagliflozin-metformin (SYNJARDY XR) 12.5-1,000 mg XR 24 hr biphasic tablet Take 1 tablet by mouth 2 (two) times daily  ergocalciferol, vitamin D2, 1,250 mcg (50,000 unit) capsule Take 1 capsule by mouth every 7 (seven) days  fluconazole (DIFLUCAN) 150 MG tablet TAKE 1 TABLET BY MOUTH NOW. REPEAT DOSE IN 72 HOURS  gabapentin (NEURONTIN) 300 MG capsule Take by mouth  glipiZIDE (GLUCOTROL) 2.5 MG XL tablet  metoprolol tartrate 75 mg Tab Take one tablet by mouth two times daily  nystatin (MYCOSTATIN) 100,000 unit/gram powder Apply topically  TRESIBA FLEXTOUCH U-100 pen injector (concentration 100 units/mL) ADMINISTER 10 UNITS UNDER THE SKIN EVERY DAY IN THE EVENING   No current facility-administered medications on file prior to visit.   Family History  Problem Relation Age of Onset  High blood pressure (Hypertension) Father    Social History   Tobacco Use  Smoking Status Never  Smokeless Tobacco Never    Social History   Socioeconomic History  Marital status: Single  Tobacco Use  Smoking status: Never  Smokeless tobacco: Never  Substance and Sexual Activity  Alcohol use: Never  Drug use: Never   Objective:   There were no vitals filed for this visit.  There is no height or weight on file to calculate BMI.  Physical Exam Vitals reviewed.  Constitutional:  General: She is not in acute distress. Appearance: Normal appearance.  HENT:  Head: Normocephalic and atraumatic.  Right Ear: External ear normal.  Left Ear: External ear normal.  Nose: Nose normal.  Mouth/Throat:  Mouth: Mucous membranes are moist.  Pharynx: Oropharynx is clear.  Eyes:  General: No scleral icterus. Extraocular Movements: Extraocular movements intact.  Conjunctiva/sclera: Conjunctivae normal.  Pupils: Pupils are equal, round, and reactive to light.  Cardiovascular:  Rate and Rhythm: Normal rate and regular rhythm.  Pulses: Normal  pulses.  Heart sounds: Normal heart sounds.  Pulmonary:  Effort: Pulmonary effort is normal. No respiratory distress.  Breath sounds: Normal breath sounds.  Abdominal:  General: Bowel sounds are normal.  Palpations: Abdomen is soft.  Tenderness: There is no abdominal tenderness.  Musculoskeletal:  General: No swelling, tenderness or deformity. Normal range of motion.  Cervical back: Normal range of motion and neck supple.  Skin: General: Skin is warm and dry.  Coloration: Skin is not jaundiced.  Neurological:  General: No focal deficit present.  Mental Status: She is alert and oriented to person, place, and time.  Psychiatric:  Mood and Affect: Mood normal.  Behavior: Behavior normal.    Breast: There is no palpable mass in either breast. There is no palpable axillary, supraclavicular, or cervical lymphadenopathy.  Labs, Imaging and Diagnostic Testing:  Assessment and Plan:   Diagnoses and all orders for this visit:  Intraductal papilloma of right breast  Intraductal papilloma of breast, left    The patient appears to have an intraductal papilloma with bloody nipple discharge on the right as well as suspicious appearing calcifications on the left with papillomas. Because of the suspicious nature of all of these areas the recommendation is to have them removed. I have discussed with her in detail the risks and benefits of the operation as well as some of the technical aspects including the use of radioactive seeds for localization and she understands and wishes to proceed. She will continue to take tamoxifen for prophylaxis.

## 2022-08-21 NOTE — Anesthesia Postprocedure Evaluation (Signed)
Anesthesia Post Note  Patient: Kaylee Montgomery  Procedure(s) Performed: LEFT BREAST LUMPECTOMY WITH RADIOACTIVE SEED LOCALIZATION SEEDS X2 (Left: Breast) RIGHT BREAST LUMPECTOMY WITH RADIOACTIVE SEED X1 (Right: Breast) BILATERAL MAMMOPLASTY REDUCTION  (BREAST) (Bilateral: Breast)     Patient location during evaluation: PACU Anesthesia Type: General Level of consciousness: awake and alert Pain management: pain level controlled Vital Signs Assessment: post-procedure vital signs reviewed and stable Respiratory status: spontaneous breathing, nonlabored ventilation, respiratory function stable and patient connected to nasal cannula oxygen Cardiovascular status: blood pressure returned to baseline and stable Postop Assessment: no apparent nausea or vomiting Anesthetic complications: no   No notable events documented.  Last Vitals:  Vitals:   08/21/22 1600 08/21/22 1615  BP: 105/75 110/79  Pulse: 96 100  Resp: 16 16  Temp:  37 C  SpO2: 95% 95%    Last Pain:  Vitals:   08/21/22 1615  TempSrc:   PainSc: 3    Pain Goal:                   Barnet Glasgow

## 2022-08-21 NOTE — Discharge Instructions (Addendum)
Kaylee Montgomery,Kaylee Montgomery  1. No lifting greater than 5 lbs with arms for 4 weeks. 2. Empty, strip, record and reactivate JP drains 3 times Montgomery day. 3. Percocet 5/325 mg tabs 1-2 tabs po q 4-6 hours prn pain- prescription given in office. 4. Duricef 1 tab po bid- prescription given in office. 5. Sterapred dose pack as directed- prescription given in office. 6. Follow-up appointment Monday 11/6 in office.

## 2022-08-21 NOTE — H&P (Signed)
  H&P faxed to surgical center.  -History and Physical Reviewed  -Patient has been re-examined  -No change in the plan of care  Kaylee Montgomery A    

## 2022-08-21 NOTE — Brief Op Note (Signed)
08/21/2022  1:33 PM  PATIENT:  Kaylee Montgomery  41 y.o. female  PRE-OPERATIVE DIAGNOSIS:  1) BILATERAL BREAST PAPILLOMAS  and 2) BILATERAL MACROMASTIA  POST-OPERATIVE DIAGNOSIS:  1) BILATERAL BREAST PAPILLOMAS and 2) BILATERAL MACROMASTIA  PROCEDURE:  Procedure(s): LEFT BREAST LUMPECTOMY WITH RADIOACTIVE SEED LOCALIZATION SEEDS X2 (Left) BILATERAL MAMMOPLASTY REDUCTION  (BREAST) (Bilateral) RIGHT BREAST LUMPECTOMY WITH RADIOACTIVE SEED X1 (Right)  SURGEON:  Surgeon(s) and Role: Panel 1:    Jovita Kussmaul, MD - Primary Panel 2:    * Nerine Pulse, Audrea Muscat, MD - Primary  ASSISTANTS: Pryor Curia, RNFA  ANESTHESIA:   general  EBL:  100 mL   BLOOD ADMINISTERED:none  DRAINS: (3F) Jackson-Pratt drain(s) with closed bulb suction in the Bilateral Breasts    LOCAL MEDICATIONS USED:  OTHER 1.3% Exparel 266 mgs. total  SPECIMEN:  Source of Specimen:  Bilateral Breasts  DISPOSITION OF SPECIMEN:  PATHOLOGY  COUNTS:  YES  DICTATION: .Note written in EPIC  PLAN OF CARE: Discharge to home after PACU  PATIENT DISPOSITION:  PACU - hemodynamically stable.   Delay start of Pharmacological VTE agent (>24hrs) due to surgical blood loss or risk of bleeding: not applicable

## 2022-08-21 NOTE — Op Note (Signed)
08/21/2022  9:10 AM  PATIENT:  Kaylee Montgomery  41 y.o. female  PRE-OPERATIVE DIAGNOSIS:  BILATERAL BREAST PAPILLOMAS  POST-OPERATIVE DIAGNOSIS:  BILATERAL BREAST PAPILLOMAS  PROCEDURE:  Procedure(s): LEFT BREAST LUMPECTOMY WITH RADIOACTIVE SEED LOCALIZATION SEEDS X2 (Left) RIGHT BREAST LUMPECTOMY WITH RADIOACTIVE SEED X1 (Right)  SURGEON:  Surgeon(s) and Role: Panel 1:    Jovita Kussmaul, MD - Primary  PHYSICIAN ASSISTANT:   ASSISTANTS: none   ANESTHESIA:   local and general  EBL:  minimal   BLOOD ADMINISTERED:none  DRAINS: none   LOCAL MEDICATIONS USED:  MARCAINE     SPECIMEN:  Source of Specimen:  left breast tissue x 2, right breast tissue  DISPOSITION OF SPECIMEN:  PATHOLOGY  COUNTS:  YES  TOURNIQUET:  * No tourniquets in log *  DICTATION: .Dragon Dictation  After informed consent was obtained the patient was brought to the operating room and placed in the supine position on the operating table.  After adequate induction of general anesthesia the patient's bilateral chest, breast, and axillary areas were prepped with Betadine and draped in usual sterile manner.  An appropriate timeout was performed.  Previously 2 I-125 seeds were placed in the left breast laterally and one in the right breast centrally to mark areas of intraductal papillomas.  Attention was first turned to the left breast.  The neoprobe was set to I-125 in the area of radioactivity in both locations was readily identified.  The area around each was infiltrated with quarter percent Marcaine.  A small incision was made with a 15 blade knife along the plastic surgeons Wise incision.  The incision was carried through the skin and subcutaneous tissue sharply with the electrocautery.  Dissection was then carried towards both of the radioactive seeds.  Once I more closely approach each seed I then removed a circular portion of breast tissue sharply around each seed while checking the area of radioactivity  frequently.  Once each specimen was removed and both were marked appropriately with paint colors.  Specimen radiographs were obtained that showed the clips and seeds to be within each specimen.  The specimens were distinguished by medial and lateral.  Both specimens were then sent to pathology for further evaluation.  Hemostasis was achieved using the Bovie electrocautery.  Each wound was then packed with a lap sponge.  Attention was then turned to the right breast.  The neoprobe was used to identify the radioactive seed in the lower inner subareolar right breast.  The area around this was infiltrated with quarter percent Marcaine.  A small periareolar incision was made with a 15 blade knife closest to the seed.  The incision was carried through the skin and subcutaneous tissue sharply with the electrocautery.  Dissection was then carried towards the radioactive seed under the direction of the neoprobe.  Once I more closely approached the radioactive seed I then removed the circular portion of breast tissue sharply with the electrocautery around the radioactive seed while checking the area of radioactivity frequently.  Once the specimen was removed it was oriented with the appropriate paint colors.  A specimen radiograph was obtained that showed the clip and seed to be within the specimen.  The specimen was then sent to pathology for further evaluation.  Hemostasis was achieved using the Bovie electrocautery.  The wound was then packed with a lap sponge.  The patient tolerated the procedure well.  All needle sponge and instrument counts were correct.  At this point the operation was turned over  to Dr. Nathanial Montgomery for the reduction.  Her portion will be dictated separately.  PLAN OF CARE: Discharge to home after PACU  PATIENT DISPOSITION:  PACU - hemodynamically stable.   Delay start of Pharmacological VTE agent (>24hrs) due to surgical blood loss or risk of bleeding: not applicable

## 2022-08-21 NOTE — Progress Notes (Signed)
CBG 186 on arrival to [facility]. Surgery time > 4 hours. Pt to be excluded from peri-op diabetic protocol. Dr. Valma Cava made aware and verbalized understanding. CBG will be monitored intra-op.  Jacqlyn Larsen, RN

## 2022-08-21 NOTE — Transfer of Care (Signed)
Immediate Anesthesia Transfer of Care Note  Patient: Kaylee Montgomery  Procedure(s) Performed: LEFT BREAST LUMPECTOMY WITH RADIOACTIVE SEED LOCALIZATION SEEDS X2 (Left: Breast) RIGHT BREAST LUMPECTOMY WITH RADIOACTIVE SEED X1 (Right: Breast) BILATERAL MAMMOPLASTY REDUCTION  (BREAST) (Bilateral: Breast)  Patient Location: PACU  Anesthesia Type:General  Level of Consciousness: drowsy  Airway & Oxygen Therapy: Patient Spontanous Breathing  Post-op Assessment: Report given to RN and Post -op Vital signs reviewed and stable  Post vital signs: Reviewed and stable  Last Vitals:  Vitals Value Taken Time  BP 120/108 08/21/22 1352  Temp    Pulse 104 08/21/22 1357  Resp 0 08/21/22 1357  SpO2 100 % 08/21/22 1357  Vitals shown include unvalidated device data.  Last Pain:  Vitals:   08/21/22 0615  TempSrc:   PainSc: 0-No pain         Complications: No notable events documented.

## 2022-08-21 NOTE — Op Note (Signed)
OPERATIVE REPORT  08/21/2022  Kaylee Montgomery  PREOPERATIVE DIAGNOSIS:  Bilateral Macromastia.  POSTOPERATIVE DIAGNOSIS:  Bilateral Macromastia.  PROCEDURE:  Bilateral Reduction Mammoplasties.  ATTENDING SURGEON:  Kaylee Roys, MD  ANESTHESIA:  General.  ANESTHESIOLOGIST:  , MD  COMPLICATIONS:  None.  INDICATIONS FOR THE PROCEDURE:  The patient is a 41 y.o. female who has bilateral macromastia that is clinically symptomatic.  She presents to undergo bilateral reduction mammoplasties. Additionally she had 2 suspicious breast masses in the left breast and one suspicious breast mass in the right breast that were marked with radioactive seeds and excised by Dr. Autumn Messing before the [facility] surgery was performed.  DESCRIPTION OF PROCEDURE:  The patient was marked in preop holding area in a pattern of Wise for the future bilateral reduction mammoplasties. She was then taken back to the OR, placed on the table in supine position.  After adequate general anesthesia was obtained, the patient's chest was prepped with Betadine and alcohol and draped in sterile fashion.  The bases of the breasts have been infiltrated with 1% lidocaine with epinephrine.  After adequate hemostasis and anesthesia taken effect, the procedure was begun.  Prior to performing the Breast Reductions, Dr. Autumn Messing excised the two suspicious breast masses with seeds in the left breast and the one suspicious breast mass with seeds in the right breast. Both of the breast reductions were then performed in the following similar manner.  The nipple-areolar complex was marked with a 45-mm nipple marker.  The skin was then incised and deepithelialized around the nipple-areolar complex down to the inframammary crease in the inferior pedicle pattern.  Next, the medial, superior, and lateral skin flaps were elevated down to the chest wall.  Excess fat and glandular tissue removed from the inferior pedicle.  The  nipple-areolar complex was examined and found to be pink and viable.  The wound was irrigated with saline irrigation.  Meticulous hemostasis was obtained with the Bovie electrocautery.  Inferior pedicle was centralized using 3-0 Prolene suture.  A #10 JP flat fully fluted drain was placed into the wound. The skin flaps were brought together at the inverted T junction with a 2- 0 Prolene suture.  The incisions were stapled for temporary closure. The breasts compared and found to have good shape and symmetry.  The incisions were then closed from the medial aspect of the JP drain to the medial aspect of the [facility] incision by first placing a few 3-0 Monocryl sutures to tack together the dermal layer, and then both the dermal and cuticular layer were closed in a single layer using a 3-0 V-Lock barbed suture.  Lateral to the JP drain incision was closed using 3-0 Monocryl in the dermal layer, followed by 3-0 Monocryl running intracuticular stitch on the skin.  The vertical limb of the Wise pattern was closed in the dermal layer using 3-0 Monocryl suture.  The patient was placed in the upright position.  The future location of the nipple-areolar complexes was marked on both breast mounds using the 45-mm nipple marker.  She was then placed back in the recumbent position.  Both of the nipple areolar complexes were brought out onto the breast mounds in the following similar manner.  The skin was incised as marked and removed in full thickness into the subcutaneous tissues.  The nipple- areolar complex was examined, found to be pink and viable, then brought out through this aperture and sewn in place using 4-0 Monocryl in the dermal layer, followed by  5-0 Monocryl running intracuticular stitch on the skin.  This 5-0 Monocryl suture was then brought down to close the cuticular layer of the vertical limb as well.  The JP drain was sewn in place using 3-0 nylon suture.  The pectoralis major muscle and  fascia along with the breast and chest soft tissues were then infiltrated with 1% Exparel (total 266 mg).  Now the [facility] incision was also infiltrated with the Exparel in order to give the patient postoperative pain control.  The incisions were dressed with benzoin, Steri-Strips, and the nipples dressed with bacitracin ointment and Adaptic.  4x4s were placed over the incisions and ABD pads in the axillary areas.  The patient was placed into a light postoperative support bra.  There were no complications. The patient tolerated the procedure well.  The final needle, sponge counts were reported to be correct at the end of the case.  The patient was then recovered without complications.  Both the patient and her family were given proper postoperative wound care instructions. She was then discharged home in the care of her family in stable condition.  Follow up will be with me in a few days in the office.         Kaylee Montgomery, M.D.  08/21/2022 1:48 PM

## 2022-08-22 ENCOUNTER — Encounter (HOSPITAL_COMMUNITY): Payer: Self-pay | Admitting: General Surgery

## 2022-08-25 LAB — SURGICAL PATHOLOGY

## 2022-09-18 ENCOUNTER — Encounter (HOSPITAL_COMMUNITY): Payer: Self-pay

## 2022-11-04 ENCOUNTER — Encounter: Payer: BC Managed Care – PPO | Admitting: Family Medicine

## 2022-11-21 ENCOUNTER — Other Ambulatory Visit: Payer: Self-pay | Admitting: Family Medicine

## 2022-11-21 DIAGNOSIS — E559 Vitamin D deficiency, unspecified: Secondary | ICD-10-CM

## 2022-12-11 ENCOUNTER — Ambulatory Visit (INDEPENDENT_AMBULATORY_CARE_PROVIDER_SITE_OTHER): Payer: BC Managed Care – PPO | Admitting: Family Medicine

## 2022-12-11 ENCOUNTER — Encounter: Payer: Self-pay | Admitting: Family Medicine

## 2022-12-11 VITALS — BP 121/79 | HR 96 | Ht 64.0 in | Wt 197.1 lb

## 2022-12-11 DIAGNOSIS — E559 Vitamin D deficiency, unspecified: Secondary | ICD-10-CM | POA: Insufficient documentation

## 2022-12-11 DIAGNOSIS — E1165 Type 2 diabetes mellitus with hyperglycemia: Secondary | ICD-10-CM | POA: Diagnosis not present

## 2022-12-11 DIAGNOSIS — E782 Mixed hyperlipidemia: Secondary | ICD-10-CM

## 2022-12-11 DIAGNOSIS — N6092 Unspecified benign mammary dysplasia of left breast: Secondary | ICD-10-CM

## 2022-12-11 DIAGNOSIS — Z0000 Encounter for general adult medical examination without abnormal findings: Secondary | ICD-10-CM | POA: Diagnosis not present

## 2022-12-11 DIAGNOSIS — I10 Essential (primary) hypertension: Secondary | ICD-10-CM

## 2022-12-11 DIAGNOSIS — Z794 Long term (current) use of insulin: Secondary | ICD-10-CM

## 2022-12-11 DIAGNOSIS — E1159 Type 2 diabetes mellitus with other circulatory complications: Secondary | ICD-10-CM

## 2022-12-11 NOTE — Assessment & Plan Note (Signed)
Updated lab needed at/ before next visit.   

## 2022-12-11 NOTE — Progress Notes (Signed)
    Kaylee Montgomery     MRN: [id number]      DOB: 1980-11-25  HPI: Patient is in for annual physical exam. No other health concerns are expressed or addressed at the visit. Recent labs,  are reviewed. Immunization is reviewed , and  does not want covid vaccine PE: BP 121/79 (BP Location: Right Arm, Patient Position: Sitting, Cuff Size: Large)   Pulse 96   Ht 5' 4"$  (1.626 m)   Wt 197 lb 1.9 oz (89.4 kg)   SpO2 97%   BMI 33.84 kg/m   Pleasant  female, alert and oriented x 3, in no cardio-pulmonary distress. Afebrile. HEENT No facial trauma or asymetry. Sinuses non tender.  Extra occullar muscles intact.. External ears normal, . Neck: supple,   Chest: Clear to ascultation bilaterally.No crackles or wheezes. Non tender to palpation    Cardiovascular system; Heart sounds normal,  S1 and  S2 ,no S3.  No murmur, or thrill.  Peripheral pulses normal.  Abdomen: Soft, non tender,   GU: Not examined  Musculoskeletal exam: Full ROM of spine, hips , shoulders and knees. No deformity ,swelling or crepitus noted. No muscle wasting or atrophy.   Neurologic: Cranial nerves 2 to 12 intact. Power, tone ,sensation and reflexes normal throughout. No disturbance in gait. No tremor.  Skin: Intact, no ulceration, erythema , scaling or rash noted. Pigmentation normal throughout  Psych; Normal mood and affect. Judgement and concentration normal   Assessment & Plan:  Encounter for annual physical exam Annual exam as documented. Counseling done  re healthy lifestyle involving commitment to 150 minutes exercise per week, heart healthy diet, and attaining healthy weight.The importance of adequate sleep also discussed. Regular seat belt use and home safety, is also discussed. Changes in health habits are decided on by the patient with goals and time frames  set for achieving them. Immunization and cancer screening needs are specifically addressed at this visit.   Vitamin D  deficiency Updated lab needed at/ before next visit.

## 2022-12-11 NOTE — Patient Instructions (Signed)
F/u in 6 months, call if you need me sooner  Keep up great health habits and continue to improve on them  Nurse pls get eye exam from Dr Jorja Loa 05/2022 approx  You are  being referred back to Oncology  Fasting cBC, lipid, cmp and EGFR, microalb, TSH and vit d 5 to 7 days before follow up  Weight loss goal of 12 pounds  Thanks for choosing [facility], we consider it a privelige to serve you.

## 2022-12-11 NOTE — Assessment & Plan Note (Signed)

## 2022-12-22 ENCOUNTER — Other Ambulatory Visit: Payer: Self-pay | Admitting: Family Medicine

## 2023-01-27 ENCOUNTER — Other Ambulatory Visit: Payer: Self-pay | Admitting: Family Medicine

## 2023-03-17 ENCOUNTER — Other Ambulatory Visit: Payer: Self-pay | Admitting: Family Medicine

## 2023-04-15 ENCOUNTER — Other Ambulatory Visit: Payer: Self-pay | Admitting: Family Medicine

## 2023-05-05 ENCOUNTER — Other Ambulatory Visit: Payer: Self-pay | Admitting: Family Medicine

## 2023-06-10 ENCOUNTER — Ambulatory Visit: Payer: BC Managed Care – PPO | Admitting: Family Medicine

## 2023-06-10 ENCOUNTER — Encounter: Payer: Self-pay | Admitting: Family Medicine

## 2023-06-10 VITALS — BP 126/85 | HR 93 | Ht 64.0 in | Wt 198.0 lb

## 2023-06-10 DIAGNOSIS — N6092 Unspecified benign mammary dysplasia of left breast: Secondary | ICD-10-CM

## 2023-06-10 DIAGNOSIS — Z794 Long term (current) use of insulin: Secondary | ICD-10-CM

## 2023-06-10 DIAGNOSIS — E669 Obesity, unspecified: Secondary | ICD-10-CM | POA: Diagnosis not present

## 2023-06-10 DIAGNOSIS — R7989 Other specified abnormal findings of blood chemistry: Secondary | ICD-10-CM

## 2023-06-10 DIAGNOSIS — E782 Mixed hyperlipidemia: Secondary | ICD-10-CM | POA: Diagnosis not present

## 2023-06-10 DIAGNOSIS — I10 Essential (primary) hypertension: Secondary | ICD-10-CM

## 2023-06-10 DIAGNOSIS — E1165 Type 2 diabetes mellitus with hyperglycemia: Secondary | ICD-10-CM

## 2023-06-10 DIAGNOSIS — Z1231 Encounter for screening mammogram for malignant neoplasm of breast: Secondary | ICD-10-CM

## 2023-06-10 DIAGNOSIS — E1159 Type 2 diabetes mellitus with other circulatory complications: Secondary | ICD-10-CM

## 2023-06-10 NOTE — Patient Instructions (Addendum)
F/U with pap in October/ November  Nurse pls send  for diabetic eye exam in this month , [address] 10/25/1094  Please schedule mammogram at checkout   Urine aCR  today  Fasting labs already ordered in next 1 week  Pls reach out for appt with oncology at [facility] if you do not hear from them in next 1 week    Please schedule mammogram [facility], when due at checkout ( may need tpo call you back on that, Nurse ps check due date before scheduling)  It is important that you exercise regularly at least 30 minutes 5 times a week. If you develop chest pain, have severe difficulty breathing, or feel very tired, stop exercising immediately and seek medical attention    Thanks for choosing [facility], we consider it a privelige to serve you.

## 2023-06-11 ENCOUNTER — Other Ambulatory Visit: Payer: Self-pay | Admitting: Family Medicine

## 2023-06-11 DIAGNOSIS — E559 Vitamin D deficiency, unspecified: Secondary | ICD-10-CM

## 2023-06-12 LAB — MICROALBUMIN / CREATININE URINE RATIO
Creatinine, Urine: 52 mg/dL
Microalb/Creat Ratio: 14 mg/g{creat} (ref 0–29)
Microalbumin, Urine: 7.1 ug/mL

## 2023-06-13 ENCOUNTER — Encounter: Payer: Self-pay | Admitting: Family Medicine

## 2023-06-13 NOTE — Assessment & Plan Note (Signed)
  Patient re-educated about  the importance of commitment to a  minimum of 150 minutes of exercise per week as able.  The importance of healthy food choices with portion control discussed, as well as eating regularly and within a 12 hour window most days. The need to choose "clean , green" food 50 to 75% of the time is discussed, as well as to make water the primary drink and set a goal of 64 ounces water daily.       06/10/2023    2:51 PM 12/11/2022    3:07 PM 08/21/2022    5:54 AM  Weight /BMI  Weight 198 lb 197 lb 1.9 oz 199 lb  Height 5\' 4"  (1.626 m) 5\' 4"  (1.626 m) 5\' 4"  (1.626 m)  BMI 33.99 kg/m2 33.84 kg/m2 34.16 kg/m2    Unchanged, needs to work more conisitently on weigh loss

## 2023-06-13 NOTE — Assessment & Plan Note (Signed)
Currently uncontrolled, managed by Endo Kaylee Montgomery is reminded of the importance of commitment to daily physical activity for 30 minutes or more, as able and the need to limit carbohydrate intake to 30 to 60 grams per meal to help with blood sugar control.   The need to take medication as prescribed, test blood sugar as directed, and to call between visits if there is a concern that blood sugar is uncontrolled is also discussed.   Kaylee Montgomery is reminded of the importance of daily foot exam, annual eye examination, and good blood sugar, blood pressure and cholesterol control.     Latest Ref Rng & Units 06/10/2023    4:07 PM 08/15/2022    1:25 PM 07/02/2022    1:36 PM 03/19/2022   11:45 AM 02/25/2022    4:29 PM  Diabetic Labs  HbA1c 4.8 - 5.6 %  7.4      Micro/Creat Ratio 0 - 29 mg/g creat 14     12   Chol 100 - 199 mg/dL   469     HDL >62 mg/dL   34     Calc LDL 0 - 99 mg/dL   54     Triglycerides 0 - 149 mg/dL   76     Creatinine 9.52 - 1.00 mg/dL  8.41  3.24  4.01        06/10/2023    2:51 PM 12/11/2022    3:07 PM 08/21/2022    4:15 PM 08/21/2022    4:00 PM 08/21/2022    3:45 PM 08/21/2022    3:30 PM 08/21/2022    3:15 PM  BP/Weight  Systolic BP 126 121 110 105 105 103 117  Diastolic BP 85 79 79 75 72 73 75  Wt. (Lbs) 198 197.12       BMI 33.99 kg/m2 33.84 kg/m2           12/11/2022    3:00 PM 07/05/2020    1:00 PM  Foot/eye exam completion dates  Foot Form Completion Done Done

## 2023-06-13 NOTE — Progress Notes (Signed)
Kaylee Montgomery     MRN: [id number]      DOB: 12-25-1980  Chief Complaint  Patient presents with   Follow-up    Follow up no concerns    HPI Ms. Leibrock is here for follow up and re-evaluation of chronic medical conditions, medication management and review of any available recent lab and radiology data.  Preventive health is updated, specifically  Cancer screening and Immunization.   Questions or concerns regarding consultations or procedures which the PT has had in the interim are  addressed. The PT denies any adverse reactions to current medications since the last visit.  There are no new concerns.  There are no specific complaints   ROS Denies recent fever or chills. Denies sinus pressure, nasal congestion, ear pain or sore throat. Denies chest congestion, productive cough or wheezing. Denies chest pains, palpitations and leg swelling Denies abdominal pain, nausea, vomiting,diarrhea or constipation.   Denies dysuria, frequency, hesitancy or incontinence. Denies joint pain, swelling and limitation in mobility. Denies headaches, seizures, numbness, or tingling. Denies depression, anxiety or insomnia. Denies skin break down or rash.   PE  BP 126/85 (BP Location: Right Arm, Patient Position: Sitting, Cuff Size: Large)   Pulse 93   Ht 5\' 4"  (1.626 m)   Wt 198 lb (89.8 kg)   SpO2 97%   BMI 33.99 kg/m   Patient alert and oriented and in no cardiopulmonary distress.  HEENT: No facial asymmetry, EOMI,     Neck supple .  Chest: Clear to auscultation bilaterally.  CVS: S1, S2 no murmurs, no S3.Regular rate.  ABD: Soft non tender.   Ext: No edema  MS: Adequate ROM spine, shoulders, hips and knees.  Skin: Intact, no ulcerations or rash noted.  Psych: Good eye contact, normal affect. Memory intact not anxious or depressed appearing.  CNS: CN 2-12 intact, power,  normal throughout.no focal deficits noted.   Assessment & Plan  Atypical ductal hyperplasia of left  breast Needs to schedule and  keep appt with oncology, I have discussed this in detail with pt, and she understands, she will call about appt next week if she has not heard from the dept  Controlled type 2 diabetes mellitus with hyperglycemia (HCC) Currently uncontrolled, managed by Endo Ms. Kassem is reminded of the importance of commitment to daily physical activity for 30 minutes or more, as able and the need to limit carbohydrate intake to 30 to 60 grams per meal to help with blood sugar control.   The need to take medication as prescribed, test blood sugar as directed, and to call between visits if there is a concern that blood sugar is uncontrolled is also discussed.   Ms. Field is reminded of the importance of daily foot exam, annual eye examination, and good blood sugar, blood pressure and cholesterol control.     Latest Ref Rng & Units 06/10/2023    4:07 PM 08/15/2022    1:25 PM 07/02/2022    1:36 PM 03/19/2022   11:45 AM 02/25/2022    4:29 PM  Diabetic Labs  HbA1c 4.8 - 5.6 %  7.4      Micro/Creat Ratio 0 - 29 mg/g creat 14     12   Chol 100 - 199 mg/dL   528     HDL >41 mg/dL   34     Calc LDL 0 - 99 mg/dL   54     Triglycerides 0 - 149 mg/dL   76  Creatinine 0.44 - 1.00 mg/dL  4.09  8.11  9.14        06/10/2023    2:51 PM 12/11/2022    3:07 PM 08/21/2022    4:15 PM 08/21/2022    4:00 PM 08/21/2022    3:45 PM 08/21/2022    3:30 PM 08/21/2022    3:15 PM  BP/Weight  Systolic BP 126 121 110 105 105 103 117  Diastolic BP 85 79 79 75 72 73 75  Wt. (Lbs) 198 197.12       BMI 33.99 kg/m2 33.84 kg/m2           12/11/2022    3:00 PM 07/05/2020    1:00 PM  Foot/eye exam completion dates  Foot Form Completion Done Done        HYPERPROLACTINEMIA Recent increase in level in 04/2023, being managed by Endo, has November follow up  Essential hypertension, benign DASH diet and commitment to daily physical activity for a minimum of 30 minutes discussed and encouraged, as a  part of hypertension management. The importance of attaining a healthy weight is also discussed.     06/10/2023    2:51 PM 12/11/2022    3:07 PM 08/21/2022    4:15 PM 08/21/2022    4:00 PM 08/21/2022    3:45 PM 08/21/2022    3:30 PM 08/21/2022    3:15 PM  BP/Weight  Systolic BP 126 121 110 105 105 103 117  Diastolic BP 85 79 79 75 72 73 75  Wt. (Lbs) 198 197.12       BMI 33.99 kg/m2 33.84 kg/m2          Not at goal, may add low dose ARB/ACE  at next visit, no change in medication   Mixed hyperlipidemia Hyperlipidemia:Low fat diet discussed and encouraged.   Lipid Panel  Lab Results  Component Value Date   CHOL 104 07/02/2022   HDL 34 (L) 07/02/2022   LDLCALC 54 07/02/2022   TRIG 76 07/02/2022   CHOLHDL 3.1 07/02/2022     Updated lab needed at/ before next visit.   Low serum vitamin D Updated lab needed at/ before next visit.   Obesity (BMI 30.0-34.9)  Patient re-educated about  the importance of commitment to a  minimum of 150 minutes of exercise per week as able.  The importance of healthy food choices with portion control discussed, as well as eating regularly and within a 12 hour window most days. The need to choose "clean , green" food 50 to 75% of the time is discussed, as well as to make water the primary drink and set a goal of 64 ounces water daily.       06/10/2023    2:51 PM 12/11/2022    3:07 PM 08/21/2022    5:54 AM  Weight /BMI  Weight 198 lb 197 lb 1.9 oz 199 lb  Height 5\' 4"  (1.626 m) 5\' 4"  (1.626 m) 5\' 4"  (1.626 m)  BMI 33.99 kg/m2 33.84 kg/m2 34.16 kg/m2    Unchanged, needs to work more conisitently on weigh loss

## 2023-06-13 NOTE — Assessment & Plan Note (Signed)
Updated lab needed at/ before next visit.   

## 2023-06-13 NOTE — Assessment & Plan Note (Signed)
Needs to schedule and  keep appt with oncology, I have discussed this in detail with pt, and she understands, she will call about appt next week if she has not heard from the dept

## 2023-06-13 NOTE — Assessment & Plan Note (Signed)
Hyperlipidemia:Low fat diet discussed and encouraged.   Lipid Panel  Lab Results  Component Value Date   CHOL 104 07/02/2022   HDL 34 (L) 07/02/2022   LDLCALC 54 07/02/2022   TRIG 76 07/02/2022   CHOLHDL 3.1 07/02/2022     Updated lab needed at/ before next visit.

## 2023-06-13 NOTE — Assessment & Plan Note (Signed)
DASH diet and commitment to daily physical activity for a minimum of 30 minutes discussed and encouraged, as a part of hypertension management. The importance of attaining a healthy weight is also discussed.     06/10/2023    2:51 PM 12/11/2022    3:07 PM 08/21/2022    4:15 PM 08/21/2022    4:00 PM 08/21/2022    3:45 PM 08/21/2022    3:30 PM 08/21/2022    3:15 PM  BP/Weight  Systolic BP 126 121 110 105 105 103 117  Diastolic BP 85 79 79 75 72 73 75  Wt. (Lbs) 198 197.12       BMI 33.99 kg/m2 33.84 kg/m2          Not at goal, may add low dose ARB/ACE  at next visit, no change in medication

## 2023-06-13 NOTE — Assessment & Plan Note (Signed)
Recent increase in level in 04/2023, being managed by Endo, has November follow up

## 2023-06-15 ENCOUNTER — Telehealth: Payer: Self-pay | Admitting: Family Medicine

## 2023-06-15 NOTE — Telephone Encounter (Signed)
Per breast center they will be sending back mammogram order to sign off by provider.

## 2023-06-16 ENCOUNTER — Telehealth: Payer: Self-pay | Admitting: Family Medicine

## 2023-06-16 ENCOUNTER — Other Ambulatory Visit: Payer: Self-pay

## 2023-06-16 DIAGNOSIS — E559 Vitamin D deficiency, unspecified: Secondary | ICD-10-CM

## 2023-06-16 MED ORDER — VITAMIN D (ERGOCALCIFEROL) 1.25 MG (50000 UNIT) PO CAPS: ORAL_CAPSULE | ORAL | 1 refills | Status: DC

## 2023-06-16 NOTE — Telephone Encounter (Signed)
Refill sent.

## 2023-06-16 NOTE — Telephone Encounter (Signed)
noted 

## 2023-06-16 NOTE — Telephone Encounter (Signed)
Prescription Request  06/16/2023  LOV: 06/10/2023  What is the name of the medication or equipment? Vitamin D, Ergocalciferol, (DRISDOL) 1.25 MG (50000 UNIT) CAPS capsule [[id number]]    Have you contacted your pharmacy to request a refill? Yes   Which pharmacy would you like this sent to?   [id number] - [place], [place] - [address] [place] [address] Phone: [phone] Fax: [phone]      Patient notified that their request is being sent to the clinical staff for review and that they should receive a response within 2 business days.   Please advise at Mobile [phone] (mobile)

## 2023-07-21 ENCOUNTER — Ambulatory Visit
Admission: RE | Admit: 2023-07-21 | Discharge: 2023-07-21 | Disposition: A | Discharge status: Home / Self Care | Payer: BC Managed Care – PPO | Source: Ambulatory Visit | Attending: Family Medicine | Admitting: Family Medicine

## 2023-07-21 DIAGNOSIS — Z1231 Encounter for screening mammogram for malignant neoplasm of breast: Secondary | ICD-10-CM

## 2023-07-21 DIAGNOSIS — N6092 Unspecified benign mammary dysplasia of left breast: Secondary | ICD-10-CM

## 2023-07-23 ENCOUNTER — Inpatient Hospital Stay: Payer: BC Managed Care – PPO | Attending: Hematology and Oncology | Admitting: Hematology and Oncology

## 2023-07-23 VITALS — BP 128/82 | HR 85 | Temp 97.4°F | Resp 18 | Ht 64.0 in | Wt 197.9 lb

## 2023-07-23 DIAGNOSIS — R921 Mammographic calcification found on diagnostic imaging of breast: Secondary | ICD-10-CM | POA: Diagnosis not present

## 2023-07-23 DIAGNOSIS — Z79899 Other long term (current) drug therapy: Secondary | ICD-10-CM | POA: Insufficient documentation

## 2023-07-23 DIAGNOSIS — N6092 Unspecified benign mammary dysplasia of left breast: Secondary | ICD-10-CM | POA: Diagnosis present

## 2023-07-23 DIAGNOSIS — G43909 Migraine, unspecified, not intractable, without status migrainosus: Secondary | ICD-10-CM | POA: Insufficient documentation

## 2023-07-23 DIAGNOSIS — Z7984 Long term (current) use of oral hypoglycemic drugs: Secondary | ICD-10-CM | POA: Insufficient documentation

## 2023-07-23 DIAGNOSIS — Z794 Long term (current) use of insulin: Secondary | ICD-10-CM | POA: Diagnosis not present

## 2023-07-23 DIAGNOSIS — I10 Essential (primary) hypertension: Secondary | ICD-10-CM | POA: Insufficient documentation

## 2023-07-23 DIAGNOSIS — E119 Type 2 diabetes mellitus without complications: Secondary | ICD-10-CM | POA: Diagnosis not present

## 2023-07-23 DIAGNOSIS — Z86018 Personal history of other benign neoplasm: Secondary | ICD-10-CM | POA: Insufficient documentation

## 2023-07-23 MED ORDER — TAMOXIFEN CITRATE 10 MG PO TABS: 5.0000 mg | ORAL_TABLET | Freq: Every day | ORAL | 1 refills | Status: DC

## 2023-07-23 NOTE — Progress Notes (Signed)
[facility] CONSULT NOTE  Patient Care Team: Kerri Perches, MD as PCP - General Fields, Darleene Cleaver, MD (Inactive) as Consulting Physician (Gastroenterology) Janalyn Harder, MD (Inactive) as Consulting Physician (Dermatology)  CHIEF COMPLAINTS/PURPOSE OF CONSULTATION:   High risk for breast cancer.  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  During her initial visit we have discussed about tamoxifen.  She took tamoxifen for a few months and then ran out.  She has not seen as in over a year.  Atypical Ductal Hyperplasia History of atypical ductal hyperplasia with benign breast biopsies. Discussed the benefits of tamoxifen for risk reduction of breast cancer, including new data on low-dose tamoxifen. No significant side effects reported from previous tamoxifen use. -Start low-dose Tamoxifen 5mg  daily (by cutting 10mg  tablets in half).  Discussed adverse effects of tamoxifen including but not limited to risk of DVT/PE, endometrial hyperplasia and rarely endometrial malignancy, benefit on bone density. -Annual follow-up appointment for prescription renewal and assessment. -Recent mammogram with no evidence of malignancy.  Mammogram due again in October 2025.  We have briefly reviewed the role of MRIs and high risk breast cancer, there is no definite data supporting MRI and patients with atypical ductal hyperplasia although this can be considered. -Perform monthly self-breast exams and report any changes. -Ensure breast exam by another provider in approximately 6 months.  Migraines Managed by a neurologist, well-controlled. -Continue current management.  General Health Maintenance -Annual mammogram (last one on 07/21/2023 showed no concerning findings).  Thank you for consulting [place] the care of this patient.  Please  not hesitate to contact us with any additional questions or concerns.  HISTORY OF PRESENTING ILLNESS:  Kaylee Montgomery 42 y.o. female is here because of ADH  Chronology  Mammogram April 2022 which showed possible mass and 2 groups of calcifications in the left breast. Further diagnostic mammo showed multiple groups of suspicious calcifications in the lateral to lower left breast spanning approximately 8 cm.  1 of these groups of calcifications is associated with the mass which does not have a sonographic correlate.  No evidence of left axillary lymphadenopathy. Biopsy done in May 2022 from the left lower outer quadrant showed atypical lobular hyperplasia second biopsy from the left lower outer mid showed atypical ductal hyperplasia. She underwent left breast lumpectomy with radioactive seed localization x2 on August 1 by Dr. Carolynne Edouard This showed focal atypical ductal hyperplasia from the left inferior lateral breast.  Left superior lateral breast excision showed focal atypical lobular hyperplasia.  There is intraductal papilloma with calcifications noted in the left deep margin of the superior lateral breast.  No evidence of malignancy. She is referred to high risk breast clinic given her atypical ductal hyperplasia and for further recommendations.  Age at menarche :43 First child birth at birth:15 Use of OCP: 15 yrs No breast cancer in the family No ovarian cancer in the family.  She has DM, HTN at baseline, well controlled according to the patient.  Interval History  She is here for a follow up about using tamoxifen for endocrine prevention.  The patient, with a history of atypical ductal hyperplasia, presents for a follow-up after breast reduction surgery. She reports that the surgery went well and that all tissue removed was benign. She recently had a mammogram, which showed no concerning findings. The patient was previously on tamoxifen therapy to reduce  the risk of breast cancer due to  her atypical ductal hyperplasia. However, she stopped taking the medication after running out and not having a follow-up appointment to renew the prescription. She reports no side effects from the tamoxifen therapy. The patient also sees a neurologist for well-controlled migraines. Rest of the pertinent 10 point ROS reviewed and negative.  MEDICAL HISTORY:  Past Medical History:  Diagnosis Date   Anxiety    Breast mass 02/2017   Complication of anesthesia    Dental crown present    Diabetes mellitus without complication (HCC)    Hypertension    states BP fluctuates; has been on med. x 1 yr.   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    SURGICAL HISTORY: Past Surgical History:  Procedure Laterality Date   BALLOON DILATION N/A 01/14/2022   Procedure: BALLOON DILATION;  Surgeon: Lanelle Bal, DO;  Location: [facility];  Service: Endoscopy;  Laterality: N/A;   BIOPSY  01/14/2022   Procedure: BIOPSY;  Surgeon: Lanelle Bal, DO;  Location: [facility];  Service: Endoscopy;;   BREAST EXCISIONAL BIOPSY Left 2018   Negative   BREAST LUMPECTOMY WITH RADIOACTIVE SEED AND SENTINEL LYMPH NODE BIOPSY Right 08/21/2022   Procedure: RIGHT BREAST LUMPECTOMY WITH RADIOACTIVE SEED X1;  Surgeon: Griselda Miner, MD;  Location: [facility] OR;  Service: General;  Laterality: Right;   BREAST LUMPECTOMY WITH RADIOACTIVE SEED LOCALIZATION Left 03/05/2017   Procedure: LEFT BREAST LUMPECTOMY WITH RADIOACTIVE SEED LOCALIZATION;  Surgeon: Manus Rudd, MD;  Location: [facility];  Service: General;  Laterality: Left;   BREAST LUMPECTOMY WITH RADIOACTIVE SEED LOCALIZATION Left 05/20/2021   Procedure: LEFT BREAST LUMPECTOMY WITH RADIOACTIVE SEED LOCALIZATION X2;  Surgeon: Griselda Miner, MD;  Location:  SURGERY CENTER;   Service: General;  Laterality: Left;   BREAST LUMPECTOMY WITH RADIOACTIVE SEED LOCALIZATION Left 08/21/2022   Procedure: LEFT BREAST LUMPECTOMY WITH RADIOACTIVE SEED LOCALIZATION SEEDS X2;  Surgeon: Griselda Miner, MD;  Location: [facility] OR;  Service: General;  Laterality: Left;   BREAST REDUCTION SURGERY Bilateral 08/21/2022   Procedure: BILATERAL MAMMOPLASTY REDUCTION  (BREAST);  Surgeon: Contogiannis, Chales Abrahams, MD;  Location: [facility] OR;  Service: Plastics;  Laterality: Bilateral;   CESAREAN SECTION     CHOLECYSTECTOMY  08/2017   [facility]   ESOPHAGOGASTRODUODENOSCOPY  07/21/2017   [facility]: Dr. Boyd Kerbs. healthy appearing cyst cavity without necrotic material that was decreased in size. Removal of stent scheduled for 11/12.    ESOPHAGOGASTRODUODENOSCOPY (EGD) WITH PROPOFOL N/A 01/14/2022   Procedure: ESOPHAGOGASTRODUODENOSCOPY (EGD) WITH PROPOFOL;  Surgeon: Lanelle Bal, DO;  Location: [facility];  Service: Endoscopy;  Laterality: N/A;  2:15 ASA 2.   EUS  06/19/2017   [facility]: 7 x 7.3 cm cyst with necrotic material noted adjacent to the body of the pancreas. FNA without malignancy.   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)   REDUCTION MAMMAPLASTY Bilateral 08/21/2022   TONSILLECTOMY  age 68    SOCIAL HISTORY: Social History   Socioeconomic History  Marital status: Single    Spouse name: Not on file   Number of children: 1   Years of education: Not on file   Highest education level: Not on file  Occupational History   Occupation: STUDENT    Occupation: CASEWORKER    Employer: [employer]  Tobacco Use   Smoking status: Never   Smokeless tobacco: Never  Vaping Use   Vaping status: Never Used  Substance  and Sexual Activity   Alcohol use: No    Alcohol/week: 0.0 standard drinks of alcohol    Comment: never drank regularly or heavily but NO etoh at all since 01/2017   Drug use: No   Sexual activity: Yes    Birth control/protection: None  Other Topics Concern   Not on file  Social History Narrative   Patient is right handed.   Patient drinks 1-2 cups of caffeine daily.   Social Determinants of Health   Financial Resource Strain: Not on file  Food Insecurity: Not on file  Transportation Needs: Not on file  Physical Activity: Not on file  Stress: Not on file  Social Connections: Not on file  Intimate Partner Violence: Not on file    FAMILY HISTORY: Family History  Problem Relation Age of Onset   Heart disease Father        stent   Migraines Father    Migraines Son    Diabetes Maternal Grandfather    Colon cancer Neg Hx     ALLERGIES:  is allergic to bromocriptine mesylate, depo-medrol [methylprednisolone sodium succ], dilaudid [hydromorphone hcl], and imitrex [sumatriptan].  MEDICATIONS:  Current Outpatient Medications  Medication Sig Dispense Refill   acidophilus (RISAQUAD) CAPS capsule Take 1 capsule by mouth daily.     amLODipine (NORVASC) 10 MG tablet TAKE 1 TABLET(10 MG) BY MOUTH DAILY 90 tablet 3   ascorbic acid (VITAMIN C) 500 MG tablet Take 500 mg by mouth 2 (two) times daily.     atorvastatin (LIPITOR) 20 MG tablet Take 1 tablet (20 mg total) by mouth daily. 90 tablet 3   BD PEN NEEDLE NANO 2ND GEN 32G X 4 MM MISC Inject into the skin daily.     cyclobenzaprine (FLEXERIL) 10 MG tablet TAKE 1 TABLET(10 MG) BY MOUTH AT BEDTIME 90 tablet 3   Empagliflozin-metFORMIN HCl ER 25-1000 MG TB24 Take 1 tablet by mouth daily.     gabapentin (NEURONTIN) 300 MG capsule TAKE 1 CAPSULE(300 MG) BY MOUTH AT BEDTIME 90 capsule 3   glucose blood (ONETOUCH VERIO) test strip Use as instructed bid. E11.65 100 each 5   insulin degludec (TRESIBA) 100 UNIT/ML FlexTouch Pen Inject 16 Units  into the skin at bedtime.     Insulin Pen Needle (BD PEN NEEDLE NANO 2ND GEN) 32G X 4 MM MISC See admin instructions.     JANUVIA 100 MG tablet Take 100 mg by mouth daily.     Lancets MISC 1 each by Does not apply route 2 (two) times daily. 100 each 5   Metoprolol Tartrate 75 MG TABS TAKE 1 TABLET BY MOUTH EVERY DAY 90 tablet 3   Multiple Vitamin (MULTIVITAMIN WITH MINERALS) TABS tablet Take 1 tablet by mouth in the morning and at bedtime.     norethindrone (MICRONOR) 0.35 MG tablet TAKE 1 TABLET(0.35 MG) BY MOUTH DAILY 28 tablet 5   repaglinide (PRANDIN) 0.5 MG tablet Take 0.5 mg by mouth daily as needed (high glucose meals).     tamoxifen (NOLVADEX) 20 MG tablet Take  20 mg by mouth daily. (Patient not taking: Reported on 06/10/2023)     Vitamin D, Ergocalciferol, (DRISDOL) 1.25 MG (50000 UNIT) CAPS capsule TAKE 1 CAPSULE BY MOUTH EVERY 7 DAYS 12 capsule 1   No current facility-administered medications for this visit.   PHYSICAL EXAMINATION: ECOG PERFORMANCE STATUS: 0 - Asymptomatic  There were no vitals taken for this visit.   Physical Exam Constitutional:      Appearance: Normal appearance.  HENT:     Head: Normocephalic and atraumatic.  Chest:  Breasts:    Left: No skin change.     Comments: Bilateral breast post breast reduction.  No palpable masses.  Surgical scarring noted.  No regional adenopathy. Musculoskeletal:        General: No swelling or tenderness.     Cervical back: Normal range of motion and neck supple. No rigidity.  Lymphadenopathy:     Cervical: No cervical adenopathy.  Neurological:     Mental Status: She is alert.  Psychiatric:        Mood and Affect: Mood normal.     LABORATORY DATA:  I have reviewed the data as listed Lab Results  Component Value Date   WBC 5.1 08/15/2022   HGB 13.7 08/15/2022   HCT 42.7 08/15/2022   MCV 80.3 08/15/2022   PLT 298 08/15/2022     Chemistry      Component Value Date/Time   NA 140 08/15/2022 1325   NA 145 (H)  07/02/2022 1336   K 3.4 (L) 08/15/2022 1325   CL 110 08/15/2022 1325   CO2 24 08/15/2022 1325   BUN 6 08/15/2022 1325   BUN 8 07/02/2022 1336   CREATININE 0.55 08/15/2022 1325   CREATININE 0.56 03/11/2019 1013      Component Value Date/Time   CALCIUM 9.3 08/15/2022 1325   ALKPHOS 39 (L) 07/02/2022 1336   AST 11 07/02/2022 1336   ALT 12 07/02/2022 1336   BILITOT 1.1 07/02/2022 1336       RADIOGRAPHIC STUDIES: I have personally reviewed the radiological images as listed and agreed with the findings in the report. MM 3D DIAGNOSTIC MAMMOGRAM BILATERAL BREAST  Result Date: 07/21/2023 CLINICAL DATA:  42 year old who underwent BILATERAL reduction mammoplasty in November, 2023. Personal history of excisional biopsy of atypical ductal hyperplasia and lobular neoplasia from the LEFT breast in August, 2022. Personal history of excisional biopsy BILATERAL papillomas in November, 2023. Personal history of benign core needle biopsy of the RIGHT breast in December, 2022, pathology PASH and periductal inflammation. EXAM: DIGITAL DIAGNOSTIC BILATERAL MAMMOGRAM WITH TOMOSYNTHESIS AND CAD TECHNIQUE: Bilateral digital diagnostic mammography and breast tomosynthesis was performed. The images were evaluated with computer-aided detection. COMPARISON:  Previous exam(s). ACR Breast Density Category c: The breasts are heterogeneously dense, which may obscure small masses. FINDINGS: Full field CC and MLO views both breasts were obtained. RIGHT: No findings suspicious for malignancy. Expected post surgical changes related to the reduction mammoplasty. Hourglass shaped tissue marking clip in the UPPER OUTER QUADRANT at middle depth at the site of the prior benign biopsy (PASH and periductal inflammation). LEFT: No findings suspicious for malignancy. Expected post surgical changes related to the reduction mammoplasty. IMPRESSION: 1. No mammographic evidence of malignancy involving either breast. 2. Expected post surgical  changes related to the prior reduction mammoplasty. RECOMMENDATION: 1.  Screening mammogram in one year.(Code:SM-B-01Y) 2. Annual supplemental high-risk screening MRI if the patient's estimated lifetime risk of breast cancer is still greater than 20%. I have discussed the findings and recommendations  with the patient. If applicable, a reminder letter will be sent to the patient regarding the next appointment. BI-RADS CATEGORY  2: Benign. Electronically Signed   By: Hulan Saas M.D.   On: 07/21/2023 12:27     SURGICAL PATHOLOGY  CASE: [id number]  PATIENT: [facility]  Surgical Pathology Report    Clinical History: Left breast atypical ductal hyperplasia (nt)   FINAL MICROSCOPIC DIAGNOSIS:   A. BREAST, LEFT INFERIOR-LATERAL, EXCISION:  - Focal atypical ductal hyperplasia.  - Biopsy site.   B. BREAST, LEFT SUPERIOR-LATERAL, EXCISION:  - Focal atypical lobular hyperplasia.  - Biopsy site.   C. BREAST, LEFT DEEP MARGIN OF SUPERIOR-LATERAL, EXCISION:  - Intraductal papilloma with calcifications.  - Fibrocystic change and columnar cell hyperplasia.  - Biopsy site.   All questions were answered. The patient knows to call the clinic with any problems, questions or concerns.      Rachel Moulds, MD 07/23/2023 9:14 AM

## 2023-08-25 ENCOUNTER — Ambulatory Visit: Payer: BC Managed Care – PPO | Admitting: Family Medicine

## 2023-09-16 ENCOUNTER — Ambulatory Visit: Payer: BC Managed Care – PPO | Admitting: Family Medicine

## 2023-10-16 ENCOUNTER — Ambulatory Visit: Payer: BC Managed Care – PPO | Admitting: Family Medicine

## 2023-10-16 ENCOUNTER — Encounter: Payer: Self-pay | Admitting: Family Medicine

## 2023-10-16 VITALS — BP 115/77 | HR 83 | Ht 64.0 in | Wt 201.1 lb

## 2023-10-16 DIAGNOSIS — E66811 Obesity, class 1: Secondary | ICD-10-CM | POA: Diagnosis not present

## 2023-10-16 DIAGNOSIS — G43019 Migraine without aura, intractable, without status migrainosus: Secondary | ICD-10-CM

## 2023-10-16 DIAGNOSIS — E559 Vitamin D deficiency, unspecified: Secondary | ICD-10-CM

## 2023-10-16 DIAGNOSIS — E782 Mixed hyperlipidemia: Secondary | ICD-10-CM

## 2023-10-16 DIAGNOSIS — E1169 Type 2 diabetes mellitus with other specified complication: Secondary | ICD-10-CM | POA: Diagnosis not present

## 2023-10-16 DIAGNOSIS — I10 Essential (primary) hypertension: Secondary | ICD-10-CM | POA: Diagnosis not present

## 2023-10-16 LAB — MICROALBUMIN / CREATININE URINE RATIO
Creatinine, Urine: 63.9 mg/dL
Microalb/Creat Ratio: 5 mg/g{creat} (ref 0–29)
Microalbumin, Urine: 3.3 ug/mL

## 2023-10-16 LAB — CBC
Hematocrit: 41.1 % (ref 34.0–46.6)
Hemoglobin: 13.1 g/dL (ref 11.1–15.9)
MCH: 25.1 pg — ABNORMAL LOW (ref 26.6–33.0)
MCHC: 31.9 g/dL (ref 31.5–35.7)
MCV: 79 fL (ref 79–97)
Platelets: 320 10*3/uL (ref 150–450)
RBC: 5.22 x10E6/uL (ref 3.77–5.28)
RDW: 12.7 % (ref 11.7–15.4)
WBC: 5.3 10*3/uL (ref 3.4–10.8)

## 2023-10-16 LAB — CMP14+EGFR
ALT: 11 [IU]/L (ref 0–32)
AST: 12 [IU]/L (ref 0–40)
Albumin: 4.4 g/dL (ref 3.9–4.9)
Alkaline Phosphatase: 41 [IU]/L — ABNORMAL LOW (ref 44–121)
BUN/Creatinine Ratio: 18 (ref 9–23)
BUN: 10 mg/dL (ref 6–24)
Bilirubin Total: 0.8 mg/dL (ref 0.0–1.2)
CO2: 24 mmol/L (ref 20–29)
Calcium: 9.7 mg/dL (ref 8.7–10.2)
Chloride: 103 mmol/L (ref 96–106)
Creatinine, Ser: 0.57 mg/dL (ref 0.57–1.00)
Globulin, Total: 2.5 g/dL (ref 1.5–4.5)
Glucose: 109 mg/dL — ABNORMAL HIGH (ref 70–99)
Potassium: 3.8 mmol/L (ref 3.5–5.2)
Sodium: 140 mmol/L (ref 134–144)
Total Protein: 6.9 g/dL (ref 6.0–8.5)
eGFR: 115 mL/min/{1.73_m2} (ref >59)

## 2023-10-16 LAB — LIPID PANEL
Chol/HDL Ratio: 3 {ratio} (ref 0.0–4.4)
Cholesterol, Total: 120 mg/dL (ref 100–199)
HDL: 40 mg/dL (ref >39)
LDL Chol Calc (NIH): 60 mg/dL (ref 0–99)
Triglycerides: 109 mg/dL (ref 0–149)
VLDL Cholesterol Cal: 20 mg/dL (ref 5–40)

## 2023-10-16 LAB — VITAMIN D 25 HYDROXY (VIT D DEFICIENCY, FRACTURES): Vit D, 25-Hydroxy: 62.2 ng/mL (ref 30.0–100.0)

## 2023-10-16 LAB — TSH: TSH: 1.96 u[IU]/mL (ref 0.450–4.500)

## 2023-10-16 NOTE — Assessment & Plan Note (Addendum)
Diabetes associated with hypertension, hyperlipidemia, and obesity Followed by endo , need to send for updated hBA1C  Kaylee Montgomery is reminded of the importance of commitment to daily physical activity for 30 minutes or more, as able and the need to limit carbohydrate intake to 30 to 60 grams per meal to help with blood sugar control.   The need to take medication as prescribed, test blood sugar as directed, and to call between visits if there is a concern that blood sugar is uncontrolled is also discussed.   Kaylee Montgomery is reminded of the importance of daily foot exam, annual eye examination, and good blood sugar, blood pressure and cholesterol control.     Latest Ref Rng & Units 10/13/2023   12:30 PM 06/10/2023    4:07 PM 08/15/2022    1:25 PM 07/02/2022    1:36 PM 03/19/2022   11:45 AM  Diabetic Labs  HbA1c 4.8 - 5.6 %   7.4     Micro/Creat Ratio 0 - 29 mg/g creat 5  14      Chol 100 - 199 mg/dL 161    096    HDL >04 mg/dL 40    34    Calc LDL 0 - 99 mg/dL 60    54    Triglycerides 0 - 149 mg/dL 540    76    Creatinine 0.57 - 1.00 mg/dL 9.81   1.91  4.78  2.95       10/16/2023   11:00 AM 07/23/2023    9:21 AM 06/10/2023    2:51 PM 12/11/2022    3:07 PM 08/21/2022    4:15 PM 08/21/2022    4:00 PM 08/21/2022    3:45 PM  BP/Weight  Systolic BP 115 128 126 121 110 105 105  Diastolic BP 77 82 85 79 79 75 72  Wt. (Lbs) 201.12 197.9 198 197.12     BMI 34.52 kg/m2 33.97 kg/m2 33.99 kg/m2 33.84 kg/m2         12/11/2022    3:00 PM 07/05/2020    1:00 PM  Foot/eye exam completion dates  Foot Form Completion Done Done

## 2023-10-16 NOTE — Assessment & Plan Note (Signed)
Well controlled with prophylactic gabapentin

## 2023-10-16 NOTE — Progress Notes (Signed)
Kaylee Montgomery     MRN: [id number]      DOB: April 24, 1981  Chief Complaint  Patient presents with   Follow-up    Follow up    HPI Kaylee Montgomery is here for follow up and re-evaluation of chronic medical conditions, medication management and review of any available recent lab and radiology data.  Preventive health is updated, specifically  Cancer screening and Immunization.   Questions or concerns regarding consultations or procedures which the PT has had in the interim are  addressed. The PT denies any adverse reactions to current medications since the last visit.  States BS is fluctuating a lot esp since holiday season, not at goal C/o insufficient sleep stays upm later than necessary, avg 4 hrs/ night Needs to start aerobic exercise, doing pilates 3 days/ week  Wants and needs to lose weight   ROS Denies recent fever or chills. Denies sinus pressure, nasal congestion, ear pain or sore throat. Denies chest congestion, productive cough or wheezing. Denies chest pains, palpitations and leg swelling Denies abdominal pain, nausea, vomiting,diarrhea or constipation.   Denies dysuria, frequency, hesitancy or incontinence. Denies joint pain, swelling and limitation in mobility. Denies headaches, seizures, numbness, or tingling. Denies depression, anxiety or insomnia. Denies skin break down or rash.   PE  BP 115/77 (BP Location: Right Arm, Patient Position: Sitting, Cuff Size: Normal)   Pulse 83   Ht 5\' 4"  (1.626 m)   Wt 201 lb 1.9 oz (91.2 kg)   SpO2 94%   BMI 34.52 kg/m   Patient alert and oriented and in no cardiopulmonary distress.  HEENT: No facial asymmetry, EOMI,     Neck supple .  Chest: Clear to auscultation bilaterally.  CVS: S1, S2 no murmurs, no S3.Regular rate.  ABD: Soft non tender.   Ext: No edema  MS: Adequate ROM spine, shoulders, hips and knees.  Skin: Intact, no ulcerations or rash noted.  Psych: Good eye contact, normal affect. Memory intact not  anxious or depressed appearing.  CNS: CN 2-12 intact, power,  normal throughout.no focal deficits noted.   Assessment & Plan  Essential hypertension, benign Controlled, no change in medication DASH diet and commitment to daily physical activity for a minimum of 30 minutes discussed and encouraged, as a part of hypertension management. The importance of attaining a healthy weight is also discussed.     10/16/2023   11:00 AM 07/23/2023    9:21 AM 06/10/2023    2:51 PM 12/11/2022    3:07 PM 08/21/2022    4:15 PM 08/21/2022    4:00 PM 08/21/2022    3:45 PM  BP/Weight  Systolic BP 115 128 126 121 110 105 105  Diastolic BP 77 82 85 79 79 75 72  Wt. (Lbs) 201.12 197.9 198 197.12     BMI 34.52 kg/m2 33.97 kg/m2 33.99 kg/m2 33.84 kg/m2          Obesity (BMI 30.0-34.9)  Patient re-educated about  the importance of commitment to a  minimum of 150 minutes of exercise per week as able.  The importance of healthy food choices with portion control discussed, as well as eating regularly and within a 12 hour window most days. The need to choose "clean , green" food 50 to 75% of the time is discussed, as well as to make water the primary drink and set a goal of 64 ounces water daily.       10/16/2023   11:00 AM 07/23/2023    9:21 AM  06/10/2023    2:51 PM  Weight /BMI  Weight 201 lb 1.9 oz 197 lb 14.4 oz 198 lb  Height 5\' 4"  (1.626 m) 5\' 4"  (1.626 m) 5\' 4"  (1.626 m)  BMI 34.52 kg/m2 33.97 kg/m2 33.99 kg/m2    Needs to lower calorie intake for weight loss  Mixed hyperlipidemia Hyperlipidemia:Low fat diet discussed and encouraged.   Lipid Panel  Lab Results  Component Value Date   CHOL 120 10/13/2023   HDL 40 10/13/2023   LDLCALC 60 10/13/2023   TRIG 109 10/13/2023   CHOLHDL 3.0 10/13/2023     Controlled, no change in medication   Type 2 diabetes mellitus with other specified complication (HCC) Diabetes associated with hypertension, hyperlipidemia, and obesity Followed by  endo , need to send for updated hBA1C  Kaylee Montgomery is reminded of the importance of commitment to daily physical activity for 30 minutes or more, as able and the need to limit carbohydrate intake to 30 to 60 grams per meal to help with blood sugar control.   The need to take medication as prescribed, test blood sugar as directed, and to call between visits if there is a concern that blood sugar is uncontrolled is also discussed.   Kaylee Montgomery is reminded of the importance of daily foot exam, annual eye examination, and good blood sugar, blood pressure and cholesterol control.     Latest Ref Rng & Units 10/13/2023   12:30 PM 06/10/2023    4:07 PM 08/15/2022    1:25 PM 07/02/2022    1:36 PM 03/19/2022   11:45 AM  Diabetic Labs  HbA1c 4.8 - 5.6 %   7.4     Micro/Creat Ratio 0 - 29 mg/g creat 5  14      Chol 100 - 199 mg/dL 956    387    HDL >56 mg/dL 40    34    Calc LDL 0 - 99 mg/dL 60    54    Triglycerides 0 - 149 mg/dL 433    76    Creatinine 0.57 - 1.00 mg/dL 2.95   1.88  4.16  6.06       10/16/2023   11:00 AM 07/23/2023    9:21 AM 06/10/2023    2:51 PM 12/11/2022    3:07 PM 08/21/2022    4:15 PM 08/21/2022    4:00 PM 08/21/2022    3:45 PM  BP/Weight  Systolic BP 115 128 126 121 110 105 105  Diastolic BP 77 82 85 79 79 75 72  Wt. (Lbs) 201.12 197.9 198 197.12     BMI 34.52 kg/m2 33.97 kg/m2 33.99 kg/m2 33.84 kg/m2         12/11/2022    3:00 PM 07/05/2020    1:00 PM  Foot/eye exam completion dates  Foot Form Completion Done Done        Vitamin D deficiency Controlled on current regime  Common migraine with intractable migraine Well controlled with prophylactic gabapentin

## 2023-10-16 NOTE — Assessment & Plan Note (Signed)
Controlled on current regime 

## 2023-10-16 NOTE — Assessment & Plan Note (Signed)
  Patient re-educated about  the importance of commitment to a  minimum of 150 minutes of exercise per week as able.  The importance of healthy food choices with portion control discussed, as well as eating regularly and within a 12 hour window most days. The need to choose "clean , green" food 50 to 75% of the time is discussed, as well as to make water the primary drink and set a goal of 64 ounces water daily.       10/16/2023   11:00 AM 07/23/2023    9:21 AM 06/10/2023    2:51 PM  Weight /BMI  Weight 201 lb 1.9 oz 197 lb 14.4 oz 198 lb  Height 5\' 4"  (1.626 m) 5\' 4"  (1.626 m) 5\' 4"  (1.626 m)  BMI 34.52 kg/m2 33.97 kg/m2 33.99 kg/m2    Needs to lower calorie intake for weight loss

## 2023-10-16 NOTE — Assessment & Plan Note (Signed)
Controlled, no change in medication DASH diet and commitment to daily physical activity for a minimum of 30 minutes discussed and encouraged, as a part of hypertension management. The importance of attaining a healthy weight is also discussed.     10/16/2023   11:00 AM 07/23/2023    9:21 AM 06/10/2023    2:51 PM 12/11/2022    3:07 PM 08/21/2022    4:15 PM 08/21/2022    4:00 PM 08/21/2022    3:45 PM  BP/Weight  Systolic BP 115 128 126 121 110 105 105  Diastolic BP 77 82 85 79 79 75 72  Wt. (Lbs) 201.12 197.9 198 197.12     BMI 34.52 kg/m2 33.97 kg/m2 33.99 kg/m2 33.84 kg/m2

## 2023-10-16 NOTE — Patient Instructions (Addendum)
Annual exam in 16 to 18 weeks, call if you need me sooner  Congrats on excellent labs , good blood pressure  Weight loss goal of 6 to 9 pounds  It is important that you exercise regularly at least 30 minutes 5 times a week. If you develop chest pain, have severe difficulty breathing, or feel very tired, stop exercising immediately and seek medical attention   Think about what you will eat, plan ahead. Choose " clean, green, fresh or frozen" over canned, processed or packaged foods which are more sugary, salty and fatty. 70 to 75% of food eaten should be vegetables and fruit. Three meals at set times with snacks allowed between meals, but they must be fruit or vegetables. Aim to eat over a 12 hour period , example 7 am to 7 pm, and STOP after  your last meal of the day. Drink water,generally about 64 ounces per day, no other drink is as healthy. Fruit juice is best enjoyed in a healthy way, by EATING the fruit.   Thanks for choosing [facility], we consider it a privelige to serve you.

## 2023-10-16 NOTE — Assessment & Plan Note (Signed)
Hyperlipidemia:Low fat diet discussed and encouraged.   Lipid Panel  Lab Results  Component Value Date   CHOL 120 10/13/2023   HDL 40 10/13/2023   LDLCALC 60 10/13/2023   TRIG 109 10/13/2023   CHOLHDL 3.0 10/13/2023     Controlled, no change in medication

## 2023-12-08 ENCOUNTER — Other Ambulatory Visit: Payer: Self-pay | Admitting: Family Medicine

## 2023-12-08 DIAGNOSIS — E559 Vitamin D deficiency, unspecified: Secondary | ICD-10-CM

## 2023-12-12 ENCOUNTER — Encounter: Payer: Self-pay | Admitting: Family Medicine

## 2023-12-14 ENCOUNTER — Ambulatory Visit: Payer: Self-pay | Admitting: Family Medicine

## 2023-12-14 NOTE — Telephone Encounter (Signed)
 Chief Complaint: Heavy menstrual Symptoms: Heavy vaginal bleeding. Frequency: 3 days Pertinent Negatives: Patient denies Dizziness, weakness, SOB, chest pain. Disposition: [] ED /[] Urgent Care (no appt availability in office) / [x] Appointment(In office/virtual)/ []  [facility] [facility]/ [] Home Care/ [] Refused Recommended Disposition /[] [facility]/ []  Follow-up with PCP Additional Notes: Patient called with complaints of heavy vaginal bleeding from Thursday - Saturday. Patient states that she came on her period on Wednesday and it was light, but on Thursday it was very heavy with patient changing pad q2hr and states that blood would leak down her leg while showering. Patient states that she was consistently passing clots throughout the three days, had some minor cramping, and would have to change underwear due to pads leaking on the wings from the heavy flow. Patient states that she was on a birth control pill but stopped it in March and since then has never a period this heavy in her life until Thursday. Patient states that she usually has a period every month and is regular, however, she did not have a period in January but had a normal one in December. Patient states that she is back to normal today and has only changed her pad twice, period is light with wiping, and denies dizziness, weakness, heart palpitation, SOB, chest pain, shakiness, or blood thinners. Patient states she has started several different vitamins and probiotics but is unsure why her period was this heavy. Patient advised by this RN to be seen in the office within 2 weeks per protocol to which patient was agreeable. Patient advised by this RN to call back with worsening symptoms. Patient verbalized understanding.  Copied from CRM [id number]. Topic: Clinical - Red Word Triage >> Dec 14, 2023  2:42 PM Franchot Heidelberg wrote: Red Word that prompted transfer to Nurse Triage: Bleeding Reason for Disposition  Periods with > 6 soaked  pads or tampons per day  Answer Assessment - Initial Assessment Questions 1. AMOUNT: "Describe the bleeding that you are having."    - SPOTTING: spotting, or pinkish / brownish mucous discharge; does not fill panty liner or pad    - MILD:  less than 1 pad / hour; less than patient's usual menstrual bleeding   - MODERATE: 1-2 pads / hour; 1 menstrual cup every 6 hours; small-medium blood clots (e.g., pea, grape, small coin)   - SEVERE: soaking 2 or more pads/hour for 2 or more hours; 1 menstrual cup every 2 hours; bleeding not contained by pads or continuous red blood from vagina; large blood clots (e.g., golf ball, large coin)    Severe "I was changing a pad like every 2 hours since Thurdsay. Saturday was the worse day. Now it's back to normal and light. I have only changed my pad twice today." 2. ONSET: "When did the bleeding begin?" "Is it continuing now?"     Thursday, "today it's fine." 3. MENSTRUAL PERIOD: "When was the last normal menstrual period?" "How is this different than your period?"     December 12-20th 4. REGULARITY: "How regular are your periods?"    Every month, none on January " I'm pretty regular I dont know what happened with January" 5. ABDOMEN PAIN: "Do you have any pain?" "How bad is the pain?"  (e.g., Scale 1-10; mild, moderate, or severe)   - MILD (1-3): doesn't interfere with normal activities, abdomen soft and not tender to touch    - MODERATE (4-7): interferes with normal activities or awakens from sleep, abdomen tender to touch    -  SEVERE (8-10): excruciating pain, doubled over, unable to do any normal activities      Denies 6. PREGNANCY: "Is there any chance you are pregnant?" "When was your last menstrual period?"     Denies 7. BREASTFEEDING: "Are you breastfeeding?"     Denies 8. HORMONE MEDICINES: "Are you taking any hormone medicines, prescription or over-the-counter?" (e.g., birth control pills, estrogen)     Stopped taking birth control in March 2024 9.  BLOOD THINNER MEDICINES: "Do you take any blood thinners?" (e.g., Coumadin / warfarin, Pradaxa / dabigatran, aspirin)     Denies 10. CAUSE: "What do you think is causing the bleeding?" (e.g., recent gyn surgery, recent gyn procedure; known bleeding disorder, cervical cancer, polycystic ovarian disease, fibroids)         Menstrual,  "I was lifting those bins and I heard that if you life while you're on it it can make you bleed more. I also started align probiotic, seaman probiotic, ashwagandha vitamin, and a cinnamon vitamin last month so I don't know if that has anything to do with anything." 11. HEMODYNAMIC STATUS: "Are you weak or feeling lightheaded?" If Yes, ask: "Can you stand and walk normally?"        "Today I feel fine, I'm not weak or lightheaded, I feel normal" 12. OTHER SYMPTOMS: "What other symptoms are you having with the bleeding?" (e.g., passed tissue, vaginal discharge, fever, menstrual-type cramps)       "Saturday I was cramping--now this is not bad cramping but more just like an uncomfortable cramp--and bloated, I just didn't have energy"  Protocols used: Vaginal Bleeding - Abnormal-A-AH

## 2023-12-16 ENCOUNTER — Ambulatory Visit: Payer: Self-pay | Admitting: Family Medicine

## 2023-12-16 ENCOUNTER — Encounter: Payer: Self-pay | Admitting: Family Medicine

## 2023-12-16 VITALS — BP 108/74 | HR 91 | Resp 16 | Ht 64.0 in | Wt 202.0 lb

## 2023-12-16 DIAGNOSIS — N921 Excessive and frequent menstruation with irregular cycle: Secondary | ICD-10-CM | POA: Diagnosis not present

## 2023-12-16 DIAGNOSIS — I10 Essential (primary) hypertension: Secondary | ICD-10-CM | POA: Diagnosis not present

## 2023-12-16 DIAGNOSIS — N926 Irregular menstruation, unspecified: Secondary | ICD-10-CM

## 2023-12-16 DIAGNOSIS — N920 Excessive and frequent menstruation with regular cycle: Secondary | ICD-10-CM | POA: Insufficient documentation

## 2023-12-16 MED ORDER — AMLODIPINE BESYLATE 10 MG PO TABS: 10.0000 mg | ORAL_TABLET | Freq: Every day | ORAL | 5 refills | Status: DC

## 2023-12-16 NOTE — Assessment & Plan Note (Addendum)
 First episode , 4 days became light headed this past Saturday, lab update and urgent Gyne eval

## 2023-12-16 NOTE — Progress Notes (Signed)
   Kaylee Montgomery     MRN: [id number]      DOB: 1981/04/10  Chief Complaint  Patient presents with   Menorrhagia    Has been having a longer cycle and heavy bleeding this cycle. Had a 4 day cycle in Dec, no cycle in Jan and current cycle started 2/19    HPI Kaylee Montgomery is here feporting floodingx 4 days, clots and pouring  from last Thursday to sungay, prior to this didnotbleed in Jan but has had regular light cycles the entire 2024 Notes beard  growth since 2025, had nocycle for years and has h/o pituitary adenoma with hyperprolactinemia Reports iuncontrolled blood sugar  ROS Denies recent fever or chills. Denies sinus pressure, nasal congestion, ear pain or sore throat. Denies chest congestion, productive cough or wheezing. Denies chest pains, palpitations and leg swelling Denies abdominal pain, nausea, vomiting,diarrhea or constipation.   Denies dysuria, frequency, hesitancy or incontinence. Denies joint pain, swelling and limitation in mobility. Denies headaches, seizures, numbness, or tingling. Denies depression, anxiety or insomnia. Denies skin break down or rash.   PE  BP 108/74   Pulse 91   Resp 16   Ht 5\' 4"  (1.626 m)   Wt 202 lb (91.6 kg)   SpO2 94%   BMI 34.67 kg/m   Patient alert and oriented and in no cardiopulmonary distress.  HEENT: No facial asymmetry, EOMI,     Neck supple .  Chest: Clear to auscultation bilaterally.  CVS: S1, S2 no murmurs, no S3.Regular rate.  ABD: Soft non tender.   Ext: No edema  MS: Adequate ROM spine, shoulders, hips and knees.  Skin: Intact, no ulcerations or rash noted.  Psych: Good eye contact, normal affect. Memory intact not anxious or depressed appearing.  CNS: CN 2-12 intact, power,  normal throughout.no focal deficits noted.   Assessment & Plan  Heavy menses First episode , 4 days became light headed this past Saturday, lab update and urgent Gyne eval  Irregular menses Reported in past 2  months  Essential hypertension, benign Controlled, no change in medication

## 2023-12-16 NOTE — Patient Instructions (Addendum)
 Reschedule April appointment to May please  at checkout   CBC, PT/PTT and chem 7 and EGFr Nurse pls provide info on Dr cousins, pt will callher  Thanks for choosing [facility], we consider it a privelige to serve you.

## 2023-12-17 ENCOUNTER — Encounter: Payer: Self-pay | Admitting: Family Medicine

## 2023-12-17 LAB — CBC WITH DIFFERENTIAL/PLATELET
Basophils Absolute: 0.1 10*3/uL (ref 0.0–0.2)
Basos: 1 %
EOS (ABSOLUTE): 0.1 10*3/uL (ref 0.0–0.4)
Eos: 1 %
Hematocrit: 38.9 % (ref 34.0–46.6)
Hemoglobin: 12.8 g/dL (ref 11.1–15.9)
Immature Grans (Abs): 0 10*3/uL (ref 0.0–0.1)
Immature Granulocytes: 0 %
Lymphocytes Absolute: 1.8 10*3/uL (ref 0.7–3.1)
Lymphs: 27 %
MCH: 25.7 pg — ABNORMAL LOW (ref 26.6–33.0)
MCHC: 32.9 g/dL (ref 31.5–35.7)
MCV: 78 fL — ABNORMAL LOW (ref 79–97)
Monocytes Absolute: 0.5 10*3/uL (ref 0.1–0.9)
Monocytes: 7 %
Neutrophils Absolute: 4.1 10*3/uL (ref 1.4–7.0)
Neutrophils: 64 %
Platelets: 324 10*3/uL (ref 150–450)
RBC: 4.99 x10E6/uL (ref 3.77–5.28)
RDW: 12.1 % (ref 11.7–15.4)
WBC: 6.4 10*3/uL (ref 3.4–10.8)

## 2023-12-17 LAB — BMP8+EGFR
BUN/Creatinine Ratio: 12 (ref 9–23)
BUN: 9 mg/dL (ref 6–24)
CO2: 22 mmol/L (ref 20–29)
Calcium: 9.8 mg/dL (ref 8.7–10.2)
Chloride: 105 mmol/L (ref 96–106)
Creatinine, Ser: 0.78 mg/dL (ref 0.57–1.00)
Glucose: 155 mg/dL — ABNORMAL HIGH (ref 70–99)
Potassium: 3.9 mmol/L (ref 3.5–5.2)
Sodium: 142 mmol/L (ref 134–144)
eGFR: 96 mL/min/{1.73_m2} (ref >59)

## 2023-12-17 LAB — PT AND PTT
INR: 0.9 (ref 0.9–1.2)
Prothrombin Time: 10.7 s (ref 9.1–12.0)
aPTT: 25 s (ref 24–33)

## 2023-12-21 ENCOUNTER — Telehealth: Payer: Self-pay | Admitting: Family Medicine

## 2023-12-21 NOTE — Telephone Encounter (Signed)
 Copied from CRM [id number]. Topic: Referral - Request for Referral >> Dec 21, 2023  9:40 AM Payton Doughty wrote: Did the patient discuss referral with their provider in the last year? Yes (If No - schedule appointment) (If Yes - send message)  Appointment offered? Yes  Type of order/referral and detailed reason for visit: OBGYN  Dr Cherly Hensen  This referral was sent to [facility] which is wrong location for this referral.   Preference of office, provider, location: Dr Cindy Hazy [facility] [address] [place] [place], [place] [address] [phone]  If referral order, have you been seen by this specialty before? Yes (If Yes, this issue or another issue? When? Where? Dr Wonda Olds Can we respond through MyChart? Yes  Dr [employer] fax  [phone]

## 2023-12-22 ENCOUNTER — Encounter: Payer: Self-pay | Admitting: Family Medicine

## 2023-12-22 ENCOUNTER — Ambulatory Visit: Payer: Self-pay | Admitting: Family Medicine

## 2023-12-22 NOTE — Telephone Encounter (Signed)
 Referral has been redirected to Dr. Cherly Hensen Office as Patient requested.

## 2023-12-22 NOTE — Assessment & Plan Note (Signed)
 Reported in past 2 months

## 2023-12-22 NOTE — Assessment & Plan Note (Signed)
 Controlled, no change in medication

## 2023-12-22 NOTE — Telephone Encounter (Signed)
 Please advise  on patient referral         Copied from CRM [id number]. Topic: Referral - Request for Referral >> Dec 21, 2023  9:40 AM Kaylee Montgomery wrote: Did the patient discuss referral with their provider in the last year? Yes (If No - schedule appointment) (If Yes - send message)   Appointment offered? Yes   Type of order/referral and detailed reason for visit: OBGYN  Kaylee Montgomery   This referral was sent to [facility] which is wrong location for this referral.    Preference of office, provider, location: Kaylee Montgomery [facility] [address] [place] [place], [place] [address] [phone]   If referral order, have you been seen by this specialty before? Yes (If Yes, this issue or another issue? When? Where? Kaylee Montgomery Can we respond through MyChart? Yes   Kaylee Montgomery's fax  [phone]      Note

## 2023-12-22 NOTE — Telephone Encounter (Signed)
 Summary: possible yeast infection rx requested   Copied From CRM [id number]. Reason for Triage: Pt requesting fluconazole for a possible yeast infection. Patient has itching, symptoms started Sunday.  Please assist patient further           Chief Complaint: Possible yeast infection Symptoms: vaginal itching Frequency: 2 days Pertinent Negatives: Patient denies blood, odor,  Disposition: [] ED /[] Urgent Care (no appt availability in office) / [] Appointment(In office/virtual)/ []  [facility]/ [] Home Care/ [] Refused Recommended Disposition /[] [place] [facility]/ []  Follow-up with PCP Additional Notes: Patient called and advised that she has been vaginal itching and she has had yeast infections in the past and had prescriptions of Fluconazole in the past.  Patient just had an appointment in the office with her PCP last week and was hoping that she could just a prescription sent in for this concern.  She denies any odor, blood, fevers, injuries, or pain. She is just having some uncomfortable itching at this time for the past two days. Patient given Care Advice as per protocol and is also advised that if anything worsens, to go to the emergency room.  Patient verbalized understanding. Patient is waiting for the [facility] to send her medical records to Dr Cherly Hensen before that appointment is scheduled---she just wanted to let her PCP know this information.  Reason for Disposition  [1] Symptoms of a yeast infection (i.e., itchy, white discharge, not bad smelling) AND [2] feels like prior vaginal yeast infections  Answer Assessment - Initial Assessment Questions 1. SYMPTOM: "What's the main symptom you're concerned about?" (e.g., pain, itching, dryness)     itching 2. LOCATION: "Where is the  itching located?" (e.g., inside/outside, left/right)     itching 3. ONSET: "When did the  itching  start?"     2 days 4. PAIN: "Is there any pain?" If Yes, ask: "How bad is it?" (Scale:  1-10; mild, moderate, severe)   -  MILD (1-3): Doesn't interfere with normal activities.    -  MODERATE (4-7): Interferes with normal activities (e.g., work or school) or awakens from sleep.     -  SEVERE (8-10): Excruciating pain, unable to do any normal activities.     0 5. ITCHING: "Is there any itching?" If Yes, ask: "How bad is it?" (Scale: 1-10; mild, moderate, severe)     3-4 6. CAUSE: "What do you think is causing the discharge?" "Have you had the same problem before? What happened then?"     Yes--a couple of times--given prescriptions 7. OTHER SYMPTOMS: "Do you have any other symptoms?" (e.g., fever, itching, vaginal bleeding, pain with urination, injury to genital area, vaginal foreign body)     "No--just happens" 8. PREGNANCY: "Is there any chance you are pregnant?" "When was your last menstrual period?"     No--at last visit in office  Protocols used: Vaginal Symptoms-A-AH

## 2023-12-23 MED ORDER — FLUCONAZOLE 150 MG PO TABS: ORAL_TABLET | ORAL | 0 refills | Status: DC

## 2023-12-23 NOTE — Telephone Encounter (Signed)
 Uncomfortable vaginal itching for 2 days. Requesting fluconazole. Pls advise

## 2023-12-23 NOTE — Telephone Encounter (Signed)
 Med is sent

## 2024-01-08 ENCOUNTER — Other Ambulatory Visit: Payer: Self-pay | Admitting: Family Medicine

## 2024-02-04 ENCOUNTER — Other Ambulatory Visit: Payer: Self-pay | Admitting: Family Medicine

## 2024-02-05 ENCOUNTER — Ambulatory Visit: Payer: BC Managed Care – PPO | Admitting: Family Medicine

## 2024-02-09 ENCOUNTER — Other Ambulatory Visit: Payer: Self-pay | Admitting: Family Medicine

## 2024-03-10 ENCOUNTER — Other Ambulatory Visit: Payer: Self-pay | Admitting: Family Medicine

## 2024-03-10 ENCOUNTER — Encounter: Payer: Self-pay | Admitting: Family Medicine

## 2024-03-10 ENCOUNTER — Ambulatory Visit: Payer: BC Managed Care – PPO | Admitting: Family Medicine

## 2024-03-10 VITALS — BP 116/80 | HR 102 | Resp 18 | Ht 64.0 in | Wt 199.1 lb

## 2024-03-10 DIAGNOSIS — E559 Vitamin D deficiency, unspecified: Secondary | ICD-10-CM | POA: Diagnosis not present

## 2024-03-10 DIAGNOSIS — E1169 Type 2 diabetes mellitus with other specified complication: Secondary | ICD-10-CM

## 2024-03-10 DIAGNOSIS — N6092 Unspecified benign mammary dysplasia of left breast: Secondary | ICD-10-CM

## 2024-03-10 DIAGNOSIS — Z1211 Encounter for screening for malignant neoplasm of colon: Secondary | ICD-10-CM

## 2024-03-10 DIAGNOSIS — E782 Mixed hyperlipidemia: Secondary | ICD-10-CM

## 2024-03-10 DIAGNOSIS — I10 Essential (primary) hypertension: Secondary | ICD-10-CM | POA: Diagnosis not present

## 2024-03-10 DIAGNOSIS — Z1231 Encounter for screening mammogram for malignant neoplasm of breast: Secondary | ICD-10-CM

## 2024-03-10 DIAGNOSIS — N926 Irregular menstruation, unspecified: Secondary | ICD-10-CM | POA: Diagnosis not present

## 2024-03-10 MED ORDER — SCOPOLAMINE 1 MG/3DAYS TD PT72: 1.0000 | MEDICATED_PATCH | TRANSDERMAL | 12 refills | Status: DC

## 2024-03-10 NOTE — Assessment & Plan Note (Signed)
 Controlled, no change in medication DASH diet and commitment to daily physical activity for a minimum of 30 minutes discussed and encouraged, as a part of hypertension management. The importance of attaining a healthy weight is also discussed.     03/10/2024    3:08 PM 12/16/2023    2:18 PM 10/16/2023   11:00 AM 07/23/2023    9:21 AM 06/10/2023    2:51 PM 12/11/2022    3:07 PM 08/21/2022    4:15 PM  BP/Weight  Systolic BP 116 108 115 128 126 121 110  Diastolic BP 80 74 77 82 85 79 79  Wt. (Lbs) 199.08 202 201.12 197.9 198 197.12   BMI 34.17 kg/m2 34.67 kg/m2 34.52 kg/m2 33.97 kg/m2 33.99 kg/m2 33.84 kg/m2

## 2024-03-10 NOTE — Assessment & Plan Note (Signed)
 Updated lab needed at/ before next visit.

## 2024-03-10 NOTE — Progress Notes (Signed)
 Kaylee Montgomery     MRN: [id number]      DOB: 09/15/1981  Chief Complaint  Patient presents with   Hypertension    16 week follow up     HPI Kaylee Montgomery is here for follow up and re-evaluation of chronic medical conditions, medication management and review of any available recent lab and radiology data.  Preventive health is updated, specifically  Cancer screening and Immunization.   Has seen Gyne re PMB, no change in OCP , will have rept check also started on zepbound by Endo fore improved blood sugar control Requests medication for motion sickness ans will be sailing on a yacht in the near future ROS Denies recent fever or chills. Denies sinus pressure, nasal congestion, ear pain or sore throat. Denies chest congestion, productive cough or wheezing. Denies chest pains, palpitations and leg swelling Denies abdominal pain, nausea, vomiting,diarrhea or constipation.   Denies dysuria, frequency, hesitancy or incontinence. Denies joint pain, swelling and limitation in mobility. Denies headaches, seizures, numbness, or tingling. Denies depression, anxiety or insomnia. Denies skin break down or rash.   PE  BP 116/80   Pulse (!) 102   Resp 18   Ht 5\' 4"  (1.626 m)   Wt 199 lb 1.3 oz (90.3 kg)   SpO2 97%   BMI 34.17 kg/m   Patient alert and oriented and in no cardiopulmonary distress.  HEENT: No facial asymmetry, EOMI,     Neck supple .  Chest: Clear to auscultation bilaterally.  CVS: S1, S2 no murmurs, no S3.Regular rate.  ABD: Soft non tender.   Ext: No edema  MS: Adequate ROM spine, shoulders, hips and knees.  Skin: Intact, no ulcerations or rash noted.  Psych: Good eye contact, normal affect. Memory intact not anxious or depressed appearing.  CNS: CN 2-12 intact, power,  normal throughout.no focal deficits noted.   Assessment & Plan  Type 2 diabetes mellitus with other specified complication (HCC) Diabetes associated with hypertension, hyperlipidemia, and  obesity  Kaylee Montgomery is reminded of the importance of commitment to daily physical activity for 30 minutes or more, as able and the need to limit carbohydrate intake to 30 to 60 grams per meal to help with blood sugar control.   The need to take medication as prescribed, test blood sugar as directed, and to call between visits if there is a concern that blood sugar is uncontrolled is also discussed.   Kaylee Montgomery is reminded of the importance of daily foot exam, annual eye examination, and good blood sugar, blood pressure and cholesterol control.     Latest Ref Rng & Units 12/16/2023    3:04 PM 10/13/2023   12:30 PM 06/10/2023    4:07 PM 08/15/2022    1:25 PM 07/02/2022    1:36 PM  Diabetic Labs  HbA1c 4.8 - 5.6 %    7.4    Micro/Creat Ratio 0 - 29 mg/g creat  5  14     Chol 100 - 199 mg/dL  409    811   HDL >91 mg/dL  40    34   Calc LDL 0 - 99 mg/dL  60    54   Triglycerides 0 - 149 mg/dL  478    76   Creatinine 0.57 - 1.00 mg/dL 2.95  6.21   3.08  6.57       03/10/2024    3:08 PM 12/16/2023    2:18 PM 10/16/2023   11:00 AM 07/23/2023  9:21 AM 06/10/2023    2:51 PM 12/11/2022    3:07 PM 08/21/2022    4:15 PM  BP/Weight  Systolic BP 116 108 115 128 126 121 110  Diastolic BP 80 74 77 82 85 79 79  Wt. (Lbs) 199.08 202 201.12 197.9 198 197.12   BMI 34.17 kg/m2 34.67 kg/m2 34.52 kg/m2 33.97 kg/m2 33.99 kg/m2 33.84 kg/m2       12/11/2022    3:00 PM 07/05/2020    1:00 PM  Foot/eye exam completion dates  Foot Form Completion Done Done    Managed by Endo, not currently at goal but improving    Vitamin D  deficiency Updated lab needed at/ before next visit.   Mixed hyperlipidemia Hyperlipidemia:Low fat diet discussed and encouraged.   Lipid Panel  Lab Results  Component Value Date   CHOL 120 10/13/2023   HDL 40 10/13/2023   LDLCALC 60 10/13/2023   TRIG 109 10/13/2023   CHOLHDL 3.0 10/13/2023     Updated lab needed at/ before next visit.   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 The importance of attaining a healthy weight is also discussed.     03/10/2024    3:08 PM 12/16/2023    2:18 PM 10/16/2023   11:00 AM 07/23/2023    9:21 AM 06/10/2023    2:51 PM 12/11/2022    3:07 PM 08/21/2022    4:15 PM  BP/Weight  Systolic BP 116 108 115 128 126 121 110  Diastolic BP 80 74 77 82 85 79 79  Wt. (Lbs) 199.08 202 201.12 197.9 198 197.12   BMI 34.17 kg/m2 34.67 kg/m2 34.52 kg/m2 33.97 kg/m2 33.99 kg/m2 33.84 kg/m2

## 2024-03-10 NOTE — Assessment & Plan Note (Signed)
 Diabetes associated with hypertension, hyperlipidemia, and obesity  Kaylee Montgomery is reminded of the importance of commitment to daily physical activity for 30 minutes or more, as able and the need to limit carbohydrate intake to 30 to 60 grams per meal to help with blood sugar control.   The need to take medication as prescribed, test blood sugar as directed, and to call between visits if there is a concern that blood sugar is uncontrolled is also discussed.   Kaylee Montgomery is reminded of the importance of daily foot exam, annual eye examination, and good blood sugar, blood pressure and cholesterol control.     Latest Ref Rng & Units 12/16/2023    3:04 PM 10/13/2023   12:30 PM 06/10/2023    4:07 PM 08/15/2022    1:25 PM 07/02/2022    1:36 PM  Diabetic Labs  HbA1c 4.8 - 5.6 %    7.4    Micro/Creat Ratio 0 - 29 mg/g creat  5  14     Chol 100 - 199 mg/dL  657    846   HDL >96 mg/dL  40    34   Calc LDL 0 - 99 mg/dL  60    54   Triglycerides 0 - 149 mg/dL  295    76   Creatinine 0.57 - 1.00 mg/dL 2.84  1.32   4.40  1.02       03/10/2024    3:08 PM 12/16/2023    2:18 PM 10/16/2023   11:00 AM 07/23/2023    9:21 AM 06/10/2023    2:51 PM 12/11/2022    3:07 PM 08/21/2022    4:15 PM  BP/Weight  Systolic BP 116 108 115 128 126 121 110  Diastolic BP 80 74 77 82 85 79 79  Wt. (Lbs) 199.08 202 201.12 197.9 198 197.12   BMI 34.17 kg/m2 34.67 kg/m2 34.52 kg/m2 33.97 kg/m2 33.99 kg/m2 33.84 kg/m2       12/11/2022    3:00 PM 07/05/2020    1:00 PM  Foot/eye exam completion dates  Foot Form Completion Done Done    Managed by Endo, not currently at goal but improving

## 2024-03-10 NOTE — Assessment & Plan Note (Signed)
 On tamoxifen  per Oncology

## 2024-03-10 NOTE — Assessment & Plan Note (Signed)
 Controlled on OCP being followed by Dru Georges Lesta Rater)

## 2024-03-10 NOTE — Patient Instructions (Addendum)
 F/U in 6 months, call if you need me sooner  You are referred for colonoscopy due after your 43!  It is important that you exercise regularly at least 30 minutes 5 times a week. If you develop chest pain, have severe difficulty breathing, or feel very tired, stop exercising immediately and seek medical attention   Think about what you will eat, plan ahead. Choose " clean, green, fresh or frozen" over canned, processed or packaged foods which are more sugary, salty and fatty. 70 to 75% of food eaten should be vegetables and fruit. Three meals at set times with snacks allowed between meals, but they must be fruit or vegetables. Aim to eat over a 12 hour period , example 7 am to 7 pm, and STOP after  your last meal of the day. Drink water ,generally about 64 ounces per day, no other drink is as healthy. Fruit juice is best enjoyed in a healthy way, by EATING the fruit.    Pls send for eye exam [place] 05/2023  Pls send for  HBa1C and enter from Dr Ronelle Coffee ( see if you can find on record) pt just had labs this month   Pls schedule mammogram at breast ctr  or 10/02 pt has appt with oncology 07/22/2024 and needs the result if at all possible  Thanks for choosing [facility], we consider it a privelige to serve you.

## 2024-03-10 NOTE — Assessment & Plan Note (Signed)
 Hyperlipidemia:Low fat diet discussed and encouraged.   Lipid Panel  Lab Results  Component Value Date   CHOL 120 10/13/2023   HDL 40 10/13/2023   LDLCALC 60 10/13/2023   TRIG 109 10/13/2023   CHOLHDL 3.0 10/13/2023     Updated lab needed at/ before next visit.

## 2024-03-17 ENCOUNTER — Encounter (INDEPENDENT_AMBULATORY_CARE_PROVIDER_SITE_OTHER): Payer: Self-pay | Admitting: *Deleted

## 2024-04-19 ENCOUNTER — Other Ambulatory Visit: Payer: Self-pay | Admitting: Family Medicine

## 2024-04-26 ENCOUNTER — Other Ambulatory Visit: Payer: Self-pay | Admitting: Family Medicine

## 2024-05-27 ENCOUNTER — Other Ambulatory Visit: Payer: Self-pay | Admitting: Family Medicine

## 2024-05-30 ENCOUNTER — Other Ambulatory Visit: Payer: Self-pay | Admitting: Family Medicine

## 2024-05-30 DIAGNOSIS — E559 Vitamin D deficiency, unspecified: Secondary | ICD-10-CM

## 2024-06-14 ENCOUNTER — Ambulatory Visit: Admitting: Family Medicine

## 2024-06-14 ENCOUNTER — Encounter: Payer: Self-pay | Admitting: Family Medicine

## 2024-06-14 VITALS — BP 113/76 | HR 106 | Resp 16 | Ht 64.0 in | Wt 190.1 lb

## 2024-06-14 DIAGNOSIS — Z309 Encounter for contraceptive management, unspecified: Secondary | ICD-10-CM

## 2024-06-14 DIAGNOSIS — E66811 Obesity, class 1: Secondary | ICD-10-CM

## 2024-06-14 DIAGNOSIS — E1169 Type 2 diabetes mellitus with other specified complication: Secondary | ICD-10-CM

## 2024-06-14 DIAGNOSIS — H00024 Hordeolum internum left upper eyelid: Secondary | ICD-10-CM

## 2024-06-14 DIAGNOSIS — Z0000 Encounter for general adult medical examination without abnormal findings: Secondary | ICD-10-CM

## 2024-06-14 DIAGNOSIS — Z23 Encounter for immunization: Secondary | ICD-10-CM

## 2024-06-14 DIAGNOSIS — E559 Vitamin D deficiency, unspecified: Secondary | ICD-10-CM

## 2024-06-14 DIAGNOSIS — I10 Essential (primary) hypertension: Secondary | ICD-10-CM | POA: Diagnosis not present

## 2024-06-14 DIAGNOSIS — E782 Mixed hyperlipidemia: Secondary | ICD-10-CM | POA: Diagnosis not present

## 2024-06-14 DIAGNOSIS — Z1211 Encounter for screening for malignant neoplasm of colon: Secondary | ICD-10-CM

## 2024-06-14 NOTE — Patient Instructions (Addendum)
 F/U in 6 months, HPV #3 at visit  HPV #1 today an needs 2 additional appts with nurse at 2 and 6 months  Nurse pls send for 08/05 eye exam , Dr Darroll  Fasting lipid, cmp and EGFR, CBC, TSH and vit D when you are getting labs for Endo  Excellent foot exam and BP  Babara are referred to [facility] for annual well woman exam and discussion re additional contraception as in BTL  You are referred for colonoscopy due AFTER you are 43 !  It is important that you exercise regularly at least 30 minutes 5 times a week. If you develop chest pain, have severe difficulty breathing, or feel very tired, stop exercising immediately and seek medical attention   Think about what you will eat, plan ahead. Choose  clean, green, fresh or frozen over canned, processed or packaged foods which are more sugary, salty and fatty. 70 to 75% of food eaten should be vegetables and fruit. Three meals at set times with snacks allowed between meals, but they must be fruit or vegetables. Aim to eat over a 12 hour period , example 7 am to 7 pm, and STOP after  your last meal of the day. Drink water ,generally about 64 ounces per day, no other drink is as healthy. Fruit juice is best enjoyed in a healthy way, by EATING the fruit. Thanks for choosing [facility], we consider it a privelige to serve you. For stye just use warm compresses 4 to 5 times daily, no medication indicated or prescribed currently, call back if worsens, as in pain, drainage or any vision change

## 2024-06-22 ENCOUNTER — Encounter (INDEPENDENT_AMBULATORY_CARE_PROVIDER_SITE_OTHER): Payer: Self-pay | Admitting: *Deleted

## 2024-06-30 ENCOUNTER — Encounter: Payer: Self-pay | Admitting: Family Medicine

## 2024-06-30 DIAGNOSIS — Z0000 Encounter for general adult medical examination without abnormal findings: Secondary | ICD-10-CM | POA: Insufficient documentation

## 2024-06-30 DIAGNOSIS — Z309 Encounter for contraceptive management, unspecified: Secondary | ICD-10-CM | POA: Insufficient documentation

## 2024-06-30 DIAGNOSIS — Z23 Encounter for immunization: Secondary | ICD-10-CM | POA: Insufficient documentation

## 2024-06-30 DIAGNOSIS — Z1211 Encounter for screening for malignant neoplasm of colon: Secondary | ICD-10-CM | POA: Insufficient documentation

## 2024-06-30 DIAGNOSIS — H00029 Hordeolum internum unspecified eye, unspecified eyelid: Secondary | ICD-10-CM | POA: Insufficient documentation

## 2024-06-30 NOTE — Assessment & Plan Note (Signed)
 Cool compresses every 4 hours  while awake

## 2024-06-30 NOTE — Assessment & Plan Note (Signed)
 Updated lab needed at/ before next visit.

## 2024-06-30 NOTE — Assessment & Plan Note (Addendum)
 Diabetes associated with hypertension, hyperlipidemia, and obesity  Kaylee Montgomery is reminded of the importance of commitment to daily physical activity for 30 minutes or more, as able and the need to limit carbohydrate intake to 30 to 60 grams per meal to help with blood sugar control.   The need to take medication as prescribed, test blood sugar as directed, and to call between visits if there is a concern that blood sugar is uncontrolled is also discussed.   Kaylee Montgomery is reminded of the importance of daily foot exam, annual eye examination, and good blood sugar, blood pressure and cholesterol control. Managed by Endo, and controlled     Latest Ref Rng & Units 12/16/2023    3:04 PM 10/13/2023   12:30 PM 06/10/2023    4:07 PM 08/15/2022    1:25 PM 07/02/2022    1:36 PM  Diabetic Labs  HbA1c 4.8 - 5.6 %    7.4    Micro/Creat Ratio 0 - 29 mg/g creat  5  14     Chol 100 - 199 mg/dL  879    895   HDL >60 mg/dL  40    34   Calc LDL 0 - 99 mg/dL  60    54   Triglycerides 0 - 149 mg/dL  890    76   Creatinine 0.57 - 1.00 mg/dL 9.21  9.42   9.44  9.23       06/14/2024    3:00 PM 03/10/2024    3:08 PM 12/16/2023    2:18 PM 10/16/2023   11:00 AM 07/23/2023    9:21 AM 06/10/2023    2:51 PM 12/11/2022    3:07 PM  BP/Weight  Systolic BP 113 116 108 115 128 126 121  Diastolic BP 76 80 74 77 82 85 79  Wt. (Lbs) 190.12 199.08 202 201.12 197.9 198 197.12  BMI 32.63 kg/m2 34.17 kg/m2 34.67 kg/m2 34.52 kg/m2 33.97 kg/m2 33.99 kg/m2 33.84 kg/m2      06/14/2024    3:00 PM 12/11/2022    3:00 PM  Foot/eye exam completion dates  Foot Form Completion Done Done   Managed by Endo and controlled

## 2024-06-30 NOTE — Progress Notes (Signed)
 Kaylee Montgomery     MRN: [id number]      DOB: 04/12/1981  Chief Complaint  Patient presents with   Hypertension    6 month follow up    Stye    Pt complains of stye in left eye since Friday night     HPI Kaylee Montgomery is here for follow up and re-evaluation of chronic medical conditions, medication management and review of any available recent lab and radiology data.  Preventive health is updated, specifically  Cancer screening and Immunization.   Has upcoming Endo appt and needs Gyne eval C/o left stye for several days ROS Denies recent fever or chills. Denies sinus pressure, nasal congestion, ear pain or sore throat. Denies chest congestion, productive cough or wheezing. Denies chest pains, palpitations and leg swelling Denies abdominal pain, nausea, vomiting,diarrhea or constipation.   Denies dysuria, frequency, hesitancy or incontinence. Denies joint pain, swelling and limitation in mobility. Denies headaches, seizures, numbness, or tingling. Denies depression, anxiety or insomnia.  PE  BP 113/76   Pulse (!) 106   Resp 16   Ht 5' 4 (1.626 m)   Wt 190 lb 1.9 oz (86.2 kg)   SpO2 98%   BMI 32.63 kg/m   Patient alert and oriented and in no cardiopulmonary distress.  HEENT: No facial asymmetry, EOMI,     Neck supple .Stye left upper eyelid  Chest: Clear to auscultation bilaterally.  CVS: S1, S2 no murmurs, no S3.Regular rate.  ABD: Soft non tender.   Ext: No edema  MS: Adequate ROM spine, shoulders, hips and knees.  Skin: Intact, no ulcerations or rash noted.  Psych: Good eye contact, normal affect. Memory intact not anxious or depressed appearing.  CNS: CN 2-12 intact, power,  normal throughout.no focal deficits noted.   Assessment & Plan  Essential hypertension, benign Controlled, no change in medication DASH diet and commitment to daily physical activity for a minimum of 30 minutes discussed and encouraged, as a part of hypertension management. The  importance of attaining a healthy weight is also discussed.     06/14/2024    3:00 PM 03/10/2024    3:08 PM 12/16/2023    2:18 PM 10/16/2023   11:00 AM 07/23/2023    9:21 AM 06/10/2023    2:51 PM 12/11/2022    3:07 PM  BP/Weight  Systolic BP 113 116 108 115 128 126 121  Diastolic BP 76 80 74 77 82 85 79  Wt. (Lbs) 190.12 199.08 202 201.12 197.9 198 197.12  BMI 32.63 kg/m2 34.17 kg/m2 34.67 kg/m2 34.52 kg/m2 33.97 kg/m2 33.99 kg/m2 33.84 kg/m2       Type 2 diabetes mellitus with other specified complication (HCC) Diabetes associated with hypertension, hyperlipidemia, and obesity  Kaylee Montgomery is reminded of the importance of commitment to daily physical activity for 30 minutes or more, as able and the need to limit carbohydrate intake to 30 to 60 grams per meal to help with blood sugar control.   The need to take medication as prescribed, test blood sugar as directed, and to call between visits if there is a concern that blood sugar is uncontrolled is also discussed.   Kaylee Montgomery is reminded of the importance of daily foot exam, annual eye examination, and good blood sugar, blood pressure and cholesterol control. Managed by Endo, and controlled     Latest Ref Rng & Units 12/16/2023    3:04 PM 10/13/2023   12:30 PM 06/10/2023    4:07 PM 08/15/2022  1:25 PM 07/02/2022    1:36 PM  Diabetic Labs  HbA1c 4.8 - 5.6 %    7.4    Micro/Creat Ratio 0 - 29 mg/g creat  5  14     Chol 100 - 199 mg/dL  879    895   HDL >60 mg/dL  40    34   Calc LDL 0 - 99 mg/dL  60    54   Triglycerides 0 - 149 mg/dL  890    76   Creatinine 0.57 - 1.00 mg/dL 9.21  9.42   9.44  9.23       06/14/2024    3:00 PM 03/10/2024    3:08 PM 12/16/2023    2:18 PM 10/16/2023   11:00 AM 07/23/2023    9:21 AM 06/10/2023    2:51 PM 12/11/2022    3:07 PM  BP/Weight  Systolic BP 113 116 108 115 128 126 121  Diastolic BP 76 80 74 77 82 85 79  Wt. (Lbs) 190.12 199.08 202 201.12 197.9 198 197.12  BMI 32.63 kg/m2 34.17  kg/m2 34.67 kg/m2 34.52 kg/m2 33.97 kg/m2 33.99 kg/m2 33.84 kg/m2      06/14/2024    3:00 PM 12/11/2022    3:00 PM  Foot/eye exam completion dates  Foot Form Completion Done Done   Managed by Endo and controlled     Vitamin D  deficiency Updated lab needed at/ before next visit.   Obesity (BMI 30.0-34.9)  Patient re-educated about  the importance of commitment to a  minimum of 150 minutes of exercise per week as able.  The importance of healthy food choices with portion control discussed, as well as eating regularly and within a 12 hour window most days. The need to choose clean , green food 50 to 75% of the time is discussed, as well as to make water  the primary drink and set a goal of 64 ounces water  daily.       06/14/2024    3:00 PM 03/10/2024    3:08 PM 12/16/2023    2:18 PM  Weight /BMI  Weight 190 lb 1.9 oz 199 lb 1.3 oz 202 lb  Height 5' 4 (1.626 m) 5' 4 (1.626 m) 5' 4 (1.626 m)  BMI 32.63 kg/m2 34.17 kg/m2 34.67 kg/m2      Immunization due After obtaining informed consent, the HPV  vaccine is  administered , with no adverse effect noted at the time of administration.   Visit for annual health examination Referred to gyne also for contraceptive needs  Colon cancer screening Colonoscopy to start at age 79 , referral entered  Meibomian sty Cool compresses every 4 hours  while awake

## 2024-06-30 NOTE — Assessment & Plan Note (Signed)
 Colonoscopy to start at age 43 , referral entered

## 2024-06-30 NOTE — Assessment & Plan Note (Addendum)
 After obtaining informed consent, the HPV #!  vaccine is  administered , with no adverse effect noted at the time of administration.

## 2024-06-30 NOTE — Assessment & Plan Note (Signed)
 Referred to gyne also for contraceptive needs

## 2024-06-30 NOTE — Assessment & Plan Note (Signed)
  Patient re-educated about  the importance of commitment to a  minimum of 150 minutes of exercise per week as able.  The importance of healthy food choices with portion control discussed, as well as eating regularly and within a 12 hour window most days. The need to choose clean , green food 50 to 75% of the time is discussed, as well as to make water  the primary drink and set a goal of 64 ounces water  daily.       06/14/2024    3:00 PM 03/10/2024    3:08 PM 12/16/2023    2:18 PM  Weight /BMI  Weight 190 lb 1.9 oz 199 lb 1.3 oz 202 lb  Height 5' 4 (1.626 m) 5' 4 (1.626 m) 5' 4 (1.626 m)  BMI 32.63 kg/m2 34.17 kg/m2 34.67 kg/m2

## 2024-06-30 NOTE — Assessment & Plan Note (Signed)
 Controlled, no change in medication DASH diet and commitment to daily physical activity for a minimum of 30 minutes discussed and encouraged, as a part of hypertension management. The importance of attaining a healthy weight is also discussed.     06/14/2024    3:00 PM 03/10/2024    3:08 PM 12/16/2023    2:18 PM 10/16/2023   11:00 AM 07/23/2023    9:21 AM 06/10/2023    2:51 PM 12/11/2022    3:07 PM  BP/Weight  Systolic BP 113 116 108 115 128 126 121  Diastolic BP 76 80 74 77 82 85 79  Wt. (Lbs) 190.12 199.08 202 201.12 197.9 198 197.12  BMI 32.63 kg/m2 34.17 kg/m2 34.67 kg/m2 34.52 kg/m2 33.97 kg/m2 33.99 kg/m2 33.84 kg/m2

## 2024-07-21 ENCOUNTER — Ambulatory Visit
Admission: RE | Admit: 2024-07-21 | Discharge: 2024-07-21 | Disposition: A | Discharge status: Home / Self Care | Source: Ambulatory Visit | Attending: Family Medicine | Admitting: Family Medicine

## 2024-07-21 ENCOUNTER — Telehealth: Payer: Self-pay

## 2024-07-21 DIAGNOSIS — Z1231 Encounter for screening mammogram for malignant neoplasm of breast: Secondary | ICD-10-CM

## 2024-07-21 NOTE — Telephone Encounter (Signed)
 Spoke with patient and confirmed appointment on 10/3

## 2024-07-22 ENCOUNTER — Inpatient Hospital Stay: Payer: BC Managed Care – PPO | Attending: Hematology and Oncology | Admitting: Hematology and Oncology

## 2024-07-22 VITALS — BP 110/80 | HR 100 | Temp 97.9°F | Resp 18 | Ht 64.0 in | Wt 193.5 lb

## 2024-07-22 DIAGNOSIS — G4730 Sleep apnea, unspecified: Secondary | ICD-10-CM | POA: Diagnosis not present

## 2024-07-22 DIAGNOSIS — Z7984 Long term (current) use of oral hypoglycemic drugs: Secondary | ICD-10-CM | POA: Insufficient documentation

## 2024-07-22 DIAGNOSIS — N6092 Unspecified benign mammary dysplasia of left breast: Secondary | ICD-10-CM | POA: Insufficient documentation

## 2024-07-22 DIAGNOSIS — I10 Essential (primary) hypertension: Secondary | ICD-10-CM | POA: Diagnosis not present

## 2024-07-22 DIAGNOSIS — Z794 Long term (current) use of insulin: Secondary | ICD-10-CM | POA: Insufficient documentation

## 2024-07-22 DIAGNOSIS — Z79810 Long term (current) use of selective estrogen receptor modulators (SERMs): Secondary | ICD-10-CM | POA: Diagnosis not present

## 2024-07-22 DIAGNOSIS — E119 Type 2 diabetes mellitus without complications: Secondary | ICD-10-CM | POA: Diagnosis not present

## 2024-07-22 DIAGNOSIS — Z79899 Other long term (current) drug therapy: Secondary | ICD-10-CM | POA: Diagnosis not present

## 2024-07-22 DIAGNOSIS — Z86018 Personal history of other benign neoplasm: Secondary | ICD-10-CM | POA: Diagnosis not present

## 2024-07-22 MED ORDER — TAMOXIFEN CITRATE 10 MG PO TABS: 5.0000 mg | ORAL_TABLET | Freq: Every day | ORAL | 1 refills | Status: AC

## 2024-07-22 NOTE — Progress Notes (Signed)
 [facility] CONSULT NOTE  Patient Care Team: Antonetta Rollene BRAVO, MD as PCP - General Fields, Margo CROME, MD (Inactive) as Consulting Physician (Gastroenterology) Livingston Rigg, MD as Consulting Physician (Dermatology)  CHIEF COMPLAINTS/PURPOSE OF CONSULTATION:   High risk for breast cancer.  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 Discussed the benefits of tamoxifen  for risk reduction of breast cancer, including new data on low-dose tamoxifen .  Continues tamoxifen  without issues. No new family cancer history. Mammogram results pending. Managed heavy menstrual bleeding with birth control. - Continue tamoxifen  5 mg daily. - Refill tamoxifen  prescription at the same pharmacy. - Notify her of mammogram results when available. - Advise monitoring for blood clot symptoms and report leg swelling, cramping, or sudden chest pain. - Instruct to inform gynecologist about tamoxifen  if heavy menstrual bleeding recurs. - Discussed tamoxifen  side effects, including rare uterine cancer risk and blood clots, and potential uterine lining thickening leading to increased bleeding. She will discuss with Dr Rutherford if she has increased bleeding.  Thank you for consulting us  the care of this patient.  Please not hesitate to contact us  with any additional questions or concerns.  HISTORY OF PRESENTING ILLNESS:  Kaylee Montgomery 42 y.o. female is here because of ADH  Chronology  Mammogram April 2022 which showed possible mass and 2 groups of calcifications in the left breast. Further diagnostic mammo showed multiple groups of suspicious calcifications in the lateral to lower left breast spanning approximately 8 cm.  1 of  these groups of calcifications is associated with the mass which does not have a sonographic correlate.  No evidence of left axillary lymphadenopathy. Biopsy done in May 2022 from the left lower outer quadrant showed atypical lobular hyperplasia second biopsy from the left lower outer mid showed atypical ductal hyperplasia. She underwent left breast lumpectomy with radioactive seed localization x2 on August 1 by Dr. Curvin This showed focal atypical ductal hyperplasia from the left inferior lateral breast.  Left superior lateral breast excision showed focal atypical lobular hyperplasia.  There is intraductal papilloma with calcifications noted in the left deep margin of the superior lateral breast.  No evidence of malignancy. She is referred to high risk breast clinic given her atypical ductal hyperplasia and for further recommendations.  Age at menarche :71 First child birth at birth:15 Use of OCP: 15 yrs No breast cancer in the family No ovarian cancer in the family.  She has DM, HTN at baseline, well controlled according to the patient.  Interval History  Discussed the use of AI scribe software for clinical note transcription with the patient, who gave verbal consent to proceed.  History of Present Illness Kaylee Montgomery is a 43 year old female who presents for ADH follow-up and medication refill.  She is currently taking tamoxifen  at a dose of 5 mg daily, which she tolerates well without any issues. She confirms adherence to the medication regimen.  She underwent a mammogram yesterday, but the results are not yet available. She performs regular breast self-exams and reports no issues.  Earlier this year, she experienced extremely heavy menstrual bleeding after discontinuing birth control. She resumed birth control to manage the bleeding. She is unsure if her current healthcare providers are aware of her tamoxifen  use.  No recent  migraines and no new family history of cancer.  She has lost some weight recently and works from home.  Rest of the pertinent 10 point ROS reviewed and negative.  MEDICAL HISTORY:  Past Medical History:  Diagnosis Date   Anxiety    Breast mass 02/2017   Complication of anesthesia    Dental crown present    Diabetes mellitus without complication (HCC)    Hypertension    states BP fluctuates; has been on med. x 1 yr.   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    SURGICAL HISTORY: Past Surgical History:  Procedure Laterality Date   BALLOON DILATION N/A 01/14/2022   Procedure: BALLOON DILATION;  Surgeon: Cindie Carlin POUR, DO;  Location: [facility];  Service: Endoscopy;  Laterality: N/A;   BIOPSY  01/14/2022   Procedure: BIOPSY;  Surgeon: Cindie Carlin POUR, DO;  Location: [facility];  Service: Endoscopy;;   BREAST EXCISIONAL BIOPSY Left 2018   Negative   BREAST LUMPECTOMY WITH RADIOACTIVE SEED AND SENTINEL LYMPH NODE BIOPSY Right 08/21/2022   Procedure: RIGHT BREAST LUMPECTOMY WITH RADIOACTIVE SEED X1;  Surgeon: Curvin Deward MOULD, MD;  Location[facility] OR;  Service: General;  Laterality: Right;   BREAST LUMPECTOMY WITH RADIOACTIVE SEED LOCALIZATION Left 03/05/2017   Procedure: LEFT BREAST LUMPECTOMY WITH RADIOACTIVE SEED LOCALIZATION;  Surgeon: Belinda Cough, MD;  Location: [facility];  Service: General;  Laterality: Left;   BREAST LUMPECTOMY WITH RADIOACTIVE SEED LOCALIZATION Left 05/20/2021   Procedure: LEFT BREAST LUMPECTOMY WITH RADIOACTIVE SEED LOCALIZATION X2;  Surgeon: Curvin Deward MOULD, MD;  Location: [facility];  Service: General;  Laterality: Left;   BREAST LUMPECTOMY WITH RADIOACTIVE SEED LOCALIZATION Left 08/21/2022   Procedure: LEFT BREAST LUMPECTOMY WITH RADIOACTIVE SEED LOCALIZATION SEEDS X2;  Surgeon: Curvin Deward MOULD, MD;   Location: [facility] OR;  Service: General;  Laterality: Left;   BREAST REDUCTION SURGERY Bilateral 08/21/2022   Procedure: BILATERAL MAMMOPLASTY REDUCTION  (BREAST);  Surgeon: Contogiannis, Ronal Caldron, MD;  Location: [facility] OR;  Service: Plastics;  Laterality: Bilateral;   CESAREAN SECTION     CHOLECYSTECTOMY  08/2017   [facility]   ESOPHAGOGASTRODUODENOSCOPY  07/21/2017   [facility]: Dr. Waverly. healthy appearing cyst cavity without necrotic material that was decreased in size. Removal of stent scheduled for 11/12.    ESOPHAGOGASTRODUODENOSCOPY (EGD) WITH PROPOFOL  N/A 01/14/2022   Procedure: ESOPHAGOGASTRODUODENOSCOPY (EGD) WITH PROPOFOL ;  Surgeon: Cindie Carlin POUR, DO;  [facility]: [facility];  Service: Endoscopy;  Laterality: N/A;  2:15 ASA 2.   EUS  06/19/2017   [facility]: 7 x 7.3 cm cyst with necrotic material noted adjacent to the body of the pancreas. FNA without malignancy.   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)   REDUCTION MAMMAPLASTY Bilateral 08/21/2022   TONSILLECTOMY  age 76    SOCIAL HISTORY: Social History   Socioeconomic History   Marital status: Single    Spouse name: Not on file   Number of children: 1   Years of education: Not on file   Highest  education level: Master's degree (e.g., MA, MS, MEng, MEd, MSW, MBA)  Occupational History   Occupation: STUDENT    Occupation: CASEWORKER    Employer: [employer]  Tobacco Use   Smoking status: Never   Smokeless tobacco: Never  Vaping Use   Vaping status: Never Used  Substance and Sexual Activity   Alcohol use: No    Alcohol/week: 0.0 standard drinks of alcohol    Comment: never drank regularly or heavily but NO etoh at all since 01/2017   Drug use: No    Sexual activity: Yes    Birth control/protection: None  Other Topics Concern   Not on file  Social History Narrative   Patient is right handed.   Patient drinks 1-2 cups of caffeine  daily.   Social Drivers of Corporate investment banker Strain: Low Risk  (06/13/2024)   Overall Financial Resource Strain (CARDIA)    Difficulty of Paying Living Expenses: Not very hard  Food Insecurity: No Food Insecurity (06/13/2024)   Hunger Vital Sign    Worried About Running Out of Food in the Last Year: Never true    Ran Out of Food in the Last Year: Never true  Transportation Needs: No Transportation Needs (06/13/2024)   PRAPARE - Administrator, Civil Service (Medical): No    Lack of Transportation (Non-Medical): No  Physical Activity: Sufficiently Active (06/13/2024)   Exercise Vital Sign    Days of Exercise per Week: 3 days    Minutes of Exercise per Session: 60 min  Stress: No Stress Concern Present (06/13/2024)   [employer] of Occupational Health - Occupational Stress Questionnaire    Feeling of Stress: Only a little  Social Connections: Moderately Isolated (06/13/2024)   Social Connection and Isolation Panel    Frequency of Communication with Friends and Family: More than three times a week    Frequency of Social Gatherings with Friends and Family: Twice a week    Attends Religious Services: More than 4 times per year    Active Member of [employer] or Organizations: No    Attends Engineer, structural: Not on file    Marital Status: Never married  Catering manager Violence: Not on file    FAMILY HISTORY: Family History  Problem Relation Age of Onset   Heart disease Father        stent   Migraines Father    Diabetes Maternal Grandfather    Migraines Son    Colon cancer Neg Hx    Breast cancer Neg Hx     ALLERGIES:  is allergic to bromocriptine mesylate, depo-medrol  [methylprednisolone  sodium succ], dilaudid  [hydromorphone  hcl], and imitrex   [sumatriptan ].  MEDICATIONS:  Current Outpatient Medications  Medication Sig Dispense Refill   amLODipine  (NORVASC ) 10 MG tablet Take 1 tablet (10 mg total) by mouth daily. 30 tablet 5   ascorbic acid (VITAMIN C) 500 MG tablet Take 500 mg by mouth 2 (two) times daily.     atorvastatin  (LIPITOR) 20 MG tablet Take 1 tablet (20 mg total) by mouth daily. 90 tablet 3   BD PEN NEEDLE NANO 2ND GEN 32G X 4 MM MISC Inject into the skin daily.     cyclobenzaprine  (FLEXERIL ) 10 MG tablet TAKE 1 TABLET(10 MG) BY MOUTH AT BEDTIME 90 tablet 3   Empagliflozin-metFORMIN  HCl ER 25-1000 MG TB24 Take 1 tablet by mouth daily.     gabapentin  (NEURONTIN ) 300 MG capsule TAKE 1 CAPSULE(300 MG) BY MOUTH AT BEDTIME 90 capsule 3  glucose blood (ONETOUCH VERIO) test strip Use as instructed bid. E11.65 100 each 5   insulin  degludec (TRESIBA) 100 UNIT/ML FlexTouch Pen Inject 16 Units into the skin at bedtime.     Insulin  Pen Needle (BD PEN NEEDLE NANO 2ND GEN) 32G X 4 MM MISC See admin instructions.     Lancets MISC 1 each by Does not apply route 2 (two) times daily. 100 each 5   Metoprolol  Tartrate 75 MG TABS TAKE 1 TABLET BY MOUTH EVERY DAY 90 tablet 3   MOUNJARO 2.5 MG/0.5ML Pen      norethindrone  (MICRONOR) 0.35 MG tablet TAKE 1 TABLET(0.35 MG) BY MOUTH DAILY 28 tablet 5   tamoxifen  (NOLVADEX ) 10 MG tablet Take 0.5 tablets (5 mg total) by mouth daily. 90 tablet 1   Vitamin D , Ergocalciferol , (DRISDOL ) 1.25 MG (50000 UNIT) CAPS capsule TAKE 1 CAPSULE BY MOUTH EVERY 7 DAYS 12 capsule 1   Multiple Vitamin (MULTIVITAMIN WITH MINERALS) TABS tablet Take 1 tablet by mouth in the morning and at bedtime. (Patient not taking: Reported on 07/22/2024)     No current facility-administered medications for this visit.   PHYSICAL EXAMINATION: ECOG PERFORMANCE STATUS: 0 - Asymptomatic  BP 110/80   Pulse 100   Temp 97.9 F (36.6 C) (Temporal)   Resp 18   Ht 5' 4 (1.626 m)   Wt 193 lb 8 oz (87.8 kg)   SpO2 100%   BMI 33.21  kg/m    Physical Exam Constitutional:      Appearance: Normal appearance.  HENT:     Head: Normocephalic and atraumatic.  Chest:  Breasts:    Left: No skin change.     Comments: Bilateral breast post breast reduction.  No palpable masses.  Surgical scarring noted.  No regional adenopathy. Musculoskeletal:        General: No swelling or tenderness.     Cervical back: Normal range of motion and neck supple. No rigidity.  Lymphadenopathy:     Cervical: No cervical adenopathy.  Neurological:     Mental Status: She is alert.  Psychiatric:        Mood and Affect: Mood normal.     LABORATORY DATA:  I have reviewed the data as listed Lab Results  Component Value Date   WBC 6.4 12/16/2023   HGB 12.8 12/16/2023   HCT 38.9 12/16/2023   MCV 78 (L) 12/16/2023   PLT 324 12/16/2023     Chemistry      Component Value Date/Time   NA 142 12/16/2023 1504   K 3.9 12/16/2023 1504   CL 105 12/16/2023 1504   CO2 22 12/16/2023 1504   BUN 9 12/16/2023 1504   CREATININE 0.78 12/16/2023 1504   CREATININE 0.56 03/11/2019 1013      Component Value Date/Time   CALCIUM  9.8 12/16/2023 1504   ALKPHOS 41 (L) 10/13/2023 1230   AST 12 10/13/2023 1230   ALT 11 10/13/2023 1230   BILITOT 0.8 10/13/2023 1230       RADIOGRAPHIC STUDIES: I have personally reviewed the radiological images as listed and agreed with the findings in the report. No results found.   SURGICAL PATHOLOGY  CASE: [id number]  PATIENT: KERBY BURR  Surgical Pathology Report    Clinical History: Left breast atypical ductal hyperplasia (nt)   FINAL MICROSCOPIC DIAGNOSIS:   A. BREAST, LEFT INFERIOR-LATERAL, EXCISION:  - Focal atypical ductal hyperplasia.  - Biopsy site.   B. BREAST, LEFT SUPERIOR-LATERAL, EXCISION:  - Focal atypical lobular hyperplasia.  -  Biopsy site.   C. BREAST, LEFT DEEP MARGIN OF SUPERIOR-LATERAL, EXCISION:  - Intraductal papilloma with calcifications.  - Fibrocystic change and  columnar cell hyperplasia.  - Biopsy site.   All questions were answered. The patient knows to call the clinic with any problems, questions or concerns.      Amber Stalls, MD 07/22/2024 9:56 AM

## 2024-08-05 ENCOUNTER — Ambulatory Visit: Payer: Self-pay

## 2024-08-05 NOTE — Telephone Encounter (Signed)
Mailed normal results

## 2024-08-15 ENCOUNTER — Ambulatory Visit (INDEPENDENT_AMBULATORY_CARE_PROVIDER_SITE_OTHER)

## 2024-08-15 DIAGNOSIS — Z23 Encounter for immunization: Secondary | ICD-10-CM | POA: Diagnosis not present

## 2024-08-15 NOTE — Progress Notes (Signed)
 Patient is in office today for a nurse visit for Immunization. Patient Injection was given in the  Right deltoid. Patient tolerated injection well.

## 2024-09-12 ENCOUNTER — Other Ambulatory Visit: Payer: Self-pay | Admitting: Family Medicine

## 2024-09-19 ENCOUNTER — Encounter (INDEPENDENT_AMBULATORY_CARE_PROVIDER_SITE_OTHER): Payer: Self-pay | Admitting: *Deleted

## 2024-12-15 ENCOUNTER — Ambulatory Visit

## 2024-12-16 ENCOUNTER — Ambulatory Visit: Payer: Self-pay | Admitting: Family Medicine

## 2025-01-20 ENCOUNTER — Other Ambulatory Visit

## 2025-01-20 ENCOUNTER — Ambulatory Visit: Admitting: Hematology and Oncology
